# Patient Record
Sex: Male | Born: 1955 | Race: White | Hispanic: No
Health system: Midwestern US, Community
[De-identification: ages and names within clinical notes are randomized; demographics above are authoritative.]

## PROBLEM LIST (undated history)

## (undated) VITALS — BP 110/79 | HR 90 | Temp 97.80000°F | Resp 16 | Ht 68.74 in | Wt 191.1 lb

## (undated) DIAGNOSIS — J45909 Unspecified asthma, uncomplicated: Secondary | ICD-10-CM

## (undated) DIAGNOSIS — I2510 Atherosclerotic heart disease of native coronary artery without angina pectoris: Secondary | ICD-10-CM

## (undated) DIAGNOSIS — C801 Malignant (primary) neoplasm, unspecified: Secondary | ICD-10-CM

## (undated) DIAGNOSIS — I10 Essential (primary) hypertension: Secondary | ICD-10-CM

## (undated) DIAGNOSIS — M1990 Unspecified osteoarthritis, unspecified site: Secondary | ICD-10-CM

## (undated) DIAGNOSIS — C9000 Multiple myeloma not having achieved remission: Principal | ICD-10-CM

## (undated) DIAGNOSIS — J209 Acute bronchitis, unspecified: Secondary | ICD-10-CM

## (undated) DIAGNOSIS — R059 Cough, unspecified: Secondary | ICD-10-CM

## (undated) DIAGNOSIS — J449 Chronic obstructive pulmonary disease, unspecified: Principal | ICD-10-CM

## (undated) DIAGNOSIS — E859 Amyloidosis, unspecified: Secondary | ICD-10-CM

## (undated) DIAGNOSIS — J189 Pneumonia, unspecified organism: Secondary | ICD-10-CM

## (undated) DIAGNOSIS — C7951 Secondary malignant neoplasm of bone: Principal | ICD-10-CM

## (undated) DIAGNOSIS — M549 Dorsalgia, unspecified: Secondary | ICD-10-CM

## (undated) DIAGNOSIS — Z52011 Autologous donor, stem cells: Secondary | ICD-10-CM

## (undated) DIAGNOSIS — R0689 Other abnormalities of breathing: Secondary | ICD-10-CM

## (undated) DIAGNOSIS — R0600 Dyspnea, unspecified: Secondary | ICD-10-CM

## (undated) DIAGNOSIS — M25552 Pain in left hip: Secondary | ICD-10-CM

## (undated) DIAGNOSIS — R911 Solitary pulmonary nodule: Secondary | ICD-10-CM

---

## 2016-03-12 NOTE — Telephone Encounter (Addendum)
On 7.13.17 sent fax to Dr. Liliane Shi office for records.   Appt is this Monday, 7.17.17 w/Dr. Christene Lye.     **update**  Indexed records from Dr. Canary Brim and [facility].  Beta2 lab in [facility] is still in process.  Will check back on Monday Morning.     **update 7.17 @ 7:30am**    Indexed Beta2 lab.  Chart complete.

## 2016-03-12 NOTE — Telephone Encounter (Signed)
Np Dx Multiple Myeloma; Dr. Bernette Redbird [phone]; [employer] PPO; Verbal permission  for records, per Ainsley Spinner all records with PCP office & Dr. Remonia Richter location); appointment 03/16/16 9:15AM lab & a 9:30 AM Consult. [facility]h

## 2016-03-12 NOTE — Telephone Encounter (Signed)
New pt appt noted

## 2016-03-16 ENCOUNTER — Encounter: Admit: 2016-03-16 | Discharge: 2016-03-16 | Payer: BLUE CROSS/BLUE SHIELD | Primary: Hematology & Oncology

## 2016-03-16 ENCOUNTER — Ambulatory Visit
Admit: 2016-03-16 | Discharge: 2016-03-16 | Payer: BLUE CROSS/BLUE SHIELD | Attending: Hematology & Oncology | Primary: Hematology & Oncology

## 2016-03-16 ENCOUNTER — Encounter: Admit: 2016-03-16 | Primary: Hematology & Oncology

## 2016-03-16 ENCOUNTER — Inpatient Hospital Stay: Attending: Hematology & Oncology | Primary: Hematology & Oncology

## 2016-03-16 DIAGNOSIS — C9000 Multiple myeloma not having achieved remission: Secondary | ICD-10-CM

## 2016-03-16 LAB — CBC WITH AUTO DIFFERENTIAL
Granulocytes %: 62.4 % (ref 37.0–80.0)
Granulocytes: 4.6 10*3/uL (ref 2.0–7.8)
Hematocrit: 42.4 % — ABNORMAL LOW (ref 43.5–53.7)
Hemoglobin: 12.9 g/dL — ABNORMAL LOW (ref 14.1–18.1)
Lymphocyte %: 29.9 % (ref 10.0–50.0)
Lymphocytes: 2.2 10*3/uL (ref 0.6–4.1)
MCH: 28.7 pg (ref 27.0–31.2)
MCHC: 30.4 g/dL — ABNORMAL LOW (ref 31.8–35.4)
MCV: 94.4 fL (ref 80.0–97.0)
Mid Cells %: 7.7 % (ref 0.1–24.0)
Mid Cells: 0.6 10*3/uL (ref 0.0–1.8)
Platelets: 347 10*3/uL (ref 142–424)
RBC: 4.49 M/uL — ABNORMAL LOW (ref 4.69–6.13)
RDW: 13.9 % (ref 11.6–14.8)
WBC: 7.4 10*3/uL (ref 4.6–10.2)

## 2016-03-16 MED ORDER — LORAZEPAM 2 MG/ML IJ SOLN
2 MG/ML | Freq: Once | INTRAMUSCULAR | Status: AC
Start: 2016-03-16 — End: 2016-03-16
  Administered 2016-03-16: 15:00:00 1 mg via INTRAVENOUS

## 2016-03-16 MED ORDER — OXYCODONE-ACETAMINOPHEN 7.5-325 MG PO TABS
7.5-325 | ORAL_TABLET | ORAL | 0 refills | Status: DC | PRN
Start: 2016-03-16 — End: 2016-04-06

## 2016-03-16 MED ORDER — MORPHINE SULFATE ER 15 MG PO TBCR
15 MG | ORAL_TABLET | Freq: Two times a day (BID) | ORAL | 0 refills | Status: AC
Start: 2016-03-16 — End: 2016-04-15

## 2016-03-16 MED FILL — LORAZEPAM 2 MG/ML IJ SOLN: 2 MG/ML | INTRAMUSCULAR | Qty: 0.5

## 2016-03-16 NOTE — Progress Notes (Signed)
1123- Per dr Al Corpus pt needs 1 mg IV ativan before bone marrow. Pt states is very anxious. Iv placed in RAC, good blood return noted. Additional labs drawn. Ativan given through free flowing NS. Blood return satisfactory. IV left in place in case additional meds needed. Informed pt may cause drowsiness, verbalizes understanding   1211- IV removed without difficulty. Site unremarkable, dressing applied.     Patient Condition:  alert and oriented    Access Statements  An occlusive sterile dressing was applied after PIV established  IV fluid infuse well via pump    IV Push ended at 1123     Discharge [facility] discharge accompanied by Self and Spouse    Mode of transportation: Ambulatory    Clinic Discharge Note:  Treatment was admistered as prescribed without adverse event, with physician oversight.  Universal Precautions observed with treatment.  Patient instructed to call office with any questions or problems. Discharge Teaching instruction  Patient Physical Status adequate  Verify on flowchart all medications due today have been administered and documented.  Return appointment scheduled  Discharged from clinic

## 2016-03-16 NOTE — Patient Instructions (Signed)
Clinic Discharge Note     Clinic Discharge Note: Treatment was administered as prescribed without adverse event, with physician oversight.;Universal precautions observed with treatment;Patient instructed to call office with any questions or problems.  Discharge Teaching Instructions.;Verify on MAR all medications due today have been administered and documented on.;Return appointment scheduled;Discharged from clinic.    Patients Receiving  Treatment:  Report to your physician's office immediately or call  [phone]:              1.  Fever of 100.5 or greater.                2.  Vomiting without relief after using anti-nausea/vomiting medicine.                3.  Severe abdominal cramping/diarrhea, 3-5 watery bowel movements in a day.                4.  Pain at the IV site where you received your chemotherapy.                5.  Unusual or excessive bleeding from your mouth, nose, rectum, or vagina;  blood in your urine.                6.  A sudden onset of shortness of breath or chest pain.    Report to your physician's office within 24 hours:              1.  Pain not relieved by your usual pain medication.                2.  Signs of infection; redness, warmth, or swelling.                3.  Unusual bruising of any body area.                4.  Change in breathing patterns such as difficulty breathing or shortness of breath when you are resting.                5.  Change in urination; cloudiness, odor, frequency, or pain on urination.    Report to your physician's office within 48 hours:              1.  Skin changes: rash, hives, itching, redness and peeling of the skin.                2.  Numbness, tingling in the extremities or a persistent ringing in the ears.    Practice good personal hygiene.  If possible, avoid persons with colds or flu-like symptoms.  Drink plenty of fluids while awake and avoid exposure to the sun.  It is not unusual to experience other side effects, such as loss of appetite, altered  taste sensation or depression.

## 2016-03-16 NOTE — Progress Notes (Signed)
[facility] CONSULTATION     Date: 03/16/2016  Time: 8:05 PM    PCP: Donia Guiles, DO  Referring Provider: Mart Piggs, MD  Oncologist: Dr. Eulis Canner, M.D.; M.S.    Reason for Consult:   Lytic lesions in a pain    CHIEF COMPLAINT:     Back pain for 6 months.    HPI:     David Watkins is a 60 y.o. male here today for evaluation of newly discovered lytic lesions.   He presented with cough at the beginning of this year.  He also developed right lower rib pain.  He was later seen by his primary care physician.  Because of the persistent pain he was sent to Dr. Canary Brim.  He was given steroids.  His initial x-ray was reportedly negative.  However his cough did not go away and his pain got worse. CT of the chest at the [facility] on 03/10/2016 showed a 3 mm nodule in the periphery of the left lower lobe.  There is a 3.5 x 3 cm soft tissue mass with bony destruction involving posterior elements of the T6 vertebral body with extension into the thoracic canal with at least 50% narrowing of the diameter.  There are 2 lytic lesions within T11 vertebral body and the left pedicle with mild extent expansion in the less than 25% compromise of the thoracic canal.  There is a 2.3 x 2.3 destructive lesion in the inferolateral aspect of the scapula.  Right seventh and eighth ribs showed expansile lesions in the left 2, 3, 7 and 8 showed expansile lesions.  His pain is not relieved with Percocet.    HISTORY:     PAST MEDICAL HISTORY:  Past Medical History:   Diagnosis Date   ??? Multiple myeloma ([place])        PAST SURGICAL HISTORY:  Past Surgical History:   Procedure Laterality Date   ??? BONE MARROW BIOPSY         FAMILY HISTORY:  Family History   Problem Relation Age of Onset   ??? Heart Disease Mother    ??? Lung Cancer Father    ??? Heart Attack Brother        SOCIAL HISTORY:  History   Drug Use Not on file     History   Alcohol use Not on file     History   Sexual Activity   ??? Sexual activity: Not on file     History   Smoking Status   ??? Former  Smoker   ??? Quit date: 11/25/2012   Smokeless Tobacco   ??? Never Used       He works as a IT trainer.    ALLERGIES:     Allergies   Allergen Reactions   ??? Ceclor [Cefaclor] Hives       CURRENT MEDICATIONS:     Current Outpatient Prescriptions   Medication Sig Dispense Refill   ??? lisinopril (PRINIVIL;ZESTRIL) 10 MG tablet Take 10 mg by mouth daily     ??? atorvastatin (LIPITOR) 10 MG tablet Take 10 mg by mouth daily     ??? oxyCODONE-acetaminophen (PERCOCET) 7.5-325 MG per tablet Take 1 tablet by mouth every 4 hours as needed for Pain . 90 tablet 0   ??? morphine (MS CONTIN) 15 MG extended release tablet Take 1 tablet by mouth 2 times daily . 60 tablet 0     No current facility-administered medications for this visit.  PROBLEM LIST:     Patient Active Problem List   Diagnosis   ??? Multiple myeloma [facility])     Specialty Problems        Oncology Problems    Multiple myeloma ([facility])              REVIEW OF SYSTEMS:     CONSTITUTIONAL: Denies fever, sweats.  Some weight loss. Severe pain.      EYES: No visual changes or diplopia. No scleral icterus.  ENT: No Headaches, hearing loss or vertigo. No mouth sores or sore throat.  CARDIOVASCULAR: No chest pain, dyspnea on exertion, palpitations or loss of consciousness.   RESPIRATORY: No cough or wheezing, no sputum production. No hemoptysis.    GASTROINTESTINAL: No abdominal pain, appetite loss, blood in stools.  Mild constipation with use of Percocet.  GENITOURINARY: No dysuria, trouble voiding, or hematuria.  MUSCULOSKELETAL: no generalized weakness.  Mid back pain in the rib pain.  INTEGUMENTARY: No rash or pruritus.  NEUROLOGICAL: No headache, diplopia. No change in gait, balance, or coordination. No paresthesia.  ENDOCRINE: No temperature intolerance. No excessive thirst, fluid intake, or urination.   HEMATOLOGIC/LYMPHATIC: No abnormal bruising or ecchymosis, blood clots or swollen lymph nodes.  ALLERGIC/IMMUNOLOGIC: No nasal congestion or hives.     PHYSICAL EXAM:     BP  138/76   Pulse 82   Temp 97.8 ??F (36.6 ??C) (Oral)    Resp 16   Ht 5' 9" (1.753 m)   Wt 203 lb (92.1 kg)   BMI 29.98 kg/m2    WEIGHT:   Wt Readings from Last 3 Encounters:   03/16/16 203 lb (92.1 kg)        GENERAL APPEARANCE: Alert and Cooperative.  In pain.   HEAD: Normocephalic, without obvious abnormality, atraumatic  NECK: No palpable lymphadenopathy in supraclavicular, axillary or cervical chains  LUNGS: Clear to auscultation bilaterally, no audible rales, wheezes or crackles  HEART: Regular rate and rhythm, S1, S2 normal  ABDOMEN: Soft, non-tender; bowel sounds normal; no masses, no organomegaly  EXTREMITIES: without cyanosis, clubbing, edema or asymmetry   SKIN: No jaundice, purpura or petechiae  Tenderness on his mid back at T6 and the T11.  Also lower rib tenderness.  No focal neurologic deficit.    LABS:     CBC:  Lab Results   Component Value Date    WBC 7.4 03/16/2016    HGB 12.9 (L) 03/16/2016    HCT 42.4 (L) 03/16/2016    MCV 94.4 03/16/2016    PLT 347 03/16/2016    LABLYMP 2.2 03/16/2016    MID 0.6 03/16/2016    GRAN 4.6 03/16/2016    LYMPHOPCT 29.9 03/16/2016    MIDPERCENT 7.7 03/16/2016    GRANULOCYTES 62.4 03/16/2016    RBC 4.49 (L) 03/16/2016    MCH 28.7 03/16/2016    MCHC 30.4 (L) 03/16/2016    RDW 13.9 03/16/2016       Lab Results   Component Value Date    NA 133 03/16/2016    K 4.5 03/16/2016    CL 98 03/16/2016    CO2 29 03/16/2016    BUN 15 03/16/2016    CREATININE 1.2 03/16/2016    GLUCOSE 109 03/16/2016    CALCIUM 8.8 03/16/2016    PROT 7.7 03/16/2016    PROT 9.8 (H) 03/16/2016    LABALBU 3.6 03/16/2016    BILITOT 0.7 03/16/2016    ALKPHOS 82 03/16/2016    AST 42 (H) 03/16/2016  ALT 30 03/16/2016    LABGLOM >60.0 03/16/2016       No results found for: PTINR    TUMOR MARKERS:    Lab Results   Component Value Date    PSA 0.7 03/16/2016       IMAGING:     Xr Skeletal Survey Complete    Result Date: 03/16/2016  EXAMINATION: COMPLETE BONE SURVEY 03/16/2016 1:19 pm COMPARISON: None. HISTORY:  ORDERING PHYSICIAN PROVIDED HISTORY: Multiple myeloma, remission status unspecified ([place]) TECHNOLOGIST PROVIDED HISTORY: Technologist Provided Reason for Exam: Multiple myeloma, remission status unspecified ([place]) Acuity: Unknown Type of Encounter: Unknown FINDINGS: The following x-rays were obtained: AP, lateral skull, AP and lateral cervical spine, AP and lateral thoracic spine, frontal chest, AP right and left ribs, AP and lateral lumbar spine, AP pelvis, AP and lateral right and left femurs, AP and lateral right and left tibia fibula, AP right foot, AP left foot, AP right hand, AP left hand, AP right and left humerus, AP right and left forearm. Sinuses are clear.  Septum is midline.  No focal lucent or sclerotic skull lesions are identified.  Mild diffuse degenerative osteophyte formation is seen along the cervical, thoracic and lumbar spine.  Pedicles well visualized without focal lucent or sclerotic lesion identified.  Left 3rd rib fracture is demonstrated.  Left 8th rib demonstrates an expansile lucent lesion. Right 4th rib fracture is demonstrated.  There is a focal expansile lucent lesion involving the right scapular body.  Spondylolysis is noted at L5 with severe disc height loss at L5-S1.  Bones of the pelvis demonstrate no focal lucent or sclerotic lesions.  The femurs are intact.  Lower extremities are intact.  The feet demonstrate no lucent or sclerotic lesions.  The right proximal humerus demonstrates a subtle lucency which may represent a focal myeloma deposit.  Severe degenerative spurring is seen about the left elbow. The left humerus is intact.  Left and right forearms demonstrate no focal lucent or sclerotic lesions.  Left and right hands demonstrate no focal lucent or sclerotic lesions.  Clavicles demonstrate no focal lucent or sclerotic lesion.  Lucencies overlying the lumbar vertebral bodies are felt to be secondary to overlying bowel gas.     Expansile lucent lesions are identified involving  the left 3rd and 8th rib and the right scapula.  Left 3rd and right 4th rib fractures are seen. Subtle lucency in the right proximal humerus may represent a focal lesion as well.     CT of the chest at the [facility] on 03/10/2016 showed a 3 mm nodule in the periphery of the left lower lobe.  There is a 3.5 x 3 cm soft tissue mass with bony destruction involving posterior elements of the T6 vertebral body with extension into the thoracic canal with at least 50% narrowing of the diameter.  There are 2 lytic lesions within T11 vertebral body and the left pedicle with mild extent expansion in the less than 25% compromise of the thoracic canal.  There is a 2.3 x 2.3 destructive lesion in the inferolateral aspect of the scapula.  Right seventh and eighth ribs showed expansile lesions in the left 2, 3, 7 and 8 showed expansile lesions.      PATHOLOGY:     No results found for this or any previous visit.    STAGING:     No matching staging information was found for the patient.    ASSESSMENT & PLAN:     Lytic lesions involving T6, T11, ribs, and  the right scapula.  There is also subtle lucency in the right proximal humerus.    Intractable pain.    Normocytic and normochromic anemia.    Significantly elevated total protein level.    Labs including myeloma panel and PSA were obtained today.  His PSA turned out to be normal.    Explained to the patient and his wife David Watkins at great length about the findings.  The patient is very anxious.  We will add MS Contin.  He will continue Percocet as needed.  We discussed the bowel regimen.    Bone marrow aspiration and biopsy were performed at his right posterior iliac crest under local anesthesia and sterile condition.  Light sedation with Ativan was given for procedure.  The specimens were successfully obtained.  He tolerated procedure well without complications.    Because of the claustrophobia open MRI of his thoracic spine was scheduled for tomorrow.  Skeletal survey was done  today.  He will be scheduled to see radiation oncology.    We discussed the potential diagnosis and management.    I discussed with Dr. Dorris Carnes.    Thanks for this interesting referral.       Total length of this encounter = 80 minutes.    Eulis Canner, M.D.; M.S.  Medical Oncology/Hematology  Phone: [phone]  Fax: [phone]    [facility]  [address] [place] [address] [place] [address]    [place] [address]

## 2016-03-17 ENCOUNTER — Telehealth

## 2016-03-17 NOTE — Telephone Encounter (Signed)
David Watkins (Other) called to reschedule a MRI. Test is currently scheduled for today. Call was transferred to Test Scheduling. [facility] is at [facility] he couldn't do the mri still in parking lot calling to see if can reschedule or what options are.  Lost call.

## 2016-03-17 NOTE — Telephone Encounter (Signed)
tamika at [facility] called requesting records, needed today. Requesting clinicals to be faxed to [phone]. null, laura is receiving the records. tamika may be reached at [phone].

## 2016-03-17 NOTE — Telephone Encounter (Signed)
I spoke with David Watkins and explained that if he was unable to complete the MRI in an Open MRI that he may need to be sedated.  Message sent to the office to contact her ASAP

## 2016-03-17 NOTE — Telephone Encounter (Signed)
7/17 Consult faxed.

## 2016-03-17 NOTE — Telephone Encounter (Addendum)
Returned phone call to [employer].  [name] stated the patient was not able to do the MRI this morning due to fear of being claustrophobic.  [name] stated the only way the patient will be able to do this procedure is if he is sedated.    [facility].  Scheduled patient for IV sedation prior to MRI at [facility] tomorrow 7/19 at 10:30.  Faxed over last office note, MRI order and med list to [phone] as requested.    Returned phone call to [employer].  Instructed [name] of the plan of care.  Pt is to arrive 2 hours prior to his procedure.  He is to have nothing to eat or drink after midnight tonight.  [name] verbalized understanding.

## 2016-03-18 ENCOUNTER — Inpatient Hospital Stay: Admit: 2016-03-18 | Attending: Hematology & Oncology | Primary: Hematology & Oncology

## 2016-03-18 ENCOUNTER — Telehealth

## 2016-03-18 DIAGNOSIS — C9000 Multiple myeloma not having achieved remission: Secondary | ICD-10-CM

## 2016-03-18 MED ORDER — HYDROMORPHONE 0.5MG/0.5ML IJ SOLN
1 MG/ML | Status: DC | PRN
Start: 2016-03-18 — End: 2016-03-19

## 2016-03-18 MED ORDER — PROMETHAZINE HCL 25 MG/ML IJ SOLN
25 MG/ML | Freq: Once | INTRAMUSCULAR | Status: AC | PRN
Start: 2016-03-18 — End: 2016-03-18

## 2016-03-18 MED ORDER — LABETALOL HCL 5 MG/ML IV SOLN
5 MG/ML | INTRAVENOUS | Status: DC | PRN
Start: 2016-03-18 — End: 2016-03-19

## 2016-03-18 MED ORDER — DEXAMETHASONE 4 MG PO TABS
4 MG | ORAL_TABLET | Freq: Every day | ORAL | 0 refills | Status: DC
Start: 2016-03-18 — End: 2016-03-20

## 2016-03-18 MED ORDER — GADOVERSETAMIDE 330.9 MG/ML IV SOLN
330.9 MG/ML | Freq: Once | INTRAVENOUS | Status: AC | PRN
Start: 2016-03-18 — End: 2016-03-18
  Administered 2016-03-18: 16:00:00 18 mL via INTRAVENOUS

## 2016-03-18 MED ORDER — FENTANYL CITRATE (PF) 100 MCG/2ML IJ SOLN
100 MCG/2ML | INTRAMUSCULAR | Status: DC | PRN
Start: 2016-03-18 — End: 2016-03-19

## 2016-03-18 MED ORDER — FENTANYL CITRATE (PF) 100 MCG/2ML IJ SOLN
100 MCG/2ML | INTRAMUSCULAR | Status: DC | PRN
Start: 2016-03-18 — End: 2016-03-19
  Administered 2016-03-18: 17:00:00 50 ug via INTRAVENOUS

## 2016-03-18 NOTE — Progress Notes (Signed)
1.Verify patient.  2.Provide d/c instructions.  3.Understanding of d/c instructions.  4.Asess post-op  nausea , vomiting and pain.  5.Maintain core body temperature 96.8 or above.  6.Demonstrates post-op exercises.  7.Receive copy of d/c instructions.  8.Return to Pre anesthetic LOC/sensorimotor activity  9.IV d/c prior to d/c. Site unremarkable  10.Follow standard of care.

## 2016-03-18 NOTE — Progress Notes (Signed)
Medicated per CRNA for c/o rib and back pain

## 2016-03-18 NOTE — Progress Notes (Signed)
Up to chair and ambul. Tol po snack and drink well.

## 2016-03-18 NOTE — Progress Notes (Signed)
1.  Patient is identified using name and date of birth.  2.  The patient is free from signs and symptoms of injury.  3.  The patient receives appropriate medication(s), safely administered during the perioperative period.  4.  The patient had wound/tissuue perfusion consistent with or improved from baseline levels established preoperatively.  5.  The patient is at or returning to normothermia at the conclusion of the immediate postoperative period.  6.  The patient's fluid, electrolyte, and acid base balances are consistent with or improved from baseline levels established preoperatively.  7.  The patient's pulmonary function is consistent with or improved from baseline levels established preoperatively.  8.  The patient's cardiovascular status is consistent with or improved from baseline levels established preoperatively.  9.  The patient/caregiver participates in decisions affecting his or her perioperative care.  10.  The patient's care is consistent with the individualized perioperative plan of care.  11.  The patient's right to privacy is maintained.  12.  The patient is the recipient of competent and ethical care within legal standards of practice.  13.  The patient's value system, lifestyle, ethnicity, and culture are considered, respected, and incorporated in the perioperative plan of care.  14.  The patient demonstrates and/or reports adequate pain control throughout the perioperative period.  15.  The patient's neurological status is consistent with or improved from baseline levels established preoperatively.  16.  The patient/caregiver demonstrates knowledge of the expected responses to the operative or invasive procedure.  17.  Patient/caregiver has reduced anxiety.  Interventions - familiarize with environment and equipment.

## 2016-03-18 NOTE — Progress Notes (Signed)
Discharged to Phase 2

## 2016-03-18 NOTE — Telephone Encounter (Signed)
Called patient in regards to his MRI results.  Instructed the patient per Dr. Al Corpus that his results show multiple myeloma.  A new Rx for dexamethasone 44m x 4days will be called in to his pharmacy to help with the inflammation.  Scheduled patient for OCM this Friday [facility]20[facility]16 [facility] 8[facility]45[facility] LAmedeo KinsmanAPP.  Pt verbalized understanding.

## 2016-03-18 NOTE — Anesthesia Pre-Procedure Evaluation (Signed)
Department of Anesthesiology  Preprocedure Note       Name:  David Watkins   Age:  60 y.o.  DOB:  08/01/1956                                          MRN:  [id number]         Date:  03/18/2016        [facility] of Anesthesiology  Pre-Anesthesia Evaluation/Consultation       Name:  David Watkins                                         Age:  60 y.o.  MRN:  [id number]           Procedure (Scheduled):  MRI thoracic spine  Surgeon:  Dr. Wynetta Emery     Allergies   Allergen Reactions   ??? Ceclor [Cefaclor] Hives     Patient Active Problem List   Diagnosis   ??? Multiple myeloma (HCC)     Past Medical History:   Diagnosis Date   ??? Multiple myeloma ([place])      Past Surgical History:   Procedure Laterality Date   ??? BONE MARROW BIOPSY       Social History   Substance Use Topics   ??? Smoking status: Former Smoker     Quit date: 11/25/2012   ??? Smokeless tobacco: Never Used   ??? Alcohol use None     Medications  Current Outpatient Prescriptions on File Prior to Encounter   Medication Sig Dispense Refill   ??? lisinopril (PRINIVIL;ZESTRIL) 10 MG tablet Take 10 mg by mouth daily     ??? atorvastatin (LIPITOR) 10 MG tablet Take 10 mg by mouth daily     ??? oxyCODONE-acetaminophen (PERCOCET) 7.5-325 MG per tablet Take 1 tablet by mouth every 4 hours as needed for Pain . 90 tablet 0   ??? morphine (MS CONTIN) 15 MG extended release tablet Take 1 tablet by mouth 2 times daily . 60 tablet 0     No current facility-administered medications on file prior to encounter.      Current Outpatient Prescriptions   Medication Sig Dispense Refill   ??? aspirin 81 MG EC tablet Take 81 mg by mouth daily     ??? lisinopril (PRINIVIL;ZESTRIL) 10 MG tablet Take 10 mg by mouth daily     ??? atorvastatin (LIPITOR) 10 MG tablet Take 10 mg by mouth daily     ??? oxyCODONE-acetaminophen (PERCOCET) 7.5-325 MG per tablet Take 1 tablet by mouth every 4 hours as needed for Pain . 90 tablet 0   ??? morphine (MS CONTIN) 15 MG extended release tablet Take 1 tablet by mouth 2 times daily  . 60 tablet 0     No current facility-administered medications for this encounter.      Vital Signs (Current)   Vitals:    03/18/16 0939   BP: (!) 152/85   Pulse: 66   Resp: 18   Temp: 97.1 ??F (36.2 ??C)   SpO2: 98%     Vital Signs Statistics (for past 48 hrs)     BP  Min: 152/85   Min taken time: 03/18/16 0939  Max: 152/85   Max taken time: 03/18/16 0939  Temp  Avg: 97.1 ??F (  36.2 ??C)  Min: 97.1 ??F (36.2 ??C)   Min taken time: 03/18/16 0939  Max: 97.1 ??F (36.2 ??C)   Max taken time: 03/18/16 0939  Pulse  Avg: 66  Min: 56   Min taken time: 03/18/16 0939  Max: 83   Max taken time: 03/18/16 0939  Resp  Avg: 18  Min: 74   Min taken time: 03/18/16 0939  Max: 18   Max taken time: 03/18/16 0939  SpO2  Avg: 98 %  Min: 98 %   Min taken time: 03/18/16 0939  Max: 98 %   Max taken time: 03/18/16 0939    BP Readings from Last 3 Encounters:   03/18/16 (!) 152/85   03/16/16 138/76     BMI  Body mass index is 29.59 kg/(m^2).  Estimated body mass index is 29.59 kg/(m^2) as calculated from the following:    Height as of this encounter: 5' 9"  (1.753 m).    Weight as of this encounter: 200 lb 6.4 oz (90.9 kg).    CBC   Lab Results   Component Value Date    WBC 7.4 03/16/2016    RBC 4.49 03/16/2016    HGB 12.9 03/16/2016    HCT 42.4 03/16/2016    MCV 94.4 03/16/2016    RDW 13.9 03/16/2016    PLT 347 03/16/2016     CMP    Lab Results   Component Value Date    NA 133 03/16/2016    K 4.5 03/16/2016    CL 98 03/16/2016    CO2 29 03/16/2016    BUN 15 03/16/2016    CREATININE 1.2 03/16/2016    LABGLOM >60.0 03/16/2016    GLUCOSE 109 03/16/2016    PROT 7.7 03/16/2016    PROT 9.8 03/16/2016    CALCIUM 8.8 03/16/2016    BILITOT 0.7 03/16/2016    ALKPHOS 82 03/16/2016    AST 42 03/16/2016    ALT 30 03/16/2016     BMP    Lab Results   Component Value Date    NA 133 03/16/2016    K 4.5 03/16/2016    CL 98 03/16/2016    CO2 29 03/16/2016    BUN 15 03/16/2016    CREATININE 1.2 03/16/2016    CALCIUM 8.8 03/16/2016    LABGLOM >60.0 03/16/2016    GLUCOSE  109 03/16/2016     POCGlucose  Recent Labs      03/16/16   1106   GLUCOSE  109      Coags  No results found for: PROTIME, INR, APTT  HCG (If Applicable) No results found for: PREGTESTUR, PREGSERUM, HCG, HCGQUANT   ABGs No results found for: PHART, PO2ART, PCO2ART, HCO3ART, BEART, O2SATART   Type & Screen (If Applicable)  No results found for: LABABO, LABRH      NPO Status: Time of last liquid consumption: 0000                        Time of last solid consumption: 0000                        Date of last liquid consumption: 03/17/16                        Date of last solid food consumption: 03/17/16    BMI:   Wt Readings from Last 3 Encounters:   03/18/16 200 lb 6.4 oz (  90.9 kg)   03/16/16 203 lb (92.1 kg)     Body mass index is 29.59 kg/(m^2).    Anesthesia Evaluation  Patient summary reviewed no history of anesthetic complications:   Airway: Mallampati: II  TM distance: >3 FB   Neck ROM: full  Mouth opening: > = 3 FB Dental: normal exam         Pulmonary:negative ROS and normal exam           Cardiovascular:  Exercise tolerance: good (>4 METS),   (+) hypertension:, hyperlipidemia    (-) past MI, CABG/stent, dysrhythmias and  angina      Rhythm: regular  Rate: normal                 Neuro/Psych:   (+) psychiatric history (extremely anxious):   GI/Hepatic/Renal:        (-) GERD and liver disease     Endo/Other:        (-) hypothyroidism       Comments: Multiple myeloma Abdominal:                    Anesthesia Plan    ASA 3     MAC and TIVA     Anesthetic plan and risks discussed with patient.    Plan discussed with CRNA.      This pre-anesthesia assessment may be used as a history and physical.    DOS STAFF ADDENDUM:    Pt seen and examined, chart reviewed (including anesthesia, drug and allergy history).  No interval changes to history and physical examination.  Anesthetic plan, risks, benefits, alternatives, and personnel involved discussed with patient.  Patient verbalized an understanding and agrees to proceed.       Lynne Leader, MD  March 18, 2016  10:07 AM  \\

## 2016-03-18 NOTE — Progress Notes (Signed)
To PACU from MRI, tearful and restless

## 2016-03-18 NOTE — Progress Notes (Signed)
Fentanyl 42mcg ivp for back and rib pain

## 2016-03-19 ENCOUNTER — Encounter

## 2016-03-19 MED ORDER — LENALIDOMIDE 25 MG PO CAPS
25 MG | ORAL_CAPSULE | Freq: Every day | ORAL | 0 refills | Status: DC
Start: 2016-03-19 — End: 2016-03-19

## 2016-03-19 MED ORDER — LENALIDOMIDE 25 MG PO CAPS
25 | ORAL_CAPSULE | Freq: Every day | ORAL | 0 refills | Status: DC
Start: 2016-03-19 — End: 2016-03-20

## 2016-03-19 MED FILL — FRESENIUS PROPOVEN 200 MG/20ML IV EMUL: 200 MG/20ML | INTRAVENOUS | Qty: 80

## 2016-03-19 MED FILL — MIDAZOLAM HCL 5 MG/5ML IJ SOLN: 5 MG/ML | INTRAMUSCULAR | Qty: 5

## 2016-03-19 NOTE — Progress Notes (Signed)
RPh review per policy.  Bortezomib/Lenalidomide/Dexamethasone and Zometa plans appropriate for diagnosis.  Reviewed doses, meds, allergies and labs.

## 2016-03-19 NOTE — Addendum Note (Signed)
Addended by: Donia Ast on: 03/19/2016 04:58 PM     Modules accepted: Orders

## 2016-03-20 ENCOUNTER — Encounter

## 2016-03-20 ENCOUNTER — Ambulatory Visit
Admit: 2016-03-20 | Discharge: 2016-03-20 | Payer: BLUE CROSS/BLUE SHIELD | Attending: Nurse Practitioner | Primary: Hematology & Oncology

## 2016-03-20 ENCOUNTER — Ambulatory Visit
Admit: 2016-03-20 | Discharge: 2016-03-20 | Payer: BLUE CROSS/BLUE SHIELD | Attending: Radiation Oncology | Primary: Hematology & Oncology

## 2016-03-20 DIAGNOSIS — C9000 Multiple myeloma not having achieved remission: Secondary | ICD-10-CM

## 2016-03-20 LAB — FLOW CYTOMETRY

## 2016-03-20 LAB — PLASMA CELL PANEL

## 2016-03-20 LAB — FISH PANEL- MULTIPLE MYELOMA

## 2016-03-20 LAB — CHROMOSOME ANALYSIS BLOOD OR BONE MARROW

## 2016-03-20 LAB — PSA: PSA: 0.7 ng/mL (ref <=4.0)

## 2016-03-20 LAB — BONE MARROW

## 2016-03-20 LAB — SUPPLEMENTAL REPORT

## 2016-03-20 MED ORDER — ONDANSETRON HCL 8 MG PO TABS
8 MG | ORAL_TABLET | ORAL | 1 refills | Status: DC
Start: 2016-03-20 — End: 2016-09-16

## 2016-03-20 MED ORDER — LORAZEPAM 1 MG PO TABS
1 MG | ORAL_TABLET | Freq: Three times a day (TID) | ORAL | 0 refills | Status: DC | PRN
Start: 2016-03-20 — End: 2016-04-03

## 2016-03-20 MED ORDER — PROCHLORPERAZINE MALEATE 10 MG PO TABS
10 MG | ORAL_TABLET | Freq: Four times a day (QID) | ORAL | 3 refills | Status: DC | PRN
Start: 2016-03-20 — End: 2016-12-15

## 2016-03-20 MED ORDER — DEXAMETHASONE 4 MG PO TABS
4 | ORAL_TABLET | ORAL | 0 refills | Status: DC
Start: 2016-03-20 — End: 2016-08-26

## 2016-03-20 MED ORDER — LENALIDOMIDE 25 MG PO CAPS
25 | ORAL_CAPSULE | Freq: Every day | ORAL | 0 refills | Status: DC
Start: 2016-03-20 — End: 2016-04-08

## 2016-03-20 MED ORDER — OMEPRAZOLE 20 MG PO CPDR
20 MG | ORAL_CAPSULE | Freq: Every day | ORAL | 3 refills | Status: DC
Start: 2016-03-20 — End: 2016-03-20

## 2016-03-20 NOTE — Progress Notes (Signed)
[facility] ??? [facility] Appointment        Patient: David Watkins  Date: 03/20/2016    Primary Oncologist: Eulis Canner, MD    REASON FOR VISIT     David Watkins is a 60 y.o. male that presents today for a cancer care planning visit for his diagnosis of multiple myeloma. His physician, Eulis Canner, MD, has ordered Velcade, Revlimid, dexamethosone, Zometa.     EXAM / INTERVAL HISTORY     David Watkins has no new symptoms. He continues to have back pain, steroids helping some.  He continues on MS Contin and percocet.       BP 120/80   Pulse 72   Temp 97.4 ??F (36.3 ??C) (Oral)    Ht 5' 9"  (1.753 m)   Wt 206 lb (93.4 kg)   BMI 30.42 kg/m2    LABS     CBC:  Lab Results   Component Value Date    WBC 7.4 03/16/2016    HGB 12.9 03/16/2016    HCT 42.4 03/16/2016    MCV 94.4 03/16/2016    PLT 347 03/16/2016       BMP:   Lab Results   Component Value Date    NA 133 03/16/2016    K 4.5 03/16/2016    CL 98 03/16/2016    CO2 29 03/16/2016    BUN 15 03/16/2016    CREATININE 1.2 03/16/2016    GLUCOSE 109 03/16/2016    CALCIUM 8.8 03/16/2016       HEPATIC FUNCTION PANEL:   Lab Results   Component Value Date    ALKPHOS 82 03/16/2016    ALT 30 03/16/2016    AST 42 03/16/2016    PROT 7.7 03/16/2016    PROT 9.8 03/16/2016    BILITOT 0.7 03/16/2016    LABALBU 3.6 03/16/2016         TUMOR MARKERS:    Lab Results   Component Value Date    PSA 0.7 03/16/2016       DOCUMENTATION     The following areas were reviewed with the patient today:    Pain Management Care Plan: Yes  Medication Reconciliation: Yes  Distress Tool: Yes  Depression Screening: Yes  Advanced Care Planning: Yes  Discussed Local Resources: Yes  Treatment Summary Care Plan: Yes    PLAN     David Watkins will proceed with the treatment Velcade (SQ days 1,4,8,11, then off one week), Revlimid (daily for 2 weeks on, one week off), Decadron, and Zometa (monthly).    Compazine prescribed for nausea    Pt has appointment today for consult with radiation oncologist for radiation for pain control.        David Watkins should return to the office in one week for MD f/u and tx.    Today, time spent with pt discussing the intent and schedule of treatment. Pt able to speak with our Psychologist, forensic. The patient was given a copy of their treatment planning paperwork. Questions and concerns were addressed with the patient. Time spent with the pt face to face today was 40 mins, counseling and coordination of care dominated more than 50% of this APP/patient encounter.     Please refer to the Treatment Planning Summary for additional documentation.       Amedeo Kinsman, NP  Phone: [phone]

## 2016-03-20 NOTE — Progress Notes (Signed)
Patient simulated  today.  Consent signed and teaching packet provided for disease specific and common radiation therapy side effect management.  Radiation therapist provided treatment appointment schedule.  Pt verbalized understanding and is agreeable with plan.

## 2016-03-20 NOTE — Progress Notes (Signed)
RADIATION/ONCOLOGY FOLLOW-UP      Patient:  David Watkins  Date of Birth:  03/28/1956    03/20/2016    Primary Oncologist:  Arrie Senate, MD  PCP:  Donia Guiles, DO      CHIEF COMPLAINT:  Follow up discuss and simulation.     Chief Complaint   Patient presents with   ??? Follow-up   ??? Multiple Myeloma         PROBLEM LIST:       Multiple myeloma with several areas of cord compression and multiple painful bony metastasis.    INTERVAL HISTORY:       David Watkins is a 60 y.o. male who presents today from Dr. Maceo Pro (seen at [facility])  for simulation and urgent radiation for his multiple myeloma.  He has had pain in his ribs and back which has been escalating for months.  This finally led to a CAT scan done at [facility] which showed soft tissue metastasis and a right paravertebral location at T6.  Multiple other bony metastasis were noted on chest CT.  MRI scan was performed as well.I have reviewed the MRI report in the actual images.    The patient can ambulate.  He has been on 40 mg of Decadron without pain relief.  His biggest issue is pain in the back especially in the thoracic spine in the lumbar spine area as well as pain in the right scapula.  He denies paresthesias.  He denies loss of bowel or bladder control.  He can walk independently.    He has been on a combination of MS Contin and Percocet without relief.    He is at [place] today for simulation and urgent radiation.       Patient's medications, allergies, past medical, surgical, social and family histories were reviewed and updated as appropriate.           PHYSICAL EXAM:       There were no vitals taken for this visit.    General appearance: alert and cooperative. In pain. Hard to lie supine or sit.  Head: Normocephalic, without obvious abnormality, atraumatic  Neck: No palpable lymphadenopathy in supraclavicular or cervical chains  Lungs: Clear to auscultation bilaterally, no audible rales, wheezes or crackles  Heart: Regular rate and rhythm  Abdomen: Soft,  non-tender; bowel sounds normal; no masses,  no organomegaly  Extremities: without cyanosis, clubbing, edema or asymmetry  Skin: No jaundice, purpura or petechiae      LABS:     CBC:   Lab Results   Component Value Date    WBC 7.4 03/16/2016    HGB 12.9 03/16/2016    MCV 94.4 03/16/2016    PLT 347 03/16/2016       BMP:   Lab Results   Component Value Date    NA 133 03/16/2016    K 4.5 03/16/2016    CO2 29 03/16/2016    BUN 15 03/16/2016    CREATININE 1.2 03/16/2016       HEPATIC:  Lab Results   Component Value Date    AST 42 03/16/2016    ALT 30 03/16/2016    ALKPHOS 82 03/16/2016    PROT 7.7 03/16/2016    PROT 9.8 03/16/2016    BILITOT 0.7 03/16/2016       TUMOR MARKERS:   Lab Results   Component Value Date    PSA 0.7 03/16/2016       IMAGING:     FINDINGS:   BONES/ALIGNMENT:  There is normal alignment of the thoracic spine.   ??   There are enhancing, T2 hyperintense, expansile masses noted in the right T6   posterior elements which result in mass effect on the right side of the   thoracic spinal cord and results in at least moderate central spinal canal   narrowing. ??This results in right T5-T6, and T6-T7 foraminal narrowing.   ??   Additionally, there is an enhancing mass centered in the left T11 pedicle,   resulting in foraminal narrowing at T10-T11, and T11-T12 on the left.   ??   Additionally, there are enhancing lesions noted along the left T7 transverse   process and pedicle, and along the left T8-T9 inferior endplates. ??There is a   smaller enhancing lesion noted along the right T11 pedicle and superior   articular process. ??There is a mildly enhancing lesion noted along the   anterior aspect of the T11 vertebral body, compatible with metastatic disease   versus myeloma. ??There is also an enhancing lesion noted along the L1   superior endplate.   ??   There are mildly enhancing lesions noted in the T2 and T3 vertebral bodies.   ??   There is no pathologic fracture.   ??   SPINAL CORD: The thoracic spinal cord  demonstrates normal signal intensity.   There is no evidence of an expansile mass lesion.   ??   SOFT TISSUES: ??Gravity dependent atelectasis is noted in the lungs. ??No   paraspinal masses are identified.   ??   DEGENERATIVE CHANGES: There is mild multilevel degenerative disc disease   noted in the thoracic spine. ??There is central spinal canal narrowing at   T5-T6, and to a lesser extent at the level of the T11 vertebral body.   ??   ??   Impression   1. There are multifocal T2 hyperintense, enhancing lesions noted throughout   the thoracic spine and the L1 vertebral body which are concerning for   metastatic disease versus myeloma. ??The largest lesions are noted along the   right T6 posterior elements which result in moderate central spinal canal   narrowing, and as well as along the left T11 pedicle, resulting in mild   central spinal canal narrowing. ??These are consistent with the abnormalities   noted on the recent CT of the chest.   2. No pathologic fracture is identified.   ??   ??   Order History         LABS:     The patient's pertinent lab results were reviewed by me.    ASSESSMENT      Multiple myeloma with significant disease in the thoracic spine.  Spinal cord is intact but there is significant narrowing of the spinal canal.  The patient is in extreme pain but there are no other neurological signs or symptoms.    PLAN:       Today I met with the patient and his wife.  I will plan on treating the T5-6 and 7 area as well as a T11 through L3 area in addition to the right scapula which is very painful.  The goals of treatment are palliative.  3000 cGy in 10 fractions is the planned dose.  Dr. Maceo Pro had previously discussed side effects.    Pain medications were reviewed.  He is only on 4 mg of Decadron until tomorrow tomorrow and then I am going to start him at a dose of Decadron at 4 mg p.o.  twice daily.  He will also start an H2 blocker.  He is very anxious and we did add some Ativan to his routine as well as  Zofran to take 8 mg an hour before treatment.    He was simulated today.  We will also begin urgent radiation today and treat him tomorrow.      E Kieryn Burtis,MD    The visit lasted 60 minutes and at least 50% of the time spent with David Watkins was in counsel and education.     Arrie Senate, MD           SIM NOTE-    Patient taken to the CT simulator to be set up for palliative radiation to the spine specifically the T5 through T7 area inclusive as well as to the T11 through L3 area inclusive the right scapula will also be treated.  Because of the patient's severe pain this was a difficult simulation.  We could not put him on a weaning board.  We placed him supine.  Thin cuts through the above areas were set up and scanned.  Appropriate tumor volumes were contoured.  3D planning will be performed on each of these 3 sites.  MLC these will be used to block normal tissue.  The planned dose is 3000 cGy in 10 fractions urgent radiation will be initiated today    EL

## 2016-03-20 NOTE — Telephone Encounter (Signed)
ref by dr Al Corpus for mets to spine tl  New patient. no previous radiation. appointment scheduled by office. [employer].appointment 03/20/16@10 :15/10:30.

## 2016-03-20 NOTE — Progress Notes (Signed)
RADIATION ONCOLOGY CONSULTATION     Patient:  David Watkins  Date of Birth:  1956/04/18    03/20/2016    PROVIDERS:  PCP:  Donia Guiles, DO    REFERRING PROVIDER:  Eulis Canner, MD    REASON FOR CONSULT: multiple myeloma with spinal cord compression     CHIEF COMPLAINT:     Chief Complaint   Patient presents with   ??? Cancer       HISTORY OF PRESENT ILLNESS:     HPI:     David Watkins is a 60 y.o. male that presents today for His multiple myeloma.  Patient was having back pain since February 2017 a variety of regimens were tried and MRI revealed multiple lytic lesions consistent with myeloma myeloma was confirmed he is now seen for consideration of paly radiation therapy.     PAST MEDICAL HISTORY:     Past Medical History:   Diagnosis Date   ??? Hyperlipidemia    ??? Hypertension    ??? Multiple myeloma ([place])        PAST SURGICAL HISTORY:     Past Surgical History:   Procedure Laterality Date   ??? BONE MARROW BIOPSY         SOCIAL HISTORY:     Social History     Social History   ??? Marital status: Married     Spouse name: N/A   ??? Number of children: N/A   ??? Years of education: N/A     Occupational History   ??? Not on file.     Social History Main Topics   ??? Smoking status: Former Smoker     Quit date: 11/25/2012   ??? Smokeless tobacco: Never Used   ??? Alcohol use Not on file   ??? Drug use: Not on file   ??? Sexual activity: Not on file     Other Topics Concern   ??? Not on file     Social History Narrative       FAMILY HISTORY:     Family History   Problem Relation Age of Onset   ??? Heart Disease Mother    ??? Lung Cancer Father    ??? Heart Attack Brother        ALLERGIES:     Allergies as of 03/20/2016 - Review Complete 03/20/2016   Allergen Reaction Noted   ??? Ceclor [cefaclor] Hives 03/16/2016       MEDICATIONS:     Current Outpatient Prescriptions on File Prior to Visit   Medication Sig Dispense Refill   ??? prochlorperazine (COMPAZINE) 10 MG tablet Take 1 tablet by mouth every 6 hours as needed (nausea) 120 tablet 3   ??? lenalidomide  (REVLIMID) 25 MG chemo capsule Take 1 capsule by mouth daily Take 2 weeks on and one week off. Adult Male auth# [id number] 14 capsule 0   ??? aspirin 81 MG EC tablet Take 81 mg by mouth daily     ??? dexamethasone (DECADRON) 4 MG tablet Take 10 tablets by mouth daily for 4 days 40 tablet 0   ??? lisinopril (PRINIVIL;ZESTRIL) 10 MG tablet Take 10 mg by mouth daily     ??? atorvastatin (LIPITOR) 10 MG tablet Take 10 mg by mouth daily     ??? oxyCODONE-acetaminophen (PERCOCET) 7.5-325 MG per tablet Take 1 tablet by mouth every 4 hours as needed for Pain . 90 tablet 0   ??? morphine (MS CONTIN) 15 MG extended release tablet Take 1 tablet by  mouth 2 times daily . 60 tablet 0     No current facility-administered medications on file prior to visit.        REVIEW OF SYSTEMS:       Constitutional:  Generally feels well But lots of back pain   Appetite good.  Energy level stable.    HEENT:  No hoarseness or dysphagia.    Cardiovascular:  No chest pain or palpitations.    Respiratory:  No cough or SOB   GI: Straining for both the rectum and bladder    GU:  No urinary frequency, dysuria or urgency.    Musculoskeletal:   No specific bone pain     Neurological:  Back pain leg weakness decreased sensation in the lower extremity  Skin: No rashes or itching issues  Endocrine: Denies night sweats or thirst  Lymph: Denies lower extremity or upper extremity swelling     PHYSICAL EXAM:       Vitals:    03/20/16 1040   BP: 120/70   Pulse: 68   Temp: 97.5 ??F (36.4 ??C)       General appearance: alert and cooperative  Head: Normocephalic, without obvious abnormality, atraumatic  Neck: No palpable lymphadenopathy in supraclavicular or axillary chains  Lungs: Clear to auscultation bilaterally, no wheezes or rales  Heart: Regular rate and rhythm.  No murmurs noted.  Abdomen: Soft, non-tender; no masses,  no organomegaly  Extremities: without clubbing, edema or asymmetry  Skin: No jaundice or purpura Neurologic findings are consistent with a lesion at the T6  level there is a sensory level there is hyperreflexia anemia of the patella there is decreased strength in the lower extremities      IMAGING:     Mri Thoracic Spine W Wo Contrast  I have personally reviewed the MRI with the patient and his family I pointed out the lesions that are causing his symptoms    Result Date: 03/18/2016  EXAMINATION: MRI OF THE THORACIC SPINE WITHOUT AND WITH CONTRAST  03/18/2016 11:48 am TECHNIQUE: Multiplanar multisequence MRI of the thoracic spine was performed without and with the administration of intravenous contrast. COMPARISON: Chest CT 03/10/2016. HISTORY: ORDERING PHYSICIAN PROVIDED HISTORY: Multiple myeloma, remission status unspecified ([place]) TECHNOLOGIST PROVIDED HISTORY: Technologist Provided Reason for Exam: Multiple myeloma, remission status unspecified Acuity: Acute Type of Encounter: Subsequent/Follow-up skeletal survey Additional signs and symptoms: severe back pain Relevant Medical/Surgical History: Mr. Cappella is a 60 y.o. male here today for evaluation of newly discovered lytic lesions.   He presented with cough at the beginning of this year.  He also developed right lower rib pain.  He was later seen by his primary care physician.  Because of the persistent pain he was sent to Dr. Canary Brim.  He was given steroids.  His initial x-ray was reportedly negative.  However his cough did not go away and his pain got worse. CT of the chest at the [facility] on 03/10/2016 showed a 3 mm nodule in the periphery of the left lower lobe.  There is a 3.5 x 3 cm soft tissue mass with bony destruction involving posterior elements of the T6 vertebral body with extension into the thoracic canal with at least 50% narrowing of the diameter.  There are 2 lytic lesions within T11 vertebral body and the left pedicle with mild extent expansion in the less than 25% compromise of the thoracic canal.  There is a 2.3 x 2.3 destructive lesion in the inferolateral aspect of the scapula.  Right seventh  and  eighth ribs showed expansile lesions in the left 2, 3, 7 and 8 showed expansile lesions.  His pain is not relieved with Percocet. FINDINGS: BONES/ALIGNMENT: There is normal alignment of the thoracic spine. There are enhancing, T2 hyperintense, expansile masses noted in the right T6 posterior elements which result in mass effect on the right side of the thoracic spinal cord and results in at least moderate central spinal canal narrowing.  This results in right T5-T6, and T6-T7 foraminal narrowing. Additionally, there is an enhancing mass centered in the left T11 pedicle, resulting in foraminal narrowing at T10-T11, and T11-T12 on the left. Additionally, there are enhancing lesions noted along the left T7 transverse process and pedicle, and along the left T8-T9 inferior endplates.  There is a smaller enhancing lesion noted along the right T11 pedicle and superior articular process.  There is a mildly enhancing lesion noted along the anterior aspect of the T11 vertebral body, compatible with metastatic disease versus myeloma.  There is also an enhancing lesion noted along the L1 superior endplate. There are mildly enhancing lesions noted in the T2 and T3 vertebral bodies. There is no pathologic fracture. SPINAL CORD: The thoracic spinal cord demonstrates normal signal intensity. There is no evidence of an expansile mass lesion. SOFT TISSUES:  Gravity dependent atelectasis is noted in the lungs.  No paraspinal masses are identified. DEGENERATIVE CHANGES: There is mild multilevel degenerative disc disease noted in the thoracic spine.  There is central spinal canal narrowing at T5-T6, and to a lesser extent at the level of the T11 vertebral body.     1. There are multifocal T2 hyperintense, enhancing lesions noted throughout the thoracic spine and the L1 vertebral body which are concerning for metastatic disease versus myeloma.  The largest lesions are noted along the right T6 posterior elements which result in moderate  central spinal canal narrowing, and as well as along the left T11 pedicle, resulting in mild central spinal canal narrowing.  These are consistent with the abnormalities noted on the recent CT of the chest. 2. No pathologic fracture is identified.     Xr Skeletal Survey Complete    Result Date: 03/16/2016  EXAMINATION: COMPLETE BONE SURVEY 03/16/2016 1:19 pm COMPARISON: None. HISTORY: ORDERING PHYSICIAN PROVIDED HISTORY: Multiple myeloma, remission status unspecified ([place]) TECHNOLOGIST PROVIDED HISTORY: Technologist Provided Reason for Exam: Multiple myeloma, remission status unspecified ([place]) Acuity: Unknown Type of Encounter: Unknown FINDINGS: The following x-rays were obtained: AP, lateral skull, AP and lateral cervical spine, AP and lateral thoracic spine, frontal chest, AP right and left ribs, AP and lateral lumbar spine, AP pelvis, AP and lateral right and left femurs, AP and lateral right and left tibia fibula, AP right foot, AP left foot, AP right hand, AP left hand, AP right and left humerus, AP right and left forearm. Sinuses are clear.  Septum is midline.  No focal lucent or sclerotic skull lesions are identified.  Mild diffuse degenerative osteophyte formation is seen along the cervical, thoracic and lumbar spine.  Pedicles well visualized without focal lucent or sclerotic lesion identified.  Left 3rd rib fracture is demonstrated.  Left 8th rib demonstrates an expansile lucent lesion. Right 4th rib fracture is demonstrated.  There is a focal expansile lucent lesion involving the right scapular body.  Spondylolysis is noted at L5 with severe disc height loss at L5-S1.  Bones of the pelvis demonstrate no focal lucent or sclerotic lesions.  The femurs are intact.  Lower extremities are intact.  The feet  demonstrate no lucent or sclerotic lesions.  The right proximal humerus demonstrates a subtle lucency which may represent a focal myeloma deposit.  Severe degenerative spurring is seen about the left elbow.  The left humerus is intact.  Left and right forearms demonstrate no focal lucent or sclerotic lesions.  Left and right hands demonstrate no focal lucent or sclerotic lesions.  Clavicles demonstrate no focal lucent or sclerotic lesion.  Lucencies overlying the lumbar vertebral bodies are felt to be secondary to overlying bowel gas.     Expansile lucent lesions are identified involving the left 3rd and 8th rib and the right scapula.  Left 3rd and right 4th rib fractures are seen. Subtle lucency in the right proximal humerus may represent a focal lesion as well.       STAGING:       ASSESSMENT:     Patient suffers from multiple myeloma that is manifesting self the spinal cord compression with lesions in the T5-T6 area as well as the thoracic 11th vertebra compression is apparent on MRI compression is apparent on clinical findings patient will start emergent radiation therapy today he will be simulated at [facility] with emergent treatment being delivered today and over the weekend    PLAN:        The patient will be simulated  Today .  A dose of  2600  cGy  in intal 400   cGy fractions and then 300 cgy is planned.Possible and expected side effects of radiation were discussed today at length with the patient.  The logistics involved with daily radiation were also discussed.  The patient wishes to proceed with therapy.       Orders placed during this encounter:  Ct sim    Intent of treatment: Palliative      Harless Litten, MD

## 2016-03-20 NOTE — Telephone Encounter (Signed)
Noted  

## 2016-03-20 NOTE — Progress Notes (Signed)
Velcade, Revlimid and Dexamethasone information given and discussed with David Watkins. All questions answered and David Watkins verbalized understanding. Consent signed.

## 2016-03-23 ENCOUNTER — Ambulatory Visit
Admit: 2016-03-23 | Discharge: 2016-03-23 | Payer: BLUE CROSS/BLUE SHIELD | Attending: Hematology & Oncology | Primary: Hematology & Oncology

## 2016-03-23 ENCOUNTER — Encounter: Admit: 2016-03-23 | Discharge: 2016-03-23 | Payer: BLUE CROSS/BLUE SHIELD | Primary: Hematology & Oncology

## 2016-03-23 DIAGNOSIS — C9000 Multiple myeloma not having achieved remission: Secondary | ICD-10-CM

## 2016-03-23 LAB — COMPREHENSIVE METABOLIC PANEL
ALT: 30 U/L (ref 10–47)
AST: 42 U/L — ABNORMAL HIGH (ref 11–38)
Albumin: 3.6 G/DL (ref 3.3–5.5)
Alk Phosphatase: 82 U/L (ref 53–128)
BUN: 15 MG/DL (ref 7–22)
CO2: 29 mmol/L (ref 18–33)
Calcium: 8.8 MG/DL (ref 8.0–10.3)
Chloride: 98 mmol/L (ref 98–108)
Creatinine: 1.2 MG/DL (ref 0.6–1.2)
GFR Non-African American: >60 mL/min/{1.73_m2} (ref >60.0)
Glucose: 109 MG/DL (ref 73–118)
Potassium: 4.5 mmol/L (ref 3.6–5.1)
Sodium: 133 mmol/L (ref 128–145)
Total Bilirubin: 0.7 MG/DL (ref 0.2–1.6)
Total Protein: 7.7 G/DL (ref 6.4–8.1)
eGFR African American: >60 mL/min/{1.73_m2} (ref >60.0)

## 2016-03-23 LAB — ELECTROPHORESIS PROTEIN, SERUM
Abnormal Protein Band %: 26.6 %
Abnormal Protein Band: 2.6 gm/dL
Albumin %: 49.4 %
Albumin: 4.84 gm/dL (ref 4.00–5.30)
Alpha 1 %: 2.6 %
Alpha 1: 0.25 gm/dL (ref 0.10–0.30)
Alpha 2 %: 9 %
Alpha 2: 0.88 gm/dL (ref 0.50–1.00)
Beta Percent: 8.8 %
Beta: 0.86 gm/dL (ref 0.60–1.10)
Gamma Globulin %: 30.2 %
Gamma: 2.96 gm/dL — ABNORMAL HIGH (ref 0.60–1.60)
Total Protein: 9.8 gm/dL — ABNORMAL HIGH (ref 6.4–8.2)

## 2016-03-23 LAB — IGG, IGA, IGM
IgA: 112 mg/dL (ref 70–400)
IgG: 3000 mg/dL — ABNORMAL HIGH (ref 700–1600)
IgM: 57 mg/dL (ref 40–230)

## 2016-03-23 LAB — PROTEIN, TOTAL: Total Protein: 9.8 gm/dL — ABNORMAL HIGH (ref 6.4–8.2)

## 2016-03-23 LAB — KAPPA/LAMBDA QUANTITATIVE FREE LIGHT CHAINS, SERUM
Free Kappa/Lambda Ratio: 15.07 — ABNORMAL HIGH (ref 0.26–1.65)
Kappa Free Light Chains QNT: 220 mg/L — ABNORMAL HIGH (ref 3.30–19.40)
Lambda Free Light Chains QNT: 14.6 mg/L (ref 5.71–26.30)

## 2016-03-23 LAB — BETA 2 MICROGLOBULIN, SERUM: Beta-2 Microglobulin: 3.11 mg/L — ABNORMAL HIGH (ref 0.70–1.80)

## 2016-03-23 LAB — IMMUNOFIXATION SERUM PROFILE

## 2016-03-23 LAB — SEDIMENTATION RATE: Sed Rate: 41 mm/hr — ABNORMAL HIGH (ref 0–15)

## 2016-03-23 MED ORDER — SODIUM CHLORIDE 0.9 % IV SOLN
0.9 % | Freq: Once | INTRAVENOUS | Status: AC
Start: 2016-03-23 — End: 2016-03-23
  Administered 2016-03-23: 19:00:00 4 mg via INTRAVENOUS

## 2016-03-23 MED FILL — ZOMETA 4 MG/5ML IV CONC: 4 MG/5ML | INTRAVENOUS | Qty: 5

## 2016-03-23 NOTE — Progress Notes (Signed)
Patient was seen by MD today. MD aware of CBC . Okay to treat today.  No labs needed for today. Dr. Al Corpus wants to use creatinine from 7/17.  Patient Condition:  alert and oriented    Access Statements  An occlusive sterile dressing was applied after PIV established    IV Push ended at na  1510-Peripheral IV started. + blood return . Patient had no complaints. Zometa started. Patient resting comfortably, no complaints, will continue to monitor.    1530-Treatment complete. + blood return noted before access dc'd Patient had no complaints.  Patient will call office with any concerns.   Discharge Checklist  Clinic discharge accompanied by Self    Mode of transportation: Ambulatory and Ambulatory with human assistance    Clinic Discharge Note:  Treatment was admistered as prescribed without adverse event, with physician oversight.  Universal Precautions observed with treatment.  Patient instructed to call office with any questions or problems. Discharge Teaching instruction  Patient Physical Status adequate  Verify on flowchart all medications due today have been administered and documented.  Return appointment scheduled  Discharged from clinic

## 2016-03-23 NOTE — Patient Instructions (Signed)
Clinic Discharge Note     Clinic Discharge Note: Treatment was administered as prescribed without adverse event, with physician oversight.;Universal precautions observed with treatment;Patient instructed to call office with any questions or problems.  Discharge Teaching Instructions.;Verify on MAR all medications due today have been administered and documented on.;Return appointment scheduled;Discharged from clinic.    Patients Receiving  Treatment:  Report to your physician's office immediately or call  [phone]:              1.  Fever of 100.5 or greater.                2.  Vomiting without relief after using anti-nausea/vomiting medicine.                3.  Severe abdominal cramping/diarrhea, 3-5 watery bowel movements in a day.                4.  Pain at the IV site where you received your chemotherapy.                5.  Unusual or excessive bleeding from your mouth, nose, rectum, or vagina;  blood in your urine.                6.  A sudden onset of shortness of breath or chest pain.    Report to your physician's office within 24 hours:              1.  Pain not relieved by your usual pain medication.                2.  Signs of infection; redness, warmth, or swelling.                3.  Unusual bruising of any body area.                4.  Change in breathing patterns such as difficulty breathing or shortness of breath when you are resting.                5.  Change in urination; cloudiness, odor, frequency, or pain on urination.    Report to your physician's office within 48 hours:              1.  Skin changes: rash, hives, itching, redness and peeling of the skin.                2.  Numbness, tingling in the extremities or a persistent ringing in the ears.    Practice good personal hygiene.  If possible, avoid persons with colds or flu-like symptoms.  Drink plenty of fluids while awake and avoid exposure to the sun.  It is not unusual to experience other side effects, such as loss of appetite, altered  taste sensation or depression.

## 2016-03-23 NOTE — Progress Notes (Signed)
[facility] FOLLOW-UP:     Primary Oncologist: Dr. Eulis Watkins  Date: 03/23/2016    PCP: David Guiles, DO  Referring Provider: Mart Piggs, MD  Oncologist: Dr. Eulis Watkins, M.D.; M.S.    PROBLEM LIST:     Patient Active Problem List   Diagnosis   ??? Multiple myeloma [facility])       Specialty Problems        Oncology Problems    Multiple myeloma ([facility])            IgG kappa multiple myeloma with several lytic lesions.  Cord compression at T6 and the T11.  Severe back pain.    Current therapy: Radiotherapy in 10 fractions.  Status post dexamethasone 40 mg daily for 4 days. Will start Velcade, Revlimid, and the dexamethasone on August 4. Monthly Zometa.    INTERVAL HISTORY     [name] is a 59 y.o. male that is being seen in the office today for follow up.  His pain is less.  He received radiation on Friday, Saturday and today.    REVIEW OF SYSTEMS:     CONSTITUTIONAL: Denies fever, sweats, weight loss.  Still has significant pain.  EYES: No visual changes or diplopia. No scleral icterus.  ENT: No Headaches, hearing loss or vertigo. No mouth sores or sore throat.  CARDIOVASCULAR: No chest pain, dyspnea on exertion, palpitations or loss of consciousness.   RESPIRATORY: No cough or wheezing, no sputum production. No hemoptysis.    GASTROINTESTINAL: No abdominal pain, appetite loss, blood in stools. No change in bowel habits.  GENITOURINARY: No dysuria, trouble voiding, or hematuria.  MUSCULOSKELETAL: no generalized weakness. No joint complaints.  INTEGUMENTARY: No rash or pruritus.  NEUROLOGICAL: No headache, diplopia. No change in gait, balance, or coordination. No paresthesia.  ENDOCRINE: No temperature intolerance. No excessive thirst, fluid intake, or urination.   HEMATOLOGIC/LYMPHATIC: No abnormal bruising or ecchymosis, blood clots or swollen lymph nodes.  ALLERGIC/IMMUNOLOGIC: No nasal congestion or hives.     PHYSICAL EXAM:     BP 110/80   Pulse 74   Temp 97.4 ??F (36.3 ??C) (Oral)    Resp 13   Ht 5' 8.9" (1.75 m)    Wt 203 lb (92.1 kg)   BMI 30.07 kg/m2    WEIGHT:   Wt Readings from Last 3 Encounters:   03/23/16 203 lb (92.1 kg)   03/20/16 207 lb (93.9 kg)   03/20/16 206 lb (93.4 kg)     GENERAL APPEARANCE: alert and cooperative.  Not in acute distress.  HEAD: Normocephalic, without obvious abnormality, atraumatic  NECK: No palpable lymphadenopathy in supraclavicular, axillary or cervical chains  LUNGS: Clear to auscultation bilaterally, no audible rales, wheezes or crackles  HEART: Regular rate and rhythm, S1, S2 normal  ABDOMEN: Soft, non-tender; bowel sounds normal; no masses, no organomegaly  EXTREMITIES: without cyanosis, clubbing, edema or asymmetry.  Tenderness on his back.  SKIN: No jaundice, purpura or petechiae  No focal neurologic deficits.    LABS:     CBC:  Lab Results   Component Value Date    WBC 7.4 03/16/2016    HGB 12.9 (L) 03/16/2016    HCT 42.4 (L) 03/16/2016    MCV 94.4 03/16/2016    PLT 347 03/16/2016    LABLYMP 2.2 03/16/2016    MID 0.6 03/16/2016    GRAN 4.6 03/16/2016    LYMPHOPCT 29.9 03/16/2016    MIDPERCENT 7.7 03/16/2016    GRANULOCYTES 62.4 03/16/2016  RBC 4.49 (L) 03/16/2016    MCH 28.7 03/16/2016    MCHC 30.4 (L) 03/16/2016    RDW 13.9 03/16/2016       Lab Results   Component Value Date    NA 133 03/16/2016    K 4.5 03/16/2016    CL 98 03/16/2016    CO2 29 03/16/2016    BUN 15 03/16/2016    CREATININE 1.2 03/16/2016    GLUCOSE 109 03/16/2016    CALCIUM 8.8 03/16/2016    PROT 7.7 03/16/2016    PROT 9.8 (H) 03/16/2016    PROT 9.8 (H) 03/16/2016    LABALBU 3.6 03/16/2016    LABALBU 4.84 03/16/2016    BILITOT 0.7 03/16/2016    ALKPHOS 82 03/16/2016    AST 42 (H) 03/16/2016    ALT 30 03/16/2016    LABGLOM >60.0 03/16/2016       No results found for: PTINR    TUMOR MARKERS:    Lab Results   Component Value Date    PSA 0.7 03/16/2016       IMAGING:     Mri Thoracic Spine W Wo Contrast    Result Date: 03/18/2016  EXAMINATION: MRI OF THE THORACIC SPINE WITHOUT AND WITH CONTRAST  03/18/2016 11:48 am  TECHNIQUE: Multiplanar multisequence MRI of the thoracic spine was performed without and with the administration of intravenous contrast. COMPARISON: Chest CT 03/10/2016. HISTORY: ORDERING PHYSICIAN PROVIDED HISTORY: Multiple myeloma, remission status unspecified ([place]) TECHNOLOGIST PROVIDED HISTORY: Technologist Provided Reason for Exam: Multiple myeloma, remission status unspecified Acuity: Acute Type of Encounter: Subsequent/Follow-up skeletal survey Additional signs and symptoms: severe back pain Relevant Medical/Surgical History: [name] is a 60 y.o. male here today for evaluation of newly discovered lytic lesions.   He presented with cough at the beginning of this year.  He also developed right lower rib pain.  He was later seen by his primary care physician.  Because of the persistent pain he was sent to Dr. Canary Watkins.  He was given steroids.  His initial x-ray was reportedly negative.  However his cough did not go away and his pain got worse. CT of the chest at the [place] on 03/10/2016 showed a 3 mm nodule in the periphery of the left lower lobe.  There is a 3.5 x 3 cm soft tissue mass with bony destruction involving posterior elements of the T6 vertebral body with extension into the thoracic canal with at least 50% narrowing of the diameter.  There are 2 lytic lesions within T11 vertebral body and the left pedicle with mild extent expansion in the less than 25% compromise of the thoracic canal.  There is a 2.3 x 2.3 destructive lesion in the inferolateral aspect of the scapula.  Right seventh and eighth ribs showed expansile lesions in the left 2, 3, 7 and 8 showed expansile lesions.  His pain is not relieved with Percocet. FINDINGS: BONES/ALIGNMENT: There is normal alignment of the thoracic spine. There are enhancing, T2 hyperintense, expansile masses noted in the right T6 posterior elements which result in mass effect on the right side of the thoracic spinal cord and results in at least moderate central  spinal canal narrowing.  This results in right T5-T6, and T6-T7 foraminal narrowing. Additionally, there is an enhancing mass centered in the left T11 pedicle, resulting in foraminal narrowing at T10-T11, and T11-T12 on the left. Additionally, there are enhancing lesions noted along the left T7 transverse process and pedicle, and along the left T8-T9 inferior endplates.  There  is a smaller enhancing lesion noted along the right T11 pedicle and superior articular process.  There is a mildly enhancing lesion noted along the anterior aspect of the T11 vertebral body, compatible with metastatic disease versus myeloma.  There is also an enhancing lesion noted along the L1 superior endplate. There are mildly enhancing lesions noted in the T2 and T3 vertebral bodies. There is no pathologic fracture. SPINAL CORD: The thoracic spinal cord demonstrates normal signal intensity. There is no evidence of an expansile mass lesion. SOFT TISSUES:  Gravity dependent atelectasis is noted in the lungs.  No paraspinal masses are identified. DEGENERATIVE CHANGES: There is mild multilevel degenerative disc disease noted in the thoracic spine.  There is central spinal canal narrowing at T5-T6, and to a lesser extent at the level of the T11 vertebral body.     1. There are multifocal T2 hyperintense, enhancing lesions noted throughout the thoracic spine and the L1 vertebral body which are concerning for metastatic disease versus myeloma.  The largest lesions are noted along the right T6 posterior elements which result in moderate central spinal canal narrowing, and as well as along the left T11 pedicle, resulting in mild central spinal canal narrowing.  These are consistent with the abnormalities noted on the recent CT of the chest. 2. No pathologic fracture is identified.     Xr Skeletal Survey Complete    Result Date: 03/16/2016  EXAMINATION: COMPLETE BONE SURVEY 03/16/2016 1:19 pm COMPARISON: None. HISTORY: ORDERING PHYSICIAN PROVIDED  HISTORY: Multiple myeloma, remission status unspecified ([place]) TECHNOLOGIST PROVIDED HISTORY: Technologist Provided Reason for Exam: Multiple myeloma, remission status unspecified ([place]) Acuity: Unknown Type of Encounter: Unknown FINDINGS: The following x-rays were obtained: AP, lateral skull, AP and lateral cervical spine, AP and lateral thoracic spine, frontal chest, AP right and left ribs, AP and lateral lumbar spine, AP pelvis, AP and lateral right and left femurs, AP and lateral right and left tibia fibula, AP right foot, AP left foot, AP right hand, AP left hand, AP right and left humerus, AP right and left forearm. Sinuses are clear.  Septum is midline.  No focal lucent or sclerotic skull lesions are identified.  Mild diffuse degenerative osteophyte formation is seen along the cervical, thoracic and lumbar spine.  Pedicles well visualized without focal lucent or sclerotic lesion identified.  Left 3rd rib fracture is demonstrated.  Left 8th rib demonstrates an expansile lucent lesion. Right 4th rib fracture is demonstrated.  There is a focal expansile lucent lesion involving the right scapular body.  Spondylolysis is noted at L5 with severe disc height loss at L5-S1.  Bones of the pelvis demonstrate no focal lucent or sclerotic lesions.  The femurs are intact.  Lower extremities are intact.  The feet demonstrate no lucent or sclerotic lesions.  The right proximal humerus demonstrates a subtle lucency which may represent a focal myeloma deposit.  Severe degenerative spurring is seen about the left elbow. The left humerus is intact.  Left and right forearms demonstrate no focal lucent or sclerotic lesions.  Left and right hands demonstrate no focal lucent or sclerotic lesions.  Clavicles demonstrate no focal lucent or sclerotic lesion.  Lucencies overlying the lumbar vertebral bodies are felt to be secondary to overlying bowel gas.     Expansile lucent lesions are identified involving the left 3rd and 8th rib and  the right scapula.  Left 3rd and right 4th rib fractures are seen. Subtle lucency in the right proximal humerus may represent a focal lesion as  well.       STAGING:     No matching staging information was found for the patient.    ASSESSMENT AND PLAN:     IgG kappa multiple myeloma with several lytic lesions.  Cord compression at T6 and the T11.  Severe back pain.    He will continue radiation therapy.  First dose of Zometa will be given today.  He will continue dexamethasone 4 mg twice a day.  He will continue MS Contin and Percocet for his pain control.  He will return on August 4 to start induction therapy for his multiple myeloma.  I discussed with Dr. Jamas Lav and Dr. Maceo Pro.      David Watkins, M.D.; M.S.  Medical Oncology/Hematology  Phone: [phone]  Fax: [phone]    [facility]  [address] [place] [address] [place] [address]    [place]  [place] [address]

## 2016-03-24 ENCOUNTER — Ambulatory Visit: Attending: Radiation Oncology | Primary: Hematology & Oncology

## 2016-03-24 DIAGNOSIS — C9000 Multiple myeloma not having achieved remission: Secondary | ICD-10-CM

## 2016-03-24 NOTE — Telephone Encounter (Signed)
Chemo Follow Up - Call    Diagnosis: Multiple Myeloma   Date of first treatment: 03/23/16   Regimen: Velcade,Revlimid, Zometa    Problem/Assessment  Nausea: no  Vomiting:  no  Diarrhea: no  Constipation:no  Difficulty Sleeping: no  Oral Intake:    Performing Mouth Care: no, patient will start treatment on 8/4.   Not Performing Mouth Care per [facility]     Peripheral / Access Device Site: WNL  Other Problems:no    Plan/Implementation  Reinforced appropriate use/dose of anti nausea meds/prescribed meds    Evaluation/Follow-Up  Patient/Caregiver verbalizes understanding of instructions and will call and will call with further problems and questions  Self-Care Instructions Provided     Pt scheduled to start chemotherapy 8/4 after radiation.

## 2016-03-24 NOTE — Progress Notes (Signed)
Weekly Radiation Treatment Progress Note    Current/Total Doses:  1200/3000  Chemo: no  Current/Total Fractions:  4/10    Treatment Modality:  AP/PA    Site:  T5-T7 incl, T10-L3 incl, Rt scapula      Chief Complaint:  Pt seen and evaluated for symptom management. VSS.   Chief Complaint   Patient presents with   ??? Treatment   ??? Multiple Myeloma          Subjective:  Pain is improving and controlled. On Zometa. Taking MS Contin and about 3 Percosets per day. No esophagitis. No further nausea.On decadron- 4 mg po BID.      EXAM  Vitals:    03/24/16 1012   BP: 118/82   Pulse: 73   Resp: 16   Temp: 97.8 ??F (36.6 ??C)   SpO2: 96%     NAD, VSS  Tongue ML- no thrush  No skin erythema in tx field.  Can ambulate independently.      Assessment:    Tolerating well.      Plan:   Continue treatment as planned.  On Thursday, decrease decadron to 2 mg po bid.  Is aware esophagitis is likely. Talked about management strategies and that it usually improves 3 weeks post xrt.    Phillips Hay

## 2016-03-31 ENCOUNTER — Ambulatory Visit: Attending: Radiation Oncology | Primary: Hematology & Oncology

## 2016-03-31 DIAGNOSIS — C9000 Multiple myeloma not having achieved remission: Secondary | ICD-10-CM

## 2016-03-31 MED ORDER — ZOLPIDEM TARTRATE 5 MG PO TABS
5 MG | ORAL_TABLET | Freq: Every evening | ORAL | 0 refills | Status: AC | PRN
Start: 2016-03-31 — End: 2016-04-14

## 2016-03-31 NOTE — Progress Notes (Signed)
Weekly Radiation Treatment Progress Note    Current/Total Doses:  2700/3000  Chemo: yes  Current/Total Fractions:  9/10    Treatment Modality:  AP/PA    Site: T-5 / T7  T-10 / L-3  Right Scapula      Chief Complaint:    Chief Complaint   Patient presents with   ??? Treatment   ??? Multiple Myeloma          Subjective:  Patient still rating right scapular pain in a 7/10.  Taking MS Contin 50 mg twice a day and Percocet every 4 hours essentially around-the-clock.  Currently having bowel movements about every other day.      EXAM  Vitals:    03/31/16 1412   BP: 124/80   Pulse: 75   Temp: 98 ??F (36.7 ??C)   SpO2: 97%     NAD, VSS  Mild erythema on his back.  No desquamation.    Assessment:    Tolerating well.  Unfortunately only experiencing minimal pain relief in right scapular area.  Denies pain in other areas of metastasis.  Hopefully well have further pain relief in the next few weeks.      Plan:   Continue treatment as planned.  Return in 2 weeks for follow-up with Dr. Maceo Pro.  Patient to possibly discuss kyphoplasty at that time.  To start Velcade/Revlimid/dexamethasone on Friday.    E Nikoloz Huy,MD

## 2016-04-03 ENCOUNTER — Encounter: Admit: 2016-04-03 | Discharge: 2016-04-03 | Payer: BLUE CROSS/BLUE SHIELD | Primary: Hematology & Oncology

## 2016-04-03 ENCOUNTER — Ambulatory Visit
Admit: 2016-04-03 | Discharge: 2016-04-03 | Payer: BLUE CROSS/BLUE SHIELD | Attending: Adult Health | Primary: Hematology & Oncology

## 2016-04-03 DIAGNOSIS — C9000 Multiple myeloma not having achieved remission: Secondary | ICD-10-CM

## 2016-04-03 MED ORDER — SODIUM CHLORIDE 0.9 % IV SOLN
0.9 % | Freq: Once | INTRAVENOUS | Status: AC
Start: 2016-04-03 — End: 2016-04-03
  Administered 2016-04-03: 14:00:00 2.75 mg/m2 via SUBCUTANEOUS

## 2016-04-03 MED ORDER — TEMAZEPAM 15 MG PO CAPS
15 MG | ORAL_CAPSULE | Freq: Every evening | ORAL | 0 refills | Status: AC | PRN
Start: 2016-04-03 — End: 2016-05-03

## 2016-04-03 MED FILL — VELCADE 3.5 MG IJ SOLR: 3.5 MG | INTRAMUSCULAR | Qty: 2.75

## 2016-04-03 NOTE — Patient Instructions (Signed)
Clinic Discharge Note     Clinic Discharge Note: Treatment was administered as prescribed without adverse event, with physician oversight.;Universal precautions observed with treatment;Patient instructed to call office with any questions or problems.  Discharge Teaching Instructions.;Verify on MAR all medications due today have been administered and documented on.;Return appointment scheduled;Discharged from clinic.    Patients Receiving  Treatment:  Report to your physician's office immediately or call  [phone]:              1.  Fever of 100.5 or greater.                2.  Vomiting without relief after using anti-nausea/vomiting medicine.                3.  Severe abdominal cramping/diarrhea, 3-5 watery bowel movements in a day.                4.  Pain at the IV site where you received your chemotherapy.                5.  Unusual or excessive bleeding from your mouth, nose, rectum, or vagina;  blood in your urine.                6.  A sudden onset of shortness of breath or chest pain.    Report to your physician's office within 24 hours:              1.  Pain not relieved by your usual pain medication.                2.  Signs of infection; redness, warmth, or swelling.                3.  Unusual bruising of any body area.                4.  Change in breathing patterns such as difficulty breathing or shortness of breath when you are resting.                5.  Change in urination; cloudiness, odor, frequency, or pain on urination.    Report to your physician's office within 48 hours:              1.  Skin changes: rash, hives, itching, redness and peeling of the skin.                2.  Numbness, tingling in the extremities or a persistent ringing in the ears.    Practice good personal hygiene.  If possible, avoid persons with colds or flu-like symptoms.  Drink plenty of fluids while awake and avoid exposure to the sun.  It is not unusual to experience other side effects, such as loss of appetite, altered  taste sensation or depression.

## 2016-04-03 NOTE — Telephone Encounter (Signed)
Estill Bamberg with [facility] called regarding Questions about Medication; needing to verify if patient is to discontinue using Ambien prior to starting Temazepam. Estill Bamberg may be reached at [phone].

## 2016-04-03 NOTE — Progress Notes (Signed)
[facility] FOLLOW-UP:     Primary Oncologist: Dr. Eulis Canner  Date: 04/03/2016    PCP: Donia Guiles, DO  Referring Provider: Mart Piggs, MD  Oncologist: Dr. Eulis Canner, M.D.; M.S.    PROBLEM LIST:     Patient Active Problem List   Diagnosis   ??? Multiple myeloma [facility])       Specialty Problems        Oncology Problems    Multiple myeloma (HCC)            IgG kappa multiple myeloma with several lytic lesions.  Cord compression at T6 and the T11.  Severe back pain.  Received radiotherapy in 10 fractions.    Current therapy:   Status post dexamethasone 40 mg daily for 4 days. Started Velcade, Revlimid, and the dexamethasone on August 4. Monthly Zometa started 03/23/16.    INTERVAL HISTORY     [name] is a 60 y.o. male that is being seen in the office today to begin treatment for multiple myeloma. He will receive his fist dose of Velcade today. He states that Revlimid was filled and is ready at his pharmacy but he has not started taking it. He will start it today. He is very anxious about starting treatment. He states that he is not sleeping well due to pain and anxiety. He was prescribed ambien but it is not effective. He is willing to proceed with treatment.     REVIEW OF SYSTEMS:     CONSTITUTIONAL: Denies fever, sweats, weight loss.  Still has significant pain.  EYES: No visual changes or diplopia. No scleral icterus.  ENT: No Headaches, hearing loss or vertigo. No mouth sores or sore throat.  CARDIOVASCULAR: No chest pain, dyspnea on exertion, palpitations or loss of consciousness.   RESPIRATORY: No cough or wheezing, no sputum production. No hemoptysis.    GASTROINTESTINAL: No abdominal pain, appetite loss, blood in stools. No change in bowel habits.  GENITOURINARY: No dysuria, trouble voiding, or hematuria.  MUSCULOSKELETAL: no generalized weakness. No joint complaints.  INTEGUMENTARY: No rash or pruritus.  NEUROLOGICAL: No headache, diplopia. No change in gait, balance, or coordination. No  paresthesia.  ENDOCRINE: No temperature intolerance. No excessive thirst, fluid intake, or urination.   HEMATOLOGIC/LYMPHATIC: No abnormal bruising or ecchymosis, blood clots or swollen lymph nodes.  ALLERGIC/IMMUNOLOGIC: No nasal congestion or hives.     PHYSICAL EXAM:     BP 120/82   Pulse 72   Temp 98.1 ??F (36.7 ??C) (Oral)    Resp 14   Ht 5' 8.11" (1.73 m)   Wt 196 lb (88.9 kg)   BMI 29.71 kg/m2    WEIGHT:   Wt Readings from Last 3 Encounters:   04/03/16 196 lb (88.9 kg)   03/31/16 202 lb (91.6 kg)   03/24/16 202 lb (91.6 kg)     GENERAL APPEARANCE: alert and cooperative.  Not in acute distress.  HEAD: Normocephalic, without obvious abnormality, atraumatic  NECK: No palpable lymphadenopathy in supraclavicular, axillary or cervical chains  LUNGS: Clear to auscultation bilaterally, no audible rales, wheezes or crackles  HEART: Regular rate and rhythm, S1, S2 normal  ABDOMEN: Soft, non-tender; bowel sounds normal; no masses, no organomegaly  EXTREMITIES: without cyanosis, clubbing, edema or asymmetry.  Tenderness on his back.  SKIN: No jaundice, purpura or petechiae  No focal neurologic deficits.    LABS:     CBC:  Lab Results   Component Value Date    WBC 7.4 03/16/2016  HGB 12.9 (L) 03/16/2016    HCT 42.4 (L) 03/16/2016    MCV 94.4 03/16/2016    PLT 347 03/16/2016    LABLYMP 2.2 03/16/2016    MID 0.6 03/16/2016    GRAN 4.6 03/16/2016    LYMPHOPCT 29.9 03/16/2016    MIDPERCENT 7.7 03/16/2016    GRANULOCYTES 62.4 03/16/2016    RBC 4.49 (L) 03/16/2016    MCH 28.7 03/16/2016    MCHC 30.4 (L) 03/16/2016    RDW 13.9 03/16/2016       Lab Results   Component Value Date    NA 133 03/16/2016    K 4.5 03/16/2016    CL 98 03/16/2016    CO2 29 03/16/2016    BUN 15 03/16/2016    CREATININE 1.2 03/16/2016    GLUCOSE 109 03/16/2016    CALCIUM 8.8 03/16/2016    PROT 7.7 03/16/2016    PROT 9.8 (H) 03/16/2016    PROT 9.8 (H) 03/16/2016    LABALBU 3.6 03/16/2016    LABALBU 4.84 03/16/2016    BILITOT 0.7 03/16/2016    ALKPHOS 82  03/16/2016    AST 42 (H) 03/16/2016    ALT 30 03/16/2016    LABGLOM >60.0 03/16/2016       No results found for: PTINR    TUMOR MARKERS:    Lab Results   Component Value Date    PSA 0.7 03/16/2016       IMAGING:     Mri Thoracic Spine W Wo Contrast    Result Date: 03/18/2016  EXAMINATION: MRI OF THE THORACIC SPINE WITHOUT AND WITH CONTRAST  03/18/2016 11:48 am TECHNIQUE: Multiplanar multisequence MRI of the thoracic spine was performed without and with the administration of intravenous contrast. COMPARISON: Chest CT 03/10/2016. HISTORY: ORDERING PHYSICIAN PROVIDED HISTORY: Multiple myeloma, remission status unspecified ([place]) TECHNOLOGIST PROVIDED HISTORY: Technologist Provided Reason for Exam: Multiple myeloma, remission status unspecified Acuity: Acute Type of Encounter: Subsequent/Follow-up skeletal survey Additional signs and symptoms: severe back pain Relevant Medical/Surgical History: David Watkins is a 60 y.o. male here today for evaluation of newly discovered lytic lesions.   He presented with cough at the beginning of this year.  He also developed right lower rib pain.  He was later seen by his primary care physician.  Because of the persistent pain he was sent to Dr. Canary Brim.  He was given steroids.  His initial x-ray was reportedly negative.  However his cough did not go away and his pain got worse. CT of the chest at the [place] on 03/10/2016 showed a 3 mm nodule in the periphery of the left lower lobe.  There is a 3.5 x 3 cm soft tissue mass with bony destruction involving posterior elements of the T6 vertebral body with extension into the thoracic canal with at least 50% narrowing of the diameter.  There are 2 lytic lesions within T11 vertebral body and the left pedicle with mild extent expansion in the less than 25% compromise of the thoracic canal.  There is a 2.3 x 2.3 destructive lesion in the inferolateral aspect of the scapula.  Right seventh and eighth ribs showed expansile lesions in the left 2, 3, 7  and 8 showed expansile lesions.  His pain is not relieved with Percocet. FINDINGS: BONES/ALIGNMENT: There is normal alignment of the thoracic spine. There are enhancing, T2 hyperintense, expansile masses noted in the right T6 posterior elements which result in mass effect on the right side of the thoracic spinal cord and results in at  least moderate central spinal canal narrowing.  This results in right T5-T6, and T6-T7 foraminal narrowing. Additionally, there is an enhancing mass centered in the left T11 pedicle, resulting in foraminal narrowing at T10-T11, and T11-T12 on the left. Additionally, there are enhancing lesions noted along the left T7 transverse process and pedicle, and along the left T8-T9 inferior endplates.  There is a smaller enhancing lesion noted along the right T11 pedicle and superior articular process.  There is a mildly enhancing lesion noted along the anterior aspect of the T11 vertebral body, compatible with metastatic disease versus myeloma.  There is also an enhancing lesion noted along the L1 superior endplate. There are mildly enhancing lesions noted in the T2 and T3 vertebral bodies. There is no pathologic fracture. SPINAL CORD: The thoracic spinal cord demonstrates normal signal intensity. There is no evidence of an expansile mass lesion. SOFT TISSUES:  Gravity dependent atelectasis is noted in the lungs.  No paraspinal masses are identified. DEGENERATIVE CHANGES: There is mild multilevel degenerative disc disease noted in the thoracic spine.  There is central spinal canal narrowing at T5-T6, and to a lesser extent at the level of the T11 vertebral body.     1. There are multifocal T2 hyperintense, enhancing lesions noted throughout the thoracic spine and the L1 vertebral body which are concerning for metastatic disease versus myeloma.  The largest lesions are noted along the right T6 posterior elements which result in moderate central spinal canal narrowing, and as well as along the  left T11 pedicle, resulting in mild central spinal canal narrowing.  These are consistent with the abnormalities noted on the recent CT of the chest. 2. No pathologic fracture is identified.     Xr Skeletal Survey Complete    Result Date: 03/16/2016  EXAMINATION: COMPLETE BONE SURVEY 03/16/2016 1:19 pm COMPARISON: None. HISTORY: ORDERING PHYSICIAN PROVIDED HISTORY: Multiple myeloma, remission status unspecified ([place]) TECHNOLOGIST PROVIDED HISTORY: Technologist Provided Reason for Exam: Multiple myeloma, remission status unspecified ([place]) Acuity: Unknown Type of Encounter: Unknown FINDINGS: The following x-rays were obtained: AP, lateral skull, AP and lateral cervical spine, AP and lateral thoracic spine, frontal chest, AP right and left ribs, AP and lateral lumbar spine, AP pelvis, AP and lateral right and left femurs, AP and lateral right and left tibia fibula, AP right foot, AP left foot, AP right hand, AP left hand, AP right and left humerus, AP right and left forearm. Sinuses are clear.  Septum is midline.  No focal lucent or sclerotic skull lesions are identified.  Mild diffuse degenerative osteophyte formation is seen along the cervical, thoracic and lumbar spine.  Pedicles well visualized without focal lucent or sclerotic lesion identified.  Left 3rd rib fracture is demonstrated.  Left 8th rib demonstrates an expansile lucent lesion. Right 4th rib fracture is demonstrated.  There is a focal expansile lucent lesion involving the right scapular body.  Spondylolysis is noted at L5 with severe disc height loss at L5-S1.  Bones of the pelvis demonstrate no focal lucent or sclerotic lesions.  The femurs are intact.  Lower extremities are intact.  The feet demonstrate no lucent or sclerotic lesions.  The right proximal humerus demonstrates a subtle lucency which may represent a focal myeloma deposit.  Severe degenerative spurring is seen about the left elbow. The left humerus is intact.  Left and right forearms  demonstrate no focal lucent or sclerotic lesions.  Left and right hands demonstrate no focal lucent or sclerotic lesions.  Clavicles demonstrate no focal lucent  or sclerotic lesion.  Lucencies overlying the lumbar vertebral bodies are felt to be secondary to overlying bowel gas.     Expansile lucent lesions are identified involving the left 3rd and 8th rib and the right scapula.  Left 3rd and right 4th rib fractures are seen. Subtle lucency in the right proximal humerus may represent a focal lesion as well.       STAGING:     No matching staging information was found for the patient.    ASSESSMENT AND PLAN:     IgG kappa multiple myeloma with several lytic lesions.  Cord compression at T6 and the T11.  Severe back pain.    Temazepam prescribed for insomnia.     He has completed radiation.  First dose of Zometa 7/24.   He will continue MS Contin and Percocet for his pain control. Velcade given today. He will start revlimid today. He will continue dexamethasone 2 mg twice a day. He will return on Monday for follow up and second dose of Velcade.    He and his wife verbalized the understanding of the plan.     Janeece Fitting, NP

## 2016-04-03 NOTE — Telephone Encounter (Signed)
D/c Ambien per Janeece Fitting APP  Spoke with Estill Bamberg who verbalized understanding

## 2016-04-03 NOTE — Progress Notes (Signed)
0927-patient here today for velcade  0930-oral compazine taken at home per patient at 730am.   0957-velcade Injection given no complaints.     Discharge Checklist  Clinic discharge accompanied by Self    Mode of transportation: Ambulatory    Clinic Discharge Note:  Treatment was admistered as prescribed without adverse event, with physician oversight.  Universal Precautions observed with treatment.  Patient instructed to call office with any questions or problems. Discharge Teaching instruction  Patient Physical Status adequate

## 2016-04-06 ENCOUNTER — Ambulatory Visit
Admit: 2016-04-06 | Discharge: 2016-04-06 | Payer: BLUE CROSS/BLUE SHIELD | Attending: Hematology & Oncology | Primary: Hematology & Oncology

## 2016-04-06 ENCOUNTER — Encounter: Admit: 2016-04-06 | Discharge: 2016-04-06 | Payer: BLUE CROSS/BLUE SHIELD | Primary: Hematology & Oncology

## 2016-04-06 DIAGNOSIS — C9000 Multiple myeloma not having achieved remission: Secondary | ICD-10-CM

## 2016-04-06 LAB — CBC WITH AUTO DIFFERENTIAL
Granulocytes %: 68.4 % (ref 37.0–80.0)
Granulocytes: 3.8 10*3/uL (ref 2.0–7.8)
Hematocrit: 39.4 % — ABNORMAL LOW (ref 43.5–53.7)
Hemoglobin: 12.4 g/dL — ABNORMAL LOW (ref 14.1–18.1)
Lymphocyte %: 20.5 % (ref 10.0–50.0)
Lymphocytes: 1.1 10*3/uL (ref 0.6–4.1)
MCH: 29 pg (ref 27.0–31.2)
MCHC: 31.5 g/dL — ABNORMAL LOW (ref 31.8–35.4)
MCV: 92.2 fL (ref 80.0–97.0)
Mid Cells %: 11.1 % (ref 0.1–24.0)
Mid Cells: 0.6 10*3/uL (ref 0.0–1.8)
Platelets: 198 10*3/uL (ref 142–424)
RBC: 4.27 M/uL — ABNORMAL LOW (ref 4.69–6.13)
RDW: 14.5 % (ref 11.6–14.8)
WBC: 5.5 10*3/uL (ref 4.6–10.2)

## 2016-04-06 MED ORDER — PROCHLORPERAZINE MALEATE 10 MG PO TABS
10 MG | Freq: Once | ORAL | Status: DC
Start: 2016-04-06 — End: 2016-04-06

## 2016-04-06 MED ORDER — MORPHINE SULFATE ER 30 MG PO TBCR
30 MG | ORAL_TABLET | Freq: Two times a day (BID) | ORAL | 0 refills | Status: AC
Start: 2016-04-06 — End: 2016-05-06

## 2016-04-06 MED ORDER — OXYCODONE-ACETAMINOPHEN 7.5-325 MG PO TABS
7.5-325 | ORAL_TABLET | ORAL | 0 refills | Status: DC | PRN
Start: 2016-04-06 — End: 2016-09-16

## 2016-04-06 MED ORDER — ONDANSETRON HCL 8 MG PO TABS
8 MG | Freq: Once | ORAL | Status: AC
Start: 2016-04-06 — End: 2016-04-06
  Administered 2016-04-06: 14:00:00 8 mg via ORAL

## 2016-04-06 MED ORDER — BORTEZOMIB 3.5 MG IJ SOLR
3.5 MG | Freq: Once | INTRAMUSCULAR | Status: AC
Start: 2016-04-06 — End: 2016-04-06
  Administered 2016-04-06: 14:00:00 2.75 mg/m2 via SUBCUTANEOUS

## 2016-04-06 MED FILL — PROCHLORPERAZINE MALEATE 10 MG PO TABS: 10 MG | ORAL | Qty: 1

## 2016-04-06 MED FILL — VELCADE 3.5 MG IJ SOLR: 3.5 MG | INTRAMUSCULAR | Qty: 2.75

## 2016-04-06 MED FILL — ONDANSETRON HCL 8 MG PO TABS: 8 MG | ORAL | Qty: 1

## 2016-04-06 NOTE — Patient Instructions (Signed)
Clinic Discharge Note     Clinic Discharge Note: Treatment was administered as prescribed without adverse event, with physician oversight.;Universal precautions observed with treatment;Patient instructed to call office with any questions or problems.  Discharge Teaching Instructions.;Verify on MAR all medications due today have been administered and documented on.;Return appointment scheduled;Discharged from clinic.    Patients Receiving  Treatment:  Report to your physician's office immediately or call  [phone]:              1.  Fever of 100.5 or greater.                2.  Vomiting without relief after using anti-nausea/vomiting medicine.                3.  Severe abdominal cramping/diarrhea, 3-5 watery bowel movements in a day.                4.  Pain at the IV site where you received your chemotherapy.                5.  Unusual or excessive bleeding from your mouth, nose, rectum, or vagina;  blood in your urine.                6.  A sudden onset of shortness of breath or chest pain.    Report to your physician's office within 24 hours:              1.  Pain not relieved by your usual pain medication.                2.  Signs of infection; redness, warmth, or swelling.                3.  Unusual bruising of any body area.                4.  Change in breathing patterns such as difficulty breathing or shortness of breath when you are resting.                5.  Change in urination; cloudiness, odor, frequency, or pain on urination.    Report to your physician's office within 48 hours:              1.  Skin changes: rash, hives, itching, redness and peeling of the skin.                2.  Numbness, tingling in the extremities or a persistent ringing in the ears.    Practice good personal hygiene.  If possible, avoid persons with colds or flu-like symptoms.  Drink plenty of fluids while awake and avoid exposure to the sun.  It is not unusual to experience other side effects, such as loss of appetite, altered  taste sensation or depression.

## 2016-04-06 NOTE — Progress Notes (Signed)
Patient had taken Compazine 10mg  tablet at home between 4am to 5am, discussed with Dr Al Corpus, new order for Zofran 8mg  tablet pre-med, Velcade injection, tolerated well     Discharge Checklist  Clinic discharge accompanied by Spouse    Mode of transportation: Ambulatory    Clinic Discharge Note:  Treatment was admistered as prescribed without adverse event, with physician oversight.  Universal Precautions observed with treatment.  Documented problems discussed with MD.  Patient instructed to call office with any questions or problems. Discharge Teaching instruction  Patient Physical Status adequate  Verify on flowchart all medications due today have been administered and documented.  Return appointment scheduled  Discharged from clinic

## 2016-04-06 NOTE — Progress Notes (Signed)
[facility] FOLLOW-UP:     Primary Oncologist: Dr. Eulis Watkins  Date: 04/06/2016    PCP: Donia Guiles, DO  Referring Provider: Mart Piggs, MD  Oncologist: Dr. Eulis Watkins, M.D.; M.S.    PROBLEM LIST:     Patient Active Problem List   Diagnosis   ??? Multiple myeloma [facility])       Specialty Problems        Oncology Problems    Multiple myeloma (HCC)            IgG kappa multiple myeloma with several lytic lesions.  Cord compression at T6 and the T11.  Severe back pain.  Received radiotherapy in 10 fractions.    Current therapy:   VRD, C1D4; Status post dexamethasone 40 mg daily for 4 days. Started Velcade, Revlimid, and the dexamethasone on August 4. Monthly Zometa started 03/23/16.    David Watkins returns for follow-up and treatment.  He still has significant back pain especially he gets up.  He continues dexamethasone.    REVIEW OF SYSTEMS:     CONSTITUTIONAL: Denies fever, sweats, weight loss.  Still has significant pain.  EYES: No visual changes or diplopia. No scleral icterus.  ENT: No Headaches, hearing loss or vertigo. No mouth sores or sore throat.  CARDIOVASCULAR: No chest pain, dyspnea on exertion, palpitations or loss of consciousness.   RESPIRATORY: No cough or wheezing, no sputum production. No hemoptysis.    GASTROINTESTINAL: No abdominal pain, appetite loss, blood in stools. No change in bowel habits.  GENITOURINARY: No dysuria, trouble voiding, or hematuria.  MUSCULOSKELETAL: no generalized weakness. No joint complaints.  INTEGUMENTARY: No rash or pruritus.  NEUROLOGICAL: No headache, diplopia. No change in gait, balance, or coordination. No paresthesia.  ENDOCRINE: No temperature intolerance. No excessive thirst, fluid intake, or urination.   HEMATOLOGIC/LYMPHATIC: No abnormal bruising or ecchymosis, blood clots or swollen lymph nodes.  ALLERGIC/IMMUNOLOGIC: No nasal congestion or hives.     PHYSICAL EXAM:     BP 110/70   Pulse 68   Temp 97.8 ??F (36.6 ??C) (Oral)    Resp 14   Ht 5' 8"  (1.727  m)   Wt 198 lb 8 oz (90 kg)   BMI 30.18 kg/m2    WEIGHT:   Wt Readings from Last 3 Encounters:   04/06/16 198 lb 8 oz (90 kg)   04/03/16 196 lb (88.9 kg)   03/31/16 202 lb (91.6 kg)     GENERAL APPEARANCE: alert and cooperative.  Not in acute distress.  HEAD: Normocephalic, without obvious abnormality, atraumatic  NECK: No palpable lymphadenopathy in supraclavicular, axillary or cervical chains  LUNGS: Clear to auscultation bilaterally, no audible rales, wheezes or crackles  HEART: Regular rate and rhythm, S1, S2 normal  ABDOMEN: Soft, non-tender; bowel sounds normal; no masses, no organomegaly  EXTREMITIES: without cyanosis, clubbing, edema or asymmetry.  Tenderness on his back.  SKIN: No jaundice, purpura or petechiae  No focal neurologic deficits.    LABS:     CBC:  Lab Results   Component Value Date    WBC 7.4 03/16/2016    HGB 12.9 (L) 03/16/2016    HCT 42.4 (L) 03/16/2016    MCV 94.4 03/16/2016    PLT 347 03/16/2016    LABLYMP 2.2 03/16/2016    MID 0.6 03/16/2016    GRAN 4.6 03/16/2016    LYMPHOPCT 29.9 03/16/2016    MIDPERCENT 7.7 03/16/2016    GRANULOCYTES 62.4 03/16/2016  RBC 4.49 (L) 03/16/2016    MCH 28.7 03/16/2016    MCHC 30.4 (L) 03/16/2016    RDW 13.9 03/16/2016       Lab Results   Component Value Date    NA 133 03/16/2016    K 4.5 03/16/2016    CL 98 03/16/2016    CO2 29 03/16/2016    BUN 15 03/16/2016    CREATININE 1.2 03/16/2016    GLUCOSE 109 03/16/2016    CALCIUM 8.8 03/16/2016    PROT 7.7 03/16/2016    PROT 9.8 (H) 03/16/2016    PROT 9.8 (H) 03/16/2016    LABALBU 3.6 03/16/2016    LABALBU 4.84 03/16/2016    BILITOT 0.7 03/16/2016    ALKPHOS 82 03/16/2016    AST 42 (H) 03/16/2016    ALT 30 03/16/2016    LABGLOM >60.0 03/16/2016       No results found for: PTINR    TUMOR MARKERS:    Lab Results   Component Value Date    PSA 0.7 03/16/2016       IMAGING:     Mri Thoracic Spine W Wo Contrast    Result Date: 03/18/2016  EXAMINATION: MRI OF THE THORACIC SPINE WITHOUT AND WITH CONTRAST  03/18/2016  11:48 am TECHNIQUE: Multiplanar multisequence MRI of the thoracic spine was performed without and with the administration of intravenous contrast. COMPARISON: Chest CT 03/10/2016. HISTORY: ORDERING PHYSICIAN PROVIDED HISTORY: Multiple myeloma, remission status unspecified ([place]) TECHNOLOGIST PROVIDED HISTORY: Technologist Provided Reason for Exam: Multiple myeloma, remission status unspecified Acuity: Acute Type of Encounter: Subsequent/Follow-up skeletal survey Additional signs and symptoms: severe back pain Relevant Medical/Surgical History: David Watkins is a 60 y.o. male here today for evaluation of newly discovered lytic lesions.   He presented with cough at the beginning of this year.  He also developed right lower rib pain.  He was later seen by his primary care physician.  Because of the persistent pain he was sent to Dr. Canary Brim.  He was given steroids.  His initial x-ray was reportedly negative.  However his cough did not go away and his pain got worse. CT of the chest at the [place] on 03/10/2016 showed a 3 mm nodule in the periphery of the left lower lobe.  There is a 3.5 x 3 cm soft tissue mass with bony destruction involving posterior elements of the T6 vertebral body with extension into the thoracic canal with at least 50% narrowing of the diameter.  There are 2 lytic lesions within T11 vertebral body and the left pedicle with mild extent expansion in the less than 25% compromise of the thoracic canal.  There is a 2.3 x 2.3 destructive lesion in the inferolateral aspect of the scapula.  Right seventh and eighth ribs showed expansile lesions in the left 2, 3, 7 and 8 showed expansile lesions.  His pain is not relieved with Percocet. FINDINGS: BONES/ALIGNMENT: There is normal alignment of the thoracic spine. There are enhancing, T2 hyperintense, expansile masses noted in the right T6 posterior elements which result in mass effect on the right side of the thoracic spinal cord and results in at least moderate  central spinal canal narrowing.  This results in right T5-T6, and T6-T7 foraminal narrowing. Additionally, there is an enhancing mass centered in the left T11 pedicle, resulting in foraminal narrowing at T10-T11, and T11-T12 on the left. Additionally, there are enhancing lesions noted along the left T7 transverse process and pedicle, and along the left T8-T9 inferior endplates.  There  is a smaller enhancing lesion noted along the right T11 pedicle and superior articular process.  There is a mildly enhancing lesion noted along the anterior aspect of the T11 vertebral body, compatible with metastatic disease versus myeloma.  There is also an enhancing lesion noted along the L1 superior endplate. There are mildly enhancing lesions noted in the T2 and T3 vertebral bodies. There is no pathologic fracture. SPINAL CORD: The thoracic spinal cord demonstrates normal signal intensity. There is no evidence of an expansile mass lesion. SOFT TISSUES:  Gravity dependent atelectasis is noted in the lungs.  No paraspinal masses are identified. DEGENERATIVE CHANGES: There is mild multilevel degenerative disc disease noted in the thoracic spine.  There is central spinal canal narrowing at T5-T6, and to a lesser extent at the level of the T11 vertebral body.     1. There are multifocal T2 hyperintense, enhancing lesions noted throughout the thoracic spine and the L1 vertebral body which are concerning for metastatic disease versus myeloma.  The largest lesions are noted along the right T6 posterior elements which result in moderate central spinal canal narrowing, and as well as along the left T11 pedicle, resulting in mild central spinal canal narrowing.  These are consistent with the abnormalities noted on the recent CT of the chest. 2. No pathologic fracture is identified.     Xr Skeletal Survey Complete    Result Date: 03/16/2016  EXAMINATION: COMPLETE BONE SURVEY 03/16/2016 1:19 pm COMPARISON: None. HISTORY: ORDERING PHYSICIAN  PROVIDED HISTORY: Multiple myeloma, remission status unspecified ([place]) TECHNOLOGIST PROVIDED HISTORY: Technologist Provided Reason for Exam: Multiple myeloma, remission status unspecified ([place]) Acuity: Unknown Type of Encounter: Unknown FINDINGS: The following x-rays were obtained: AP, lateral skull, AP and lateral cervical spine, AP and lateral thoracic spine, frontal chest, AP right and left ribs, AP and lateral lumbar spine, AP pelvis, AP and lateral right and left femurs, AP and lateral right and left tibia fibula, AP right foot, AP left foot, AP right hand, AP left hand, AP right and left humerus, AP right and left forearm. Sinuses are clear.  Septum is midline.  No focal lucent or sclerotic skull lesions are identified.  Mild diffuse degenerative osteophyte formation is seen along the cervical, thoracic and lumbar spine.  Pedicles well visualized without focal lucent or sclerotic lesion identified.  Left 3rd rib fracture is demonstrated.  Left 8th rib demonstrates an expansile lucent lesion. Right 4th rib fracture is demonstrated.  There is a focal expansile lucent lesion involving the right scapular body.  Spondylolysis is noted at L5 with severe disc height loss at L5-S1.  Bones of the pelvis demonstrate no focal lucent or sclerotic lesions.  The femurs are intact.  Lower extremities are intact.  The feet demonstrate no lucent or sclerotic lesions.  The right proximal humerus demonstrates a subtle lucency which may represent a focal myeloma deposit.  Severe degenerative spurring is seen about the left elbow. The left humerus is intact.  Left and right forearms demonstrate no focal lucent or sclerotic lesions.  Left and right hands demonstrate no focal lucent or sclerotic lesions.  Clavicles demonstrate no focal lucent or sclerotic lesion.  Lucencies overlying the lumbar vertebral bodies are felt to be secondary to overlying bowel gas.     Expansile lucent lesions are identified involving the left 3rd and 8th  rib and the right scapula.  Left 3rd and right 4th rib fractures are seen. Subtle lucency in the right proximal humerus may represent a focal lesion as  well.       STAGING:     No matching staging information was found for the patient.    ASSESSMENT AND PLAN:     IgG kappa multiple myeloma with several lytic lesions.  Cord compression at T6 and the T11.  Severe back pain.    Temazepam prescribed for insomnia.     He still has significant back pain.  We will continue Percocet.  We will increase MS Contin from 15 mg to 30 mg twice a day.  We discussed about his bowel regimen.  Will continue the same treatment.  He has some nausea.  We will give Zofran.  He will return this Friday for third dose and he will be seen in a week.    He and his wife verbalized the understanding of the plan.     David Canner, MD

## 2016-04-09 ENCOUNTER — Encounter

## 2016-04-09 NOTE — Progress Notes (Signed)
Radiation Oncology Final Therapy Note    Patient:  David Watkins  Date of Birth:  [birth year]    04/09/2016    Pretreatment History:  The patient is a 60 y.o. year old male who has completed palliative radiation for spinal cord compression from his newly dx myeloma.    SiteStart TxLast TxEDFracDoseFxDoseTechnique  Rx:T5-T7 incl:03/20/2016:04/01/2016:12:10/1:3,000/3,000cG:300cG:AP/PA  Rx:T10-L3 incl:03/20/2016:04/01/2016:12:10/1:3,000/3,000cG:300cG:AP/PA  Rx:Rt Scapula:03/20/2016:04/01/2016:12:10/1:3,000/3,000cG:300cG:AP/PA    Date Started:  Urgent 03-20-16    Date Finished:  04-01-16    Site: T5-7 incl  T10-L3 incl  Right scapula    Dose per fraction: 300c Gy    Number of Fractions: 10    Total Dose:  3000c Gy    Technique: 3dcrt        Treatment Course: Tolerated xrt well. Areas we treated improved. Had persistent pain in sacral area    Plan for Follow-up: 3 weeks. Then med onc for chemo. Call with questions.      Arrie Senate, MD

## 2016-04-10 ENCOUNTER — Encounter: Admit: 2016-04-10 | Discharge: 2016-04-10 | Payer: BLUE CROSS/BLUE SHIELD | Primary: Hematology & Oncology

## 2016-04-10 DIAGNOSIS — C9000 Multiple myeloma not having achieved remission: Secondary | ICD-10-CM

## 2016-04-10 LAB — CBC WITH AUTO DIFFERENTIAL
Granulocytes %: 67.4 % (ref 37.0–80.0)
Granulocytes: 3.6 10*3/uL (ref 2.0–7.8)
Hematocrit: 40.7 % — ABNORMAL LOW (ref 43.5–53.7)
Hemoglobin: 13 g/dL — ABNORMAL LOW (ref 14.1–18.1)
Lymphocyte %: 24.7 % (ref 10.0–50.0)
Lymphocytes: 1.3 10*3/uL (ref 0.6–4.1)
MCH: 29.4 pg (ref 27.0–31.2)
MCHC: 31.9 g/dL (ref 31.8–35.4)
MCV: 92.1 fL (ref 80.0–97.0)
Mid Cells %: 7.9 % (ref 0.1–24.0)
Mid Cells: 0.4 10*3/uL (ref 0.0–1.8)
Platelets: 148 10*3/uL (ref 142–424)
RBC: 4.42 M/uL — ABNORMAL LOW (ref 4.69–6.13)
RDW: 15.4 % — ABNORMAL HIGH (ref 11.6–14.8)
WBC: 5.3 10*3/uL (ref 4.6–10.2)

## 2016-04-10 MED ORDER — ONDANSETRON HCL 8 MG PO TABS
8 MG | Freq: Once | ORAL | Status: AC
Start: 2016-04-10 — End: 2016-04-10
  Administered 2016-04-10: 13:00:00 8 mg via ORAL

## 2016-04-10 MED ORDER — BORTEZOMIB 3.5 MG IJ SOLR
3.5 MG | Freq: Once | INTRAMUSCULAR | Status: AC
Start: 2016-04-10 — End: 2016-04-10
  Administered 2016-04-10: 13:00:00 2.75 mg/m2 via SUBCUTANEOUS

## 2016-04-10 MED FILL — ONDANSETRON HCL 8 MG PO TABS: 8 MG | ORAL | Qty: 1

## 2016-04-10 MED FILL — VELCADE 3.5 MG IJ SOLR: 3.5 MG | INTRAMUSCULAR | Qty: 2.75

## 2016-04-10 NOTE — Patient Instructions (Signed)
Clinic Discharge Note     Clinic Discharge Note: Treatment was administered as prescribed without adverse event, with physician oversight.;Universal precautions observed with treatment;Patient instructed to call office with any questions or problems.  Discharge Teaching Instructions.;Verify on MAR all medications due today have been administered and documented on.;Return appointment scheduled;Discharged from clinic.    Patients Receiving  Treatment:  Report to your physician's office immediately or call  [phone]:              1.  Fever of 100.5 or greater.                2.  Vomiting without relief after using anti-nausea/vomiting medicine.                3.  Severe abdominal cramping/diarrhea, 3-5 watery bowel movements in a day.                4.  Pain at the IV site where you received your chemotherapy.                5.  Unusual or excessive bleeding from your mouth, nose, rectum, or vagina;  blood in your urine.                6.  A sudden onset of shortness of breath or chest pain.    Report to your physician's office within 24 hours:              1.  Pain not relieved by your usual pain medication.                2.  Signs of infection; redness, warmth, or swelling.                3.  Unusual bruising of any body area.                4.  Change in breathing patterns such as difficulty breathing or shortness of breath when you are resting.                5.  Change in urination; cloudiness, odor, frequency, or pain on urination.    Report to your physician's office within 48 hours:              1.  Skin changes: rash, hives, itching, redness and peeling of the skin.                2.  Numbness, tingling in the extremities or a persistent ringing in the ears.    Practice good personal hygiene.  If possible, avoid persons with colds or flu-like symptoms.  Drink plenty of fluids while awake and avoid exposure to the sun.  It is not unusual to experience other side effects, such as loss of appetite, altered  taste sensation or depression.

## 2016-04-10 NOTE — Progress Notes (Signed)
Routine Velcade injection     Discharge Checklist  Clinic discharge accompanied by Self    Mode of transportation: Ambulatory    Clinic Discharge Note:  Treatment was admistered as prescribed without adverse event, with physician oversight.  Universal Precautions observed with treatment.  Patient instructed to call office with any questions or problems. Discharge Teaching instruction  Patient Physical Status adequate  Verify on flowchart all medications due today have been administered and documented.  Return appointment scheduled  Discharged from clinic

## 2016-04-13 ENCOUNTER — Encounter: Admit: 2016-04-13 | Discharge: 2016-04-13 | Payer: BLUE CROSS/BLUE SHIELD | Primary: Hematology & Oncology

## 2016-04-13 ENCOUNTER — Ambulatory Visit
Admit: 2016-04-13 | Discharge: 2016-04-13 | Payer: BLUE CROSS/BLUE SHIELD | Attending: Radiation Oncology | Primary: Hematology & Oncology

## 2016-04-13 ENCOUNTER — Ambulatory Visit
Admit: 2016-04-13 | Discharge: 2016-04-13 | Payer: BLUE CROSS/BLUE SHIELD | Attending: Adult Health | Primary: Hematology & Oncology

## 2016-04-13 DIAGNOSIS — C9000 Multiple myeloma not having achieved remission: Secondary | ICD-10-CM

## 2016-04-13 LAB — CBC WITH AUTO DIFFERENTIAL
Granulocytes %: 53 % (ref 37.0–80.0)
Granulocytes: 3.3 10*3/uL (ref 2.0–7.8)
Hematocrit: 38.4 % — ABNORMAL LOW (ref 43.5–53.7)
Hemoglobin: 11.6 g/dL — ABNORMAL LOW (ref 14.1–18.1)
Lymphocyte %: 28.5 % (ref 10.0–50.0)
Lymphocytes: 1.8 10*3/uL (ref 0.6–4.1)
MCH: 28.3 pg (ref 27.0–31.2)
MCHC: 30.2 g/dL — ABNORMAL LOW (ref 31.8–35.4)
MCV: 93.6 fL (ref 80.0–97.0)
Mid Cells %: 18.5 % (ref 0.1–24.0)
Mid Cells: 1.2 10*3/uL (ref 0.0–1.8)
Platelets: 109 10*3/uL — ABNORMAL LOW (ref 142–424)
RBC: 4.1 M/uL — ABNORMAL LOW (ref 4.69–6.13)
RDW: 14.7 % (ref 11.6–14.8)
WBC: 6.3 10*3/uL (ref 4.6–10.2)

## 2016-04-13 MED ORDER — PROCHLORPERAZINE MALEATE 10 MG PO TABS
10 MG | Freq: Once | ORAL | Status: AC
Start: 2016-04-13 — End: 2016-04-13
  Administered 2016-04-13: 15:00:00 10 mg via ORAL

## 2016-04-13 MED ORDER — SODIUM CHLORIDE 0.9 % IV SOLN
0.9 % | Freq: Once | INTRAVENOUS | Status: AC
Start: 2016-04-13 — End: 2016-04-13
  Administered 2016-04-13: 15:00:00 2.75 mg/m2 via SUBCUTANEOUS

## 2016-04-13 MED ORDER — LENALIDOMIDE 25 MG PO CAPS
25 | ORAL_CAPSULE | ORAL | 0 refills | Status: DC
Start: 2016-04-13 — End: 2016-08-26

## 2016-04-13 MED ORDER — REVLIMID 25 MG PO CAPS
25 MG | ORAL_CAPSULE | ORAL | 0 refills | Status: DC
Start: 2016-04-13 — End: 2016-04-13

## 2016-04-13 MED FILL — VELCADE 3.5 MG IJ SOLR: 3.5 MG | INTRAMUSCULAR | Qty: 3.5

## 2016-04-13 MED FILL — PROCHLORPERAZINE MALEATE 10 MG PO TABS: 10 MG | ORAL | Qty: 1

## 2016-04-13 NOTE — Progress Notes (Signed)
RADIATION/ONCOLOGY FOLLOW-UP      Patient:  David Watkins  Date of Birth:  29-Nov-1955    04/13/2016    Primary Oncologist:  Harless Litten, MD    PCP:  Donia Guiles, DO    Chief Complaint:   Chief Complaint   Patient presents with   ??? Follow-up       PROBLEM LIST:       Patient Active Problem List   Diagnosis   ??? Multiple myeloma (HCC)     No matching staging information was found for the patient.    INTERVAL HISTORY:       David Watkins is a 60 y.o. male who presents today for a follow-up visit.  S/p palliative xrt to spine        REVIEW OF SYSTEMS:       CONSTITUTIONAL:  Generally feels well   Appetite good.  Energy level stable.    HEENT:  No sores in the mouth, sore throat, changes in voice quality.    CARDIOVASCULAR:  No chest pain or palpitations.    RESPIRATORY:  No cough, SOB or hemoptysis.  GI: No N/V/D/C.  No blood per rectum.    GU:  No urinary frequency, dysuria or urgency.    MUSCULOSKELETAL:   No specific bone or joint aches.  No edema.    NEUROLOGIC:  Denies HA, dizziness or focal weakness.  No sensory changes.  SKIN: No rashes or itching issues  ENDOCRINE: Denies night sweats or thirst  LYMPHATICS: Denies lower extremity or upper extremity swelling     PHYSICAL EXAM:       BP 120/78   Pulse 72   Temp 97.9 ??F (36.6 ??C) (Oral)    Ht 5' 8" (1.727 m)   Wt 200 lb (90.7 kg)   BMI 30.41 kg/m2  General appearance: alert and cooperative  Head: Normocephalic, without obvious abnormality, atraumatic  Neck: No palpable lymphadenopathy in supraclavicular or cervical chains  Lungs: Clear to auscultation bilaterally, no audible rales, wheezes or crackles  Heart: Regular rate and rhythm, S1, S2 normal  Abdomen: Soft, non-tender; bowel sounds normal; no masses,  no organomegaly  Extremities: without cyanosis, clubbing, edema or asymmetry  Skin: No jaundice, purpura or petechiae neuro nho long tract signs       LABS:         IMAGING:       }    ASSESSMENT      The patient is a  60 y.o. year old male  Neuro ok no  progression of symptoms , Now with consistent and persitent         PLAN:         Palliative  xrt to right rib          Harless Litten, MD

## 2016-04-13 NOTE — Progress Notes (Signed)
[facility] FOLLOW-UP:     Primary Oncologist: Dr. Eulis Watkins  Date: 04/13/2016    PCP: David Guiles, DO  Referring Provider: Mart Piggs, MD  Oncologist: Dr. Eulis Watkins, M.D.; M.S.    PROBLEM LIST:     Patient Active Problem List   Diagnosis   ??? Multiple myeloma [facility])       Specialty Problems        Oncology Problems    Multiple myeloma (HCC)            IgG kappa multiple myeloma with several lytic lesions.  Cord compression at T6 and the T11.  Severe back pain.  Received radiotherapy in 10 fractions.    Current therapy:   VRD, C1D; Status post dexamethasone 40 mg daily for 4 days. Started Velcade, Revlimid, and the dexamethasone on August 4. Monthly Zometa started 03/23/16.    David Watkins returns for follow-up and treatment. He is less anxious. He still has significant back pain especially he gets up. His pain is better controlled with 30 mg MS Contin BID. He continues dexamethasone. He is still not sleeping well. He feels that dex is causing his insomnia. He would like to work from home, but states that he is unable to because he is not sleeping well at night and has difficulty concentrating due to fatigue.     REVIEW OF SYSTEMS:     CONSTITUTIONAL: Denies fever, sweats, weight loss.  Still has significant pain. Fatigue. Insomnia.   EYES: No visual changes or diplopia. No scleral icterus.  ENT: No Headaches, hearing loss or vertigo. No mouth sores or sore throat.  CARDIOVASCULAR: No chest pain, dyspnea on exertion, palpitations or loss of consciousness.   RESPIRATORY: No cough or wheezing, no sputum production. No hemoptysis.    GASTROINTESTINAL: No abdominal pain, appetite loss, blood in stools. No change in bowel habits.  GENITOURINARY: No dysuria, trouble voiding, or hematuria.  MUSCULOSKELETAL: no generalized weakness. No joint complaints.  INTEGUMENTARY: No rash or pruritus.  NEUROLOGICAL: No headache, diplopia. No change in gait, balance, or coordination. No paresthesia.  ENDOCRINE: No  temperature intolerance. No excessive thirst, fluid intake, or urination.   HEMATOLOGIC/LYMPHATIC: No abnormal bruising or ecchymosis, blood clots or swollen lymph nodes.  ALLERGIC/IMMUNOLOGIC: No nasal congestion or hives.     PHYSICAL EXAM:     BP 130/70 (Site: Left Arm, Position: Sitting, Cuff Size: Medium Adult)   Pulse 72   Temp 98.4 ??F (36.9 ??C) (Oral)    Resp 16   Ht 5' 8"  (1.727 m)   Wt 199 lb 6.4 oz (90.4 kg)   BMI 30.32 kg/m2    WEIGHT:   Wt Readings from Last 3 Encounters:   04/13/16 199 lb 6.4 oz (90.4 kg)   04/13/16 200 lb (90.7 kg)   04/10/16 197 lb (89.4 kg)     GENERAL APPEARANCE: alert and cooperative.  Not in acute distress.  HEAD: Normocephalic, without obvious abnormality, atraumatic  NECK: No palpable lymphadenopathy in supraclavicular, axillary or cervical chains  LUNGS: Clear to auscultation bilaterally, no audible rales, wheezes or crackles  HEART: Regular rate and rhythm, S1, S2 normal  ABDOMEN: Soft, non-tender; bowel sounds normal; no masses, no organomegaly  EXTREMITIES: without cyanosis, clubbing, edema or asymmetry.  Tenderness on his back.  SKIN: No jaundice, purpura or petechiae  No focal neurologic deficits.    LABS:     CBC:  Lab Results   Component Value Date  WBC 6.3 04/13/2016    HGB 11.6 (L) 04/13/2016    HCT 38.4 (L) 04/13/2016    MCV 93.6 04/13/2016    PLT 109 (L) 04/13/2016    LABLYMP 1.8 04/13/2016    MID 1.2 04/13/2016    GRAN 3.3 04/13/2016    LYMPHOPCT 28.5 04/13/2016    MIDPERCENT 18.5 04/13/2016    GRANULOCYTES 53.0 04/13/2016    RBC 4.10 (L) 04/13/2016    MCH 28.3 04/13/2016    MCHC 30.2 (L) 04/13/2016    RDW 14.7 04/13/2016       Lab Results   Component Value Date    NA 133 03/16/2016    K 4.5 03/16/2016    CL 98 03/16/2016    CO2 29 03/16/2016    BUN 15 03/16/2016    CREATININE 1.2 03/16/2016    GLUCOSE 109 03/16/2016    CALCIUM 8.8 03/16/2016    PROT 7.7 03/16/2016    PROT 9.8 (H) 03/16/2016    PROT 9.8 (H) 03/16/2016    LABALBU 3.6 03/16/2016    LABALBU 4.84  03/16/2016    BILITOT 0.7 03/16/2016    ALKPHOS 82 03/16/2016    AST 42 (H) 03/16/2016    ALT 30 03/16/2016    LABGLOM >60.0 03/16/2016       No results found for: PTINR    TUMOR MARKERS:    Lab Results   Component Value Date    PSA 0.7 03/16/2016       IMAGING:     Mri Thoracic Spine W Wo Contrast    Result Date: 03/18/2016  EXAMINATION: MRI OF THE THORACIC SPINE WITHOUT AND WITH CONTRAST  03/18/2016 11:48 am TECHNIQUE: Multiplanar multisequence MRI of the thoracic spine was performed without and with the administration of intravenous contrast. COMPARISON: Chest CT 03/10/2016. HISTORY: ORDERING PHYSICIAN PROVIDED HISTORY: Multiple myeloma, remission status unspecified ([place]) TECHNOLOGIST PROVIDED HISTORY: Technologist Provided Reason for Exam: Multiple myeloma, remission status unspecified Acuity: Acute Type of Encounter: Subsequent/Follow-up skeletal survey Additional signs and symptoms: severe back pain Relevant Medical/Surgical History: David Watkins is a 60 y.o. male here today for evaluation of newly discovered lytic lesions.   He presented with cough at the beginning of this year.  He also developed right lower rib pain.  He was later seen by his primary care physician.  Because of the persistent pain he was sent to Dr. Canary Watkins.  He was given steroids.  His initial x-ray was reportedly negative.  However his cough did not go away and his pain got worse. CT of the chest at the [place] on 03/10/2016 showed a 3 mm nodule in the periphery of the left lower lobe.  There is a 3.5 x 3 cm soft tissue mass with bony destruction involving posterior elements of the T6 vertebral body with extension into the thoracic canal with at least 50% narrowing of the diameter.  There are 2 lytic lesions within T11 vertebral body and the left pedicle with mild extent expansion in the less than 25% compromise of the thoracic canal.  There is a 2.3 x 2.3 destructive lesion in the inferolateral aspect of the scapula.  Right seventh and eighth  ribs showed expansile lesions in the left 2, 3, 7 and 8 showed expansile lesions.  His pain is not relieved with Percocet. FINDINGS: BONES/ALIGNMENT: There is normal alignment of the thoracic spine. There are enhancing, T2 hyperintense, expansile masses noted in the right T6 posterior elements which result in mass effect on the right side of the  thoracic spinal cord and results in at least moderate central spinal canal narrowing.  This results in right T5-T6, and T6-T7 foraminal narrowing. Additionally, there is an enhancing mass centered in the left T11 pedicle, resulting in foraminal narrowing at T10-T11, and T11-T12 on the left. Additionally, there are enhancing lesions noted along the left T7 transverse process and pedicle, and along the left T8-T9 inferior endplates.  There is a smaller enhancing lesion noted along the right T11 pedicle and superior articular process.  There is a mildly enhancing lesion noted along the anterior aspect of the T11 vertebral body, compatible with metastatic disease versus myeloma.  There is also an enhancing lesion noted along the L1 superior endplate. There are mildly enhancing lesions noted in the T2 and T3 vertebral bodies. There is no pathologic fracture. SPINAL CORD: The thoracic spinal cord demonstrates normal signal intensity. There is no evidence of an expansile mass lesion. SOFT TISSUES:  Gravity dependent atelectasis is noted in the lungs.  No paraspinal masses are identified. DEGENERATIVE CHANGES: There is mild multilevel degenerative disc disease noted in the thoracic spine.  There is central spinal canal narrowing at T5-T6, and to a lesser extent at the level of the T11 vertebral body.     1. There are multifocal T2 hyperintense, enhancing lesions noted throughout the thoracic spine and the L1 vertebral body which are concerning for metastatic disease versus myeloma.  The largest lesions are noted along the right T6 posterior elements which result in moderate central  spinal canal narrowing, and as well as along the left T11 pedicle, resulting in mild central spinal canal narrowing.  These are consistent with the abnormalities noted on the recent CT of the chest. 2. No pathologic fracture is identified.     Xr Skeletal Survey Complete    Result Date: 03/16/2016  EXAMINATION: COMPLETE BONE SURVEY 03/16/2016 1:19 pm COMPARISON: None. HISTORY: ORDERING PHYSICIAN PROVIDED HISTORY: Multiple myeloma, remission status unspecified ([place]) TECHNOLOGIST PROVIDED HISTORY: Technologist Provided Reason for Exam: Multiple myeloma, remission status unspecified ([place]) Acuity: Unknown Type of Encounter: Unknown FINDINGS: The following x-rays were obtained: AP, lateral skull, AP and lateral cervical spine, AP and lateral thoracic spine, frontal chest, AP right and left ribs, AP and lateral lumbar spine, AP pelvis, AP and lateral right and left femurs, AP and lateral right and left tibia fibula, AP right foot, AP left foot, AP right hand, AP left hand, AP right and left humerus, AP right and left forearm. Sinuses are clear.  Septum is midline.  No focal lucent or sclerotic skull lesions are identified.  Mild diffuse degenerative osteophyte formation is seen along the cervical, thoracic and lumbar spine.  Pedicles well visualized without focal lucent or sclerotic lesion identified.  Left 3rd rib fracture is demonstrated.  Left 8th rib demonstrates an expansile lucent lesion. Right 4th rib fracture is demonstrated.  There is a focal expansile lucent lesion involving the right scapular body.  Spondylolysis is noted at L5 with severe disc height loss at L5-S1.  Bones of the pelvis demonstrate no focal lucent or sclerotic lesions.  The femurs are intact.  Lower extremities are intact.  The feet demonstrate no lucent or sclerotic lesions.  The right proximal humerus demonstrates a subtle lucency which may represent a focal myeloma deposit.  Severe degenerative spurring is seen about the left elbow. The left  humerus is intact.  Left and right forearms demonstrate no focal lucent or sclerotic lesions.  Left and right hands demonstrate no focal lucent or sclerotic  lesions.  Clavicles demonstrate no focal lucent or sclerotic lesion.  Lucencies overlying the lumbar vertebral bodies are felt to be secondary to overlying bowel gas.     Expansile lucent lesions are identified involving the left 3rd and 8th rib and the right scapula.  Left 3rd and right 4th rib fractures are seen. Subtle lucency in the right proximal humerus may represent a focal lesion as well.       STAGING:     No matching staging information was found for the patient.    ASSESSMENT AND PLAN:     IgG kappa multiple myeloma with several lytic lesions. Cord compression at T6 and the T11.    Severe back pain. Continue 30 mg MS contin BID with oxycontin for breakthrough.     Temazepam prescribed for insomnia. He is taking 30 mg nightly.    He will return on 8/25 for C2 of Velcade.     Janeece Fitting, NP

## 2016-04-13 NOTE — Patient Instructions (Signed)
Clinic Discharge Note     Clinic Discharge Note: Treatment was administered as prescribed without adverse event, with physician oversight.;Universal precautions observed with treatment;Patient instructed to call office with any questions or problems.  Discharge Teaching Instructions.;Verify on MAR all medications due today have been administered and documented on.;Return appointment scheduled;Discharged from clinic.    Patients Receiving  Treatment:  Report to your physician's office immediately or call  [phone]:              1.  Fever of 100.5 or greater.                2.  Vomiting without relief after using anti-nausea/vomiting medicine.                3.  Severe abdominal cramping/diarrhea, 3-5 watery bowel movements in a day.                4.  Pain at the IV site where you received your chemotherapy.                5.  Unusual or excessive bleeding from your mouth, nose, rectum, or vagina;  blood in your urine.                6.  A sudden onset of shortness of breath or chest pain.    Report to your physician's office within 24 hours:              1.  Pain not relieved by your usual pain medication.                2.  Signs of infection; redness, warmth, or swelling.                3.  Unusual bruising of any body area.                4.  Change in breathing patterns such as difficulty breathing or shortness of breath when you are resting.                5.  Change in urination; cloudiness, odor, frequency, or pain on urination.    Report to your physician's office within 48 hours:              1.  Skin changes: rash, hives, itching, redness and peeling of the skin.                2.  Numbness, tingling in the extremities or a persistent ringing in the ears.    Practice good personal hygiene.  If possible, avoid persons with colds or flu-like symptoms.  Drink plenty of fluids while awake and avoid exposure to the sun.  It is not unusual to experience other side effects, such as loss of appetite, altered  taste sensation or depression.

## 2016-04-13 NOTE — Telephone Encounter (Signed)
Refill for Revlimid due and sent to [facility].

## 2016-04-13 NOTE — Progress Notes (Signed)
Patient was seen by CNP today .  CNP aware of CBC.  Okay to treat.  Velcade Injection given.  Patient had no complaints.    Discharge Checklist  Clinic discharge accompanied by Family member    Mode of transportation: Ambulatory    Clinic Discharge Note:  Treatment was admistered as prescribed without adverse event, with physician oversight.  Universal Precautions observed with treatment.  Patient instructed to call office with any questions or problems. Discharge Teaching instruction  Patient Physical Status adequate  Verify on flowchart all medications due today have been administered and documented.  Return appointment scheduled  Discharged from clinic

## 2016-04-15 ENCOUNTER — Ambulatory Visit
Admit: 2016-04-15 | Discharge: 2016-04-15 | Payer: BLUE CROSS/BLUE SHIELD | Attending: Radiation Oncology | Primary: Hematology & Oncology

## 2016-04-15 DIAGNOSIS — C9000 Multiple myeloma not having achieved remission: Secondary | ICD-10-CM

## 2016-04-15 NOTE — Progress Notes (Signed)
Simulation Note    Roman Ruether  DOB: [birth year]    Date of service: 04/15/2016    Diagnosis:   Problem List Items Addressed This Visit     None          As requested, a Set up Simulation was performed in preparation for treatments for the patient???s cancer. Dionis Spar was placed in the treatment position utilizing the necessary immobilization devices to ensure a reproducible treatment position. All set-up parameters for the treatment delivery are outlined below. Reference marks were placed on the patient to assist with set-up and delineate the imaged area.     Site:  Right ribs    Patient Position: prone    Immobilization:block and band feet    Markers/Wires: wire scars/tumors    Bladder/Bowel: NA    Contrast: N/A    Imaging: Previous CT    Treatment planning: 3D      A CT simulation was obtained through the delineated treatment area to be utilized during the dosimetry process. CT images will be transferred to the treatment planning system for the purposes of placement of isocenter, determination of portal arrangements and design of necessary blocking. Side effects were reviewed and consent for treatment was obtained.    Dose constraint orders have been submitted to dosimetry.   A dose of 2000 cGy in 400  cGy fractions is planned to the ribs  pending the final dosimetry.    Radiation technician/therapist performing simulation is documented in [facility] films with radiology

## 2016-04-23 ENCOUNTER — Ambulatory Visit: Attending: Radiation Oncology | Primary: Hematology & Oncology

## 2016-04-23 DIAGNOSIS — C9000 Multiple myeloma not having achieved remission: Secondary | ICD-10-CM

## 2016-04-23 MED ORDER — ESZOPICLONE 2 MG PO TABS
2 MG | ORAL_TABLET | Freq: Every evening | ORAL | 3 refills | Status: DC
Start: 2016-04-23 — End: 2016-09-16

## 2016-04-23 NOTE — Progress Notes (Signed)
Weekly Radiation Treatment Progress Note    Current/Total Doses:  2000/2000  Chemo: yes  Current/Total Fractions:  5/5    Treatment Modality:  Electron    Site:  Right post ribs      Chief Complaint:  Pt seen and evaluated for symptom management. VSS. C/O rib and back pain.   Chief Complaint   Patient presents with   . Treatment   . Multiple Myeloma          Subjective:  Rib continues to hurt.  Feels dex is making it difficult for him to sleep. He would like to increase ambien to 10 mg qhs.      EXAM  Vitals:    04/23/16 0928   BP: 111/80   Pulse: 82   Resp: 16   Temp: 97.9 F (36.6 C)   SpO2: 100%     NAD, VSS      Assessment:    Tolerating well.      Plan:   Continue treatment as planned.  Will try increasing Ambien to 10 mg qhs. Also gave Rx for Lunesta if increasing the Lorrin Mais does not help.  Alain Marion, MD

## 2016-04-24 ENCOUNTER — Ambulatory Visit
Admit: 2016-04-24 | Discharge: 2016-04-24 | Payer: BLUE CROSS/BLUE SHIELD | Attending: Hematology & Oncology | Primary: Hematology & Oncology

## 2016-04-24 ENCOUNTER — Encounter: Admit: 2016-04-24 | Discharge: 2016-04-24 | Primary: Hematology & Oncology

## 2016-04-24 DIAGNOSIS — C9000 Multiple myeloma not having achieved remission: Secondary | ICD-10-CM

## 2016-04-24 LAB — COMPREHENSIVE METABOLIC PANEL
ALT: 158 U/L — ABNORMAL HIGH (ref 10–47)
AST: 79 U/L — ABNORMAL HIGH (ref 11–38)
Albumin: 3 G/DL — ABNORMAL LOW (ref 3.3–5.5)
Alk Phosphatase: 90 U/L (ref 53–128)
BUN: 23 MG/DL — ABNORMAL HIGH (ref 7–22)
CO2: 28 mmol/L (ref 18–33)
Calcium: 9.5 MG/DL (ref 8.0–10.3)
Chloride: 107 mmol/L (ref 98–108)
Creatinine: 0.9 MG/DL (ref 0.6–1.2)
GFR Non-African American: >60 mL/min/{1.73_m2} (ref >60.0)
Glucose: 110 MG/DL (ref 73–118)
Potassium: 4.7 mmol/L (ref 3.6–5.1)
Sodium: 138 mmol/L (ref 128–145)
Total Bilirubin: 0.9 MG/DL (ref 0.2–1.6)
Total Protein: 6.6 G/DL (ref 6.4–8.1)
eGFR African American: >60 mL/min/{1.73_m2} (ref >60.0)

## 2016-04-24 LAB — CBC WITH AUTO DIFFERENTIAL
Granulocytes %: 74.6 % (ref 37.0–80.0)
Granulocytes: 2.7 10*3/uL (ref 2.0–7.8)
Hematocrit: 31 % — ABNORMAL LOW (ref 43.5–53.7)
Hemoglobin: 9.1 g/dL — ABNORMAL LOW (ref 14.1–18.1)
Lymphocyte %: 18.7 % (ref 10.0–50.0)
Lymphocytes: 0.7 10*3/uL (ref 0.6–4.1)
MCH: 27.6 pg (ref 27.0–31.2)
MCHC: 29.4 g/dL — ABNORMAL LOW (ref 31.8–35.4)
MCV: 93.8 fL (ref 80.0–97.0)
Mid Cells %: 6.7 % (ref 0.1–24.0)
Mid Cells: 0.2 10*3/uL (ref 0.0–1.8)
Platelets: 230 10*3/uL (ref 142–424)
RBC: 3.3 M/uL — ABNORMAL LOW (ref 4.69–6.13)
RDW: 16.2 % — ABNORMAL HIGH (ref 11.6–14.8)
WBC: 3.6 10*3/uL — ABNORMAL LOW (ref 4.6–10.2)

## 2016-04-24 LAB — PROTEIN, TOTAL: Total Protein: 6.9 gm/dL (ref 6.4–8.2)

## 2016-04-24 MED ORDER — ONDANSETRON HCL 8 MG PO TABS
8 MG | ORAL_TABLET | Freq: Three times a day (TID) | ORAL | 3 refills | Status: DC | PRN
Start: 2016-04-24 — End: 2016-12-15

## 2016-04-24 MED ORDER — DEXAMETHASONE 2 MG PO TABS
2 MG | ORAL_TABLET | Freq: Every day | ORAL | 0 refills | Status: AC
Start: 2016-04-24 — End: 2016-05-24

## 2016-04-24 MED ORDER — DEXAMETHASONE 4 MG PO TABS
4 MG | Freq: Once | ORAL | Status: DC
Start: 2016-04-24 — End: 2016-04-24

## 2016-04-24 MED ORDER — DEXAMETHASONE 4 MG PO TABS
4 MG | Freq: Once | ORAL | Status: AC
Start: 2016-04-24 — End: 2016-04-24
  Administered 2016-04-24: 13:00:00 16 mg via ORAL

## 2016-04-24 MED ORDER — ONDANSETRON HCL 8 MG PO TABS
8 MG | Freq: Once | ORAL | Status: AC
Start: 2016-04-24 — End: 2016-04-24
  Administered 2016-04-24: 13:00:00 8 mg via ORAL

## 2016-04-24 MED ORDER — ZOLEDRONIC ACID 4 MG/5ML IV CONC (MIXTURES ONLY) (OHC)
4 MG/5ML | Freq: Once | INTRAVENOUS | Status: AC
Start: 2016-04-24 — End: 2016-04-24
  Administered 2016-04-24: 14:00:00 4 mg via INTRAVENOUS

## 2016-04-24 MED ORDER — BORTEZOMIB 3.5 MG IJ SOLR
3.5 MG | Freq: Once | INTRAMUSCULAR | Status: AC
Start: 2016-04-24 — End: 2016-04-24
  Administered 2016-04-24: 14:00:00 2.75 mg/m2 via SUBCUTANEOUS

## 2016-04-24 MED FILL — DEXAMETHASONE 4 MG PO TABS: 4 MG | ORAL | Qty: 5

## 2016-04-24 MED FILL — DEXAMETHASONE 4 MG PO TABS: 4 MG | ORAL | Qty: 4

## 2016-04-24 MED FILL — ZOLEDRONIC ACID 4 MG/5ML IV CONC: 4 MG/5ML | INTRAVENOUS | Qty: 5

## 2016-04-24 MED FILL — VELCADE 3.5 MG IJ SOLR: 3.5 MG | INTRAMUSCULAR | Qty: 3.5

## 2016-04-24 MED FILL — ONDANSETRON HCL 8 MG PO TABS: 8 MG | ORAL | Qty: 1

## 2016-04-24 NOTE — Patient Instructions (Signed)
Clinic Discharge Note     Clinic Discharge Note: Treatment was administered as prescribed without adverse event, with physician oversight.;Universal precautions observed with treatment;Patient instructed to call office with any questions or problems.  Discharge Teaching Instructions.;Verify on MAR all medications due today have been administered and documented on.;Return appointment scheduled;Discharged from clinic.    Patients Receiving  Treatment:  Report to your physician's office immediately or call  [phone]:              1.  Fever of 100.5 or greater.                2.  Vomiting without relief after using anti-nausea/vomiting medicine.                3.  Severe abdominal cramping/diarrhea, 3-5 watery bowel movements in a day.                4.  Pain at the IV site where you received your chemotherapy.                5.  Unusual or excessive bleeding from your mouth, nose, rectum, or vagina;  blood in your urine.                6.  A sudden onset of shortness of breath or chest pain.    Report to your physician's office within 24 hours:              1.  Pain not relieved by your usual pain medication.                2.  Signs of infection; redness, warmth, or swelling.                3.  Unusual bruising of any body area.                4.  Change in breathing patterns such as difficulty breathing or shortness of breath when you are resting.                5.  Change in urination; cloudiness, odor, frequency, or pain on urination.    Report to your physician's office within 48 hours:              1.  Skin changes: rash, hives, itching, redness and peeling of the skin.                2.  Numbness, tingling in the extremities or a persistent ringing in the ears.    Practice good personal hygiene.  If possible, avoid persons with colds or flu-like symptoms.  Drink plenty of fluids while awake and avoid exposure to the sun.  It is not unusual to experience other side effects, such as loss of appetite, altered  taste sensation or depression.

## 2016-04-24 NOTE — Progress Notes (Signed)
[facility] FOLLOW-UP:     Primary Oncologist: Dr. Eulis Canner  Date: 04/24/2016    PCP: Donia Guiles, DO  Referring Provider: Mart Piggs, MD  Oncologist: Dr. Eulis Canner, M.D.; M.S.    PROBLEM LIST:     Patient Active Problem List   Diagnosis   ??? Multiple myeloma [facility])       Specialty Problems        Oncology Problems    Multiple myeloma (HCC)            IgG kappa multiple myeloma with several lytic lesions.  Cord compression at T6 and the T11.  Severe back pain.  Received radiotherapy in 10 fractions.    Current therapy:   VRD, C1D; Status post dexamethasone 40 mg daily for 4 days. Started Velcade, Revlimid, and the dexamethasone on August 4. Monthly Zometa started 03/23/16.    Livingston returns for follow-up and treatment. He is less anxious. He currently has been on dexamethasone 4 mg qam x 10 days.  He just finished radiation to his right side of the ribs yesterday.  He complains insomnia.  He has tried different medications including Ambien and Restoril.    REVIEW OF SYSTEMS:     CONSTITUTIONAL: Denies fever, sweats, weight loss.  His pain is less. Fatigue. Insomnia.   EYES: No visual changes or diplopia. No scleral icterus.  ENT: No Headaches, hearing loss or vertigo. No mouth sores or sore throat.  CARDIOVASCULAR: No chest pain, dyspnea on exertion, palpitations or loss of consciousness.   RESPIRATORY: No cough or wheezing, no sputum production. No hemoptysis.    GASTROINTESTINAL: No abdominal pain, appetite loss, blood in stools. No change in bowel habits.  GENITOURINARY: No dysuria, trouble voiding, or hematuria.  MUSCULOSKELETAL: no generalized weakness. No joint complaints.  INTEGUMENTARY: No rash or pruritus.  NEUROLOGICAL: No headache, diplopia. No change in gait, balance, or coordination. No paresthesia.  ENDOCRINE: No temperature intolerance. No excessive thirst, fluid intake, or urination.   HEMATOLOGIC/LYMPHATIC: No abnormal bruising or ecchymosis, blood clots or swollen lymph  nodes.  ALLERGIC/IMMUNOLOGIC: No nasal congestion or hives.     PHYSICAL EXAM:     BP 120/60   Pulse 72   Temp 98.6 ??F (37 ??C) (Oral)    Resp 14   Ht 5' 8" (1.727 m)   Wt 201 lb 9.6 oz (91.4 kg)   BMI 30.65 kg/m2    WEIGHT:   Wt Readings from Last 3 Encounters:   04/24/16 201 lb 9.6 oz (91.4 kg)   04/23/16 200 lb (90.7 kg)   04/13/16 199 lb 6.4 oz (90.4 kg)     GENERAL APPEARANCE: alert and cooperative.  Not in acute distress.  HEAD: Normocephalic, without obvious abnormality, atraumatic  NECK: No palpable lymphadenopathy in supraclavicular, axillary or cervical chains  LUNGS: Clear to auscultation bilaterally, no audible rales, wheezes or crackles  HEART: Regular rate and rhythm, S1, S2 normal  ABDOMEN: Soft, non-tender; bowel sounds normal; no masses, no organomegaly  EXTREMITIES: without cyanosis, clubbing, edema or asymmetry.  Tenderness on his back.  SKIN: No jaundice, purpura or petechiae  No focal neurologic deficits.    LABS:     CBC:  Lab Results   Component Value Date    WBC 6.3 04/13/2016    HGB 11.6 (L) 04/13/2016    HCT 38.4 (L) 04/13/2016    MCV 93.6 04/13/2016    PLT 109 (L) 04/13/2016    LABLYMP 1.8  04/13/2016    MID 1.2 04/13/2016    GRAN 3.3 04/13/2016    LYMPHOPCT 28.5 04/13/2016    MIDPERCENT 18.5 04/13/2016    GRANULOCYTES 53.0 04/13/2016    RBC 4.10 (L) 04/13/2016    MCH 28.3 04/13/2016    MCHC 30.2 (L) 04/13/2016    RDW 14.7 04/13/2016       Lab Results   Component Value Date    NA 133 03/16/2016    K 4.5 03/16/2016    CL 98 03/16/2016    CO2 29 03/16/2016    BUN 15 03/16/2016    CREATININE 1.2 03/16/2016    GLUCOSE 109 03/16/2016    CALCIUM 8.8 03/16/2016    PROT 7.7 03/16/2016    PROT 9.8 (H) 03/16/2016    PROT 9.8 (H) 03/16/2016    LABALBU 3.6 03/16/2016    LABALBU 4.84 03/16/2016    BILITOT 0.7 03/16/2016    ALKPHOS 82 03/16/2016    AST 42 (H) 03/16/2016    ALT 30 03/16/2016    LABGLOM >60.0 03/16/2016       No results found for: PTINR    TUMOR MARKERS:    Lab Results   Component Value  Date    PSA 0.7 03/16/2016       IMAGING:     Mri Thoracic Spine W Wo Contrast    Result Date: 03/18/2016  EXAMINATION: MRI OF THE THORACIC SPINE WITHOUT AND WITH CONTRAST  03/18/2016 11:48 am TECHNIQUE: Multiplanar multisequence MRI of the thoracic spine was performed without and with the administration of intravenous contrast. COMPARISON: Chest CT 03/10/2016. HISTORY: ORDERING PHYSICIAN PROVIDED HISTORY: Multiple myeloma, remission status unspecified ([place]) TECHNOLOGIST PROVIDED HISTORY: Technologist Provided Reason for Exam: Multiple myeloma, remission status unspecified Acuity: Acute Type of Encounter: Subsequent/Follow-up skeletal survey Additional signs and symptoms: severe back pain Relevant Medical/Surgical History: Mr. Mceuen is a 60 y.o. male here today for evaluation of newly discovered lytic lesions.   He presented with cough at the beginning of this year.  He also developed right lower rib pain.  He was later seen by his primary care physician.  Because of the persistent pain he was sent to Dr. Canary Brim.  He was given steroids.  His initial x-ray was reportedly negative.  However his cough did not go away and his pain got worse. CT of the chest at the [facility] on 03/10/2016 showed a 3 mm nodule in the periphery of the left lower lobe.  There is a 3.5 x 3 cm soft tissue mass with bony destruction involving posterior elements of the T6 vertebral body with extension into the thoracic canal with at least 50% narrowing of the diameter.  There are 2 lytic lesions within T11 vertebral body and the left pedicle with mild extent expansion in the less than 25% compromise of the thoracic canal.  There is a 2.3 x 2.3 destructive lesion in the inferolateral aspect of the scapula.  Right seventh and eighth ribs showed expansile lesions in the left 2, 3, 7 and 8 showed expansile lesions.  His pain is not relieved with Percocet. FINDINGS: BONES/ALIGNMENT: There is normal alignment of the thoracic spine. There are enhancing,  T2 hyperintense, expansile masses noted in the right T6 posterior elements which result in mass effect on the right side of the thoracic spinal cord and results in at least moderate central spinal canal narrowing.  This results in right T5-T6, and T6-T7 foraminal narrowing. Additionally, there is an enhancing mass centered in the left T11 pedicle,  resulting in foraminal narrowing at T10-T11, and T11-T12 on the left. Additionally, there are enhancing lesions noted along the left T7 transverse process and pedicle, and along the left T8-T9 inferior endplates.  There is a smaller enhancing lesion noted along the right T11 pedicle and superior articular process.  There is a mildly enhancing lesion noted along the anterior aspect of the T11 vertebral body, compatible with metastatic disease versus myeloma.  There is also an enhancing lesion noted along the L1 superior endplate. There are mildly enhancing lesions noted in the T2 and T3 vertebral bodies. There is no pathologic fracture. SPINAL CORD: The thoracic spinal cord demonstrates normal signal intensity. There is no evidence of an expansile mass lesion. SOFT TISSUES:  Gravity dependent atelectasis is noted in the lungs.  No paraspinal masses are identified. DEGENERATIVE CHANGES: There is mild multilevel degenerative disc disease noted in the thoracic spine.  There is central spinal canal narrowing at T5-T6, and to a lesser extent at the level of the T11 vertebral body.     1. There are multifocal T2 hyperintense, enhancing lesions noted throughout the thoracic spine and the L1 vertebral body which are concerning for metastatic disease versus myeloma.  The largest lesions are noted along the right T6 posterior elements which result in moderate central spinal canal narrowing, and as well as along the left T11 pedicle, resulting in mild central spinal canal narrowing.  These are consistent with the abnormalities noted on the recent CT of the chest. 2. No pathologic  fracture is identified.     Xr Skeletal Survey Complete    Result Date: 03/16/2016  EXAMINATION: COMPLETE BONE SURVEY 03/16/2016 1:19 pm COMPARISON: None. HISTORY: ORDERING PHYSICIAN PROVIDED HISTORY: Multiple myeloma, remission status unspecified ([place]) TECHNOLOGIST PROVIDED HISTORY: Technologist Provided Reason for Exam: Multiple myeloma, remission status unspecified ([place]) Acuity: Unknown Type of Encounter: Unknown FINDINGS: The following x-rays were obtained: AP, lateral skull, AP and lateral cervical spine, AP and lateral thoracic spine, frontal chest, AP right and left ribs, AP and lateral lumbar spine, AP pelvis, AP and lateral right and left femurs, AP and lateral right and left tibia fibula, AP right foot, AP left foot, AP right hand, AP left hand, AP right and left humerus, AP right and left forearm. Sinuses are clear.  Septum is midline.  No focal lucent or sclerotic skull lesions are identified.  Mild diffuse degenerative osteophyte formation is seen along the cervical, thoracic and lumbar spine.  Pedicles well visualized without focal lucent or sclerotic lesion identified.  Left 3rd rib fracture is demonstrated.  Left 8th rib demonstrates an expansile lucent lesion. Right 4th rib fracture is demonstrated.  There is a focal expansile lucent lesion involving the right scapular body.  Spondylolysis is noted at L5 with severe disc height loss at L5-S1.  Bones of the pelvis demonstrate no focal lucent or sclerotic lesions.  The femurs are intact.  Lower extremities are intact.  The feet demonstrate no lucent or sclerotic lesions.  The right proximal humerus demonstrates a subtle lucency which may represent a focal myeloma deposit.  Severe degenerative spurring is seen about the left elbow. The left humerus is intact.  Left and right forearms demonstrate no focal lucent or sclerotic lesions.  Left and right hands demonstrate no focal lucent or sclerotic lesions.  Clavicles demonstrate no focal lucent or sclerotic  lesion.  Lucencies overlying the lumbar vertebral bodies are felt to be secondary to overlying bowel gas.     Expansile lucent lesions are identified  involving the left 3rd and 8th rib and the right scapula.  Left 3rd and right 4th rib fractures are seen. Subtle lucency in the right proximal humerus may represent a focal lesion as well.       STAGING:     No matching staging information was found for the patient.    ASSESSMENT AND PLAN:     IgG kappa multiple myeloma with several lytic lesions. Cord compression at T6 and the T11.    Severe back pain and rib pain.  Continue 30 mg MS contin BID with oxycontin for breakthrough.  He just had a radiation.  His pain is less.    Insomnia.  He will try Ambien again.    We will proceed with the second cycle of VRD.  He will get dexamethasone 20 mg a day of Velcade.  We will decrease his dexamethasone to 2 mg in the morning.  Eventually we will taper that off.  He is due for Zometa today also.  We will repeat his myeloma panel.  He will return next Monday for treatment and he will be seen in a week.    Eulis Canner, MD

## 2016-04-24 NOTE — Progress Notes (Signed)
0910 PT arrives to [facility] for Zometa infusion and Velcade injection today. PT is feeling well with no new concerns.   0915 PO medications given.  Pt states he cannot take the 20mg  of decadron because he took "4mg  at home". Pt states he is willing to take 16mg  of Decadron.   0920 PIV access obtained, blood return verified. Labs drawn and sent.  Patient Condition:  alert    Access Statements  IV fluid infuse well via pump    IV Push ended at n/a  0956 Velcade administered, left arm  1014 Zometa started, blood return verified    1036 Pt tolerated infusion well today. PT will return as scheduled.        Discharge Checklist  Clinic discharge accompanied by Self    Mode of transportation: Ambulatory    Clinic Discharge Note:  Treatment was admistered as prescribed without adverse event, with physician oversight.  Universal Precautions observed with treatment.  Patient instructed to call office with any questions or problems. Discharge Teaching instruction  Patient Physical Status adequate  Verify on flowchart all medications due today have been administered and documented.  Return appointment scheduled  Discharged from clinic

## 2016-04-27 ENCOUNTER — Encounter: Primary: Hematology & Oncology

## 2016-05-01 ENCOUNTER — Encounter: Attending: Adult Health | Primary: Hematology & Oncology

## 2016-05-01 ENCOUNTER — Encounter: Primary: Hematology & Oncology

## 2016-06-22 NOTE — Progress Notes (Signed)
Benjamin Stain Transplant consultation visit-Autologous   I met with Golda Acre  to discuss the preliminary information about autologous stem cell transplantation.  The plan will be Neupogen +/- Mozobil mobilization and followed by High dose chemotherapy and autologous stem cell transplant.  We reviewed  1.  The overview of services provided at [facility] and the [facility].  2. The fundamentals of stem cells, the types of stem cell transplants and their mechanism to treat disease.  3. The stem cell evaluation process, the mobilization process, the high dose chemotherapy / stem cell transplant and the recovery process.  I have reviewed and given them the transplant process outline and the autologous transplant patient???s booklet..  They have given me permission to contact their insurance company to verify insurance coverage for transplant at [facility].  I have answered their question and they will return on 06/29/16.   They have verbalized understanding and agree to the plan.  Barbie Banner RN BSN OCN BMTCN [facility]  Transplant coordinator

## 2016-06-29 NOTE — Progress Notes (Signed)
Benjamin Stain Cycle 5 Day 4 Velcade/Dex  Is accompanied by his significant other , [name].   He is here today for a BM BX, Myeloma serum and urine testing ,PET scan and Velcade treatment.  We reviewed the plan and was present for the biopsy.  He is scheduled to return on 07/06/16 to review results and determine the plan.  Barbie Banner RN BSN  OCN  [place]  Transplant Coordinator  [facility]

## 2016-07-06 NOTE — Progress Notes (Unsigned)
David Watkins is here today with his SO [name].  Dr. Lynnette Caffey reviewed the Myeloma restaging results and determined that he has achieved a partial remission.  Therefore he is ready to proceed with the autologous transplant evaluation and education.  I reviewed the general transplant plan  Outline.   I reviewed the pre transplant homework. He will complete this and return it next week.  I will contact him with the evaluation plan once it is established.   They asked multiple questions, they were answered and they verbalized understanding.   Marcy Panning RN BSN OCN BMTCN [facility]  Transplant coordinator   [facility]

## 2016-07-07 ENCOUNTER — Encounter

## 2016-07-13 ENCOUNTER — Inpatient Hospital Stay: Attending: Hematology & Oncology | Primary: Hematology & Oncology

## 2016-07-13 ENCOUNTER — Inpatient Hospital Stay: Admit: 2016-07-13 | Attending: Hematology & Oncology | Primary: Hematology & Oncology

## 2016-07-13 ENCOUNTER — Ambulatory Visit: Admit: 2016-07-13 | Primary: Hematology & Oncology

## 2016-07-13 ENCOUNTER — Encounter: Primary: Hematology & Oncology

## 2016-07-13 DIAGNOSIS — C9000 Multiple myeloma not having achieved remission: Secondary | ICD-10-CM

## 2016-07-13 LAB — COMPREHENSIVE METABOLIC PANEL
ALT: 30 U/L (ref 10–40)
AST: 22 U/L (ref 15–37)
Albumin/Globulin Ratio: 1.5 (ref 1.1–2.2)
Albumin: 3.9 g/dL (ref 3.4–5.0)
Alkaline Phosphatase: 57 U/L (ref 40–129)
Anion Gap: 10 (ref 3–16)
BUN: 16 mg/dL (ref 7–20)
CO2: 29 mmol/L (ref 21–32)
Calcium: 8.9 mg/dL (ref 8.3–10.6)
Chloride: 102 mmol/L (ref 99–110)
Creatinine: 0.9 mg/dL (ref 0.9–1.3)
GFR African American: >60 (ref >60)
GFR Non-African American: >60 (ref >60)
Globulin: 2.6 g/dL
Glucose: 99 mg/dL (ref 70–99)
Potassium: 4.2 mmol/L (ref 3.5–5.1)
Sodium: 141 mmol/L (ref 136–145)
Total Bilirubin: 0.3 mg/dL (ref 0.0–1.0)
Total Protein: 6.5 g/dL (ref 6.4–8.2)

## 2016-07-13 LAB — CBC WITH AUTO DIFFERENTIAL
Basophils %: 0.9 %
Basophils Absolute: 0.1 10*3/uL (ref 0.0–0.2)
Eosinophils %: 7.3 %
Eosinophils Absolute: 0.6 10*3/uL (ref 0.0–0.6)
Hematocrit: 34.6 % — ABNORMAL LOW (ref 40.5–52.5)
Hemoglobin: 11.5 g/dL — ABNORMAL LOW (ref 13.5–17.5)
Lymphocytes %: 5.6 %
Lymphocytes Absolute: 0.5 10*3/uL — ABNORMAL LOW (ref 1.0–5.1)
MCH: 31 pg (ref 26.0–34.0)
MCHC: 33.3 g/dL (ref 31.0–36.0)
MCV: 93 fL (ref 80.0–100.0)
MPV: 8 fL (ref 5.0–10.5)
Monocytes %: 10.3 %
Monocytes Absolute: 0.9 10*3/uL (ref 0.0–1.3)
Neutrophils %: 75.9 %
Neutrophils Absolute: 6.4 10*3/uL (ref 1.7–7.7)
Platelets: 264 10*3/uL (ref 135–450)
RBC: 3.72 M/uL — ABNORMAL LOW (ref 4.20–5.90)
RDW: 15.9 % — ABNORMAL HIGH (ref 12.4–15.4)
WBC: 8.4 10*3/uL (ref 4.0–11.0)

## 2016-07-13 LAB — TYPE AND SCREEN
ABO/Rh: O POS
Antibody Screen: NEGATIVE

## 2016-07-13 LAB — LACTATE DEHYDROGENASE: LD: 200 U/L — ABNORMAL HIGH (ref 100–190)

## 2016-07-13 LAB — BILIRUBIN TOTAL DIRECT & INDIRECT: Bilirubin, Direct: <0.2 mg/dL (ref 0.0–0.3)

## 2016-07-13 LAB — PROTIME-INR
INR: 0.98 (ref 0.85–1.15)
Protime: 11.1 s (ref 9.6–13.0)

## 2016-07-13 LAB — ECHOCARDIOGRAM COMPLETE 2D W DOPPLER W COLOR: Left Ventricular Ejection Fraction: 58

## 2016-07-13 LAB — URIC ACID: Uric Acid, Serum: 4.7 mg/dL (ref 3.5–7.2)

## 2016-07-13 LAB — PHOSPHORUS: Phosphorus: 3.7 mg/dL (ref 2.5–4.9)

## 2016-07-13 LAB — APTT: aPTT: 28.6 s (ref 24.1–34.9)

## 2016-07-13 MED ORDER — ALBUTEROL SULFATE (2.5 MG/3ML) 0.083% IN NEBU
Freq: Once | RESPIRATORY_TRACT | Status: AC
Start: 2016-07-13 — End: 2016-07-13
  Administered 2016-07-13: 21:00:00 2.5 mg via RESPIRATORY_TRACT

## 2016-07-13 NOTE — Progress Notes (Signed)
David Watkins  Patient is here today for Pre autologous transplant teaching.  He is  accompanied by a significant friend.  Using the [facility] Autologous transplant manual we reviewed and discussed   1.Central intravenous catheter placement and the required catheter care.  2. The stem cell  mobilization plan calendar in depth and noted the days that the patient would need caregiver assistance and transportation.  3.Explained Neupogen and Mozobil mobilization , the expected and possible side effects and care strategies.  4. Reviewed  High dose chemotherapy and the stem cell infusion plan and  the expected and possible side effects. Including Pancytopenia, Gastrointestinal symptoms and how these are best managed.  5. Reviewed post discharge process and the caregiver responsibilities .  6 pre transplant homework was reviewed and  I witnessed the signing of the documents.  They asked multiple questions, they were answered & they verbalized understanding.   He will have his pre transplant evaluation today  Barbie Banner RN BSN [place]  Transplant coordinator   [facility]

## 2016-07-13 NOTE — Progress Notes (Signed)
Pt here for BMT teaching with Barbie Banner RN, transplant coordinator

## 2016-07-13 NOTE — Procedures (Signed)
[facility]                 [address] [place] [address]                                PULMONARY FUNCTION    PATIENT NAME: David Watkins, David Watkins                      DOB:        1956-07-18  MED REC NO:   [id number]                          ROOM:  ACCOUNT NO:   [id number]                          ADMIT DATE: 07/13/2016  PROVIDER:     Sherril Cong, MD    DATE OF PROCEDURE:  07/13/2016    Spirometry data is acceptable and reproducible.  Diffusion capacity is  adjusted for hemoglobin of 11.5 by the Dinakara method.  Oxygen saturation  is 98%.  Of note, the patient was unable to complete the plethysmography  due to extreme claustrophobia.    SPIROMETRY:  FEV1 to FVC ratio is 44%.  FEV1 is 1.48, which is 43% of  predicted.  FVC is 3.34, which is 74% of predicted.  Post-bronchodilator  FEV1 is 1.79, which is 52% of predicted for 20% increase.    Total lung capacity and residual volume were not performed.    Diffusion capacity was 29.49, which is 90% of predicted.    IMPRESSION:  1.  Severe obstructive defect.  2.  There is a significant response to bronchodilators in small and large  airways.  3.  I am unable to assess for a restrictive defect.  4.  Normal diffusion capacity.        Sherril Cong, MD    D: 07/17/2016 9:34:15       T: 07/17/2016 12:44:23     [email or web address]  Job#: [id number]     Doc#: [id number]

## 2016-07-13 NOTE — Behavioral Health Treatment Team (Signed)
Pre-transplant Psychological Evaluation, Eissa Buchberger, 07/14/2016  Screening Measures: FACT-MM, TERS   Reason for Referral:  Mr. Rathbone was interviewed in preparation for his autologous stem cell transplant for multiple myeloma.  He was diagnosed in July 2017 after months of back and shoulder pain and found to have extensive lesions and fractures in his ribs and spine.  He received radiation to his spine and started on Revlimid, Velcade and dexamethasone.  He developed a rash on Revlimid in the 3rd cycle and switched to Pomalidomide but developed a rash with the last dose.  His most recent therapy was Bortezomib, which has been discontinued in preparation for transplant.  He continues to suffer bone pain and has difficulty walking and finding a comfortable position.  He relies on long-acting morphine and Percocet for pain.    Mental Status/Appearance/Coping Style: Mr. Dismore is a 60 year old Caucasian man of who walks with a shuffling gait and says he is in constant pain.  He expressed frustration with his condition and anger that he has to deal with his cancer at this point in his life.  He says he is ???miserable??? and that this has been the worst year of his life.  Mr. Mau describes himself as a ???worry wart??? and a type A personality.  He is frustrated over needing to ask his brothers for help with his care after transplant and was angry and distressed when he was told he would be in the hospital for closer to 2 weeks than 3.  He says that 3 weeks would be better and asked if he couldn???t just stay in the hospital for an extra week so his potential caregivers could get through Christmas.  Mr. Vanaman has few other sources of emotional support and care, since he has not been in [place] for long and is going through a divorce.  His brothers all live in other states and his mother is ???old and walks like a penguin.???  Mr. Temme is [place] and says he is a ???bad one,??? usually only attending church on holidays.  Despite not  attending church, Mr. Creson says he has found prayer to be comforting at this time in his life.    Social/Family History/Support System:  Mr. Zielke is the second of 5 boys born to his parents in [place], .  His father, who also worked for the railroad, was not much involved with the family after their parents divorced when all 25 boys were very young.  His mother waitressed to support the family and Mr. Hershberger didn???t see his father again until he was an adult.  His mother is now 21 and still lives in [place].  His brother [name] lives in [place], [place] lives in [place], [place] lives in [place], and [place] lives in [place].  Mr. Dung says he feels like he barely knows Richardson Landry because he is 30 years younger, so doesn???t feel he can ask him to assist as a caregiver.  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  He married the first time right after high school and has 2 children from that marriage: Ovid Curd is 41 and lives in [place], [place], and [place] lives in [place], [place].  He was divorced in 67 and married for 5 years to his second wife.  That marriage produced 2 children: Rodman Key is 51 and lives in [place], and [place] is 83 and lives in [place].  His current wife is Georgina Snell, and they have been married 20 years  with 2 children: Coulton is a Paramedic in [place] is a Equities trader.  The family moved to [place], [place] from [place] in 2014.  On February 02, 2016, Georgina Snell told him she was leaving him for another man and since then they have lived separately in the same house.  They are trying to keep things normal and civil for the kids, but Mr. Axel says life at home is anything but normal.  He says the kids are doing well despite the tension at home.  Both boys will be away for 2 weeks over Christmas with their mother.    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  His is the 4th generation of his family  to work for the railroad.  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  He has been working as much as he can since his diagnosis, although traveling as typically required for his job has been difficult.    Quality of Life Issues (Body Image, hair loss, sexual functioning):  Mr. Nixon says he has extreme fatigue and weakness and struggles with pain on a constant basis.  He endorsed insomnia, and is struggling with mood and worry.  He endorsed most of his complaints and concerns at the highest level of distress.    History/Current or Projected Mental Health Issues/substance use:  Mr. Torosyan says he is sad about getting divorced and angry at his wife for leaving him without help during this miserable time.  He finds himself turning to anger to deal with his distress.  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  He is generally physically so miserable from pain and weakness that he feels he has very poor quality of life.  He quit smoking 3 years ago and is a social drinker.    Assessment and Plan: Mr. Travelstead presents with moderate barriers to transplant.  He really has only his brothers to ask for assistance in caregiving post-transplant and essentially will have no support during transplant since his sons will be out of town and he has no family here.  He thinks he will be able to take care of himself and drive to the hospital, relying on his sons who are in school and busy with sports after January 1 when they return from vacation.  He was encouraged to ask his brothers for help and arrange for several weeks of support after transplant, the timing of which at this point may in fact get him discharged right about Christmas.  He will be receiving frequent visits from psychology with intervention as needed.     [place], Psy.D, ABPP

## 2016-07-14 LAB — EKG 12-LEAD
Atrial Rate: 73 {beats}/min
Diagnosis: NORMAL
P Axis: 47 degrees
P-R Interval: 140 ms
Q-T Interval: 378 ms
QRS Duration: 96 ms
QTc Calculation (Bazett): 416 ms
R Axis: 73 degrees
T Axis: 65 degrees
Ventricular Rate: 73 {beats}/min

## 2016-07-14 LAB — SICKLE CELL SCREEN: Sickle Cell Screen: NEGATIVE

## 2016-07-14 LAB — HEPATITIS A ANTIBODY, IGM: Hep A IgM: NONREACTIVE

## 2016-07-14 LAB — HEPATITIS B SURFACE ANTIBODY: Hep B S Ab: <3.5 m[IU]/mL

## 2016-07-15 NOTE — Telephone Encounter (Signed)
Lattie Haw w/ [facility] states patient's appointment scheduled for 09/03/2016 should be scheduled for 08/03/2016. Please advise.    Lattie Haw [phone]

## 2016-07-16 LAB — VARICELLA ZOSTER ANTIBODY, IGG: Varicella Zoster Ab IgG: 454 IV

## 2016-07-16 LAB — HSV NON-SPECIFIC ANTIBODY, IGG: Herpes Type I/II IgG Combined: 3.21 IV

## 2016-07-16 LAB — HSV NON-SPECIFIC ANTIBODY, IGM: Herpes Type 1/2 IgM Combined: 0.34 IV (ref <=0.89)

## 2016-07-16 LAB — VARICELLA ZOSTER ANTIBODY, IGM: Varicella Zoster Ab IgM: 0.07 {ISR} (ref <=0.90)

## 2016-07-16 LAB — CYTOMEGALOVIRUS ANTIBODY, IGM: CMV IgM: <8 AU/mL (ref <=29.9)

## 2016-07-16 LAB — HEPATITIS B E ANTIBODY: Hep B E Ab: NEGATIVE

## 2016-07-16 NOTE — Telephone Encounter (Signed)
Surgery order fixed and refaxed

## 2016-07-22 NOTE — Progress Notes (Signed)
I met with Golda Acre for his candidacy visit and exam.  We reviewed the transplant calendar.  His plan  is Neupogen +/- Mozobil mobilization , High dose Melphalan & Autologous transplant.   This is to be potentially followed by Maintenance therapy.  We discussed the Mobilization schedule, infusion schedule, side effects and care strategies to decreased risk.  We reviewed his medications and I updated the medication and allergy profile.  We discussed the need for the tunnel triple lumen Trifusion catheter and care procedures.  He verbalized understanding of the transplant experience and post transplant follow up care.   We have identified some issues that need to be resolved before proceeding ahead.   He is in need of committed caregivers, he has contacted his brothers who live out of town , they have agreed to help.  I will contact them to educate the on the process and the caregiver role.   He is in need of a pulmonary consultation/clearance, cardiac consultation/clearance and pain specialist consultation.   He has agreed to these appointments I will set these up as soon as possible.  I have answered his questions and he agrees to the plan.   He  will return once we have these issues resolved.  Barbie Banner RN  Transplant coordinator

## 2016-07-29 ENCOUNTER — Inpatient Hospital Stay: Attending: Hematology & Oncology | Primary: Hematology & Oncology

## 2016-07-29 NOTE — Progress Notes (Signed)
David Watkins family meeting- Conference call- Time 70 minutes.  Attendees - [name] and Heath Gold, [place] and Lafe Garin, Cindy Horowitz and [place].   Pre autologous transplant teaching and caregiver responsibilities.  Using the [facility] Autologous transplant manual we reviewed and discussed   1.Central intravenous catheter placement and the required catheter care.  2. The stem cell  mobilization plan calendar in depth and noted the days that the patient would need caregiver assistance and transportation.  3.Explained Neupogen and Mozobil mobilization , the expected and possible side effects and care strategies.  4. Reviewed  High dose chemotherapy and the stem cell infusion plan and  the expected and possible side effects. Including Pancytopenia, Gastrointestinal symptoms and how these are best managed.  5. Reviewed post discharge process and the caregiver responsibilities   They asked multiple questions,they were answered and understanding was verbalized.  The family has made a verbal commitment to fill the caregiver role. They have each taken specific days be here and care for him. I am confident he will has the caregivers that he needs to successfully complete the transplant process.  Barbie Banner RN BSN OCN [place]  Transplant coordinator   [facility]

## 2016-07-29 NOTE — Progress Notes (Signed)
David Watkins comes today by himself for pretransplant  preparation and teaching .  He saw the social worker to complete advanced directives.  Using the [facility] Autologous transplant manual we reviewed and discussed   1.Central intravenous catheter placement and the required catheter care.  2. The stem cell  mobilization plan calendar in depth and noted the days that the patient would need caregiver assistance and transportation.  3.Explained Neupogen and Mozobil mobilization , the expected and possible side effects and care strategies.  4. Reviewed  High dose chemotherapy,the stem cell infusion plan and  the expected and possible side effects.                    . Including Pancytopenia, Gastrointestinal symptoms and how these are best managed.  5. Reviewed post discharge process and the caregiver responsibilities.   The outstanding issue is caregivers. I will have a conference call with his out of town sibling today to explain the transplant process and the caregiver roles. My goal is to have them commit to taking care of their brother post transplant.  Additionally he is scheduled to see the cardiologist and pulmonologist for evaluation and clearance next week.  We are working on getting a consultation with a pain specialist.  He was seen by Dr. Hunt Oris. Consents were presented and I witnessed the signing of the mobilization consent.  Barbie Banner RN BSN OCN [place]  Transplant coordinator   [facility]

## 2016-07-29 NOTE — Progress Notes (Signed)
[facility] [address] [place], [place] [address]    Acknowledgment of Informed Consent for Surgical or Medical Procedure and Sedation  I agree to allow doctor(s) CORY BARRAT and his/her associates or assistants, including residents and/or other qualified medical practitioner to perform the following medical treatment or procedure and to administer or direct the administration of sedation as necessary:  Procedure(s): TRIFUSION CATHETER PLACEMENT  My doctor has explained the following regarding the proposed procedure:  ??? the explanation of the procedure  ??? the benefits of the procedure  ??? the potential problems that might occur during recuperation  ??? the risks and side effects of the procedure which could include but are not limited to severe blood loss, infection, stroke or death  ??? the benefits, risks and side effect of alternative procedures including the consequences of declining this procedure or any alternative procedures  ??? the likelihood of achieving satisfactory results.  I acknowledge no guarantee or assurance has been made to me regarding the results.    I understand that during the course of this treatment/procedure, unforeseen conditions can occur which require an additional or different procedure.  I agree to allow my physician or assistants to perform such extension of the original procedure as they may find necessary.    I understand that sedation will often result in temporary impairment of memory and fine motor skills and that sedation can occasionally progress to a state of deep sedation or general anesthesia.    I understand the risks of anesthesia for surgery include, but are not limited to, sore throat, hoarseness, injury to face, mouth, or teeth; nausea; headache; injury to blood vessels or nerves; death, brain damage, or paralysis.    I understand that if I have a Limitation of Treatment order in effect during my hospitalization, the order may or may not be in  effect during this procedure.     I give my doctor permission to give me blood or blood products.  I understand that there are risks with receiving blood such as hepatitis, AIDS, fever, or allergic reaction.  I acknowledge that the risks, benefits, and alternatives of this treatment have been explained to me and that no express or implied warranty has been given by the hospital, any blood bank, or any person or entity as to the blood or blood components transfused.    At the discretion of my doctor, I agree to allow observers, equipment/product representatives and allow photographing, and/or televising of the procedure, provided my name or identity is maintained confidentially.      I agree the hospital may dispose of or use for scientific or educational purposes any tissue, fluid, or body parts which may be removed.    ________________________________Date________Time______ am/pm  (Circle One)  Patient or Signature of Closest Relative or Legal Guardian    ________________________________Date________Time______am/pm      Page 1 of  1  Witness

## 2016-07-29 NOTE — Telephone Encounter (Signed)
Surgery scheduling notified.

## 2016-07-29 NOTE — Progress Notes (Signed)
Labs per [facility].

## 2016-07-29 NOTE — Telephone Encounter (Signed)
Lattie Haw with [facility] called to cancel line placement scheduled 08/03/16. The patient is not ready to do this. Lattie Haw may be reached @ [phone].

## 2016-08-03 ENCOUNTER — Inpatient Hospital Stay: Attending: Surgery | Primary: Hematology & Oncology

## 2016-08-03 ENCOUNTER — Encounter: Primary: Hematology & Oncology

## 2016-08-05 ENCOUNTER — Ambulatory Visit
Admit: 2016-08-05 | Discharge: 2016-08-05 | Payer: BLUE CROSS/BLUE SHIELD | Attending: Cardiovascular Disease | Primary: Hematology & Oncology

## 2016-08-05 DIAGNOSIS — Z01818 Encounter for other preprocedural examination: Secondary | ICD-10-CM

## 2016-08-05 NOTE — Progress Notes (Signed)
Subjective:      Patient ID: David Watkins is a 60 y.o. male.    HPI  Here for pre op evaluation.  Prior to BMT for MM.  No previous cardiac hx.  No exertional chest pain.  Some DOE but related to deconditioning.   No pnd   No orthopnea.  No palp/tachycardia. No syncope.     Past Medical History:   Diagnosis Date   ??? Hyperlipidemia    ??? Hypertension    ??? Multiple myeloma ([place])      Past Surgical History:   Procedure Laterality Date   ??? BONE MARROW BIOPSY       Social History     Social History   ??? Marital status: Married     Spouse name: N/A   ??? Number of children: N/A   ??? Years of education: N/A     Occupational History   ??? Not on file.     Social History Main Topics   ??? Smoking status: Former Smoker     Quit date: 11/25/2012   ??? Smokeless tobacco: Never Used   ??? Alcohol use Not on file   ??? Drug use: Unknown   ??? Sexual activity: Not on file     Other Topics Concern   ??? Not on file     Social History Narrative   ??? No narrative on file     FH reviewed.  Has 2 bros with MI.     Review of Systems   Constitutional: Negative for activity change and fatigue.   Respiratory: Negative for apnea, cough, choking, chest tightness and shortness of breath.    Cardiovascular: Negative for chest pain, palpitations and leg swelling.        No PND or orthopnea.  No tachycardia.   Gastrointestinal: Negative for abdominal distention.   Musculoskeletal: Positive for back pain. Negative for myalgias.        Rib pain   Neurological: Negative for dizziness, syncope and light-headedness.   Psychiatric/Behavioral: Negative for agitation, behavioral problems and confusion.   All other systems reviewed and are negative.      Objective:   Physical Exam   Constitutional: He is oriented to person, place, and time. He appears well-developed and well-nourished. No distress.   HENT:   Head: Normocephalic and atraumatic.   Eyes: Conjunctivae and EOM are normal. Right eye exhibits no discharge. Left eye exhibits no discharge.   Neck: Normal range of  motion. Neck supple. No JVD present.   Cardiovascular: Normal rate, regular rhythm and normal heart sounds.  Exam reveals no gallop.    No murmur heard.  Pulmonary/Chest: Effort normal and breath sounds normal. No respiratory distress. He has no wheezes. He has no rales.   Abdominal: Soft. Bowel sounds are normal. There is no tenderness.   Musculoskeletal: Normal range of motion. He exhibits no edema.   Neurological: He is alert and oriented to person, place, and time.   Skin: Skin is warm and dry.   Psychiatric: He has a normal mood and affect. His behavior is normal. Thought content normal.       Assessment:      1. Pre-op evaluation     2. Essential hypertension     3. Hyperlipidemia, unspecified hyperlipidemia type             Plan:       He is in need of BMT for MM.  He has no sx of angina.  No evidence of  CHF.  EKG 11/17 shows NSR, WNL.  Reviewed previous records and testing including echo 11/17. Normal LV function. Question of paradoxical septal motion but fxn intact.  Since no previous cardiac hx and asx he would be reasonable candidate for BMT.

## 2016-08-06 ENCOUNTER — Ambulatory Visit
Admit: 2016-08-06 | Discharge: 2016-08-06 | Payer: BLUE CROSS/BLUE SHIELD | Attending: Pulmonary Disease | Primary: Hematology & Oncology

## 2016-08-06 DIAGNOSIS — C9000 Multiple myeloma not having achieved remission: Secondary | ICD-10-CM

## 2016-08-06 MED ORDER — UMECLIDINIUM-VILANTEROL 62.5-25 MCG/INH IN AEPB
Freq: Every day | RESPIRATORY_TRACT | 3 refills | Status: DC
Start: 2016-08-06 — End: 2017-03-23

## 2016-08-06 MED ORDER — ALBUTEROL SULFATE HFA 108 (90 BASE) MCG/ACT IN AERS
108 (90 Base) MCG/ACT | Freq: Four times a day (QID) | RESPIRATORY_TRACT | 3 refills | Status: DC | PRN
Start: 2016-08-06 — End: 2017-03-04

## 2016-08-06 NOTE — Progress Notes (Signed)
[facility]    Outpatient Initial Note    Subjective:   Referring Physician: Essel    CHIEF COMPLAINT / HPI:     The patient is 60 y.o. male who presents today for a new patient visit for stem cell Transplant evaluation.  Patient has a long smoking history but quit in 2014.  He was diagnosed with multiple myeloma this past July after having a pathologic rib fracture playing golf in June.  He thus far has undergone several rounds of chemotherapy and radiation and is anticipating getting admitted for an autotransplant on December 26.  Patient is a bit deconditioned after all the chemotherapy and all the bone pain that he has.  He comes in today feeling sore everywhere after his Zometa injection.  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  He had pulmonary function testing prior to the transplant which came back as abnormal.  The testing was incomplete as he had a panic attack while in the body box.  Patient states that he does well going up stairs he just has to go slower that he used to.  He went up the stairs prior to coming to this office visit because he is afraid of getting elevators.  He previously was trapped in an elevator for 6 hours.       Past Medical History:    Past Medical History:   Diagnosis Date   ??? Hyperlipidemia    ??? Hypertension    ??? Multiple myeloma (HCC)        Social History:    History   Smoking Status   ??? Former Smoker   ??? Quit date: 11/25/2012   Smokeless Tobacco   ??? Never Used       Family History:  Family History   Problem Relation Age of Onset   ??? Heart Disease Mother    ??? Lung Cancer Father    ??? Heart Attack Brother      Current Medications:  Current Outpatient Prescriptions on File Prior to Visit   Medication Sig Dispense Refill   ??? ondansetron (ZOFRAN) 8 MG tablet Take 1 tablet by mouth every 8 hours as needed for Nausea or Vomiting 30 tablet  3   ??? zolpidem (AMBIEN) 5 MG tablet Take 5 mg by mouth nightly as needed for Sleep     ??? oxyCODONE-acetaminophen (PERCOCET) 7.5-325 MG per tablet Take 1 tablet by mouth every 4 hours as needed for Pain . 90 tablet 0   ??? prochlorperazine (COMPAZINE) 10 MG tablet Take 1 tablet by mouth every 6 hours as needed (nausea) 120 tablet 3   ??? polyethylene glycol (GLYCOLAX) packet Take 17 g by mouth daily as needed for Constipation     ??? omeprazole (PRILOSEC) 20 MG delayed release capsule Take 20 mg by mouth daily     ??? ondansetron (ZOFRAN) 8 MG tablet Take one tablet po daily one hour prior to radiation 30 tablet 1   ??? aspirin 81 MG EC tablet Take 81 mg by mouth daily     ??? lisinopril (PRINIVIL;ZESTRIL) 10 MG tablet Take 10 mg by mouth daily     ??? atorvastatin (LIPITOR) 10 MG tablet Take 10 mg by mouth daily     ??? eszopiclone (LUNESTA) 2 MG TABS Take 1 tablet by mouth nightly 30 tablet 3   ??? lenalidomide (REVLIMID) 25 MG chemo  capsule TAKE 1 CAPSULE BY MOUTH DAILY FOR 2 WEEKS ON THEN 1 WEEK OFF 14 capsule 0   ??? dexamethasone (DECADRON) 4 MG tablet Take one tablet po BID beginning 03-22-16 as directed. (Patient taking differently: 2 mg 2 times daily Take one tablet po BID beginning 03-22-16 as directed.) 40 tablet 0     No current facility-administered medications on file prior to visit.        Allergies:  Allergies   Allergen Reactions   ??? Pomalidomide Hives   ??? Ceclor [Cefaclor] Hives   ??? Lenalidomide Rash       REVIEW OF SYSTEMS:    CONSTITUTIONAL: Negative for fevers and chills  HEENT: Negative for oropharyngeal exudate, post nasal drip, sinus pain / pressure, nasal congestion, ear pain  RESPIRATORY:  See HPI  CARDIOVASCULAR: Negative for chest pain, palpitations, edema  GASTROINTESTINAL: Negative for nausea, vomiting, diarrhea, constipation and abdominal pain  HEMATOLOGICAL: Negative for adenopathy  SKIN: Negative for clubbing, cyanosis, skin lesions  EXTREMITIES: Negative for weakness, decreased ROM  NEUROLOGICAL: Negative  for unilateral weakness, speech or gait abnormalities.Does complain of episodes of numbness and pain in his right leg which last for a couple hours at a time.  PSYCH: Negative for anxiety, depression    Objective:   PHYSICAL EXAM:        VITALS:  BP 108/64 (Site: Left Arm, Position: Sitting, Cuff Size: Large Adult)    Pulse 77    Resp 16    Ht 5' 9"  (1.753 m)    Wt 202 lb 3.2 oz (91.7 kg)    SpO2 94%    BMI 29.86 kg/m??     CONSTITUTIONAL:  Awake, alert, cooperative, no apparent distress, and appears stated age  HEENT: No oropharyngeal exudate, PERRL, no cervical adenopathy, no tracheal deviation, thyroid size normal  LUNGS:  No increased work of breathing and clear to auscultation, no crackles or wheezing   CARDIOVASCULAR:  normal S1 and S2 and no JVD  ABDOMEN:  Normal bowel sounds, non-distended and non-tender to palpation  EXT: No edema, no calf tenderness. Pulses are present bilaterally.  NEUROLOGIC:  Mental Status Exam:  Level of Alertness:   awake  Orientation:   person, place, time.  SKIN:  normal skin color, texture, turgor, no redness, warmth, or swelling     DATA:      Radiology Review:  Pertinent images / reports were reviewed as a part of this visit. cxr reveals the following:  Cardiac size and mediastinal contour normal with midline trachea.   Lungs hyperinflated with diaphragmatic flattening consistent with   COPD. No consolidation or pleural effusion observed. Right rib   fractures are identified, chronic. There is also a small   extrapleural mass in profile with the left lower chest   peripherally, in conjunction with lucency of an adjacent rib.   Underlying fracture is not excluded.   ??   There is also mild deformity of the left anterior third rib as   well       Last PFTs:  07/2016  IMPRESSION:  1.  Severe obstructive defect.  2.  There is a significant response to bronchodilators in small and large  airways.  3.  I am unable to assess for a restrictive defect.  4.  Normal diffusion  capacity.    ECHO:  11/17  Summary  ??Left ventricle size is normal. Normal left ventricular wall thickness. Left  ??ventricular function is normal with ejection fraction estimated at 55-60%.  ??Abnormal (paradoxical)  septal motion is present.  ??Mitral annular calcification is present.  ??Individual aortic valve leaflets are not well seen. The valve is sclerotic  ??but opens adequately.  ??There is mild tricuspid regurgitation with RVSP estimated at 33 mmHg.      Assessment:   This is a 60 y.o. 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  -It's most likely that his dyspnea with exertion is from deconditioning and less so pulmonary disease however given the bronchodilator reversibility on his PFTs and the appearance of chronic bronchitis/COPD with his flow-volume loops I think he would be best served going into this transplant on a maintenance inhaler.  I chose to avoid inhaled steroids for the increased risk of pneumonia and instead went with a LABA/LAMA combo.  It appears anoro is covered by his insurance.  I also wrote for rescue inhaler.  From my standpoint there isn't modifiable risk going into a stem cell transplant.  Functionally he appears that he is doing reasonably well all things considered it is appropriate for an autotransplant.  I would like to reevaluate his dyspnea after he's completed the transplant process and has had some time to recondition himself.  If at that time he is not short of breath with exertion but I will consider stopping the anoro.  -   - Tobacco use:   The patient is not currently smoking.      More than half the time of this 45 minute visit was spent counseling the patient.    - RTC 5 months w/ MD. Call or RTC sooner if symptoms persist or worsen acutely.

## 2016-08-07 NOTE — Progress Notes (Signed)
[facility] [address] [place], [place] [address]    Acknowledgment of Informed Consent for Surgical or Medical Procedure and Sedation  I agree to allow doctor(s) CORY BARRAT and his/her associates or assistants, including residents and/or other qualified medical practitioner to perform the following medical treatment or procedure and to administer or direct the administration of sedation as necessary:  Procedure(s): TRIFUSION CATHETER PLACEMENT  My doctor has explained the following regarding the proposed procedure:  ??? the explanation of the procedure  ??? the benefits of the procedure  ??? the potential problems that might occur during recuperation  ??? the risks and side effects of the procedure which could include but are not limited to severe blood loss, infection, stroke or death  ??? the benefits, risks and side effect of alternative procedures including the consequences of declining this procedure or any alternative procedures  ??? the likelihood of achieving satisfactory results.  I acknowledge no guarantee or assurance has been made to me regarding the results.    I understand that during the course of this treatment/procedure, unforeseen conditions can occur which require an additional or different procedure.  I agree to allow my physician or assistants to perform such extension of the original procedure as they may find necessary.    I understand that sedation will often result in temporary impairment of memory and fine motor skills and that sedation can occasionally progress to a state of deep sedation or general anesthesia.    I understand the risks of anesthesia for surgery include, but are not limited to, sore throat, hoarseness, injury to face, mouth, or teeth; nausea; headache; injury to blood vessels or nerves; death, brain damage, or paralysis.    I understand that if I have a Limitation of Treatment order in effect during my hospitalization, the order may or may not be in  effect during this procedure.     I give my doctor permission to give me blood or blood products.  I understand that there are risks with receiving blood such as hepatitis, AIDS, fever, or allergic reaction.  I acknowledge that the risks, benefits, and alternatives of this treatment have been explained to me and that no express or implied warranty has been given by the hospital, any blood bank, or any person or entity as to the blood or blood components transfused.    At the discretion of my doctor, I agree to allow observers, equipment/product representatives and allow photographing, and/or televising of the procedure, provided my name or identity is maintained confidentially.      I agree the hospital may dispose of or use for scientific or educational purposes any tissue, fluid, or body parts which may be removed.    ________________________________Date________Time______ am/pm  (Circle One)  Patient or Signature of Closest Relative or Legal Guardian    ________________________________Date________Time______am/pm      Page 1 of  1  Witness

## 2016-08-07 NOTE — Progress Notes (Addendum)
PRE-OP INSTRUCTIONS FOR THE SURGICAL PATIENT YOU ARE UNABLE TO MAKE CONTACT FOR AN INTERVIEW:      1. Follow instructions for your ARRIVAL TIME as DIRECTED BY YOUR SURGEON.   2. Enter the MAIN entrance located on [address] and report to the desk.   3. Bring your insurance & prescription card and photo ID with you. You may also be asked to pay a co-pay, as you may want to bring a check or credit card with you.   4. Leave all other valuables at home.               5. Arrange for someone to drive you home and be with you for the first 24 hours after discharge.  6. You must contact your surgeon for ALL medication instructions, especially if taking blood thinners, aspirin, or diabetic medication.  7. A Pre-op History and Physical for surgery MUST be completed by your Physician or an Urgent Care within 30 days of your procedure date.  Please bring a copy with you on the day of your procedure and along with any other testing performed.  8. DO NOT EAT OR DRINK ANYTHING AFTER MIDNIGHT, including gum, candy, mints or ice chips   9. Dress in loose, comfortable clothing appropriate for redressing after your procedure. Do not wear jewelry (including body piercings), make-up, fingernail polish, lotion, powders or metal hairclips. Contacts will need to be removed prior to surgery.  10. If you use a CPAP, please bring it with you on the day of your procedure.  11. Do not shave or wax for 72 hours prior to procedure near your operative site  12. FOR WOMAN OF CHILDBEARING AGE ONLY- please bring a urine sample with you on day of surgery or make sure we can collect on arrival.    If you have further questions, you may contact us at [phone]  Have H&P, EKG and labs    Left instructions on patient's voicemail.    David Watkins.08/07/2016 .1:52 PM

## 2016-08-10 ENCOUNTER — Ambulatory Visit: Admit: 2016-08-10 | Primary: Hematology & Oncology

## 2016-08-10 ENCOUNTER — Encounter: Admit: 2016-08-10 | Primary: Hematology & Oncology

## 2016-08-10 ENCOUNTER — Inpatient Hospital Stay: Admit: 2016-08-07 | Attending: Hematology & Oncology | Primary: Hematology & Oncology

## 2016-08-10 ENCOUNTER — Encounter

## 2016-08-10 ENCOUNTER — Inpatient Hospital Stay: Attending: Surgery | Primary: Hematology & Oncology

## 2016-08-10 DIAGNOSIS — C9000 Multiple myeloma not having achieved remission: Secondary | ICD-10-CM

## 2016-08-10 MED ORDER — LACTATED RINGERS IV SOLN
INTRAVENOUS | Status: DC
Start: 2016-08-10 — End: 2016-08-12
  Administered 2016-08-10: 18:00:00 via INTRAVENOUS

## 2016-08-10 MED ORDER — FENTANYL CITRATE (PF) 100 MCG/2ML IJ SOLN
100 MCG/2ML | Freq: Once | INTRAMUSCULAR | Status: AC
Start: 2016-08-10 — End: 2016-08-10

## 2016-08-10 MED ORDER — FENTANYL CITRATE (PF) 100 MCG/2ML IJ SOLN
100 MCG/2ML | INTRAMUSCULAR | Status: AC
Start: 2016-08-10 — End: 2016-08-10
  Administered 2016-08-10: 19:00:00 100 via INTRAVENOUS

## 2016-08-10 MED ORDER — MEPERIDINE HCL 25 MG/ML IJ SOLN
25 | INTRAMUSCULAR | Status: DC | PRN
Start: 2016-08-10 — End: 2016-08-12

## 2016-08-10 MED ORDER — FENTANYL CITRATE (PF) 100 MCG/2ML IJ SOLN
100 MCG/2ML | INTRAMUSCULAR | Status: DC
Start: 2016-08-10 — End: 2016-08-10

## 2016-08-10 MED ORDER — HYDROMORPHONE HCL 1 MG/ML IJ SOLN
1 | INTRAMUSCULAR | Status: DC | PRN
Start: 2016-08-10 — End: 2016-08-12

## 2016-08-10 MED ORDER — LABETALOL HCL 5 MG/ML IV SOLN
5 | INTRAVENOUS | Status: DC | PRN
Start: 2016-08-10 — End: 2016-08-12

## 2016-08-10 MED ORDER — HYDROMORPHONE HCL 1 MG/ML IJ SOLN
1 MG/ML | INTRAMUSCULAR | Status: AC
Start: 2016-08-10 — End: 2016-08-11

## 2016-08-10 MED ORDER — HYDROMORPHONE HCL 1 MG/ML IJ SOLN
1 | INTRAMUSCULAR | Status: DC | PRN
Start: 2016-08-10 — End: 2016-08-12
  Administered 2016-08-10: 21:00:00 0.5 mg via INTRAVENOUS

## 2016-08-10 MED ORDER — FENTANYL CITRATE (PF) 100 MCG/2ML IJ SOLN
100 | INTRAMUSCULAR | Status: DC | PRN
Start: 2016-08-10 — End: 2016-08-12

## 2016-08-10 MED ORDER — ONDANSETRON HCL 4 MG/2ML IJ SOLN
4 | Freq: Once | INTRAMUSCULAR | Status: AC | PRN
Start: 2016-08-10 — End: 2016-08-10

## 2016-08-10 MED FILL — FENTANYL CITRATE (PF) 100 MCG/2ML IJ SOLN: 100 MCG/2ML | INTRAMUSCULAR | Qty: 2

## 2016-08-10 MED FILL — LACTATED RINGERS IV SOLN: INTRAVENOUS | Qty: 1000

## 2016-08-10 MED FILL — HYDROMORPHONE HCL 1 MG/ML IJ SOLN: 1 MG/ML | INTRAMUSCULAR | Qty: 1

## 2016-08-10 NOTE — Discharge Instructions (Signed)
[facility] AMBULATORY PROCEDURE DISCHARGE INSTRUCTIONS  You may be drowsy or lightheaded after receiving sedation or anesthesia. Do not operate any vehicles (automobiles, bicycles, motorcycles) or power tools or machinery for 24 hours.  Do not sign any legal documents or make any legal decisions for 24 hours. Do not drink alcohol for 24 hours or while taking narcotic pain medication.  A responsible person should be with you for the next 24 hours.    Please follow the instructions checked below:    DIET INSTRUCTIONS:  [x] Start with light diet and progress to your normal diet as you feel like eating. If you experience nausea or repeated episodes of vomiting which persist beyond 12-24 hours, notify your doctor.      ACTIVITY INSTRUCTIONS:  [x] Rest today. Increase activity as tolerated    [x] No heavy lifting or strenuous activity     [x] No driving for 24 hours or while taking narcotics       WOUND/DRESSING INSTRUCTIONS:       Always ensure you and your care giver clean hands before and after caring for the wound.  [] May shower      [] May bathe                                                        MEDICATION INSTRUCTIONS:  [] Prescription(s):     sent with you.  Use as directed.  When taking pain medications, you may experience the side effect of dizziness or drowsiness.  Do not drink alcohol or drive when taking these medications.  [] Prescription(s):          Called to Pharmacy Name and location:    [] You may take a non-prescription "headache remedy", preferably one that does not contain aspirin.    [x] Give the list of your medications to your primary care physician on your next visit. Keep your med list updated and carry it with in case of emergencies.    [] If you take any blood thinning medication, such as Coumadin or Plavix, check with your surgeon on when to re-start taking it.    [] Narcotic pain medications can cause the side effect of significant constipation.  You may want to add a stool softener to your  postoperative medication schedule or speak to your surgeon on how best to manage this side effect.    If you have a CPAP machine, it is very important that you use it daily during all periods of sleep and daytime rest during your recovery at home.  Surgery and Anesthesia place a significant amount of stress on your body.  Using your CPAP will help keep you safe and lessen the negative effects of that stress.    FOLLOW-UP CARE:  Call Dr. Trula Ore office for follow up questions/concerns.    Watch for the following significant complications or symptoms.  Call physician if they or any other problems occur.  If unable to reach your physician, please go to the emergency department.  - Fever over 101    - Redness, swelling, hardness or warmth at the operative site  - Foul smelling or cloudy drainage at the operative site  - Unrelieved nausea               - Unrelieved pain    - Blood soaked dressing. (Some oozing may be normal)  - Inability  to urinate      - Numb, pale, blue, cold or tingling extremity        Physician:      The above instructions were reviewed with patient/significant other.  Copy given to patient/significant other.  The following additional patient specific information was reviewed with the patient/significant other:  [] Procedure/physician specific instructions  [] Medication information sheet(s) including indication for medication and possible side effects  [] Dionne's egress test  [] Pain Ball management  [] FAQ Catheter associated blood stream infections  [x] FAQ Surgical Site Infections    [] Other-         Date/time 08/10/2016 4:21 PM         PACU:  [place]:  [phone]      (M-F 8am - 8pm)                    (M-F 6am - 6pm)        If you smoke STOP. We care about your health!    FAQs  (frequently asked questions)  About "Surgical Site Infections"    What is a Surgical Site Infection (SSI)?   A surgical site infection is an infection that occurs after surgery in the part of  the body where the surgery took place.  Most patients who have surgery do not develop an infection.  However, infections develop in about 1 to 3 out of every 100 patients who have surgery.   Some common symptoms of a surgical site infection are:   Marland Kitchen Redness and pain around the area where you had surgery  . Drainage of cloudy fluid from your surgical wound   . Fever    Can SSIs be treated?   Yes. Most surgical site infections can be treated with antibiotics.  The antibiotic given to you depends on the bacteria (germs) causing the infection.  Sometimes patients with SSIs also need another surgery to treat the infection.      What are some of the things that hospitals are doing to prevent SSIs?  To prevent SSIs, doctors, nurses and other healthcare providers:   . Clean their hands and arms up to their elbows with an antiseptic agent just before the surgery.   . Clean their hands with soap and water or an alcohol-based hand rub before and after caring for each patient.   . May remove some of your hair immediately before your surgery using electric clippers if the hair is in the same area where the procedure will occur.  They should not shave you with a razor.   . Wear special hair covers, masks, gowns, and gloves during surgery to keep the surgery area clean.  . Give you antibiotics before your surgery starts.  In most cases, you should get antibiotics within 60 minutes before the surgery starts and the antibiotics should be stopped within 24 hours after surgery.  . Clean the skin at the site of your surgery with a special soap that kills germs.     What can I do to help prevent SSIs?   Before you surgery:  . Tell your doctor about other medical problems you may have.  Health problems such as allergies, diabetes, and obesity could affect your surgery and your treatment.    . Quit smoking.  Patients who smoke get more infections.  Talk to your doctor about how you can quit before your surgery.  . Do not shave near where  you will have surgery.  Shaving with a razor can irritate your skin and make it easier to develop an infection.        At the time of your surgery:  . Speak up if someone tries to shave you with a razor before surgery.  Ask why you need to be shaved and talk with your surgeon if you have any concerns.   . Ask if you will get antibiotics before surgery.    After your surgery:  . Make sure that your healthcare providers clean their hands before examining you, either with soap and water or an alcohol-based hand rub.   . IF YOU DO NOT SEE YOUR PROVIDERS CLEAN THEIR HANDS, PLEASE ASK THEM TO DO SO.   . Family and friends who visit you should not touch the surgical wound or dressings.  . Family and friends should clean their hands with soap and water or an alcohol-based hand rub before and after visiting you.  If you do not see them clean their hands, ask them to clean their hands.     What do I need to do when I go home from the hospital?  . Before you go home, your doctor nurses should explain everything you need to know about taking care of your wound.  Make sure you understand how to care for your wound before you leave the hospital.    . Always clean your hands before and after caring for your wound.    . Before you go home, make sure you know who to contact if you have questions or problems after you get home.   . If you have any symptoms of an infection, such as redness and pain at the surgery site, drainage, or fever, call your doctor immediately.     If you have additional questions, please ask your doctor or nurse.     Thank you for allowing [place] to care for you.  We hope we have exceeded your expectations and provided a very good overall experience while taking care of you and your family.

## 2016-08-10 NOTE — Anesthesia Pre-Procedure Evaluation (Signed)
ANESTHESIA PRE-OP NOTE    NAME: David Watkins  DOB: July 18, 1956  AGE: 60 y.o.  MED. REC. #: [id number]    DOS: 12.11.17      PROCEDURE: Thoracic/Lumbar And Sacral Spine MRI W/W out Contrast     SURGEON: N/A    HPI: Pt. Is a 59 y.o. Male who c/o of new onset of Bilateral leg and hip pain. Pt. Has Multiple Myeloma and Sx. Are no longer relieved with conservative treatment. Pt. Is now here for further evaluation and treatment of new symptoms    ALLERGIES: Pomalidomide; Ceclor [cefaclor]; and Lenalidomide Height: 5' 9"  (175.3 cm) Weight: 202 lb (91.6 kg)  Vitals:    08/10/16 1251   BP: 125/77   Pulse: 80   Resp: 15   Temp: 97.3 ??F (36.3 ??C)   SpO2: 95%      MEDICATIONS:  Patient's Medications   New Prescriptions    No medications on file   Previous Medications    ALBUTEROL SULFATE HFA (PROAIR HFA) 108 (90 BASE) MCG/ACT INHALER    Inhale 2 puffs into the lungs every 6 hours as needed for Wheezing    ASPIRIN 81 MG EC TABLET    Take 81 mg by mouth daily    ATORVASTATIN (LIPITOR) 10 MG TABLET    Take 10 mg by mouth daily    DEXAMETHASONE (DECADRON) 4 MG TABLET    Take one tablet po BID beginning 03-22-16 as directed.    ESZOPICLONE (LUNESTA) 2 MG TABS    Take 1 tablet by mouth nightly    LENALIDOMIDE (REVLIMID) 25 MG CHEMO CAPSULE    TAKE 1 CAPSULE BY MOUTH DAILY FOR 2 WEEKS ON THEN 1 WEEK OFF    LISINOPRIL (PRINIVIL;ZESTRIL) 10 MG TABLET    Take 10 mg by mouth daily    OMEPRAZOLE (PRILOSEC) 20 MG DELAYED RELEASE CAPSULE    Take 20 mg by mouth daily    ONDANSETRON (ZOFRAN) 8 MG TABLET    Take one tablet po daily one hour prior to radiation    ONDANSETRON (ZOFRAN) 8 MG TABLET    Take 1 tablet by mouth every 8 hours as needed for Nausea or Vomiting    OXYCODONE-ACETAMINOPHEN (PERCOCET) 7.5-325 MG PER TABLET    Take 1 tablet by mouth every 4 hours as needed for Pain .    POLYETHYLENE GLYCOL (GLYCOLAX) PACKET    Take 17 g by mouth daily as needed for Constipation    PROCHLORPERAZINE (COMPAZINE) 10 MG TABLET    Take 1 tablet by  mouth every 6 hours as needed (nausea)    UMECLIDINIUM-VILANTEROL (ANORO ELLIPTA) 62.5-25 MCG/INH AEPB INHALER    Inhale 1 puff into the lungs daily    ZOLPIDEM (AMBIEN) 5 MG TABLET    Take 5 mg by mouth nightly as needed for Sleep   Modified Medications    No medications on file   Discontinued Medications    No medications on file      ASA STATUS: 3  NPO since: > 8 hrs   Patient identified/chart reviewed: yes  CV: + HTN + HLD no CAD   PULMONARY: no  Asthma + COPD   no OSA   ENDOCRINE: no DM     no Thyroid Disease   GI: no GERD   GU: no known renal disease   NEURO/PSYCH: no CVA   no Seizure Disorder   MUSCULOSKELETAL: +multiple myeloma   HEME/ONC: no DVT  no PE   no Anemia  OTHER:   EKG:   Echocardiogram:   COMMENTS:        PSH:  has a past surgical history that includes bone marrow biopsy.    Patient Active Problem List   Diagnosis   ??? Multiple myeloma ([place])   ??? COPD, severe ([place])     PMH:  Past Medical History:   Diagnosis Date   ??? Hyperlipidemia    ??? Hypertension    ??? Multiple myeloma (H[facility])       PERSONAL/FAMILY ANESTHESIA PROBLEMS: no     AIRWAY ASSESSMENT:  MALLAMPATI: I DENTITION: no chipped or loose teeth ROM: full     ANESTHETIC PLAN: GA with standard ASA monitors    If peri-operative block planned, describe:    CONSENT: Risks/benefits/options/questions discussed. Patient agrees:  yes

## 2016-08-10 NOTE — H&P (Signed)
Donald Siva    [id number]      Department of General Surgery    Surgical Services     Pre-operative History and Physical      INDICATION:   MULTIPLE MYELOMA //  C90.00//FAX/    PROCEDURE:  Thoracic/Lumbar And Sacral Spine MRI W/W out Contrast    CHIEF COMPLAINT:  Pt. Is a 60 y.o. Male who c/o of new onset of Bilateral leg and hip pain. Pt. Has Multiple Myeloma and Sx. Are no longer relieved with conservative treatment. Pt. Is now here for further evaluation and treatment of new symptoms.     Patient Active Problem List   Diagnosis   ??? Multiple myeloma ([place])   ??? COPD, severe ([place])       HISTORY: Please see below:    Weight loss:  No    Hx Steroid use: Yes    Past Medical History:        Diagnosis Date   ??? Hyperlipidemia    ??? Hypertension    ??? Multiple myeloma (HCC)      Past Surgical History:        Procedure Laterality Date   ??? BONE MARROW BIOPSY         Medications Prior to Admission:   Prior to Admission medications    Medication Sig Start Date End Date Taking? Authorizing Provider   umeclidinium-vilanterol (ANORO ELLIPTA) 62.5-25 MCG/INH AEPB inhaler Inhale 1 puff into the lungs daily 08/06/16   Neville Route, MD   albuterol sulfate HFA (PROAIR HFA) 108 (90 Base) MCG/ACT inhaler Inhale 2 puffs into the lungs every 6 hours as needed for Wheezing 08/06/16   Neville Route, MD   ondansetron [facility]) 8 MG tablet Take 1 tablet by mouth every 8 hours as needed for Nausea or Vomiting 04/24/16   Eulis Canner, MD   zolpidem (AMBIEN) 5 MG tablet Take 5 mg by mouth nightly as needed for Sleep    Historical Provider, MD   eszopiclone (LUNESTA) 2 MG TABS Take 1 tablet by mouth nightly 04/23/16   Alain Marion, MD   lenalidomide (REVLIMID) 25 MG chemo capsule TAKE 1 CAPSULE BY MOUTH DAILY FOR 2 WEEKS ON THEN 1 WEEK OFF 04/13/16   Eulis Canner, MD   oxyCODONE-acetaminophen (PERCOCET) 7.5-325 MG per tablet Take 1 tablet by mouth every 4 hours as needed for Pain . 04/06/16   Eulis Canner, MD   prochlorperazine (COMPAZINE) 10 MG tablet  Take 1 tablet by mouth every 6 hours as needed (nausea) 03/20/16   Amedeo Kinsman, NP   dexamethasone (DECADRON) 4 MG tablet Take one tablet po BID beginning 03-22-16 as directed.  Patient taking differently: 2 mg 2 times daily Take one tablet po BID beginning 03-22-16 as directed. 03/20/16   Arrie Senate, MD   polyethylene glycol [facility]) packet Take 17 g by mouth daily as needed for Constipation    Historical Provider, MD   omeprazole (PRILOSEC) 20 MG delayed release capsule Take 20 mg by mouth daily    Historical Provider, MD   ondansetron (ZOFRAN) 8 MG tablet Take one tablet po daily one hour prior to radiation 03/20/16   Arrie Senate, MD   aspirin 81 MG EC tablet Take 81 mg by mouth daily    Historical Provider, MD   lisinopril (PRINIVIL;ZESTRIL) 10 MG tablet Take 10 mg by mouth daily    Historical Provider, MD   atorvastatin (LIPITOR) 10 MG tablet Take 10 mg by mouth daily  Historical Provider, MD       Allergies:  Pomalidomide; Ceclor [cefaclor]; and Lenalidomide    History of allergic reaction to anesthesia:  No    Social History:   Social History     Social History   ??? Marital status: Married     Spouse name: N/A   ??? Number of children: N/A   ??? Years of education: N/A     Social History Main Topics   ??? Smoking status: Former Smoker     Quit date: 11/25/2012   ??? Smokeless tobacco: Never Used   ??? Alcohol use Not on file   ??? Drug use: Unknown   ??? Sexual activity: Not on file     Other Topics Concern   ??? Not on file     Social History Narrative   ??? No narrative on file         Family History:       Problem Relation Age of Onset   ??? Heart Disease Mother    ??? Lung Cancer Father    ??? Heart Attack Brother          REVIEW OF SYSTEMS:    CONSTITUTIONAL:no fevers, chills, sweats, fatigue and malaise  RESPIRATORY:  no respiratory symptoms  CARDIOVASCULAR:no chest pain, dyspnea, palpitations, orthopnea, PND, exertional chest pressure/discomfort  GASTROINTESTINAL:no  nausea, vomiting, change in bowel habits,  diarrhea, constipation, abdominal pain and pruritus  GENITOURINARY:  negative  INTEGUMENT/BREAST:  negative  MUSCULOSKELETAL: + myalgias, arthralgias, pain, decreased range of motion and bone pain- generalized  NEUROLOGICAL:  alert, oriented, normal speech, no focal findings or movement disorder noted    PHYSICAL EXAM:      BP 125/77    Pulse 80    Temp 97.3 ??F (36.3 ??C) (Oral)    Resp 15    Ht 5' 9"  (1.753 m)    Wt 202 lb (91.6 kg)    SpO2 95%    BMI 29.83 kg/m??  I      Eyes:  pupils equal, round and reactive to light and conjunctiva normal    Head/ENT:  Normocephalic, without obvious abnormality, atramatic, oral pharynx with moist mucus membranes, tonsils without erythema or exudates, gums normal and good dentition.    Neck:  Full ROM, supple, symmetrical, trachea midline, no adenopathy, thyroid symmetric, not enlarged and no tenderness, skin warm and dry.    Heart: regular rate and rhythm,no murmur     Lungs:  No increased work of breathing, good air exchange, clear to auscultation bilaterally, no crackles or wheezing    Abdomen:  Normal bowel sounds, soft, non-distended, non-tender, no masses palpated    Extremities:  No clubbing, cyanosis, or edema      ASSESSMENT AND PLAN:    1.  Patient is a 60 y.o. male with above specified procedure planned.         As of note, patient drank about 8 oz. H2O this AM and is requesting pain medication ASAP. Informed patient I would let his nurse know right away and find out if drinking the water would delay or cancel MRI. NO RN assigned so I Environmental health practitioner Vickii Chafe. She stated that she would notify anesthesia for both issues.    2.  Access to ancillary services are available per request of the provider.    Janey Genta   08/10/2016

## 2016-08-10 NOTE — Progress Notes (Signed)
PACU Transfer to SDS    Vitals:    08/10/16 1630   BP: 112/66   Pulse: 68   Resp: 11   Temp: 97.6 ??F (36.4 ??C)   SpO2: 97%         Intake/Output Summary (Last 24 hours) at 08/10/16 1649  Last data filed at 08/10/16 1558   Gross per 24 hour   Intake              900 ml   Output                0 ml   Net              900 ml     Pt back pain 8/10 preop.  Pt states pain tolerable level.  Pain assessment: 0-10  Pain Level: 6    Patient transferred to care of SDS RN.    08/10/2016 4:49 PM

## 2016-08-10 NOTE — Progress Notes (Signed)
[facility] PACU Education and Care Plan Goals  The following items will be achieved upon completion of the patient's transfer or discharge from the PACU:    Post Operative Pain Management                                                                               [x] Patient will verbalize understanding of pain scale and pain management.  [x] Patient achieves predetermined pain goal of 4  [x] Self reports a comfort level acceptable for discharge  [] Other     Fall Risk Potential  [x] Due to Perioperative medication administration  Additional Risk Identified:   [] Sensory deficit         [] Motor deficit         [] Balance problem         [] Home medication         [] Uses assistive device to ambulate    Goal(s) for fall prevention:  [x] Prevent fall or injury by calling for assistance with activity and use of siderails while hospitalized  [x] Prevent fall or injury by using assistance with activity after discharge.  [x] Patient / Significant other verbalize understanding in use of any ordered assistive devices    Mobility Safety/ ADL  [x] Reach a functional mobility goal within limitations of the procedure.    Infection Precautions                                                                                                            [x]Patient understands implementation of infection precautions (see [facility] Presurgical Instructions and SSI Prevention Handout)    Post operative Assessment and Care                                                             [x] Standards of care met as delineated by [facility]AN.                                                              Discharge Education and Goals  [x]Patient voices understanding of PACU discharge criteria  [x]Outpatient / significant other voices understanding of home care and follow up procedures (See [facility] Procedure Discharge Instructions)  [] Patient / significant other understanding of Special Needs:  [] Cooling device  []Wound Support  Device  []   Crutches   []Drain    [] Walker   []Other  [] Inpatient / significant other understands the plan for transfer to the inpatient unit

## 2016-08-10 NOTE — Anesthesia Post-Procedure Evaluation (Signed)
Anesthesia Post-op Note    Patient: David Watkins  MRN: [id number]  Birthdate: Jan 18, 1956  Date of evaluation: 08/10/2016  Time:  4:26 PM     Procedure(s) Performed:     Last Vitals: BP 122/76    Pulse 78    Temp 97.8 ??F (36.6 ??C) (Temporal)    Resp 12    Ht 5\' 9"  (1.753 m)    Wt 202 lb (91.6 kg)    SpO2 95%    BMI 29.83 kg/m??     Aldrete Phase I: Aldrete Score: 9    Aldrete Phase II:      Anesthesia Post Evaluation    Final anesthesia type: general  Patient location during evaluation: PACU  Patient participation: complete - patient participated  Level of consciousness: awake and alert  Pain score: 0  Airway patency: patent  Nausea & Vomiting: no nausea and no vomiting  Complications: no  Cardiovascular status: blood pressure returned to baseline  Respiratory status: acceptable  Hydration status: euvolemic        Lonni Fix, MD  4:26 PM

## 2016-08-10 NOTE — Progress Notes (Signed)
Ambulatory Surgery/Procedure Discharge Note    Vitals:    08/10/16 1646   BP: 124/70   Pulse: 69   Resp: 16   Temp: 97.6 ??F (36.4 ??C)   SpO2: 95%       In: 900 [I.V.:900]  Out: -     Pain assessment:  present - adequately treated  Pain Level: 5    Patient discharged to home/self care. Patient discharged via wheel chair by transporter to waiting family/S.O.       08/10/2016 5:36 PM

## 2016-08-10 NOTE — Progress Notes (Signed)
Pt to pacu bay #16, report from anesthesia.  Pt denies pain on pacu arrival.

## 2016-08-11 MED FILL — LIDOCAINE HCL (CARDIAC) 20 MG/ML IV SOLN: 20 MG/ML | INTRAVENOUS | Qty: 5

## 2016-08-11 MED FILL — PHENYLEPHRINE HCL 10 MG/ML IJ SOLN: 10 MG/ML | INTRAMUSCULAR | Qty: 1

## 2016-08-11 MED FILL — STERILE WATER FOR INJECTION IJ SOLN: INTRAMUSCULAR | Qty: 10

## 2016-08-11 MED FILL — ONDANSETRON HCL 4 MG/2ML IJ SOLN: 4 MG/2ML | INTRAMUSCULAR | Qty: 2

## 2016-08-11 MED FILL — METOCLOPRAMIDE HCL 5 MG/ML IJ SOLN: 5 MG/ML | INTRAMUSCULAR | Qty: 2

## 2016-08-11 MED FILL — PROPOFOL 200 MG/20ML IV EMUL: 200 MG/20ML | INTRAVENOUS | Qty: 40

## 2016-08-14 NOTE — Progress Notes (Signed)
PRE-OP INSTRUCTIONS FOR THE SURGICAL PATIENT YOU ARE UNABLE TO MAKE CONTACT FOR AN INTERVIEW:      1. Follow instructions for your ARRIVAL TIME as DIRECTED BY YOUR SURGEON.   2. Enter the MAIN entrance located on [address] and report to the desk.   3. Bring your insurance & prescription card and photo ID with you. You may also be asked to pay a co-pay, as you may want to bring a check or credit card with you.   4. Leave all other valuables at home.               5. Arrange for someone to drive you home and be with you for the first 24 hours after discharge.  6. You must contact your surgeon for ALL medication instructions, especially if taking blood thinners, aspirin, or diabetic medication.  7. A Pre-op History and Physical for surgery MUST be completed by your Physician or an Urgent Care within 30 days of your procedure date.  Please bring a copy with you on the day of your procedure and along with any other testing performed.  8. DO NOT EAT OR DRINK ANYTHING AFTER MIDNIGHT, including gum, candy, mints or ice chips   9. Dress in loose, comfortable clothing appropriate for redressing after your procedure. Do not wear jewelry (including body piercings), make-up, fingernail polish, lotion, powders or metal hairclips. Contacts will need to be removed prior to surgery.  10. If you use a CPAP, please bring it with you on the day of your procedure.  11. Do not shave or wax for 72 hours prior to procedure near your operative site  12. FOR WOMAN OF CHILDBEARING AGE ONLY- please bring a urine sample with you on day of surgery or make sure we can collect on arrival.    If you have further questions, you may contact us at [phone]    Left instructions on patient's voicemail.    Abbe Amsterdam.08/14/2016 .11:46 AM

## 2016-08-16 NOTE — Anesthesia Pre-Procedure Evaluation (Signed)
ANESTHESIA PRE-OP NOTE    NAME: David Watkins  DOB: 02-Dec-1955  AGE: 60 y.o.  MED. REC. #: [id number]    DOS: 12.11.17      PROCEDURE: TRIFUSION CATHETER PLACEMENT     SURGEON: Barrat    HPI: 60 yo M with h/o multiple myeloma    ALLERGIES: Pomalidomide; Ceclor [cefaclor]; and Lenalidomide      There were no vitals filed for this visit.   MEDICATIONS:  Patient's Medications   New Prescriptions    No medications on file   Previous Medications    ALBUTEROL SULFATE HFA (PROAIR HFA) 108 (90 BASE) MCG/ACT INHALER    Inhale 2 puffs into the lungs every 6 hours as needed for Wheezing    ASPIRIN 81 MG EC TABLET    Take 81 mg by mouth daily    ATORVASTATIN (LIPITOR) 10 MG TABLET    Take 10 mg by mouth daily    DEXAMETHASONE (DECADRON) 4 MG TABLET    Take one tablet po BID beginning 03-22-16 as directed.    ESZOPICLONE (LUNESTA) 2 MG TABS    Take 1 tablet by mouth nightly    LENALIDOMIDE (REVLIMID) 25 MG CHEMO CAPSULE    TAKE 1 CAPSULE BY MOUTH DAILY FOR 2 WEEKS ON THEN 1 WEEK OFF    LISINOPRIL (PRINIVIL;ZESTRIL) 10 MG TABLET    Take 10 mg by mouth daily    OMEPRAZOLE (PRILOSEC) 20 MG DELAYED RELEASE CAPSULE    Take 20 mg by mouth daily    ONDANSETRON (ZOFRAN) 8 MG TABLET    Take one tablet po daily one hour prior to radiation    ONDANSETRON (ZOFRAN) 8 MG TABLET    Take 1 tablet by mouth every 8 hours as needed for Nausea or Vomiting    OXYCODONE-ACETAMINOPHEN (PERCOCET) 7.5-325 MG PER TABLET    Take 1 tablet by mouth every 4 hours as needed for Pain .    POLYETHYLENE GLYCOL (GLYCOLAX) PACKET    Take 17 g by mouth daily as needed for Constipation    PROCHLORPERAZINE (COMPAZINE) 10 MG TABLET    Take 1 tablet by mouth every 6 hours as needed (nausea)    UMECLIDINIUM-VILANTEROL (ANORO ELLIPTA) 62.5-25 MCG/INH AEPB INHALER    Inhale 1 puff into the lungs daily    ZOLPIDEM (AMBIEN) 5 MG TABLET    Take 5 mg by mouth nightly as needed for Sleep   Modified Medications    No medications on file   Discontinued Medications    No  medications on file      ASA STATUS: 3  NPO since: > 8 hrs   Patient identified/chart reviewed: yes  CV: + HTN + HLD no CAD   PULMONARY: no  Asthma + COPD (well controlled) no OSA   ENDOCRINE: no DM  no Thyroid Disease   GI: + GERD   GU: no known renal disease   NEURO/PSYCH: no CVA   no Seizure Disorder   MUSCULOSKELETAL: +multiple myeloma   HEME/ONC: no DVT  no PE   no Anemia      OTHER:   EKG:   Echocardiogram:   COMMENTS:        PSH:  has a past surgical history that includes bone marrow biopsy.    Patient Active Problem List   Diagnosis   ??? Multiple myeloma ([place])   ??? COPD, severe ([place])     PMH:  Past Medical History:   Diagnosis Date   ??? Hyperlipidemia    ???  Hypertension    ??? Multiple myeloma (HCC)       PERSONAL/FAMILY ANESTHESIA PROBLEMS: no     AIRWAY ASSESSMENT:  MALLAMPATI: I DENTITION: no chipped or loose teeth ROM: full     ANESTHETIC PLAN: TIVA    If peri-operative block planned, describe:    CONSENT: Risks/benefits/options/questions discussed. Patient agrees:  yes

## 2016-08-17 ENCOUNTER — Encounter: Primary: Hematology & Oncology

## 2016-08-17 ENCOUNTER — Encounter

## 2016-08-17 ENCOUNTER — Ambulatory Visit: Admit: 2016-08-17 | Primary: Hematology & Oncology

## 2016-08-17 ENCOUNTER — Inpatient Hospital Stay
Admit: 2016-08-17 | Discharge: 2017-08-05 | Payer: BLUE CROSS/BLUE SHIELD | Attending: Surgery | Primary: Hematology & Oncology

## 2016-08-17 DIAGNOSIS — C9000 Multiple myeloma not having achieved remission: Secondary | ICD-10-CM

## 2016-08-17 MED ORDER — PROPOFOL 200 MG/20ML IV EMUL
200 | INTRAVENOUS | Status: AC
Start: 2016-08-17 — End: 2016-08-17

## 2016-08-17 MED ORDER — HYDROMORPHONE HCL 1 MG/ML IJ SOLN
1 | INTRAMUSCULAR | Status: DC | PRN
Start: 2016-08-17 — End: 2016-08-18

## 2016-08-17 MED ORDER — FENTANYL CITRATE (PF) 100 MCG/2ML IJ SOLN
100 | INTRAMUSCULAR | Status: DC | PRN
Start: 2016-08-17 — End: 2016-08-18

## 2016-08-17 MED ORDER — MIDAZOLAM HCL 2 MG/2ML IJ SOLN
2 MG/ML | Freq: Once | INTRAMUSCULAR | Status: DC
Start: 2016-08-17 — End: 2016-08-18

## 2016-08-17 MED ORDER — HYDRALAZINE HCL 20 MG/ML IJ SOLN
20 | INTRAMUSCULAR | Status: DC | PRN
Start: 2016-08-17 — End: 2016-08-18

## 2016-08-17 MED ORDER — ONDANSETRON HCL 4 MG/2ML IJ SOLN
4 | Freq: Once | INTRAMUSCULAR | Status: AC | PRN
Start: 2016-08-17 — End: 2016-08-17

## 2016-08-17 MED ORDER — HEPARIN SOD (PORK) LOCK FLUSH 100 UNIT/ML IV SOLN
100 | INTRAVENOUS | Status: AC
Start: 2016-08-17 — End: 2016-08-17

## 2016-08-17 MED ORDER — MIDAZOLAM HCL 2 MG/2ML IJ SOLN
2 | INTRAMUSCULAR | Status: AC | PRN
Start: 2016-08-17 — End: 2016-08-17

## 2016-08-17 MED ORDER — BUPIVACAINE-EPINEPHRINE (PF) 0.5% -1:200000 IJ SOLN
INTRAMUSCULAR | Status: AC
Start: 2016-08-17 — End: 2016-08-17

## 2016-08-17 MED ORDER — SUCCINYLCHOLINE CHLORIDE 20 MG/ML IJ SOLN
20 | INTRAMUSCULAR | Status: AC
Start: 2016-08-17 — End: 2016-08-17

## 2016-08-17 MED ORDER — LIDOCAINE HCL (CARDIAC) 20 MG/ML IV SOLN
20 | INTRAVENOUS | Status: AC
Start: 2016-08-17 — End: 2016-08-17

## 2016-08-17 MED ORDER — FENTANYL CITRATE (PF) 100 MCG/2ML IJ SOLN
100 MCG/2ML | INTRAMUSCULAR | Status: AC
Start: 2016-08-17 — End: 2016-08-17
  Administered 2016-08-17: 12:00:00 100

## 2016-08-17 MED ORDER — SODIUM CHLORIDE 0.9 % IJ SOLN
0.9 | INTRAMUSCULAR | Status: AC
Start: 2016-08-17 — End: 2016-08-17

## 2016-08-17 MED ORDER — FENTANYL CITRATE (PF) 100 MCG/2ML IJ SOLN
100 MCG/2ML | Freq: Once | INTRAMUSCULAR | Status: DC
Start: 2016-08-17 — End: 2016-08-18

## 2016-08-17 MED ORDER — FENTANYL CITRATE (PF) 100 MCG/2ML IJ SOLN
100 | INTRAMUSCULAR | Status: AC | PRN
Start: 2016-08-17 — End: 2016-08-17

## 2016-08-17 MED ORDER — LIDOCAINE HCL (PF) 1 % IJ SOLN
1 | INTRAMUSCULAR | Status: AC
Start: 2016-08-17 — End: 2016-08-17

## 2016-08-17 MED ORDER — LACTATED RINGERS IV SOLN
INTRAVENOUS | Status: DC
Start: 2016-08-17 — End: 2016-08-18

## 2016-08-17 MED ORDER — FENTANYL CITRATE (PF) 100 MCG/2ML IJ SOLN
100 | INTRAMUSCULAR | Status: DC | PRN
Start: 2016-08-17 — End: 2016-08-18
  Administered 2016-08-17 (×2): 50 ug via INTRAVENOUS

## 2016-08-17 MED ORDER — LABETALOL HCL 5 MG/ML IV SOLN
5 | INTRAVENOUS | Status: DC | PRN
Start: 2016-08-17 — End: 2016-08-18

## 2016-08-17 MED ORDER — PROMETHAZINE HCL 25 MG/ML IJ SOLN
25 | Freq: Once | INTRAMUSCULAR | Status: AC | PRN
Start: 2016-08-17 — End: 2016-08-17

## 2016-08-17 MED ORDER — GLYCOPYRROLATE 0.4 MG/2ML IJ SOLN
0.4 | INTRAMUSCULAR | Status: AC
Start: 2016-08-17 — End: 2016-08-17

## 2016-08-17 MED ORDER — KETAMINE HCL 50 MG/ML IJ SOLN
50 | INTRAMUSCULAR | Status: AC
Start: 2016-08-17 — End: 2016-08-17

## 2016-08-17 MED ADMIN — lactated ringers infusion: INTRAVENOUS | @ 12:00:00 | NDC 00338011704

## 2016-08-17 MED FILL — PROPOFOL 200 MG/20ML IV EMUL: 200 MG/20ML | INTRAVENOUS | Qty: 40

## 2016-08-17 MED FILL — FENTANYL CITRATE (PF) 100 MCG/2ML IJ SOLN: 100 MCG/2ML | INTRAMUSCULAR | Qty: 2

## 2016-08-17 MED FILL — MIDAZOLAM HCL 2 MG/2ML IJ SOLN: 2 MG/ML | INTRAMUSCULAR | Qty: 4

## 2016-08-17 MED FILL — HEPARIN LOCK FLUSH 100 UNIT/ML IV SOLN: 100 UNIT/ML | INTRAVENOUS | Qty: 30

## 2016-08-17 MED FILL — SENSORCAINE-MPF/EPINEPHRINE 0.5% -1:200000 IJ SOLN: INTRAMUSCULAR | Qty: 30

## 2016-08-17 MED FILL — LIDOCAINE HCL (PF) 1 % IJ SOLN: 1 % | INTRAMUSCULAR | Qty: 30

## 2016-08-17 MED FILL — HEPARIN LOCK FLUSH 100 UNIT/ML IV SOLN: 100 UNIT/ML | INTRAVENOUS | Qty: 5

## 2016-08-17 MED FILL — MIDAZOLAM HCL 2 MG/2ML IJ SOLN: 2 MG/ML | INTRAMUSCULAR | Qty: 2

## 2016-08-17 MED FILL — VANCOMYCIN HCL 1000 MG IV SOLR: 1000 MG | INTRAVENOUS | Qty: 1250

## 2016-08-17 MED FILL — SODIUM CHLORIDE 0.9 % IJ SOLN: 0.9 % | INTRAMUSCULAR | Qty: 10

## 2016-08-17 MED FILL — KETAMINE HCL 50 MG/ML IJ SOLN: 50 MG/ML | INTRAMUSCULAR | Qty: 10

## 2016-08-17 MED FILL — ANECTINE 20 MG/ML IJ SOLN: 20 MG/ML | INTRAMUSCULAR | Qty: 10

## 2016-08-17 MED FILL — LIDOCAINE HCL (CARDIAC) 20 MG/ML IV SOLN: 20 MG/ML | INTRAVENOUS | Qty: 5

## 2016-08-17 MED FILL — GLYCOPYRROLATE 0.4 MG/2ML IJ SOLN: 0.4 MG/2ML | INTRAMUSCULAR | Qty: 2

## 2016-08-17 NOTE — Progress Notes (Signed)
Admitted to [email or web address]. Report given by CRNA

## 2016-08-17 NOTE — Progress Notes (Signed)
Ambulatory Surgery/Procedure Discharge Note    Vitals:    08/17/16 1030   BP: 94/60   Pulse: 75   Resp: 14   Temp:    SpO2: 96%       In: 600 [I.V.:600]  Out: -     Pain assessment:  level of pain (1-10, 10 severe), 3  Pain Level: 4    Patient discharged to [facility] department for injection today. Patient discharged via wheel chair by transporter to waiting family/S.O.       08/17/2016 10:31 AM

## 2016-08-17 NOTE — Op Note (Addendum)
OPERATIVE NOTE     Patient name: David Watkins  DOB: [birth year]  MRN: [id number]  ??  Pre-operative Diagnosis: Multiple myeloma  ??  Post-operative Diagnosis: same  ??  Procedure: Left subclavian triple lumen Trifusion catheter (27 cm) insertion with fluoroscopic guidance  ??  Surgeon: Leavy Cella, MD    Anesthesia: MAC and Local  ??  Estimated Blood Loss: minimal  ??  Complications: None    Indication for procedure: Patient required Trifusion catheter placement as recommended by patient's oncologist.    Risks, benefits, and alternatives discussed with patient and any available family members in the preoperative area. Risks including but not limited to: bleeding, infection, hematoma, malposition, need for re-operation or removal, and pneumothorax. All questions answered and patient consented to proceed.  ??  Procedure Details:   The patient was brought to the operating room and placed in the supine position with arms padded and tucked at sides. A towel roll was placed between the scapulae. The patient's bilateral upper chest and neck was then prepped and draped in sterile fashion using chlorhexidine solution. A time-out was performed confirming the patient's identity and operative site. Antibiotics were confirmed to be infusing. SCDs were on and functioning.    Sedation was started by anesthesia. Patient was placed in Trendelenburg position. The left infraclavicular fossa was anesthetized with local anesthetic. The left subclavian vein was then entered on the 1st attempt with return of venous blood. Guidewire was then positioned in the superior vena cava. This was confirmed using fluoroscopy.     Using fluoroscopy, the catheter was measured and estimated to choose an appropriate exit site. Following this, an exit site was anesthetized and incised inferior to the access site. The Trifusion triple lumen catheter was then tunneled subcutaneously to the entrance site of the guidewire with the subcutaneous cuff positioned at  the skin exit site.  The dilator and sheath were placed over the guidewire in the superior vena cava under fluoroscopic guidance. The dilator and wire were subsequently removed. The catheter was then threaded through the sheath into the superior vena cava. This was confirmed using fluoroscopy.     The catheter was then aspirated and flushed through all 3 lumens. Good return of venous blood was noted, and flushed easily. Wounds were inspected. Good hemostasis was noted. Following this, the access site wound was then closed using 4-0 monocryl. The catheter was secured to the skin using 3-0 prolene sutures. Sterile dressing including BioPatch and tegaderm was applied. All counts were correct at the end of the procedure.     The patient tolerated the procedure well and was taken to the recovery room in stable condition. Portable chest radiograph ordered for PACU.

## 2016-08-17 NOTE — Anesthesia Post-Procedure Evaluation (Signed)
Anesthesia Post-op Note    Patient: David Watkins    Procedure(s) Performed: Left subclavian triple lumen Trifusion catheter (27 cm) insertion with fluoroscopic guidance    DOS: 12.18.17    Surgeon: Lizbeth Bark     Anesthesia type: TIVA    Post-op assessment:  Anesthetic Problems: no   Last Vitals:    Vitals:    08/17/16 1030   BP: 94/60   Pulse: 75   Resp: 14   Temp:    SpO2: 96%     Cardiovascular System Stable: yes  Respiratory Function: Airway Patent yes  ETT no  Ventilator no  Level of consciousness: awake, alert and oriented  Post-op pain: adequate analgesia  Hydration Adequate: yes  Nausea/Vomiting:no  Other Issues:

## 2016-08-17 NOTE — Discharge Instructions (Signed)
[place] may be drowsy or lightheaded after receiving sedation or anesthesia. Do not operate any vehicles (automobiles, bicycles, motorcycles) or power tools or machinery for 24 hours.  Do not sign any legal documents or make any legal decisions for 24 hours. Do not drink alcohol for 24 hours or while taking narcotic pain medication.  A responsible person should be with you for the next 24 hours.    Please follow the instructions checked below:    DIET INSTRUCTIONS:  [x] Start with light diet and progress to your normal diet as you feel like eating. If you experience nausea or repeated episodes of vomiting which persist beyond 12-24 hours, notify your doctor.  [] Other     ACTIVITY INSTRUCTIONS:  [x] Rest today. Increase activity as tolerated    [] Elevate operative limb   [] Sling to operative limb  [x] No heavy lifting or strenuous activity     [x] No driving for 24 hours   [] Use crutches  [] Use walker   [] Weight bearing: [] none /  [] partial / [] full as tolerated   [] Other     WOUND/DRESSING INSTRUCTIONS:       Always ensure you and your care giver clean hands before and after caring for the wound.  [x] May shower  tomorrow    [] May bathe                                                   [] Keep dressing dry            [x] Do not remove dressing   [] Remove dressing on     [] Leave steri strips in place    [x] Derma bond dressing-Do not apply liquid or ointment medications or any other product to your wound while the adhesive dressing is in place. These may loosen the film before your wound is healed. You may occasionally and briefly wet your wound in the shower.  After showering, gently blot your wound dry with a soft towel.  [] Drain care      [x] Ice to operative site for 15-30 minutes of each hour while awake for 24-36 hours  [] Use ice cooling system as instructed                  [] Post-op Shoe-wear when up and about                     [] Wear bra continuously for  24-36 hours  [] Other                                                                                                                                                   MEDICATION INSTRUCTIONS:  [] Prescription(s):  sent with you.  Use as directed.  When taking pain medications, you may experience the side effect of dizziness or drowsiness.  Do not drink alcohol or drive when taking these medications.  [] Prescription(s):          Called to Pharmacy Name and location:  HAS PERCOCET AT HOME  [] You may take a non-prescription "headache remedy", preferably one that does not contain aspirin.    [x] Give the list of your medications to your primary care physician on your next visit. Keep your med list updated and carry it with in case of emergencies.    [x] If you take any blood thinning medication, such as Coumadin or Plavix, check with your surgeon on when to re-start taking it.    [] Narcotic pain medications can cause the side effect of significant constipation.  You may want to add a stool softener to your postoperative medication schedule or speak to your surgeon on how best to manage this side effect.    If you have a CPAP machine, it is very important that you use it daily during all periods of sleep and daytime rest during your recovery at home.  Surgery and Anesthesia place a significant amount of stress on your body.  Using your CPAP will help keep you safe and lessen the negative effects of that stress.    FOLLOW-UP CARE:  Call the office  for follow-up appointment     Watch for the following significant complications or symptoms.  Call physician if they or any other problems occur.  If unable to reach your physician, please go to the emergency department.  - Fever over 101    - Redness, swelling, hardness or warmth at the operative site  - Foul smelling or cloudy drainage at the operative site  - Unrelieved nausea               - Unrelieved pain    - Blood soaked dressing. (Some oozing may be  normal)  - Inability to urinate      - Numb, pale, blue, cold or tingling extremity        Physician:      The above instructions were reviewed with patient/significant other.  Copy given to patient/significant other.  The following additional patient specific information was reviewed with the patient/significant other:  [x] Procedure/physician specific instructions  [x] Medication information sheet(s) including indication for medication and possible side effects  [] Dionne's egress test  [] Pain Ball management  [] FAQ Catheter associated blood stream infections  [x] FAQ Surgical Site Infections    [] Other-         Date/time 08/17/2016 9:04 AM         PACU:  [phone]      SAME DAY SERVICES:  [phone]      (M-F 8am - 8pm)                    (M-F 6am - 6pm)        If you smoke STOP. We care about your health!    FAQs  (frequently asked questions)  About "Surgical Site Infections"    What is a Surgical Site Infection (SSI)?   A surgical site infection is an infection that occurs after surgery in the part of the body where the surgery took place.  Most patients who have surgery do not develop an infection.  However, infections develop in about 1 to 3 out of every 100 patients who have surgery.   Some common symptoms of a  surgical site infection are:   Marland Kitchen Redness and pain around the area where you had surgery  . Drainage of cloudy fluid from your surgical wound   . Fever    Can SSIs be treated?   Yes. Most surgical site infections can be treated with antibiotics.  The antibiotic given to you depends on the bacteria (germs) causing the infection.  Sometimes patients with SSIs also need another surgery to treat the infection.      What are some of the things that hospitals are doing to prevent SSIs?  To prevent SSIs, doctors, nurses and other healthcare providers:   . Clean their hands and arms up to their elbows with an antiseptic agent just before the surgery.   . Clean their hands with soap and water or an  alcohol-based hand rub before and after caring for each patient.   . May remove some of your hair immediately before your surgery using electric clippers if the hair is in the same area where the procedure will occur.  They should not shave you with a razor.   . Wear special hair covers, masks, gowns, and gloves during surgery to keep the surgery area clean.  . Give you antibiotics before your surgery starts.  In most cases, you should get antibiotics within 60 minutes before the surgery starts and the antibiotics should be stopped within 24 hours after surgery.  . Clean the skin at the site of your surgery with a special soap that kills germs.     What can I do to help prevent SSIs?   Before you surgery:  . Tell your doctor about other medical problems you may have.  Health problems such as allergies, diabetes, and obesity could affect your surgery and your treatment.    . Quit smoking.  Patients who smoke get more infections.  Talk to your doctor about how you can quit before your surgery.  . Do not shave near where you will have surgery.  Shaving with a razor can irritate your skin and make it easier to develop an infection.        At the time of your surgery:  . Speak up if someone tries to shave you with a razor before surgery.  Ask why you need to be shaved and talk with your surgeon if you have any concerns.   . Ask if you will get antibiotics before surgery.    After your surgery:  . Make sure that your healthcare providers clean their hands before examining you, either with soap and water or an alcohol-based hand rub.   . IF YOU DO NOT SEE YOUR PROVIDERS CLEAN THEIR HANDS, PLEASE ASK THEM TO DO SO.   . Family and friends who visit you should not touch the surgical wound or dressings.  . Family and friends should clean their hands with soap and water or an alcohol-based hand rub before and after visiting you.  If you do not see them clean their hands, ask them to clean their hands.     What do I need to do  when I go home from the hospital?  . Before you go home, your doctor nurses should explain everything you need to know about taking care of your wound.  Make sure you understand how to care for your wound before you leave the hospital.    . Always clean your hands before and after caring for your wound.    . Before you go home, make sure  you know who to contact if you have questions or problems after you get home.   . If you have any symptoms of an infection, such as redness and pain at the surgery site, drainage, or fever, call your doctor immediately.     If you have additional questions, please ask your doctor or nurse.     Thank you for allowing [place] to care for you.  We hope we have exceeded your expectations and provided a very good overall experience while taking care of you and your family.

## 2016-08-17 NOTE — H&P (Signed)
David Watkins    [id number]    [facility] Same Day Surgery Update H & P  Department of General Surgery   Surgical Service   CNP Pre-operative History and Physical  Last H & P within the last 30 days.    DIAGNOSIS:   MULTIPLE MYELOMA C90.00    PROCEDURE:  Trifusion Catheter Placement      HISTORY OF PRESENT ILLNESS:  A 60 year old male patient here today for surgical intervention for placement of a trifusion catheter for treatment related to multiple myeloma.    Please see initial H & P     Past Medical History:        Diagnosis Date   ??? Hyperlipidemia    ??? Hypertension    ??? Multiple myeloma (HCC)      Past Surgical History:        Procedure Laterality Date   ??? BONE MARROW BIOPSY       Past Social History:  Social History     Social History   ??? Marital status: Married     Spouse name: N/A   ??? Number of children: N/A   ??? Years of education: N/A     Social History Main Topics   ??? Smoking status: Former Smoker     Quit date: 11/25/2012   ??? Smokeless tobacco: Never Used   ??? Alcohol use Not on file   ??? Drug use: Unknown   ??? Sexual activity: Not on file     Other Topics Concern   ??? Not on file     Social History Narrative   ??? No narrative on file         Medications Prior to Admission:      Prior to Admission medications    Medication Sig Start Date End Date Taking? Authorizing Provider   dexamethasone (DECADRON) 4 MG tablet Take one tablet po BID beginning 03-22-16 as directed.  Patient taking differently: 2 mg 2 times daily Take one tablet po BID beginning 03-22-16 as directed. 03/20/16  Yes Arrie Senate, MD   umeclidinium-vilanterol [facility] ELLIPTA) 62.5-25 MCG/INH AEPB inhaler Inhale 1 puff into the lungs daily 08/06/16   Neville Route, MD   albuterol sulfate HFA (PROAIR HFA) 108 (90 Base) MCG/ACT inhaler Inhale 2 puffs into the lungs every 6 hours as needed for Wheezing 08/06/16   Neville Route, MD   ondansetron [facility]) 8 MG tablet Take 1 tablet by mouth every 8 hours as needed for Nausea or Vomiting 04/24/16    Eulis Canner, MD   zolpidem (AMBIEN) 5 MG tablet Take 5 mg by mouth nightly as needed for Sleep    Historical Provider, MD   eszopiclone (LUNESTA) 2 MG TABS Take 1 tablet by mouth nightly 04/23/16   Alain Marion, MD   lenalidomide (REVLIMID) 25 MG chemo capsule TAKE 1 CAPSULE BY MOUTH DAILY FOR 2 WEEKS ON THEN 1 WEEK OFF 04/13/16   Eulis Canner, MD   oxyCODONE-acetaminophen (PERCOCET) 7.5-325 MG per tablet Take 1 tablet by mouth every 4 hours as needed for Pain . 04/06/16   Eulis Canner, MD   prochlorperazine (COMPAZINE) 10 MG tablet Take 1 tablet by mouth every 6 hours as needed (nausea) 03/20/16   Amedeo Kinsman, NP   polyethylene glycol [facility]) packet Take 17 g by mouth daily as needed for Constipation    Historical Provider, MD   omeprazole (PRILOSEC) 20 MG delayed release capsule Take 20 mg by mouth daily  Historical Provider, MD   ondansetron (ZOFRAN) 8 MG tablet Take one tablet po daily one hour prior to radiation 03/20/16   Arrie Senate, MD   aspirin 81 MG EC tablet Take 81 mg by mouth daily    Historical Provider, MD   lisinopril (PRINIVIL;ZESTRIL) 10 MG tablet Take 10 mg by mouth daily    Historical Provider, MD   atorvastatin (LIPITOR) 10 MG tablet Take 10 mg by mouth daily    Historical Provider, MD         Allergies:  Pomalidomide; Ceclor [cefaclor]; and Lenalidomide    PHYSICAL EXAM:      BP 117/80    Pulse 70    Temp 98.4 ??F (36.9 ??C) (Oral)    Resp 18    Ht 5' 9"  (1.753 m)    Wt 192 lb (87.1 kg)    SpO2 95%    BMI 28.35 kg/m??      Heart:  regular rate and rhythm, no murmur noted on exam    Lungs:  No increased work of breathing, good air exchange, clear to auscultation bilaterally, no crackles or wheezing    Abdomen:  soft, non-distended, non-tender, no rebound tenderness or guarding, normal active bowel sounds, no masses palpated and no hepatosplenomegaly    ASSESSMENT AND PLAN:    1.  Patient seen and focused exam done today- no new changes since last physical exam on 08/06/2016    2.  Access to  ancillary services are available per request of the provider.    Kirk Ruths, CNP     08/17/2016

## 2016-08-17 NOTE — Progress Notes (Signed)
Vancomycin 1250mg  IVPB sent to OR

## 2016-08-17 NOTE — Progress Notes (Signed)
Left chest port site unremarkable with tegaderm over port insertion site and glue over incision site. No drainage or bleeding or swelling at site. Alert and oriented but lethargic and complains of surgery site discomfort. Wheeled to [place] with wife present.

## 2016-08-19 ENCOUNTER — Inpatient Hospital Stay: Attending: Hematology & Oncology | Primary: Hematology & Oncology

## 2016-08-19 ENCOUNTER — Encounter: Primary: Hematology & Oncology

## 2016-08-19 ENCOUNTER — Inpatient Hospital Stay: Admit: 2016-08-19 | Attending: Hematology & Oncology | Primary: Hematology & Oncology

## 2016-08-19 DIAGNOSIS — C9000 Multiple myeloma not having achieved remission: Secondary | ICD-10-CM

## 2016-08-19 LAB — CBC WITH AUTO DIFFERENTIAL
Basophils %: 1 %
Basophils Absolute: 0.3 10*3/uL — ABNORMAL HIGH (ref 0.0–0.2)
Eosinophils %: 1 %
Eosinophils Absolute: 0.3 10*3/uL (ref 0.0–0.6)
Hematocrit: 34.2 % — ABNORMAL LOW (ref 40.5–52.5)
Hemoglobin: 11.1 g/dL — ABNORMAL LOW (ref 13.5–17.5)
Lymphocytes %: 3 %
Lymphocytes Absolute: 0.8 10*3/uL — ABNORMAL LOW (ref 1.0–5.1)
MCH: 29.2 pg (ref 26.0–34.0)
MCHC: 32.4 g/dL (ref 31.0–36.0)
MCV: 90.2 fL (ref 80.0–100.0)
MPV: 7 fL (ref 5.0–10.5)
Metamyelocytes Relative: 2 % — AB
Monocytes %: 8 %
Monocytes Absolute: 2.1 10*3/uL — ABNORMAL HIGH (ref 0.0–1.3)
Neutrophils %: 85 %
Neutrophils Absolute: 23.3 10*3/uL — ABNORMAL HIGH (ref 1.7–7.7)
Platelets: 216 10*3/uL (ref 135–450)
RBC Morphology: NORMAL
RBC: 3.79 M/uL — ABNORMAL LOW (ref 4.20–5.90)
RDW: 13.8 % (ref 12.4–15.4)
WBC: 26.8 10*3/uL — ABNORMAL HIGH (ref 4.0–11.0)

## 2016-08-19 LAB — COMPREHENSIVE METABOLIC PANEL
ALT: 20 U/L (ref 10–40)
AST: 25 U/L (ref 15–37)
Albumin/Globulin Ratio: 1.3 (ref 1.1–2.2)
Albumin: 3.8 g/dL (ref 3.4–5.0)
Alkaline Phosphatase: 171 U/L — ABNORMAL HIGH (ref 40–129)
Anion Gap: 11 (ref 3–16)
BUN: 4 mg/dL — ABNORMAL LOW (ref 7–20)
CO2: 26 mmol/L (ref 21–32)
Calcium: 8.9 mg/dL (ref 8.3–10.6)
Chloride: 101 mmol/L (ref 99–110)
Creatinine: 0.8 mg/dL — ABNORMAL LOW (ref 0.9–1.3)
GFR African American: >60 (ref >60)
GFR Non-African American: >60 (ref >60)
Globulin: 2.9 g/dL
Glucose: 102 mg/dL — ABNORMAL HIGH (ref 70–99)
Potassium: 3.9 mmol/L (ref 3.5–5.1)
Sodium: 138 mmol/L (ref 136–145)
Total Bilirubin: 0.3 mg/dL (ref 0.0–1.0)
Total Protein: 6.7 g/dL (ref 6.4–8.2)

## 2016-08-19 LAB — CBC
Hematocrit: 32.9 % — ABNORMAL LOW (ref 40.5–52.5)
Hemoglobin: 10.6 g/dL — ABNORMAL LOW (ref 13.5–17.5)
MCH: 28.8 pg (ref 26.0–34.0)
MCHC: 32.2 g/dL (ref 31.0–36.0)
MCV: 89.6 fL (ref 80.0–100.0)
MPV: 6.4 fL (ref 5.0–10.5)
Platelets: 160 10*3/uL (ref 135–450)
RBC: 3.67 M/uL — ABNORMAL LOW (ref 4.20–5.90)
RDW: 13.6 % (ref 12.4–15.4)
WBC: 29.5 10*3/uL — ABNORMAL HIGH (ref 4.0–11.0)

## 2016-08-19 LAB — MAGNESIUM: Magnesium: 1.7 mg/dL — ABNORMAL LOW (ref 1.80–2.40)

## 2016-08-19 MED ORDER — TBO-FILGRASTIM 480 MCG/0.8ML SC SOSY
480 MCG/0.8ML | Freq: Once | SUBCUTANEOUS | Status: AC
Start: 2016-08-19 — End: 2016-08-19
  Administered 2016-08-19: 12:00:00 960 ug via SUBCUTANEOUS

## 2016-08-19 MED ORDER — CALCIUM GLUCONATE 10 % IV SOLN
10 % | Freq: Once | INTRAVENOUS | Status: AC
Start: 2016-08-19 — End: 2016-08-19
  Administered 2016-08-19: 13:00:00 4 g via INTRAVENOUS

## 2016-08-19 MED FILL — GRANIX 480 MCG/0.8ML SC SOSY: 480 MCG/0.8ML | SUBCUTANEOUS | Qty: 1.6

## 2016-08-19 MED FILL — CALCIUM GLUCONATE 10 % IV SOLN: 10 % | INTRAVENOUS | Qty: 40

## 2016-08-19 NOTE — Progress Notes (Signed)
Pt arrived to [facility] today for scheduled stem cell pheresis procedure.  Apheresis, line care, education, & lab draws all completed by [facility] RN, Verlan Friends.  Review [facility] documentation for details.

## 2016-08-19 NOTE — Progress Notes (Signed)
Post-procedure CBC results were as follows: hemoglobin of 10.6 g/dL, and platelets of 160 k/uL.  No post-procedure blood products were required per parameters dictated.

## 2016-08-19 NOTE — Plan of Care (Signed)
Problem: Pain:  Goal: Pain level will decrease  Pain level will decrease   Outcome: Ongoing    Goal: Control of acute pain  Control of acute pain   Outcome: Ongoing    Goal: Control of chronic pain  Control of chronic pain   Outcome: Ongoing

## 2016-08-19 NOTE — Progress Notes (Signed)
Pt received 985mcg of Granix to the right arm. Pt tolerated well. No complaints.

## 2016-08-19 NOTE — Discharge Instructions (Signed)
Thank you for visiting the Outpatient Oncology and [place] at [facility] on August 19, 2016. Your nurse today was Renato Battles.      At the [facility] we are dedicated to making sure you have a positive experience. If there's any way we can be of further assistance or service to you, please don't hesitate to ask.    Please review attached discharge instructions prior to leaving the infusion area and let your nurse know if you have any questions.    If you have questions about these instructions after leaving the infusion area, please don't hesitate to call us with any questions or concerns.    Thank you,    Outpatient Oncology and [place] Staff    Treatment Suite:     [phone]  Manager: Erick Alley    [phone]  Clinical Coordinator: Deatra James  [phone]

## 2016-08-19 NOTE — Progress Notes (Incomplete)
Autologous stem cell collection    Dx:    Indication: anticipation of stem cell transplant     Day + of stem cell collection with  neupogen/mozobil    S: Pt feels well with the exception of mild weakness and fatigue. The remainder of her ros is neg as noted below.     ROS  ?? Constitutional: Denies fever, sweats, weight loss. .     ?? Eyes: No visual changes or diplopia. No scleral icterus.  ?? ENT: No Headaches, hearing loss or vertigo. No mouth sores or sore throat.  ?? Cardiovascular: No chest pain, dyspnea on exertion, palpitations or loss of consciousness.   ?? Respiratory: No cough or wheezing, no sputum production. No hemoptysis..    ?? Gastrointestinal: No abdominal pain, appetite loss, blood in stools. No change in bowel habits.  ?? Genitourinary: No dysuria, trouble voiding, or hematuria.  ?? Musculoskeletal:  Generalized weakness. No joint complaints.  ?? Integumentary: No rash or pruritis.  ?? Neurological: No headache, diplopia. No change in gait, balance, or coordination. No paresthesias.  ?? Endocrine: No temperature intolerance. No excessive thirst, fluid intake, or urination.   ?? Hematologic/Lymphatic: No abnormal bruising or ecchymoses, blood clots or swollen lymph nodes.  ?? Allergic/Immunologic: No nasal congestion or hives.       O : There were no vitals taken for this visit.    BP 119/80 Comment: per [facility]   Pulse 81 Comment: per [facility]   Temp 98.3 ??F (36.8 ??C) (Oral) Comment: per [facility] Comment (Src): per [facility]   Resp 22 Comment: per [facility].          HEENT-nl        Neck supple. No thyromegaly or adenopathy        Lymph - no cervical, axillary or supraclavicular adenopathy        Chest - clear to P&A        S1 S2 nl, no M, G, R.         Abd soft, non-tender, No hepatosplenomegaly. BS nl No mass        Musculoskeletal -clinically appropriate strength        Extr- No cyanosis or edema        Neuro- Alert and oriented. No focal findings. DTRs 2+ all groups        Skin- no rash    Lab Results    Component Value Date    WBC 29.5 (H) 08/19/2016    HGB 10.6 (L) 08/19/2016    HCT 32.9 (L) 08/19/2016    MCV 89.6 08/19/2016    PLT 160 08/19/2016     Lab Results   Component Value Date    NA 138 08/19/2016    K 3.9 08/19/2016    CL 101 08/19/2016    CO2 26 08/19/2016    BUN 4 08/19/2016    CREATININE 0.8 08/19/2016    GLUCOSE 102 08/19/2016    GLUCOSE 110 04/24/2016    CALCIUM 8.9 08/19/2016    PROT 6.7 08/19/2016    PROT 6.6 04/24/2016    LABALBU 3.8 08/19/2016    BILITOT 0.3 08/19/2016    ALKPHOS 171 08/19/2016    AST 25 08/19/2016    ALT 20 08/19/2016            Description of procedure:   Pt underwent a 20lt collection at a flow rate of 63mls/min. Pt tolerated the procedure well with no complications. He received Ca gluconate empirically. Access was via rt upper  chest wall neostar cath which is intact at the insertion site.     A/P    day  stem cell collection with neupogen/mozobil, goal is 5-10 mil cd 34+/kg. She will be notified of the cell number later this evening.     Toxicities: mild weakness and fatigue     [employer]

## 2016-08-20 ENCOUNTER — Encounter: Primary: Hematology & Oncology

## 2016-08-20 ENCOUNTER — Encounter: Admit: 2016-08-20 | Primary: Hematology & Oncology

## 2016-08-20 ENCOUNTER — Inpatient Hospital Stay: Admit: 2016-08-20 | Attending: Hematology & Oncology | Primary: Hematology & Oncology

## 2016-08-20 DIAGNOSIS — C9000 Multiple myeloma not having achieved remission: Secondary | ICD-10-CM

## 2016-08-20 LAB — CBC WITH AUTO DIFFERENTIAL
Basophils %: 0 %
Basophils Absolute: 0 10*3/uL (ref 0.0–0.2)
Eosinophils %: 1 %
Eosinophils Absolute: 0.3 10*3/uL (ref 0.0–0.6)
Hematocrit: 33.5 % — ABNORMAL LOW (ref 40.5–52.5)
Hemoglobin: 11.2 g/dL — ABNORMAL LOW (ref 13.5–17.5)
Lymphocytes %: 3 %
Lymphocytes Absolute: 1 10*3/uL (ref 1.0–5.1)
MCH: 29.5 pg (ref 26.0–34.0)
MCHC: 33.3 g/dL (ref 31.0–36.0)
MCV: 88.7 fL (ref 80.0–100.0)
MPV: 6.7 fL (ref 5.0–10.5)
Monocytes %: 7 %
Monocytes Absolute: 2.3 10*3/uL — ABNORMAL HIGH (ref 0.0–1.3)
Myelocyte Percent: 1 % — AB
Neutrophils %: 88 %
Neutrophils Absolute: 29.2 10*3/uL — ABNORMAL HIGH (ref 1.7–7.7)
Platelets: 144 10*3/uL (ref 135–450)
RBC Morphology: NORMAL
RBC: 3.78 M/uL — ABNORMAL LOW (ref 4.20–5.90)
RDW: 13.7 % (ref 12.4–15.4)
WBC: 32.8 10*3/uL — ABNORMAL HIGH (ref 4.0–11.0)

## 2016-08-20 LAB — CBC
Hematocrit: 31.9 % — ABNORMAL LOW (ref 40.5–52.5)
Hemoglobin: 10.5 g/dL — ABNORMAL LOW (ref 13.5–17.5)
MCH: 29.2 pg (ref 26.0–34.0)
MCHC: 32.8 g/dL (ref 31.0–36.0)
MCV: 89 fL (ref 80.0–100.0)
MPV: 6.2 fL (ref 5.0–10.5)
Platelets: 101 10*3/uL — ABNORMAL LOW (ref 135–450)
RBC: 3.58 M/uL — ABNORMAL LOW (ref 4.20–5.90)
RDW: 13.7 % (ref 12.4–15.4)
WBC: 35.1 10*3/uL — ABNORMAL HIGH (ref 4.0–11.0)

## 2016-08-20 MED ORDER — SODIUM CHLORIDE 0.9 % IV SOLN
0.9 % | Freq: Once | INTRAVENOUS | Status: AC
Start: 2016-08-20 — End: 2016-08-20
  Administered 2016-08-20: 13:00:00 4 g via INTRAVENOUS

## 2016-08-20 MED ORDER — TBO-FILGRASTIM 480 MCG/0.8ML SC SOSY
480 MCG/0.8ML | Freq: Once | SUBCUTANEOUS | Status: AC
Start: 2016-08-20 — End: 2016-08-20
  Administered 2016-08-20: 12:00:00 960 ug via SUBCUTANEOUS

## 2016-08-20 MED FILL — CALCIUM GLUCONATE 10 % IV SOLN: 10 % | INTRAVENOUS | Qty: 40

## 2016-08-20 MED FILL — GRANIX 480 MCG/0.8ML SC SOSY: 480 MCG/0.8ML | SUBCUTANEOUS | Qty: 1.6

## 2016-08-20 NOTE — Progress Notes (Signed)
Patient's post stem-cell apheresis CBC results revealed a hemoglobin of 10.5 g/dL, and platelets of 101 k/uL.  No further intervention is indicated per protocol parameters.  The patient is discharged ambulatory to home with daughter.

## 2016-08-20 NOTE — Progress Notes (Signed)
Pt arrived to [facility] today for scheduled stem cell phoresis procedure.  Today is Day 2.  His only complaints were of pain, rated a 5/10- which is not abnormal for him, and is being managed with an existing chronic pain regimen that the patient feels is sufficient.  He also stated experiencing some nausea this morning with was resolved with home-administration of compazine.  Apheresis, line care, education, & lab draws all completed by [facility] RN, Juliann Pulse.  Review [facility] documentation for details.

## 2016-08-20 NOTE — Progress Notes (Signed)
Pt in for Granix shots: 2 x in L arm.  Pt tolerated well.      Pt's armband would not scan at this time, but name and DOB verified according to policy.

## 2016-08-20 NOTE — Plan of Care (Signed)
Problem: Pain:  Goal: Pain level will decrease  Pain level will decrease   Outcome: Ongoing    Goal: Control of acute pain  Control of acute pain   Outcome: Ongoing    Goal: Control of chronic pain  Control of chronic pain   Outcome: Ongoing

## 2016-08-21 ENCOUNTER — Inpatient Hospital Stay: Admit: 2016-08-21 | Attending: Hematology & Oncology | Primary: Hematology & Oncology

## 2016-08-21 ENCOUNTER — Encounter: Primary: Hematology & Oncology

## 2016-08-21 ENCOUNTER — Inpatient Hospital Stay: Attending: Hematology & Oncology | Primary: Hematology & Oncology

## 2016-08-21 MED ORDER — HEPARIN SOD (PORK) LOCK FLUSH 100 UNIT/ML IV SOLN
100 UNIT/ML | INTRAVENOUS | Status: DC | PRN
Start: 2016-08-21 — End: 2016-08-23
  Administered 2016-08-21: 23:00:00 900 [IU]

## 2016-08-21 MED FILL — HEPARIN LOCK FLUSH 100 UNIT/ML IV SOLN: 100 UNIT/ML | INTRAVENOUS | Qty: 10

## 2016-08-21 NOTE — Plan of Care (Signed)
Problem: Infection - Central Venous Catheter-Associated Bloodstream Infection:  Goal: Will show no infection signs and symptoms  Will show no infection signs and symptoms  Outcome: Ongoing  Pt seen and assessed at [place] today for line care needs. CVC site remains free of signs/symptoms of infection. No drainage, edema, erythema, pain, or warmth noted at site. Line care performed per flowsheet.  CVC need continues d/t ongoing outpt therapy. Pt verbalizes understanding of discharge instructions.  Discharged ambulatory

## 2016-08-21 NOTE — Discharge Instructions (Signed)
Learning About Your Central Venous Catheter: Changing the Dressing  What is a central venous catheter?  A central venous catheter is a thin, flexible tube. It is also called a central line or C-line. Central lines are used when you need to receive medicine, fluids, nutrients, or blood products for several weeks or more. The fluids are put through the central line so that they move quickly into the bloodstream. The line can be used many times, so you are not stuck with a needle every time.  A central line is put through the skin into a vein, often in the neck or chest, and threaded through the vein until the tip of the catheter reaches a large vein near the heart. The point where the central line leaves the skin is called the exit site. Usually about 12 inches of the line stay outside of the body. Sometimes the line has two or three ends so that you can get more than one medicine at a time. These ends are called lumens. The end of each lumen is covered with a cap.  Sometimes the central line is completely under the skin. The central line may also be put in through the arm.  You will feel a little pain when the doctor numbs the area. You will not feel any pain when the central line is put in. You may be a little sore for a day or two. You can take over-the-counter pain medicine, such as Tylenol or Advil, for relief.  General guidelines   Try to keep the exit site dry. When you shower, cover the site with waterproof material, such as plastic wrap. Be sure you cover both the exit site and the central line cap(s).   Never touch the open end of the central line if the cap is off.   Never use scissors, knives, pins, or other sharp objects near the central line or other tubing.   If your central line has a clamp, keep it clamped when you are not using it.   Fasten or tape the central line to your body to prevent pulling or dangling.   Avoid clothing that rubs or pulls on your central line.   Avoid bending or  crimping your central line.   Always wash your hands before you touch your central line.   Check the central line every day for signs of infection. These include pain, tenderness, swelling, drainage, pus, redness, or warmth at or near the exit site.  How to change the dressing  If you just got your central line, do not let the exit site get wet for 72 hours. Avoid exercise until your doctor says it is okay.  If you have a gauze dressing, change it every 48 hours. If you have a clear plastic dressing, change it every 5 days. Also change your dressing if it is damp, bloody, loose, or dirty. Your doctor may also give you directions for when to change the dressing.  Be sure you have all your supplies ready. These include medical tape, a surgical mask, sterile gloves, your dressing, an applicator, and skin-protecting swabs. The names and brands of the items will vary. Your doctor or nurse may give you specific instructions for changing the dressing.  1. Wash your hands with soap and water for 15 seconds. Dry them with paper towels.  2. Put on the surgical mask.  3. Loosen and remove your old dressing. Peel the dressing toward the central line, not away from it. You may need   to use an adhesive remover if it does not come off easily.  4. Look at the area carefully for redness, swelling, drainage, tenderness, or warmth. If you notice any of these, call your doctor.  5. Wash your hands again, and put on the sterile gloves.  6. Clean the area with the applicator your doctor gave you or with alcohol and swabs. Clean in an up-down or side-to-side motion. When you have finished, let the area dry for about 30 seconds.  7. Swab the edges of the cleaned area with the skin protector.  8. Remove the backing from the dressing your doctor gave you, and place it over the site.  9. Tape the central line to your skin so it will not dangle or pull.  When should you call for help?  Call 911 anytime you think you may need emergency care.  For example, call if:   You passed out (lost consciousness).   You have severe trouble breathing.   You have sudden chest pain and shortness of breath, or you cough up blood.   You have a fast or uneven pulse.  Call your doctor now or seek immediate medical care if:   You have signs of infection, such as:   Increased pain, swelling, warmth, or redness.   Red streaks leading from the exit site.   Pus or blood draining from the exit site.   A fever.   You have a fever over 100F, or you have chills.   You have swelling in your face, chest, neck, or arm on the side where the central line is.   You have signs of a blood clot, such as bulging veins near the catheter.   Your central line is leaking.   You feel resistance when you inject medicine or fluids into your line.   Your central line is out of place. This may happen after severe coughing or vomiting, or if you pull on the central line.  Watch closely for changes in your health, and be sure to contact your doctor if:   You have any concerns about your line.  Follow-up care is a key part of your treatment and safety. Be sure to make and go to all appointments, and call your doctor if you are having problems. It's also a good idea to know your test results and keep a list of the medicines you take.  Where can you learn more?  Go to [email or web address] and sign in to your MyChart account. Enter Q250 in the Search Health Information box to learn more about "Learning About Your Central Venous Catheter: Changing the Dressing."     If you do not have an account, please click on the "Sign Up Now" link.  Current as of: November 18, 2015  Content Version: 11.4   2006-2017 [employer]. Care instructions adapted under license by [facility]. If you have questions about a medical condition or this instruction, always ask your healthcare professional. Healthwise, Incorporated disclaims any warranty or liability for your use of this  information.

## 2016-08-25 NOTE — Progress Notes (Signed)
David Watkins is here today with friend, [name] for his pre admit teaching and exam.  We reviewed the autologous transplant  plan.  The plan is  08/26/16 admission for High Dose Melphalan chemotherapy & Autologous stem cell  transplant.  We reviewed the  1. The transplant plan calendar  2. Inpatient experience,  and the discharge plan, including precautions and restrictions.  3 .I witness the signing of the transplant consent  4. He had catheter care today and an [facility] CBC.  5. We reviewed his medication list and added the new pulmonary inhalers.  6. CBC was completed,reviewed and is acceptable.  7. Product summary had been emailed to [employer].  8. Transplant Infusion information form has been sent out to the transplant TEAM.  9. Admission packet has been given to [facility].  Patient and caregiver asked multiple questions and explanations were given.   Patient and caregiver verbalized understanding.   They agree to the plan.   Barbie Banner RN BSN OCN [facility]MT[facility]  Transplant coordinator

## 2016-08-26 ENCOUNTER — Inpatient Hospital Stay
Admit: 2016-08-26 | Discharge: 2016-09-16 | Disposition: A | Discharge status: Home / Self Care | Payer: BLUE CROSS/BLUE SHIELD | Source: Other Acute Inpatient Hospital | Attending: Hematology & Oncology | Admitting: Hematology & Oncology | Primary: Hematology & Oncology

## 2016-08-26 ENCOUNTER — Inpatient Hospital Stay: Admit: 2016-08-26 | Primary: Hematology & Oncology

## 2016-08-26 DIAGNOSIS — C9001 Multiple myeloma in remission: Principal | ICD-10-CM

## 2016-08-26 LAB — CBC WITH AUTO DIFFERENTIAL
Basophils %: 0.4 %
Basophils Absolute: 0 10*3/uL (ref 0.0–0.2)
Eosinophils %: 3.5 %
Eosinophils Absolute: 0.2 10*3/uL (ref 0.0–0.6)
Hematocrit: 36.5 % — ABNORMAL LOW (ref 40.5–52.5)
Hemoglobin: 12.2 g/dL — ABNORMAL LOW (ref 13.5–17.5)
Lymphocytes %: 10 %
Lymphocytes Absolute: 0.6 10*3/uL — ABNORMAL LOW (ref 1.0–5.1)
MCH: 29.3 pg (ref 26.0–34.0)
MCHC: 33.4 g/dL (ref 31.0–36.0)
MCV: 87.6 fL (ref 80.0–100.0)
MPV: 7.6 fL (ref 5.0–10.5)
Monocytes %: 11.1 %
Monocytes Absolute: 0.6 10*3/uL (ref 0.0–1.3)
Neutrophils %: 75 %
Neutrophils Absolute: 4.2 10*3/uL (ref 1.7–7.7)
Platelets: 191 10*3/uL (ref 135–450)
RBC: 4.17 M/uL — ABNORMAL LOW (ref 4.20–5.90)
RDW: 13.5 % (ref 12.4–15.4)
WBC: 5.6 10*3/uL (ref 4.0–11.0)

## 2016-08-26 LAB — MICROSCOPIC URINALYSIS

## 2016-08-26 LAB — URINALYSIS
Bilirubin Urine: NEGATIVE
Blood, Urine: NEGATIVE
Glucose, Ur: NEGATIVE mg/dL
Ketones, Urine: NEGATIVE mg/dL
Leukocyte Esterase, Urine: NEGATIVE
Nitrite, Urine: NEGATIVE
Specific Gravity, UA: >=1.03 (ref 1.005–1.030)
Urobilinogen, Urine: 0.2 E.U./dL (ref <2.0)
pH, UA: 5.5 (ref 5.0–8.0)

## 2016-08-26 LAB — BASIC METABOLIC PANEL
Anion Gap: 12 (ref 3–16)
BUN: 13 mg/dL (ref 7–20)
CO2: 25 mmol/L (ref 21–32)
Calcium: 9.1 mg/dL (ref 8.3–10.6)
Chloride: 103 mmol/L (ref 99–110)
Creatinine: 0.8 mg/dL — ABNORMAL LOW (ref 0.9–1.3)
GFR African American: >60 (ref >60)
GFR Non-African American: >60 (ref >60)
Glucose: 107 mg/dL — ABNORMAL HIGH (ref 70–99)
Potassium: 4.4 mmol/L (ref 3.5–5.1)
Sodium: 140 mmol/L (ref 136–145)

## 2016-08-26 LAB — MAGNESIUM: Magnesium: 1.9 mg/dL (ref 1.80–2.40)

## 2016-08-26 LAB — PHOSPHORUS: Phosphorus: 3.5 mg/dL (ref 2.5–4.9)

## 2016-08-26 LAB — PROTIME-INR
INR: 1.07 (ref 0.85–1.15)
Protime: 12.1 s (ref 9.6–13.0)

## 2016-08-26 LAB — HEPATIC FUNCTION PANEL
ALT: 19 U/L (ref 10–40)
AST: 25 U/L (ref 15–37)
Albumin: 4 g/dL (ref 3.4–5.0)
Alkaline Phosphatase: 125 U/L (ref 40–129)
Bilirubin, Direct: <0.2 mg/dL (ref 0.0–0.3)
Total Bilirubin: 0.4 mg/dL (ref 0.0–1.0)
Total Protein: 6.7 g/dL (ref 6.4–8.2)

## 2016-08-26 LAB — LACTATE DEHYDROGENASE: LD: 224 U/L — ABNORMAL HIGH (ref 100–190)

## 2016-08-26 LAB — APTT: aPTT: 26.9 s (ref 24.1–34.9)

## 2016-08-26 LAB — URIC ACID: Uric Acid, Serum: 4.4 mg/dL (ref 3.5–7.2)

## 2016-08-26 MED ORDER — LORAZEPAM 2 MG/ML IJ SOLN
2 MG/ML | INTRAMUSCULAR | Status: DC | PRN
Start: 2016-08-26 — End: 2016-09-14
  Administered 2016-09-01: 17:00:00 0.5 mg via INTRAVENOUS

## 2016-08-26 MED ORDER — ALBUTEROL SULFATE HFA 108 (90 BASE) MCG/ACT IN AERS
10890 (90 Base) MCG/ACT | Freq: Four times a day (QID) | RESPIRATORY_TRACT | Status: DC | PRN
Start: 2016-08-26 — End: 2016-09-16

## 2016-08-26 MED ORDER — FUROSEMIDE 10 MG/ML IJ SOLN
10 MG/ML | Freq: Two times a day (BID) | INTRAMUSCULAR | Status: DC
Start: 2016-08-26 — End: 2016-09-03
  Administered 2016-08-27 – 2016-08-29 (×3): 40 mg via INTRAVENOUS

## 2016-08-26 MED ORDER — LORAZEPAM 0.5 MG PO TABS
0.5 MG | ORAL | Status: DC | PRN
Start: 2016-08-26 — End: 2016-09-16
  Administered 2016-09-01: 03:00:00 0.5 mg via ORAL

## 2016-08-26 MED ORDER — ALTEPLASE 2 MG IJ SOLR
2 MG | INTRAMUSCULAR | Status: DC | PRN
Start: 2016-08-26 — End: 2016-09-16

## 2016-08-26 MED ORDER — MELPHALAN 5 MG/ML UNDILUTED IVPB
Freq: Once | INTRAVENOUS | Status: AC
Start: 2016-08-26 — End: 2016-08-27
  Administered 2016-08-27: 15:00:00 200 mg via INTRAVENOUS

## 2016-08-26 MED ORDER — KCL IN DEXTROSE-NACL 10-5-0.45 MEQ/L-%-% IV SOLN
INTRAVENOUS | Status: DC
Start: 2016-08-26 — End: 2016-09-09
  Administered 2016-08-26 – 2016-09-09 (×37): via INTRAVENOUS

## 2016-08-26 MED ORDER — ONDANSETRON HCL 8 MG PO TABS
8 MG | Freq: Once | ORAL | Status: AC
Start: 2016-08-26 — End: 2016-08-27
  Administered 2016-08-27: 14:00:00 24 mg via ORAL

## 2016-08-26 MED ORDER — DEXAMETHASONE 4 MG PO TABS
4 MG | Freq: Once | ORAL | Status: AC
Start: 2016-08-26 — End: 2016-08-27
  Administered 2016-08-27: 14:00:00 12 mg via ORAL

## 2016-08-26 MED ORDER — ONDANSETRON HCL 8 MG PO TABS
8 MG | Freq: Once | ORAL | Status: AC
Start: 2016-08-26 — End: 2016-08-26
  Administered 2016-08-26: 21:00:00 24 mg via ORAL

## 2016-08-26 MED ORDER — SALINE MOUTHWASH
Status: DC | PRN
Start: 2016-08-26 — End: 2016-09-16
  Administered 2016-08-28 – 2016-09-16 (×22): 15 mL via ORAL

## 2016-08-26 MED ORDER — PROCHLORPERAZINE EDISYLATE 5 MG/ML IJ SOLN
5 MG/ML | INTRAMUSCULAR | Status: DC | PRN
Start: 2016-08-26 — End: 2016-09-14
  Administered 2016-08-31 – 2016-09-04 (×5): 10 mg via INTRAVENOUS

## 2016-08-26 MED ORDER — SODIUM CHLORIDE 0.9 % IV SOLN
0.9 % | INTRAVENOUS | Status: AC
Start: 2016-08-26 — End: 2016-08-26
  Administered 2016-08-26: 23:00:00

## 2016-08-26 MED ORDER — UMECLIDINIUM-VILANTEROL 62.5-25 MCG/INH IN AEPB
Freq: Every day | RESPIRATORY_TRACT | Status: DC
Start: 2016-08-26 — End: 2016-09-16
  Administered 2016-08-27 – 2016-09-15 (×15): 1 via RESPIRATORY_TRACT

## 2016-08-26 MED ORDER — SODIUM CHLORIDE 0.9 % IV SOLN
0.9 % | INTRAVENOUS | Status: DC | PRN
Start: 2016-08-26 — End: 2016-09-16

## 2016-08-26 MED ORDER — MELPHALAN 5 MG/ML UNDILUTED IVPB
Freq: Once | INTRAVENOUS | Status: AC
Start: 2016-08-26 — End: 2016-08-26
  Administered 2016-08-26: 22:00:00 200 mg via INTRAVENOUS

## 2016-08-26 MED ORDER — OXYCODONE HCL 15 MG PO TABS
15 MG | ORAL | Status: DC | PRN
Start: 2016-08-26 — End: 2016-08-30
  Administered 2016-08-26 – 2016-08-30 (×13): 7.5 mg via ORAL

## 2016-08-26 MED ORDER — POLYETHYLENE GLYCOL 3350 17 G PO PACK
17 g | Freq: Every day | ORAL | Status: DC | PRN
Start: 2016-08-26 — End: 2016-09-14

## 2016-08-26 MED ORDER — ENOXAPARIN SODIUM 40 MG/0.4ML SC SOLN
400.4 MG/0.4ML | Freq: Every day | SUBCUTANEOUS | Status: AC
Start: 2016-08-26 — End: 2016-09-03
  Administered 2016-08-27 – 2016-09-03 (×8): 40 mg via SUBCUTANEOUS

## 2016-08-26 MED ORDER — SODIUM CHLORIDE 0.9 % IV SOLN
0.9 % | Freq: Once | INTRAVENOUS | Status: AC
Start: 2016-08-26 — End: 2016-08-26
  Administered 2016-08-26: 21:00:00 150 mg via INTRAVENOUS

## 2016-08-26 MED ORDER — LISINOPRIL 10 MG PO TABS
10 MG | Freq: Every day | ORAL | Status: DC
Start: 2016-08-26 — End: 2016-09-08
  Administered 2016-08-27 – 2016-09-08 (×13): 10 mg via ORAL

## 2016-08-26 MED ORDER — MAGNESIUM SULFATE 4000 MG/100 ML IVPB PREMIX
4 GM/100ML | INTRAVENOUS | Status: DC | PRN
Start: 2016-08-26 — End: 2016-09-16
  Administered 2016-09-14: 13:00:00 4 g via INTRAVENOUS

## 2016-08-26 MED ORDER — POTASSIUM CHLORIDE 20 MEQ/50ML IV SOLN
20 MEQ/50ML | INTRAVENOUS | Status: DC | PRN
Start: 2016-08-26 — End: 2016-09-16
  Administered 2016-09-07 – 2016-09-14 (×12): 20 meq via INTRAVENOUS

## 2016-08-26 MED ORDER — NORMAL SALINE FLUSH 0.9 % IV SOLN
0.9 % | Freq: Two times a day (BID) | INTRAVENOUS | Status: DC
Start: 2016-08-26 — End: 2016-09-16
  Administered 2016-08-26 – 2016-09-16 (×38): 10 mL via INTRAVENOUS

## 2016-08-26 MED ORDER — MAGNESIUM HYDROXIDE 400 MG/5ML PO SUSP
400 MG/5ML | Freq: Every day | ORAL | Status: DC | PRN
Start: 2016-08-26 — End: 2016-09-16

## 2016-08-26 MED ORDER — LORAZEPAM 1 MG PO TABS
1 MG | Freq: Every evening | ORAL | Status: DC | PRN
Start: 2016-08-26 — End: 2016-09-16
  Administered 2016-08-27 – 2016-09-16 (×19): 1 mg via ORAL

## 2016-08-26 MED ORDER — VALACYCLOVIR HCL 500 MG PO TABS
500 MG | Freq: Two times a day (BID) | ORAL | Status: DC
Start: 2016-08-26 — End: 2016-09-16
  Administered 2016-08-27 – 2016-09-16 (×42): 500 mg via ORAL

## 2016-08-26 MED ORDER — PANTOPRAZOLE SODIUM 40 MG PO TBEC
40 | Freq: Every day | ORAL | Status: DC
Start: 2016-08-26 — End: 2016-09-16
  Administered 2016-08-27 – 2016-09-16 (×21): 40 mg via ORAL

## 2016-08-26 MED ORDER — FLUCONAZOLE 200 MG PO TABS
200 MG | Freq: Every day | ORAL | Status: DC
Start: 2016-08-26 — End: 2016-09-10
  Administered 2016-08-28 – 2016-09-10 (×14): 400 mg via ORAL

## 2016-08-26 MED ORDER — SALINE MOUTHWASH
Freq: Four times a day (QID) | Status: DC
Start: 2016-08-26 — End: 2016-09-16
  Administered 2016-08-27 – 2016-09-09 (×35): 15 mL via ORAL
  Administered 2016-09-09: 16:00:00 via ORAL
  Administered 2016-09-09 – 2016-09-16 (×19): 15 mL via ORAL

## 2016-08-26 MED ORDER — MORPHINE SULFATE ER 15 MG PO TBCR
15 MG | Freq: Two times a day (BID) | ORAL | Status: DC
Start: 2016-08-26 — End: 2016-08-31
  Administered 2016-08-27 – 2016-08-31 (×10): 15 mg via ORAL

## 2016-08-26 MED ORDER — DEXAMETHASONE 4 MG PO TABS
4 MG | Freq: Once | ORAL | Status: AC
Start: 2016-08-26 — End: 2016-08-26
  Administered 2016-08-26: 21:00:00 12 mg via ORAL

## 2016-08-26 MED ORDER — DEXTROSE-NACL 5-0.45 % IV SOLN
INTRAVENOUS | Status: AC
Start: 2016-08-26 — End: 2016-08-26
  Administered 2016-08-26: 17:00:00 1000 mL via INTRAVENOUS

## 2016-08-26 MED ORDER — CETIRIZINE HCL 10 MG PO TABS
10 MG | Freq: Every day | ORAL | Status: DC
Start: 2016-08-26 — End: 2016-09-16
  Administered 2016-08-26 – 2016-09-16 (×22): 10 mg via ORAL

## 2016-08-26 MED ORDER — PROCHLORPERAZINE MALEATE 10 MG PO TABS
10 MG | ORAL | Status: DC | PRN
Start: 2016-08-26 — End: 2016-09-16
  Administered 2016-08-27 – 2016-09-06 (×22): 10 mg via ORAL

## 2016-08-26 MED FILL — FOSAPREPITANT DIMEGLUMINE 150 MG IV SOLR: 150 MG | INTRAVENOUS | Qty: 150

## 2016-08-26 MED FILL — CETIRIZINE HCL 10 MG PO TABS: 10 MG | ORAL | Qty: 1

## 2016-08-26 MED FILL — KCL IN DEXTROSE-NACL 10-5-0.45 MEQ/L-%-% IV SOLN: INTRAVENOUS | Qty: 1000

## 2016-08-26 MED FILL — MELPHALAN HCL 50 MG IV SOLR: 50 MG | INTRAVENOUS | Qty: 40

## 2016-08-26 MED FILL — ONDANSETRON HCL 8 MG PO TABS: 8 MG | ORAL | Qty: 3

## 2016-08-26 MED FILL — PROVENTIL HFA 108 (90 BASE) MCG/ACT IN AERS: 108 (90 Base) MCG/ACT | RESPIRATORY_TRACT | Qty: 8

## 2016-08-26 MED FILL — DEXAMETHASONE 4 MG PO TABS: 4 MG | ORAL | Qty: 3

## 2016-08-26 MED FILL — OXYCODONE HCL 15 MG PO TABS: 15 MG | ORAL | Qty: 1

## 2016-08-26 MED FILL — DEXTROSE-NACL 5-0.45 % IV SOLN: INTRAVENOUS | Qty: 1000

## 2016-08-26 MED FILL — SODIUM CHLORIDE 0.9 % IV SOLN: 0.9 % | INTRAVENOUS | Qty: 1000

## 2016-08-26 MED FILL — SODIUM CHLORIDE 0.9 % IV SOLN: 0.9 % | INTRAVENOUS | Qty: 250

## 2016-08-26 NOTE — Oncology Nurse Navigation (Signed)
Patient received melphalan today per chemotherapy preparative regimen as ordered.  Prior to initiating melphalan infusion, patient was educated on the use of cryotherapy to prevent/decrease severe oral mucositis.  Patient verbalized understanding of procedure and performed cryotherapy as instructed for 15 minutes prior to infusion, throughout duration of infusion, and for 2 hours post infusion using ice chips and popsicles.  Pt tolerated well with no significant side effects.

## 2016-08-26 NOTE — Care Coordination-Inpatient (Addendum)
Pre-Transplant Social Work Evaluation        Name:  David Watkins             Assessment Date: 07/29/2016    SW interviewed patient in preparation for an autologous stem cell transplant.  He was diagnosed with Multiple Myeloma in July of this year.    Living Situation and Family Constellation:   Patient was born 03/27/1956 in [place], [place].  Mother is still alive and lives in [place], where one of his brothers also lives.  His father did not have much contact after his parents divorced.  The children were young at the time.  Patient is the 2nd of 5 boys.  They are scattered around, closest living in [place].  Patient has been married three times and has two children from each marriage.  His current wife, David Watkins served him divorce papers a week before he completed the assessment.  They have been married for 20 years.  They have been separated since this past summer, but have been living in the same home.  David Watkins will often leave over the weekends to be with her boyfriend in [place], according to patient.  He states this is a stressful situation, but they are making it work.  David Watkins is a Paramedic in [place] is a Equities trader.      There are 18 steps to the bedroom/bathroom in the home.    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  He has both short (80% of pay for 6 months) and long term (60% for 2 years) disability through his employer.  He and his family transferred to this area in 2014 for his job.  He is concerned if he goes to long term disability his family will need to downsize and cut back from how they have been living.  Patient does not have close ties to anyone in this area.     Lifestyle:    Patient states he is Catholic but mainly attends for holidays.  He is a social drinker and quit smoking about three years ago.  He enjoys playing golf.    Advanced Directives:   Patient has completed  both a Living Will and a Durable Power of Attorney.  SW has these documents.  He has named [name] as his surrogate.    Transplant Recovery Plan:   Patient has worked on this since meeting with Dr. Javier Docker.  He admits he did not realize the recovery process was so involved.  Patient states he does understand now.  His brothers David Watkins and David Watkins as well as their wives will come to his home after transplant, and his friend Water quality scientist.  They will be scattering their times so he does not have everyone there at once.  He stated transplant coordinator was doing a conference call with patient and family the day of the assessment.   Patient feels his family will cope as well as himself with wife being there during recovery.  Wife and two children will be gone during the holiday break to [place], where they would go yearly as a family at Christmas.       Impression:  Patient is understandably upset over his current situation.  Patient is trying to cope with not being as strong as he has been, ???walking funny??? and not being as independent.  On top of that he is having  to be in the same home as his wife who is divorcing him.  Patient needs support, the opportunity to talk about his situation, and as much choices in his treatment/routine on the unit as possible.  This can help patient feel more independent.     Plan of Care:  SW will assist with discharge and psychosocial needs.    Estimated discharge date: after transplant, without fever, when counts recover and patient is eating and drinking well  Disposition: home w/ family coming in to assist  Advanced directives on chart? Yes, 08/26/2016  Patient/family aware of discharge plans? Yes    Freddi Starr, MSW, [place]    SW welcomed patient and visitors to the unit this am.  He was getting settled so SW brought him his original advanced directives and copies.  A copy was scanned into EPIC and added to the paper-lite chart.  Patient did not have  any questions for this writer at this time.    Freddi Starr, MSW, [place]

## 2016-08-26 NOTE — Progress Notes (Signed)
RESPIRATORY THERAPY ASSESSMENT    Name:  Azure Record Number:  [id number]  Age: 60 y.o.   Gender: male  DOB: 1956-05-09  Today's Date:  08/26/2016  Room:  J3N-3512/3512-01    Assessment     Is the patient being admitted for a Respiratory related illness?  No   (If yes the patient will be seen every 4 hours for the first 24 hours and then reassessed)    Patient Admission Diagnosis      Allergies  Allergies   Allergen Reactions   ??? Pomalidomide Hives   ??? Ceclor [Cefaclor] Hives   ??? Lenalidomide Rash       Minimum Predicted Vital Capacity:     1088          Actual Vital Capacity:      2500          Pulmonary History:No history  Home Oxygen Therapy:  room air  Home Respiratory Therapy:Albuterol and Anoro Daily  Current Respiratory Therapy:  Albuterol MDI PRN, Anoro daily (patient supplied)  Treatment Type: IS       Respiratory Severity Index(RSI)   Patients with orders for inhalation medications, oxygen, or any therapeutic treatment modality will be placed on Respiratory Protocol.  They will be assessed with the first treatment and at least every 72 hours thereafter.  The following severity scale will be used to determine frequency of treatment intervention.    Smoking History: Pulmonary Disease or Smoking History, Greater than 15 pack year = 2    Social History  Social History   Substance Use Topics   ??? Smoking status: Former Smoker     Quit date: 11/25/2012   ??? Smokeless tobacco: Never Used   ??? Alcohol use Not on file       Recent Surgical History: None = 0  Past Surgical History  Past Surgical History:   Procedure Laterality Date   ??? BONE MARROW BIOPSY     ??? OTHER SURGICAL HISTORY Left 08/17/2016    trifusion cath placement       Level of Consciousness: Alert, Oriented, and Cooperative = 0    Level of Activity: Walking unassisted = 0    Respiratory Pattern: Regular Pattern; RR 8-20 = 0    Breath Sounds: Clear = 0    Sputum   ,  ,    Cough: Strong, spontaneous, non-productive = 0    Vital Signs   BP  114/65    Pulse 75    Temp 98.8 ??F (37.1 ??C) (Oral)    Resp 16    Ht 5' 8.74" (1.746 m)    Wt 201 lb 4.8 oz (91.3 kg)    SpO2 98%    BMI 29.95 kg/m??   SPO2 (COPD values may differ): Greater than or equal to 92% on room air = 0    Peak Flow (asthma only): not applicable = 0    RSI: 0-4 = See once and convert to home regimen or discontinue        Plan       Goals: medication delivery and volume expansion    Patient/caregiver was educated on the proper method of use for Respiratory Care Devices:  Yes      Level of patient/caregiver understanding able to:   [x]  Verbalize understanding   []  Demonstrate understanding       []  Teach back        []  Needs reinforcement       []   No available caregiver               []   Other:     Response to education:  Excellent     Is patient being placed on Home Treatment Regimen?  Yes     Does the patient have everything they need prior to discharge?  Yes     Comments: No distress    Plan of Care: Continue medications per home use.     Electronically signed by Chauncey Fischer, RCP on 08/26/2016 at 5:48 PM    Respiratory Protocol Guidelines     1. Assessment and treatment by Respiratory Therapy will be initiated for medication and therapeutic interventions upon initiation of aerosolized medication.  2. Physician will be contacted for respiratory rate (RR) greater than 35 breaths per minute. Therapy will be held for heart rate (HR) greater than 140 beats per minute, pending direction from physician.  3. Bronchodilators will be administered via Metered Dose Inhaler (MDI) with spacer when the following criteria are met:  a. Alert and cooperative     b. HR < 140 bpm  c. RR < 30 bpm                d. Can demonstrate a 2???3 second inspiratory hold  4. Bronchodilators will be administered via Hand Held Nebulizer [facility]) to patients when ANY of the following criteria are met  a. Incognizant or uncooperative          b. Patients treated with HHN at Home        c. Unable to demonstrate proper use of  MDI with spacer     d. RR > 30 bpm   5. Bronchodilators will be delivered via Metered Dose Inhaler (MDI), HHN, Aerogen to intubated patients on mechanical ventilation.  6. Inhalation medication orders will be delivered and/or substituted as outlined below.    Aerosolized Medications Ordering and Administration Guidelines:    1. All Medications will be ordered by a physician, and their frequency and/or modality will be adjusted as defined by the patients Respiratory Severity Index (RSI) score.  2. If the patient does not have documented COPD, consider discontinuing anticholinergics when RSI is less than 9.  3. If the bronchospasm worsens (increased RSI), then the bronchodilator frequency can be increased to a maximum of every 4 hours.  If greater than every 4 hours is required, the physician will be contacted.  4. If the bronchospasm improves, the frequency of the bronchodilator can be decreased, based on the patient's RSI, but not less than home treatment regimen frequency.  5. Bronchodilator(s) will be discontinued if patient has a RSI less than 9 and has received no scheduled or as needed treatment for 72  Hrs.    Patients Ordered on a Mucolytic Agent:    1. Must always be administered with a bronchodilator.    2. Discontinue if patient experiences worsened bronchospasm, or secretions have lessened to the point that the patient is able to clear them with a cough.    Anti-inflammatory and Combination Medications:    1. If the patient lacks prior history of lung disease, is not using inhaled anti-inflammatory medication at home, and lacks wheezing by examination or by history for at least 24 hours, contact physician for possible discontinuation.

## 2016-08-26 NOTE — Progress Notes (Signed)
Patient admitted to [facility] via direct admit from home for diagnosis of multiple myeloma.  Here to receive chemotherapy regimen melphalan.  Original chemotherapy orders reviewed and acknowledged. Appropriateness of chemotherapy treatment regimen melphalan for diagnosis of multiple myeloma was verified.  Patient educated on chemotherapy regimen.  Consent for chemotherapy obtained.      Estimated body surface area is 2.1 meters squared as calculated from the following:    Height as of this encounter: 5' 8.74" (1.746 m).    Weight as of this encounter: 201 lb 4.8 oz (91.3 kg). verified.  Appropriate dosing calculations of chemotherapy based on above height, weight, and BSA verified.      Patient and  oriented to patient room including call light and bed controls.  Admission assessment completed - see admission flowsheet documentation.  Patient is a medium fall risk.  Safety measures instituted per policy.      Patient oriented to unit policies and procedures including: pain management practices, unit safety precautions, family rapid response, q4h vital signs and assessments, daily 4am lab draws, weekly chest x-rays, weekly VRE rectal swabs for surveillance, daily chlorhexidine bathing, standing transfusion orders, and routine central line care.  Also discussed use of call light and how to get in touch with nursing staff.  Stressed the importance of calling out immediately for any changes in condition including but not limited to: pain, chills, fever, nausea, vomiting, diarrhea, chest pain, sob/doe, assistance with toileting, bleeding, or any other symptoms that are out of the ordinary for the patient.  Patient verbalizes understanding of all instructions and will call for assistance as needed.

## 2016-08-26 NOTE — Progress Notes (Signed)
Original chemotherapy orders reviewed and acknowledged. Appropriateness of chemotherapy treatment regimen Melphalan for diagnosis of MM was verified.  Patient educated on chemotherapy regimen.  Consent for chemotherapy obtained.      Estimated body surface area is 2.06 meters squared as calculated from the following:    Height as of 08/17/16: 5\' 9"  (1.753 m).    Weight as of 08/17/16: 192 lb (87.1 kg). verified.  Appropriate dosing calculations of chemotherapy based on above height, weight, and BSA verified.      Linda Grimmer Braun12/27/2017  12:48 PM

## 2016-08-26 NOTE — Progress Notes (Signed)
4 Eyes Admission Assessment     I agree as the admission nurse that 2 RN's have performed a thorough Head to Toe Skin Assessment on the patient. ALL assessment sites listed below have been assessed on admission.       Areas assessed by both nurses: Reymundo Poll  [x]    Head, Face, and Ears   [x]    Shoulders, Back, and Chest  [x]    Arms, Elbows, and Hands   [x]    Coccyx, Sacrum, and Ischum  [x]    Legs, Feet, and Heels        Does the Patient have Skin Breakdown?  No         Braden Prevention initiated:  Yes   Wound Care Orders initiated:  NA      WOC nurse consulted for Pressure Injury (Stage 3,4, Unstageable, DTI, NWPT, and Complex wounds):  NA      Nurse 1 eSignature: Electronically signed by Colan Neptune, RN on 08/26/16 at 6:11 PM      **SHARE this note so that the co-signing nurse is able to place an eSignature**    Nurse 2 eSignature: Electronically signed by Moshe Salisbury, RN on 08/26/16 at 8:10 PM

## 2016-08-26 NOTE — H&P (Addendum)
[facility] History and Physical        Attending Physician: David Salts, MD    Primary Care: David Guiles, DO       Referring MD: David Canner, MD  [address] [place] [address]    Name: David Watkins DOB:  May 03, 1956  MRN:  [id number]    Admission: 08/26/2016      Date: 08/26/2016    Reason for Admission: Melphalan preparative regimen followed by Autologous Stem Cell Transplant for IgG Kappa Multiple Myeloma    History of Present Illness:   David Watkins is a pleasant 60 yo gentleman with PMH HTN & HLD with oncologic history dating back to July 2017 when he was diagnosed with IgG Kappa Multiple Myeloma.  This diagnosis was preceded by approximately 5 months (starting in January 2017) of right sided chest pain believed to be r/t costochondritis secondary to coughing from chronic bronchitis. He eventually saw his PCP, however the pain did not improve.  He then developed pain to his right shoulder believed to be due to muscle sprain.  He was referred to orthopedist by his PCP and was prescribed steroids for management.  This initially improved his pain until July of 2017 when he developed sudden sharp right sided pain that woke him from his sleep and he presented to the ER.  He followed up with a different orthopedist the following day who sent him for CT Chest 03/10/16 at [facility] to further evaluate the ongoing, progressive pain. This revealed a 3 mm nodule in the periphery of the left lower lobe, a 3.5 x 3 cm soft tissue mass with bony destruction involving posterior elements of the T6 vertebral body with extension into the thoracic canal with at least 50% narrowing of the diameter, 2 lytic lesions within T11 vertebral body and the left pedicle with mild extent expansion in the less than 25% compromise of the thoracic canal, a 2.3 x 2.3 destructive lesion in the inferolateral aspect of the scapula, & right seventh and eighth ribs with expansile lesions in the left 2, 3, 7 and 8. With these  findings he was immediately referred to David Watkins for evaluation and management.  Total protein was elevated and he was also found to have a normocytic, normochromic anemia.  SPEP & SFLCs 03/16/16 with elevated kappa light chains & monoclonal IgG kappa on SIFE measuring 2.6gm/dL by SPEP.  IgG was elevated at 3000. He then underwent MRI 03/18/16 to further evaluate cord compression, which had to be done under general anesthesia d/t claustrophobia. This showed multiple lytic lesions and ongoing cord compression to T6 & T11 c/w MM diagnosis. Bone marrow biopsy & aspirate also obtained 03/19/16 with 25-30% plasma cells.     Decision was then made to start the pt on dexamethasone radiation tx 03/20/16.  He received Rad Tx to T5-7, T10-L3, Right Scapula - 3000 cGy with David Watkins & David Watkins 03/20/16-04/01/16.  He also started systemic RVD 04/02/16 with monthly zometa.  He unfortunately developed rash with revlimid and was switched to PVD.  He tolerated this for 3 cycles until he developed SOB and rash which was attributed to pomalyst.  The pomalyst was stopped 06/17/16 and he received 1 more cycle of therapy with Velcade & Dex.  He was then evaluated for transplant by David Watkins.  His myeloma was restaged - M-spike is now 0.4 & BM biopsy w/ 2 % plasma cells putting him in PR.  Pheresis catheter was placed  and stem cells collected.  He is now being admitted for high-dose melphalan chemotherapy followed by infusion of 2.36 x10^6 cd34cells/kg PBSCs in 27m on 08/28/16.      He is feeling well today.  Denies n/v/d/constipation.  Cont to have ongoing chronic pain - we will cont his home regimen while inpt.  Denies SOB/DOE.  Denies fever/cough/chills/sweats.  He continues to endorse anxiety re: his diagnosis and transplant process and has struggled with anxiety throughout the entirety of his treatment. We will have Dr. SJavier Dockerfollow him closely on admission and cont his PRN anti-anxiety and sleeping medicines.        Pre  Transplant Workup:      *HCT-CI adjusted to include anxiety in score - 5    Pre Transplant labs:        Past Surgical History:   Procedure Laterality Date   ??? BONE MARROW BIOPSY     ??? OTHER SURGICAL HISTORY Left 08/17/2016    trifusion cath placement       Past Medical History:   Diagnosis Date   ??? Hyperlipidemia    ??? Hypertension    ??? [place]        Prior to Admission medications    Medication Sig Start Date End Date Taking? Authorizing Provider   loratadine (CLARITIN) 10 MG capsule Take 10 mg by mouth daily   Yes Historical Provider, MD   LORazepam (ATIVAN) 1 MG tablet Take 1 mg by mouth nightly as needed for Anxiety .   Yes Historical Provider, MD   valACYclovir (VALTREX) 500 MG tablet Take 500 mg by mouth 2 times daily   Yes Historical Provider, MD   morphine (KADIAN) 30 MG extended release capsule Take 15 mg by mouth 2 times daily .   Yes Historical Provider, MD   umeclidinium-vilanterol ([facility]) 62.5-25 MCG/INH AEPB inhaler Inhale 1 puff into the lungs daily 08/06/16  Yes David Route MD   ondansetron ([facility] 8 MG tablet Take 1 tablet by mouth every 8 hours as needed for Nausea or Vomiting 04/24/16  Yes David Canner MD   oxyCODONE-acetaminophen (PERCOCET) 7.5-325 MG per tablet Take 1 tablet by mouth every 4 hours as needed for Pain . 04/06/16  Yes David Canner MD   prochlorperazine (COMPAZINE) 10 MG tablet Take 1 tablet by mouth every 6 hours as needed (nausea) 03/20/16  Yes David Kinsman NP   polyethylene glycol (GLYCOLAX) packet Take 17 g by mouth daily as needed for Constipation   Yes Historical Provider, MD   omeprazole (PRILOSEC) 20 MG delayed release capsule Take 20 mg by mouth daily   Yes Historical Provider, MD   ondansetron (ZOFRAN) 8 MG tablet Take one tablet po daily one hour prior to radiation 03/20/16  Yes David Senate MD   lisinopril (PRINIVIL;ZESTRIL) 10 MG tablet Take 10 mg by mouth daily   Yes Historical Provider, MD   atorvastatin (LIPITOR) 10 MG tablet Take 10 mg by  mouth daily   Yes Historical Provider, MD   albuterol sulfate HFA (PROAIR HFA) 108 (90 Base) MCG/ACT inhaler Inhale 2 puffs into the lungs every 6 hours as needed for Wheezing 08/06/16   David Route MD   zolpidem (AMBIEN) 5 MG tablet Take 5 mg by mouth nightly as needed for Sleep    Historical Provider, MD   eszopiclone (Johnnye Sima 2 MG TABS Take 1 tablet by mouth nightly 04/23/16   DAlain Marion MD   lenalidomide (REVLIMID) 25 MG chemo capsule  TAKE 1 CAPSULE BY MOUTH DAILY FOR 2 WEEKS ON THEN 1 WEEK OFF 04/13/16   David Canner, MD   dexamethasone (DECADRON) 4 MG tablet Take one tablet po BID beginning 03-22-16 as directed.  Patient taking differently: 2 mg 2 times daily Take one tablet po BID beginning 03-22-16 as directed. 03/20/16   Arrie Senate, MD   aspirin 81 MG EC tablet Take 81 mg by mouth daily    Historical Provider, MD       Allergies   Allergen Reactions   ??? Pomalidomide Hives   ??? Ceclor [Cefaclor] Hives   ??? Lenalidomide Rash       Family History   Problem Relation Age of Onset   ??? Heart Disease Mother    ??? Lung Cancer Father    ??? Heart Attack Brother         Social History     Social History   ??? Marital status: Married     Spouse name: N/A   ??? Number of children: N/A   ??? Years of education: N/A     Occupational History   ??? Not on file.     Social History Main Topics   ??? Smoking status: Former Smoker     Quit date: 11/25/2012   ??? Smokeless tobacco: Never Used   ??? Alcohol use Not on file   ??? Drug use: Unknown   ??? Sexual activity: Not on file     Other Topics Concern   ??? Not on file     Social History Narrative   ??? No narrative on file        ROS:  ?? Constitutional: Denies fever, sweats, weight loss.     ?? Eyes: No visual changes or diplopia. No scleral icterus.  ?? ENT: No Headaches, hearing loss or vertigo. No mouth sores or sore throat.  ?? Cardiovascular: No chest pain, dyspnea on exertion, palpitations or loss of consciousness.   ?? Respiratory: No cough or wheezing, no sputum production. No  hemoptysis.  ?? Gastrointestinal: No abdominal pain, appetite loss, blood in stools. No change in bowel habits.  ?? Genitourinary: No dysuria, trouble voiding, or hematuria.  ?? Musculoskeletal:  No generalized weakness. No joint complaints.  ?? Integumentary: No rash or pruritis.  ?? Neurological: No headache, diplopia. No change in gait, balance, or coordination. No paresthesias.  ?? Endocrine: No temperature intolerance. No excessive thirst, fluid intake, or urination.   ?? Hematologic/Lymphatic: No abnormal bruising or ecchymoses, blood clots or swollen lymph nodes.  ?? Allergic/Immunologic: No nasal congestion or hives.       Physical Exam:     Vital Signs:  BP 117/80    Pulse 80    Temp 98.5 ??F (36.9 ??C) (Oral)    Resp 16    SpO2 94%     Weight:    Wt Readings from Last 3 Encounters:   08/17/16 192 lb (87.1 kg)   08/10/16 202 lb (91.6 kg)   08/06/16 202 lb 3.2 oz (91.7 kg)       General: Awake, alert and oriented.  HEENT: normocephalic, alopecia, PERRL, no scleral erythema or icterus, Oral mucosa moist and intact, throat clear  NECK: supple without palpable adenopathy  BACK: Straight negative CVAT  SKIN: warm dry and intact without lesions rashes or masses  CHEST: CTA bilaterally without use of accessory muscles  CV: Normal S1 S2, RRR, no MRG  ABD: NT ND normoactive BS, no palpable masses or hepatosplenomegaly  EXTREMITIES: without  edema, denies calf tenderness  NEURO: CN II - XII grossly intact  CATHETER: LSC Trifusion ([place], 08/17/16) - CDI, no erythema    Laboratory Data:  CBC:   Recent Labs      08/26/16   1000   WBC  5.6   HGB  12.2*   HCT  36.5*   MCV  87.6   PLT  191     BMP/Mag:  Recent Labs      08/26/16   1000   NA  140   K  4.4   CL  103   CO2  25   PHOS  3.5   BUN  13   CREATININE  0.8*   MG  1.90     LIVP:   Recent Labs      08/26/16   1000   AST  25   ALT  19   BILIDIR  <0.2   BILITOT  0.4   ALKPHOS  125     Coags:   Recent Labs      08/26/16   1000   PROTIME  12.1   INR  1.07   APTT  26.9     Uric  Acid   Recent Labs      08/26/16   1000   LABURIC  4.4       PROBLEM LIST: ????????   ????  Multiple myeloma  Hyperlipidemia  HTN  Neoplasm related pain  ????  TREATMENT: ????????   ????  1. Rad Tx to T5-7, T10-L3, Right Scapula - 3000 cGy - David Watkins 03/20/16-04/01/16  2. RVD x1 03/20/16 - discontinued d/t rash   3. Velcade/Pomalyst/Dex x3 cycles 04/23/16-06/17/16 - discontinued d/t reaction to pomalyst  4. Velcade/Dex x 1 cycle 06/26/16 (last dose of dexamethasone 07/22/16  5. High-dose melphalan followed by administration of PBSCs 2.36 x10^6 cd34cells/kg on 08/28/16    ASSESSMENT AND PLAN: ????????   ????  1.  IgG kappa multiple myeloma: PR after 5 cycles chemotherapy - M-spike is 0.4 & BM biopsy 2 % plasma cells  - Admitted for melphalan 252m/m2 preparative regimen followed by administration of PBSCs 2.36 x10^6 cd34cells/kg on 08/28/16.    Dr. EHunt Orishas discussed the risks associated with transplant which include the risk of infection, bleeding and inability to obtain a long term remission. He understands these risks and is willing to proceed.  The consent is signed and on the chart.      Day - 2    2.  ID:  Afebrile, no evidence of infection.    - Begin Valtrex, cont acyclovir at discharge for VZV positive titer.    - Start Diflucan prophylaxis on 08/28/16   - Start Levaquin [place] 1[place]    3.  Heme:  Blood counts stable  - Transfuse for Hgb < 7 and Platelets < 10K  - No transfusion today    4.  Metabolic:  Electrolytes are WNL and renal fxn stable.    - Start IVF hydration: D51/2NS w/10KCl @ 1512mhr   - Replace potassium and magnesium per policy.    5. GI/Nutrition:  Appetite and oral intake is good.   - Start low microbial diet   - Follow closely with dietary  - GERD: Cont PPI  - Constipation: Miralax PRN    6. Pain: Chronic back & rib pain s/p radtx  - Continue 30 mg MS contin BID with Oxycodone 7.5 PRN for breakthrough  - Cont naprosyn q12h  - Follows with pain  specialist as outpt  - Recent PET/CT did not suggest any active  disease.  If required we will get a dedicated LS spine series.  He has claustrophobia and cannot tolerate an MRI.    7. 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 Chronic Bronchitis/COPD: As evidenced by pre-transplant PFTs - FEV1 is 43%, He did respond to bronchodilators and went up to 52%  - Cont albuterol PRN  - Cont anoro ellipta inhaler  - Cleared for transplant by Dr. Percell Miller    9. Cardiac: h/o HLD. 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 Cleared for BMT by Dr. Eliane Decree    10. Anxiety/Depression  - Ativan PRN  - Dr. Javier Watkins following    11. Insomnia:  - Ativan PRN at HS      - DVT Prophylaxis: Platelets >50,000 cells/dL, - daily lovenox prophylaxis ordered  Contraindications to pharmacologic prophylaxis: None  Contraindications to mechanical prophylaxis: None    - Disposition:  Will be discharged after transplant when blood counts have recovered and he is afebrile and eating and drinking well.     The patient was seen and examined by Dr. Youlanda Roys.  This admission history and physical has been discussed and agreed upon by Dr. Youlanda Roys.    Loma Newton, CNP

## 2016-08-26 NOTE — Care Coordination-Inpatient (Signed)
Type of Admission  Multiple Myeloma IgG  Melphalan Auto ( T:0: 08/28/16)  Day -2        Central venous catheter  Left SC Trifusion Catheter ( 08/17/16, Dr. Lizbeth Bark)        Plan  Proceed with Melphalan Auto SCT for treatment of Multiple Myeloma        Update            Education  08/26/16: Introduced myself in Clinician/Naviagtor role. Discussed use of cryotherapy with Melphalan, verbalized understanding.  Reviewed timeline of recovery, states that he has a calendar that his brothers & sisters are assisting post discharge, all if his siblings live out of town.  Confirmed local Ph[facility]        Discharge  When [place] recovers, afebrile & adequate nutrition        Pending

## 2016-08-26 NOTE — Other (Signed)
08/26/16 1131   Encounter Summary   Services provided to: Patient and family together   Referral/Consult From: Rounding   Continue Visiting (es 12/27)   Complexity of Encounter Moderate   Length of Encounter 15 minutes   Routine   Type Initial   Assessment Approachable   Intervention Active listening;Prayer;Contacted support as requested per patient/family request   Who? Fr. dale   Why? pt wants to give confession   At Request Of patient   Outcome Engaged in conversation;Expressed feelings/needs/concerns;Receptive   Patient is Catholic, but he has not practiced in a long time.  Someone gave him a Mudlogger with a saint who heals cancer and a rosary.  He was very touched by this, and he wants to reconnect with his faith.  He requested confession with Fr. Quita Skye.  I contacted Fr. Quita Skye on his behalf.  Will follow with goal of supporting patient as he returns to his faith.

## 2016-08-26 NOTE — Plan of Care (Signed)
Problem: Infection - Central Venous Catheter-Associated Bloodstream Infection:  Goal: Will show no infection signs and symptoms  Will show no infection signs and symptoms   Outcome: Met This Shift  CVC site remains free of signs/symptoms of infection. No drainage, edema, erythema, pain, or warmth noted at site. Dressing changes continue per protocol and on an as needed basis - see flowsheet.     Compliant with BCC Bath Protocol:  Performed CHG bath today per BCC protocol utilizing CHG solution in the shower.  CVC site cleansed with CHG wipe over dressing, skin surrounding dressing, and first 6" of IV tubing.  Pt tolerated well.  Continued to encourage daily CHG bathing per [facility] protocol.      Problem: PROTECTIVE PRECAUTIONS  Goal: Patient will remain free of nosocomial Infections  Outcome: Met This Shift  Pt remains in protective precautions for an ANC of 4.2. Pt educated on wearing mask when in hallways. Pt, staff, and visitors adhering to handwashing guidelines. Pt showers daily with chlorhexidine and linens changed daily per protocol. Pt verbalizes understanding of low microbial diet. Will continue to monitor.     Problem: Pain:  Goal: Pain level will decrease  Pain level will decrease   Outcome: Met This Shift  Pt. Given oxycodone this shift for multiple myeloma pain related pain in abdominal area. Oncoming RN aware and will reassess pain.    Problem: Falls - Risk of:  Goal: Will remain free from falls  Will remain free from falls   Outcome: Met This Shift  Orthostatic vital signs obtained at start of shift - see flowsheet for details.  Pt does not meet criteria for orthostasis.  Pt is a Med fall risk. See Leamon Arnt Fall Score and ABCDS Injury Risk assessments.     - Screening for Orthostasis AND not a High Falls Risk per MORSE/ABCDS: Pt bed is in low position, side rails up, call light and belongings are in reach.  Fall risk light is on outside pts room.  Pt encouraged to call for assistance as needed. Will continue  with hourly rounds for PO intake, pain needs, toileting and repositioning as needed.     Problem: Falls - Risk of  Goal: Absence of falls  Outcome: Met This Shift

## 2016-08-26 NOTE — Other (Signed)
08/26/16 1519   Encounter Summary   Services provided to: Patient   Volunteer Visit (fr dale 12/27)   Sacraments   Reconciliation (made confession)   This was a high complexity encounter, during which Fr. Quita Skye took confession

## 2016-08-27 LAB — BASIC METABOLIC PANEL
Anion Gap: 11 (ref 3–16)
BUN: 10 mg/dL (ref 7–20)
CO2: 24 mmol/L (ref 21–32)
Calcium: 8.6 mg/dL (ref 8.3–10.6)
Chloride: 103 mmol/L (ref 99–110)
Creatinine: 0.7 mg/dL — ABNORMAL LOW (ref 0.9–1.3)
GFR African American: >60 (ref >60)
GFR Non-African American: >60 (ref >60)
Glucose: 220 mg/dL — ABNORMAL HIGH (ref 70–99)
Potassium: 4.5 mmol/L (ref 3.5–5.1)
Sodium: 138 mmol/L (ref 136–145)

## 2016-08-27 LAB — CBC WITH AUTO DIFFERENTIAL
Basophils %: 0.1 %
Basophils Absolute: 0 10*3/uL (ref 0.0–0.2)
Eosinophils %: 0 %
Eosinophils Absolute: 0 10*3/uL (ref 0.0–0.6)
Hematocrit: 34.4 % — ABNORMAL LOW (ref 40.5–52.5)
Hemoglobin: 11.4 g/dL — ABNORMAL LOW (ref 13.5–17.5)
Lymphocytes %: 6.7 %
Lymphocytes Absolute: 0.3 10*3/uL — ABNORMAL LOW (ref 1.0–5.1)
MCH: 29.3 pg (ref 26.0–34.0)
MCHC: 33.2 g/dL (ref 31.0–36.0)
MCV: 88.3 fL (ref 80.0–100.0)
MPV: 7.2 fL (ref 5.0–10.5)
Monocytes %: 1.3 %
Monocytes Absolute: 0.1 10*3/uL (ref 0.0–1.3)
Neutrophils %: 91.9 %
Neutrophils Absolute: 4 10*3/uL (ref 1.7–7.7)
Platelets: 205 10*3/uL (ref 135–450)
RBC: 3.9 M/uL — ABNORMAL LOW (ref 4.20–5.90)
RDW: 13.4 % (ref 12.4–15.4)
WBC: 4.3 10*3/uL (ref 4.0–11.0)

## 2016-08-27 LAB — PROTIME-INR
INR: 1.08 (ref 0.85–1.15)
Protime: 12.2 s (ref 9.6–13.0)

## 2016-08-27 LAB — APTT: aPTT: 30.5 s (ref 24.1–34.9)

## 2016-08-27 LAB — MAGNESIUM: Magnesium: 1.7 mg/dL — ABNORMAL LOW (ref 1.80–2.40)

## 2016-08-27 LAB — URIC ACID: Uric Acid, Serum: 3.7 mg/dL (ref 3.5–7.2)

## 2016-08-27 MED ORDER — SERTRALINE HCL 50 MG PO TABS
50 MG | Freq: Every day | ORAL | Status: DC
Start: 2016-08-27 — End: 2016-08-29
  Administered 2016-08-27 – 2016-08-29 (×3): 50 mg via ORAL

## 2016-08-27 MED ORDER — SODIUM CHLORIDE 0.9 % IV SOLN
0.9 % | INTRAVENOUS | Status: AC
Start: 2016-08-27 — End: 2016-08-27

## 2016-08-27 MED FILL — MELPHALAN HCL 50 MG IV SOLR: 50 MG | INTRAVENOUS | Qty: 40

## 2016-08-27 MED FILL — SERTRALINE HCL 50 MG PO TABS: 50 MG | ORAL | Qty: 1

## 2016-08-27 MED FILL — ONDANSETRON HCL 8 MG PO TABS: 8 MG | ORAL | Qty: 3

## 2016-08-27 MED FILL — MORPHINE SULFATE ER 15 MG PO TBCR: 15 mg | ORAL | Qty: 1

## 2016-08-27 MED FILL — LISINOPRIL 10 MG PO TABS: 10 MG | ORAL | Qty: 1

## 2016-08-27 MED FILL — LOVENOX 40 MG/0.4ML SC SOLN: 40 MG/0.4ML | SUBCUTANEOUS | Qty: 0.4

## 2016-08-27 MED FILL — KCL IN DEXTROSE-NACL 10-5-0.45 MEQ/L-%-% IV SOLN: INTRAVENOUS | Qty: 1000

## 2016-08-27 MED FILL — LORAZEPAM 1 MG PO TABS: 1 MG | ORAL | Qty: 1

## 2016-08-27 MED FILL — OXYCODONE HCL 15 MG PO TABS: 15 MG | ORAL | Qty: 1

## 2016-08-27 MED FILL — CETIRIZINE HCL 10 MG PO TABS: 10 MG | ORAL | Qty: 1

## 2016-08-27 MED FILL — SODIUM CHLORIDE 0.9 % IV SOLN: 0.9 % | INTRAVENOUS | Qty: 500

## 2016-08-27 MED FILL — SODIUM CHLORIDE 0.9 % IV SOLN: 0.9 % | INTRAVENOUS | Qty: 1000

## 2016-08-27 MED FILL — VALACYCLOVIR HCL 500 MG PO TABS: 500 MG | ORAL | Qty: 1

## 2016-08-27 MED FILL — PANTOPRAZOLE SODIUM 40 MG PO TBEC: 40 MG | ORAL | Qty: 1

## 2016-08-27 MED FILL — PROCHLORPERAZINE MALEATE 10 MG PO TABS: 10 MG | ORAL | Qty: 1

## 2016-08-27 MED FILL — DEXAMETHASONE 4 MG PO TABS: 4 MG | ORAL | Qty: 3

## 2016-08-27 MED FILL — FUROSEMIDE 10 MG/ML IJ SOLN: 10 MG/ML | INTRAMUSCULAR | Qty: 4

## 2016-08-27 NOTE — Plan of Care (Signed)
Problem: Infection - Central Venous Catheter-Associated Bloodstream Infection:  Goal: Will show no infection signs and symptoms  Will show no infection signs and symptoms   Outcome: Ongoing  CVC site remains free of signs/symptoms of infection. No drainage, edema, erythema, pain, or warmth noted at site. Dressing changes continue per protocol and on an as needed basis - see flowsheet.         Problem: PROTECTIVE PRECAUTIONS  Goal: Patient will remain free of nosocomial Infections  Outcome: Ongoing  Pt remains in a private room. Pt is compliant with handwashing, low-microbial diet, and bathing per BCC protocol. Pt remains afebrile at this time with stable vital signs. Will continue to monitor    Problem: Pain:  Goal: Pain level will decrease  Pain level will decrease   Outcome: Ongoing  Pt complains of abdominal pain "5-7/10" throughout shift. PRN and scheduled pain medications administered per order- see eMAR. Pt tolerates well and does express relief of pain post administration. Pt resting in bed with RR >10. Will continue to monitor.    Problem: Falls - Risk of:  Goal: Will remain free from falls  Will remain free from falls   Outcome: Ongoing  Orthostatic vital signs obtained at start of shift - see flowsheet for details.  Pt does not meet criteria for orthostasis.  Pt is a Med fall risk. See Leamon Arnt Fall Score and ABCDS Injury Risk assessments.   - Screening for Orthostasis AND not a High Falls Risk per MORSE/ABCDS: Pt bed is in low position, side rails up, call light and belongings are in reach.  Fall risk light is on outside pts room.  Pt encouraged to call for assistance as needed. Will continue with hourly rounds for PO intake, pain needs, toileting and repositioning as needed.

## 2016-08-27 NOTE — Plan of Care (Signed)
Problem: Nausea/Vomiting:  Goal: Absence of nausea/vomiting  Absence of nausea/vomiting  Outcome: Ongoing  Pt c/o nausea, antiemetic administered as ordered, will monitor

## 2016-08-27 NOTE — Plan of Care (Signed)
Problem: Infection - Central Venous Catheter-Associated Bloodstream Infection:  Goal: Will show no infection signs and symptoms  Will show no infection signs and symptoms   Outcome: Ongoing  CVC site remains free of signs/symptoms of infection. No drainage, edema, erythema, pain, or warmth noted at site. Dressing changes continue per protocol and on an as needed basis - see flowsheet.         Problem: PROTECTIVE PRECAUTIONS  Goal: Patient will remain free of nosocomial Infections  Outcome: Ongoing  Pt showing no S/S of infection; Protective precautions followed this shift     Problem: Pain:  Goal: Pain level will decrease  Pain level will decrease   Outcome: Ongoing  Pt c/o pain in ribcage, sched pain med administered, upon reassessment pt stated pain 2 out of 10, pt satisfied at this time    Problem: Falls - Risk of:  Goal: Will remain free from falls  Will remain free from falls   Outcome: Ongoing    - Screening for Orthostasis AND not a High Falls Risk per MORSE/ABCDS: Pt bed is in low position, side rails up, call light and belongings are in reach.  Fall risk light is on outside pts room.  Pt encouraged to call for assistance as needed. Will continue with hourly rounds for PO intake, pain needs, toileting and repositioning as needed.

## 2016-08-27 NOTE — Consults (Signed)
Nutrition Assessment    Type and Reason for Visit: Initial, Consult    Malnutrition Assessment:  ?? Malnutrition Status: Insufficient data (pt unavailable, but malnutrition not suspected d/t wt stable)    Nutrition Diagnosis:   ?? Problem: No nutrition diagnosis at this time    Nutrition Assessment:  ?? Subjective Assessment: Pt admited for Auto transplant, Pt w/staff x2 and unable to visit. Pt appears wt steady and is eating 75-100% of meals so far. Will continue to monitor for pt availability for dietary interview and diet ed.     Nutrition Risk Level  ??? Risk Level: Low    Nutrition Intervention  Food and/or Delivery: Continue current diet, Start ONS  Nutrition Education/Counseling/Coordination of Care:  Continued Inpatient Monitoring, Education Needed (needs Low microbial diet ed prior to d/c)    Patient assessed for nutrition risk.  Deemed to be at low risk at this time.  Will continue to follow patient.      Electronically signed by Arville Care, RD, LD on 08/27/16 at 11:49 AM    Contact Number: [phone]

## 2016-08-27 NOTE — Oncology Nurse Navigation (Signed)
Patient received melphalan today per chemotherapy preparative regimen as ordered.  Prior to initiating melphalan infusion, patient was educated on the use of cryotherapy to prevent/decrease severe oral mucositis.  Patient verbalized understanding of procedure and performed cryotherapy as instructed for 15 minutes prior to infusion, throughout duration of infusion, and for 2 hours post infusion using ice chips and popsicles  Pt tolerated well with no significant side effects.

## 2016-08-27 NOTE — Oncology Nurse Navigation (Signed)
Administration: Chemotherapy drug Melphalan independently verified with Garlon Hatchet RN prior to administration.  Acknowledgement of informed consent for chemotherapy administration verified.  Original order, appropriateness of regimen, drug supplied, height, weight, BSA, dose calculations, expiration dates/times, drug appearance, and two patient identifiers were verified by both RNs.  Drug checked for vesicant/irritant status and for risk of hypersensitivity.  Most recent laboratory values and allergies, were reviewed.  Positive, brisk blood return via CVC was confirmed prior to administration. Chest x-ray for correct line placement reviewed. Brendolyn Patty and Garlon Hatchet RN verified correct rate of chemotherapy and maintenance IV fluids.  Patient was educated on chemotherapy regimen prior to administration including indication for treatment related to disease & side effects of chemotherapy drug.  Patient verbalizes understanding of all instructions.    Completion of Chemotherapy: Monitoring during infusion done per policy, see Flowsheets.  Blood return verified before, during, and after infusion per policy; no signs of extravasation.  Pt tolerated chemotherapy well and without incident.  Chemotherapy infusion end time on the [facility].  Will continue to monitor.

## 2016-08-27 NOTE — Behavioral Health Treatment Team (Signed)
Met with pt who has been very anxious since admission yesterday.  Pt denies anxiety or concerns when spoken of them directly but verbalizes his expectation that he always gets the worst outcomes, the worst luck, the "small print" side effects of everything.  His sons are out of town and he is lonely.  He is happy his illness and need for care has brought his brothers and aunt closer to him and they will be helping out once he is discharged.  Will follow closely.   Manley Fason, Psy.D., ABPP

## 2016-08-27 NOTE — Progress Notes (Signed)
[name]  Autologous Progress Note    08/27/2016    David Watkins    DOB:  [birth year]    MRN:  [id number]    Referring MD: Eulis Canner, MD  [address] [place] [address]    Subjective:  Tolerating chemo well.     ECOG PS:  (1) Restricted in physically strenuous activity, ambulatory and able to do work of light nature    Isolation:  None     Medications    Scheduled Meds:  ??? enoxaparin  40 mg Subcutaneous Daily   ??? [START ON 08/28/2016] fluconazole  400 mg Oral Daily   ??? Saline Mouthwash  15 mL Swish & Spit 4x Daily AC & HS   ??? sodium chloride flush  10 mL Intravenous 2 times per day   ??? valACYclovir  500 mg Oral BID   ??? furosemide  40 mg Intravenous Q12H   ??? ondansetron  24 mg Oral Once   ??? dexamethasone  12 mg Oral Once   ??? melphalan  200 mg Intravenous Once   ??? umeclidinium-vilanterol  1 puff Inhalation Daily   ??? cetirizine  10 mg Oral Daily   ??? lisinopril  10 mg Oral Daily   ??? morphine  15 mg Oral BID   ??? pantoprazole  40 mg Oral QAM AC     Continuous Infusions:  ??? sodium chloride     ??? dextrose 5% and 0.45% NaCl with KCl 10 mEq 150 mL/hr at 08/27/16 0339     PRN Meds:.sodium chloride, alteplase, magnesium hydroxide, magnesium sulfate, potassium chloride, Saline Mouthwash, prochlorperazine **OR** prochlorperazine, LORazepam **OR** LORazepam, albuterol sulfate HFA, polyethylene glycol, oxyCODONE, LORazepam    ROS:  ?? Constitutional: Denies fever, sweats, weight loss. Chronic right shoulder & rib pain  ?? Eyes: No visual changes or diplopia. No scleral icterus.  ?? ENT: No Headaches, hearing loss or vertigo. No mouth sores or sore throat.  ?? Cardiovascular: No chest pain, dyspnea on exertion, palpitations or loss of consciousness.   ?? Respiratory: No cough or wheezing, no sputum production. No hemoptysis.  ?? Gastrointestinal: No abdominal pain, appetite loss, blood in stools. No change in bowel habits.  ?? Genitourinary: No dysuria, trouble voiding, or hematuria.  ?? Musculoskeletal:  No  generalized weakness. No joint complaints.  ?? Integumentary: No rash or pruritis.  ?? Neurological: No headache, diplopia. No change in gait, balance, or coordination. No paresthesias.  ?? Endocrine: No temperature intolerance. No excessive thirst, fluid intake, or urination.   ?? Hematologic/Lymphatic: No abnormal bruising or ecchymoses, blood clots or swollen lymph nodes.  ?? Allergic/Immunologic: No nasal congestion or hives.     Physical Exam:     I&O:    Intake/Output Summary (Last 24 hours) at 08/27/16 0811  Last data filed at 08/27/16 5852   Gross per 24 hour   Intake             3150 ml   Output             1575 ml   Net             1575 ml       Vital Signs:  BP 137/83    Pulse 76    Temp 97.6 ??F (36.4 ??C) (Oral)    Resp 16    Ht 5' 8.74" (1.746 m)    Wt 201 lb 4.8 oz (91.3 kg)    SpO2 94%    BMI 29.95 kg/m??  Weight:    Wt Readings from Last 3 Encounters:   08/26/16 201 lb 4.8 oz (91.3 kg)   08/17/16 192 lb (87.1 kg)   08/10/16 202 lb (91.6 kg)       General: Awake, alert and oriented ??  HEENT: normocephalic, alopecia, PERRL, no scleral erythema or icterus, Oral mucosa moist and intact, throat clear.   NECK: supple without palpable adenopathy  BACK: Straight, negative CVAT  SKIN: warm dry and intact without lesions rashes or masses  CHEST: CTA bilaterally without use of accessory muscles  CV: Normal S1 S2, RRR, no MRG  ABD: NT ND normoactive BS, no palpable masses or hepatosplenomegaly  EXTREMITIES: without edema, denies calf tenderness  NEURO: CN II - XII grossly intact  CATHETER: LSC Trifusion (Barrat, 08/17/16) - CDI, no erythema    Data:   CBC: Recent Labs      08/26/16   1000  08/27/16   0340   WBC  5.6  4.3   HGB  12.2*  11.4*   HCT  36.5*  34.4*   MCV  87.6  88.3   PLT  191  205     BMP/Mag:  Recent Labs      08/26/16   1000  08/27/16   0340   NA  140  138   K  4.4  4.5   CL  103  103   CO2  25  24   PHOS  3.5   --    BUN  13  10   CREATININE  0.8*  0.7*   MG  1.90  1.70*     LIVP:   Recent Labs       08/26/16   1000   AST  25   ALT  19   BILIDIR  <0.2   BILITOT  0.4   ALKPHOS  125     Uric Acid:    Recent Labs      08/27/16   0340   LABURIC  3.7     Coags:   Recent Labs      08/26/16   1000  08/27/16   0340   PROTIME  12.1  12.2   INR  1.07  1.08   APTT  26.9  30.5     PROBLEM LIST: ????????   ????  Multiple myeloma  Hyperlipidemia  HTN  Neoplasm related pain  ????  TREATMENT: ????????   ????  1. Rad Tx to T5-7, T10-L3, Right Scapula - 3000 cGy - Dr. Jamas Lav 03/20/16-04/01/16  2. RVD x1 03/20/16 - discontinued d/t rash   3. Velcade/Pomalyst/Dex x3 cycles 04/23/16-06/17/16 - discontinued d/t reaction to pomalyst  4. Velcade/Dex x 1 cycle 06/26/16 (last dose of dexamethasone 07/22/16  5. High-dose melphalan followed by administration of PBSCs 2.36 x10^6 cd34cells/kg on 08/28/16  ??  ASSESSMENT AND PLAN: ????????   ????  1.  IgG kappa multiple myeloma: PR after 5 cycles chemotherapy - M-spike is 0.4 & BM biopsy 2 % plasma cells  - Admitted for melphalan 242m/m2 preparative regimen followed by administration of PBSCs 2.36 x10^6 cd34cells/kg on 08/28/16. He is NOT an early release candidate d/t his lack of social support and high anxiety    Day - 1  ??  2.  ID:  Afebrile, no evidence of infection.    - Cont ppx Valtrex, cont acyclovir at discharge for VZV positive titer.    - Start Diflucan prophylaxis on 08/28/16   - Start Levaquin when ACapitanejo< 1.5  ??  3.  Heme:  Mild anemia from previous chemotherapy & ongoing disease  - Transfuse for Hgb < 7 and Platelets < 10K  - No transfusion today  ??  4.  Metabolic:  Electrolytes are WNL and renal fxn stable.  Hyperglycemia from dex  - Start IVF hydration: D51/2NS w/10KCl @ 142m/hr   - Replace potassium and magnesium per policy  - Monitor BG on AM labs  ??  5. GI/Nutrition:  Appetite and oral intake is good.   - Cont low microbial diet   - Follow closely with dietary  - GERD: Cont PPI  - Constipation: Miralax PRN  ??  6. 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  If required we will get a dedicated LS spine series.  He has claustrophobia and cannot tolerate an MRI.  ??  7. 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  8. Chronic Bronchitis/COPD: As evidenced by pre-transplant PFTs - FEV1 is 43%, He did respond to bronchodilators and went up to 52%  - Cont albuterol PRN  - Cont anoro ellipta inhaler  - Cleared for transplant by Dr. MPercell Miller ??  9. Cardiac: h/o HLD. 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 - Cleared for BMT by Dr. WEliane Decree ??  10. Anxiety/Depression  - Ativan PRN  - Dr. SJavier Dockerconsulted  ??  11. Insomnia:  - Ativan PRN at HS  ??  ??  - DVT Prophylaxis: Platelets >50,000 cells/dL, - daily lovenox prophylaxis ordered  Contraindications to pharmacologic prophylaxis: None  Contraindications to mechanical prophylaxis: None  ??  - Disposition:  Will be discharged after transplant when blood counts have recovered and he is afebrile and eating and drinking well.   ??    BLoma Newton CNP  JHarlene Salts MD  [facility]  [phone]

## 2016-08-28 LAB — CBC WITH AUTO DIFFERENTIAL
Basophils %: 0.1 %
Basophils Absolute: 0 10*3/uL (ref 0.0–0.2)
Eosinophils %: 0 %
Eosinophils Absolute: 0 10*3/uL (ref 0.0–0.6)
Hematocrit: 31 % — ABNORMAL LOW (ref 40.5–52.5)
Hemoglobin: 10.5 g/dL — ABNORMAL LOW (ref 13.5–17.5)
Lymphocytes %: 2.1 %
Lymphocytes Absolute: 0.1 10*3/uL — ABNORMAL LOW (ref 1.0–5.1)
MCH: 29.5 pg (ref 26.0–34.0)
MCHC: 33.8 g/dL (ref 31.0–36.0)
MCV: 87.3 fL (ref 80.0–100.0)
MPV: 6.9 fL (ref 5.0–10.5)
Monocytes %: 6.6 %
Monocytes Absolute: 0.3 10*3/uL (ref 0.0–1.3)
Neutrophils %: 91.2 %
Neutrophils Absolute: 3.9 10*3/uL (ref 1.7–7.7)
Platelets: 225 10*3/uL (ref 135–450)
RBC: 3.55 M/uL — ABNORMAL LOW (ref 4.20–5.90)
RDW: 13.5 % (ref 12.4–15.4)
WBC: 4.2 10*3/uL (ref 4.0–11.0)

## 2016-08-28 LAB — BASIC METABOLIC PANEL
Anion Gap: 11 (ref 3–16)
BUN: 15 mg/dL (ref 7–20)
CO2: 24 mmol/L (ref 21–32)
Calcium: 8.2 mg/dL — ABNORMAL LOW (ref 8.3–10.6)
Chloride: 100 mmol/L (ref 99–110)
Creatinine: 0.8 mg/dL — ABNORMAL LOW (ref 0.9–1.3)
GFR African American: >60 (ref >60)
GFR Non-African American: >60 (ref >60)
Glucose: 171 mg/dL — ABNORMAL HIGH (ref 70–99)
Potassium: 4.5 mmol/L (ref 3.5–5.1)
Sodium: 135 mmol/L — ABNORMAL LOW (ref 136–145)

## 2016-08-28 LAB — HEPATIC FUNCTION PANEL
ALT: 34 U/L (ref 10–40)
AST: 33 U/L (ref 15–37)
Albumin: 3.7 g/dL (ref 3.4–5.0)
Alkaline Phosphatase: 94 U/L (ref 40–129)
Bilirubin, Direct: <0.2 mg/dL (ref 0.0–0.3)
Total Bilirubin: 0.3 mg/dL (ref 0.0–1.0)
Total Protein: 5.9 g/dL — ABNORMAL LOW (ref 6.4–8.2)

## 2016-08-28 LAB — LACTATE DEHYDROGENASE: LD: 156 U/L (ref 100–190)

## 2016-08-28 LAB — URIC ACID: Uric Acid, Serum: 5.1 mg/dL (ref 3.5–7.2)

## 2016-08-28 LAB — CULTURE, VRE: VRE Culture: NEGATIVE

## 2016-08-28 LAB — PHOSPHORUS: Phosphorus: 3.3 mg/dL (ref 2.5–4.9)

## 2016-08-28 MED ORDER — HYDROCORTISONE NA SUCCINATE PF 100 MG IJ SOLR
100 | Freq: Once | INTRAMUSCULAR | Status: AC
Start: 2016-08-28 — End: 2016-08-28
  Administered 2016-08-28: 16:00:00 100 mg via INTRAVENOUS

## 2016-08-28 MED ORDER — MORPHINE SULFATE 2 MG/ML IJ SOLN
2 | INTRAMUSCULAR | Status: DC | PRN
Start: 2016-08-28 — End: 2016-09-01

## 2016-08-28 MED ORDER — SODIUM CHLORIDE 0.9% IV SOLN 250 ML
Status: AC | PRN
Start: 2016-08-28 — End: 2016-08-29
  Administered 2016-08-28: 17:00:00 250 mL via INTRAVENOUS

## 2016-08-28 MED ORDER — LORAZEPAM 1 MG PO TABS
1 | Freq: Once | ORAL | Status: AC | PRN
Start: 2016-08-28 — End: 2016-08-28
  Administered 2016-08-28: 16:00:00 1 mg via ORAL

## 2016-08-28 MED ORDER — DIPHENHYDRAMINE HCL 25 MG PO TABS
25 | Freq: Once | ORAL | Status: AC
Start: 2016-08-28 — End: 2016-08-28
  Administered 2016-08-28: 16:00:00 25 mg via ORAL

## 2016-08-28 MED ORDER — EPINEPHRINE PF 1 MG/ML IJ SOLN
1 | Freq: Once | INTRAMUSCULAR | Status: AC | PRN
Start: 2016-08-28 — End: 2016-08-28

## 2016-08-28 MED ORDER — SODIUM CHLORIDE 0.9 % IV SOLN
0.9 | INTRAVENOUS | Status: DC | PRN
Start: 2016-08-28 — End: 2016-09-16

## 2016-08-28 MED ORDER — HYDROCORTISONE NA SUCCINATE PF 100 MG IJ SOLR
100 | Freq: Once | INTRAMUSCULAR | Status: AC | PRN
Start: 2016-08-28 — End: 2016-08-28

## 2016-08-28 MED ORDER — DIPHENHYDRAMINE HCL 50 MG/ML IJ SOLN
50 | INTRAMUSCULAR | Status: AC | PRN
Start: 2016-08-28 — End: 2016-08-29

## 2016-08-28 MED ORDER — SODIUM CHLORIDE 0.9 % IV SOLN
0.9 % | INTRAVENOUS | Status: AC
Start: 2016-08-28 — End: 2016-08-28
  Administered 2016-08-28: 17:00:00 250

## 2016-08-28 MED ORDER — TBO-FILGRASTIM 300 MCG/0.5ML SC SOSY
300 MCG/0.5ML | Freq: Every evening | SUBCUTANEOUS | Status: DC
Start: 2016-08-28 — End: 2016-09-12
  Administered 2016-09-02 – 2016-09-11 (×10): 300 ug via SUBCUTANEOUS

## 2016-08-28 MED ORDER — ACETAMINOPHEN 325 MG PO TABS
325 | Freq: Once | ORAL | Status: AC
Start: 2016-08-28 — End: 2016-08-28
  Administered 2016-08-28: 16:00:00 650 mg via ORAL

## 2016-08-28 MED FILL — PANTOPRAZOLE SODIUM 40 MG PO TBEC: 40 MG | ORAL | Qty: 1

## 2016-08-28 MED FILL — ACETAMINOPHEN 325 MG PO TABS: 325 MG | ORAL | Qty: 2

## 2016-08-28 MED FILL — VALACYCLOVIR HCL 500 MG PO TABS: 500 MG | ORAL | Qty: 1

## 2016-08-28 MED FILL — LORAZEPAM 1 MG PO TABS: 1 MG | ORAL | Qty: 1

## 2016-08-28 MED FILL — SODIUM CHLORIDE 0.9 % IR SOLN: 0.9 % | Qty: 15

## 2016-08-28 MED FILL — DIPHENHYDRAMINE HCL 50 MG/ML IJ SOLN: 50 MG/ML | INTRAMUSCULAR | Qty: 1

## 2016-08-28 MED FILL — EPINEPHRINE PF 1 MG/ML IJ SOLN: 1 MG/ML | INTRAMUSCULAR | Qty: 1

## 2016-08-28 MED FILL — SOLU-CORTEF 100 MG IJ SOLR: 100 MG | INTRAMUSCULAR | Qty: 4

## 2016-08-28 MED FILL — DIPHENHYDRAMINE HCL 25 MG PO TABS: 25 MG | ORAL | Qty: 1

## 2016-08-28 MED FILL — PROCHLORPERAZINE MALEATE 10 MG PO TABS: 10 MG | ORAL | Qty: 1

## 2016-08-28 MED FILL — KCL IN DEXTROSE-NACL 10-5-0.45 MEQ/L-%-% IV SOLN: INTRAVENOUS | Qty: 1000

## 2016-08-28 MED FILL — CETIRIZINE HCL 10 MG PO TABS: 10 MG | ORAL | Qty: 1

## 2016-08-28 MED FILL — OXYCODONE HCL 15 MG PO TABS: 15 MG | ORAL | Qty: 1

## 2016-08-28 MED FILL — MORPHINE SULFATE ER 15 MG PO TBCR: 15 mg | ORAL | Qty: 1

## 2016-08-28 MED FILL — FLUCONAZOLE 200 MG PO TABS: 200 MG | ORAL | Qty: 2

## 2016-08-28 MED FILL — LOVENOX 40 MG/0.4ML SC SOLN: 40 MG/0.4ML | SUBCUTANEOUS | Qty: 0.4

## 2016-08-28 MED FILL — SODIUM CHLORIDE 0.9 % IV SOLN: 0.9 % | INTRAVENOUS | Qty: 750

## 2016-08-28 MED FILL — FUROSEMIDE 10 MG/ML IJ SOLN: 10 MG/ML | INTRAMUSCULAR | Qty: 4

## 2016-08-28 MED FILL — LISINOPRIL 10 MG PO TABS: 10 MG | ORAL | Qty: 1

## 2016-08-28 MED FILL — SERTRALINE HCL 50 MG PO TABS: 50 MG | ORAL | Qty: 1

## 2016-08-28 NOTE — Other (Signed)
1st bag stem cells up

## 2016-08-28 NOTE — Plan of Care (Signed)
Problem: Infection - Central Venous Catheter-Associated Bloodstream Infection:  Goal: Will show no infection signs and symptoms  Will show no infection signs and symptoms   Outcome: Ongoing  CVC site remains free of signs/symptoms of infection. No drainage, edema, erythema, pain, or warmth noted at site. Dressing changes continue per protocol and on an as needed basis - see flowsheet.     Compliant with [facility] Bath Protocol:  Performed CHG bath today per BCC protocol utilizing CHG solution in the shower.  CVC site cleansed with CHG wipe over dressing, skin surrounding dressing, and first 6" of IV tubing.  Pt tolerated well.  Continued to encourage daily CHG bathing per [facility] protocol.        Problem: PROTECTIVE PRECAUTIONS  Goal: Patient will remain free of nosocomial Infections  Outcome: Ongoing  Protective precautions in place. Will reinforce precautions with patient    Problem: Pain:  Goal: Pain level will decrease  Pain level will decrease   Outcome: Ongoing  Complains of vague lower abd pain this am and requested pain med. Patient verbalized relief. Mild cramping toward end of transplant. Patient stated cramping was tolerable. No further complaints,    Problem: Falls - Risk of:  Goal: Will remain free from falls  Will remain free from falls   Outcome: Ongoing  Orthostatic vital signs obtained at start of shift - see flowsheet for details.  Pt does not meet criteria for orthostasis.  Pt is a Med fall risk. See Leamon Arnt Fall Score and ABCDS Injury Risk assessments.      - Screening for Orthostasis AND not a High Falls Risk per MORSE/ABCDS: Pt bed is in low position, side rails up, call light and belongings are in reach.  Fall risk light is on outside pts room.  Pt encouraged to call for assistance as needed. Will continue with hourly rounds for PO intake, pain needs, toileting and repositioning as needed.

## 2016-08-28 NOTE — Plan of Care (Signed)
Problem: Infection - Central Venous Catheter-Associated Bloodstream Infection:  Goal: Will show no infection signs and symptoms  Will show no infection signs and symptoms   Outcome: Ongoing  CVC site remains free of signs/symptoms of infection. No drainage, edema, erythema, pain, or warmth noted at site. Dressing changes continue per protocol and on an as needed basis - see flowsheet.   ??  ??  ??  Problem: PROTECTIVE PRECAUTIONS  Goal: Patient will remain free of nosocomial Infections  Outcome: Ongoing  Pt remains in a private room. Pt is compliant with handwashing, low-microbial diet, and bathing per BCC protocol. Pt remains afebrile at this time with stable vital signs. Will continue to monitor  ??  Problem: Pain:  Goal: Pain level will decrease  Pain level will decrease   Outcome: Ongoing  Pt complained of abdominal pain once this shift. PRN and scheduled pain medications administered per order- see eMAR. Pt tolerates well and does express relief of pain post administration. Pt resting in bed with RR >10. Will continue to monitor.  ??  Problem: Falls - Risk of:  Goal: Will remain free from falls  Will remain free from falls   Outcome: Ongoing  Orthostatic vital signs obtained at start of shift - see flowsheet for details.  Pt does not meet criteria for orthostasis.  Pt is a Med fall risk. See Leamon Arnt Fall Score and ABCDS Injury Risk assessments.   - Screening for Orthostasis AND not a High Falls Risk per MORSE/ABCDS: Pt bed is in low position, side rails up, call light and belongings are in reach.  Fall risk light is on outside pts room.  Pt encouraged to call for assistance as needed. Will continue with hourly rounds for PO intake, pain needs, toileting and repositioning as needed.   ??  ??

## 2016-08-28 NOTE — Other (Signed)
2nd bag up

## 2016-08-28 NOTE — Progress Notes (Signed)
[place]  Autologous Progress Note    08/28/2016    David Watkins    DOB:  1956-03-09    MRN:  [id number]    Referring MD: Eulis Canner, MD  [address] [place] [address]    Subjective:  Tolerated chemo well.     ECOG PS:  (1) Restricted in physically strenuous activity, ambulatory and able to do work of light nature    Isolation:  None     Medications    Scheduled Meds:  ??? hydrocortisone sodium succinate PF  100 mg Intravenous Once   ??? diphenhydrAMINE  25 mg Oral Once   ??? acetaminophen  650 mg Oral Once   ??? sertraline  50 mg Oral Daily   ??? enoxaparin  40 mg Subcutaneous Daily   ??? fluconazole  400 mg Oral Daily   ??? Saline Mouthwash  15 mL Swish & Spit 4x Daily AC & HS   ??? sodium chloride flush  10 mL Intravenous 2 times per day   ??? valACYclovir  500 mg Oral BID   ??? furosemide  40 mg Intravenous Q12H   ??? umeclidinium-vilanterol  1 puff Inhalation Daily   ??? cetirizine  10 mg Oral Daily   ??? lisinopril  10 mg Oral Daily   ??? morphine  15 mg Oral BID   ??? pantoprazole  40 mg Oral QAM AC     Continuous Infusions:  ??? sodium chloride     ??? sodium chloride     ??? dextrose 5% and 0.45% NaCl with KCl 10 mEq 150 mL/hr at 08/28/16 0650     PRN Meds:.diphenhydrAMINE, hydrocortisone sodium succinate PF, EPINEPHrine, morphine, sodium chloride, sodium chloride, LORazepam, sodium chloride, alteplase, magnesium hydroxide, magnesium sulfate, potassium chloride, Saline Mouthwash, prochlorperazine **OR** prochlorperazine, LORazepam **OR** LORazepam, albuterol sulfate HFA, polyethylene glycol, oxyCODONE, LORazepam    ROS:  ?? Constitutional: Denies fever, sweats, weight loss. Chronic right shoulder & rib pain  ?? Eyes: No visual changes or diplopia. No scleral icterus.  ?? ENT: No Headaches, hearing loss or vertigo. No mouth sores or sore throat.  ?? Cardiovascular: No chest pain, dyspnea on exertion, palpitations or loss of consciousness.   ?? Respiratory: No cough or wheezing, no sputum production. No  hemoptysis.  ?? Gastrointestinal: No abdominal pain, appetite loss, blood in stools. No change in bowel habits.  ?? Genitourinary: No dysuria, trouble voiding, or hematuria.  ?? Musculoskeletal:  No generalized weakness. No joint complaints.  ?? Integumentary: No rash or pruritis.  ?? Neurological: No headache, diplopia. No change in gait, balance, or coordination. No paresthesias.  ?? Endocrine: No temperature intolerance. No excessive thirst, fluid intake, or urination.   ?? Hematologic/Lymphatic: No abnormal bruising or ecchymoses, blood clots or swollen lymph nodes.  ?? Allergic/Immunologic: No nasal congestion or hives.     Physical Exam:     I&O:      Intake/Output Summary (Last 24 hours) at 08/28/16 0824  Last data filed at 08/28/16 0715   Gross per 24 hour   Intake             4682 ml   Output             3950 ml   Net              732 ml       Vital Signs:  BP 114/77    Pulse 82    Temp 98.3 ??F (36.8 ??C) (Oral)  Resp 16    Ht 5' 8.74" (1.746 m)    Wt 198 lb 6.4 oz (90 kg)    SpO2 95%    BMI 29.52 kg/m??     Weight:    Wt Readings from Last 3 Encounters:   08/27/16 198 lb 6.4 oz (90 kg)   08/17/16 192 lb (87.1 kg)   08/10/16 202 lb (91.6 kg)       General: Awake, alert and oriented ??  HEENT: normocephalic, alopecia, PERRL, no scleral erythema or icterus, Oral mucosa moist and intact, throat clear.   NECK: supple without palpable adenopathy  BACK: Straight, negative CVAT  SKIN: warm dry and intact without lesions rashes or masses  CHEST: CTA bilaterally without use of accessory muscles  CV: Normal S1 S2, RRR, no MRG  ABD: NT ND normoactive BS, no palpable masses or hepatosplenomegaly  EXTREMITIES: without edema, denies calf tenderness  NEURO: CN II - XII grossly intact  CATHETER: LSC Trifusion (Barrat, 08/17/16) - CDI, no erythema    Data:   CBC:   Recent Labs      08/26/16   1000  08/27/16   0340  08/28/16   0305   WBC  5.6  4.3  4.2   HGB  12.2*  11.4*  10.5*   HCT  36.5*  34.4*  31.0*   MCV  87.6  88.3  87.3    PLT  191  205  225     BMP/Mag:  Recent Labs      08/26/16   1000  08/27/16   0340  08/28/16   0305   NA  140  138  135*   K  4.4  4.5  4.5   CL  103  103  100   CO2  25  24  24    PHOS  3.5   --   3.3   BUN  13  10  15    CREATININE  0.8*  0.7*  0.8*   MG  1.90  1.70*   --      LIVP:   Recent Labs      08/26/16   1000  08/28/16   0305   AST  25  33   ALT  19  34   BILIDIR  <0.2  <0.2   BILITOT  0.4  0.3   ALKPHOS  125  94     Uric Acid:    Recent Labs      08/28/16   0305   LABURIC  5.1     Coags:   Recent Labs      08/26/16   1000  08/27/16   0340   PROTIME  12.1  12.2   INR  1.07  1.08   APTT  26.9  30.5     PROBLEM LIST: ????????   ????  Multiple myeloma  Hyperlipidemia  HTN  Neoplasm related pain  ????  TREATMENT: ????????   ????  1. Rad Tx to T5-7, T10-L3, Right Scapula - 3000 cGy - Dr. Jamas Lav 03/20/16-04/01/16  2. RVD x1 03/20/16 - discontinued d/t rash   3. Velcade/Pomalyst/Dex x3 cycles 04/23/16-06/17/16 - discontinued d/t reaction to pomalyst  4. Velcade/Dex x 1 cycle 06/26/16 (last dose of dexamethasone 07/22/16  5. High-dose melphalan followed by administration of PBSCs 2.36 x10^6 cd34cells/kg on 08/28/16  ??  ASSESSMENT AND PLAN: ????????   ????  1.  IgG kappa multiple myeloma: PR after 5 cycles chemotherapy - M-spike is 0.4 & BM  biopsy 2 % plasma cells  - Admitted for melphalan 267m/m2 preparative regimen followed by administration of PBSCs 2.36 x10^6 cd34cells/kg on 08/28/16. He is NOT an early release candidate d/t his lack of social support and high anxiety    Day 0  ??  2.  ID:  Afebrile, no evidence of infection.    - Cont ppx Valtrex, cont acyclovir at discharge for VZV positive titer.    - Start Diflucan prophylaxis on 08/28/16   - Start Levaquin when A[place]abeck< 1.5  ??  3.  Heme:  Mild anemia from previous chemotherapy & ongoing disease  - Transfuse for Hgb < 7 and Platelets < 10K  - No transfusion today  ??  4.  Metabolic:  Electrolytes are WNL and renal fxn stable.  Hyperglycemia from dex improving  - Start IVF hydration:  D51/2NS w/10KCl @ 1512mhr   - Replace potassium and magnesium per policy  - Monitor BG on AM labs  ??  5. GI/Nutrition:  Appetite and oral intake is good.   - Cont low microbial diet   - Follow closely with dietary  - GERD: Cont PPI  - Constipation: Miralax PRN  ??  6. Pain: Chronic back & rib pain s/p radtx  - Continue 30 mg MS contin BID with Oxycodone 7.5 PRN for breakthrough  - Follows with pain specialist as outpt  - Recent PET/CT did not suggest any active disease.  If required we will get a dedicated LS spine series.  He has claustrophobia and cannot tolerate an MRI.  ??  7. 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  8. Chronic Bronchitis/COPD: As evidenced by pre-transplant PFTs - FEV1 is 43%, He did respond to bronchodilators and went up to 52%  - Cont albuterol PRN  - Cont anoro ellipta inhaler  - Cleared for transplant by Dr. MuPercell Miller??  9. Cardiac: h/o HLD. 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 - Cleared for BMT by Dr. WhEliane Decree??  10. Anxiety/Depression  - Ativan PRN  - Dr. SoJavier Dockeronsulted  ??  11. Insomnia:  - Ativan PRN at HS  ??  ??  - DVT Prophylaxis: Platelets >50,000 cells/dL, - daily lovenox prophylaxis ordered  Contraindications to pharmacologic prophylaxis: None  Contraindications to mechanical prophylaxis: None  ??  - Disposition:  Will be discharged after transplant when blood counts have recovered and he is afebrile and eating and drinking well.   ??    JaHarlene SaltsMD  JaHarlene SaltsMD  [facility]  [phone]

## 2016-08-28 NOTE — Procedures (Signed)
Transplant (T0) Progress Note      David Watkins                           Blood Type: O pos                               Start time:1134 Completion time 1301    Transplant Type: Autologous    Product Type: PBSC (peripheral blood stem cell)    Product Unit Number: [id number]  [id number] B0  [id number]  [id number] B0    Cell Count: 2.4  x 10^6  Volume: 280 mL      Product Manipulation:      Volume Reduction  No  Plasma Depletion  No  RBC Depletion  No    Catheter lumen used for infusion: red             Positive Blood Return: Yes    Donor Name: David Watkins                                     Blood Type: O pos    Relationship: self                          Donor Sex: Male    Premeds Given: Tylenol, benadryl, solucortef    Adverse Reaction(s) and treatment: Baseline vital signs obtained prior to infusion and monitoring completed throughout per [facility] protocol - see flowsheets. All IVFs turned down to [facility] during stem cell infusion. Stem cell product(s) verified with 2nd RN Selena Lesser prior to infusion using 2 patient identifiers. Infused via gravity with rate controlled by Lenice Llamas, RN per [facility] protocols.    Emergency medications epinephrine, benadryl, & hydrocortisone available as needed. Emergency medical equipment available as needed including telemetry monitoring throughout infusion, supplemental O2, suction equipment, and crash cart. RN at bedside throughout infusion.   Lenice Llamas

## 2016-08-28 NOTE — Behavioral Health Treatment Team (Signed)
Psychology Progress Note    Patient: David Watkins  Pt had transplant today and continues to be in bed, saying he feels very tired.  He has not eaten at all today.  Encouraged eating, up in chair, showering daily, engaging in activity as much as possible.  Reminded pt that he is weakening himself and making himself more vulnerable to problems if he continues to wait for a good appetite to eat and for energy to move around.  Encouraged him to control what he can.  Will continue to follow after weekend.   Majid Mccravy, Psy.D., ABPP

## 2016-08-28 NOTE — Plan of Care (Signed)
Problem: Infection - Central Venous Catheter-Associated Bloodstream Infection:  Goal: Will show no infection signs and symptoms  Will show no infection signs and symptoms   Outcome: Ongoing  CVC site remains free of signs/symptoms of infection. No drainage, edema, erythema, pain, or warmth noted at site. Dressing changes continue per protocol and on an as needed basis - see flowsheet.     Compliant with BCC Bath Protocol:  Performed CHG bath today per BCC protocol utilizing CHG solution in the shower.  CVC site cleansed with CHG wipe over dressing, skin surrounding dressing, and first 6" of IV tubing.  Pt tolerated well.  Continued to encourage daily CHG bathing per [facility] protocol.        Problem: PROTECTIVE PRECAUTIONS  Goal: Patient will remain free of nosocomial Infections  Outcome: Ongoing  Pt remains in a private room. Pt is compliant with handwashing, low-microbial diet, and bathing per BCC protocol. Pt remains afebrile at this time with stable vital signs. Will continue to monitor.    Problem: Pain:  Goal: Pain level will decrease  Pain level will decrease   Outcome: Ongoing  Pt complains of chronic back/abdominal pain. PRN and scheduled pain medications administered per order-see eMAR. Pt tolerates well and expresses relief of pain post administration. Will continue to monitor.    Problem: Falls - Risk of:  Goal: Will remain free from falls  Will remain free from falls   Outcome: Ongoing  Orthostatic vital signs obtained at start of shift - see flowsheet for details.  Pt does not meet criteria for orthostasis.  Pt is a Med fall risk. See Leamon Arnt Fall Score and ABCDS Injury Risk assessments.   - Screening for Orthostasis AND not a High Falls Risk per MORSE/ABCDS: Pt bed is in low position, side rails up, call light and belongings are in reach.  Fall risk light is on outside pts room.  Pt encouraged to call for assistance as needed. Will continue with hourly rounds for PO intake, pain needs, toileting and  repositioning as needed.     Problem: Nausea/Vomiting:  Goal: Absence of nausea/vomiting  Absence of nausea/vomiting   Outcome: Ongoing  Pt complained of nausea once so far this shift. PRN compazine administered per order- see eMAR. Pt expressed relief of nausea post administration. Will continue to monitor.

## 2016-08-28 NOTE — Other (Signed)
4th bag up

## 2016-08-28 NOTE — Other (Signed)
3rd bag up

## 2016-08-29 LAB — CBC WITH AUTO DIFFERENTIAL
Basophils %: 0.2 %
Basophils Absolute: 0 10*3/uL (ref 0.0–0.2)
Eosinophils %: 0.1 %
Eosinophils Absolute: 0 10*3/uL (ref 0.0–0.6)
Hematocrit: 29.7 % — ABNORMAL LOW (ref 40.5–52.5)
Hemoglobin: 10 g/dL — ABNORMAL LOW (ref 13.5–17.5)
Lymphocytes %: 2 %
Lymphocytes Absolute: 0.1 10*3/uL — ABNORMAL LOW (ref 1.0–5.1)
MCH: 29.5 pg (ref 26.0–34.0)
MCHC: 33.8 g/dL (ref 31.0–36.0)
MCV: 87.1 fL (ref 80.0–100.0)
MPV: 6.7 fL (ref 5.0–10.5)
Monocytes %: 4.2 %
Monocytes Absolute: 0.1 10*3/uL (ref 0.0–1.3)
Neutrophils %: 93.5 %
Neutrophils Absolute: 3 10*3/uL (ref 1.7–7.7)
Platelets: 181 10*3/uL (ref 135–450)
RBC: 3.41 M/uL — ABNORMAL LOW (ref 4.20–5.90)
RDW: 13.9 % (ref 12.4–15.4)
WBC: 3.2 10*3/uL — ABNORMAL LOW (ref 4.0–11.0)

## 2016-08-29 LAB — BASIC METABOLIC PANEL
Anion Gap: 12 (ref 3–16)
BUN: 16 mg/dL (ref 7–20)
CO2: 25 mmol/L (ref 21–32)
Calcium: 7.6 mg/dL — ABNORMAL LOW (ref 8.3–10.6)
Chloride: 99 mmol/L (ref 99–110)
Creatinine: 0.7 mg/dL — ABNORMAL LOW (ref 0.9–1.3)
GFR African American: >60 (ref >60)
GFR Non-African American: >60 (ref >60)
Glucose: 117 mg/dL — ABNORMAL HIGH (ref 70–99)
Potassium: 3.8 mmol/L (ref 3.5–5.1)
Sodium: 136 mmol/L (ref 136–145)

## 2016-08-29 LAB — URIC ACID: Uric Acid, Serum: 4.7 mg/dL (ref 3.5–7.2)

## 2016-08-29 MED ORDER — SERTRALINE HCL 50 MG PO TABS
50 MG | Freq: Every day | ORAL | Status: DC
Start: 2016-08-29 — End: 2016-08-29

## 2016-08-29 MED ORDER — SERTRALINE HCL 50 MG PO TABS
50 MG | Freq: Every day | ORAL | Status: DC
Start: 2016-08-29 — End: 2016-09-16
  Administered 2016-08-31 – 2016-09-16 (×17): 50 mg via ORAL

## 2016-08-29 MED FILL — KCL IN DEXTROSE-NACL 10-5-0.45 MEQ/L-%-% IV SOLN: INTRAVENOUS | Qty: 1000

## 2016-08-29 MED FILL — VALACYCLOVIR HCL 500 MG PO TABS: 500 MG | ORAL | Qty: 1

## 2016-08-29 MED FILL — FUROSEMIDE 10 MG/ML IJ SOLN: 10 MG/ML | INTRAMUSCULAR | Qty: 4

## 2016-08-29 MED FILL — OXYCODONE HCL 15 MG PO TABS: 15 MG | ORAL | Qty: 1

## 2016-08-29 MED FILL — PANTOPRAZOLE SODIUM 40 MG PO TBEC: 40 MG | ORAL | Qty: 1

## 2016-08-29 MED FILL — PROCHLORPERAZINE MALEATE 10 MG PO TABS: 10 MG | ORAL | Qty: 1

## 2016-08-29 MED FILL — MORPHINE SULFATE ER 15 MG PO TBCR: 15 mg | ORAL | Qty: 1

## 2016-08-29 MED FILL — CETIRIZINE HCL 10 MG PO TABS: 10 MG | ORAL | Qty: 1

## 2016-08-29 MED FILL — FLUCONAZOLE 200 MG PO TABS: 200 MG | ORAL | Qty: 2

## 2016-08-29 MED FILL — LOVENOX 40 MG/0.4ML SC SOLN: 40 MG/0.4ML | SUBCUTANEOUS | Qty: 0.4

## 2016-08-29 MED FILL — LORAZEPAM 1 MG PO TABS: 1 MG | ORAL | Qty: 1

## 2016-08-29 MED FILL — SERTRALINE HCL 50 MG PO TABS: 50 MG | ORAL | Qty: 1

## 2016-08-29 MED FILL — LISINOPRIL 10 MG PO TABS: 10 MG | ORAL | Qty: 1

## 2016-08-29 NOTE — Progress Notes (Signed)
[place]  Autologous Progress Note    08/29/2016    STACIE KNUTZEN    DOB:  [birth year]    MRN:  [id number]    Referring MD: Eulis Canner, MD  [address] [place] [address]    Subjective:  Tolerated chemo well.     ECOG PS:  (1) Restricted in physically strenuous activity, ambulatory and able to do work of light nature    Isolation:  None     Medications    Scheduled Meds:  ??? [START ON 09/02/2016] Tbo-Filgrastim  300 mcg Subcutaneous QPM   ??? sertraline  50 mg Oral Daily   ??? enoxaparin  40 mg Subcutaneous Daily   ??? fluconazole  400 mg Oral Daily   ??? Saline Mouthwash  15 mL Swish & Spit 4x Daily AC & HS   ??? sodium chloride flush  10 mL Intravenous 2 times per day   ??? valACYclovir  500 mg Oral BID   ??? furosemide  40 mg Intravenous Q12H   ??? umeclidinium-vilanterol  1 puff Inhalation Daily   ??? cetirizine  10 mg Oral Daily   ??? lisinopril  10 mg Oral Daily   ??? morphine  15 mg Oral BID   ??? pantoprazole  40 mg Oral QAM AC     Continuous Infusions:  ??? sodium chloride     ??? sodium chloride     ??? dextrose 5% and 0.45% NaCl with KCl 10 mEq 100 mL/hr at 08/29/16 0732     PRN Meds:.morphine, sodium chloride, sodium chloride, sodium chloride, alteplase, magnesium hydroxide, magnesium sulfate, potassium chloride, Saline Mouthwash, prochlorperazine **OR** prochlorperazine, LORazepam **OR** LORazepam, albuterol sulfate HFA, polyethylene glycol, oxyCODONE, LORazepam    ROS:  ?? Constitutional: Denies fever, sweats, weight loss. Chronic right shoulder & rib pain  ?? Eyes: No visual changes or diplopia. No scleral icterus.  ?? ENT: No Headaches, hearing loss or vertigo. No mouth sores or sore throat.  ?? Cardiovascular: No chest pain, dyspnea on exertion, palpitations or loss of consciousness.   ?? Respiratory: No cough or wheezing, no sputum production. No hemoptysis.  ?? Gastrointestinal: No abdominal pain, appetite loss, blood in stools. No change in bowel habits.  ?? Genitourinary: No dysuria, trouble voiding, or  hematuria.  ?? Musculoskeletal:  No generalized weakness. No joint complaints.  ?? Integumentary: No rash or pruritis.  ?? Neurological: No headache, diplopia. No change in gait, balance, or coordination. No paresthesias.  ?? Endocrine: No temperature intolerance. No excessive thirst, fluid intake, or urination.   ?? Hematologic/Lymphatic: No abnormal bruising or ecchymoses, blood clots or swollen lymph nodes.  ?? Allergic/Immunologic: No nasal congestion or hives.     Physical Exam:     I&O:      Intake/Output Summary (Last 24 hours) at 08/29/16 1246  Last data filed at 08/29/16 1227   Gross per 24 hour   Intake             5398 ml   Output             3550 ml   Net             1848 ml       Vital Signs:  BP 102/65    Pulse 76    Temp 98.4 ??F (36.9 ??C) (Oral)    Resp 18    Ht 5' 8.74" (1.746 m)    Wt 201 lb 4.8 oz (91.3 kg)    SpO2 96%  BMI 29.95 kg/m??     Weight:    Wt Readings from Last 3 Encounters:   08/29/16 201 lb 4.8 oz (91.3 kg)   08/17/16 192 lb (87.1 kg)   08/10/16 202 lb (91.6 kg)       General: Awake, alert and oriented ??  HEENT: normocephalic, alopecia, PERRL, no scleral erythema or icterus, Oral mucosa moist and intact, throat clear.   NECK: supple without palpable adenopathy  BACK: Straight, negative CVAT  SKIN: warm dry and intact without lesions rashes or masses  CHEST: CTA bilaterally without use of accessory muscles  CV: Normal S1 S2, RRR, no MRG  ABD: NT ND normoactive BS, no palpable masses or hepatosplenomegaly  EXTREMITIES: without edema, denies calf tenderness  NEURO: CN II - XII grossly intact  CATHETER: LSC Trifusion ([place], 08/17/16) - CDI, no erythema    Data:   CBC:   Recent Labs      08/27/16   0340  08/28/16   0305  08/29/16   0400   WBC  4.3  4.2  3.2*   HGB  11.4*  10.5*  10.0*   HCT  34.4*  31.0*  29.7*   MCV  88.3  87.3  87.1   PLT  205  225  181     BMP/Mag:  Recent Labs      08/27/16   0340  08/28/16   0305  08/29/16   0400   NA  138  135*  136   K  4.5  4.5  3.8   CL  103  100  99    CO2  _0 PHOS   --   3.3   --    BUN  _1 CREATININE  0.7*  0.8*  0.7*   MG  1.70*   --    --      LIVP:   Recent Labs      08/28/16   0305   AST  33   ALT  34   BILIDIR  <0.2   BILITOT  0.3   ALKPHOS  94     Uric Acid:    Recent Labs      08/29/16   0400   LABURIC  4.7     Coags:   Recent Labs      08/27/16   0340   PROTIME  12.2   INR  1.08   APTT  30.5     PROBLEM LIST: ????????   ????  Multiple myeloma  Hyperlipidemia  HTN  Neoplasm related pain  ????  TREATMENT: ????????   ????  1. Rad Tx to T5-7, T10-L3, Right Scapula - 3000 cGy - Dr. Jamas Lav 03/20/16-04/01/16  2. RVD x1 03/20/16 - discontinued d/t rash   3. Velcade/Pomalyst/Dex x3 cycles 04/23/16-06/17/16 - discontinued d/t reaction to pomalyst  4. Velcade/Dex x 1 cycle 06/26/16 (last dose of dexamethasone 07/22/16  5. High-dose melphalan followed by administration of PBSCs 2.36 x10^6 cd34cells/kg on 08/28/16  ??  ASSESSMENT AND PLAN: ????????   ????  1.  IgG kappa multiple myeloma: PR after 5 cycles chemotherapy - M-spike is 0.4 & BM biopsy 2 % plasma cells  - Admitted for melphalan 225m/m2 preparative regimen followed by administration of PBSCs 2.36 x10^6 cd34cells/kg on 08/28/16. He is NOT an early release candidate d/t his lack of social support and high anxiety    Day +1  ??  2.  ID:  Afebrile, no evidence of infection.    - Cont ppx Valtrex, cont acyclovir at discharge for VZV positive titer.    - Start Diflucan prophylaxis on 08/28/16   - Start Levaquin when [place] < 1.5  ??  3.  Heme:  Mild anemia from previous chemotherapy & ongoing disease  - Transfuse for Hgb < 7 and Platelets < 10K  - No transfusion today  ??  4.  Metabolic:  Electrolytes are WNL and renal fxn stable.  Hyperglycemia from dex improving  - Start IVF hydration: D51/2NS w/10KCl @ 174m/hr   - Replace potassium and magnesium per policy  - Monitor BG on AM labs  ??  5. GI/Nutrition:  Appetite and oral intake is good.   - Cont low microbial diet   - Follow closely with dietary  - GERD: Cont PPI  -  Constipation: Miralax PRN  ??  6. Pain: Chronic back & rib pain s/p radtx  - Continue 30 mg MS contin BID with Oxycodone 7.5 PRN for breakthrough  - Follows with pain specialist as outpt  - Recent PET/CT did not suggest any active disease.  If required we will get a dedicated LS spine series.  He has claustrophobia and cannot tolerate an MRI.  ??  7. 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  8. Chronic Bronchitis/COPD: As evidenced by pre-transplant PFTs - FEV1 is 43%, He did respond to bronchodilators and went up to 52%  - Cont albuterol PRN  - Cont anoro ellipta inhaler  - Cleared for transplant by Dr. MPercell Miller ??  9. Cardiac: h/o HLD. 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 - Cleared for BMT by Dr. WEliane Decree ??  10. Anxiety/Depression  - Ativan PRN  - Dr. SJavier Dockerconsulted  - zoloft ordered, causing fatigue; will try at night  ??  11. Insomnia:  - Ativan PRN at HS  ??  ??      - DVT Prophylaxis: Platelets >50,000 cells/dL, - daily lovenox prophylaxis ordered  Contraindications to pharmacologic prophylaxis: None  Contraindications to mechanical prophylaxis: None  ??  - Disposition:  Will be discharged after transplant when blood counts have recovered and he is afebrile and eating and drinking well.   ??    Brandon Scarbrough A. FDrucilla Schmidt DO, MS

## 2016-08-29 NOTE — Plan of Care (Signed)
Problem: Infection - Central Venous Catheter-Associated Bloodstream Infection:  Goal: Will show no infection signs and symptoms  Will show no infection signs and symptoms   Outcome: Ongoing  Pt afebrile. Left Trifusion in place; site and dressing remain c/d/i. Lines flush well with good blood return; Tegaderm and Biopatch in place. Lines pinned per protocol. Will continue to monitor.  CVC site remains free of signs/symptoms of infection. No drainage, edema, erythema, pain, or warmth noted at site. Dressing changes continue per protocol and on an as needed basis - see flowsheet.     Compliant with BCC Bath Protocol:  Performed CHG bath today per BCC protocol utilizing CHG solution in the shower.  CVC site cleansed with CHG wipe over dressing, skin surrounding dressing, and first 6" of IV tubing.  Pt tolerated well.  Continued to encourage daily CHG bathing per [facility] protocol.    Problem: PROTECTIVE PRECAUTIONS  Goal: Patient will remain free of nosocomial Infections  Outcome: Ongoing  Pt remains in neutropenic precautions per floor policy. Pt, visitors, and staff noted to be following precautions appropriately. Handwashing in place; pt wearing mask in hallway per protocol. Pt in private room. Low microbial diet in place. Will continue to monitor.    Problem: Pain:  Goal: Pain level will decrease  Pain level will decrease   Outcome: Ongoing  Pt continues with chronic back pain. Scheduled MS Contin and PRN Oxycodone administered per eMar. Pt repositioning and walking hallways for relief as well. Will continue to monitor.    Problem: Falls - Risk of:  Goal: Will remain free from falls  Will remain free from falls   Outcome: Ongoing  Pt remains free of falls; up ad lib with steady gait. Patient's BP was orthostatic negative this shift. Bed in lowest position, wheels locked, side rails up 2/4. Possessions and call light within reach; pt uses call light appropriately. Will continue to monitor.    Stable/No Isolation  Precautions:  Pt with activity orders for up ad lib.  Encouraged pt to be up OOB as much as possible throughout the day and for all meals.  Encouraged frequent short naps as necessary to preserve energy but instructed that while awake, pt should be OOB.  Encouraged pt to ambulate in halls.  Pt seen up ad lib in halls several times this shift. Pt is visualized to be OOB 26-50% of the time this shift.  Will continue to encourage frequent activity.    Problem: Nausea/Vomiting:  Goal: Absence of nausea/vomiting  Absence of nausea/vomiting   Outcome: Ongoing  Pt continues with nausea this shift, no emesis noted. PRN Compazine administered per eMar. Will continue to monitor.    Problem: Nutrition Deficit:  Goal: Ability to achieve adequate nutritional intake will improve  Ability to achieve adequate nutritional intake will improve   Outcome: Ongoing  Pt with decreased appetite r/t nausea. Pt educated about nutrition options and Ensure ordered per pt's request. Will continue to monitor.    Problem: Bleeding:  Goal: Will show no signs and symptoms of excessive bleeding  Will show no signs and symptoms of excessive bleeding   Outcome: Ongoing  Patient's hemoglobin this AM:   Recent Labs      08/29/16   0400   HGB  10.0*     Patient's platelet count this AM:   Recent Labs      08/29/16   0400   PLT  181    Thrombocytopenia Precautions in place.  Patient showing no signs or symptoms  of active bleeding.  Transfusion not indicated at this time.  Patient verbalizes understanding of all instructions. Will continue to assess and implement POC. Call light within reach and hourly rounding in place.     Problem: Venous Thromboembolism:  Goal: Will show no signs or symptoms of venous thromboembolism  Will show no signs or symptoms of venous thromboembolism   Outcome: Ongoing  Adherent with DVT Prevention: Pt is at risk for DVT d/t decreased mobility and cancer treatment.  Pt educated on importance of activity.  Pt has orders for Subcut  prophylactic lovenox.  Pt verbalizes understanding of need for prophylaxis while inpatient.

## 2016-08-30 LAB — CBC WITH AUTO DIFFERENTIAL
Basophils %: 0.1 %
Basophils Absolute: 0 10*3/uL (ref 0.0–0.2)
Eosinophils %: 0.1 %
Eosinophils Absolute: 0 10*3/uL (ref 0.0–0.6)
Hematocrit: 32.4 % — ABNORMAL LOW (ref 40.5–52.5)
Hemoglobin: 10.8 g/dL — ABNORMAL LOW (ref 13.5–17.5)
Lymphocytes %: 1.8 %
Lymphocytes Absolute: 0 10*3/uL — ABNORMAL LOW (ref 1.0–5.1)
MCH: 29.2 pg (ref 26.0–34.0)
MCHC: 33.2 g/dL (ref 31.0–36.0)
MCV: 88 fL (ref 80.0–100.0)
MPV: 6.8 fL (ref 5.0–10.5)
Monocytes %: 2.3 %
Monocytes Absolute: 0.1 10*3/uL (ref 0.0–1.3)
Neutrophils %: 95.7 %
Neutrophils Absolute: 2.6 10*3/uL (ref 1.7–7.7)
Platelets: 204 10*3/uL (ref 135–450)
RBC: 3.68 M/uL — ABNORMAL LOW (ref 4.20–5.90)
RDW: 13.7 % (ref 12.4–15.4)
WBC: 2.7 10*3/uL — ABNORMAL LOW (ref 4.0–11.0)

## 2016-08-30 LAB — BASIC METABOLIC PANEL
Anion Gap: 11 (ref 3–16)
BUN: 15 mg/dL (ref 7–20)
CO2: 27 mmol/L (ref 21–32)
Calcium: 8 mg/dL — ABNORMAL LOW (ref 8.3–10.6)
Chloride: 99 mmol/L (ref 99–110)
Creatinine: 0.6 mg/dL — ABNORMAL LOW (ref 0.9–1.3)
GFR African American: >60 (ref >60)
GFR Non-African American: >60 (ref >60)
Glucose: 112 mg/dL — ABNORMAL HIGH (ref 70–99)
Potassium: 3.9 mmol/L (ref 3.5–5.1)
Sodium: 137 mmol/L (ref 136–145)

## 2016-08-30 LAB — URIC ACID: Uric Acid, Serum: 4.7 mg/dL (ref 3.5–7.2)

## 2016-08-30 MED ORDER — BACLOFEN 10 MG PO TABS
10 MG | Freq: Three times a day (TID) | ORAL | Status: DC | PRN
Start: 2016-08-30 — End: 2016-08-31
  Administered 2016-08-30 – 2016-08-31 (×3): 20 mg via ORAL

## 2016-08-30 MED ORDER — MORPHINE SULFATE 15 MG PO TABS
15 MG | ORAL | Status: DC | PRN
Start: 2016-08-30 — End: 2016-09-16
  Administered 2016-08-30 – 2016-09-16 (×43): 15 mg via ORAL

## 2016-08-30 MED FILL — PROCHLORPERAZINE MALEATE 10 MG PO TABS: 10 MG | ORAL | Qty: 1

## 2016-08-30 MED FILL — MORPHINE SULFATE 15 MG PO TABS: 15 mg | ORAL | Qty: 1

## 2016-08-30 MED FILL — VALACYCLOVIR HCL 500 MG PO TABS: 500 MG | ORAL | Qty: 1

## 2016-08-30 MED FILL — OXYCODONE HCL 15 MG PO TABS: 15 MG | ORAL | Qty: 1

## 2016-08-30 MED FILL — LORAZEPAM 0.5 MG PO TABS: 0.5 MG | ORAL | Qty: 1

## 2016-08-30 MED FILL — CETIRIZINE HCL 10 MG PO TABS: 10 MG | ORAL | Qty: 1

## 2016-08-30 MED FILL — FLUCONAZOLE 200 MG PO TABS: 200 MG | ORAL | Qty: 2

## 2016-08-30 MED FILL — LORAZEPAM 1 MG PO TABS: 1 MG | ORAL | Qty: 1

## 2016-08-30 MED FILL — MORPHINE SULFATE ER 15 MG PO TBCR: 15 mg | ORAL | Qty: 1

## 2016-08-30 MED FILL — LISINOPRIL 10 MG PO TABS: 10 MG | ORAL | Qty: 1

## 2016-08-30 MED FILL — KCL IN DEXTROSE-NACL 10-5-0.45 MEQ/L-%-% IV SOLN: INTRAVENOUS | Qty: 1000

## 2016-08-30 MED FILL — PANTOPRAZOLE SODIUM 40 MG PO TBEC: 40 MG | ORAL | Qty: 1

## 2016-08-30 MED FILL — BACLOFEN 10 MG PO TABS: 10 MG | ORAL | Qty: 2

## 2016-08-30 MED FILL — LOVENOX 40 MG/0.4ML SC SOLN: 40 MG/0.4ML | SUBCUTANEOUS | Qty: 0.4

## 2016-08-30 NOTE — Plan of Care (Signed)
Problem: Infection - Central Venous Catheter-Associated Bloodstream Infection:  Goal: Will show no infection signs and symptoms  Will show no infection signs and symptoms   Outcome: Met This Shift  Pt afebrile this shift.  CVC site and dressing are clean, dry, and intact with no redness or tenderness noted.  Lumens flush well.  Will continue to monitor.      Problem: PROTECTIVE PRECAUTIONS  Goal: Patient will remain free of nosocomial Infections  Outcome: Met This Shift  Pt in private room on low-microbial diet per unit protocol.  Pt, staff, and visitors demonstrate proper hand washing and use of PPE.   Will continue to monitor.      Problem: Pain:  Goal: Pain level will decrease  Pain level will decrease   Outcome: Ongoing  Pt with chronic back pain this shift.  Scheduled MS Contin and PRN Oxycodone provided per order.  Pt reports only mild relief from medication, and has spent much of night in chair to increase comfort.      Problem: Falls - Risk of:  Goal: Will remain free from falls  Will remain free from falls   Outcome: Met This Shift  VSS.  Bed in lowest position with wheels locked.  Pt is up ad lib with steady gait and no slip footwear.  Pt uses call light appropriately.  Will continue to monitor.      Problem: Nausea/Vomiting:  Goal: Absence of nausea/vomiting  Absence of nausea/vomiting   Outcome: Ongoing  Pt with ongoing nausea this shift.  Compazine provided, and pt is satisfied with results.  Will continue to monitor.      Problem: Nutrition Deficit:  Goal: Ability to achieve adequate nutritional intake will improve  Ability to achieve adequate nutritional intake will improve   Outcome: Ongoing  Pt reports decreased appetite and difficulty eating due to ongoing nausea.  Will continue to promote proper nutrition with small, frequent meals.      Problem: Bleeding:  Goal: Will show no signs and symptoms of excessive bleeding  Will show no signs and symptoms of excessive bleeding   Outcome: Met This  Shift  Patient's hemoglobin this AM:   Recent Labs      08/30/16   0404   HGB  10.8*     Patient's platelet count this AM:   Recent Labs      08/30/16   0404   PLT  204    Thrombocytopenia not present at this time.  Patient showing no signs or symptoms of active bleeding.  Transfusion not indicated at this time.  Patient verbalizes understanding of all instructions. Will continue to assess and implement POC. Call light within reach and hourly rounding in place.     Problem: Venous Thromboembolism:  Goal: Will show no signs or symptoms of venous thromboembolism  Will show no signs or symptoms of venous thromboembolism   Outcome: Met This Shift  Adherent with DVT Prevention: Pt is at risk for DVT d/t decreased mobility and cancer treatment.  Pt educated on importance of activity.  Pt has orders for Subcut prophylactic lovenox.  Pt verbalizes understanding of need for prophylaxis while inpatient.

## 2016-08-30 NOTE — Plan of Care (Signed)
Problem: Infection - Central Venous Catheter-Associated Bloodstream Infection:  Goal: Will show no infection signs and symptoms  Will show no infection signs and symptoms   Outcome: Ongoing  CVC site remains free of signs/symptoms of infection. No drainage, edema, erythema, pain, or warmth noted at site. Dressing changes continue per protocol and on an as needed basis - see flowsheet.     Compliant with [facility] Bath Protocol:  Performed CHG bath today per [facility] protocol utilizing CHG solution in the shower.  CVC site cleansed with CHG wipe over dressing, skin surrounding dressing, and first 6" of IV tubing.  Pt tolerated well.  Continued to encourage daily CHG bathing per [facility] protocol.      Problem: PROTECTIVE PRECAUTIONS  Goal: Patient will remain free of nosocomial Infections  Outcome: Ongoing  Pt remains in protective precautions.  Pt educated on wearing mask when in hallways. Pt, staff, and visitors adhering to handwashing guidelines. Pt educated to shower or bathe daily with chlorhexidine and linens changed daily per protocol. Pt verbalizes understanding of low microbial diet. Will continue to monitor.     Problem: Pain:  Goal: Pain level will decrease  Pain level will decrease   Outcome: Ongoing  Pt reports pain of >6 out of 10.  Pt states pain is located in low back.   Administered pain medication see MAR.  Physician changed pain medication and added a muscle relaxer.  Patient also using ice packs to help control pain.  Will contiue to monitor.   R: Pt verbalized understanding of education.     Problem: Falls - Risk of:  Goal: Will remain free from falls  Will remain free from falls   Outcome: Ongoing  Orthostatic vital signs obtained at start of shift - see flowsheet for details.  Pt does not meet criteria for orthostasis.  Pt is a Med fall risk. See Leamon Arnt Fall Score and ABCDS Injury Risk assessments.   - Screening for Orthostasis AND not a High Falls Risk per MORSE/ABCDS: Pt bed is in low position, side rails up,  call light and belongings are in reach.  Fall risk light is on outside pts room.  Pt encouraged to call for assistance as needed. Will continue with hourly rounds for PO intake, pain needs, toileting and repositioning as needed.     Problem: Nausea/Vomiting:  Goal: Absence of nausea/vomiting  Absence of nausea/vomiting   Outcome: Ongoing  Patient reported nausea one time this shift.   Medication provided.  Will continue to monitor.     Problem: Nutrition Deficit:  Goal: Ability to achieve adequate nutritional intake will improve  Ability to achieve adequate nutritional intake will improve   Outcome: Ongoing  Patient encouraged to eat.  Patient did order lunch and patient drank an ensure.  Will continue to monitor.     Problem: Bleeding:  Goal: Will show no signs and symptoms of excessive bleeding  Will show no signs and symptoms of excessive bleeding   Outcome: Ongoing  Patient's hemoglobin this AM:   Recent Labs      08/30/16   0404   HGB  10.8*     Patient's platelet count this AM:   Recent Labs      08/30/16   0404   PLT  204    Thrombocytopenia not present at this time.  Patient showing no signs or symptoms of active bleeding.  Transfusion not indicated at this time.  Patient verbalizes understanding of all instructions. Will continue to assess and implement POC.  Call light within reach and hourly rounding in place.     Problem: Venous Thromboembolism:  Goal: Will show no signs or symptoms of venous thromboembolism  Will show no signs or symptoms of venous thromboembolism   Outcome: Ongoing  Adherent with DVT Prevention: Pt is at risk for DVT d/t decreased mobility and cancer treatment.  Pt educated on importance of activity.  Pt has orders for Subcut prophylactic lovenox.  Pt verbalizes understanding of need for prophylaxis while inpatient.

## 2016-08-30 NOTE — Progress Notes (Signed)
[facility]  Autologous Progress Note    08/30/2016    David Watkins    DOB:  28-Sep-1955    MRN:  [id number]    Referring MD: Eulis Canner, [place] [address]    Subjective:  NBack pain worse today; zoloft at night also better     ECOG PS:  (1) Restricted in physically strenuous activity, ambulatory and able to do work of light nature    Isolation:  None     Medications    Scheduled Meds:  ??? sertraline  50 mg Oral Daily   ??? [START ON 09/02/2016] Tbo-Filgrastim  300 mcg Subcutaneous QPM   ??? enoxaparin  40 mg Subcutaneous Daily   ??? fluconazole  400 mg Oral Daily   ??? Saline Mouthwash  15 mL Swish & Spit 4x Daily AC & HS   ??? sodium chloride flush  10 mL Intravenous 2 times per day   ??? valACYclovir  500 mg Oral BID   ??? furosemide  40 mg Intravenous Q12H   ??? umeclidinium-vilanterol  1 puff Inhalation Daily   ??? cetirizine  10 mg Oral Daily   ??? lisinopril  10 mg Oral Daily   ??? morphine  15 mg Oral BID   ??? pantoprazole  40 mg Oral QAM AC     Continuous Infusions:  ??? sodium chloride     ??? sodium chloride     ??? dextrose 5% and 0.45% NaCl with KCl 10 mEq 100 mL/hr at 08/30/16 1152     PRN Meds:.morphine, baclofen, morphine, sodium chloride, sodium chloride, alteplase, magnesium hydroxide, magnesium sulfate, potassium chloride, Saline Mouthwash, prochlorperazine **OR** prochlorperazine, LORazepam **OR** LORazepam, albuterol sulfate HFA, polyethylene glycol, LORazepam    ROS:  ?? Constitutional: Denies fever, sweats, weight loss. Chronic right shoulder & rib pain  ?? Eyes: No visual changes or diplopia. No scleral icterus.  ?? ENT: No Headaches, hearing loss or vertigo. No mouth sores or sore throat.  ?? Cardiovascular: No chest pain, dyspnea on exertion, palpitations or loss of consciousness.   ?? Respiratory: No cough or wheezing, no sputum production. No hemoptysis.  ?? Gastrointestinal: No abdominal pain, appetite loss, blood in stools. No change in bowel habits.  ?? Genitourinary: No dysuria, trouble  voiding, or hematuria.  ?? Musculoskeletal:  No generalized weakness. No joint complaints.  ?? Integumentary: No rash or pruritis.  ?? Neurological: No headache, diplopia. No change in gait, balance, or coordination. No paresthesias.  ?? Endocrine: No temperature intolerance. No excessive thirst, fluid intake, or urination.   ?? Hematologic/Lymphatic: No abnormal bruising or ecchymoses, blood clots or swollen lymph nodes.  ?? Allergic/Immunologic: No nasal congestion or hives.     Physical Exam:     I&O:      Intake/Output Summary (Last 24 hours) at 08/30/16 1310  Last data filed at 08/30/16 0856   Gross per 24 hour   Intake             3436 ml   Output             2850 ml   Net              586 ml       Vital Signs:  BP 132/85    Pulse 86    Temp 97.8 ??F (36.6 ??C) (Oral)    Resp 16    Ht 5' 8.74" (1.746 m)    Wt 192 lb 4.8 oz (87.2 kg)  SpO2 98%    BMI 28.61 kg/m??     Weight:    Wt Readings from Last 3 Encounters:   08/30/16 192 lb 4.8 oz (87.2 kg)   08/17/16 192 lb (87.1 kg)   08/10/16 202 lb (91.6 kg)       General: Awake, alert and oriented ??  HEENT: normocephalic, alopecia, PERRL, no scleral erythema or icterus, Oral mucosa moist and intact, throat clear.   NECK: supple without palpable adenopathy  BACK: Straight, negative CVAT  SKIN: warm dry and intact without lesions rashes or masses  CHEST: CTA bilaterally without use of accessory muscles  CV: Normal S1 S2, RRR, no MRG  ABD: NT ND normoactive BS, no palpable masses or hepatosplenomegaly  EXTREMITIES: without edema, denies calf tenderness  NEURO: CN II - XII grossly intact  CATHETER: LSC Trifusion (Barrat, 08/17/16) - CDI, no erythema    Data:   CBC:   Recent Labs      08/28/16   0305  08/29/16   0400  08/30/16   0404   WBC  4.2  3.2*  2.7*   HGB  10.5*  10.0*  10.8*   HCT  31.0*  29.7*  32.4*   MCV  87.3  87.1  88.0   PLT  225  181  204     BMP/Mag:  Recent Labs      08/28/16   0305  08/29/16   0400  08/30/16   0404   NA  135*  136  137   K  4.5  3.8  3.9   CL   100  99  99   CO2  24  25  27    PHOS  3.3   --    --    BUN  15  16  15    CREATININE  0.8*  0.7*  0.6*     LIVP:   Recent Labs      08/28/16   0305   AST  33   ALT  34   BILIDIR  <0.2   BILITOT  0.3   ALKPHOS  94     Uric Acid:    Recent Labs      08/30/16   0404   LABURIC  4.7     Coags:   No results for input(s): PROTIME, INR, APTT in the last 72 hours.  PROBLEM LIST: ????????   ????  Multiple myeloma  Hyperlipidemia  HTN  Neoplasm related pain  ????  TREATMENT: ????????   ????  1. Rad Tx to T5-7, T10-L3, Right Scapula - 3000 cGy - Dr. Jamas Lav 03/20/16-04/01/16  2. RVD x1 03/20/16 - discontinued d/t rash   3. Velcade/Pomalyst/Dex x3 cycles 04/23/16-06/17/16 - discontinued d/t reaction to pomalyst  4. Velcade/Dex x 1 cycle 06/26/16 (last dose of dexamethasone 07/22/16  5. High-dose melphalan followed by administration of PBSCs 2.36 x10^6 cd34cells/kg on 08/28/16  ??  ASSESSMENT AND PLAN: ????????   ????  1.  IgG kappa multiple myeloma: PR after 5 cycles chemotherapy - M-spike is 0.4 & BM biopsy 2 % plasma cells  - Admitted for melphalan 256m/m2 preparative regimen followed by administration of PBSCs 2.36 x10^6 cd34cells/kg on 08/28/16. He is NOT an early release candidate d/t his lack of social support and high anxiety    Day +2  ??  2.  ID:  Afebrile, no evidence of infection.    - Cont ppx Valtrex, cont acyclovir at discharge for VZV positive titer.    -  Start Diflucan prophylaxis on 08/28/16   - Start Levaquin when [place] < 1.5  ??  3.  Heme:  Mild anemia from previous chemotherapy & ongoing disease  - Transfuse for Hgb < 7 and Platelets < 10K  - No transfusion today  ??  4.  Metabolic:  Electrolytes are WNL and renal fxn stable  - Start IVF hydration: D51/2NS w/10KCl @ 112m/hr   - Replace potassium and magnesium per policy  - Monitor BG on AM labs  ??  5. GI/Nutrition:  Appetite and oral intake is good.   - Cont low microbial diet   - Follow closely with dietary  - GERD: Cont PPI  - Constipation: Miralax PRN  ??  6. Pain: Chronic back & rib  pain s/p radtx; acute flare of back pain  - Continue 30 mg MS contin BID, change to morphine 15 mg and add baclofen   - Follows with pain specialist as outpt  - Recent PET/CT did not suggest any active disease.  If required we will get a dedicated LS spine series.  He has claustrophobia and cannot tolerate an MRI.  ??  7. 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  8. Chronic Bronchitis/COPD: As evidenced by pre-transplant PFTs - FEV1 is 43%, He did respond to bronchodilators and went up to 52%  - Cont albuterol PRN  - Cont anoro ellipta inhaler  - Cleared for transplant by Dr. MPercell Miller ??  9. Cardiac: h/o HLD. 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 - Cleared for BMT by Dr. WEliane Decree ??  10. Anxiety/Depression  - Ativan PRN  - Dr. SJavier Dockerconsulted  - zoloft ordered, causing fatigue; changed to at night  ??  11. Insomnia:  - Ativan PRN at HS  ??  ??      - DVT Prophylaxis: Platelets >50,000 cells/dL, - daily lovenox prophylaxis ordered  Contraindications to pharmacologic prophylaxis: None  Contraindications to mechanical prophylaxis: None  ??  - Disposition:  Will be discharged after transplant when blood counts have recovered and he is afebrile and eating and drinking well.   ??    Rocio Roam A. FDrucilla Schmidt DO, MS

## 2016-08-31 ENCOUNTER — Inpatient Hospital Stay: Admit: 2016-08-31 | Primary: Hematology & Oncology

## 2016-08-31 LAB — CBC WITH AUTO DIFFERENTIAL
Basophils %: 0.8 %
Basophils Absolute: 0 10*3/uL (ref 0.0–0.2)
Eosinophils %: 0.5 %
Eosinophils Absolute: 0 10*3/uL (ref 0.0–0.6)
Hematocrit: 31.2 % — ABNORMAL LOW (ref 40.5–52.5)
Hemoglobin: 10.5 g/dL — ABNORMAL LOW (ref 13.5–17.5)
Lymphocytes %: 2.3 %
Lymphocytes Absolute: 0 10*3/uL — ABNORMAL LOW (ref 1.0–5.1)
MCH: 29.5 pg (ref 26.0–34.0)
MCHC: 33.7 g/dL (ref 31.0–36.0)
MCV: 87.4 fL (ref 80.0–100.0)
MPV: 6.6 fL (ref 5.0–10.5)
Monocytes %: 1.8 %
Monocytes Absolute: 0 10*3/uL (ref 0.0–1.3)
Neutrophils %: 94.6 %
Neutrophils Absolute: 1.5 10*3/uL — ABNORMAL LOW (ref 1.7–7.7)
Platelets: 183 10*3/uL (ref 135–450)
RBC: 3.57 M/uL — ABNORMAL LOW (ref 4.20–5.90)
RDW: 13.6 % (ref 12.4–15.4)
WBC: 1.6 10*3/uL — ABNORMAL LOW (ref 4.0–11.0)

## 2016-08-31 LAB — URINALYSIS
Bilirubin Urine: NEGATIVE
Blood, Urine: NEGATIVE
Glucose, Ur: NEGATIVE mg/dL
Ketones, Urine: NEGATIVE mg/dL
Leukocyte Esterase, Urine: NEGATIVE
Nitrite, Urine: NEGATIVE
Protein, UA: NEGATIVE mg/dL
Specific Gravity, UA: 1.01 (ref 1.005–1.030)
Urobilinogen, Urine: 0.2 E.U./dL (ref <2.0)
pH, UA: 6 (ref 5.0–8.0)

## 2016-08-31 LAB — PROTIME-INR
INR: 1.03 (ref 0.85–1.15)
Protime: 11.6 s (ref 9.6–13.0)

## 2016-08-31 LAB — BASIC METABOLIC PANEL
Anion Gap: 9 (ref 3–16)
BUN: 11 mg/dL (ref 7–20)
CO2: 25 mmol/L (ref 21–32)
Calcium: 8.2 mg/dL — ABNORMAL LOW (ref 8.3–10.6)
Chloride: 98 mmol/L — ABNORMAL LOW (ref 99–110)
Creatinine: 0.6 mg/dL — ABNORMAL LOW (ref 0.9–1.3)
GFR African American: >60 (ref >60)
GFR Non-African American: >60 (ref >60)
Glucose: 120 mg/dL — ABNORMAL HIGH (ref 70–99)
Potassium: 4.1 mmol/L (ref 3.5–5.1)
Sodium: 132 mmol/L — ABNORMAL LOW (ref 136–145)

## 2016-08-31 LAB — HEPATIC FUNCTION PANEL
ALT: 33 U/L (ref 10–40)
AST: 24 U/L (ref 15–37)
Albumin: 3.4 g/dL (ref 3.4–5.0)
Alkaline Phosphatase: 80 U/L (ref 40–129)
Bilirubin, Direct: <0.2 mg/dL (ref 0.0–0.3)
Total Bilirubin: 0.4 mg/dL (ref 0.0–1.0)
Total Protein: 5.7 g/dL — ABNORMAL LOW (ref 6.4–8.2)

## 2016-08-31 LAB — URIC ACID: Uric Acid, Serum: 3.9 mg/dL (ref 3.5–7.2)

## 2016-08-31 LAB — MAGNESIUM: Magnesium: 2.1 mg/dL (ref 1.80–2.40)

## 2016-08-31 LAB — PHOSPHORUS: Phosphorus: 3.3 mg/dL (ref 2.5–4.9)

## 2016-08-31 LAB — APTT: aPTT: 28.3 s (ref 24.1–34.9)

## 2016-08-31 LAB — LACTATE DEHYDROGENASE: LD: 165 U/L (ref 100–190)

## 2016-08-31 MED ORDER — LEVOFLOXACIN 500 MG PO TABS
500 MG | Freq: Every day | ORAL | Status: DC
Start: 2016-08-31 — End: 2016-09-10
  Administered 2016-09-01 – 2016-09-10 (×11): 500 mg via ORAL

## 2016-08-31 MED ORDER — MORPHINE SULFATE ER 30 MG PO TBCR
30 MG | Freq: Two times a day (BID) | ORAL | Status: DC
Start: 2016-08-31 — End: 2016-09-14
  Administered 2016-09-01 – 2016-09-14 (×27): 30 mg via ORAL

## 2016-08-31 MED ORDER — BACLOFEN 10 MG PO TABS
10 MG | Freq: Three times a day (TID) | ORAL | Status: DC
Start: 2016-08-31 — End: 2016-09-15
  Administered 2016-08-31 – 2016-09-15 (×44): 20 mg via ORAL

## 2016-08-31 MED ORDER — IOPAMIDOL 76 % IV SOLN
76 % | Freq: Once | INTRAVENOUS | Status: AC | PRN
Start: 2016-08-31 — End: 2016-08-31
  Administered 2016-08-31: 18:00:00 80 mL via INTRAVENOUS

## 2016-08-31 MED FILL — PROCHLORPERAZINE MALEATE 10 MG PO TABS: 10 MG | ORAL | Qty: 1

## 2016-08-31 MED FILL — MORPHINE SULFATE ER 15 MG PO TBCR: 15 mg | ORAL | Qty: 1

## 2016-08-31 MED FILL — BACLOFEN 10 MG PO TABS: 10 MG | ORAL | Qty: 2

## 2016-08-31 MED FILL — PROCHLORPERAZINE EDISYLATE 5 MG/ML IJ SOLN: 5 MG/ML | INTRAMUSCULAR | Qty: 2

## 2016-08-31 MED FILL — KCL IN DEXTROSE-NACL 10-5-0.45 MEQ/L-%-% IV SOLN: INTRAVENOUS | Qty: 1000

## 2016-08-31 MED FILL — VALACYCLOVIR HCL 500 MG PO TABS: 500 MG | ORAL | Qty: 1

## 2016-08-31 MED FILL — MORPHINE SULFATE 15 MG PO TABS: 15 mg | ORAL | Qty: 1

## 2016-08-31 MED FILL — CETIRIZINE HCL 10 MG PO TABS: 10 MG | ORAL | Qty: 1

## 2016-08-31 MED FILL — LISINOPRIL 10 MG PO TABS: 10 MG | ORAL | Qty: 1

## 2016-08-31 MED FILL — PANTOPRAZOLE SODIUM 40 MG PO TBEC: 40 MG | ORAL | Qty: 1

## 2016-08-31 MED FILL — LOVENOX 40 MG/0.4ML SC SOLN: 40 MG/0.4ML | SUBCUTANEOUS | Qty: 0.4

## 2016-08-31 MED FILL — FLUCONAZOLE 200 MG PO TABS: 200 MG | ORAL | Qty: 2

## 2016-08-31 MED FILL — SERTRALINE HCL 50 MG PO TABS: 50 MG | ORAL | Qty: 1

## 2016-08-31 NOTE — Plan of Care (Signed)
Problem: Infection - Central Venous Catheter-Associated Bloodstream Infection:  Goal: Will show no infection signs and symptoms  Will show no infection signs and symptoms   Left triple lumen catheter site clean, dry, and intact. No redness or tenderness noted. Afebrile throughout shift.     Problem: PROTECTIVE PRECAUTIONS  Goal: Patient will remain free of nosocomial Infections  Outcome: Ongoing      Problem: Pain:  Goal: Pain level will decrease  Pain level will decrease   Patient c/o chronic back pain. Medicated with scheduled MS contin and Baclofen as ordered. Medicated with immediate release morphine as ordered per pt request with some relief.     Problem: Falls - Risk of:  Goal: Will remain free from falls  Will remain free from falls   Patient alert & oriented x 4. Up ad lib in room with steady gait. Up to chair throughout shift. Currently resting in bed with side rails x 2. Bed in lowest position. Belongings and call light in reach. Remains free from falls and injury throughout shift.     Problem: Nausea/Vomiting:  Goal: Absence of nausea/vomiting  Absence of nausea/vomiting   Outcome: Ongoing  Patient c/o nausea. Medicated with compazine as ordered. No further c/o nausea or vomiting.     Problem: Nutrition Deficit:  Goal: Ability to achieve adequate nutritional intake will improve  Ability to achieve adequate nutritional intake will improve   Patient decreased appetite. Encouraged small frequent meals. Tolerating fluids.     Problem: Bleeding:  Goal: Will show no signs and symptoms of excessive bleeding  Will show no signs and symptoms of excessive bleeding   Hemoglobin 10.5 and Platelets 183.     Problem: Venous Thromboembolism:  Goal: Will show no signs or symptoms of venous thromboembolism  Will show no signs or symptoms of venous thromboembolism   Adherent with DVT Prevention: Pt is at risk for DVT d/t decreased mobility and cancer treatment.  Pt educated on importance of activity.  Pt has orders for  Subcutaneous prophylactic lovenox.  Pt verbalizes understanding of need for prophylaxis while inpatient.

## 2016-08-31 NOTE — Progress Notes (Signed)
[facility]  Autologous Progress Note    08/31/2016    David Watkins    DOB:  Mar 31, 1956    MRN:  [id number]    Referring MD: Eulis Canner, [place] [address]    Subjective:  Back pain "bad" according to the patient, but resting comfortably in chair today - again, didn't sleep well     ECOG PS:  (1) Restricted in physically strenuous activity, ambulatory and able to do work of light nature    Isolation:  None     Medications    Scheduled Meds:  ??? sertraline  50 mg Oral Daily   ??? [START ON 09/02/2016] Tbo-Filgrastim  300 mcg Subcutaneous QPM   ??? enoxaparin  40 mg Subcutaneous Daily   ??? fluconazole  400 mg Oral Daily   ??? Saline Mouthwash  15 mL Swish & Spit 4x Daily AC & HS   ??? sodium chloride flush  10 mL Intravenous 2 times per day   ??? valACYclovir  500 mg Oral BID   ??? furosemide  40 mg Intravenous Q12H   ??? umeclidinium-vilanterol  1 puff Inhalation Daily   ??? cetirizine  10 mg Oral Daily   ??? lisinopril  10 mg Oral Daily   ??? morphine  15 mg Oral BID   ??? pantoprazole  40 mg Oral QAM AC     Continuous Infusions:  ??? sodium chloride     ??? sodium chloride     ??? dextrose 5% and 0.45% NaCl with KCl 10 mEq 100 mL/hr at 08/31/16 0731     PRN Meds:.morphine, baclofen, morphine, sodium chloride, sodium chloride, alteplase, magnesium hydroxide, magnesium sulfate, potassium chloride, Saline Mouthwash, prochlorperazine **OR** prochlorperazine, LORazepam **OR** LORazepam, albuterol sulfate HFA, polyethylene glycol, LORazepam    ROS:  ?? Constitutional: Denies fever, sweats, weight loss.  ?? Eyes: No visual changes or diplopia. No scleral icterus.  ?? ENT: No Headaches, hearing loss or vertigo. No mouth sores or sore throat.  ?? Cardiovascular: No chest pain, dyspnea on exertion, palpitations or loss of consciousness.   ?? Respiratory: No cough or wheezing, no sputum production. No hemoptysis.  ?? Gastrointestinal: No abdominal pain, appetite loss, blood in stools. No change in bowel habits.  ?? Genitourinary:  No dysuria, trouble voiding, or hematuria.  ?? Musculoskeletal:  No generalized weakness. No joint complaints.  ?? Integumentary: No rash or pruritis.  ?? Neurological: No headache, diplopia. No change in gait, balance, or coordination. No paresthesias.  ?? Endocrine: No temperature intolerance. No excessive thirst, fluid intake, or urination.   ?? Hematologic/Lymphatic: No abnormal bruising or ecchymoses, blood clots or swollen lymph nodes.  ?? Allergic/Immunologic: No nasal congestion or hives.     Physical Exam:     I&O:      Intake/Output Summary (Last 24 hours) at 08/31/16 1201  Last data filed at 08/31/16 0633   Gross per 24 hour   Intake             3349 ml   Output             1950 ml   Net             1399 ml       Vital Signs:  BP 119/84    Pulse 90    Temp 97.9 ??F (36.6 ??C) (Oral)    Resp 16    Ht 5' 8.74" (1.746 m)    Wt 191 lb 1.6 oz (  86.7 kg)    SpO2 98%    BMI 28.43 kg/m??     Weight:    Wt Readings from Last 3 Encounters:   08/31/16 191 lb 1.6 oz (86.7 kg)   08/17/16 192 lb (87.1 kg)   08/10/16 202 lb (91.6 kg)       General: Awake, alert and oriented ??  HEENT: normocephalic, alopecia, PERRL, no scleral erythema or icterus, Oral mucosa moist and intact, throat clear.   NECK: supple without palpable adenopathy  BACK: Straight, negative CVAT  SKIN: warm dry and intact without lesions rashes or masses  CHEST: CTA bilaterally without use of accessory muscles  CV: Normal S1 S2, RRR, no MRG  ABD: NT ND normoactive BS, no palpable masses or hepatosplenomegaly  EXTREMITIES: without edema, denies calf tenderness  NEURO: CN II - XII grossly intact  CATHETER: LSC Trifusion (Barrat, 08/17/16) - CDI, no erythema    Data:   CBC:   Recent Labs      08/29/16   0400  08/30/16   0404  08/31/16   0346   WBC  3.2*  2.7*  1.6*   HGB  10.0*  10.8*  10.5*   HCT  29.7*  32.4*  31.2*   MCV  87.1  88.0  87.4   PLT  181  204  183     BMP/Mag:  Recent Labs      08/29/16   0400  08/30/16   0404  08/31/16   0346   NA  136  137  132*   K   3.8  3.9  4.1   CL  99  99  98*   CO2  25  27  25    PHOS   --    --   3.3   BUN  16  15  11    CREATININE  0.7*  0.6*  0.6*   MG   --    --   2.10     LIVP:   Recent Labs      08/31/16   0346   AST  24   ALT  33   BILIDIR  <0.2   BILITOT  0.4   ALKPHOS  80     Uric Acid:    Recent Labs      08/31/16   0346   LABURIC  3.9     Coags:   Recent Labs      08/31/16   0340   PROTIME  11.6   INR  1.03   APTT  28.3     PROBLEM LIST: ????????   ????  Multiple myeloma  Hyperlipidemia  HTN  Neoplasm related pain  ????  TREATMENT: ????????   ????  1. Rad Tx to T5-7, T10-L3, Right Scapula - 3000 cGy - Dr. Jamas Lav 03/20/16-04/01/16  2. RVD x1 03/20/16 - discontinued d/t rash   3. Velcade/Pomalyst/Dex x3 cycles 04/23/16-06/17/16 - discontinued d/t reaction to pomalyst  4. Velcade/Dex x 1 cycle 06/26/16 (last dose of dexamethasone 07/22/16  5. High-dose melphalan followed by administration of PBSCs 2.36 x10^6 cd34cells/kg on 08/28/16  ??  ASSESSMENT AND PLAN: ????????   ????  1.  IgG kappa multiple myeloma: PR after 5 cycles chemotherapy - M-spike is 0.4 & BM biopsy 2 % plasma cells  - Admitted for melphalan 240m/m2 preparative regimen followed by administration of PBSCs 2.36 x10^6 cd34cells/kg on 08/28/16. He is NOT an early release candidate d/t his lack of social support and high anxiety  Day +3  ??  2.  ID:  Afebrile, no evidence of infection.    - acyclovir at discharge for VZV positive titer.    - Valtrex, Diflucan & Levaquin  ??  3.  Heme:  Mild anemia from previous chemotherapy & ongoing disease  - Transfuse for Hgb < 7 and Platelets < 10K  - No transfusion today  ??  4.  Metabolic:  Electrolytes are WNL and renal fxn stable  - IVF hydration: D51/2NS w/10KCl @ 172m/hr   - Replace potassium and magnesium per policy  - Monitor BG on AM labs  ??  5. GI/Nutrition:  Appetite and oral intake is good.   - Cont low microbial diet   - Follow closely with dietary  - GERD: Cont PPI  - Constipation: Miralax PRN  ??  6. Pain: Chronic back & rib pain s/p radtx;  acute flare of back pain  - MS contin, up to 30 mg BID, change to morphine 15 mg q 4 and added baclofen 20 tid  - Follows with pain specialist as outpt  - Get CT thoracic and lumbar spine  ??  7. 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  8. Chronic Bronchitis/COPD: As evidenced by pre-transplant PFTs - FEV1 is 43%, He did respond to bronchodilators and went up to 52%  - Cont albuterol PRN  - Cont anoro ellipta inhaler  - Cleared for transplant by Dr. MPercell Miller ??  9. Cardiac: h/o HLD. 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 - Cleared for BMT by Dr. WEliane Decree ??  10. Anxiety/Depression  - Ativan PRN  - Dr. SJavier Dockerconsulted  - zoloft ordered, causing fatigue; changed to at night - better  ??  11. Insomnia:  - Ativan PRN qHS - some of this due to pain also  ??  ??      - DVT Prophylaxis: Platelets >50,000 cells/dL, - daily lovenox prophylaxis ordered  Contraindications to pharmacologic prophylaxis: None  Contraindications to mechanical prophylaxis: None  ??  - Disposition:  Will be discharged after transplant when blood counts have recovered and he is afebrile and eating and drinking well.   ??    Tawnia Schirm A. FDrucilla Schmidt DO, MS

## 2016-08-31 NOTE — Plan of Care (Signed)
Problem: Infection - Central Venous Catheter-Associated Bloodstream Infection:  Goal: Will show no infection signs and symptoms  Will show no infection signs and symptoms   Outcome: Met This Shift  Pt afebrile this shift.  CVC site and dressing are clean, dry, and intact with no redness or tenderness noted.  Lumens flush well.  Will continue to monitor.      Problem: PROTECTIVE PRECAUTIONS  Goal: Patient will remain free of nosocomial Infections  Outcome: Met This Shift  Pt in private room on low-microbial diet per unit protocol.  Pt, staff, and visitors demonstrate proper hand washing and use of PPE.   Will continue to monitor.      Problem: Pain:  Goal: Pain level will decrease  Pain level will decrease   Outcome: Ongoing  Pt with chronic back pain this shift.  Scheduled MS Contin and PRN Morphine.  Pt reports only mild relief from medication, and has spent much of night in chair to increase comfort.      Problem: Falls - Risk of:  Goal: Will remain free from falls  Will remain free from falls   Outcome: Met This Shift  VSS.  Bed in lowest position with wheels locked.  Pt is up ad lib with steady gait and no slip footwear.  Pt uses call light appropriately.  Will continue to monitor.      Problem: Nausea/Vomiting:  Goal: Absence of nausea/vomiting  Absence of nausea/vomiting   Outcome: Ongoing  Pt with ongoing nausea this shift.  Compazine provided, and pt is satisfied with results.  Will continue to monitor.      Problem: Nutrition Deficit:  Goal: Ability to achieve adequate nutritional intake will improve  Ability to achieve adequate nutritional intake will improve   Outcome: Ongoing  Pt reports decreased appetite and difficulty eating due to ongoing nausea.  Will continue to promote proper nutrition with small, frequent meals.      Problem: Bleeding:  Goal: Will show no signs and symptoms of excessive bleeding  Will show no signs and symptoms of excessive bleeding   Outcome: Met This Shift  Patient's hemoglobin  this AM:   Recent Labs      08/31/16   0346   HGB  10.5*     Patient's platelet count this AM:   Recent Labs      08/31/16   0346   PLT  183    Thrombocytopenia not present at this time.  Patient showing no signs or symptoms of active bleeding.  Transfusion not indicated at this time.  Patient verbalizes understanding of all instructions. Will continue to assess and implement POC. Call light within reach and hourly rounding in place.     Problem: Venous Thromboembolism:  Goal: Will show no signs or symptoms of venous thromboembolism  Will show no signs or symptoms of venous thromboembolism   Outcome: Met This Shift  Adherent with DVT Prevention: Pt is at risk for DVT d/t decreased mobility and cancer treatment.  Pt educated on importance of activity.  Pt has orders for Subcut prophylactic lovenox.  Pt verbalizes understanding of need for prophylaxis while inpatient.

## 2016-09-01 LAB — BASIC METABOLIC PANEL
Anion Gap: 10 (ref 3–16)
BUN: 10 mg/dL (ref 7–20)
CO2: 25 mmol/L (ref 21–32)
Calcium: 8.2 mg/dL — ABNORMAL LOW (ref 8.3–10.6)
Chloride: 104 mmol/L (ref 99–110)
Creatinine: 0.6 mg/dL — ABNORMAL LOW (ref 0.9–1.3)
GFR African American: >60 (ref >60)
GFR Non-African American: >60 (ref >60)
Glucose: 133 mg/dL — ABNORMAL HIGH (ref 70–99)
Potassium: 4.4 mmol/L (ref 3.5–5.1)
Sodium: 139 mmol/L (ref 136–145)

## 2016-09-01 LAB — CBC WITH AUTO DIFFERENTIAL
Basophils %: 1.7 %
Basophils Absolute: 0 10*3/uL (ref 0.0–0.2)
Eosinophils %: 2.1 %
Eosinophils Absolute: 0 10*3/uL (ref 0.0–0.6)
Hematocrit: 31.3 % — ABNORMAL LOW (ref 40.5–52.5)
Hemoglobin: 10.8 g/dL — ABNORMAL LOW (ref 13.5–17.5)
Lymphocytes %: 4.4 %
Lymphocytes Absolute: 0 10*3/uL — ABNORMAL LOW (ref 1.0–5.1)
MCH: 29.8 pg (ref 26.0–34.0)
MCHC: 34.4 g/dL (ref 31.0–36.0)
MCV: 86.6 fL (ref 80.0–100.0)
MPV: 6.5 fL (ref 5.0–10.5)
Monocytes %: 3.6 %
Monocytes Absolute: 0 10*3/uL (ref 0.0–1.3)
Neutrophils %: 88.2 %
Neutrophils Absolute: 0.8 10*3/uL — CL (ref 1.7–7.7)
Platelets: 142 10*3/uL (ref 135–450)
RBC: 3.62 M/uL — ABNORMAL LOW (ref 4.20–5.90)
RDW: 13.5 % (ref 12.4–15.4)
WBC: 0.9 10*3/uL — ABNORMAL LOW (ref 4.0–11.0)

## 2016-09-01 LAB — URIC ACID: Uric Acid, Serum: 3.3 mg/dL — ABNORMAL LOW (ref 3.5–7.2)

## 2016-09-01 MED FILL — PROCHLORPERAZINE MALEATE 10 MG PO TABS: 10 MG | ORAL | Qty: 1

## 2016-09-01 MED FILL — VALACYCLOVIR HCL 500 MG PO TABS: 500 MG | ORAL | Qty: 1

## 2016-09-01 MED FILL — MORPHINE SULFATE 15 MG PO TABS: 15 mg | ORAL | Qty: 1

## 2016-09-01 MED FILL — PROCHLORPERAZINE EDISYLATE 5 MG/ML IJ SOLN: 5 MG/ML | INTRAMUSCULAR | Qty: 2

## 2016-09-01 MED FILL — ORAMORPH SR 30 MG PO TBCR: 30 mg | ORAL | Qty: 1

## 2016-09-01 MED FILL — LOVENOX 40 MG/0.4ML SC SOLN: 40 MG/0.4ML | SUBCUTANEOUS | Qty: 0.4

## 2016-09-01 MED FILL — PANTOPRAZOLE SODIUM 40 MG PO TBEC: 40 MG | ORAL | Qty: 1

## 2016-09-01 MED FILL — KCL IN DEXTROSE-NACL 10-5-0.45 MEQ/L-%-% IV SOLN: INTRAVENOUS | Qty: 1000

## 2016-09-01 MED FILL — SERTRALINE HCL 50 MG PO TABS: 50 MG | ORAL | Qty: 1

## 2016-09-01 MED FILL — LEVAQUIN 500 MG PO TABS: 500 MG | ORAL | Qty: 1

## 2016-09-01 MED FILL — LORAZEPAM 2 MG/ML IJ SOLN: 2 MG/ML | INTRAMUSCULAR | Qty: 1

## 2016-09-01 MED FILL — BACLOFEN 10 MG PO TABS: 10 MG | ORAL | Qty: 2

## 2016-09-01 MED FILL — LISINOPRIL 10 MG PO TABS: 10 MG | ORAL | Qty: 1

## 2016-09-01 MED FILL — CETIRIZINE HCL 10 MG PO TABS: 10 MG | ORAL | Qty: 1

## 2016-09-01 MED FILL — LORAZEPAM 0.5 MG PO TABS: 0.5 MG | ORAL | Qty: 1

## 2016-09-01 MED FILL — FLUCONAZOLE 200 MG PO TABS: 200 MG | ORAL | Qty: 2

## 2016-09-01 NOTE — Plan of Care (Signed)
Problem: Infection - Central Venous Catheter-Associated Bloodstream Infection:  Goal: Will show no infection signs and symptoms  Will show no infection signs and symptoms   Outcome: Met This Shift  CVC site remains free of signs/symptoms of infection. No drainage, edema, erythema, pain, or warmth noted at site. Dressing changes continue per protocol and on an as needed basis - see flowsheet.     Compliant with [facility] Bath Protocol:  Performed CHG bath today per [facility] protocol utilizing CHG solution in the shower.  CVC site cleansed with CHG wipe over dressing, skin surrounding dressing, and first 6" of IV tubing.  Pt tolerated well.  Continued to encourage daily CHG bathing per [facility] protocol.    Problem: PROTECTIVE PRECAUTIONS  Goal: Patient will remain free of nosocomial Infections  Outcome: Met This Shift  Pt remains in protective precautions.  Pt educated on wearing mask when in hallways. Pt, staff, and visitors adhering to handwashing guidelines. Pt educated to shower or bathe daily with chlorhexidine and linens changed daily per protocol. Pt verbalizes understanding of low microbial diet. Will continue to monitor.     Problem: Falls - Risk of:  Goal: Will remain free from falls  Will remain free from falls   Outcome: Met This Shift  Orthostatic vital signs obtained at start of shift - see flowsheet for details.  Pt does not meet criteria for orthostasis.  Pt is a Med fall risk. See Leamon Arnt Fall Score and ABCDS Injury Risk assessments.   - Screening for Orthostasis AND not a High Falls Risk per MORSE/ABCDS: Pt bed is in low position, side rails up, call light and belongings are in reach.  Fall risk light is on outside pts room.  Pt encouraged to call for assistance as needed. Will continue with hourly rounds for PO intake, pain needs, toileting and repositioning as needed.     Problem: Nausea/Vomiting:  Goal: Absence of nausea/vomiting  Absence of nausea/vomiting   Outcome: Ongoing  x2 episodes vomiting.  Antiemetics  per order and pt states relief, see MAR.    Problem: Bleeding:  Goal: Will show no signs and symptoms of excessive bleeding  Will show no signs and symptoms of excessive bleeding   Outcome: Met This Shift  Patient's hemoglobin this AM:   Recent Labs      09/01/16   0313   HGB  10.8*     Patient's platelet count this AM:   Recent Labs      09/01/16   0313   PLT  142    Thrombocytopenia Precautions in place.  Patient showing no signs or symptoms of active bleeding.  Transfusion not indicated at this time.  Patient verbalizes understanding of all instructions. Will continue to assess and implement POC. Call light within reach and hourly rounding in place.     Problem: Venous Thromboembolism:  Goal: Will show no signs or symptoms of venous thromboembolism  Will show no signs or symptoms of venous thromboembolism   Adherent with DVT Prevention: Pt is at risk for DVT d/t decreased mobility and cancer treatment.  Pt educated on importance of activity.  Pt has orders for Subcut prophylactic lovenox.  Pt verbalizes understanding of need for prophylaxis while inpatient.

## 2016-09-01 NOTE — Plan of Care (Signed)
Problem: Infection - Central Venous Catheter-Associated Bloodstream Infection:  Goal: Will show no infection signs and symptoms  Will show no infection signs and symptoms   Outcome: Met This Shift  Pt afebrile this shift.  CVC site and dressing are clean, dry, and intact with no redness or tenderness noted.  Lumens flush well.  Will continue to monitor.      Problem: PROTECTIVE PRECAUTIONS  Goal: Patient will remain free of nosocomial Infections  Outcome: Met This Shift  Pt in private room on low-microbial diet per unit protocol.  Pt, staff, and visitors demonstrate proper hand washing and use of PPE.   Will continue to monitor.      Problem: Pain:  Goal: Pain level will decrease  Pain level will decrease   Outcome: Ongoing  Pt with chronic back pain this shift.  Scheduled MS Contin and PRN Morphine.  Pt reports moderate relief from medication.  Will continue to monitor.       Problem: Falls - Risk of:  Goal: Will remain free from falls  Will remain free from falls   Outcome: Met This Shift  VSS.  Bed in lowest position with wheels locked.  Pt is up ad lib with steady gait and no slip footwear.  Pt uses call light appropriately.  Will continue to monitor.      Problem: Nausea/Vomiting:  Goal: Absence of nausea/vomiting  Absence of nausea/vomiting   Outcome: Ongoing  Pt with ongoing nausea this shift.  Compazine provided, and pt is satisfied with results.  Will continue to monitor.      Problem: Nutrition Deficit:  Goal: Ability to achieve adequate nutritional intake will improve  Ability to achieve adequate nutritional intake will improve   Outcome: Ongoing  Pt reports decreased appetite and difficulty eating due to ongoing nausea.  Will continue to promote proper nutrition with small, frequent meals.      Problem: Bleeding:  Goal: Will show no signs and symptoms of excessive bleeding  Will show no signs and symptoms of excessive bleeding   Outcome: Met This Shift  Patient's hemoglobin this AM:   Recent Labs       09/01/16   0313   HGB  10.8*     Patient's platelet count this AM:   Recent Labs      09/01/16   0313   PLT  142    Thrombocytopenia not present at this time.  Patient showing no signs or symptoms of active bleeding.  Transfusion not indicated at this time.  Patient verbalizes understanding of all instructions. Will continue to assess and implement POC. Call light within reach and hourly rounding in place.     Problem: Venous Thromboembolism:  Goal: Will show no signs or symptoms of venous thromboembolism  Will show no signs or symptoms of venous thromboembolism   Outcome: Met This Shift  Adherent with DVT Prevention: Pt is at risk for DVT d/t decreased mobility and cancer treatment.  Pt educated on importance of activity.  Pt has orders for Subcut prophylactic lovenox.  Pt verbalizes understanding of need for prophylaxis while inpatient.

## 2016-09-01 NOTE — Progress Notes (Signed)
David Watkins  Autologous Progress Note    09/01/2016    David Watkins    DOB:  05-18-1956    MRN:  [id number]    Referring MD: Eulis Canner, [place] [address]    Subjective:  Back pain better     ECOG PS:  (1) Restricted in physically strenuous activity, ambulatory and able to do work of light nature    Isolation:  None     Medications    Scheduled Meds:  ??? levofloxacin  500 mg Oral Daily   ??? morphine  30 mg Oral BID   ??? baclofen  20 mg Oral TID   ??? sertraline  50 mg Oral Daily   ??? [START ON 09/02/2016] Tbo-Filgrastim  300 mcg Subcutaneous QPM   ??? enoxaparin  40 mg Subcutaneous Daily   ??? fluconazole  400 mg Oral Daily   ??? Saline Mouthwash  15 mL Swish & Spit 4x Daily AC & HS   ??? sodium chloride flush  10 mL Intravenous 2 times per day   ??? valACYclovir  500 mg Oral BID   ??? furosemide  40 mg Intravenous Q12H   ??? umeclidinium-vilanterol  1 puff Inhalation Daily   ??? cetirizine  10 mg Oral Daily   ??? lisinopril  10 mg Oral Daily   ??? pantoprazole  40 mg Oral QAM AC     Continuous Infusions:  ??? sodium chloride     ??? sodium chloride     ??? dextrose 5% and 0.45% NaCl with KCl 10 mEq 100 mL/hr at 09/01/16 0321     PRN Meds:.morphine, sodium chloride, sodium chloride, alteplase, magnesium hydroxide, magnesium sulfate, potassium chloride, Saline Mouthwash, prochlorperazine **OR** prochlorperazine, LORazepam **OR** LORazepam, albuterol sulfate HFA, polyethylene glycol, LORazepam    ROS:  ?? Constitutional: Denies fever, sweats, weight loss.  ?? Eyes: No visual changes or diplopia. No scleral icterus.  ?? ENT: No Headaches, hearing loss or vertigo. No mouth sores or sore throat.  ?? Cardiovascular: No chest pain, dyspnea on exertion, palpitations or loss of consciousness.   ?? Respiratory: No cough or wheezing, no sputum production. No hemoptysis.  ?? Gastrointestinal: No abdominal pain, appetite loss, blood in stools. No change in bowel habits.  ?? Genitourinary: No dysuria, trouble voiding, or  hematuria.  ?? Musculoskeletal:  No generalized weakness. No joint complaints.  ?? Integumentary: No rash or pruritis.  ?? Neurological: No headache, diplopia. No change in gait, balance, or coordination. No paresthesias.  ?? Endocrine: No temperature intolerance. No excessive thirst, fluid intake, or urination.   ?? Hematologic/Lymphatic: No abnormal bruising or ecchymoses, blood clots or swollen lymph nodes.  ?? Allergic/Immunologic: No nasal congestion or hives.     Physical Exam:     I&O:      Intake/Output Summary (Last 24 hours) at 09/01/16 1131  Last data filed at 09/01/16 0645   Gross per 24 hour   Intake             2973 ml   Output             2000 ml   Net              973 ml       Vital Signs:  BP 117/67    Pulse 86    Temp 97.9 ??F (36.6 ??C) (Oral)    Resp 18    Ht 5' 8.74" (1.746 m)    Wt 187 lb (  84.8 kg)    SpO2 96%    BMI 27.82 kg/m??     Weight:    Wt Readings from Last 3 Encounters:   09/01/16 187 lb (84.8 kg)   08/17/16 192 lb (87.1 kg)   08/10/16 202 lb (91.6 kg)       General: Awake, alert and oriented ??  HEENT: normocephalic, alopecia, PERRL, no scleral erythema or icterus, Oral mucosa moist and intact, throat clear.   NECK: supple without palpable adenopathy  BACK: Straight, negative CVAT  SKIN: warm dry and intact without lesions rashes or masses  CHEST: CTA bilaterally without use of accessory muscles  CV: Normal S1 S2, RRR, no MRG  ABD: NT ND normoactive BS, no palpable masses or hepatosplenomegaly  EXTREMITIES: without edema, denies calf tenderness  NEURO: CN II - XII grossly intact  CATHETER: LSC Trifusion (Barrat, 08/17/16) - CDI, no erythema    Data:   CBC:   Recent Labs      08/30/16   0404  08/31/16   0346  09/01/16   0313   WBC  2.7*  1.6*  0.9*   HGB  10.8*  10.5*  10.8*   HCT  32.4*  31.2*  31.3*   MCV  88.0  87.4  86.6   PLT  204  183  142     BMP/Mag:  Recent Labs      08/30/16   0404  08/31/16   0346  09/01/16   0313   NA  137  132*  139   K  3.9  4.1  4.4   CL  99  98*  104   CO2  27   25  25    PHOS   --   3.3   --    BUN  15  11  10    CREATININE  0.6*  0.6*  0.6*   MG   --   2.10   --      LIVP:   Recent Labs      08/31/16   0346   AST  24   ALT  33   BILIDIR  <0.2   BILITOT  0.4   ALKPHOS  80     Uric Acid:    Recent Labs      09/01/16   0313   LABURIC  3.3*     Coags:   Recent Labs      08/31/16   0340   PROTIME  11.6   INR  1.03   APTT  28.3     PROBLEM LIST: ????????   ????  Multiple myeloma  Hyperlipidemia  HTN  Neoplasm related pain  ????  TREATMENT: ????????   ????  1. Rad Tx to T5-7, T10-L3, Right Scapula - 3000 cGy - Dr. Jamas Lav 03/20/16-04/01/16  2. RVD x1 03/20/16 - discontinued d/t rash   3. Velcade/Pomalyst/Dex x3 cycles 04/23/16-06/17/16 - discontinued d/t reaction to pomalyst  4. Velcade/Dex x 1 cycle 06/26/16 (last dose of dexamethasone 07/22/16  5. High-dose melphalan followed by administration of PBSCs 2.36 x10^6 cd34cells/kg on 08/28/16  ??  ASSESSMENT AND PLAN: ????????   ????  1.  IgG kappa multiple myeloma: PR after 5 cycles chemotherapy - M-spike is 0.4 & BM biopsy 2 % plasma cells  - Admitted for melphalan 281m/m2 preparative regimen followed by administration of PBSCs 2.36 x10^6 cd34cells/kg on 08/28/16. He is NOT an early release candidate d/t his lack of social support and high anxiety  Day +4  ??  2.  ID:  Afebrile, no evidence of infection.    - acyclovir at discharge for VZV positive titer.    - Valtrex, Diflucan & Levaquin  ??  3.  Heme:  Mild anemia from previous chemotherapy & ongoing disease  - Transfuse for Hgb < 7 and Platelets < 10K  - No transfusion today  ??  4.  Metabolic:  Electrolytes are WNL and renal fxn stable  - IVF hydration: D51/2NS w/10KCl @ 145m/hr   - Replace potassium and magnesium per policy  - Monitor BG on AM labs  ??  5. GI/Nutrition:  Appetite and oral intake is good.   - Cont low microbial diet   - Follow closely with dietary  - GERD: Cont PPI  - Constipation: Miralax PRN  ??  6. Pain: Chronic back & rib pain s/p radtx; acute flare of back pain  - MS contin, up to 30  mg BID, change to morphine 15 mg q 4 and added baclofen 20 tid  - Follows with pain specialist as outpt  - Get CT thoracic and lumbar spine  ??  7. 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  8. Chronic Bronchitis/COPD: As evidenced by pre-transplant PFTs - FEV1 is 43%, He did respond to bronchodilators and went up to 52%  - Cont albuterol PRN  - Cont anoro ellipta inhaler  - Cleared for transplant by Dr. MPercell Miller ??  9. Cardiac: h/o HLD. 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 - Cleared for BMT by Dr. WEliane Decree ??  10. Anxiety/Depression  - Ativan PRN  - Dr. SJavier Dockerconsulted  - zoloft ordered, causing fatigue; changed to at night - better  ??  11. Insomnia:  - Ativan PRN qHS - some of this due to pain also  ??  ??      - DVT Prophylaxis: Platelets >50,000 cells/dL, - daily lovenox prophylaxis ordered  Contraindications to pharmacologic prophylaxis: None  Contraindications to mechanical prophylaxis: None  ??  - Disposition:  Will be discharged after transplant when blood counts have recovered and he is afebrile and eating and drinking well.   ??    Iyanna Drummer A. FDrucilla Schmidt DO, MS

## 2016-09-02 LAB — CBC WITH AUTO DIFFERENTIAL
Hematocrit: 32.2 % — ABNORMAL LOW (ref 40.5–52.5)
Hemoglobin: 10.6 g/dL — ABNORMAL LOW (ref 13.5–17.5)
MCH: 28.7 pg (ref 26.0–34.0)
MCHC: 32.8 g/dL (ref 31.0–36.0)
MCV: 87.4 fL (ref 80.0–100.0)
MPV: 6.8 fL (ref 5.0–10.5)
Platelets: 106 10*3/uL — ABNORMAL LOW (ref 135–450)
RBC: 3.68 M/uL — ABNORMAL LOW (ref 4.20–5.90)
RDW: 13.4 % (ref 12.4–15.4)
WBC: 0.2 10*3/uL — CL (ref 4.0–11.0)

## 2016-09-02 LAB — HEPATIC FUNCTION PANEL
ALT: 22 U/L (ref 10–40)
AST: 18 U/L (ref 15–37)
Albumin: 3.6 g/dL (ref 3.4–5.0)
Alkaline Phosphatase: 77 U/L (ref 40–129)
Bilirubin, Direct: <0.2 mg/dL (ref 0.0–0.3)
Total Bilirubin: 0.3 mg/dL (ref 0.0–1.0)
Total Protein: 5.9 g/dL — ABNORMAL LOW (ref 6.4–8.2)

## 2016-09-02 LAB — BASIC METABOLIC PANEL
Anion Gap: 12 (ref 3–16)
BUN: 10 mg/dL (ref 7–20)
CO2: 24 mmol/L (ref 21–32)
Calcium: 8 mg/dL — ABNORMAL LOW (ref 8.3–10.6)
Chloride: 105 mmol/L (ref 99–110)
Creatinine: 0.6 mg/dL — ABNORMAL LOW (ref 0.9–1.3)
GFR African American: >60 (ref >60)
GFR Non-African American: >60 (ref >60)
Glucose: 118 mg/dL — ABNORMAL HIGH (ref 70–99)
Potassium: 4.1 mmol/L (ref 3.5–5.1)
Sodium: 141 mmol/L (ref 136–145)

## 2016-09-02 LAB — C DIFF TOXIN/ANTIGEN: C difficile Toxin, EIA: NEGATIVE

## 2016-09-02 LAB — URIC ACID: Uric Acid, Serum: 3.1 mg/dL — ABNORMAL LOW (ref 3.5–7.2)

## 2016-09-02 LAB — PHOSPHORUS: Phosphorus: 3.2 mg/dL (ref 2.5–4.9)

## 2016-09-02 LAB — LACTATE DEHYDROGENASE: LD: 144 U/L (ref 100–190)

## 2016-09-02 MED ORDER — LOPERAMIDE HCL 2 MG PO CAPS
2 MG | Freq: Once | ORAL | Status: AC
Start: 2016-09-02 — End: 2016-09-02
  Administered 2016-09-02: 23:00:00 4 mg via ORAL

## 2016-09-02 MED ORDER — LOPERAMIDE HCL 2 MG PO CAPS
2 MG | ORAL | Status: DC | PRN
Start: 2016-09-02 — End: 2016-09-08
  Administered 2016-09-04 – 2016-09-08 (×11): 2 mg via ORAL

## 2016-09-02 MED FILL — LORAZEPAM 1 MG PO TABS: 1 MG | ORAL | Qty: 1

## 2016-09-02 MED FILL — MORPHINE SULFATE 15 MG PO TABS: 15 mg | ORAL | Qty: 1

## 2016-09-02 MED FILL — LEVAQUIN 500 MG PO TABS: 500 MG | ORAL | Qty: 1

## 2016-09-02 MED FILL — SERTRALINE HCL 50 MG PO TABS: 50 MG | ORAL | Qty: 1

## 2016-09-02 MED FILL — KCL IN DEXTROSE-NACL 10-5-0.45 MEQ/L-%-% IV SOLN: INTRAVENOUS | Qty: 1000

## 2016-09-02 MED FILL — PANTOPRAZOLE SODIUM 40 MG PO TBEC: 40 MG | ORAL | Qty: 1

## 2016-09-02 MED FILL — LOVENOX 40 MG/0.4ML SC SOLN: 40 MG/0.4ML | SUBCUTANEOUS | Qty: 0.4

## 2016-09-02 MED FILL — GRANIX 300 MCG/0.5ML SC SOSY: 300 MCG/0.5ML | SUBCUTANEOUS | Qty: 0.5

## 2016-09-02 MED FILL — PROCHLORPERAZINE MALEATE 10 MG PO TABS: 10 MG | ORAL | Qty: 1

## 2016-09-02 MED FILL — FLUCONAZOLE 200 MG PO TABS: 200 MG | ORAL | Qty: 2

## 2016-09-02 MED FILL — VALACYCLOVIR HCL 500 MG PO TABS: 500 MG | ORAL | Qty: 1

## 2016-09-02 MED FILL — PROCHLORPERAZINE EDISYLATE 5 MG/ML IJ SOLN: 5 MG/ML | INTRAMUSCULAR | Qty: 2

## 2016-09-02 MED FILL — ORAMORPH SR 30 MG PO TBCR: 30 mg | ORAL | Qty: 1

## 2016-09-02 MED FILL — LOPERAMIDE HCL 2 MG PO CAPS: 2 MG | ORAL | Qty: 2

## 2016-09-02 MED FILL — BACLOFEN 10 MG PO TABS: 10 MG | ORAL | Qty: 2

## 2016-09-02 MED FILL — LISINOPRIL 10 MG PO TABS: 10 MG | ORAL | Qty: 1

## 2016-09-02 NOTE — Progress Notes (Signed)
[facility]  Autologous Progress Note    09/02/2016    David Watkins    DOB:  [birth year]    MRN:  [id number]    Referring MD: Eulis Canner, [place] [address]    Subjective:  Back pain improved, not eating or drinking very well     ECOG PS:  (1) Restricted in physically strenuous activity, ambulatory and able to do work of light nature    Isolation:  None     Medications    Scheduled Meds:  ??? levofloxacin  500 mg Oral Daily   ??? morphine  30 mg Oral BID   ??? baclofen  20 mg Oral TID   ??? sertraline  50 mg Oral Daily   ??? Tbo-Filgrastim  300 mcg Subcutaneous QPM   ??? enoxaparin  40 mg Subcutaneous Daily   ??? fluconazole  400 mg Oral Daily   ??? Saline Mouthwash  15 mL Swish & Spit 4x Daily AC & HS   ??? sodium chloride flush  10 mL Intravenous 2 times per day   ??? valACYclovir  500 mg Oral BID   ??? furosemide  40 mg Intravenous Q12H   ??? umeclidinium-vilanterol  1 puff Inhalation Daily   ??? cetirizine  10 mg Oral Daily   ??? lisinopril  10 mg Oral Daily   ??? pantoprazole  40 mg Oral QAM AC     Continuous Infusions:  ??? sodium chloride     ??? sodium chloride     ??? dextrose 5% and 0.45% NaCl with KCl 10 mEq 100 mL/hr at 09/01/16 2345     PRN Meds:.morphine, sodium chloride, sodium chloride, alteplase, magnesium hydroxide, magnesium sulfate, potassium chloride, Saline Mouthwash, prochlorperazine **OR** prochlorperazine, LORazepam **OR** LORazepam, albuterol sulfate HFA, polyethylene glycol, LORazepam    ROS:  ?? Constitutional: Denies fever, sweats, weight loss.  ?? Eyes: No visual changes or diplopia. No scleral icterus.  ?? ENT: No Headaches, hearing loss or vertigo. No mouth sores or sore throat.  ?? Cardiovascular: No chest pain, dyspnea on exertion, palpitations or loss of consciousness.   ?? Respiratory: No cough or wheezing, no sputum production. No hemoptysis.  ?? Gastrointestinal: No abdominal pain, appetite loss, blood in stools. No change in bowel habits.  ?? Genitourinary: No dysuria, trouble voiding, or  hematuria.  ?? Musculoskeletal:  No generalized weakness. No joint complaints.  ?? Integumentary: No rash or pruritis.  ?? Neurological: No headache, diplopia. No change in gait, balance, or coordination. No paresthesias.  ?? Endocrine: No temperature intolerance. No excessive thirst, fluid intake, or urination.   ?? Hematologic/Lymphatic: No abnormal bruising or ecchymoses, blood clots or swollen lymph nodes.  ?? Allergic/Immunologic: No nasal congestion or hives.     Physical Exam:     I&O:      Intake/Output Summary (Last 24 hours) at 09/02/16 0751  Last data filed at 09/02/16 3244   Gross per 24 hour   Intake             2779 ml   Output             2175 ml   Net              604 ml       Vital Signs:  BP 109/64    Pulse 85    Temp 98.5 ??F (36.9 ??C) (Oral)    Resp 18    Ht 5' 8.74" (1.746 m)  Wt 187 lb (84.8 kg)    SpO2 95%    BMI 27.82 kg/m??     Weight:    Wt Readings from Last 3 Encounters:   09/01/16 187 lb (84.8 kg)   08/17/16 192 lb (87.1 kg)   08/10/16 202 lb (91.6 kg)       General: Awake, alert and oriented ??  HEENT: normocephalic, alopecia, PERRL, no scleral erythema or icterus, Oral mucosa moist and intact, throat clear.   NECK: supple without palpable adenopathy  BACK: Straight, negative CVAT  SKIN: warm dry and intact without lesions rashes or masses  CHEST: CTA bilaterally without use of accessory muscles  CV: Normal S1 S2, RRR, no MRG  ABD: NT ND normoactive BS, no palpable masses or hepatosplenomegaly  EXTREMITIES: without edema, denies calf tenderness  NEURO: CN II - XII grossly intact  CATHETER: LSC Trifusion (Barrat, 08/17/16) - CDI, no erythema    Data:   CBC:   Recent Labs      08/31/16   0346  09/01/16   0313  09/02/16   0400   WBC  1.6*  0.9*  0.2*   HGB  10.5*  10.8*  10.6*   HCT  31.2*  31.3*  32.2*   MCV  87.4  86.6  87.4   PLT  183  142  106*     BMP/Mag:  Recent Labs      08/31/16   0346  09/01/16   0313  09/02/16   0400   NA  132*  139  141   K  4.1  4.4  4.1   CL  98*  104  105   CO2  _0 PHOS  3.3   --   3.2   BUN  _1 CREATININE  0.6*  0.6*  0.6*   MG  2.10   --    --      LIVP:   Recent Labs      08/31/16   0346  09/02/16   0400   AST  24  18   ALT  33  22   BILIDIR  <0.2  <0.2   BILITOT  0.4  0.3   ALKPHOS  80  77     Uric Acid:    Recent Labs      09/02/16   0400   LABURIC  3.1*     Coags:   Recent Labs      08/31/16   0340   PROTIME  11.6   INR  1.03   APTT  28.3     DIAGNOSTICS:   1. CT Thoracic & Lumbar Spine 08/31/16  Thoracic: Diffuse lytic skeletal metastatic disease compatible with  patient's clinical history of multiple myeloma, most pronounced at  the T6 and T10 vertebrae as detailed in body report. Potential  intraspinal involvement by metastatic disease at T6 and T10  levels, but no evidence for significant thecal sac compression on  noncontrast CT.  No fracture or thoracic spine infection.  Small right pleural effusion  Lumbar: Diffuse lytic skeletal metastatic disease compatible with  patient's clinical history of multiple myeloma. No fracture.  No fracture or lumbar spine infection  L5-S1 severe disc degeneration and grade I isthmic  spondylolisthesis contributing to severe bilateral L5 foraminal  stenosis.    PROBLEM LIST: ????????   ????  Multiple myeloma  Hyperlipidemia  HTN  Neoplasm related pain  ????  TREATMENT: ????????   ????  1. Rad Tx to T5-7, T10-L3, Right Scapula - 3000 cGy - Dr. Jamas Lav 03/20/16-04/01/16  2. RVD x1 03/20/16 - discontinued d/t rash   3. Velcade/Pomalyst/Dex x3 cycles 04/23/16-06/17/16 - discontinued d/t reaction to pomalyst  4. Velcade/Dex x 1 cycle 06/26/16 (last dose of dexamethasone 07/22/16  5. High-dose melphalan followed by administration of PBSCs 2.36 x10^6 cd34cells/kg on 08/28/16  ??  ASSESSMENT AND PLAN: ????????   ????  1.  IgG kappa multiple myeloma: PR after 5 cycles chemotherapy - M-spike is 0.4 & BM biopsy 2 % plasma cells  - Admitted for melphalan 235m/m2 preparative regimen followed by administration of PBSCs 2.36 x10^6 cd34cells/kg on 08/28/16.  He is NOT an early release candidate d/t his lack of social support and high anxiety    Day +5  ??  2.  ID:  Afebrile, no evidence of infection.    - acyclovir at discharge for VZV positive titer.    - Valtrex, Diflucan & Levaquin ppx  ??  3.  Heme:  Pancytopenia from chemotherapy  - Transfuse for Hgb < 7 and Platelets < 10K  - No transfusion today  - Start Granix 09/02/16  ??  4.  Metabolic:  HypoCa, other electrolytes are WNL and renal fxn stable  - IVF hydration: D51/2NS w/10KCl @ 1061mhr   - Replace potassium and magnesium per policy  - Monitor BG on AM labs  ??  5. GI/Nutrition:  Poor appetite & intake  - Cont low microbial diet   - Follow closely with dietary  - Encourage oral supplements  - GERD: Cont PPI  - Constipation: Miralax PRN    - majority of encounter today focused on eating and drinking better - examples provided  ??  6. Pain: Chronic back & rib pain s/p radtx; acute flare of back pain  - MS contin, up to 30 mg BID, change to morphine 15 mg q 4 and added baclofen 20 tid  - Follows with pain specialist as outpt  - CT 08/31/16: no new pathology  ??  7. 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  8. Chronic Bronchitis/COPD: As evidenced by pre-transplant PFTs - FEV1 is 43%, He did respond to bronchodilators and went up to 52%  - Cont albuterol PRN  - Cont anoro ellipta inhaler  - Cleared for transplant by Dr. MuPercell Miller??  9. Cardiac: h/o HLD. 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 - Cleared for BMT by Dr. WhEliane Decree- HTN: Lisinopril 1061maily  ??  10. Anxiety/Depression  - Ativan PRN  - Dr. SonJavier Dockernsulted  - zoloft ordered, causing fatigue; changed to at night - better  ??  11. Insomnia:  - Ativan PRN qHS - some of this due to pain also      - DVT Prophylaxis: Platelets >50,000 cells/dL, - daily lovenox prophylaxis  ordered  Contraindications to pharmacologic prophylaxis: None  Contraindications to mechanical prophylaxis: None  ??  - Disposition:  Will be discharged after transplant when blood counts have recovered and he is afebrile and eating and drinking well.   ??      BriMelvyn NethNP        Terina Mcelhinny A. FabDrucilla SchmidtO, MS

## 2016-09-02 NOTE — Plan of Care (Signed)
Problem: Infection - Central Venous Catheter-Associated Bloodstream Infection:  Goal: Will show no infection signs and symptoms  Will show no infection signs and symptoms   Outcome: Ongoing  CVC site remains free of signs/symptoms of infection. No drainage, edema, erythema, pain, or warmth noted at site. Dressing changes continue per protocol and on an as needed basis - see flowsheet.     Refusing BCC Bath Protocol:  Despite multiple attempts by this RN, pt refusing shower or bed bath with CHG today.  Discussed risks associated with not following BCC bath protocol including increased risk of CVC line infection & sepsis in an immunocompromised pt.  Will discuss continued refusal with treatment team if pt continues to refuse daily bath protocol for 2 or more days.  CVC site cleansed with CHG wipe over dressing, skin surrounding dressing, and first 6" of IV tubing.  Pt tolerated well.  Continued to encourage daily CHG bathing per [facility] protocol.  Patient states he will shower this evening.     Problem: PROTECTIVE PRECAUTIONS  Goal: Patient will remain free of nosocomial Infections  Outcome: Ongoing  Patient remains afebrile throughout shift. Patient continues neutropenic precautions.     Problem: Pain:  Goal: Control of acute pain  Control of acute pain   Outcome: Ongoing  Patient reports chronic back pain - rates it 7/10. Patient tolerating schedule PO morphine for pain. Patient requested IR Morphrine x1 during this RN's shift. Patient states his pain does not completely go away, but he is satisfied with pain medication regimen.     Problem: Falls - Risk of:  Goal: Will remain free from falls  Will remain free from falls   Outcome: Ongoing  Orthostatic vital signs obtained at start of shift - see flowsheet for details.  Pt does not meet criteria for orthostasis.  Pt is a Med fall risk. See Leamon Arnt Fall Score and ABCDS Injury Risk assessments.     - Screening for Orthostasis AND not a High Falls Risk per MORSE/ABCDS: Pt  bed is in low position, side rails up, call light and belongings are in reach.  Fall risk light is on outside pts room.  Pt encouraged to call for assistance as needed. Will continue with hourly rounds for PO intake, pain needs, toileting and repositioning as needed.     Problem: Nausea/Vomiting:  Goal: Absence of nausea/vomiting  Absence of nausea/vomiting   Outcome: Ongoing  Patient had one episode of watery emesis. Patient continues Q4 Compazine for nausea. RN administered anti-emetic 1 hour prior to giving morning medications. Patient tolerated PO medications without incident.     Problem: Nutrition Deficit:  Goal: Ability to achieve adequate nutritional intake will improve  Ability to achieve adequate nutritional intake will improve   Outcome: Ongoing  Patient reports extreme loss of appetite. Patient tolerated 1 ensure for breakfast. RN will continue to monitor patient for increased intake.     Problem: Bleeding:  Goal: Will show no signs and symptoms of excessive bleeding  Will show no signs and symptoms of excessive bleeding   Outcome: Ongoing  Patient's hemoglobin this AM:   Recent Labs      09/02/16   0400   HGB  10.6*     Patient's platelet count this AM:   Recent Labs      09/02/16   0400   PLT  106*    Thrombocytopenia not present at this time.  Patient showing no signs or symptoms of active bleeding.  Transfusion not indicated at this  time.  Patient verbalizes understanding of all instructions. Will continue to assess and implement POC. Call light within reach and hourly rounding in place.     Problem: Venous Thromboembolism:  Goal: Will show no signs or symptoms of venous thromboembolism  Will show no signs or symptoms of venous thromboembolism   Outcome: Ongoing  Adherent with DVT Prevention: Pt is at risk for DVT d/t decreased mobility and cancer treatment.  Pt educated on importance of activity.  Pt has orders for Subcut prophylactic lovenox.  Pt verbalizes understanding of need for prophylaxis  while inpatient.

## 2016-09-02 NOTE — Care Coordination-Inpatient (Signed)
Type of Admission  Multiple Myeloma IgG  Melphalan Auto ( T:0: 08/28/16)  Day +5        Central venous catheter  Left SC Trifusion Catheter ( 08/17/16, Dr. Lizbeth Bark)        Plan  Proceed with Melphalan Auto SCT for treatment of Multiple Myeloma        Update  09/03/15:  Will begin daily Granix today. Reports having no appetite but still attempting.          Education  08/26/16: Introduced myself in Clinician/Naviagtor role. Discussed use of cryotherapy with Melphalan, verbalized understanding.  Reviewed timeline of recovery, states that he has a calendar that his brothers & sisters are assisting post discharge, all if his siblings live out of town.  Confirmed local [facility]  09/02/16:  Discussed use of Granix  To assist in wbc recovery.  Reinforced timeline for recovery, days 11-14 & criteria for discharge, afebrile, ANC recovering & adequate nutrition.        Discharge  When [place] recovers, afebrile & adequate nutrition        Pending

## 2016-09-02 NOTE — Plan of Care (Signed)
Problem: Infection - Central Venous Catheter-Associated Bloodstream Infection:  Goal: Will show no infection signs and symptoms  Will show no infection signs and symptoms   Outcome: Met This Shift  CVC site remains free of signs/symptoms of infection. No drainage, edema, erythema, pain, or warmth noted at site. Dressing changes continue per protocol and on an as needed basis - see flowsheet.     Compliant with [facility]CC Bath Protocol:  Performed CHG bath today per BCC protocol utilizing CHG solution in the shower.  CVC site cleansed with CHG wipe over dressing, skin surrounding dressing, and first 6" of IV tubing.  Pt tolerated well.  Continued to encourage daily CHG bathing per [facility] protocol.      Problem: PROTECTIVE PRECAUTIONS  Goal: Patient will remain free of nosocomial Infections  Outcome: Met This Shift  Pt remains in neutropenic isolation. Pt and family aware of precautions. Pt following low microbial diet. Pt remaining afebrile, denies chills. Will continue to monitor.    Problem: Pain:  Goal: Pain level will decrease  Pain level will decrease   Outcome: Ongoing  Pt having ongoing back pain. PRN and scheduled morphine administered as directed.     Problem: Falls - Risk of:  Goal: Will remain free from falls  Will remain free from falls   Outcome: Met This Shift  Orthostatic vital signs obtained at start of shift - see flowsheet for details.  Pt does not meet criteria for orthostasis.  Pt is a Med fall risk. See Leamon Arnt Fall Score and ABCDS Injury Risk assessments.     - Screening for Orthostasis AND not a High Falls Risk per MORSE/ABCDS: Pt bed is in low position, side rails up, call light and belongings are in reach.  Fall risk light is on outside pts room.  Pt encouraged to call for assistance as needed. Will continue with hourly rounds for PO intake, pain needs, toileting and repositioning as needed.     Problem: Nausea/Vomiting:  Goal: Absence of nausea/vomiting  Absence of nausea/vomiting   Outcome: Met This  Shift  No complaints of nausea or vomiting.     Problem: Nutrition Deficit:  Goal: Ability to achieve adequate nutritional intake will improve  Ability to achieve adequate nutritional intake will improve   Outcome: Ongoing  Pt declines any supplements this shift.     Problem: Venous Thromboembolism:  Goal: Will show no signs or symptoms of venous thromboembolism  Will show no signs or symptoms of venous thromboembolism   Outcome: Met This Shift  Adherent with DVT Prevention: Pt is at risk for DVT d/t decreased mobility and cancer treatment.  Pt educated on importance of activity.  Pt has orders for Subcut prophylactic lovenox.  Pt verbalizes understanding of need for prophylaxis while inpatient.

## 2016-09-02 NOTE — Plan of Care (Signed)
Problem: Bleeding:  Goal: Will show no signs and symptoms of excessive bleeding  Will show no signs and symptoms of excessive bleeding   Outcome: Met This Shift  Patient's hemoglobin this AM:   Recent Labs      09/02/16   0400   HGB  10.6*     Patient's platelet count this AM:   Recent Labs      09/02/16   0400   PLT  106*    Thrombocytopenia Precautions in place.  Patient showing no signs or symptoms of active bleeding.  Transfusion not indicated at this time.  Patient verbalizes understanding of all instructions. Will continue to assess and implement POC. Call light within reach and hourly rounding in place.

## 2016-09-03 LAB — CBC WITH AUTO DIFFERENTIAL
Hematocrit: 30.6 % — ABNORMAL LOW (ref 40.5–52.5)
Hemoglobin: 10.2 g/dL — ABNORMAL LOW (ref 13.5–17.5)
MCH: 29.1 pg (ref 26.0–34.0)
MCHC: 33.3 g/dL (ref 31.0–36.0)
MCV: 87.3 fL (ref 80.0–100.0)
MPV: 6.7 fL (ref 5.0–10.5)
Platelets: 67 10*3/uL — ABNORMAL LOW (ref 135–450)
RBC: 3.5 M/uL — ABNORMAL LOW (ref 4.20–5.90)
RDW: 13.7 % (ref 12.4–15.4)
WBC: 0.1 10*3/uL — CL (ref 4.0–11.0)

## 2016-09-03 LAB — BASIC METABOLIC PANEL
Anion Gap: 11 (ref 3–16)
BUN: 8 mg/dL (ref 7–20)
CO2: 24 mmol/L (ref 21–32)
Calcium: 8.1 mg/dL — ABNORMAL LOW (ref 8.3–10.6)
Chloride: 104 mmol/L (ref 99–110)
Creatinine: 0.6 mg/dL — ABNORMAL LOW (ref 0.9–1.3)
GFR African American: >60 (ref >60)
GFR Non-African American: >60 (ref >60)
Glucose: 116 mg/dL — ABNORMAL HIGH (ref 70–99)
Potassium: 3.9 mmol/L (ref 3.5–5.1)
Sodium: 139 mmol/L (ref 136–145)

## 2016-09-03 LAB — URIC ACID: Uric Acid, Serum: 2.7 mg/dL — ABNORMAL LOW (ref 3.5–7.2)

## 2016-09-03 LAB — PROTIME-INR
INR: 1.12 (ref 0.85–1.15)
Protime: 12.6 s (ref 9.6–13.0)

## 2016-09-03 LAB — APTT: aPTT: 29.9 s (ref 24.1–34.9)

## 2016-09-03 LAB — MAGNESIUM: Magnesium: 2.1 mg/dL (ref 1.80–2.40)

## 2016-09-03 LAB — CULTURE, VRE: VRE Culture: NEGATIVE

## 2016-09-03 MED FILL — VALACYCLOVIR HCL 500 MG PO TABS: 500 MG | ORAL | Qty: 1

## 2016-09-03 MED FILL — LEVAQUIN 500 MG PO TABS: 500 MG | ORAL | Qty: 1

## 2016-09-03 MED FILL — BACLOFEN 10 MG PO TABS: 10 MG | ORAL | Qty: 2

## 2016-09-03 MED FILL — LORAZEPAM 0.5 MG PO TABS: 0.5 MG | ORAL | Qty: 1

## 2016-09-03 MED FILL — SERTRALINE HCL 50 MG PO TABS: 50 MG | ORAL | Qty: 1

## 2016-09-03 MED FILL — KCL IN DEXTROSE-NACL 10-5-0.45 MEQ/L-%-% IV SOLN: INTRAVENOUS | Qty: 1000

## 2016-09-03 MED FILL — LOVENOX 40 MG/0.4ML SC SOLN: 40 MG/0.4ML | SUBCUTANEOUS | Qty: 0.4

## 2016-09-03 MED FILL — PANTOPRAZOLE SODIUM 40 MG PO TBEC: 40 MG | ORAL | Qty: 1

## 2016-09-03 MED FILL — ORAMORPH SR 30 MG PO TBCR: 30 mg | ORAL | Qty: 1

## 2016-09-03 MED FILL — GRANIX 300 MCG/0.5ML SC SOSY: 300 MCG/0.5ML | SUBCUTANEOUS | Qty: 0.5

## 2016-09-03 MED FILL — PROCHLORPERAZINE EDISYLATE 5 MG/ML IJ SOLN: 5 MG/ML | INTRAMUSCULAR | Qty: 2

## 2016-09-03 MED FILL — LISINOPRIL 10 MG PO TABS: 10 MG | ORAL | Qty: 1

## 2016-09-03 MED FILL — FLUCONAZOLE 200 MG PO TABS: 200 MG | ORAL | Qty: 2

## 2016-09-03 MED FILL — CETIRIZINE HCL 10 MG PO TABS: 10 MG | ORAL | Qty: 1

## 2016-09-03 MED FILL — MORPHINE SULFATE 15 MG PO TABS: 15 mg | ORAL | Qty: 1

## 2016-09-03 NOTE — Plan of Care (Signed)
Problem: Nutrition  Goal: Optimal nutrition therapy  Outcome: Ongoing  Nutrition Problem: Inadequate oral intake  Intervention: Food and/or Nutrient Delivery: Continue current diet, Continue current ONS  Nutritional Goals: pt will tolerate and consume 50% or more of all meals and supplements offered

## 2016-09-03 NOTE — Progress Notes (Signed)
Occupational Therapy   Occupational Therapy Initial Assessment, Treatment, Discharge  Date: 09/03/2016   Patient Name: David Watkins  MRN: [id number]     DOB: 11/05/1955    Patient Diagnosis(es): There were no encounter diagnoses.     has a past medical history of Hyperlipidemia; Hypertension; and Multiple myeloma ([place]).   has a past surgical history that includes bone marrow biopsy and other surgical history (Left, 08/17/2016).    Treatment Diagnosis: Impaired ADLs/transfers and mobility      Restrictions  Position Activity Restriction  Other position/activity restrictions: If platelet count equal to or less than 10 but the patient has received a platelet transfusion, physical therapy or occupational therapy may proceed with treatment. Up as tolerated    Subjective   General  Chart Reviewed: Yes  Additional Pertinent Hx: 61 y.o. M admitted 12/27 in prep for autologous stem cell transplant on 12/29. Pt w/ dx of multiple myeloma of July this year. PMHx= hyperlipidemia, HTN, MM  Family / Caregiver Present: No  Diagnosis: Multiple myeloma  Subjective  Subjective: Pt sitting eob upon entry; agreeable to OT.   Pain Assessment  Patient Currently in Pain: Yes (R lower back pain, 7/10, RN aware)    Social/Functional History  Social/Functional History  Lives With:  (2 kids (high school age))  Type of Home: House  Home Layout: Bed/Bath upstairs, 1/2 bath on main level (18 steps up to bed/full bath, laundry on main level)  Home Access: Stairs to enter without rails (2-3 STE)  Bathroom Shower/Tub: Tourist information centre manager: Soil scientist: Higher education careers adviser:  (none)  ADL Assistance: Independent  Homemaking Assistance: Independent (housecleaning assist once every 2 weeks, does own laundry)  Ambulation Assistance: Independent  Transfer Assistance: Independent  Active Driver: Yes  Occupation:  (hasn't worked since December 1)  Additional Comments: Denies h/o falls     Objective  Treatment  included functional transfer training, ADLs, and patient education.   Vision: Within Functional Limits  Hearing: Within functional limits    Orientation  Overall Orientation Status: Within Functional Limits     Balance  Sitting Balance: Independent  Standing Balance: Independent  Standing Balance  Activity: functional mobility in room/restroom/hallway, ADLs in stance, transfers   Sit to stand: Independent  Stand to sit: Independent  Functional Mobility  Functional - Mobility Device: No device  Assist Level: Independent  Functional Mobility Comments: Pt reports he has been using IV pole for stability, did not use during session to simulate home environment  Toilet Transfers  Toilet - Technique: Ambulating  Equipment Used: Standard toilet  Toilet Transfer: Independent  ADL  Feeding: Independent  Grooming: Independent  LE Dressing: Independent (to don socks)  Toileting: Independent  Additional Comments: Pt has been getting up to use restroom independently per pt       Transfers  Sit to stand: Independent  Stand to sit: Independent     Cognition  Overall Cognitive Status: WFL     LUE AROM (degrees)  LUE AROM : WFL  RUE AROM (degrees)  RUE AROM : WFL  LUE Strength  Gross LUE Strength: WFL  RUE Strength  Gross RUE Strength: WFL     Assessment   Performance deficits / Impairments: Decreased functional mobility ;Decreased ADL status;Decreased endurance  Assessment: Pt's overall functional independence appears near baseline. Pt has been getting up independently within the room. Pt's main complaint is R sided back pain which limits ability to walk further distances. Encouraged pt  to complete various stretching exercises- pt was given handout, as well as continuing to walk halls as tolerated. No further OT needs identified. Will sign off. Please re-order if any additional needs arise prior to d/c.   Treatment Diagnosis: Impaired ADLs/transfers and mobility  Prognosis: Good  Decision Making: Low Complexity  Patient Education:  Role of OT, activity promotion; pt verb understanding  REQUIRES OT FOLLOW UP: No  Activity Tolerance  Activity Tolerance: Patient Tolerated treatment well  Safety Devices  Safety Devices in place: Yes  Type of devices: Left in chair;Nurse notified;Call light within reach. Pt was issued ice packs to help w/ pain in R sided back area   OT Equipment Recommendations  Equipment Needed: No      During  []  PT [x]  OT session, pt was identified as a:   []  High  [x]  Medium []  Low fall risk using the [facility] Fall Risk Assessment tool.    It is recommended:   [x]  Continue  []  Change Patient to:   []  High  [x]   Medium  []  Low  Fall risk.    Documentation was completed on the Fall Risk Navigator and this staff member confirmed that all appropriate fall risk protocols/practices are implemented.     Discharge Recommendations:    HILLERY BHALLA scored a 24/24 on the AM-PAC ADL Inpatient form. Current research shows that an AM-PAC score of 18 or greater is associated with a discharge to patient's home setting.   AM-PAC Score     AM-PAC Inpatient Daily Activity Raw Score: 24  AM-PAC Inpatient ADL T-Scale Score : 57.54  ADL Inpatient CMS 0-100% Score: 0  ADL Inpatient CMS G-Code Modifier : [place]    Goals  Patient Goals   Patient goals : To return home at d/c        Therapy Time   Individual Concurrent Group Co-treatment   Time In 0855         Time Out 0940         Minutes 45         Timed Code Treatment Minutes:   30    Total Treatment Minutes:  [address] [place]

## 2016-09-03 NOTE — Plan of Care (Signed)
Problem: Infection - Central Venous Catheter-Associated Bloodstream Infection:  Goal: Will show no infection signs and symptoms  Will show no infection signs and symptoms   Outcome: Met This Shift  CVC site remains free of signs/symptoms of infection. No drainage, edema, erythema, pain, or warmth noted at site. Dressing changes continue per protocol and on an as needed basis - see flowsheet.     Pt states he showers at night.    Problem: PROTECTIVE PRECAUTIONS  Goal: Patient will remain free of nosocomial Infections  Outcome: Met This Shift  Pt remains in protective precautions.  Pt educated on wearing mask when in hallways. Pt, staff, and visitors adhering to handwashing guidelines. Pt educated to shower or bathe daily with chlorhexidine and linens changed daily per protocol. Pt verbalizes understanding of low microbial diet. Will continue to monitor.     Problem: Pain:  Goal: Pain level will decrease  Pain level will decrease   Outcome: Met This Shift  Chronic back pain, scheduled MSCONTIN administered.    Problem: Falls - Risk of:  Goal: Will remain free from falls  Will remain free from falls   Outcome: Met This Shift  Orthostatic vital signs obtained at start of shift - see flowsheet for details.  Pt does not meet criteria for orthostasis.  Pt is a Med fall risk. See Lattie Corns Fall Score and ABCDS Injury Risk assessments.   - Screening for Orthostasis AND not a High Falls Risk per MORSE/ABCDS: Pt bed is in low position, side rails up, call light and belongings are in reach.  Fall risk light is on outside pts room.  Pt encouraged to call for assistance as needed. Will continue with hourly rounds for PO intake, pain needs, toileting and repositioning as needed.     Problem: Nausea/Vomiting:  Goal: Absence of nausea/vomiting  Absence of nausea/vomiting   Outcome: Ongoing  Continues with nausea and some dry heaving.  Antiemetics administered and pt is resting quietly.    Problem: Nutrition Deficit:  Goal: Ability to  achieve adequate nutritional intake will improve  Ability to achieve adequate nutritional intake will improve   Outcome: Ongoing  Pt refusing to eat at this time.    Problem: Bleeding:  Goal: Will show no signs and symptoms of excessive bleeding  Will show no signs and symptoms of excessive bleeding   Outcome: Met This Shift  Patient's hemoglobin this AM:   Recent Labs      09/03/16   0355   HGB  10.2*     Patient's platelet count this AM:   Recent Labs      09/03/16   0355   PLT  67*    Thrombocytopenia Precautions in place.  Patient showing no signs or symptoms of active bleeding.  Transfusion not indicated at this time.  Patient verbalizes understanding of all instructions. Will continue to assess and implement POC. Call light within reach and hourly rounding in place.     Problem: Venous Thromboembolism:  Goal: Will show no signs or symptoms of venous thromboembolism  Will show no signs or symptoms of venous thromboembolism   Outcome: Ongoing  Adherent with DVT Prevention: Pt is at risk for DVT d/t decreased mobility and cancer treatment.  Pt educated on importance of activity.  Pt has orders for Subcut prophylactic lovenox.  Pt verbalizes understanding of need for prophylaxis while inpatient.

## 2016-09-03 NOTE — Progress Notes (Addendum)
[facility]  Autologous Progress Note    09/03/2016    David Watkins    DOB:  07-10-1956    MRN:  [id number]    Referring MD: Eulis Canner, [place] [address]    Subjective:  Not eating much at all     ECOG PS:  (1) Restricted in physically strenuous activity, ambulatory and able to do work of light nature    Isolation:  None     Medications    Scheduled Meds:  ??? levofloxacin  500 mg Oral Daily   ??? morphine  30 mg Oral BID   ??? baclofen  20 mg Oral TID   ??? sertraline  50 mg Oral Daily   ??? Tbo-Filgrastim  300 mcg Subcutaneous QPM   ??? enoxaparin  40 mg Subcutaneous Daily   ??? fluconazole  400 mg Oral Daily   ??? Saline Mouthwash  15 mL Swish & Spit 4x Daily AC & HS   ??? sodium chloride flush  10 mL Intravenous 2 times per day   ??? valACYclovir  500 mg Oral BID   ??? furosemide  40 mg Intravenous Q12H   ??? umeclidinium-vilanterol  1 puff Inhalation Daily   ??? cetirizine  10 mg Oral Daily   ??? lisinopril  10 mg Oral Daily   ??? pantoprazole  40 mg Oral QAM AC     Continuous Infusions:  ??? sodium chloride     ??? sodium chloride     ??? dextrose 5% and 0.45% NaCl with KCl 10 mEq 100 mL/hr at 09/03/16 0528     PRN Meds:.[COMPLETED] loperamide **FOLLOWED BY** loperamide, morphine, sodium chloride, sodium chloride, alteplase, magnesium hydroxide, magnesium sulfate, potassium chloride, Saline Mouthwash, prochlorperazine **OR** prochlorperazine, LORazepam **OR** LORazepam, albuterol sulfate HFA, polyethylene glycol, LORazepam    ROS:  ?? Constitutional: Denies fever, sweats, weight loss.  ?? Eyes: No visual changes or diplopia. No scleral icterus.  ?? ENT: No Headaches, hearing loss or vertigo. No mouth sores or sore throat.  ?? Cardiovascular: No chest pain, dyspnea on exertion, palpitations or loss of consciousness.   ?? Respiratory: No cough or wheezing, no sputum production. No hemoptysis.  ?? Gastrointestinal: No abdominal pain, no blood in stools. +Diarrhea.  +Appetite loss  ?? Genitourinary: No dysuria, trouble  voiding, or hematuria.  ?? Musculoskeletal:  +generalized weakness & back pain. No joint complaints.  ?? Integumentary: No rash or pruritis.  ?? Neurological: No headache, diplopia. No change in gait, balance, or coordination. No paresthesias.  ?? Endocrine: No temperature intolerance. No excessive thirst, fluid intake, or urination.   ?? Hematologic/Lymphatic: No abnormal bruising or ecchymoses, blood clots or swollen lymph nodes.  ?? Allergic/Immunologic: No nasal congestion or hives.     Physical Exam:     I&O:      Intake/Output Summary (Last 24 hours) at 09/03/16 0702  Last data filed at 09/03/16 0541   Gross per 24 hour   Intake             2833 ml   Output             3375 ml   Net             -542 ml       Vital Signs:  BP 113/69    Pulse 87    Temp 98.2 ??F (36.8 ??C) (Oral)    Resp 16    Ht 5' 8.74" (1.746 m)  Wt 185 lb (83.9 kg)    SpO2 96%    BMI 27.53 kg/m??     Weight:    Wt Readings from Last 3 Encounters:   09/02/16 185 lb (83.9 kg)   08/17/16 192 lb (87.1 kg)   08/10/16 202 lb (91.6 kg)       General: Awake, alert and oriented ??  HEENT: normocephalic, alopecia, PERRL, no scleral erythema or icterus, Oral mucosa moist and intact, throat clear.   NECK: supple without palpable adenopathy  BACK: Straight, negative CVAT  SKIN: warm dry and intact without lesions rashes or masses  CHEST: CTA bilaterally without use of accessory muscles  CV: Normal S1 S2, RRR, no MRG  ABD: NT ND normoactive BS, no palpable masses or hepatosplenomegaly  EXTREMITIES: without edema, denies calf tenderness  NEURO: CN II - XII grossly intact  CATHETER: LSC Trifusion ([place], 08/17/16) - CDI, no erythema    Data:   CBC:   Recent Labs      09/01/16   0313  09/02/16   0400  09/03/16   0355   WBC  0.9*  0.2*  0.1*   HGB  10.8*  10.6*  10.2*   HCT  31.3*  32.2*  30.6*   MCV  86.6  87.4  87.3   PLT  142  106*  67*     BMP/Mag:  Recent Labs      09/01/16   0313  09/02/16   0400  09/03/16   0355   NA  139  141  139   K  4.4  4.1  3.9   CL   104  105  104   CO2  25  24  24    PHOS   --   3.2   --    BUN  10  10  8    CREATININE  0.6*  0.6*  0.6*   MG   --    --   2.10     LIVP:   Recent Labs      09/02/16   0400   AST  18   ALT  22   BILIDIR  <0.2   BILITOT  0.3   ALKPHOS  77     Uric Acid:    Recent Labs      09/03/16   0355   LABURIC  2.7*     Coags:   Recent Labs      09/03/16   0355   PROTIME  12.6   INR  1.12   APTT  29.9     DIAGNOSTICS:   1. CT Thoracic & Lumbar Spine 08/31/16  Thoracic: Diffuse lytic skeletal metastatic disease compatible with  patient's clinical history of multiple myeloma, most pronounced at  the T6 and T10 vertebrae as detailed in body report. Potential  intraspinal involvement by metastatic disease at T6 and T10  levels, but no evidence for significant thecal sac compression on  noncontrast CT.  No fracture or thoracic spine infection.  Small right pleural effusion  Lumbar: Diffuse lytic skeletal metastatic disease compatible with  patient's clinical history of multiple myeloma. No fracture.  No fracture or lumbar spine infection  L5-S1 severe disc degeneration and grade I isthmic  spondylolisthesis contributing to severe bilateral L5 foraminal  stenosis.    PROBLEM LIST: ????????   ????  Multiple myeloma  Hyperlipidemia  HTN  Neoplasm related pain    Post-Transplant Complications:  1. Anorexia  ????  TREATMENT: ????????   ????  1. Rad Tx to T5-7, T10-L3, Right Scapula - 3000 cGy - Dr. Jamas Lav 03/20/16-04/01/16  2. RVD x1 03/20/16 - discontinued d/t rash   3. Velcade/Pomalyst/Dex x3 cycles 04/23/16-06/17/16 - discontinued d/t reaction to pomalyst  4. Velcade/Dex x 1 cycle 06/26/16 (last dose of dexamethasone 07/22/16  5. High-dose melphalan followed by administration of PBSCs 2.36 x10^6 cd34cells/kg on 08/28/16  ??  ASSESSMENT AND PLAN: ????????   ????  1.  IgG kappa multiple myeloma: PR after 5 cycles chemotherapy - M-spike is 0.4 & BM biopsy 2 % plasma cells  - Admitted for melphalan 236m/m2 preparative regimen followed by administration of PBSCs 2.36  x10^6 cd34cells/kg on 08/28/16. He is NOT an early release candidate d/t his lack of social support and high anxiety    Day +6  ??  2.  ID:  Afebrile, no evidence of infection.    - Acyclovir at discharge for VZV positive titer.    - Valtrex, Diflucan & Levaquin ppx  ??  3.  Heme:  Pancytopenia from chemotherapy  - Transfuse for Hgb < 7 and Platelets < 10K  - No transfusion today  - Start Granix 09/02/16  ??  4.  Metabolic:  HypoCa, other electrolytes are WNL and renal fxn stable. Wt down significantly since admission  - Cont IVF hydration: D51/2NS w/10KCl @ 1066mhr   - Replace potassium and magnesium per orders  ??  5. GI/Nutrition:  Poor appetite & intake. Now with diarrhea  - Cont low microbial diet - this needs to improve; most of encounter today spent to review changes/choices for calories  - Follow closely with dietary - reconsult to help encourage PO intake  - Encourage oral supplements - multiple discussions with pt about attempting oral intake have been had  - GERD: Cont PPI  - Diarrhea: CDiff neg 09/02/16, Imodium PRN  ??  6. Pain: Chronic back & rib pain s/p radtx; acute flare of back pain  - MS contin, up to 30 mg BID, change to morphine 15 mg q 4 and added baclofen 20 tid  - Follows with pain specialist as outpt  - CT 08/31/16: no new pathology  ??  7. 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  8. Chronic Bronchitis/COPD: As evidenced by pre-transplant PFTs - FEV1 is 43%, He did respond to bronchodilators and went up to 52%  - Cont albuterol PRN  - Cont anoro ellipta inhaler  - Cleared for transplant by Dr. MuPercell Miller??  9. Cardiac: h/o HLD. 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 - Cleared for BMT by Dr. WhEliane Decree- HTN: Lisinopril 1025maily  ??  10. Anxiety/Depression  - Ativan PRN  - Dr. SonJavier Dockernsulted  - Started Zoloft qhs this  admit  ??  11. Insomnia:  - Ativan PRN qHS    - DVT Prophylaxis: Platelets >50,000 cells/dL, - daily lovenox prophylaxis ordered  Contraindications to pharmacologic prophylaxis: None  Contraindications to mechanical prophylaxis: None  ??  - Disposition:  Will be discharged after transplant when blood counts have recovered and he is afebrile and eating and drinking well.   ??    BriMelvyn NethNP      Mercedes Valeriano A. FabDrucilla SchmidtO, MS

## 2016-09-03 NOTE — Care Coordination-Inpatient (Signed)
SW stayed after rounds to talk with patient.  He was glad to work with therapy this morning.  He talked about not being able to eat.  SW reinforced what physician stated about smaller meals throughout the day.  Patient states he tries he is just having trouble with this.  Support given.    Freddi Starr, MSW, [place]

## 2016-09-03 NOTE — Plan of Care (Addendum)
Problem: Infection - Central Venous Catheter-Associated Bloodstream Infection:  Goal: Will show no infection signs and symptoms  Will show no infection signs and symptoms   Outcome: Ongoing  CVC site remains free of signs/symptoms of infection. No drainage, edema, erythema, pain, or warmth noted at site. Dressing changes continue per protocol and on an as needed basis - see flowsheet.     Compliant with [facility] Bath Protocol:  Performed CHG bath today per BCC protocol utilizing CHG solution in the shower.  CVC site cleansed with CHG wipe over dressing, skin surrounding dressing, and first 6" of IV tubing.  Pt tolerated well.  Continued to encourage daily CHG bathing per [facility] protocol.    Problem: PROTECTIVE PRECAUTIONS  Goal: Patient will remain free of nosocomial Infections  Outcome: Ongoing  Pt remains in protective precautions. No living plants or fresh flowers in his/her room. Patient educated on wearing mask when in hallways. Patient, staff, and visitors adhering to handwashing guidelines. Patient cleansed with chlorhexidine wipes and linens changed daily per protocol. Pt verbalizes understanding of low microbial diet. Patient remains free of nosocomial infections.      Problem: Pain:  Goal: Pain level will decrease  Pain level will decrease   Outcome: Ongoing  Pt continues to complain of chronic lower back pain. Pt has MS Contin and Baclofen scheduled and Morphine PO as needed for pain. Pt slept throughout the night with minimal complaints.    Problem: Falls - Risk of:  Goal: Will remain free from falls  Will remain free from falls   Outcome: Ongoing  Orthostatic vital signs obtained at start of shift - see flowsheet for details.  Pt does not meet criteria for orthostasis.  Pt is a Med fall risk. See Leamon Arnt Fall Score and ABCDS Injury Risk assessments.     - Screening for Orthostasis AND not a High Falls Risk per MORSE/ABCDS: Pt bed is in low position, side rails up, call light and belongings are in reach.  Fall  risk light is on outside pts room.  Pt encouraged to call for assistance as needed. Will continue with hourly rounds for PO intake, pain needs, toileting and repositioning as needed.     Problem: Nausea/Vomiting:  Goal: Absence of nausea/vomiting  Absence of nausea/vomiting   Outcome: Ongoing  Pt had one episode of nausea during the shift. Pt denied any medication and the nausea dissipated after a couple of minutes. Will continue to monitor.     Problem: Bleeding:  Goal: Will show no signs and symptoms of excessive bleeding  Will show no signs and symptoms of excessive bleeding   Outcome: Ongoing  Patient's hemoglobin this AM:   Recent Labs      09/03/16   0355   HGB  10.2*     Patient's platelet count this AM:   Recent Labs      09/03/16   0355   PLT  67*    Thrombocytopenia Precautions in place.  Patient showing no signs or symptoms of active bleeding.  Transfusion not indicated at this time.  Patient verbalizes understanding of all instructions. Will continue to assess and implement POC. Call light within reach and hourly rounding in place.     Problem: Venous Thromboembolism:  Goal: Will show no signs or symptoms of venous thromboembolism  Will show no signs or symptoms of venous thromboembolism   Outcome: Ongoing  Adherent with DVT Prevention: Pt is at risk for DVT d/t decreased mobility and cancer treatment.  Pt educated on  importance of activity.  Pt has orders for Subcut prophylactic lovenox.  Pt verbalizes understanding of need for prophylaxis while inpatient.

## 2016-09-03 NOTE — Consults (Signed)
Nutrition Assessment    Type and Reason for Visit: Consult, Reassess    Nutrition Recommendations:   ?? Continue Low Microbial diet, monitor and encourage intake   ?? Continue Ensure Enlive, please allow pt to have PRN, encourage intake   ?? Continue to encourage bland foods and offer foods to pt from nutrition room.     Malnutrition Assessment:  ?? Malnutrition Status: Mild Malnutrition  ?? Context: Chronic illness  ?? Findings of the 6 clinical characteristics of malnutrition (Minimum of 2 out of 6 clinical characteristics is required to make the diagnosis of moderate or severe Protein Calorie Malnutrition based on AND/ASPEN Guidelines):  1. Energy Intake-Less than or equal to 50%,  (4 1/2 days )    2. Weight Loss-5% loss or greater, in 1 week (likely fluid and nutritionally related - difficult to assess)  3. Fat Loss-No significant subcutaneous fat loss,    4. Muscle Loss-Mild muscle mass loss, Thigh (quadriceps)  5. Fluid Accumulation-No significant fluid accumulation,    6. Grip Strength-Not measured    Nutrition Diagnosis:   ?? Problem: Inadequate oral intake  ?? Etiology: related to Catabolic illness    ??? Signs and symptoms:  as evidenced by Intake 0-25%, Weight loss    Nutrition Assessment:  ?? Subjective Assessment: RD consult for poor intake/appetie and Day+6 post auto. Pt reports a poor appetite w/ nausea, vomiting, and diarrhea. Per I/o, he has only been drinking Ensure, Water, and Gatorade x3 days, and he has decreased intake x4.5 days. No PO intake x 3 days. He reports a UBW of 198#. CBW down to 185#. 6% wt loss x 1 week, likely with fluid losses as well as nutritional losses. Pt reports only consuming 1 ensure and 1 gatorade per day. Encouraged pt to try bland foods/ broths to increase caloric intake and try to drink additional Ensure or gatorade throughout the day. Also recommended trying to mix gatorade into water to increase tolerance and calories   ?? Nutrition-Focused Physical Findings: diarrhea; dry  mouth; n/v  ?? Wound Type: None  ?? Current Nutrition Therapies:  ?? Oral Diet Orders: Low Microbial   ?? Oral Diet intake: 0%  ?? Oral Nutrition Supplement (ONS) Orders: Standard High Calorie Oral Supplement  ?? ONS intake: 76-100% (x1/ day )  ?? Anthropometric Measures:  ?? Ht: 5' 8.74" (174.6 cm)   ?? Current Body Wt: 187 lb 9.8 oz (85.1 kg)  ?? Admission Body Wt: 201 lb (91.2 kg)  ?? Usual Body Wt: 198 lb (89.8 kg)  ?? % Weight Change: 6%,  x1 week - likely from fluid losses as well as nutritional losses   ?? Ideal Body Wt: 154 lb (69.9 kg),   ?? BMI Classification: BMI 25.0 - 29.9 Overweight  ?? Comparative Standards (Estimated Nutrition Needs):  ?? Estimated Daily Total Kcal: 2553-2979 kcal   ?? Estimated Daily Protein (g): 127-170 grams    Estimated Intake vs Estimated Needs: Intake Less Than Needs    Nutrition Risk Level: High    Nutrition Interventions:   Continue current diet, Continue current ONS  Continued Inpatient Monitoring, Coordination of Care    Nutrition Evaluation:   ?? Evaluation: Goals set   ?? Goals: pt will tolerate and consume 50% or more of all meals and supplements offered     ?? Monitoring: Meal Intake, Supplement Intake, Diet Tolerance, Weight, Nausea or Vomiting, Diarrhea    See Adult Nutrition Doc Flowsheet for more detail.     Electronically signed by Elige Ko,  RD, LD on 09/03/16 at 10:04 AM    RASNICK-WISEMAN, RD, LD  Pager:  [phone]  Office:  [phone]

## 2016-09-03 NOTE — Progress Notes (Signed)
Physical Therapy    Facility/Department: [facility]  Initial Assessment and Treatment    NAME: David Watkins  DOB: 06/24/1956  MRN: [id number]    Date of Service: 09/03/2016    Patient Diagnosis(es): There were no encounter diagnoses.     has a past medical history of Hyperlipidemia; Hypertension; and Multiple myeloma ([place]).   has a past surgical history that includes bone marrow biopsy and other surgical history (Left, 08/17/2016).    Restrictions  Position Activity Restriction  Other position/activity restrictions: If platelet count equal to or less than 10 but the patient has received a platelet transfusion, physical therapy or occupational therapy may proceed with treatment. Up as tolerated  Vision/Hearing  Vision: Within Functional Limits  Hearing: Within functional limits     Subjective  General  Chart Reviewed: Yes  Additional Pertinent Hx: 61 y.o. M admitted 12/27 in prep for autologous stem cell transplant on 12/29. Pt w/ dx of multiple myeloma of July this year.  PMH: hyperlipidemia, HTN, multiple myeloma  Referring Practitioner: Loma Newton, CNP  Referral Date : 09/02/16  Diagnosis: multiple myeloma in remission  Subjective  Subjective: Pt found sitting EOB.  Reports has been walking in hall daily however walking increases back pain.  Rode stationary bike x 10 minutes yesterday without increase in pain.   Pain Screening  Patient Currently in Pain: Yes (R lower back pain, 7/10, RN aware)  Vital Signs  Patient Currently in Pain: Yes (R lower back pain, 7/10, RN aware)       Orientation  Orientation  Overall Orientation Status: Within Normal Limits    Social/Functional History  Social/Functional History  Lives With:  (2 kids (high school age))  Type of Home: House  Home Layout: Bed/Bath upstairs, 1/2 bath on main level (18 steps up to bed/full bath, laundry on main level)  Home Access: Stairs to enter without rails (2-3 STE)  Bathroom Shower/Tub: Tourist information centre manager:  Soil scientist: Higher education careers adviser:  (none)  ADL Assistance: Independent  Homemaking Assistance: Independent (housecleaning assist once every 2 weeks, does own laundry)  Ambulation Assistance: Independent  Transfer Assistance: Independent  Active Driver: Yes  Occupation:  (hasn't worked since December 1)  Additional Comments: Denies h/o falls  Objective          AROM RLE (degrees)  RLE AROM: WFL  AROM LLE (degrees)  LLE AROM : WFL  Strength RLE  Strength RLE: WFL  Strength LLE  Strength LLE: WFL           Transfers  Sit to Stand: Independent  Stand to sit: Independent  Ambulation  Ambulation?: Yes  Ambulation 1  Device: No Device  Assistance: Supervision;Independent  Quality of Gait: guarded, trunk slightly flexed, steady without LOB  Distance: 300'  Comments: Pt reports increased back pain with walking        Exercises  Comments: Pt instructed in back exercises:  lower trunk rotation, single knee to chest stretches, transversus abdomins activation, glut sets.  Pt issued written instructions and verbalized understanding.    Treatment included:  transfers, gt, back exercises, pt education    Assessment   Body structures, Functions, Activity limitations: Decreased functional mobility   Assessment: Pt moving well however limited by chronic back pain.  Encouraged pt to be OOB and ambulating or riding exercise bike as tolerated.  Pt verbalized understanding.  Pt instructed on some simple exercises for low back pain.  Will  f/u 1-2 times to reassess back pain with HEP  Treatment Diagnosis: impaired functional mobility 2/2 back pain  Decision Making: Low Complexity  Patient Education: Educated pt on role of PT, importance of OOB/mobility, HEP for back.  Pt verbalized understanding  REQUIRES PT FOLLOW UP: Yes  PT Equipment Recommendations  Equipment Needed: No     Discharge Recommendations:    CRISTOBAL ADVANI scored a 24/24 on the AM-PAC short mobility form.  At this time, no further PT  recommended upon discharge due to independence with mobility.  Recommend patient returns to prior setting with prior services.  May benefit from OP PT eventually to address back pain         Plan   Plan  Times per week: follow up 1-2 more times for HEP instruction  Current Treatment Recommendations: Functional Mobility Training, Home Exercise Program, Patient/Caregiver Education & Training, Safety Education & Training  Safety Devices  Type of devices: Nurse notified, Left in chair, Call light within reach (no alarms in use)  During  [x]  PT []  OT session, pt was identified as a:   []  High  [x]  Medium []  Low fall risk using the [facility] Fall Risk Assessment tool.    It is recommended:   [x]  Continue  []  Change Patient to:   []  High  [x]   Medium  []  Low  Fall risk.    Documentation was completed on the Fall Risk Navigator and this staff member confirmed that all appropriate fall risk protocols/practices are implemented.       G-Code  PT G-Codes  Functional Limitation: Mobility: Walking and moving around  Mobility: Walking and Moving Around Current Status [phone]): 0 percent impaired, limited or restricted  Mobility: Walking and Moving Around Goal Status [phone]): 0 percent impaired, limited or restricted  OutComes Score                                           AM-PAC Score  AM-PAC Inpatient Mobility Raw Score : 24  AM-PAC Inpatient T-Scale Score : 61.14  Mobility Inpatient CMS 0-100% Score: 0  Mobility Inpatient CMS G-Code Modifier : CH          Goals  Short term goals  Time Frame for Short term goals: By discharge  Short term goal 1: Pt will be independent with back exercises        Therapy Time   Individual Concurrent Group Co-treatment   Time In 0855         Time Out 0940         Minutes 45               Timed Code Treatment Minutes:30       Total Treatment Minutes:  45  If pt d/c'd prior to next treatment, this note serves as a discharge note.    Idelle Leech, PT

## 2016-09-03 NOTE — Plan of Care (Signed)
Problem: Musculor/Skeletal Functional Status  Intervention: PT Evaluation/treatment  Pt will be independent with HEP

## 2016-09-04 LAB — CBC WITH AUTO DIFFERENTIAL
Hematocrit: 29.3 % — ABNORMAL LOW (ref 40.5–52.5)
Hemoglobin: 9.9 g/dL — ABNORMAL LOW (ref 13.5–17.5)
MCH: 29.2 pg (ref 26.0–34.0)
MCHC: 33.8 g/dL (ref 31.0–36.0)
MCV: 86.4 fL (ref 80.0–100.0)
MPV: 6.6 fL (ref 5.0–10.5)
Platelets: 37 10*3/uL — ABNORMAL LOW (ref 135–450)
RBC: 3.39 M/uL — ABNORMAL LOW (ref 4.20–5.90)
RDW: 13 % (ref 12.4–15.4)
WBC: 0.1 10*3/uL — CL (ref 4.0–11.0)

## 2016-09-04 LAB — BASIC METABOLIC PANEL
Anion Gap: 11 (ref 3–16)
BUN: 7 mg/dL (ref 7–20)
CO2: 24 mmol/L (ref 21–32)
Calcium: 8.2 mg/dL — ABNORMAL LOW (ref 8.3–10.6)
Chloride: 105 mmol/L (ref 99–110)
Creatinine: 0.6 mg/dL — ABNORMAL LOW (ref 0.9–1.3)
GFR African American: >60 (ref >60)
GFR Non-African American: >60 (ref >60)
Glucose: 115 mg/dL — ABNORMAL HIGH (ref 70–99)
Potassium: 3.7 mmol/L (ref 3.5–5.1)
Sodium: 140 mmol/L (ref 136–145)

## 2016-09-04 LAB — LACTATE DEHYDROGENASE: LD: 127 U/L (ref 100–190)

## 2016-09-04 LAB — HEPATIC FUNCTION PANEL
ALT: 18 U/L (ref 10–40)
AST: 15 U/L (ref 15–37)
Albumin: 3.3 g/dL — ABNORMAL LOW (ref 3.4–5.0)
Alkaline Phosphatase: 78 U/L (ref 40–129)
Bilirubin, Direct: <0.2 mg/dL (ref 0.0–0.3)
Total Bilirubin: 0.3 mg/dL (ref 0.0–1.0)
Total Protein: 5.4 g/dL — ABNORMAL LOW (ref 6.4–8.2)

## 2016-09-04 LAB — PHOSPHORUS: Phosphorus: 3.3 mg/dL (ref 2.5–4.9)

## 2016-09-04 LAB — URIC ACID: Uric Acid, Serum: 2.5 mg/dL — ABNORMAL LOW (ref 3.5–7.2)

## 2016-09-04 MED FILL — SERTRALINE HCL 50 MG PO TABS: 50 MG | ORAL | Qty: 1

## 2016-09-04 MED FILL — KCL IN DEXTROSE-NACL 10-5-0.45 MEQ/L-%-% IV SOLN: INTRAVENOUS | Qty: 1000

## 2016-09-04 MED FILL — LEVAQUIN 500 MG PO TABS: 500 MG | ORAL | Qty: 1

## 2016-09-04 MED FILL — ORAMORPH SR 30 MG PO TBCR: 30 mg | ORAL | Qty: 1

## 2016-09-04 MED FILL — PROCHLORPERAZINE MALEATE 10 MG PO TABS: 10 MG | ORAL | Qty: 1

## 2016-09-04 MED FILL — LORAZEPAM 1 MG PO TABS: 1 MG | ORAL | Qty: 1

## 2016-09-04 MED FILL — LISINOPRIL 10 MG PO TABS: 10 MG | ORAL | Qty: 1

## 2016-09-04 MED FILL — BACLOFEN 10 MG PO TABS: 10 MG | ORAL | Qty: 2

## 2016-09-04 MED FILL — FLUCONAZOLE 200 MG PO TABS: 200 MG | ORAL | Qty: 2

## 2016-09-04 MED FILL — LOPERAMIDE HCL 2 MG PO CAPS: 2 MG | ORAL | Qty: 1

## 2016-09-04 MED FILL — GRANIX 300 MCG/0.5ML SC SOSY: 300 MCG/0.5ML | SUBCUTANEOUS | Qty: 0.5

## 2016-09-04 MED FILL — MORPHINE SULFATE 15 MG PO TABS: 15 mg | ORAL | Qty: 1

## 2016-09-04 MED FILL — PROCHLORPERAZINE EDISYLATE 5 MG/ML IJ SOLN: 5 MG/ML | INTRAMUSCULAR | Qty: 2

## 2016-09-04 MED FILL — PANTOPRAZOLE SODIUM 40 MG PO TBEC: 40 MG | ORAL | Qty: 1

## 2016-09-04 MED FILL — VALACYCLOVIR HCL 500 MG PO TABS: 500 MG | ORAL | Qty: 1

## 2016-09-04 MED FILL — SODIUM CHLORIDE 0.9 % IV SOLN: 0.9 % | INTRAVENOUS | Qty: 1000

## 2016-09-04 MED FILL — CETIRIZINE HCL 10 MG PO TABS: 10 MG | ORAL | Qty: 1

## 2016-09-04 NOTE — Progress Notes (Addendum)
[facility]  Autologous Progress Note    09/04/2016    David Watkins    DOB:  09-07-1955    MRN:  [id number]    Referring MD: Eulis Canner, [place] [address]    Subjective:  Status quo - no fevers, medically stable; requiring support for activity and diet.     ECOG PS:  (1) Restricted in physically strenuous activity, ambulatory and able to do work of light nature    Isolation:  None     Medications    Scheduled Meds:  ??? levofloxacin  500 mg Oral Daily   ??? morphine  30 mg Oral BID   ??? baclofen  20 mg Oral TID   ??? sertraline  50 mg Oral Daily   ??? Tbo-Filgrastim  300 mcg Subcutaneous QPM   ??? fluconazole  400 mg Oral Daily   ??? Saline Mouthwash  15 mL Swish & Spit 4x Daily AC & HS   ??? sodium chloride flush  10 mL Intravenous 2 times per day   ??? valACYclovir  500 mg Oral BID   ??? umeclidinium-vilanterol  1 puff Inhalation Daily   ??? cetirizine  10 mg Oral Daily   ??? lisinopril  10 mg Oral Daily   ??? pantoprazole  40 mg Oral QAM AC     Continuous Infusions:  ??? sodium chloride     ??? sodium chloride     ??? dextrose 5% and 0.45% NaCl with KCl 10 mEq 100 mL/hr at 09/04/16 0123     PRN Meds:.[COMPLETED] loperamide **FOLLOWED BY** loperamide, morphine, sodium chloride, sodium chloride, alteplase, magnesium hydroxide, magnesium sulfate, potassium chloride, Saline Mouthwash, prochlorperazine **OR** prochlorperazine, LORazepam **OR** LORazepam, albuterol sulfate HFA, polyethylene glycol, LORazepam    ROS:  ?? Constitutional: Denies fever, sweats, weight loss.  ?? Eyes: No visual changes or diplopia. No scleral icterus.  ?? ENT: No Headaches, hearing loss or vertigo. No mouth sores or sore throat.  ?? Cardiovascular: No chest pain, dyspnea on exertion, palpitations or loss of consciousness.   ?? Respiratory: No cough or wheezing, no sputum production. No hemoptysis.  ?? Gastrointestinal: No abdominal pain, no blood in stools. +Diarrhea.  +Appetite loss  ?? Genitourinary: No dysuria, trouble voiding, or  hematuria.  ?? Musculoskeletal:  +generalized weakness & back pain. No joint complaints.  ?? Integumentary: No rash or pruritis.  ?? Neurological: No headache, diplopia. No change in gait, balance, or coordination. No paresthesias.  ?? Endocrine: No temperature intolerance. No excessive thirst, fluid intake, or urination.   ?? Hematologic/Lymphatic: No abnormal bruising or ecchymoses, blood clots or swollen lymph nodes.  ?? Allergic/Immunologic: No nasal congestion or hives.     Physical Exam:     I&O:      Intake/Output Summary (Last 24 hours) at 09/04/16 0717  Last data filed at 09/04/16 0345   Gross per 24 hour   Intake             1547 ml   Output             1450 ml   Net               97 ml       Vital Signs:  BP 131/89    Pulse 91    Temp 98.3 ??F (36.8 ??C) (Oral)    Resp 16    Ht 5' 8.74" (1.746 m)    Wt 187 lb 9.6 oz (85.1 kg)  SpO2 95%    BMI 27.91 kg/m??     Weight:    Wt Readings from Last 3 Encounters:   09/03/16 187 lb 9.6 oz (85.1 kg)   08/17/16 192 lb (87.1 kg)   08/10/16 202 lb (91.6 kg)       General: Awake, alert and oriented ??  HEENT: normocephalic, alopecia, PERRL, no scleral erythema or icterus, Oral mucosa moist and intact, throat clear.   NECK: supple without palpable adenopathy  BACK: Straight, negative CVAT  SKIN: warm dry and intact without lesions rashes or masses  CHEST: CTA bilaterally without use of accessory muscles  CV: Normal S1 S2, RRR, no MRG  ABD: NT ND normoactive BS, no palpable masses or hepatosplenomegaly  EXTREMITIES: without edema, denies calf tenderness  NEURO: CN II - XII grossly intact  CATHETER: LSC Trifusion ([place], 08/17/16) - CDI, no erythema    Data:   CBC:   Recent Labs      09/02/16   0400  09/03/16   0355  09/04/16   0345   WBC  0.2*  0.1*  0.1*   HGB  10.6*  10.2*  9.9*   HCT  32.2*  30.6*  29.3*   MCV  87.4  87.3  86.4   PLT  106*  67*  37*     BMP/Mag:  Recent Labs      09/02/16   0400  09/03/16   0355  09/04/16   0345   NA  141  139  140   K  4.1  3.9  3.7   CL   105  104  105   CO2  24  24  24    PHOS  3.2   --   3.3   BUN  10  8  7    CREATININE  0.6*  0.6*  0.6*   MG   --   2.10   --      LIVP:   Recent Labs      09/02/16   0400  09/04/16   0345   AST  18  15   ALT  22  18   BILIDIR  <0.2  <0.2   BILITOT  0.3  0.3   ALKPHOS  77  78     Uric Acid:    Recent Labs      09/04/16   0345   LABURIC  2.5*     Coags:   Recent Labs      09/03/16   0355   PROTIME  12.6   INR  1.12   APTT  29.9     DIAGNOSTICS:   1. CT Thoracic & Lumbar Spine 08/31/16  Thoracic: Diffuse lytic skeletal metastatic disease compatible with  patient's clinical history of multiple myeloma, most pronounced at  the T6 and T10 vertebrae as detailed in body report. Potential  intraspinal involvement by metastatic disease at T6 and T10  levels, but no evidence for significant thecal sac compression on  noncontrast CT.  No fracture or thoracic spine infection.  Small right pleural effusion  Lumbar: Diffuse lytic skeletal metastatic disease compatible with  patient's clinical history of multiple myeloma. No fracture.  No fracture or lumbar spine infection  L5-S1 severe disc degeneration and grade I isthmic  spondylolisthesis contributing to severe bilateral L5 foraminal  stenosis.    PROBLEM LIST: ????????   ????  Multiple myeloma  Hyperlipidemia  HTN  Neoplasm related pain    Post-Transplant Complications:  1. Anorexia  ????  TREATMENT: ????????   ????  1. Rad Tx to T5-7, T10-L3, Right Scapula - 3000 cGy - Dr. Jamas Lav 03/20/16-04/01/16  2. RVD x1 03/20/16 - discontinued d/t rash   3. Velcade/Pomalyst/Dex x3 cycles 04/23/16-06/17/16 - discontinued d/t reaction to pomalyst  4. Velcade/Dex x 1 cycle 06/26/16 (last dose of dexamethasone 07/22/16  5. High-dose melphalan followed by administration of PBSCs 2.36 x10^6 cd34cells/kg on 08/28/16  ??  ASSESSMENT AND PLAN: ????????   ????  1.  IgG kappa multiple myeloma: PR after 5 cycles chemotherapy - M-spike is 0.4 & BM biopsy 2 % plasma cells  - Admitted for melphalan 253m/m2 preparative regimen  followed by administration of PBSCs 2.36 x10^6 cd34cells/kg on 08/28/16. He is NOT an early release candidate d/t his lack of social support and high anxiety    Day +7: progressing well - needing more encouragement to eat and walk  ??  2.  ID:  Afebrile, no evidence of infection.    - Acyclovir at discharge for VZV positive titer.    - Valtrex, Diflucan & Levaquin ppx  ??  3.  Heme:  Pancytopenia from chemotherapy  - Transfuse for Hgb < 7 and Platelets < 10K  - No transfusion today  - Started Granix 09/02/16  ??  4.  Metabolic:  HypoCa, other electrolytes are WNL and renal fxn stable. Wt down significantly since admission  - Cont IVF hydration: D51/2NS w/10KCl @ 1084mhr   - Replace potassium and magnesium per orders  ??  5. GI/Nutrition:  Mild Malnutrition w/Poor appetite & intake. Now with diarrhea.  - Cont low microbial diet - he's working with dietary to try and improve but making very little progress  - Follow closely with dietary - reconsulted to help encourage PO intake  - Encourage oral supplements - multiple discussions with pt about attempting oral intake have been had  - GERD: Cont PPI  - Diarrhea: CDiff neg 09/02/16, Imodium PRN  ??  6. Pain: Chronic back & rib pain s/p radtx; acute flare of back pain  - MS contin, up to 30 mg BID; MSIR 15 mg q4h; baclofen 20 tid  - Follows with pain specialist as outpt  - CT 08/31/16: no new pathology  ??  7. 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  8. Chronic Bronchitis/COPD: As evidenced by pre-transplant PFTs - FEV1 is 43%, He did respond to bronchodilators and went up to 52%  - Cont albuterol PRN  - Cont anoro ellipta inhaler  - Cleared for transplant by Dr. MuPercell Miller??  9. Cardiac: h/o HLD. 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 - Cleared for BMT by Dr. WhEliane Decree- HTN: Lisinopril 104mdaily  ??  10. Anxiety/Depression  - Ativan PRN  - Dr. SonJavier Dockernsulted  - Started Zoloft qhs this admit  ??  11. Insomnia:  - Ativan PRN qHS    - DVT Prophylaxis: Platelets >50,000 cells/dL, - daily lovenox prophylaxis ordered  Contraindications to pharmacologic prophylaxis: None  Contraindications to mechanical prophylaxis: None  ??  - Disposition:  Will be discharged after transplant when blood counts have recovered and he is afebrile and eating and drinking well.   ??    BriMelvyn NethNP      Janeli Lewison A. FabDrucilla SchmidtO, MS

## 2016-09-04 NOTE — Plan of Care (Signed)
Problem: Infection - Central Venous Catheter-Associated Bloodstream Infection:  Goal: Will show no infection signs and symptoms  Will show no infection signs and symptoms   Outcome: Ongoing  CVC site remains free of signs/symptoms of infection. No drainage, edema, erythema, pain, or warmth noted at site. Dressing changes continue per protocol and on an as needed basis - see flowsheet.     Compliant with BCC Bath Protocol:  Performed CHG bath today per BCC protocol utilizing CHG solution in the shower.  CVC site cleansed with CHG wipe over dressing, skin surrounding dressing, and first 6" of IV tubing.  Pt tolerated well.  Continued to encourage daily CHG bathing per [facility] protocol.      Problem: PROTECTIVE PRECAUTIONS  Goal: Patient will remain free of nosocomial Infections  Outcome: Ongoing  Pt currently in a private, positive pressure room.  Educated pt on wearing a mask when neutropenic and/or leaving the floor.  No living plants or flowers allowed or visitors under the age of 25.  Also reinforced importance of hand hygiene.  Pt following a low microbial diet.      Problem: Pain:  Goal: Pain level will decrease  Pain level will decrease   Outcome: Ongoing  Pt complains of ongoing back pain.  Pain is currently controlled with current regimen of scheduled mscontin and baclofen.  Pt did not request the prn pain medication today.      Problem: Falls - Risk of:  Goal: Will remain free from falls  Will remain free from falls   Outcome: Ongoing  Orthostatic vital signs obtained at start of shift - see flowsheet for details.  Pt does not meet criteria for orthostasis.  Pt is a Med fall risk. See Lattie Corns Fall Score and ABCDS Injury Risk assessments.   Pt bed is in low position, side rails up, call light and belongings are in reach.  Fall risk light is on outside pts room.  Pt encouraged to call for assistance as needed. Will continue with hourly rounds for PO intake, pain needs, toileting and repositioning as needed.      Problem: Nausea/Vomiting:  Goal: Absence of nausea/vomiting  Absence of nausea/vomiting   Outcome: Ongoing  Pt continues to have some nausea and vomiting/dry heaves.  Administered prn compazine with some relief.      Problem: Nutrition Deficit:  Goal: Ability to achieve adequate nutritional intake will improve  Ability to achieve adequate nutritional intake will improve   Outcome: Ongoing  Pt ate a bowl of ckn noodle soup this evening with an ensure.  Encouraged pt to cont to try to increase PO intake.      Problem: Bleeding:  Goal: Will show no signs and symptoms of excessive bleeding  Will show no signs and symptoms of excessive bleeding   Outcome: Ongoing  Patient's hemoglobin this AM:   Recent Labs      09/04/16   0345   HGB  9.9*     Patient's platelet count this AM:   Recent Labs      09/04/16   0345   PLT  37*    Thrombocytopenia Precautions in place.  Patient showing no signs or symptoms of active bleeding.  Transfusion not indicated at this time.  Patient verbalizes understanding of all instructions. Will continue to assess and implement POC. Call light within reach and hourly rounding in place.

## 2016-09-04 NOTE — Plan of Care (Addendum)
Problem: Infection - Central Venous Catheter-Associated Bloodstream Infection:  Goal: Will show no infection signs and symptoms  Will show no infection signs and symptoms   Outcome: Ongoing  CVC site remains free of signs/symptoms of infection. No drainage, edema, erythema, pain, or warmth noted at site. Dressing changes continue per protocol and on an as needed basis - see flowsheet.     Refusing BCC Bath Protocol:  Despite multiple attempts by this RN, pt refusing shower or bed bath with CHG today.  Discussed risks associated with not following BCC bath protocol including increased risk of CVC line infection & sepsis in an immunocompromised pt.  Will discuss continued refusal with treatment team if pt continues to refuse daily bath protocol for 2 or more days.  CVC site cleansed with CHG wipe over dressing, skin surrounding dressing, and first 6" of IV tubing.  Pt tolerated well.  Continued to encourage daily CHG bathing per [facility] protocol.    Problem: PROTECTIVE PRECAUTIONS  Goal: Patient will remain free of nosocomial Infections  Outcome: Ongoing  Pt remains in protective precautions. No living plants or fresh flowers in his/her room. Patient educated on wearing mask when in hallways. Patient, staff, and visitors adhering to handwashing guidelines. Patient cleansed with chlorhexidine wipes and linens changed daily per protocol. Pt verbalizes understanding of low microbial diet. Patient remains free of nosocomial infections.      Problem: Pain:  Goal: Pain level will decrease  Pain level will decrease   Outcome: Ongoing  Pt continues to complain of lower back pain. Pt has MS Contin and Baclofen scheduled and Morphine as needed for pain. Pt slept throughout the night with minimal complaints.    Problem: Falls - Risk of:  Goal: Will remain free from falls  Will remain free from falls   Outcome: Ongoing  Orthostatic vital signs obtained at start of shift - see flowsheet for details.  Pt does not meet criteria for  orthostasis.  Pt is a Med fall risk. See Leamon Arnt Fall Score and ABCDS Injury Risk assessments.     - Screening for Orthostasis AND not a High Falls Risk per MORSE/ABCDS: Pt bed is in low position, side rails up, call light and belongings are in reach.  Fall risk light is on outside pts room.  Pt encouraged to call for assistance as needed. Will continue with hourly rounds for PO intake, pain needs, toileting and repositioning as needed.     Problem: Nausea/Vomiting:  Goal: Absence of nausea/vomiting  Absence of nausea/vomiting   Outcome: Ongoing  Pt had no complaints of n/v this shift. Will continue to monitor.    Problem: Bleeding:  Goal: Will show no signs and symptoms of excessive bleeding  Will show no signs and symptoms of excessive bleeding   Outcome: Ongoing  Patient's hemoglobin this AM:   Recent Labs      09/04/16   0345   HGB  9.9*     Patient's platelet count this AM:   Recent Labs      09/04/16   0345   PLT  37*    Thrombocytopenia Precautions in place.  Patient showing no signs or symptoms of active bleeding.  Transfusion not indicated at this time.  Patient verbalizes understanding of all instructions. Will continue to assess and implement POC. Call light within reach and hourly rounding in place.     Problem: Venous Thromboembolism:  Goal: Will show no signs or symptoms of venous thromboembolism  Will show no signs or symptoms  of venous thromboembolism   Outcome: Ongoing  Adherent with DVT Prevention: Pt is at risk for DVT d/t decreased mobility and cancer treatment.  Pt educated on importance of activity.  Pt has orders for Subcut prophylactic lovenox.  Pt verbalizes understanding of need for prophylaxis while inpatient.

## 2016-09-04 NOTE — Other (Signed)
09/04/16 1106   Encounter Summary   Services provided to: Patient   Referral/Consult From: Rounding   Continue Visiting (es 1/5)   Complexity of Encounter Low   Length of Encounter 15 minutes   Routine   Type Follow up   Assessment Approachable   Intervention Active listening   Outcome Engaged in conversation

## 2016-09-05 LAB — BASIC METABOLIC PANEL
Anion Gap: 9 (ref 3–16)
BUN: 6 mg/dL — ABNORMAL LOW (ref 7–20)
CO2: 24 mmol/L (ref 21–32)
Calcium: 8.1 mg/dL — ABNORMAL LOW (ref 8.3–10.6)
Chloride: 106 mmol/L (ref 99–110)
Creatinine: 0.7 mg/dL — ABNORMAL LOW (ref 0.9–1.3)
GFR African American: >60 (ref >60)
GFR Non-African American: >60 (ref >60)
Glucose: 120 mg/dL — ABNORMAL HIGH (ref 70–99)
Potassium: 3.7 mmol/L (ref 3.5–5.1)
Sodium: 139 mmol/L (ref 136–145)

## 2016-09-05 LAB — CBC WITH AUTO DIFFERENTIAL
Hematocrit: 28 % — ABNORMAL LOW (ref 40.5–52.5)
Hemoglobin: 9.7 g/dL — ABNORMAL LOW (ref 13.5–17.5)
MCH: 29.4 pg (ref 26.0–34.0)
MCHC: 34.5 g/dL (ref 31.0–36.0)
MCV: 85.3 fL (ref 80.0–100.0)
MPV: 6.9 fL (ref 5.0–10.5)
Platelets: 16 10*3/uL — CL (ref 135–450)
RBC: 3.29 M/uL — ABNORMAL LOW (ref 4.20–5.90)
RDW: 13.1 % (ref 12.4–15.4)
WBC: 0.1 10*3/uL — CL (ref 4.0–11.0)

## 2016-09-05 LAB — URIC ACID: Uric Acid, Serum: 2.4 mg/dL — ABNORMAL LOW (ref 3.5–7.2)

## 2016-09-05 MED ORDER — ONDANSETRON HCL 4 MG/2ML IJ SOLN
4 MG/2ML | Freq: Four times a day (QID) | INTRAMUSCULAR | Status: DC
Start: 2016-09-05 — End: 2016-09-10
  Administered 2016-09-05 – 2016-09-10 (×20): 4 mg via INTRAVENOUS

## 2016-09-05 MED FILL — BACLOFEN 10 MG PO TABS: 10 MG | ORAL | Qty: 2

## 2016-09-05 MED FILL — LORAZEPAM 0.5 MG PO TABS: 0.5 MG | ORAL | Qty: 1

## 2016-09-05 MED FILL — FLUCONAZOLE 200 MG PO TABS: 200 MG | ORAL | Qty: 2

## 2016-09-05 MED FILL — KCL IN DEXTROSE-NACL 10-5-0.45 MEQ/L-%-% IV SOLN: INTRAVENOUS | Qty: 1000

## 2016-09-05 MED FILL — ONDANSETRON HCL 4 MG/2ML IJ SOLN: 4 MG/2ML | INTRAMUSCULAR | Qty: 2

## 2016-09-05 MED FILL — LORAZEPAM 1 MG PO TABS: 1 MG | ORAL | Qty: 1

## 2016-09-05 MED FILL — LOPERAMIDE HCL 2 MG PO CAPS: 2 MG | ORAL | Qty: 1

## 2016-09-05 MED FILL — LEVAQUIN 500 MG PO TABS: 500 MG | ORAL | Qty: 1

## 2016-09-05 MED FILL — GRANIX 300 MCG/0.5ML SC SOSY: 300 MCG/0.5ML | SUBCUTANEOUS | Qty: 0.5

## 2016-09-05 MED FILL — LISINOPRIL 10 MG PO TABS: 10 MG | ORAL | Qty: 1

## 2016-09-05 MED FILL — VALACYCLOVIR HCL 500 MG PO TABS: 500 MG | ORAL | Qty: 1

## 2016-09-05 MED FILL — MORPHINE SULFATE 15 MG PO TABS: 15 mg | ORAL | Qty: 1

## 2016-09-05 MED FILL — CETIRIZINE HCL 10 MG PO TABS: 10 MG | ORAL | Qty: 1

## 2016-09-05 MED FILL — ORAMORPH SR 30 MG PO TBCR: 30 mg | ORAL | Qty: 1

## 2016-09-05 MED FILL — SERTRALINE HCL 50 MG PO TABS: 50 MG | ORAL | Qty: 1

## 2016-09-05 MED FILL — PROCHLORPERAZINE EDISYLATE 5 MG/ML IJ SOLN: 5 MG/ML | INTRAMUSCULAR | Qty: 2

## 2016-09-05 MED FILL — PROCHLORPERAZINE MALEATE 10 MG PO TABS: 10 MG | ORAL | Qty: 1

## 2016-09-05 MED FILL — PANTOPRAZOLE SODIUM 40 MG PO TBEC: 40 MG | ORAL | Qty: 1

## 2016-09-05 NOTE — Plan of Care (Signed)
Problem: Infection - Central Venous Catheter-Associated Bloodstream Infection:  Goal: Will show no infection signs and symptoms  Will show no infection signs and symptoms   Outcome: Ongoing  CVC site remains free of signs/symptoms of infection. No drainage, edema, erythema, pain, or warmth noted at site. Dressing changes continue per protocol and on an as needed basis - see flowsheet.       Problem: PROTECTIVE PRECAUTIONS  Goal: Patient will remain free of nosocomial Infections  Outcome: Ongoing  Pt with no s/s nosocomial infections noted. Protective precautions in place. Reinforced education regarding hygiene and precautions for visitors. Will continue to monitor      Problem: Pain:  Goal: Pain level will decrease  Pain level will decrease   Outcome: Ongoing  Pt with complaints of chronic back pain during shift. Medicated with PRN oxycodone and scheduled MS Contin, baclofen per MAR with fair response per pt.  Pt denies further needs at this time. Will continue to monitor and implement plan of care.     Problem: Falls - Risk of:  Goal: Will remain free from falls  Will remain free from falls   Outcome: Ongoing  Orthostatic vital signs obtained at start of shift - see flowsheet for details.  Pt does not meet criteria for orthostasis.  Pt is a Medium fall risk. See Leamon Arnt Fall Score and ABCDS Injury Risk assessments.   - Screening for Orthostasis AND not a High Falls Risk per MORSE/ABCDS: Pt bed is in low position, side rails up, call light and belongings are in reach.  Fall risk light is on outside pts room.  Pt encouraged to call for assistance as needed. Will continue with hourly rounds for PO intake, pain needs, toileting and repositioning as needed.           Problem: Nausea/Vomiting:  Goal: Absence of nausea/vomiting  Absence of nausea/vomiting   Outcome: Ongoing  Pt with no complaints of nausea throughout shift.  No episodes emesis noted. Pt tolerated PO medications without issues. Will continue to monitor.      Problem: Bleeding:  Goal: Will show no signs and symptoms of excessive bleeding  Will show no signs and symptoms of excessive bleeding   Outcome: Ongoing      Problem: Venous Thromboembolism:  Goal: Will show no signs or symptoms of venous thromboembolism  Will show no signs or symptoms of venous thromboembolism   Outcome: Ongoing

## 2016-09-05 NOTE — Progress Notes (Signed)
[place]  Autologous Progress Note    09/05/2016    David Watkins    DOB:  10/06/1955    MRN:  [id number]    Referring MD: Eulis Canner, [place] [address]    Subjective:  Status quo - no fevers, medically stable; requiring support for activity and diet.     ECOG PS:  (1) Restricted in physically strenuous activity, ambulatory and able to do work of light nature    Isolation:  None     Medications    Scheduled Meds:  ??? levofloxacin  500 mg Oral Daily   ??? morphine  30 mg Oral BID   ??? baclofen  20 mg Oral TID   ??? sertraline  50 mg Oral Daily   ??? Tbo-Filgrastim  300 mcg Subcutaneous QPM   ??? fluconazole  400 mg Oral Daily   ??? Saline Mouthwash  15 mL Swish & Spit 4x Daily AC & HS   ??? sodium chloride flush  10 mL Intravenous 2 times per day   ??? valACYclovir  500 mg Oral BID   ??? umeclidinium-vilanterol  1 puff Inhalation Daily   ??? cetirizine  10 mg Oral Daily   ??? lisinopril  10 mg Oral Daily   ??? pantoprazole  40 mg Oral QAM AC     Continuous Infusions:  ??? sodium chloride     ??? sodium chloride     ??? dextrose 5% and 0.45% NaCl with KCl 10 mEq 100 mL/hr at 09/05/16 0753     PRN Meds:.[COMPLETED] loperamide **FOLLOWED BY** loperamide, morphine, sodium chloride, sodium chloride, alteplase, magnesium hydroxide, magnesium sulfate, potassium chloride, Saline Mouthwash, prochlorperazine **OR** prochlorperazine, LORazepam **OR** LORazepam, albuterol sulfate HFA, polyethylene glycol, LORazepam    ROS:  ?? Constitutional: Denies fever, sweats, weight loss.  ?? Eyes: No visual changes or diplopia. No scleral icterus.  ?? ENT: No Headaches, hearing loss or vertigo. No mouth sores or sore throat.  ?? Cardiovascular: No chest pain, dyspnea on exertion, palpitations or loss of consciousness.   ?? Respiratory: No cough or wheezing, no sputum production. No hemoptysis.  ?? Gastrointestinal: No abdominal pain, no blood in stools. +Diarrhea.  +Appetite loss  ?? Genitourinary: No dysuria, trouble voiding, or  hematuria.  ?? Musculoskeletal:  +generalized weakness & back pain. No joint complaints.  ?? Integumentary: No rash or pruritis.  ?? Neurological: No headache, diplopia. No change in gait, balance, or coordination. No paresthesias.  ?? Endocrine: No temperature intolerance. No excessive thirst, fluid intake, or urination.   ?? Hematologic/Lymphatic: No abnormal bruising or ecchymoses, blood clots or swollen lymph nodes.  ?? Allergic/Immunologic: No nasal congestion or hives.     Physical Exam:     I&O:      Intake/Output Summary (Last 24 hours) at 09/05/16 1040  Last data filed at 09/05/16 0655   Gross per 24 hour   Intake          4444.33 ml   Output             1050 ml   Net          3394.33 ml       Vital Signs:  BP 128/80    Pulse 81    Temp 97.9 ??F (36.6 ??C) (Oral)    Resp 18    Ht 5' 8.74" (1.746 m)    Wt 186 lb 11.2 oz (84.7 kg)    SpO2 94%    BMI  27.78 kg/m??     Weight:    Wt Readings from Last 3 Encounters:   09/05/16 186 lb 11.2 oz (84.7 kg)   08/17/16 192 lb (87.1 kg)   08/10/16 202 lb (91.6 kg)       General: Awake, alert and oriented ??  HEENT: normocephalic, alopecia, PERRL, no scleral erythema or icterus, Oral mucosa moist and intact, throat clear.   NECK: supple without palpable adenopathy  BACK: Straight, negative CVAT  SKIN: warm dry and intact without lesions rashes or masses  CHEST: CTA bilaterally without use of accessory muscles  CV: Normal S1 S2, RRR, no MRG  ABD: NT ND normoactive BS, no palpable masses or hepatosplenomegaly  EXTREMITIES: without edema, denies calf tenderness  NEURO: CN II - XII grossly intact  CATHETER: LSC Trifusion ([place], 08/17/16) - CDI, no erythema    Data:   CBC:   Recent Labs      09/03/16   0355  09/04/16   0345  09/05/16   0315   WBC  0.1*  0.1*  0.1*   HGB  10.2*  9.9*  9.7*   HCT  30.6*  29.3*  28.0*   MCV  87.3  86.4  85.3   PLT  67*  37*  16*     BMP/Mag:  Recent Labs      09/03/16   0355  09/04/16   0345  09/05/16   0315   NA  139  140  139   K  3.9  3.7  3.7   CL   104  105  106   CO2  24  24  24    PHOS   --   3.3   --    BUN  8  7  6*   CREATININE  0.6*  0.6*  0.7*   MG  2.10   --    --      LIVP:   Recent Labs      09/04/16   0345   AST  15   ALT  18   BILIDIR  <0.2   BILITOT  0.3   ALKPHOS  78     Uric Acid:    Recent Labs      09/05/16   0315   LABURIC  2.4*     Coags:   Recent Labs      09/03/16   0355   PROTIME  12.6   INR  1.12   APTT  29.9     DIAGNOSTICS:   1. CT Thoracic & Lumbar Spine 08/31/16  Thoracic: Diffuse lytic skeletal metastatic disease compatible with  patient's clinical history of multiple myeloma, most pronounced at  the T6 and T10 vertebrae as detailed in body report. Potential  intraspinal involvement by metastatic disease at T6 and T10  levels, but no evidence for significant thecal sac compression on  noncontrast CT.  No fracture or thoracic spine infection.  Small right pleural effusion  Lumbar: Diffuse lytic skeletal metastatic disease compatible with  patient's clinical history of multiple myeloma. No fracture.  No fracture or lumbar spine infection  L5-S1 severe disc degeneration and grade I isthmic  spondylolisthesis contributing to severe bilateral L5 foraminal  stenosis.    PROBLEM LIST: ????????   ????  Multiple myeloma  Hyperlipidemia  HTN  Neoplasm related pain    Post-Transplant Complications:  1. Anorexia  ????  TREATMENT: ????????   ????  1. Rad Tx to T5-7, T10-L3, Right Scapula - 3000  cGy - Dr. Jamas Lav 03/20/16-04/01/16  2. RVD x1 03/20/16 - discontinued d/t rash   3. Velcade/Pomalyst/Dex x3 cycles 04/23/16-06/17/16 - discontinued d/t reaction to pomalyst  4. Velcade/Dex x 1 cycle 06/26/16 (last dose of dexamethasone 07/22/16  5. High-dose melphalan followed by administration of PBSCs 2.36 x10^6 cd34cells/kg on 08/28/16  ??  ASSESSMENT AND PLAN: ????????   ????  1.  IgG kappa multiple myeloma: PR after 5 cycles chemotherapy - M-spike is 0.4 & BM biopsy 2 % plasma cells  - Admitted for melphalan 252m/m2 preparative regimen followed by administration of PBSCs 2.36  x10^6 cd34cells/kg on 08/28/16. He is NOT an early release candidate d/t his lack of social support and high anxiety    Day + 8: progressing well - needing more encouragement to eat and walk  ??  2.  ID:  Afebrile, no evidence of infection.    - Acyclovir at discharge for VZV positive titer.    - Valtrex, Diflucan & Levaquin ppx  ??  3.  Heme:  Pancytopenia from chemotherapy  - Transfuse for Hgb < 7 and Platelets < 10K  - No transfusion today  - Started Granix 09/02/16  ??  4.  Metabolic:  HypoCa, other electrolytes are WNL and renal fxn stable. Wt down significantly since admission  - Cont IVF hydration: D51/2NS w/10KCl @ 1070mhr   - Replace potassium and magnesium per orders  ??  5. GI/Nutrition:  Mild Malnutrition w/Poor appetite & intake. Now with diarrhea.  - Cont low microbial diet - he's working with dietary to try and improve but making very little progress  - Follow closely with dietary - reconsulted to help encourage PO intake  - Encourage oral supplements - multiple discussions with pt about attempting oral intake have been had  - GERD: Cont PPI  - Diarrhea: CDiff neg 09/02/16, Imodium PRN  ??  6. Pain: Chronic back & rib pain s/p radtx; acute flare of back pain  - MS contin, up to 30 mg BID; MSIR 15 mg q4h; baclofen 20 tid  - Follows with pain specialist as outpt  - CT 08/31/16: no new pathology  ??  7. 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  8. Chronic Bronchitis/COPD: As evidenced by pre-transplant PFTs - FEV1 is 43%, He did respond to bronchodilators and went up to 52%  - Cont albuterol PRN  - Cont anoro ellipta inhaler  - Cleared for transplant by Dr. MuPercell Miller??  9. Cardiac: h/o HLD. 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 - Cleared for BMT by Dr. WhEliane Decree- HTN: Lisinopril 1065maily  ??  10. Anxiety/Depression  - Ativan  PRN  - Dr. SonJavier Dockernsulted  - Started Zoloft qhs this admit  ??  11. Insomnia:  - Ativan PRN qHS    - DVT Prophylaxis: Platelets >50,000 cells/dL, - daily lovenox prophylaxis ordered  Contraindications to pharmacologic prophylaxis: None  Contraindications to mechanical prophylaxis: None  ??  - Disposition:  Will be discharged after transplant when blood counts have recovered and he is afebrile and eating and drinking well.   ??  EdwJuliann MuleroDerrill KayD [place]

## 2016-09-05 NOTE — Plan of Care (Signed)
Problem: Infection - Central Venous Catheter-Associated Bloodstream Infection:  Goal: Will show no infection signs and symptoms  Will show no infection signs and symptoms   Outcome: Ongoing  CVC site remains free of signs/symptoms of infection. No drainage, edema, erythema, pain, or warmth noted at site. Dressing changes continue per protocol and on an as needed basis - see flowsheet.         Problem: PROTECTIVE PRECAUTIONS  Goal: Patient will remain free of nosocomial Infections  Outcome: Ongoing  Pt currently in a private, positive pressure room.  Educated pt on wearing a mask when neutropenic and/or leaving the floor.  No living plants or flowers allowed or visitors under the age of 47.  Also reinforced importance of hand hygiene.  Pt following a low microbial diet.      Problem: Pain:  Goal: Pain level will decrease  Pain level will decrease   Outcome: Ongoing  Pt reports ongoing lower back pain.  Pain was not relieved with scheduled mscontin and baclofen and pt needed to take his prn PO morphine once this afternoon.  Pt did report some relief following prn pain pill.      Problem: Falls - Risk of:  Goal: Will remain free from falls  Will remain free from falls   Outcome: Ongoing  Orthostatic vital signs obtained at start of shift - see flowsheet for details.  Pt does not meet criteria for orthostasis.  Pt is a Med fall risk. See Leamon Arnt Fall Score and ABCDS Injury Risk assessments.   - Screening for Orthostasis AND not a High Falls Risk per MORSE/ABCDS: Pt bed is in low position, side rails up, call light and belongings are in reach.  Fall risk light is on outside pts room.  Pt encouraged to call for assistance as needed. Will continue with hourly rounds for PO intake, pain needs, toileting and repositioning as needed.     Problem: Nausea/Vomiting:  Goal: Absence of nausea/vomiting  Absence of nausea/vomiting   Outcome: Ongoing  Pt with ongoing n/v.  Discussed with MD Derrill Kay with new orders for scheduled Zofran.   Pt reported some relief following Zofran administration.     Problem: Nutrition Deficit:  Goal: Ability to achieve adequate nutritional intake will improve  Ability to achieve adequate nutritional intake will improve   Outcome: Ongoing  Pt with decreased appetite and upset stomach.  Able to eat a bowl of ckn noodle soup and half an ensure thus far.     Problem: Bleeding:  Goal: Will show no signs and symptoms of excessive bleeding  Will show no signs and symptoms of excessive bleeding   Outcome: Ongoing  Patient's hemoglobin this AM:   Recent Labs      09/05/16   0315   HGB  9.7*     Patient's platelet count this AM:   Recent Labs      09/05/16   0315   PLT  16*    Thrombocytopenia Precautions in place.  Patient showing no signs or symptoms of active bleeding.  Transfusion not indicated at this time.  Patient verbalizes understanding of all instructions. Will continue to assess and implement POC. Call light within reach and hourly rounding in place.

## 2016-09-06 LAB — PREPARE PLATELETS: Dispense Status Blood Bank: TRANSFUSED

## 2016-09-06 LAB — BASIC METABOLIC PANEL
Anion Gap: 10 (ref 3–16)
BUN: 6 mg/dL — ABNORMAL LOW (ref 7–20)
CO2: 23 mmol/L (ref 21–32)
Calcium: 7.8 mg/dL — ABNORMAL LOW (ref 8.3–10.6)
Chloride: 104 mmol/L (ref 99–110)
Creatinine: 0.6 mg/dL — ABNORMAL LOW (ref 0.9–1.3)
GFR African American: >60 (ref >60)
GFR Non-African American: >60 (ref >60)
Glucose: 117 mg/dL — ABNORMAL HIGH (ref 70–99)
Potassium: 3.6 mmol/L (ref 3.5–5.1)
Sodium: 137 mmol/L (ref 136–145)

## 2016-09-06 LAB — CBC WITH AUTO DIFFERENTIAL
Hematocrit: 26.6 % — ABNORMAL LOW (ref 40.5–52.5)
Hemoglobin: 9 g/dL — ABNORMAL LOW (ref 13.5–17.5)
MCH: 28.9 pg (ref 26.0–34.0)
MCHC: 34 g/dL (ref 31.0–36.0)
MCV: 85 fL (ref 80.0–100.0)
MPV: 8.1 fL (ref 5.0–10.5)
Platelets: 5 10*3/uL — CL (ref 135–450)
RBC: 3.13 M/uL — ABNORMAL LOW (ref 4.20–5.90)
RDW: 12.8 % (ref 12.4–15.4)
WBC: 0.1 10*3/uL — CL (ref 4.0–11.0)

## 2016-09-06 LAB — URIC ACID: Uric Acid, Serum: 2.3 mg/dL — ABNORMAL LOW (ref 3.5–7.2)

## 2016-09-06 MED ORDER — SODIUM CHLORIDE 0.9 % IV BOLUS
0.9 % | Freq: Once | INTRAVENOUS | Status: AC
Start: 2016-09-06 — End: 2016-09-06
  Administered 2016-09-06: 11:00:00 250 mL via INTRAVENOUS

## 2016-09-06 MED FILL — FLUCONAZOLE 200 MG PO TABS: 200 MG | ORAL | Qty: 2

## 2016-09-06 MED FILL — BACLOFEN 10 MG PO TABS: 10 MG | ORAL | Qty: 2

## 2016-09-06 MED FILL — KCL IN DEXTROSE-NACL 10-5-0.45 MEQ/L-%-% IV SOLN: INTRAVENOUS | Qty: 1000

## 2016-09-06 MED FILL — ONDANSETRON HCL 4 MG/2ML IJ SOLN: 4 MG/2ML | INTRAMUSCULAR | Qty: 2

## 2016-09-06 MED FILL — VALACYCLOVIR HCL 500 MG PO TABS: 500 MG | ORAL | Qty: 1

## 2016-09-06 MED FILL — MORPHINE SULFATE 15 MG PO TABS: 15 mg | ORAL | Qty: 1

## 2016-09-06 MED FILL — SODIUM CHLORIDE 0.9 % IV SOLN: 0.9 % | INTRAVENOUS | Qty: 250

## 2016-09-06 MED FILL — LOPERAMIDE HCL 2 MG PO CAPS: 2 MG | ORAL | Qty: 1

## 2016-09-06 MED FILL — LORAZEPAM 1 MG PO TABS: 1 MG | ORAL | Qty: 1

## 2016-09-06 MED FILL — ORAMORPH SR 30 MG PO TBCR: 30 mg | ORAL | Qty: 1

## 2016-09-06 MED FILL — CETIRIZINE HCL 10 MG PO TABS: 10 MG | ORAL | Qty: 1

## 2016-09-06 MED FILL — LISINOPRIL 10 MG PO TABS: 10 MG | ORAL | Qty: 1

## 2016-09-06 MED FILL — GRANIX 300 MCG/0.5ML SC SOSY: 300 MCG/0.5ML | SUBCUTANEOUS | Qty: 0.5

## 2016-09-06 MED FILL — SERTRALINE HCL 50 MG PO TABS: 50 MG | ORAL | Qty: 1

## 2016-09-06 MED FILL — LEVAQUIN 500 MG PO TABS: 500 MG | ORAL | Qty: 1

## 2016-09-06 MED FILL — PANTOPRAZOLE SODIUM 40 MG PO TBEC: 40 MG | ORAL | Qty: 1

## 2016-09-06 MED FILL — PROCHLORPERAZINE MALEATE 10 MG PO TABS: 10 MG | ORAL | Qty: 1

## 2016-09-06 MED FILL — LORAZEPAM 0.5 MG PO TABS: 0.5 MG | ORAL | Qty: 1

## 2016-09-06 NOTE — Plan of Care (Signed)
Problem: Infection - Central Venous Catheter-Associated Bloodstream Infection:  Goal: Will show no infection signs and symptoms  Will show no infection signs and symptoms   Outcome: Met This Shift  CVC site remains free of signs/symptoms of infection. No drainage, edema, erythema, pain, or warmth noted at site. Dressing changes continue per protocol and on an as needed basis - see flowsheet.     Compliant with BCC Bath Protocol:  Performed CHG bath today per [facility] protocol utilizing shower solution.  CVC site cleansed with CHG wipe over dressing, skin surrounding dressing, and first 6" of IV tubing.  Pt tolerated well.  Continued to encourage daily CHG bathing per [facility] protocol.          Problem: PROTECTIVE PRECAUTIONS  Goal: Patient will remain free of nosocomial Infections  Outcome: Met This Shift  Pt remains afebrile this shift. VSS. Pt educated on infection prevention and continues to follow protective precautions appropriately. Will continue to monitor.     Problem: Pain:  Goal: Pain level will decrease  Pain level will decrease   Outcome: Ongoing  Continues to receive scheduled pain meds for chronic back pain.  Pain is unchanged.  Pt reports some abdominal discomfort which he attributes to "drive heaving yesterday".  No intervention required    Problem: Falls - Risk of:  Goal: Will remain free from falls  Will remain free from falls   Outcome: Met This Shift  Pt free from injury.  Ortho negative. VSS. Up ad lib. Steady gait.     Problem: Nausea/Vomiting:  Goal: Absence of nausea/vomiting  Absence of nausea/vomiting   Outcome: Ongoing  Zofran scheduled every 6 hours.  Compazine given x1 prior to eating.     Problem: Nutrition Deficit:  Goal: Ability to achieve adequate nutritional intake will improve  Ability to achieve adequate nutritional intake will improve   Outcome: Ongoing  Encouraging patient to eat small frequent meal. Suggestions of cold prep meals and easy to eat foods.  Will monitor    Problem: Venous  Thromboembolism:  Goal: Will show no signs or symptoms of venous thromboembolism  Will show no signs or symptoms of venous thromboembolism   Outcome: Ongoing  Platelet count 5.  Pt encouraged to increase activity and pump heels while in bed

## 2016-09-06 NOTE — Progress Notes (Signed)
[facility]  Autologous Progress Note    09/06/2016    David Watkins    DOB:  [birth year]    MRN:  [id number]    Referring MD: Eulis Canner, [place] [address]    Subjective:  Status quo - no fevers, medically stable; requiring support for activity and diet.     ECOG PS:  2    Isolation:  None     Medications    Scheduled Meds:  ??? sodium chloride  250 mL Intravenous Once   ??? ondansetron  4 mg Intravenous Q6H   ??? levofloxacin  500 mg Oral Daily   ??? morphine  30 mg Oral BID   ??? baclofen  20 mg Oral TID   ??? sertraline  50 mg Oral Daily   ??? Tbo-Filgrastim  300 mcg Subcutaneous QPM   ??? fluconazole  400 mg Oral Daily   ??? Saline Mouthwash  15 mL Swish & Spit 4x Daily AC & HS   ??? sodium chloride flush  10 mL Intravenous 2 times per day   ??? valACYclovir  500 mg Oral BID   ??? umeclidinium-vilanterol  1 puff Inhalation Daily   ??? cetirizine  10 mg Oral Daily   ??? lisinopril  10 mg Oral Daily   ??? pantoprazole  40 mg Oral QAM AC     Continuous Infusions:  ??? sodium chloride     ??? sodium chloride     ??? dextrose 5% and 0.45% NaCl with KCl 10 mEq 100 mL/hr at 09/06/16 0342     PRN Meds:.[COMPLETED] loperamide **FOLLOWED BY** loperamide, morphine, sodium chloride, sodium chloride, alteplase, magnesium hydroxide, magnesium sulfate, potassium chloride, Saline Mouthwash, prochlorperazine **OR** prochlorperazine, LORazepam **OR** LORazepam, albuterol sulfate HFA, polyethylene glycol, LORazepam    ROS:  ?? Constitutional: Denies fever, sweats, weight loss.  ?? Eyes: No visual changes or diplopia. No scleral icterus.  ?? ENT: No Headaches, hearing loss or vertigo. No mouth sores or sore throat.  ?? Cardiovascular: No chest pain, dyspnea on exertion, palpitations or loss of consciousness.   ?? Respiratory: No cough or wheezing, no sputum production. No hemoptysis.  ?? Gastrointestinal: No abdominal pain, no blood in stools. +Diarrhea.  +Appetite loss  ?? Genitourinary: No dysuria, trouble voiding, or  hematuria.  ?? Musculoskeletal:  +generalized weakness & back pain. No joint complaints.  ?? Integumentary: No rash or pruritis.  ?? Neurological: No headache, diplopia. No change in gait, balance, or coordination. No paresthesias.  ?? Endocrine: No temperature intolerance. No excessive thirst, fluid intake, or urination.   ?? Hematologic/Lymphatic: No abnormal bruising or ecchymoses, blood clots or swollen lymph nodes.  ?? Allergic/Immunologic: No nasal congestion or hives.     Physical Exam:     I&O:      Intake/Output Summary (Last 24 hours) at 09/06/16 0933  Last data filed at 09/06/16 0552   Gross per 24 hour   Intake             2263 ml   Output             1670 ml   Net              593 ml       Vital Signs:  BP 119/73    Pulse 83    Temp 98.3 ??F (36.8 ??C) (Oral)    Resp 16    Ht 5' 8.74" (1.746 m)    Wt 187 lb 9.6  oz (85.1 kg)    SpO2 95%    BMI 27.91 kg/m??     Weight:    Wt Readings from Last 3 Encounters:   09/06/16 187 lb 9.6 oz (85.1 kg)   08/17/16 192 lb (87.1 kg)   08/10/16 202 lb (91.6 kg)       General: Awake, alert and oriented ??  HEENT: normocephalic, alopecia, PERRL, no scleral erythema or icterus, Oral mucosa moist and intact, throat clear.   NECK: supple without palpable adenopathy  BACK: Straight, negative CVAT  SKIN: warm dry and intact without lesions rashes or masses  CHEST: CTA bilaterally without use of accessory muscles  CV: Normal S1 S2, RRR, no MRG  ABD: NT ND normoactive BS, no palpable masses or hepatosplenomegaly  EXTREMITIES: without edema, denies calf tenderness  NEURO: CN II - XII grossly intact  CATHETER: LSC Trifusion ([place], 08/17/16) - CDI, no erythema    Data:   CBC:   Recent Labs      09/04/16   0345  09/05/16   0315  09/06/16   0335   WBC  0.1*  0.1*  0.1*   HGB  9.9*  9.7*  9.0*   HCT  29.3*  28.0*  26.6*   MCV  86.4  85.3  85.0   PLT  37*  16*  5*     BMP/Mag:  Recent Labs      09/04/16   0345  09/05/16   0315  09/06/16   0335   NA  140  139  137   K  3.7  3.7  3.6   CL  105   106  104   CO2  24  24  23    PHOS  3.3   --    --    BUN  7  6*  6*   CREATININE  0.6*  0.7*  0.6*     LIVP:   Recent Labs      09/04/16   0345   AST  15   ALT  18   BILIDIR  <0.2   BILITOT  0.3   ALKPHOS  78     Uric Acid:    Recent Labs      09/06/16   0335   LABURIC  2.3*     Coags:   No results for input(s): PROTIME, INR, APTT in the last 72 hours.  DIAGNOSTICS:   1. CT Thoracic & Lumbar Spine 08/31/16  Thoracic: Diffuse lytic skeletal metastatic disease compatible with  patient's clinical history of multiple myeloma, most pronounced at  the T6 and T10 vertebrae as detailed in body report. Potential  intraspinal involvement by metastatic disease at T6 and T10  levels, but no evidence for significant thecal sac compression on  noncontrast CT.  No fracture or thoracic spine infection.  Small right pleural effusion  Lumbar: Diffuse lytic skeletal metastatic disease compatible with  patient's clinical history of multiple myeloma. No fracture.  No fracture or lumbar spine infection  L5-S1 severe disc degeneration and grade I isthmic  spondylolisthesis contributing to severe bilateral L5 foraminal  stenosis.    PROBLEM LIST: ????????   ????  Multiple myeloma  Hyperlipidemia  HTN  Neoplasm related pain    Post-Transplant Complications:  1. Anorexia  ????  TREATMENT: ????????   ????  1. Rad Tx to T5-7, T10-L3, Right Scapula - 3000 cGy - Dr. Jamas Lav 03/20/16-04/01/16  2. RVD x1 03/20/16 - discontinued d/t rash   3. Velcade/Pomalyst/Dex  x3 cycles 04/23/16-06/17/16 - discontinued d/t reaction to pomalyst  4. Velcade/Dex x 1 cycle 06/26/16 (last dose of dexamethasone 07/22/16  5. High-dose melphalan followed by administration of PBSCs 2.36 x10^6 cd34cells/kg on 08/28/16  ??  ASSESSMENT AND PLAN: ????????   ????  1.  IgG kappa multiple myeloma: PR after 5 cycles chemotherapy - M-spike is 0.4 & BM biopsy 2 % plasma cells  - Admitted for melphalan 216m/m2 preparative regimen followed by administration of PBSCs 2.36 x10^6 cd34cells/kg on 08/28/16. He is  NOT an early release candidate d/t his lack of social support and high anxiety    Day + 9: progressing well - given more encouragement to eat and walk  ??  2.  ID:  Afebrile, no evidence of infection.    - Acyclovir at discharge for VZV positive titer.    - Valtrex, Diflucan & Levaquin ppx  ??  3.  Heme:  Pancytopenia from chemotherapy  - Transfuse for Hgb < 7 and Platelets < 10K  - No transfusion today  - Started Granix 09/02/16  ??  4.  Metabolic:  HypoCa, other electrolytes are WNL and renal fxn stable. Wt down significantly since admission  - Cont IVF hydration: D51/2NS w/10KCl @ 108mhr   - Replace potassium and magnesium per orders  ??  5. GI/Nutrition:  Mild Malnutrition w/Poor appetite & intake. Now with diarrhea.  - Cont low microbial diet - he's working with dietary to try and improve but making very little progress  - Follow closely with dietary - reconsulted to help encourage PO intake  - Encourage oral supplements - multiple discussions with pt about attempting oral intake have been had  - GERD: Cont PPI  - Diarrhea: CDiff neg 09/02/16, Imodium PRN  ??  6. Pain: Chronic back & rib pain s/p radtx; acute flare of back pain  - MS contin, up to 30 mg BID; MSIR 15 mg q4h; baclofen 20 tid  - Follows with pain specialist as outpt  - CT 08/31/16: no new pathology  ??  7. 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  8. Chronic Bronchitis/COPD: As evidenced by pre-transplant PFTs - FEV1 is 43%, He did respond to bronchodilators and went up to 52%  - Cont albuterol PRN  - Cont anoro ellipta inhaler  - Cleared for transplant by Dr. MuPercell Miller??  9. Cardiac: h/o HLD. 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 - Cleared for BMT by Dr. WhEliane Decree- HTN: Lisinopril 1051maily  ??  10. Anxiety/Depression  - Ativan PRN  - Dr. SonJavier Dockernsulted  - Started  Zoloft qhs this admit  ??  11. Insomnia:  - Ativan PRN qHS    - DVT Prophylaxis: Platelets >50,000 cells/dL, - daily lovenox prophylaxis ordered  Contraindications to pharmacologic prophylaxis: None  Contraindications to mechanical prophylaxis: None  ??  - Disposition:  Will be discharged after transplant when blood counts have recovered and he is afebrile and eating and drinking well.   ??  EdwJuliann MuleroDerrill KayD HullHCNew Waterford

## 2016-09-06 NOTE — Plan of Care (Signed)
Problem: Infection - Central Venous Catheter-Associated Bloodstream Infection:  Goal: Will show no infection signs and symptoms  Will show no infection signs and symptoms   Outcome: Ongoing  CVC site remains free of signs/symptoms of infection. No drainage, edema, erythema, pain, or warmth noted at site. Dressing changes continue per protocol and on an as needed basis - see flowsheet.           Problem: PROTECTIVE PRECAUTIONS  Goal: Patient will remain free of nosocomial Infections  Outcome: Ongoing  Pt remains in a private room. Pt is compliant with handwashing, low-microbial diet, and bathing per BCC protocol. Pt remains afebrile at this time with stable vital signs. Will continue to monitor.    Problem: Pain:  Goal: Pain level will decrease  Pain level will decrease   Outcome: Ongoing  Pt complains of chronic lower back pain throughout shift. Scheduled and prn pain medications administered per order- see eMAR. Pt tolerates well and expresses relief of pain post administration. Will continue to monitor.    Problem: Falls - Risk of:  Goal: Will remain free from falls  Will remain free from falls   Outcome: Ongoing  Orthostatic vital signs obtained at start of shift - see flowsheet for details.  Pt does not meet criteria for orthostasis.  Pt is a Med fall risk. See Leamon Arnt Fall Score and ABCDS Injury Risk assessments.   - Screening for Orthostasis AND not a High Falls Risk per MORSE/ABCDS: Pt bed is in low position, side rails up, call light and belongings are in reach.  Fall risk light is on outside pts room.  Pt encouraged to call for assistance as needed. Will continue with hourly rounds for PO intake, pain needs, toileting and repositioning as needed.     Problem: Nausea/Vomiting:  Goal: Absence of nausea/vomiting  Absence of nausea/vomiting   Outcome: Ongoing  Pt complains of persistent nausea. No episodes of vomiting noted. Scheduled zofran administered per order. Will continue to monitor.    Problem: Nutrition  Deficit:  Goal: Ability to achieve adequate nutritional intake will improve  Ability to achieve adequate nutritional intake will improve   Outcome: Ongoing      Problem: Bleeding:  Goal: Will show no signs and symptoms of excessive bleeding  Will show no signs and symptoms of excessive bleeding   Outcome: Ongoing  Patient's hemoglobin this AM:   Recent Labs      09/05/16   0315   HGB  9.7*     Patient's platelet count this AM:   Recent Labs      09/05/16   0315   PLT  16*    Thrombocytopenia Precautions in place.  Patient showing no signs or symptoms of active bleeding.  Transfusion not indicated at this time.  Patient verbalizes understanding of all instructions. Will continue to assess and implement POC. Call light within reach and hourly rounding in place.     Problem: Venous Thromboembolism:  Goal: Will show no signs or symptoms of venous thromboembolism  Will show no signs or symptoms of venous thromboembolism   Outcome: Ongoing

## 2016-09-07 ENCOUNTER — Inpatient Hospital Stay: Admit: 2016-09-07 | Primary: Hematology & Oncology

## 2016-09-07 LAB — BASIC METABOLIC PANEL
Anion Gap: 10 (ref 3–16)
BUN: 5 mg/dL — ABNORMAL LOW (ref 7–20)
CO2: 24 mmol/L (ref 21–32)
Calcium: 7.8 mg/dL — ABNORMAL LOW (ref 8.3–10.6)
Chloride: 101 mmol/L (ref 99–110)
Creatinine: 0.6 mg/dL — ABNORMAL LOW (ref 0.9–1.3)
GFR African American: >60 (ref >60)
GFR Non-African American: >60 (ref >60)
Glucose: 116 mg/dL — ABNORMAL HIGH (ref 70–99)
Potassium: 3.2 mmol/L — ABNORMAL LOW (ref 3.5–5.1)
Sodium: 135 mmol/L — ABNORMAL LOW (ref 136–145)

## 2016-09-07 LAB — HEPATIC FUNCTION PANEL
ALT: 10 U/L (ref 10–40)
AST: 9 U/L — ABNORMAL LOW (ref 15–37)
Albumin: 3 g/dL — ABNORMAL LOW (ref 3.4–5.0)
Alkaline Phosphatase: 69 U/L (ref 40–129)
Bilirubin, Direct: <0.2 mg/dL (ref 0.0–0.3)
Total Bilirubin: 0.3 mg/dL (ref 0.0–1.0)
Total Protein: 5.3 g/dL — ABNORMAL LOW (ref 6.4–8.2)

## 2016-09-07 LAB — CBC WITH AUTO DIFFERENTIAL
Hematocrit: 26.3 % — ABNORMAL LOW (ref 40.5–52.5)
Hemoglobin: 8.8 g/dL — ABNORMAL LOW (ref 13.5–17.5)
MCH: 28.6 pg (ref 26.0–34.0)
MCHC: 33.6 g/dL (ref 31.0–36.0)
MCV: 85.2 fL (ref 80.0–100.0)
MPV: 7.5 fL (ref 5.0–10.5)
Platelets: 12 10*3/uL — CL (ref 135–450)
RBC: 3.08 M/uL — ABNORMAL LOW (ref 4.20–5.90)
RDW: 13 % (ref 12.4–15.4)
WBC: 0.2 10*3/uL — CL (ref 4.0–11.0)

## 2016-09-07 LAB — PHOSPHORUS: Phosphorus: 2.5 mg/dL (ref 2.5–4.9)

## 2016-09-07 LAB — URINALYSIS
Bilirubin Urine: NEGATIVE
Blood, Urine: NEGATIVE
Glucose, Ur: NEGATIVE mg/dL
Ketones, Urine: NEGATIVE mg/dL
Leukocyte Esterase, Urine: NEGATIVE
Nitrite, Urine: NEGATIVE
Protein, UA: 30 mg/dL — AB
Specific Gravity, UA: 1.02 (ref 1.005–1.030)
Urobilinogen, Urine: 0.2 E.U./dL (ref <2.0)
pH, UA: 5.5 (ref 5.0–8.0)

## 2016-09-07 LAB — MAGNESIUM: Magnesium: 1.8 mg/dL (ref 1.80–2.40)

## 2016-09-07 LAB — MICROSCOPIC URINALYSIS

## 2016-09-07 LAB — PROTIME-INR
INR: 1.22 — ABNORMAL HIGH (ref 0.85–1.15)
Protime: 13.8 s — ABNORMAL HIGH (ref 9.6–13.0)

## 2016-09-07 LAB — URIC ACID: Uric Acid, Serum: 2.2 mg/dL — ABNORMAL LOW (ref 3.5–7.2)

## 2016-09-07 LAB — APTT: aPTT: 29.2 s (ref 24.1–34.9)

## 2016-09-07 LAB — LACTATE DEHYDROGENASE: LD: 107 U/L (ref 100–190)

## 2016-09-07 MED FILL — PANTOPRAZOLE SODIUM 40 MG PO TBEC: 40 MG | ORAL | Qty: 1

## 2016-09-07 MED FILL — ONDANSETRON HCL 4 MG/2ML IJ SOLN: 4 MG/2ML | INTRAMUSCULAR | Qty: 2

## 2016-09-07 MED FILL — CETIRIZINE HCL 10 MG PO TABS: 10 MG | ORAL | Qty: 1

## 2016-09-07 MED FILL — LOPERAMIDE HCL 2 MG PO CAPS: 2 MG | ORAL | Qty: 1

## 2016-09-07 MED FILL — LORAZEPAM 1 MG PO TABS: 1 MG | ORAL | Qty: 1

## 2016-09-07 MED FILL — ORAMORPH SR 30 MG PO TBCR: 30 mg | ORAL | Qty: 1

## 2016-09-07 MED FILL — VALACYCLOVIR HCL 500 MG PO TABS: 500 MG | ORAL | Qty: 1

## 2016-09-07 MED FILL — KCL IN DEXTROSE-NACL 10-5-0.45 MEQ/L-%-% IV SOLN: INTRAVENOUS | Qty: 1000

## 2016-09-07 MED FILL — MORPHINE SULFATE 15 MG PO TABS: 15 mg | ORAL | Qty: 1

## 2016-09-07 MED FILL — LEVAQUIN 500 MG PO TABS: 500 MG | ORAL | Qty: 1

## 2016-09-07 MED FILL — BACLOFEN 10 MG PO TABS: 10 MG | ORAL | Qty: 2

## 2016-09-07 MED FILL — POTASSIUM CHLORIDE 20 MEQ/50ML IV SOLN: 20 MEQ/50ML | INTRAVENOUS | Qty: 200

## 2016-09-07 MED FILL — SERTRALINE HCL 50 MG PO TABS: 50 MG | ORAL | Qty: 1

## 2016-09-07 MED FILL — LISINOPRIL 10 MG PO TABS: 10 MG | ORAL | Qty: 1

## 2016-09-07 MED FILL — FLUCONAZOLE 200 MG PO TABS: 200 MG | ORAL | Qty: 2

## 2016-09-07 MED FILL — GRANIX 300 MCG/0.5ML SC SOSY: 300 MCG/0.5ML | SUBCUTANEOUS | Qty: 0.5

## 2016-09-07 NOTE — Progress Notes (Signed)
Nutrition Assessment    Type and Reason for Visit: Reassess    Nutrition Recommendations:   1. Diet: monitor and encourage oral intake 4-6 small frequent meals, lower in fat and fiber, avoid Raisin bran cereal and whole diary products, Yogurt ok. Recommend oral meds w/bites of crackers, sipping Gingerale as needed. Sit upright for all meals, and then rest upright for 30 minutes after meals.   2. ONS: continue to offer Ensure supplements as tolerated  3. Will continue to monitor po intake and need for additional diet ed    Malnutrition Assessment:  ?? Malnutrition Status: Mild Malnutrition  ?? Context: Chronic illness  ?? Findings of the 6 clinical characteristics of malnutrition (Minimum of 2 out of 6 clinical characteristics is required to make the diagnosis of moderate or severe Protein Calorie Malnutrition based on AND/ASPEN Guidelines):  1. Energy Intake-Less than or equal to 50%, greater than 7 days    2. Weight Loss-5% loss or greater, in 1 week (likely fluid and nutritionally related - difficult to assess)  3. Fat Loss-No significant subcutaneous fat loss,    4. Muscle Loss-Mild muscle mass loss, Thigh (quadriceps)  5. Fluid Accumulation-No significant fluid accumulation,    6. Grip Strength-Not measured    Nutrition Diagnosis:   ?? Problem: Inadequate oral intake  ?? Etiology: related to Catabolic illness    ??? Signs and symptoms:  as evidenced by Intake 0-25%, Weight loss    Nutrition Assessment:  ?? 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 Zofran providing some relief, Lomotil + Imodium. counts not yet recovered. d/w pt dietary mgmt of diarrhea, N/V. Pt had been choosing Raisin Bran cereal and taking meds w/o food. Reviewed foods pt may tolerate best, encouraged bites of crackers w/oral meds, eating in upright position then rest for 30 minuts after meal attempt. sipping gingerale throughout the day. Pt reported  would make effort to try strategies.   ?? Nutrition-Focused Physical Findings: N/D/Dry heaves, low appetite  ?? Wound Type: None  ?? Current Nutrition Therapies:  ?? Oral Diet Orders: Low Microbial   ?? Oral Diet intake: 1-25%, Select  ?? Oral Nutrition Supplement (ONS) Orders: Standard High Calorie Oral Supplement  ?? ONS intake: 76-100% (consuming on some days)  ?? Anthropometric Measures:  ?? Ht: 5' 8.74" (174.6 cm)   ?? Current Body Wt: 190 lb 14.7 oz (86.6 kg)  ?? Admission Body Wt: 201 lb (91.2 kg)  ?? Usual Body Wt: 198 lb (89.8 kg)  ?? % Weight Change: 5%,  12 days  ?? Ideal Body Wt: 154 lb (69.9 kg),  ?? BMI Classification: BMI 25.0 - 29.9 Overweight  ?? Comparative Standards (Estimated Nutrition Needs):  ?? Estimated Daily Total Kcal: 2553-2979 kcal   ?? Estimated Daily Protein (g): 127-170 grams    Estimated Intake vs Estimated Needs: Intake Less Than Needs    Nutrition Risk Level: High    Nutrition Interventions:   Continue current diet, Continue current ONS  Continued Inpatient Monitoring, Coordination of Care, Education Completed    Nutrition Evaluation:   ?? Evaluation: No progress toward goals   ?? Goals: pt will tolerate and consume 50% or more of all meals and supplements offered     ?? Monitoring: Meal Intake, Supplement Intake, Diet Tolerance, Weight, Nausea or Vomiting, Diarrhea    See Adult Nutrition Doc Flowsheet for more detail.     Electronically signed by Brynda Peon, RD, LD  on 09/07/16 at 1:07 PM    Contact Number: [phone]

## 2016-09-07 NOTE — Plan of Care (Signed)
Problem: Infection - Central Venous Catheter-Associated Bloodstream Infection:  Goal: Will show no infection signs and symptoms  Will show no infection signs and symptoms   Outcome: Ongoing  CVC site remains free of signs/symptoms of infection. No drainage, edema, erythema, pain, or warmth noted at site. Dressing changes continue per protocol and on an as needed basis - see flowsheet.     Compliant with BCC Bath Protocol:  Performed CHG bath today per BCC protocol utilizing CHG solution in the shower.  CVC site cleansed with CHG wipe over dressing, skin surrounding dressing, and first 6" of IV tubing.  Pt tolerated well.  Continued to encourage daily CHG bathing per [facility] protocol.      Problem: PROTECTIVE PRECAUTIONS  Goal: Patient will remain free of nosocomial Infections  Outcome: Ongoing  Pt remains in neutropenic precautions per floor policy. Pt, visitors, and staff noted to be following precautions appropriately. Handwashing in place; pt wearing mask in hallway per protocol. Pt in private room. Low microbial diet in place. Will continue to monitor.    Problem: Pain:  Goal: Pain level will decrease  Pain level will decrease   Outcome: Ongoing  Pt complains of chronic back pain.  Rates 5-6/10.  Medicated with prn oxycodone per pt request (see MAR).  Pt reports adequate relief following medication administration.  Will cont to monitor.     Problem: Falls - Risk of:  Goal: Will remain free from falls  Will remain free from falls   Outcome: Ongoing  Orthostatic vital signs obtained at start of shift - see flowsheet for details.  Pt meets criteria for orthostasis.  Pt is a Med fall risk. See Leamon Arnt Fall Score and ABCDS Injury Risk assessments.     - Screening for Orthostasis AND not a High Falls Risk per MORSE/ABCDS: Pt bed is in low position, side rails up, call light and belongings are in reach.  Fall risk light is on outside pts room.  Pt encouraged to call for assistance as needed. Will continue with hourly rounds  for PO intake, pain needs, toileting and repositioning as needed.     Problem: Nausea/Vomiting:  Goal: Absence of nausea/vomiting  Absence of nausea/vomiting   Outcome: Ongoing  Pt complaining of nausea this shift gave Zofran as ordered See MAR. Will continue to monitor,     Problem: Nutrition Deficit:  Goal: Ability to achieve adequate nutritional intake will improve  Ability to achieve adequate nutritional intake will improve   Outcome: Ongoing  Encourage pt to eat small frequent meals. Offering food pt likes. Will continue to monitor.     Problem: Bleeding:  Goal: Will show no signs and symptoms of excessive bleeding  Will show no signs and symptoms of excessive bleeding   Outcome: Ongoing  Patient's hemoglobin this AM:   Recent Labs      09/07/16   0350   HGB  8.8*     Patient's platelet count this AM:   Recent Labs      09/07/16   0350   PLT  12*    Thrombocytopenia Precautions in place.  Patient showing no signs or symptoms of active bleeding.  Transfusion not indicated at this time.  Patient verbalizes understanding of all instructions. Will continue to assess and implement POC. Call light within reach and hourly rounding in place.     Problem: Venous Thromboembolism:  Goal: Will show no signs or symptoms of venous thromboembolism  Will show no signs or symptoms of venous thromboembolism  Outcome: Ongoing     Refusing DVT Prevention: Pt is at risk for DVT d/t decreased mobility and cancer treatment.  Pt educated on importance of activity. Pt has orders for SCDs while in bed, however pt currently refusing treatment.  Reviewed risks of DVT & PE development while inpatient.   Provider aware of patient's refusal and re-education of importance of prophylaxis.  No new orders at this time.  Will continue to re-instruct patient and intervene as appropriate.

## 2016-09-07 NOTE — Progress Notes (Signed)
Physical Therapy  Facility/Department: [facility]  Daily Treatment Note  NAME: ROBIN PAFFORD  DOB: 01/05/1956  MRN: [id number]    Date of Service: 09/07/2016    Patient Diagnosis(es):   Patient Active Problem List    Diagnosis Date Noted   ??? Mild malnutrition ([place]) 09/03/2016   ??? Multiple myeloma in remission ([place]) 08/26/2016   ??? COPD, severe ([place]) 08/06/2016   ??? Multiple myeloma not having achieved remission ([place]) 03/16/2016       Past Medical History:   Diagnosis Date   ??? Hyperlipidemia    ??? Hypertension    ??? Multiple myeloma ([facility])      Past Surgical History:   Procedure Laterality Date   ??? BONE MARROW BIOPSY     ??? OTHER SURGICAL HISTORY Left 08/17/2016    trifusion cath placement       Restrictions  Position Activity Restriction  Other position/activity restrictions: If platelet count equal to or less than 10 but the patient has received a platelet transfusion, physical therapy or occupational therapy may proceed with treatment. Up as tolerated  Subjective   General  Chart Reviewed: Yes  Additional Pertinent Hx: 61 y.o. M admitted 12/27 in prep for autologous stem cell transplant on 12/29. Pt w/ dx of multiple myeloma of July this year.  PMH: hyperlipidemia, HTN, multiple myeloma  Family / Caregiver Present: Yes (visitor)  Subjective  Subjective: Pt found sitting in recliner.  Seen walking halls earlier in day.  Reports feeling steady on feet.    Pain Screening  Patient Currently in Pain: Yes (back pain, chronic, not rated, RN aware)  Vital Signs  Patient Currently in Pain: Yes (back pain, chronic, not rated, RN aware)       Orientation   WFL  Objective                  Exercises  Comments: Reviewed HEP.  Pt without any questions.  Reports none of the exercises aggravate his pain.  Gets some relief with knee to chest stretches.  Re-iterated importance of transversus activation and training endurance of these muscles.  Pt verbalized understanding.                        Assessment   Assessment: Pt  continues to be plagued by chronic back pain.  Independent with mobility and able to do exercises without increase in pain.  Pt reports no concerns about returning home at d/c.  Encouraged pt to continue to increase activity as tolerated.  No further skilled PT needed at this time.  Would likely benefit from OP PT for back eventually  Patient Education: Educated pt on role of PT, importance of OOB/mobility, HEP for back.  Pt verbalized understanding  REQUIRES PT FOLLOW UP: No  PT Equipment Recommendations  Equipment Needed: No       Discharge Recommendations:    JONANTHAN BOLENDER scored a 24/24 on the AM-PAC short mobility form. Current research shows that an AM-PAC score of 18 or greater is associated with a discharge to patient's home setting. Based on the patient???s AM-PAC score and their current functional mobility deficits, it is recommended that the patient have 2-3 sessions per week of Physical Therapy at d/c to increase the patient???s independence.          G-Code     OutComes Score  AM-PAC Score  AM-PAC Inpatient Mobility Raw Score : 24  AM-PAC Inpatient T-Scale Score : 61.14  Mobility Inpatient CMS 0-100% Score: 0  Mobility Inpatient CMS G-Code Modifier : CH          Goals  Short term goals  Time Frame for Short term goals: By discharge  Short term goal 1: Pt will be independent with back exercises.  MET 1/8    Plan    Plan  Times per week: D/C Acute PT  Current Treatment Recommendations: Functional Mobility Training, Home Exercise Program, Patient/Caregiver Education & Training, Safety Education & Training  Safety Devices  Type of devices: Nurse notified, Left in chair, Call light within reach (no alarms in use)   During  [x]  PT []  OT session, pt was identified as a:   []  High  [x]  Medium []  Low fall risk using the [facility] Fall Risk Assessment tool.    It is recommended:   [x]  Continue  []  Change Patient to:   []  High  [x]   Medium  []  Low  Fall  risk.    Documentation was completed on the Fall Risk Navigator and this staff member confirmed that all appropriate fall risk protocols/practices are implemented.       Therapy Time   Individual Concurrent Group Co-treatment   Time In 5329         Time Out 1525         Minutes 10              Timed Code Treatment Minutes:10       Total Treatment Minutes:  [place], PT

## 2016-09-07 NOTE — Plan of Care (Signed)
Problem: Nutrition  Goal: Optimal nutrition therapy  Outcome: Ongoing  Nutrition Problem: Inadequate oral intake  Intervention: Food and/or Nutrient Delivery: Continue current diet, Continue current ONS  Nutritional Goals: pt will tolerate and consume 50% or more of all meals and supplements offered

## 2016-09-07 NOTE — Plan of Care (Signed)
Problem: Infection - Central Venous Catheter-Associated Bloodstream Infection:  Goal: Will show no infection signs and symptoms  Will show no infection signs and symptoms   Outcome: Ongoing  CVC site remains free of signs/symptoms of infection. No drainage, edema, erythema, pain, or warmth noted at site. Dressing changes continue per protocol and on an as needed basis - see flowsheet.     Problem: PROTECTIVE PRECAUTIONS  Goal: Patient will remain free of nosocomial Infections  Outcome: Ongoing  Pt remains in a private room. Pt is compliant with handwashing, low-microbial diet, and bathing per BCC protocol. Pt remains afebrile at this time with stable vital signs. Will continue to monitor.    Problem: Pain:  Goal: Pain level will decrease  Pain level will decrease   Outcome: Ongoing  Pt complains of chronic lower back pain throughout shift. Scheduled and prn pain medications administered per order- see eMAR. Pt tolerates well and expresses relief of pain post administration. Will continue to monitor.    Problem: Falls - Risk of:  Goal: Will remain free from falls  Will remain free from falls   Outcome: Ongoing  Orthostatic vital signs obtained at start of shift - see flowsheet for details.  Pt does not meet criteria for orthostasis.  Pt is a Med fall risk. See Lattie Corns Fall Score and ABCDS Injury Risk assessments.   - Screening for Orthostasis AND not a High Falls Risk per MORSE/ABCDS: Pt bed is in low position, side rails up, call light and belongings are in reach.  Fall risk light is on outside pts room.  Pt encouraged to call for assistance as needed. Will continue with hourly rounds for PO intake, pain needs, toileting and repositioning as needed.     Problem: Nausea/Vomiting:  Goal: Absence of nausea/vomiting  Absence of nausea/vomiting   Outcome: Ongoing  Pt complains of persistent nausea. Scheduled zofran administered per order. Will continue to monitor.    Problem: Nutrition Deficit:  Goal: Ability to  achieve adequate nutritional intake will improve  Ability to achieve adequate nutritional intake will improve   Outcome: Ongoing    Problem: Venous Thromboembolism:  Goal: Will show no signs or symptoms of venous thromboembolism  Will show no signs or symptoms of venous thromboembolism   Outcome: Ongoing

## 2016-09-07 NOTE — Progress Notes (Signed)
[facility]  Aut[facility] Progress Note    09/07/2016    David Watkins    DOB:  11-20-1955    MRN:  [id number]    Referring MD: Eulis Canner, MD  [address] [place] [address]    Subjective:  Ate yogurt, walked in hallway yesterday; + diarrhea, 7 times yesterday, 2 doses of immodium     ECOG PS:  2    Isolation:  None     Medications    Scheduled Meds:  ??? ondansetron  4 mg Intravenous Q6H   ??? levofloxacin  500 mg Oral Daily   ??? morphine  30 mg Oral BID   ??? baclofen  20 mg Oral TID   ??? sertraline  50 mg Oral Daily   ??? Tbo-Filgrastim  300 mcg Subcutaneous QPM   ??? fluconazole  400 mg Oral Daily   ??? Saline Mouthwash  15 mL Swish & Spit 4x Daily AC & HS   ??? sodium chloride flush  10 mL Intravenous 2 times per day   ??? valACYclovir  500 mg Oral BID   ??? umeclidinium-vilanterol  1 puff Inhalation Daily   ??? cetirizine  10 mg Oral Daily   ??? lisinopril  10 mg Oral Daily   ??? pantoprazole  40 mg Oral QAM AC     Continuous Infusions:  ??? sodium chloride     ??? sodium chloride     ??? dextrose 5% and 0.45% NaCl with KCl 10 mEq 100 mL/hr at 09/07/16 0557     PRN Meds:.[COMPLETED] loperamide **FOLLOWED BY** loperamide, morphine, sodium chloride, sodium chloride, alteplase, magnesium hydroxide, magnesium sulfate, potassium chloride, Saline Mouthwash, prochlorperazine **OR** prochlorperazine, LORazepam **OR** LORazepam, albuterol sulfate HFA, polyethylene glycol, LORazepam    ROS:  ?? Constitutional: Denies fever, sweats, weight loss.  ?? Eyes: No visual changes or diplopia. No scleral icterus.  ?? ENT: No Headaches, hearing loss or vertigo. No mouth sores or sore throat.  ?? Cardiovascular: No chest pain, dyspnea on exertion, palpitations or loss of consciousness.   ?? Respiratory: No cough or wheezing, no sputum production. No hemoptysis.  ?? Gastrointestinal: No abdominal pain, no blood in stools. +Diarrhea.  +Appetite loss  ?? Genitourinary: No dysuria, trouble voiding, or hematuria.  ?? Musculoskeletal:  +generalized weakness &  back pain. No joint complaints.  ?? Integumentary: No rash or pruritis.  ?? Neurological: No headache, diplopia. No change in gait, balance, or coordination. No paresthesias.  ?? Endocrine: No temperature intolerance. No excessive thirst, fluid intake, or urination.   ?? Hematologic/Lymphatic: No abnormal bruising or ecchymoses, blood clots or swollen lymph nodes.  ?? Allergic/Immunologic: No nasal congestion or hives.     Physical Exam:     I&O:      Intake/Output Summary (Last 24 hours) at 09/07/16 0705  Last data filed at 09/07/16 0603   Gross per 24 hour   Intake             3027 ml   Output             1000 ml   Net             2027 ml       Vital Signs:  BP 111/68    Pulse 84    Temp 98.4 ??F (36.9 ??C) (Oral)    Resp 16    Ht 5' 8.74" (1.746 m)    Wt 187 lb 9.6 oz (85.1 kg)    SpO2 93%  BMI 27.91 kg/m??     Weight:    Wt Readings from Last 3 Encounters:   09/06/16 187 lb 9.6 oz (85.1 kg)   08/17/16 192 lb (87.1 kg)   08/10/16 202 lb (91.6 kg)       General: Awake, alert and oriented ??  HEENT: normocephalic, alopecia, PERRL, no scleral erythema or icterus, Oral mucosa moist and intact, throat clear.   NECK: supple without palpable adenopathy  BACK: Straight, negative CVAT  SKIN: warm dry and intact without lesions rashes or masses  CHEST: CTA bilaterally without use of accessory muscles  CV: Normal S1 S2, RRR, no MRG  ABD: NT ND normoactive BS, no palpable masses or hepatosplenomegaly  EXTREMITIES: without edema, denies calf tenderness  NEURO: CN II - XII grossly intact  CATHETER: LSC Trifusion (Barrat, 08/17/16) - CDI, no erythema    Data:   CBC:   Recent Labs      09/05/16   0315  09/06/16   0335  09/07/16   0350   WBC  0.1*  0.1*  0.2*   HGB  9.7*  9.0*  8.8*   HCT  28.0*  26.6*  26.3*   MCV  85.3  85.0  85.2   PLT  16*  5*  12*     BMP/Mag:  Recent Labs      09/05/16   0315  09/06/16   0335  09/07/16   0350   NA  139  137  135*   K  3.7  3.6  3.2*   CL  106  104  101   CO2  24  23  24    PHOS   --    --   2.5    BUN  6*  6*  5*   CREATININE  0.7*  0.6*  0.6*   MG   --    --   1.80     LIVP:   Recent Labs      09/07/16   0350   AST  9*   ALT  10   BILIDIR  <0.2   BILITOT  0.3   ALKPHOS  69     Uric Acid:    Recent Labs      09/07/16   0350   LABURIC  2.2*     Coags:   Recent Labs      09/07/16   0350   PROTIME  13.8*   INR  1.22*   APTT  29.2     DIAGNOSTICS:   1. CT Thoracic & Lumbar Spine 08/31/16  Thoracic: Diffuse lytic skeletal metastatic disease compatible with  patient's clinical history of multiple myeloma, most pronounced at  the T6 and T10 vertebrae as detailed in body report. Potential  intraspinal involvement by metastatic disease at T6 and T10  levels, but no evidence for significant thecal sac compression on  noncontrast CT.  No fracture or thoracic spine infection.  Small right pleural effusion  Lumbar: Diffuse lytic skeletal metastatic disease compatible with  patient's clinical history of multiple myeloma. No fracture.  No fracture or lumbar spine infection  L5-S1 severe disc degeneration and grade I isthmic  spondylolisthesis contributing to severe bilateral L5 foraminal  stenosis.    PROBLEM LIST: ????????   ????  Multiple myeloma  Hyperlipidemia  HTN  Neoplasm related pain    Post-Transplant Complications:  1. Anorexia  ????  TREATMENT: ????????   ????  1. Rad Tx to T5-7, T10-L3, Right Scapula -  3000 cGy - Dr. Jamas Lav 03/20/16-04/01/16  2. RVD x1 03/20/16 - discontinued d/t rash   3. Velcade/Pomalyst/Dex x3 cycles 04/23/16-06/17/16 - discontinued d/t reaction to pomalyst  4. Velcade/Dex x 1 cycle 06/26/16 (last dose of dexamethasone 07/22/16  5. High-dose melphalan followed by administration of PBSCs 2.36 x10^6 cd34cells/kg on 08/28/16  ??  ASSESSMENT AND PLAN: ????????   ????  1.  IgG kappa multiple myeloma: PR after 5 cycles chemotherapy - M-spike is 0.4 & BM biopsy 2 % plasma cells  - Admitted for melphalan 237m/m2 preparative regimen followed by administration of PBSCs 2.36 x10^6 cd34cells/kg on 08/28/16. He is NOT an early  release candidate d/t his lack of social support and high anxiety    Day + 10  ??  2.  ID:  Afebrile, no evidence of infection.    - Acyclovir at discharge for VZV positive titer.    - Cont Valtrex, Diflucan & Levaquin ppx  ??  3.  Heme:  Pancytopenia from chemotherapy  - Transfuse for Hgb < 7 and Platelets < 10K  - No transfusion today  - Started Granix 09/02/16  ??  4.  Metabolic:  Mild hypoNa, HypoK/HypoCa, other electrolytes are WNL and renal fxn stable. Wt down from admission but stable the past week. Decreased uop  - Cont IVF hydration: D51/2NS w/10KCl @ 1040mhr   - Replace potassium and magnesium per orders  ??  5. GI/Nutrition:  Mild Malnutrition w/Poor appetite & intake. Diarrhea improved  - Cont low microbial diet  - Follow closely with dietary - following closely  - Encourage oral supplements - multiple discussions with pt about attempting oral intake have been had daily  - GERD: Cont PPI  - Diarrhea: CDiff neg 09/02/16, Imodium PRN  ??  6. Pain: Chronic back & rib pain s/p radtx; acute flare of back pain  - MS contin, up to 30 mg BID; MSIR 15 mg q4h; baclofen 20 tid  - Follows with pain specialist as outpt  - CT 08/31/16: no new pathology  ??  7. 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  8. Chronic Bronchitis/COPD: As evidenced by pre-transplant PFTs - FEV1 is 43%, He did respond to bronchodilators and went up to 52%  - Cont albuterol PRN  - Cont anoro ellipta inhaler  - Cleared for transplant by Dr. MuPercell Miller??  9. Cardiac: h/o HLD. 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 - Cleared for BMT by Dr. WhEliane Decree- HTN: Lisinopril 1045maily  ??  10. Anxiety/Depression  - Ativan PRN  - Dr. SonJavier Dockernsulted  - Started Zoloft qhs this admit  ??  11. Insomnia:  - Ativan PRN qHS    12. Debilitation: d/t malnutrition & back pain  - PT/OT  following    - DVT Prophylaxis: Platelets >50,000 cells/dL, - daily lovenox prophylaxis ordered  Contraindications to pharmacologic prophylaxis: None  Contraindications to mechanical prophylaxis: None  ??  - Disposition:  Will be discharged after transplant when blood counts have recovered and he is afebrile and eating and drinking well.   ??  BriMelvyn NethNP      Lenay Lovejoy A. FabDrucilla SchmidtO, MS

## 2016-09-07 NOTE — Care Coordination-Inpatient (Addendum)
Type of Admission  Multiple Myeloma IgG  Melphalan Auto ( T:0: 08/28/16)  Day +10        Central venous catheter  Left SC Trifusion Catheter ( 08/17/16, Dr. Judithann Sauger)        Plan  Proceed with Melphalan Auto SCT for treatment of Multiple Myeloma        Update  09/03/15:  Will begin daily Granix today. Reports having no appetite but still attempting.  09/07/16:  No blood count recovery as yet.  Continues to have loose stools, Lomotil added in addition to Imodium.      Education  08/26/16: Introduced myself in Clinician/Naviagtor role. Discussed use of cryotherapy with Melphalan, verbalized understanding.  Reviewed timeline of recovery, states that he has a calendar that his brothers & sisters are assisting post discharge, all if his siblings live out of town.  Confirmed local [facility]  09/02/16:  Discussed use of Granix  To assist in wbc recovery.  Reinforced timeline for recovery, days 11-14 & criteria for discharge, afebrile, ANC recovering & adequate nutrition.  09/07/16:  Team discussed with patient that should be able to predict discharge date ny Wed, so that family can assist post discharge.  09/07/16:  Spoke with Sister in [place], Austine Werito regarding readiness for home.  Patient has multiple family friends that have scheduled time for Care giver coverage.  09/07/16:  Patient  A lot of encouragement to eat, acknowledges that he has not eaten since yesterday when he ate yogurt.  Stressed that he needs to consider eating like  Scheduled medicine & that adequate nutrition is essential for discharge.  Discharge  When ANC recovers, afebrile & adequate nutrition        Pending

## 2016-09-07 NOTE — Other (Signed)
09/07/16 1205   Encounter Summary   Services provided to: Patient   Referral/Consult From: Rounding   Continue Visiting (es 1/8)   Complexity of Encounter Moderate   Length of Encounter 15 minutes   Routine   Type Follow up   Assessment Approachable   Intervention Active listening;Prayer   Outcome Receptive;Engaged in conversation

## 2016-09-08 LAB — PREPARE PLATELETS: Dispense Status Blood Bank: TRANSFUSED

## 2016-09-08 LAB — CBC WITH AUTO DIFFERENTIAL
Hematocrit: 25.2 % — ABNORMAL LOW (ref 40.5–52.5)
Hemoglobin: 8.7 g/dL — ABNORMAL LOW (ref 13.5–17.5)
MCH: 29 pg (ref 26.0–34.0)
MCHC: 34.4 g/dL (ref 31.0–36.0)
MCV: 84.2 fL (ref 80.0–100.0)
MPV: 8.2 fL (ref 5.0–10.5)
Platelets: 8 10*3/uL — CL (ref 135–450)
RBC: 2.99 M/uL — ABNORMAL LOW (ref 4.20–5.90)
RDW: 13.3 % (ref 12.4–15.4)
WBC: 0.4 10*3/uL — CL (ref 4.0–11.0)

## 2016-09-08 LAB — BASIC METABOLIC PANEL
Anion Gap: 9 (ref 3–16)
BUN: 5 mg/dL — ABNORMAL LOW (ref 7–20)
CO2: 24 mmol/L (ref 21–32)
Calcium: 7.9 mg/dL — ABNORMAL LOW (ref 8.3–10.6)
Chloride: 102 mmol/L (ref 99–110)
Creatinine: 0.6 mg/dL — ABNORMAL LOW (ref 0.9–1.3)
GFR African American: >60 (ref >60)
GFR Non-African American: >60 (ref >60)
Glucose: 111 mg/dL — ABNORMAL HIGH (ref 70–99)
Potassium: 3.6 mmol/L (ref 3.5–5.1)
Sodium: 135 mmol/L — ABNORMAL LOW (ref 136–145)

## 2016-09-08 LAB — URIC ACID: Uric Acid, Serum: 2.4 mg/dL — ABNORMAL LOW (ref 3.5–7.2)

## 2016-09-08 MED ORDER — LOPERAMIDE HCL 2 MG PO CAPS
2 | ORAL | Status: DC
Start: 2016-09-08 — End: 2016-09-15
  Administered 2016-09-08 – 2016-09-15 (×28): 4 mg via ORAL

## 2016-09-08 MED ORDER — SODIUM CHLORIDE 0.9 % IV SOLN
0.9 % | INTRAVENOUS | Status: AC
Start: 2016-09-08 — End: 2016-09-08
  Administered 2016-09-08: 11:00:00 250

## 2016-09-08 MED ORDER — SODIUM CHLORIDE 0.9 % IV BOLUS
0.9 % | Freq: Once | INTRAVENOUS | Status: DC
Start: 2016-09-08 — End: 2016-09-09

## 2016-09-08 MED ORDER — DIPHENOXYLATE-ATROPINE 2.5-0.025 MG PO TABS
ORAL | Status: DC
Start: 2016-09-08 — End: 2016-09-14
  Administered 2016-09-08 – 2016-09-14 (×24): 1 via ORAL

## 2016-09-08 MED ORDER — SODIUM CHLORIDE 0.9 % IV BOLUS
0.9 % | Freq: Once | INTRAVENOUS | Status: AC
Start: 2016-09-08 — End: 2016-09-08
  Administered 2016-09-08: 17:00:00 1000 mL via INTRAVENOUS

## 2016-09-08 MED FILL — LOPERAMIDE HCL 2 MG PO CAPS: 2 MG | ORAL | Qty: 1

## 2016-09-08 MED FILL — MORPHINE SULFATE 15 MG PO TABS: 15 mg | ORAL | Qty: 1

## 2016-09-08 MED FILL — BACLOFEN 10 MG PO TABS: 10 MG | ORAL | Qty: 2

## 2016-09-08 MED FILL — SODIUM CHLORIDE 0.9 % IV SOLN: 0.9 % | INTRAVENOUS | Qty: 250

## 2016-09-08 MED FILL — KCL IN DEXTROSE-NACL 10-5-0.45 MEQ/L-%-% IV SOLN: INTRAVENOUS | Qty: 1000

## 2016-09-08 MED FILL — SODIUM CHLORIDE 0.9 % IV SOLN: 0.9 % | INTRAVENOUS | Qty: 1000

## 2016-09-08 MED FILL — CETIRIZINE HCL 10 MG PO TABS: 10 MG | ORAL | Qty: 1

## 2016-09-08 MED FILL — ONDANSETRON HCL 4 MG/2ML IJ SOLN: 4 MG/2ML | INTRAMUSCULAR | Qty: 2

## 2016-09-08 MED FILL — LOPERAMIDE HCL 2 MG PO CAPS: 2 MG | ORAL | Qty: 2

## 2016-09-08 MED FILL — ORAMORPH SR 30 MG PO TBCR: 30 mg | ORAL | Qty: 1

## 2016-09-08 MED FILL — VALACYCLOVIR HCL 500 MG PO TABS: 500 MG | ORAL | Qty: 1

## 2016-09-08 MED FILL — LISINOPRIL 10 MG PO TABS: 10 MG | ORAL | Qty: 1

## 2016-09-08 MED FILL — DIPHENOXYLATE-ATROPINE 2.5-0.025 MG PO TABS: ORAL | Qty: 1

## 2016-09-08 MED FILL — LORAZEPAM 1 MG PO TABS: 1 MG | ORAL | Qty: 1

## 2016-09-08 MED FILL — GRANIX 300 MCG/0.5ML SC SOSY: 300 MCG/0.5ML | SUBCUTANEOUS | Qty: 0.5

## 2016-09-08 MED FILL — PANTOPRAZOLE SODIUM 40 MG PO TBEC: 40 MG | ORAL | Qty: 1

## 2016-09-08 MED FILL — LEVAQUIN 500 MG PO TABS: 500 MG | ORAL | Qty: 1

## 2016-09-08 MED FILL — FLUCONAZOLE 200 MG PO TABS: 200 MG | ORAL | Qty: 2

## 2016-09-08 MED FILL — SERTRALINE HCL 50 MG PO TABS: 50 MG | ORAL | Qty: 1

## 2016-09-08 NOTE — Plan of Care (Addendum)
Problem: Infection - Central Venous Catheter-Associated Bloodstream Infection:  Goal: Will show no infection signs and symptoms  Will show no infection signs and symptoms   Outcome: Met This Shift  CVC site remains free of signs/symptoms of infection. No drainage, edema, erythema, pain, or warmth noted at site. Dressing changes continue per protocol and on an as needed basis - see flowsheet.         Problem: PROTECTIVE PRECAUTIONS  Goal: Patient will remain free of nosocomial Infections  Outcome: Met This Shift  Patient's VSS and afebrile; exhibiting no signs or symptoms of nosocomial infections. Will continue to assess for infections and implement POC. Standard precautions in place, hand hygiene performed upon entry and exit of room, call light within reach and hourly rounding in place.     Problem: Pain:  Goal: Pain level will decrease  Pain level will decrease   Outcome: Met This Shift  Pt had no c/o pain this shift.  Will continue to monitor    Problem: Falls - Risk of:  Goal: Will remain free from falls  Will remain free from falls   Outcome: Met This Shift  Patient is alert and oriented, remains free of falls, independent and presents with a steady gait. Appropriate footwear has been worn; line has been pulled back and pinned to clothing. Bed is locked, is at the lowest level and 2 out 4 side rails are in use. Room is free of clutter; patient's belongings and call light are within reach.  Patient has been encouraged to call for assistance and has been compliant. Current nurse will continue with hourly rounds to meet patient's needs.    Problem: Nausea/Vomiting:  Goal: Absence of nausea/vomiting  Absence of nausea/vomiting   Outcome: Met This Shift  Pt continues on scheduled zofran.  Pt had no c/o nausea this shift.      Problem: Bleeding:  Goal: Will show no signs and symptoms of excessive bleeding  Will show no signs and symptoms of excessive bleeding   Outcome: Met This Shift  Pt with no s/s of active  bleeding this shift.  Will monitor morning lab results for any blood product transfusions needed per order parameters.      Problem: Venous Thromboembolism:  Goal: Will show no signs or symptoms of venous thromboembolism  Will show no signs or symptoms of venous thromboembolism   Outcome: Met This Shift  Pt is at risk for DVT d/t decreased mobility and cancer treatment.  Pt educated on importance of activity. Pt has orders for SCDs while in bed, however pt currently refusing treatment.  Reviewed risks of DVT & PE development while inpatient.   Provider aware of patient's refusal and re-education of importance of prophylaxis.  No new orders at this time.  Will continue to re-instruct patient and intervene as appropriate.

## 2016-09-08 NOTE — Progress Notes (Signed)
[name]  Autologous Progress Note    09/08/2016    David Watkins    DOB:  12-Nov-1955    MRN:  [id number]    Referring MD: Eulis Canner, [place] [address]    Subjective:  + back pain, but didn't ask for breakthru meds last night; ate twice as much; + diarrhea increasing     ECOG PS:  2    Isolation:  None     Medications    Scheduled Meds:  ??? sodium chloride  250 mL Intravenous Once   ??? ondansetron  4 mg Intravenous Q6H   ??? levofloxacin  500 mg Oral Daily   ??? morphine  30 mg Oral BID   ??? baclofen  20 mg Oral TID   ??? sertraline  50 mg Oral Daily   ??? Tbo-Filgrastim  300 mcg Subcutaneous QPM   ??? fluconazole  400 mg Oral Daily   ??? Saline Mouthwash  15 mL Swish & Spit 4x Daily AC & HS   ??? sodium chloride flush  10 mL Intravenous 2 times per day   ??? valACYclovir  500 mg Oral BID   ??? umeclidinium-vilanterol  1 puff Inhalation Daily   ??? cetirizine  10 mg Oral Daily   ??? lisinopril  10 mg Oral Daily   ??? pantoprazole  40 mg Oral QAM AC     Continuous Infusions:  ??? sodium chloride     ??? sodium chloride     ??? dextrose 5% and 0.45% NaCl with KCl 10 mEq 100 mL/hr at 09/08/16 0405     PRN Meds:.[COMPLETED] loperamide **FOLLOWED BY** loperamide, morphine, sodium chloride, sodium chloride, alteplase, magnesium hydroxide, magnesium sulfate, potassium chloride, Saline Mouthwash, prochlorperazine **OR** prochlorperazine, LORazepam **OR** LORazepam, albuterol sulfate HFA, polyethylene glycol, LORazepam    ROS:  ?? Constitutional: Denies fever, sweats, weight loss.  ?? Eyes: No visual changes or diplopia. No scleral icterus.  ?? ENT: No Headaches, hearing loss or vertigo. No mouth sores or sore throat.  ?? Cardiovascular: No chest pain, dyspnea on exertion, palpitations or loss of consciousness.   ?? Respiratory: No cough or wheezing, no sputum production. No hemoptysis.  ?? Gastrointestinal: No abdominal pain, no blood in stools. +Diarrhea.  +Appetite loss  ?? Genitourinary: No dysuria, trouble voiding, or  hematuria.  ?? Musculoskeletal:  +generalized weakness & back pain. No joint complaints.  ?? Integumentary: No rash or pruritis.  ?? Neurological: No headache, diplopia. No change in gait, balance, or coordination. No paresthesias.  ?? Endocrine: No temperature intolerance. No excessive thirst, fluid intake, or urination.   ?? Hematologic/Lymphatic: No abnormal bruising or ecchymoses, blood clots or swollen lymph nodes.  ?? Allergic/Immunologic: No nasal congestion or hives.     Physical Exam:     I&O:      Intake/Output Summary (Last 24 hours) at 09/08/16 0804  Last data filed at 09/08/16 0609   Gross per 24 hour   Intake             3046 ml   Output              900 ml   Net             2146 ml       Vital Signs:  BP 102/64    Pulse 88    Temp 98.4 ??F (36.9 ??C) (Oral)    Resp 18    Ht 5' 8.74" (1.746 m)  Wt 190 lb 14.4 oz (86.6 kg)    SpO2 95%    BMI 28.40 kg/m??     Weight:    Wt Readings from Last 3 Encounters:   09/07/16 190 lb 14.4 oz (86.6 kg)   08/17/16 192 lb (87.1 kg)   08/10/16 202 lb (91.6 kg)       General: Awake, alert and oriented ??  HEENT: normocephalic, alopecia, PERRL, no scleral erythema or icterus, Oral mucosa moist and intact, throat clear.   NECK: supple without palpable adenopathy  BACK: Straight, negative CVAT  SKIN: warm dry and intact without lesions rashes or masses  CHEST: CTA bilaterally without use of accessory muscles  CV: Normal S1 S2, RRR, no MRG  ABD: NT ND normoactive BS, no palpable masses or hepatosplenomegaly  EXTREMITIES: without edema, denies calf tenderness  NEURO: CN II - XII grossly intact  CATHETER: LSC Trifusion (Barrat, 08/17/16) - CDI, no erythema    Data:   CBC:   Recent Labs      09/06/16   0335  09/07/16   0350  09/08/16   0400   WBC  0.1*  0.2*  0.4*   HGB  9.0*  8.8*  8.7*   HCT  26.6*  26.3*  25.2*   MCV  85.0  85.2  84.2   PLT  5*  12*  8*     BMP/Mag:  Recent Labs      09/06/16   0335  09/07/16   0350  09/08/16   0400   NA  137  135*  135*   K  3.6  3.2*  3.6   CL   104  101  102   CO2  23  24  24    PHOS   --   2.5   --    BUN  6*  5*  5*   CREATININE  0.6*  0.6*  0.6*   MG   --   1.80   --      LIVP:   Recent Labs      09/07/16   0350   AST  9*   ALT  10   BILIDIR  <0.2   BILITOT  0.3   ALKPHOS  69     Uric Acid:    Recent Labs      09/08/16   0400   LABURIC  2.4*     Coags:   Recent Labs      09/07/16   0350   PROTIME  13.8*   INR  1.22*   APTT  29.2     DIAGNOSTICS:   1. CT Thoracic & Lumbar Spine 08/31/16  Thoracic: Diffuse lytic skeletal metastatic disease compatible with  patient's clinical history of multiple myeloma, most pronounced at  the T6 and T10 vertebrae as detailed in body report. Potential  intraspinal involvement by metastatic disease at T6 and T10  levels, but no evidence for significant thecal sac compression on  noncontrast CT.  No fracture or thoracic spine infection.  Small right pleural effusion  Lumbar: Diffuse lytic skeletal metastatic disease compatible with  patient's clinical history of multiple myeloma. No fracture.  No fracture or lumbar spine infection  L5-S1 severe disc degeneration and grade I isthmic  spondylolisthesis contributing to severe bilateral L5 foraminal  stenosis.    PROBLEM LIST: ????????   ????  Multiple myeloma  Hyperlipidemia  HTN  Neoplasm related pain    Post-Transplant Complications:  1. Anorexia  ????  TREATMENT: ????????   ????  1. Rad Tx to T5-7, T10-L3, Right Scapula - 3000 cGy - Dr. Jamas Lav 03/20/16-04/01/16  2. RVD x1 03/20/16 - discontinued d/t rash   3. Velcade/Pomalyst/Dex x3 cycles 04/23/16-06/17/16 - discontinued d/t reaction to pomalyst  4. Velcade/Dex x 1 cycle 06/26/16 (last dose of dexamethasone 07/22/16  5. High-dose melphalan followed by administration of PBSCs 2.36 x10^6 cd34cells/kg on 08/28/16  ??  ASSESSMENT AND PLAN: ????????   ????  1.  IgG kappa multiple myeloma: PR after 5 cycles chemotherapy - M-spike is 0.4 & BM biopsy 2 % plasma cells  - Admitted for melphalan 235m/m2 preparative regimen followed by administration of PBSCs  2.36 x10^6 cd34cells/kg on 08/28/16. He is NOT an early release candidate d/t his lack of social support and high anxiety    Day + 11  ??  2.  ID:  Afebrile, no evidence of infection.    - Acyclovir at discharge for VZV positive titer.    - Cont Valtrex, Diflucan & Levaquin ppx  ??  3.  Heme:  Pancytopenia from chemotherapy  - Transfuse for Hgb < 7 and Platelets < 10K  - Plt transfusion today  - Started Granix 09/02/16  ??  4.  Metabolic:  Mild hypoNa, HypoK/HypoCa, other electrolytes are WNL and renal fxn stable. Wt down from admission but stable the past week. No uop documented yesterday  - Cont IVF hydration: D51/2NS w/10KCl @ 1086mhr ; 1L fluid bolus today  - Replace potassium and magnesium per orders  - Strict I/O   ??  5. GI/Nutrition:  Mild Malnutrition w/Poor appetite & intake. Diarrhea improved  - Cont low microbial diet  - Follow closely with dietary  - Encourage oral supplements - multiple discussions with pt about attempting oral intake have been had daily  - GERD: Cont PPI  - Diarrhea: CDiff neg 09/02/16.  >1L output last 24 hours - Will schedule imodium & lomotil 09/08/16  ??  6. Pain: Chronic back & rib pain s/p radtx; acute flare of back pain  - MS contin 30 mg BID; MSIR 15 mg q4h; baclofen 20 tid  - CT 08/31/16: no new pathology  ??  7. 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  8. Chronic Bronchitis/COPD: As evidenced by pre-transplant PFTs - FEV1 is 43%, He did respond to bronchodilators and went up to 52%  - Cont albuterol PRN  - Cont anoro ellipta inhaler  - Cleared for transplant by Dr. MuPercell Miller??  9. Cardiac: h/o HLD. 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 - Cleared for BMT by Dr. WhEliane Decree- HTN: Lisinopril 1053maily, hold today due to low BP's  ??  10. Anxiety/Depression  - Ativan PRN  - Dr. SonJavier Dockernsulted  - Started  Zoloft qhs this admit  ??  11. Insomnia:  - Ativan PRN qHS    12. Debilitation: d/t malnutrition & back pain  - PT/OT signed off    - DVT Prophylaxis: Platelets >50,000 cells/dL, - daily lovenox prophylaxis ordered  Contraindications to pharmacologic prophylaxis: None  Contraindications to mechanical prophylaxis: None  ??  - Disposition:  Will be discharged after transplant when blood counts have recovered and he is afebrile and eating and drinking well.   ??      BriMelvyn Neth  CNP        Peniel Biel A. Drucilla Schmidt, DO, MS

## 2016-09-08 NOTE — Plan of Care (Addendum)
Problem: Infection - Central Venous Catheter-Associated Bloodstream Infection:  Goal: Will show no infection signs and symptoms  Will show no infection signs and symptoms   Outcome: Ongoing  CVC site is without signs or symptoms of infection. Dressing changed. CVC site remains free of signs/symptoms of infection. No drainage, edema, erythema, pain, or warmth noted at site. Dressing changes continue per protocol and on an as needed basis - see flowsheet.     Compliant with BCC Bath Protocol:  Performed CHG bath today per BCC protocol utilizing CHG solution in the shower.    Pt tolerated well.  Continued to encourage daily CHG bathing per [facility] protocol.        Problem: PROTECTIVE PRECAUTIONS  Goal: Patient will remain free of nosocomial Infections  Outcome: Ongoing  Patient remains afebrile. Protective precautions in place. Patient verbalizes and demonstrates understanding of protective precautions.    Problem: Pain:  Goal: Pain level will decrease  Pain level will decrease   Outcome: Ongoing  Patient complains of lower back pain. Thermacare applied and pain meds given. Patient verbalizes tolerable relief of pain.    Problem: Falls - Risk of:  Goal: Will remain free from falls  Will remain free from falls   Outcome: Ongoing  Orthostatic vital signs obtained at start of shift - see flowsheet for details.  Pt does not meet criteria for orthostasis.  Pt is a Med fall risk. See Leamon Arnt Fall Score and ABCDS Injury Risk assessments.      - Screening for Orthostasis AND not a High Falls Risk per MORSE/ABCDS: Pt bed is in low position, side rails up, call light and belongings are in reach.  Fall risk light is on outside pts room.  Pt encouraged to call for assistance as needed. Will continue with hourly rounds for PO intake, pain needs, toileting and repositioning as needed.  Patient with steady gait. Patient walked hall this afternoon and tolerated well.      Problem: Nausea/Vomiting:  Goal: Absence of nausea/vomiting  Absence  of nausea/vomiting   Outcome: Ongoing  No complaints of nausea. Patient states not having appetite. Patient ate couple bites of rice with broth. Diarrhea has slowed some since yesterday. Stool is a yellow and watery. Imodium and lomotil given as ordered.    Problem: Bleeding:  Goal: Will show no signs and symptoms of excessive bleeding  Will show no signs and symptoms of excessive bleeding   Outcome: Ongoing  Patient's hemoglobin this AM:   Recent Labs      09/08/16   0400   HGB  8.7*     Patient's platelet count this AM:   Recent Labs      09/08/16   0400   PLT  8*    Thrombocytopenia Precautions in place.  Patient showing no signs or symptoms of active bleeding.  Patient received transfusions per orders prior to this shift.  Patient verbalizes understanding of all instructions. Will continue to assess and implement POC. Call light within reach and hourly rounding in place.

## 2016-09-08 NOTE — Other (Addendum)
Patient Acct Nbr:  [id number]  Primary AUTH/CERT:  [id number]  [place] Name:   [place]  Primary Insurance Plan Name:  [place]  Primary Insurance Group Number:    Primary Insurance Plan Type: B  Primary Insurance Policy Number:  [id number]    Secondary AUTH/CERT:    [place] Name:   Manufacturing systems engineer ACCESS/FEDERAL (R)  Secondary Insurance Plan Name:  ANTHEM Oren Binet ACCESS/FEDERAL(R)  Secondary Insurance Group Number:    Secondary Insurance Plan Type: B  Secondary Insurance Policy Number:  [id number]

## 2016-09-08 NOTE — Care Coordination-Inpatient (Signed)
SW faxed information to [employer] for patient's short term disability and also got an itemized bill from November 7 which he requested.    Freddi Starr, MSW, [place]

## 2016-09-08 NOTE — Care Coordination-Inpatient (Signed)
Type of Admission  Multiple Myeloma IgG  Melphalan Auto ( T:0: 08/28/16)  Day +11      Central venous catheter  Left SC Trifusion Catheter ( 08/17/16, Dr. Lizbeth Bark)        Plan  Proceed with Melphalan Auto SCT for treatment of Multiple Myeloma        Update  09/03/15:  Will begin daily Granix today. Reports having no appetite but still attempting.  09/07/16:  No blood count recovery as yet.  Continues to have loose stools, Lomotil added in addition to Imodium.  09/08/16:  Continue to communicate with brother, Harrie Jeans regarding impending discharge. Patient had >1 liter of stool in 24 hours, imodium & lomotil scheduled.      Education  08/26/16: Introduced myself in Clinician/Naviagtor role. Discussed use of cryotherapy with Melphalan, verbalized understanding.  Reviewed timeline of recovery, states that he has a calendar that his brothers & sisters are assisting post discharge, all if his siblings live out of town.  Confirmed local [place]  09/02/16:  Discussed use of Granix  To assist in wbc recovery.  Reinforced timeline for recovery, days 11-14 & criteria for discharge, afebrile, ANC recovering & adequate nutrition.  09/07/16:  Team discussed with patient that should be able to predict discharge date ny Wed, so that family can assist post discharge.  09/07/16:  Spoke with Sister in [place], Michaeljoseph Revolorio regarding readiness for home.  Patient has multiple family friends that have scheduled time for Care giver coverage.  09/07/16:  Patient  A lot of encouragement to eat, acknowledges that he has not eaten since yesterday when he ate yogurt.  Stressed that he needs to consider eating like  Scheduled medicine & that adequate nutrition is essential for discharge.  Discharge  When [place] recovers, afebrile & adequate nutrition        Pending

## 2016-09-09 LAB — BASIC METABOLIC PANEL
Anion Gap: 11 (ref 3–16)
BUN: 3 mg/dL — ABNORMAL LOW (ref 7–20)
CO2: 24 mmol/L (ref 21–32)
Calcium: 7.9 mg/dL — ABNORMAL LOW (ref 8.3–10.6)
Chloride: 102 mmol/L (ref 99–110)
Creatinine: 0.6 mg/dL — ABNORMAL LOW (ref 0.9–1.3)
GFR African American: >60 (ref >60)
GFR Non-African American: >60 (ref >60)
Glucose: 108 mg/dL — ABNORMAL HIGH (ref 70–99)
Potassium: 3.2 mmol/L — ABNORMAL LOW (ref 3.5–5.1)
Sodium: 137 mmol/L (ref 136–145)

## 2016-09-09 LAB — URIC ACID: Uric Acid, Serum: 2.3 mg/dL — ABNORMAL LOW (ref 3.5–7.2)

## 2016-09-09 LAB — CBC WITH AUTO DIFFERENTIAL
Basophils %: 0 %
Basophils Absolute: 0 10*3/uL (ref 0.0–0.2)
Eosinophils %: 0.2 %
Eosinophils Absolute: 0 10*3/uL (ref 0.0–0.6)
Hematocrit: 25.9 % — ABNORMAL LOW (ref 40.5–52.5)
Hemoglobin: 9 g/dL — ABNORMAL LOW (ref 13.5–17.5)
Lymphocytes %: 39.3 %
Lymphocytes Absolute: 0.4 10*3/uL — ABNORMAL LOW (ref 1.0–5.1)
MCH: 29.2 pg (ref 26.0–34.0)
MCHC: 34.7 g/dL (ref 31.0–36.0)
MCV: 84.1 fL (ref 80.0–100.0)
MPV: 8.7 fL (ref 5.0–10.5)
Monocytes %: 25.1 %
Monocytes Absolute: 0.2 10*3/uL (ref 0.0–1.3)
Neutrophils %: 35.4 %
Neutrophils Absolute: 0.3 10*3/uL — CL (ref 1.7–7.7)
Platelets: 27 10*3/uL — ABNORMAL LOW (ref 135–450)
RBC: 3.08 M/uL — ABNORMAL LOW (ref 4.20–5.90)
RDW: 12.7 % (ref 12.4–15.4)
WBC: 1 10*3/uL — ABNORMAL LOW (ref 4.0–11.0)

## 2016-09-09 LAB — HEPATIC FUNCTION PANEL
ALT: 8 U/L — ABNORMAL LOW (ref 10–40)
AST: 9 U/L — ABNORMAL LOW (ref 15–37)
Albumin: 3.4 g/dL (ref 3.4–5.0)
Alkaline Phosphatase: 68 U/L (ref 40–129)
Bilirubin, Direct: <0.2 mg/dL (ref 0.0–0.3)
Total Bilirubin: 0.4 mg/dL (ref 0.0–1.0)
Total Protein: 5.6 g/dL — ABNORMAL LOW (ref 6.4–8.2)

## 2016-09-09 LAB — PHOSPHORUS: Phosphorus: 2.3 mg/dL — ABNORMAL LOW (ref 2.5–4.9)

## 2016-09-09 LAB — LACTATE DEHYDROGENASE: LD: 114 U/L (ref 100–190)

## 2016-09-09 MED ORDER — KCL IN DEXTROSE-NACL 30-5-0.45 MEQ/L-%-% IV SOLN
INTRAVENOUS | Status: DC
Start: 2016-09-09 — End: 2016-09-13
  Administered 2016-09-09 – 2016-09-13 (×11): via INTRAVENOUS

## 2016-09-09 MED ORDER — SODIUM CHLORIDE 0.9 % IV BOLUS
0.9 % | Freq: Once | INTRAVENOUS | Status: AC
Start: 2016-09-09 — End: 2016-09-09
  Administered 2016-09-09: 16:00:00 1000 mL via INTRAVENOUS

## 2016-09-09 MED ORDER — POTASSIUM CHLORIDE CRYS ER 20 MEQ PO TBCR
20 MEQ | Freq: Every day | ORAL | Status: DC
Start: 2016-09-09 — End: 2016-09-11
  Administered 2016-09-10 – 2016-09-11 (×2): 40 meq via ORAL

## 2016-09-09 MED ORDER — ALBUTEROL SULFATE (2.5 MG/3ML) 0.083% IN NEBU
RESPIRATORY_TRACT | Status: DC
Start: 2016-09-09 — End: 2016-09-09

## 2016-09-09 MED ORDER — POTASSIUM CHLORIDE 2 MEQ/ML IV SOLN
2 MEQ/ML | INTRAVENOUS | Status: DC
Start: 2016-09-09 — End: 2016-09-09

## 2016-09-09 MED FILL — GRANIX 300 MCG/0.5ML SC SOSY: 300 MCG/0.5ML | SUBCUTANEOUS | Qty: 0.5

## 2016-09-09 MED FILL — DIPHENOXYLATE-ATROPINE 2.5-0.025 MG PO TABS: ORAL | Qty: 1

## 2016-09-09 MED FILL — BACLOFEN 10 MG PO TABS: 10 MG | ORAL | Qty: 2

## 2016-09-09 MED FILL — LOPERAMIDE HCL 2 MG PO CAPS: 2 MG | ORAL | Qty: 2

## 2016-09-09 MED FILL — FLUCONAZOLE 200 MG PO TABS: 200 MG | ORAL | Qty: 2

## 2016-09-09 MED FILL — CETIRIZINE HCL 10 MG PO TABS: 10 MG | ORAL | Qty: 1

## 2016-09-09 MED FILL — POTASSIUM CHLORIDE 20 MEQ/50ML IV SOLN: 20 MEQ/50ML | INTRAVENOUS | Qty: 200

## 2016-09-09 MED FILL — SERTRALINE HCL 50 MG PO TABS: 50 MG | ORAL | Qty: 1

## 2016-09-09 MED FILL — SODIUM CHLORIDE 0.9 % IV SOLN: 0.9 % | INTRAVENOUS | Qty: 250

## 2016-09-09 MED FILL — KCL IN DEXTROSE-NACL 30-5-0.45 MEQ/L-%-% IV SOLN: INTRAVENOUS | Qty: 1000

## 2016-09-09 MED FILL — VALACYCLOVIR HCL 500 MG PO TABS: 500 MG | ORAL | Qty: 1

## 2016-09-09 MED FILL — PANTOPRAZOLE SODIUM 40 MG PO TBEC: 40 MG | ORAL | Qty: 1

## 2016-09-09 MED FILL — LORAZEPAM 1 MG PO TABS: 1 MG | ORAL | Qty: 1

## 2016-09-09 MED FILL — ONDANSETRON HCL 4 MG/2ML IJ SOLN: 4 MG/2ML | INTRAMUSCULAR | Qty: 2

## 2016-09-09 MED FILL — SODIUM CHLORIDE 0.9 % IV SOLN: 0.9 % | INTRAVENOUS | Qty: 1000

## 2016-09-09 MED FILL — MORPHINE SULFATE 15 MG PO TABS: 15 mg | ORAL | Qty: 1

## 2016-09-09 MED FILL — ORAMORPH SR 30 MG PO TBCR: 30 mg | ORAL | Qty: 1

## 2016-09-09 MED FILL — KCL IN DEXTROSE-NACL 10-5-0.45 MEQ/L-%-% IV SOLN: INTRAVENOUS | Qty: 1000

## 2016-09-09 MED FILL — LEVAQUIN 500 MG PO TABS: 500 MG | ORAL | Qty: 1

## 2016-09-09 NOTE — Progress Notes (Signed)
[place]  Autologous Progress Note    09/09/2016    David Watkins    DOB:  09/11/1955    MRN:  [id number]    Referring MD: Eulis Canner, [place] [address]    Subjective:  Standing today, back pain at baseline; + diarrhea - fluid bolus yesterday     ECOG PS:  2    Isolation:  None     Medications    Scheduled Meds:  ??? sodium chloride  250 mL Intravenous Once   ??? loperamide  4 mg Oral 4 times per day   ??? diphenoxylate-atropine  1 tablet Oral 4 times per day   ??? ondansetron  4 mg Intravenous Q6H   ??? levofloxacin  500 mg Oral Daily   ??? morphine  30 mg Oral BID   ??? baclofen  20 mg Oral TID   ??? sertraline  50 mg Oral Daily   ??? Tbo-Filgrastim  300 mcg Subcutaneous QPM   ??? fluconazole  400 mg Oral Daily   ??? Saline Mouthwash  15 mL Swish & Spit 4x Daily AC & HS   ??? sodium chloride flush  10 mL Intravenous 2 times per day   ??? valACYclovir  500 mg Oral BID   ??? umeclidinium-vilanterol  1 puff Inhalation Daily   ??? cetirizine  10 mg Oral Daily   ??? pantoprazole  40 mg Oral QAM AC     Continuous Infusions:  ??? sodium chloride     ??? sodium chloride     ??? dextrose 5% and 0.45% NaCl with KCl 10 mEq 100 mL/hr at 09/08/16 2257     PRN Meds:.morphine, sodium chloride, sodium chloride, alteplase, magnesium hydroxide, magnesium sulfate, potassium chloride, Saline Mouthwash, prochlorperazine **OR** prochlorperazine, LORazepam **OR** LORazepam, albuterol sulfate HFA, polyethylene glycol, LORazepam    ROS:  ?? Constitutional: Denies fever, sweats, weight loss.  ?? Eyes: No visual changes or diplopia. No scleral icterus.  ?? ENT: No Headaches, hearing loss or vertigo. No mouth sores or sore throat.  ?? Cardiovascular: No chest pain, dyspnea on exertion, palpitations or loss of consciousness.   ?? Respiratory: No cough or wheezing, no sputum production. No hemoptysis.  ?? Gastrointestinal: No abdominal pain, no blood in stools. +Diarrhea.  +Appetite loss  ?? Genitourinary: No dysuria, trouble voiding, or  hematuria.  ?? Musculoskeletal:  +generalized weakness & back pain. No joint complaints.  ?? Integumentary: No rash or pruritis.  ?? Neurological: No headache, diplopia. No change in gait, balance, or coordination. No paresthesias.  ?? Endocrine: No temperature intolerance. No excessive thirst, fluid intake, or urination.   ?? Hematologic/Lymphatic: No abnormal bruising or ecchymoses, blood clots or swollen lymph nodes.  ?? Allergic/Immunologic: No nasal congestion or hives.       Physical Exam:     I&O:      Intake/Output Summary (Last 24 hours) at 09/09/16 0727  Last data filed at 09/09/16 0630   Gross per 24 hour   Intake             4007 ml   Output             2250 ml   Net             1757 ml       Vital Signs:  BP (!) 95/59    Pulse 88    Temp 98.7 ??F (37.1 ??C) (Oral)    Resp 20    Ht  5' 8.74" (1.746 m)    Wt 190 lb 14.4 oz (86.6 kg)    SpO2 94%    BMI 28.40 kg/m??     Weight:    Wt Readings from Last 3 Encounters:   09/07/16 190 lb 14.4 oz (86.6 kg)   08/17/16 192 lb (87.1 kg)   08/10/16 202 lb (91.6 kg)       General: Awake, alert and oriented ??  HEENT: normocephalic, alopecia, PERRL, no scleral erythema or icterus, Oral mucosa moist and intact, throat clear.   NECK: supple without palpable adenopathy  BACK: Straight, negative CVAT  SKIN: warm dry and intact without lesions rashes or masses  CHEST: CTA bilaterally without use of accessory muscles  CV: Normal S1 S2, RRR, no MRG  ABD: NT ND normoactive BS, no palpable masses or hepatosplenomegaly  EXTREMITIES: without edema, denies calf tenderness  NEURO: CN II - XII grossly intact  CATHETER: LSC Trifusion ([place], 08/17/16) - CDI, no erythema      Data:   CBC:   Recent Labs      09/07/16   0350  09/08/16   0400  09/09/16   0318   WBC  0.2*  0.4*  1.0*   HGB  8.8*  8.7*  9.0*   HCT  26.3*  25.2*  25.9*   MCV  85.2  84.2  84.1   PLT  12*  8*  27*     BMP/Mag:  Recent Labs      09/07/16   0350  09/08/16   0400  09/09/16   0318   NA  135*  135*  137   K  3.2*  3.6   3.2*   CL  101  102  102   CO2  24  24  24    PHOS  2.5   --   2.3*   BUN  5*  5*  3*   CREATININE  0.6*  0.6*  0.6*   MG  1.80   --    --      LIVP:   Recent Labs      09/07/16   0350  09/09/16   0318   AST  9*  9*   ALT  10  8*   BILIDIR  <0.2  <0.2   BILITOT  0.3  0.4   ALKPHOS  69  68     Uric Acid:    Recent Labs      09/09/16   0318   LABURIC  2.3*     Coags:   Recent Labs      09/07/16   0350   PROTIME  13.8*   INR  1.22*   APTT  29.2     DIAGNOSTICS:   1. CT Thoracic & Lumbar Spine 08/31/16  Thoracic: Diffuse lytic skeletal metastatic disease compatible with  patient's clinical history of multiple myeloma, most pronounced at  the T6 and T10 vertebrae as detailed in body report. Potential  intraspinal involvement by metastatic disease at T6 and T10  levels, but no evidence for significant thecal sac compression on  noncontrast CT.  No fracture or thoracic spine infection.  Small right pleural effusion  Lumbar: Diffuse lytic skeletal metastatic disease compatible with  patient's clinical history of multiple myeloma. No fracture.  No fracture or lumbar spine infection  L5-S1 severe disc degeneration and grade I isthmic  spondylolisthesis contributing to severe bilateral L5 foraminal  stenosis.    PROBLEM LIST: ????????   ????  Multiple myeloma  Hyperlipidemia  HTN  Neoplasm related pain    Post-Transplant Complications:  1. Anorexia  2. Grade 3 Diarrhea  ????  TREATMENT: ????????   ????  1. Rad Tx to T5-7, T10-L3, Right Scapula - 3000 cGy - Dr. Jamas Lav 03/20/16-04/01/16  2. RVD x1 03/20/16 - discontinued d/t rash   3. Velcade/Pomalyst/Dex x3 cycles 04/23/16-06/17/16 - discontinued d/t reaction to pomalyst  4. Velcade/Dex x 1 cycle 06/26/16 (last dose of dexamethasone 07/22/16  5. High-dose melphalan followed by administration of PBSCs 2.36 x10^6 cd34cells/kg on 08/28/16  ??  ASSESSMENT AND PLAN: ????????   ????  1.  IgG kappa multiple myeloma: PR after 5 cycles chemotherapy - M-spike is 0.4 & BM biopsy 2 % plasma cells  - Admitted for  melphalan 262m/m2 preparative regimen followed by administration of PBSCs 2.36 x10^6 cd34cells/kg on 08/28/16. He is NOT an early release candidate d/t his lack of social support and high anxiety    Day + 12  ??  2.  ID:  Afebrile, no evidence of infection.    - Acyclovir at discharge for VZV positive titer.    - Cont Valtrex, Diflucan & Levaquin ppx  ??  3.  Heme:  Pancytopenia from chemotherapy, ANC [place]ing  - Transfuse for Hgb < 7 and Platelets < 10K  - No transfusion today  - Started Granix 09/02/16  ??  4.  Metabolic: HypoK/HypoCa/HypoPhos, other electrolytes are WNL and renal fxn stable. Wt down from admission but stable the past week. Decreased uop (per RN report, pt continues to urinate in toilet)  - Increase KCl in IVF hydration: D51/2NS w/30KCl @ 1542mhr, likely hypokalemia from fluid losses r/t diarrhea  - Replace potassium and magnesium per orders  - Strict I/O   - Check Phos daily  - add oral potassium bid today  ??  5. GI/Nutrition:  Mild Malnutrition w/Poor appetite & intake, but attempting to increase. Ongoing diarrhea  - Cont low microbial diet  - Follow closely with dietary  - Encourage oral supplements - multiple discussions with pt about attempting oral intake have been had daily  - GERD: Cont PPI  - Diarrhea: CDiff neg 09/02/16.  Cont to have large volume diarrhea.  Cont scheduled imodium & lomotil alternating (started 09/08/16).  No abdominal cramps or blood in stool  ??  6. Pain: Chronic back & rib pain s/p radtx; acute flare of back pain  - MS contin 30 mg BID; MSIR 15 mg q4h; baclofen 20 tid  - CT 08/31/16: no new pathology  ??  7. 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  8. Chronic Bronchitis/COPD: As evidenced by pre-transplant PFTs - FEV1 is 43%, He did respond to bronchodilators and went up to 52%  - Cont albuterol PRN  - Cont anoro ellipta inhaler  - Cleared for transplant by Dr. MuPercell Miller??  9. Cardiac: h/o HLD. 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 - Cleared for BMT by Dr. WhEliane Decree- HTN: Lisinopril 1020maily, hold today due to low BP's  ??  10. Anxiety/Depression  - Ativan PRN  - Dr. SonJavier Dockernsulted  - Started Zoloft qhs this admit  ??  11. Insomnia:  - Ativan PRN qHS    12. Debilitation: d/t malnutrition & back pain  - PT/OT  signed off.  Pt doing well.      - DVT Prophylaxis: Platelets <50,000 cells/dL - prophylactic lovenox on hold and mechanical prophylaxis with bilateral SCDs while in bed in place.  Contraindications to pharmacologic prophylaxis: Thrombocytopenia  Contraindications to mechanical prophylaxis: None    - Disposition:  Will be discharged after transplant when blood counts have recovered and he is afebrile and eating and drinking well.   ??      Melvyn Neth, CNP        [place] Jessie A. Drucilla Schmidt, DO, MS

## 2016-09-09 NOTE — Plan of Care (Signed)
Problem: Infection - Central Venous Catheter-Associated Bloodstream Infection:  Goal: Will show no infection signs and symptoms  Will show no infection signs and symptoms   Outcome: Ongoing  CVC site remains free of signs/symptoms of infection. No drainage, edema, erythema, pain, or warmth noted at site. Dressing changes continue per protocol and on an as needed basis - see flowsheet.     Compliant with BCC Bath Protocol:  Performed CHG bath today per [facility]CC protocol utilizing CHG solution in the shower.  CVC site cleansed with CHG wipe over dressing, skin surrounding dressing, and first 6" of IV tubing.  Pt tolerated well.  Continued to encourage daily CHG bathing per [facility] protocol.    Problem: PROTECTIVE PRECAUTIONS  Goal: Patient will remain free of nosocomial Infections  Outcome: Ongoing  Pt remains free from nosocomial infections at this time.  Will continue to monitor.     Problem: Pain:  Goal: Control of chronic pain  Control of chronic pain   Outcome: Ongoing  Pt has complaints of chronic back pain throughout shift.  Pain medication offered, pt also taking scheduled pain medication.  Will continue to monitor.     Problem: Falls - Risk of:  Goal: Will remain free from falls  Will remain free from falls   Outcome: Ongoing  Pt remains free from falls throughout shift.  Pt is up ad lib with steady gait and calls out appropriately for assistance.  Will continue to monitor.     Problem: Nausea/Vomiting:  Goal: Absence of nausea/vomiting  Absence of nausea/vomiting   Outcome: Ongoing  Pt receiving scheduled antiemetics throughout shift.  Will continue to monitor.     Problem: Bleeding:  Goal: Will show no signs and symptoms of excessive bleeding  Will show no signs and symptoms of excessive bleeding   Outcome: Ongoing  Patient's hemoglobin this AM:   Recent Labs      09/09/16   0318   HGB  9.0*     Patient's platelet count this AM:   Recent Labs      09/09/16   0318   PLT  27*    Thrombocytopenia Precautions in place.   Patient showing no signs or symptoms of active bleeding.  Transfusion not indicated at this time.  Patient verbalizes understanding of all instructions. Will continue to assess and implement POC. Call light within reach and hourly rounding in place.     Problem: Venous Thromboembolism:  Goal: Will show no signs or symptoms of venous thromboembolism  Will show no signs or symptoms of venous thromboembolism   Outcome: Ongoing  Adherent with DVT Prevention: Pt is at risk for DVT d/t decreased mobility and cancer treatment.  Pt educated on importance of activity.  Pt has orders for SCDs while in bed.  Pt verbalizes understanding of need for prophylaxis while inpatient.

## 2016-09-09 NOTE — Plan of Care (Signed)
Problem: Nausea/Vomiting:  Goal: Absence of nausea/vomiting  Absence of nausea/vomiting   Outcome: Ongoing  Pt with ongoing nausea this shift.  Scheduled zofran provided per order, and pt reports satisfaction with relief.  Will continue to monitor.      Problem: Nutrition Deficit:  Goal: Ability to achieve adequate nutritional intake will improve  Ability to achieve adequate nutritional intake will improve   Outcome: Ongoing  Pt continues with decreased appetite this shift.  Will continue to encourage frequent, small meals.  Will continue to monitor.      Problem: Bleeding:  Goal: Will show no signs and symptoms of excessive bleeding  Will show no signs and symptoms of excessive bleeding   Outcome: Met This Shift  Patient's hemoglobin this AM:   Recent Labs      09/09/16   0318   HGB  9.0*     Patient's platelet count this AM:   Recent Labs      09/09/16   0318   PLT  27*    Thrombocytopenia Precautions in place.  Patient showing no signs or symptoms of active bleeding.  Transfusion not indicated at this time.  Patient verbalizes understanding of all instructions. Will continue to assess and implement POC. Call light within reach and hourly rounding in place.     Problem: Venous Thromboembolism:  Goal: Will show no signs or symptoms of venous thromboembolism  Will show no signs or symptoms of venous thromboembolism   Outcome: Met This Shift    Refusing DVT Prevention: Pt is at risk for DVT d/t decreased mobility and cancer treatment.  Pt educated on importance of activity. Pt has orders for SCDs while in bed, however pt currently refusing treatment.  Reviewed risks of DVT & PE development while inpatient.   Provider aware of patient's refusal and re-education of importance of prophylaxis.  No new orders at this time.  Will continue to re-instruct patient and intervene as appropriate.

## 2016-09-09 NOTE — Plan of Care (Signed)
Problem: Infection - Central Venous Catheter-Associated Bloodstream Infection:  Goal: Will show no infection signs and symptoms  Will show no infection signs and symptoms   Outcome: Met This Shift  Pt afebrile this shift.  CVC site and dressing are clean, dry, and intact with no redness or tenderness noted.  Lumens flush well.  Will continue to monitor.      Problem: PROTECTIVE PRECAUTIONS  Goal: Patient will remain free of nosocomial Infections  Pt in private room on low-microbial diet per unit protocol.  Pt, staff, and visitors demonstrate proper hand washing and use of PPE.  Will continue to monitor.      Problem: Pain:  Goal: Pain level will decrease  Pain level will decrease   Outcome: Met This Shift  Pt continues with chronic back pain: scheduled MS contin provided per order.  PRN morphine also provided, and pt reports satisfaction with relief.  Will continue to monitor.      Problem: Falls - Risk of:  Goal: Will remain free from falls  Will remain free from falls   Outcome: Met This Shift  VSS.  Bed in lowest position with wheels locked.  Pt up ad lib with steady gait and no slip footwear.  Pt uses call light appropriately.  Will continue to monitor.

## 2016-09-10 LAB — BASIC METABOLIC PANEL
Anion Gap: 11 (ref 3–16)
BUN: <2 mg/dL — ABNORMAL LOW (ref 7–20)
CO2: 23 mmol/L (ref 21–32)
Calcium: 7.6 mg/dL — ABNORMAL LOW (ref 8.3–10.6)
Chloride: 104 mmol/L (ref 99–110)
Creatinine: 0.6 mg/dL — ABNORMAL LOW (ref 0.9–1.3)
GFR African American: >60 (ref >60)
GFR Non-African American: >60 (ref >60)
Glucose: 115 mg/dL — ABNORMAL HIGH (ref 70–99)
Potassium: 3.5 mmol/L (ref 3.5–5.1)
Sodium: 138 mmol/L (ref 136–145)

## 2016-09-10 LAB — CBC WITH AUTO DIFFERENTIAL
Bands Relative: 2 % (ref 0–7)
Basophils %: 0 %
Basophils Absolute: 0 10*3/uL (ref 0.0–0.2)
Eosinophils %: 0 %
Eosinophils Absolute: 0 10*3/uL (ref 0.0–0.6)
Hematocrit: 23.9 % — ABNORMAL LOW (ref 40.5–52.5)
Hemoglobin: 8.2 g/dL — ABNORMAL LOW (ref 13.5–17.5)
Lymphocytes %: 24 %
Lymphocytes Absolute: 0.4 10*3/uL — ABNORMAL LOW (ref 1.0–5.1)
MCH: 29.2 pg (ref 26.0–34.0)
MCHC: 34.4 g/dL (ref 31.0–36.0)
MCV: 84.7 fL (ref 80.0–100.0)
MPV: 8.4 fL (ref 5.0–10.5)
Metamyelocytes Relative: 1 % — AB
Monocytes %: 20 %
Monocytes Absolute: 0.3 10*3/uL (ref 0.0–1.3)
Neutrophils %: 53 %
Neutrophils Absolute: 0.8 10*3/uL — CL (ref 1.7–7.7)
Platelets: 21 10*3/uL — ABNORMAL LOW (ref 135–450)
RBC: 2.82 M/uL — ABNORMAL LOW (ref 4.20–5.90)
RDW: 13.1 % (ref 12.4–15.4)
WBC: 1.5 10*3/uL — ABNORMAL LOW (ref 4.0–11.0)

## 2016-09-10 LAB — URIC ACID: Uric Acid, Serum: 2.4 mg/dL — ABNORMAL LOW (ref 3.5–7.2)

## 2016-09-10 LAB — PHOSPHORUS: Phosphorus: 1.5 mg/dL — ABNORMAL LOW (ref 2.5–4.9)

## 2016-09-10 LAB — MAGNESIUM: Magnesium: 1.5 mg/dL — ABNORMAL LOW (ref 1.80–2.40)

## 2016-09-10 LAB — CULTURE, VRE: VRE Culture: NEGATIVE

## 2016-09-10 LAB — APTT: aPTT: 28.5 s (ref 24.1–34.9)

## 2016-09-10 LAB — PROTIME-INR
INR: 1.28 — ABNORMAL HIGH (ref 0.85–1.15)
Protime: 14.5 s — ABNORMAL HIGH (ref 9.6–13.0)

## 2016-09-10 MED ORDER — ONDANSETRON HCL 8 MG PO TABS
8 MG | Freq: Three times a day (TID) | ORAL | Status: DC | PRN
Start: 2016-09-10 — End: 2016-09-16

## 2016-09-10 MED ORDER — SODIUM CHLORIDE 0.9 % IV BOLUS
0.9 % | Freq: Once | INTRAVENOUS | Status: AC
Start: 2016-09-10 — End: 2016-09-10
  Administered 2016-09-10: 16:00:00 1000 mL via INTRAVENOUS

## 2016-09-10 MED ORDER — POTASSIUM PHOSPHATE 3 MMOL/ML IV SOLN (MIXTURES ONLY)
155 mmol/5 mL | Freq: Once | INTRAVENOUS | Status: AC
Start: 2016-09-10 — End: 2016-09-10
  Administered 2016-09-10: 18:00:00 30 mmol via INTRAVENOUS

## 2016-09-10 MED FILL — SERTRALINE HCL 50 MG PO TABS: 50 MG | ORAL | Qty: 1

## 2016-09-10 MED FILL — KCL IN DEXTROSE-NACL 30-5-0.45 MEQ/L-%-% IV SOLN: INTRAVENOUS | Qty: 1000

## 2016-09-10 MED FILL — GRANIX 300 MCG/0.5ML SC SOSY: 300 MCG/0.5ML | SUBCUTANEOUS | Qty: 0.5

## 2016-09-10 MED FILL — PANTOPRAZOLE SODIUM 40 MG PO TBEC: 40 MG | ORAL | Qty: 1

## 2016-09-10 MED FILL — POTASSIUM CHLORIDE CRYS ER 20 MEQ PO TBCR: 20 MEQ | ORAL | Qty: 2

## 2016-09-10 MED FILL — DIPHENOXYLATE-ATROPINE 2.5-0.025 MG PO TABS: ORAL | Qty: 1

## 2016-09-10 MED FILL — ONDANSETRON HCL 4 MG/2ML IJ SOLN: 4 MG/2ML | INTRAMUSCULAR | Qty: 2

## 2016-09-10 MED FILL — BACLOFEN 10 MG PO TABS: 10 MG | ORAL | Qty: 2

## 2016-09-10 MED FILL — LOPERAMIDE HCL 2 MG PO CAPS: 2 MG | ORAL | Qty: 2

## 2016-09-10 MED FILL — LEVAQUIN 500 MG PO TABS: 500 MG | ORAL | Qty: 1

## 2016-09-10 MED FILL — POTASSIUM PHOSPHATES 150 MMOL/50 ML IV SOLN: 150 mmol/50 mL | INTRAVENOUS | Qty: 10

## 2016-09-10 MED FILL — MORPHINE SULFATE 15 MG PO TABS: 15 mg | ORAL | Qty: 1

## 2016-09-10 MED FILL — FLUCONAZOLE 200 MG PO TABS: 200 MG | ORAL | Qty: 2

## 2016-09-10 MED FILL — VALACYCLOVIR HCL 500 MG PO TABS: 500 MG | ORAL | Qty: 1

## 2016-09-10 MED FILL — ORAMORPH SR 30 MG PO TBCR: 30 mg | ORAL | Qty: 1

## 2016-09-10 MED FILL — SODIUM CHLORIDE 0.9 % IV SOLN: 0.9 % | INTRAVENOUS | Qty: 1000

## 2016-09-10 MED FILL — LORAZEPAM 1 MG PO TABS: 1 MG | ORAL | Qty: 1

## 2016-09-10 MED FILL — CETIRIZINE HCL 10 MG PO TABS: 10 MG | ORAL | Qty: 1

## 2016-09-10 NOTE — Plan of Care (Signed)
Problem: Infection - Central Venous Catheter-Associated Bloodstream Infection:  Goal: Will show no infection signs and symptoms  Will show no infection signs and symptoms   Outcome: Ongoing  Has a left chest trifusion, line care provided.  Dressing intact.  Flushes easily with brisk blood return.    Problem: PROTECTIVE PRECAUTIONS  Goal: Patient will remain free of nosocomial Infections  Outcome: Ongoing  WBC 1.5 with ANC 0.8, remains in protective precautions.  Afebrile VSS.  Continues to have loose watery stools.  Denies any abd pain or discomfort.  Has had 515mL plus 3 unsaved specimens.  Continues on scheduled imodium and lomitol    Problem: Pain:  Goal: Pain level will decrease  Pain level will decrease   Outcome: Ongoing  Has chronic back pain, on scheduled MSContin.  Complained of increasing back pain, rated 8 out of 10.  Unable to sleep.  Medicated with Morphine IR 15mg  PO with minimal relief.  Up sitting in chair, Heat applied to lower back.  Resting comfortably    Problem: Falls - Risk of:  Goal: Will remain free from falls  Will remain free from falls   Outcome: Ongoing  Orthostatic vital signs obtained at start of shift - see flowsheet for details.  Pt meets criteria for orthostasis.  Pt is a Med fall risk. See Leamon Arnt Fall Score and ABCDS Injury Risk assessments.   Pt bed is in low position, side rails up, call light and belongings are in reach.  Fall risk light is on outside pts room.  Pt encouraged to call for assistance as needed. Will continue with hourly rounds for PO intake, pain needs, toileting and repositioning as needed.        Problem: Nausea/Vomiting:  Goal: Absence of nausea/vomiting  Absence of nausea/vomiting   Outcome: Ongoing  Nausea improving, able to eat bites of chicken last night.  Planning on attempting breakfast.  Continues on scheduled Zofran    Problem: Nutrition Deficit:  Goal: Ability to achieve adequate nutritional intake will improve  Ability to achieve adequate nutritional  intake will improve   Outcome: Ongoing      Problem: Bleeding:  Goal: Will show no signs and symptoms of excessive bleeding  Will show no signs and symptoms of excessive bleeding   Outcome: Ongoing  Patient's hemoglobin this AM:   Recent Labs      09/10/16   0327   HGB  8.2*     Patient's platelet count this AM:   Recent Labs      09/10/16   0327   PLT  21*    Thrombocytopenia Precautions in place.  Patient showing no signs or symptoms of active bleeding.  Transfusion not indicated at this time.  Patient verbalizes understanding of all instructions. Will continue to assess and implement POC. Call light within reach and hourly rounding in place.      Problem: Venous Thromboembolism:  Goal: Will show no signs or symptoms of venous thromboembolism  Will show no signs or symptoms of venous thromboembolism   Outcome: Ongoing   Pt is at risk for DVT d/t decreased mobility and cancer treatment.  Pt educated on importance of activity. Pt has orders for SCDs while in bed, however pt currently refusing treatment.  Reviewed risks of DVT & PE development while inpatient.   Provider aware of patient's refusal and re-education of importance of prophylaxis.  No new orders at this time.  Will continue to re-instruct patient and intervene as appropriate.

## 2016-09-10 NOTE — Plan of Care (Signed)
Problem: Infection - Central Venous Catheter-Associated Bloodstream Infection:  Goal: Will show no infection signs and symptoms  Will show no infection signs and symptoms   Outcome: Ongoing  CVC site remains free of signs/symptoms of infection. No drainage, edema, erythema, pain, or warmth noted at site. Dressing changes continue per protocol and on an as needed basis - see flowsheet. Patient will shower this evening; verbalizes understanding of BCC bath protocol and line care with CHG wipes.  Pt tolerated well.  Continued to encourage daily CHG bathing per [facility] protocol.    .      Problem: PROTECTIVE PRECAUTIONS  Goal: Patient will remain free of nosocomial Infections  Outcome: Ongoing  Pt remains in protective precautions.  Pt educated on wearing mask when in hallways. Pt, staff, and visitors adhering to handwashing guidelines. Pt educated to shower or bathe daily with chlorhexidine and linens changed daily per protocol. Pt verbalizes understanding of low microbial diet. Will continue to monitor.     Problem: Pain:  Goal: Control of chronic pain  Control of chronic pain   Outcome: Ongoing  Patient c/o chronic lower back pain this shift. On scheduled MS contin and PRN oral morphine; see MAR. Patient satisfied with medication administration. Patient encouraged to call with pain needs. Heat pack also applied to low back this afternoon. Will continue to monitor and reassess pain level.     Problem: Falls - Risk of:  Goal: Will remain free from falls  Will remain free from falls   Outcome: Ongoing  Patient remains free from falls this shift. Chair in locked position. Patient is up ad lib. Personal non-skid foot wear when up walking. Fall precautions in place. Encouraged to call with needs, call light within reach. Will continue to monitor and reassess throughout shift.     Problem: Nausea/Vomiting:  Goal: Absence of nausea/vomiting  Absence of nausea/vomiting   Outcome: Ongoing  Patient denies N/V this shift. Encouraged  to call with nausea needs. Zofran changed to PRN per MD.     Problem: Nutrition Deficit:  Goal: Ability to achieve adequate nutritional intake will improve  Ability to achieve adequate nutritional intake will improve   Outcome: Ongoing  Patient with decreased appetite this shift. Ordered some toast for lunch. Encourage supplements and small, frequent meals. Will monitor.     Problem: Bleeding:  Goal: Will show no signs and symptoms of excessive bleeding  Will show no signs and symptoms of excessive bleeding   Outcome: Ongoing  Patient's hemoglobin this AM:   Recent Labs      09/10/16   0327   HGB  8.2*     Patient's platelet count this AM:   Recent Labs      09/10/16   0327   PLT  21*    Thrombocytopenia Precautions in place.  Patient showing no signs or symptoms of active bleeding.  Transfusion not indicated at this time.  Patient verbalizes understanding of all instructions. Will continue to assess and implement POC. Call light within reach and hourly rounding in place.     Problem: Venous Thromboembolism:  Goal: Will show no signs or symptoms of venous thromboembolism  Will show no signs or symptoms of venous thromboembolism   Outcome: Ongoing  Pt is at risk for DVT d/t decreased mobility and cancer treatment.  Pt educated on importance of activity.  Pt has orders for SCDs while in bed.  Patient up in chair this shift and ambulates in room frequently; no SCDs while in chair.  Pt verbalizes understanding of need for prophylaxis while inpatient.

## 2016-09-10 NOTE — Progress Notes (Signed)
BCC  Autologous Progress Note    09/10/2016    Sherrilee Gilles    DOB:  Oct 23, 1955    MRN:  [id number]    Referring MD: Leola Brazil, MD  [address]  [place], [place] [address]    Subjective:  Steadily improving - diarrhea continues; nausea resolved     ECOG PS:  2    Isolation:  None     Medications    Scheduled Meds:  . sodium chloride  1,000 mL Intravenous Once   . potassium phosphate IVPB  30 mmol Intravenous Once   . potassium chloride  40 mEq Oral Daily with breakfast   . loperamide  4 mg Oral 4 times per day   . diphenoxylate-atropine  1 tablet Oral 4 times per day   . morphine  30 mg Oral BID   . baclofen  20 mg Oral TID   . sertraline  50 mg Oral Daily   . Tbo-Filgrastim  300 mcg Subcutaneous QPM   . Saline Mouthwash  15 mL Swish & Spit 4x Daily AC & HS   . sodium chloride flush  10 mL Intravenous 2 times per day   . valACYclovir  500 mg Oral BID   . umeclidinium-vilanterol  1 puff Inhalation Daily   . cetirizine  10 mg Oral Daily   . pantoprazole  40 mg Oral QAM AC     Continuous Infusions:  . dextrose 5% and 0.45% NaCl with KCl 30 mEq 150 mL/hr at 09/10/16 0528   . sodium chloride     . sodium chloride       PRN Meds:.morphine, sodium chloride, sodium chloride, alteplase, magnesium hydroxide, magnesium sulfate, potassium chloride, Saline Mouthwash, prochlorperazine **OR** prochlorperazine, LORazepam **OR** LORazepam, albuterol sulfate HFA, polyethylene glycol, LORazepam    ROS:   Constitutional: Denies fever, sweats, weight loss.   Eyes: No visual changes or diplopia. No scleral icterus.   ENT: No Headaches, hearing loss or vertigo. No mouth sores or sore throat.   Cardiovascular: No chest pain, dyspnea on exertion, palpitations or loss of consciousness.    Respiratory: No cough or wheezing, no sputum production. No hemoptysis.   Gastrointestinal: No abdominal pain, no blood in stools. +Diarrhea.  +Appetite loss   Genitourinary: No dysuria, trouble voiding, or  hematuria.   Musculoskeletal:  +generalized weakness & back pain. No joint complaints.   Integumentary: No rash or pruritis.   Neurological: No headache, diplopia. No change in gait, balance, or coordination. No paresthesias.   Endocrine: No temperature intolerance. No excessive thirst, fluid intake, or urination.    Hematologic/Lymphatic: No abnormal bruising or ecchymoses, blood clots or swollen lymph nodes.   Allergic/Immunologic: No nasal congestion or hives.       Physical Exam:     I&O:      Intake/Output Summary (Last 24 hours) at 09/10/16 1044  Last data filed at 09/10/16 0853   Gross per 24 hour   Intake             4892 ml   Output             2375 ml   Net             2517 ml       Vital Signs:  BP 98/60   Pulse 83   Temp 98.5 F (36.9 C) (Oral)   Resp 16   Ht 5' 8.74" (1.746 m)   Wt 189 lb 1.6 oz (85.8 kg)  SpO2 95%   BMI 28.14 kg/m     Weight:    Wt Readings from Last 3 Encounters:   09/10/16 189 lb 1.6 oz (85.8 kg)   08/17/16 192 lb (87.1 kg)   08/10/16 202 lb (91.6 kg)       General: Awake, alert and oriented   HEENT: normocephalic, alopecia, PERRL, no scleral erythema or icterus, Oral mucosa moist and intact, throat clear.   NECK: supple without palpable adenopathy  BACK: Straight, negative CVAT  SKIN: warm dry and intact without lesions rashes or masses  CHEST: CTA bilaterally without use of accessory muscles  CV: Normal S1 S2, RRR, no MRG  ABD: NT ND normoactive BS, no palpable masses or hepatosplenomegaly  EXTREMITIES: without edema, denies calf tenderness  NEURO: CN II - XII grossly intact  CATHETER: LSC Trifusion ([place], 08/17/16) - CDI, no erythema      Data:   CBC:   Recent Labs      09/08/16   0400  09/09/16   0318  09/10/16   0327   WBC  0.4*  1.0*  1.5*   HGB  8.7*  9.0*  8.2*   HCT  25.2*  25.9*  23.9*   MCV  84.2  84.1  84.7   PLT  8*  27*  21*     BMP/Mag:  Recent Labs      09/08/16   0400  09/09/16   0318  09/10/16   0327   NA  135*  137  138   K  3.6  3.2*  3.5   CL   102  102  104   CO2  24  24  23    PHOS   --   2.3*  1.5*   BUN  5*  3*  <2*   CREATININE  0.6*  0.6*  0.6*   MG   --    --   1.50*     LIVP:   Recent Labs      09/09/16   0318   AST  9*   ALT  8*   BILIDIR  <0.2   BILITOT  0.4   ALKPHOS  68     Uric Acid:    Recent Labs      09/10/16   0327   LABURIC  2.4*     Coags:   Recent Labs      09/10/16   0327   PROTIME  14.5*   INR  1.28*   APTT  28.5     DIAGNOSTICS:   1. CT Thoracic & Lumbar Spine 08/31/16  Thoracic: Diffuse lytic skeletal metastatic disease compatible with  patient's clinical history of multiple myeloma, most pronounced at  the T6 and T10 vertebrae as detailed in body report. Potential  intraspinal involvement by metastatic disease at T6 and T10  levels, but no evidence for significant thecal sac compression on  noncontrast CT.  No fracture or thoracic spine infection.  Small right pleural effusion  Lumbar: Diffuse lytic skeletal metastatic disease compatible with  patient's clinical history of multiple myeloma. No fracture.  No fracture or lumbar spine infection  L5-S1 severe disc degeneration and grade I isthmic  spondylolisthesis contributing to severe bilateral L5 foraminal  stenosis.    PROBLEM LIST:      Multiple myeloma  Hyperlipidemia  HTN  Neoplasm related pain    Post-Transplant Complications:  1. Anorexia  2. Grade 3 Diarrhea    TREATMENT:  1. Rad Tx to T5-7, T10-L3, Right Scapula - 3000 cGy - Dr. Bonita Quin 03/20/16-04/01/16  2. RVD x1 03/20/16 - discontinued d/t rash   3. Velcade/Pomalyst/Dex x3 cycles 04/23/16-06/17/16 - discontinued d/t reaction to pomalyst  4. Velcade/Dex x 1 cycle 06/26/16 (last dose of dexamethasone 07/22/16  5. High-dose melphalan followed by administration of PBSCs 2.36 x10^6 cd34cells/kg on 08/28/16    ASSESSMENT AND PLAN:      1.  IgG kappa multiple myeloma: PR after 5 cycles chemotherapy - M-spike is 0.4 & BM biopsy 2 % plasma cells  - Admitted for melphalan 200mg /m2 preparative regimen followed by  administration of PBSCs 2.36 x10^6 cd34cells/kg on 08/28/16. He is NOT an early release candidate d/t his lack of social support and high anxiety    Day + 13    2.  ID:  Afebrile, no evidence of infection.    - Acyclovir at discharge for VZV positive titer.    - Valtrex, Diflucan & Levaquin ppx    3.  Heme:  Pancytopenia from chemotherapy, ANC Engrafting  - Transfuse for Hgb < 7 and Platelets < 10K  - No transfusion today  - Started Granix 09/02/16    4.  Metabolic: HypoK/HypoCa/HypoPhos, other electrolytes are WNL and renal fxn stable. Wt down from admission but stable the past week. Decreased uop (per RN report, pt continues to urinate in toilet)  - Increase KCl in IVF hydration: D51/2NS w/30KCl @ 155mL/hr, likely hypokalemia from fluid losses r/t diarrhea  - Replace potassium and magnesium per orders  - Strict I/O   - IV phos today; oral potassium    5. GI/Nutrition:  Mild Malnutrition w/Poor appetite & intake, but attempting to increase. Ongoing diarrhea  - Cont low microbial diet  - Follow closely with dietary  - Encourage oral supplements - multiple discussions with pt about attempting oral intake have been had daily  - GERD: Cont PPI  - Diarrhea: CDiff neg 09/02/16.  Immodium and Lomotil  - change anti-nausea meds to PRN    6. Pain: Chronic back & rib pain s/p radtx; acute flare of back pain  - MS contin 30 mg BID; MSIR 15 mg q4h; baclofen 20 tid  - CT 08/31/16: no new pathology    7. 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 Chronic Bronchitis/COPD: As evidenced by pre-transplant PFTs - FEV1 is 43%, He did respond to bronchodilators and went up to 52%  - Cont albuterol PRN  - Cont anoro ellipta inhaler  - Cleared for transplant by Dr. Eulah Pont    9. Cardiac: h/o HLD. 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 - Cleared  for BMT by Dr. Philomena Course  - HTN: Lisinopril 10mg  daily, hold today due to low BP's    10. Anxiety/Depression  - Ativan PRN  - Dr. Loura Pardon consulted  - Started Zoloft qhs this admit    11. Insomnia:  - Ativan PRN qHS    12. Debilitation: d/t malnutrition & back pain  - PT/OT signed off.  Pt doing well.      - DVT Prophylaxis: Platelets <50,000 cells/dL - prophylactic lovenox on hold and mechanical prophylaxis with bilateral SCDs while in bed in place.  Contraindications to pharmacologic prophylaxis: Thrombocytopenia  Contraindications to mechanical prophylaxis: None    - Disposition:  Will be discharged after transplant when blood counts have recovered and he is afebrile and eating and drinking well.         Crissie Figures, CNP        Bruk [place]olo A. Nanci Pina, DO, MS

## 2016-09-11 LAB — CBC WITH AUTO DIFFERENTIAL
Bands Relative: 20 % — ABNORMAL HIGH (ref 0–7)
Basophils %: 0 %
Basophils Absolute: 0 10*3/uL (ref 0.0–0.2)
Eosinophils %: 0 %
Eosinophils Absolute: 0 10*3/uL (ref 0.0–0.6)
Hematocrit: 25.2 % — ABNORMAL LOW (ref 40.5–52.5)
Hemoglobin: 8.5 g/dL — ABNORMAL LOW (ref 13.5–17.5)
Lymphocytes %: 33 %
Lymphocytes Absolute: 0.9 10*3/uL — ABNORMAL LOW (ref 1.0–5.1)
MCH: 28.7 pg (ref 26.0–34.0)
MCHC: 33.7 g/dL (ref 31.0–36.0)
MCV: 85.1 fL (ref 80.0–100.0)
MPV: 8.6 fL (ref 5.0–10.5)
Monocytes %: 0 %
Monocytes Absolute: 0 10*3/uL (ref 0.0–1.3)
Neutrophils %: 47 %
Neutrophils Absolute: 1.9 10*3/uL (ref 1.7–7.7)
Platelets: 27 10*3/uL — ABNORMAL LOW (ref 135–450)
RBC: 2.96 M/uL — ABNORMAL LOW (ref 4.20–5.90)
RDW: 13.5 % (ref 12.4–15.4)
WBC: 2.8 10*3/uL — ABNORMAL LOW (ref 4.0–11.0)

## 2016-09-11 LAB — BASIC METABOLIC PANEL
Anion Gap: 12 (ref 3–16)
BUN: <2 mg/dL — ABNORMAL LOW (ref 7–20)
CO2: 22 mmol/L (ref 21–32)
Calcium: 7.5 mg/dL — ABNORMAL LOW (ref 8.3–10.6)
Chloride: 102 mmol/L (ref 99–110)
Creatinine: 0.6 mg/dL — ABNORMAL LOW (ref 0.9–1.3)
GFR African American: >60 (ref >60)
GFR Non-African American: >60 (ref >60)
Glucose: 108 mg/dL — ABNORMAL HIGH (ref 70–99)
Potassium: 3.8 mmol/L (ref 3.5–5.1)
Sodium: 136 mmol/L (ref 136–145)

## 2016-09-11 LAB — HEPATIC FUNCTION PANEL
ALT: 9 U/L — ABNORMAL LOW (ref 10–40)
AST: 11 U/L — ABNORMAL LOW (ref 15–37)
Albumin: 2.9 g/dL — ABNORMAL LOW (ref 3.4–5.0)
Alkaline Phosphatase: 89 U/L (ref 40–129)
Bilirubin, Direct: <0.2 mg/dL (ref 0.0–0.3)
Total Bilirubin: 0.3 mg/dL (ref 0.0–1.0)
Total Protein: 5.3 g/dL — ABNORMAL LOW (ref 6.4–8.2)

## 2016-09-11 LAB — URIC ACID: Uric Acid, Serum: 2.5 mg/dL — ABNORMAL LOW (ref 3.5–7.2)

## 2016-09-11 LAB — PHOSPHORUS: Phosphorus: 2.2 mg/dL — ABNORMAL LOW (ref 2.5–4.9)

## 2016-09-11 LAB — LACTATE DEHYDROGENASE: LD: 144 U/L (ref 100–190)

## 2016-09-11 MED ORDER — POTASSIUM CHLORIDE CRYS ER 20 MEQ PO TBCR
20 MEQ | Freq: Two times a day (BID) | ORAL | Status: DC
Start: 2016-09-11 — End: 2016-09-14
  Administered 2016-09-11 – 2016-09-13 (×5): 20 meq via ORAL

## 2016-09-11 MED FILL — DIPHENOXYLATE-ATROPINE 2.5-0.025 MG PO TABS: ORAL | Qty: 1

## 2016-09-11 MED FILL — POTASSIUM CHLORIDE CRYS ER 20 MEQ PO TBCR: 20 MEQ | ORAL | Qty: 2

## 2016-09-11 MED FILL — BACLOFEN 10 MG PO TABS: 10 MG | ORAL | Qty: 2

## 2016-09-11 MED FILL — SERTRALINE HCL 50 MG PO TABS: 50 MG | ORAL | Qty: 1

## 2016-09-11 MED FILL — POTASSIUM CHLORIDE CRYS ER 20 MEQ PO TBCR: 20 MEQ | ORAL | Qty: 1

## 2016-09-11 MED FILL — CETIRIZINE HCL 10 MG PO TABS: 10 MG | ORAL | Qty: 1

## 2016-09-11 MED FILL — MORPHINE SULFATE 15 MG PO TABS: 15 mg | ORAL | Qty: 1

## 2016-09-11 MED FILL — VALACYCLOVIR HCL 500 MG PO TABS: 500 MG | ORAL | Qty: 1

## 2016-09-11 MED FILL — LOPERAMIDE HCL 2 MG PO CAPS: 2 MG | ORAL | Qty: 2

## 2016-09-11 MED FILL — GRANIX 300 MCG/0.5ML SC SOSY: 300 MCG/0.5ML | SUBCUTANEOUS | Qty: 0.5

## 2016-09-11 MED FILL — LORAZEPAM 1 MG PO TABS: 1 MG | ORAL | Qty: 1

## 2016-09-11 MED FILL — ORAMORPH SR 30 MG PO TBCR: 30 mg | ORAL | Qty: 1

## 2016-09-11 MED FILL — KCL IN DEXTROSE-NACL 30-5-0.45 MEQ/L-%-% IV SOLN: INTRAVENOUS | Qty: 1000

## 2016-09-11 MED FILL — PANTOPRAZOLE SODIUM 40 MG PO TBEC: 40 MG | ORAL | Qty: 1

## 2016-09-11 NOTE — Progress Notes (Signed)
Nutrition Assessment    Type and Reason for Visit: Reassess    Nutrition Recommendations:   1. Diet: monitor and encourage oral intake, 4-6 small frequent meals daily, caloric beverages throughout the day, foods w/meds, lower fat/fiber and avoid whole milk products except encourage yogurt daily  2. ONS: continue Ensure supplements between meals  3. Recommend consider placement of [place] and TF initiated until po intake can meet at least 50% of estimated needs. If TF to start please consult RD for recommendations. When started recommend Reglan w/TF to help improve tolerance and avoid risk of increased nausea.   4. Will continue to monitor and encourage po intake    Malnutrition Assessment:   Malnutrition Status: Meets the criteria for moderate malnutrition   Context: Acute illness or injury   Findings of the 6 clinical characteristics of malnutrition (Minimum of 2 out of 6 clinical characteristics is required to make the diagnosis of moderate or severe Protein Calorie Malnutrition based on AND/ASPEN Guidelines):  1. Energy Intake-Less than or equal to 50%, greater than 7 days    2. Weight Loss-5% loss or greater, in 1 month  3. Fat Loss-No significant subcutaneous fat loss,    4. Muscle Loss-Mild muscle mass loss, Thigh (quadriceps)  5. Fluid Accumulation-No significant fluid accumulation,    6. Grip Strength-Not measured    Nutrition Diagnosis:    Problem: Inadequate oral intake   Etiology: related to Catabolic illness    . Signs and symptoms:  as evidenced by Intake 0-25%, Weight loss    Nutrition Assessment:   Subjective Assessment: Pt continues to struggle w/po intake, able to take oral meds, consuming x1 Ensure daily tolerating Strawberry flavored. no edema or skin issues noted, current wt 190lb 1.6 oz, output last 24 hrs, +Imodium/Lomotil, stated nausea improved today but guarded intake d/t pt with concern now for worsening diarrhea or Nausea.    Nutrition-Focused Physical Findings:  output last 24 hrs, mild nausea, low appetite   Wound Type: None   Current Nutrition Therapies:   Oral Diet Orders: Low Microbial    Oral Diet intake: 1-25%, Select   Oral Nutrition Supplement (ONS) Orders: Standard High Calorie Oral Supplement   ONS intake: 76-100%   Anthropometric Measures:   Ht: 5' 8.74" (174.6 cm)    Current Body Wt: 190 lb 0.6 oz (86.2 kg)   Admission Body Wt: 201 lb (91.2 kg)   Usual Body Wt: 198 lb (89.8 kg)   % Weight Change: 5%,  16 days   Ideal Body Wt: 154 lb (69.9 kg),    BMI Classification: BMI 25.0 - 29.9 Overweight   Comparative Standards (Estimated Nutrition Needs):   Estimated Daily Total Kcal: 2553-2979 kcal    Estimated Daily Protein (g): 127-170 grams    Estimated Intake vs Estimated Needs: Intake Less Than Needs    Nutrition Risk Level: High    Nutrition Interventions:   Continue current diet, Continue current ONS  Continued Inpatient Monitoring, Coordination of Care, Education Completed    Nutrition Evaluation:    Evaluation: No progress toward goals    Goals: pt will tolerate and consume 50% or more of all meals and supplements offered      Monitoring: Meal Intake, Supplement Intake, Diet Tolerance, Weight, Nausea or Vomiting, Diarrhea    See Adult Nutrition Doc Flowsheet for more detail.     Electronically signed by Brynda Peon, RD, LD on 09/11/16 at 10:29 AM    Contact Number: [phone]

## 2016-09-11 NOTE — Progress Notes (Signed)
[facility]  Autologous Progress Note    09/11/2016    David Watkins    DOB:  09-17-1955    MRN:  [id number]    Referring MD: Eulis Canner, MD  [address] [place] [address]    Subjective:  In chair - still not eating very well; no new complaints, although stool output remains > 1L     ECOG PS:  2    Isolation:  None     Medications    Scheduled Meds:  ??? potassium chloride  40 mEq Oral Daily with breakfast   ??? loperamide  4 mg Oral 4 times per day   ??? diphenoxylate-atropine  1 tablet Oral 4 times per day   ??? morphine  30 mg Oral BID   ??? baclofen  20 mg Oral TID   ??? sertraline  50 mg Oral Daily   ??? Tbo-Filgrastim  300 mcg Subcutaneous QPM   ??? Saline Mouthwash  15 mL Swish & Spit 4x Daily AC & HS   ??? sodium chloride flush  10 mL Intravenous 2 times per day   ??? valACYclovir  500 mg Oral BID   ??? umeclidinium-vilanterol  1 puff Inhalation Daily   ??? cetirizine  10 mg Oral Daily   ??? pantoprazole  40 mg Oral QAM AC     Continuous Infusions:  ??? dextrose 5% and 0.45% NaCl with KCl 30 mEq 150 mL/hr at 09/10/16 2121   ??? sodium chloride     ??? sodium chloride       PRN Meds:.ondansetron, morphine, sodium chloride, sodium chloride, alteplase, magnesium hydroxide, magnesium sulfate, potassium chloride, Saline Mouthwash, prochlorperazine **OR** prochlorperazine, LORazepam **OR** LORazepam, albuterol sulfate HFA, polyethylene glycol, LORazepam    ROS:  ?? Constitutional: Denies fever, sweats, weight loss.  ?? Eyes: No visual changes or diplopia. No scleral icterus.  ?? ENT: No Headaches, hearing loss or vertigo. No mouth sores or sore throat.  ?? Cardiovascular: No chest pain, dyspnea on exertion, palpitations or loss of consciousness.   ?? Respiratory: No cough or wheezing, no sputum production. No hemoptysis.  ?? Gastrointestinal: No abdominal pain, no blood in stools. +Diarrhea.  +Appetite loss  ?? Genitourinary: No dysuria, trouble voiding, or hematuria.  ?? Musculoskeletal:  +generalized weakness & back pain. No joint  complaints.  ?? Integumentary: No rash or pruritis.  ?? Neurological: No headache, diplopia. No change in gait, balance, or coordination. No paresthesias.  ?? Endocrine: No temperature intolerance. No excessive thirst, fluid intake, or urination.   ?? Hematologic/Lymphatic: No abnormal bruising or ecchymoses, blood clots or swollen lymph nodes.  ?? Allergic/Immunologic: No nasal congestion or hives.       Physical Exam:     I&O:      Intake/Output Summary (Last 24 hours) at 09/11/16 0759  Last data filed at 09/11/16 0605   Gross per 24 hour   Intake             5464 ml   Output             1850 ml   Net             3614 ml       Vital Signs:  BP 106/64    Pulse 95    Temp 99.1 ??F (37.3 ??C) (Oral)    Resp 18    Ht 5' 8.74" (1.746 m)    Wt 189 lb 1.6 oz (85.8 kg)    SpO2 92%  BMI 28.14 kg/m??     Weight:    Wt Readings from Last 3 Encounters:   09/10/16 189 lb 1.6 oz (85.8 kg)   08/17/16 192 lb (87.1 kg)   08/10/16 202 lb (91.6 kg)       General: Awake, alert and oriented ??  HEENT: normocephalic, alopecia, PERRL, no scleral erythema or icterus, Oral mucosa moist and intact, throat clear.   NECK: supple without palpable adenopathy  BACK: Straight, negative CVAT  SKIN: warm dry and intact without lesions rashes or masses  CHEST: CTA bilaterally without use of accessory muscles  CV: Normal S1 S2, RRR, no MRG  ABD: NT ND normoactive BS, no palpable masses or hepatosplenomegaly  EXTREMITIES: without edema, denies calf tenderness  NEURO: CN II - XII grossly intact  CATHETER: LSC Trifusion ([place]at, 08/17/16) - CDI, no erythema      Data:   CBC:   Recent Labs      09/09/16   0318  09/10/16   0327  09/11/16   0335   WBC  1.0*  1.5*  2.8*   HGB  9.0*  8.2*  8.5*   HCT  25.9*  23.9*  25.2*   MCV  84.1  84.7  85.1   PLT  27*  21*  27*     BMP/Mag:  Recent Labs      09/09/16   0318  09/10/16   0327  09/11/16   0335   NA  137  138  136   K  3.2*  3.5  3.8   CL  102  104  102   CO2  _0 PHOS  2.3*  1.5*  2.2*   BUN  3*  <2*   <2*   CREATININE  0.6*  0.6*  0.6*   MG   --   1.50*   --      LIVP:   Recent Labs      09/09/16   0318  09/11/16   0335   AST  9*  11*   ALT  8*  9*   BILIDIR  <0.2  <0.2   BILITOT  0.4  0.3   ALKPHOS  68  89     Uric Acid:    Recent Labs      09/11/16   0335   LABURIC  2.5*     Coags:   Recent Labs      09/10/16   0327   PROTIME  14.5*   INR  1.28*   APTT  28.5     DIAGNOSTICS:   1. CT Thoracic & Lumbar Spine 08/31/16  Thoracic: Diffuse lytic skeletal metastatic disease compatible with  patient's clinical history of multiple myeloma, most pronounced at  the T6 and T10 vertebrae as detailed in body report. Potential  intraspinal involvement by metastatic disease at T6 and T10  levels, but no evidence for significant thecal sac compression on  noncontrast CT.  No fracture or thoracic spine infection.  Small right pleural effusion  Lumbar: Diffuse lytic skeletal metastatic disease compatible with  patient's clinical history of multiple myeloma. No fracture.  No fracture or lumbar spine infection  L5-S1 severe disc degeneration and grade I isthmic  spondylolisthesis contributing to severe bilateral L5 foraminal  stenosis.    PROBLEM LIST: ????????   ????  Multiple myeloma  Hyperlipidemia  HTN  Neoplasm related pain    Post-Transplant Complications:  1. Anorexia  2. Grade 3 Diarrhea  ????  TREATMENT: ????????   ????  1. Rad Tx to T5-7, T10-L3, Right Scapula - 3000 cGy - Dr. Jamas Lav 03/20/16-04/01/16  2. RVD x1 03/20/16 - discontinued d/t rash   3. Velcade/Pomalyst/Dex x3 cycles 04/23/16-06/17/16 - discontinued d/t reaction to pomalyst  4. Velcade/Dex x 1 cycle 06/26/16 (last dose of dexamethasone 07/22/16  5. High-dose melphalan followed by administration of PBSCs 2.36 x10^6 cd34cells/kg on 08/28/16  ??  ASSESSMENT AND PLAN: ????????   ????  1.  IgG kappa multiple myeloma: PR after 5 cycles chemotherapy - M-spike is 0.4 & BM biopsy 2 % plasma cells  - Admitted for melphalan 29m/m2 preparative regimen followed by administration of PBSCs 2.36  x10^6 cd34cells/kg on 08/28/16. He is NOT an early release candidate d/t his lack of social support and high anxiety    Day + 14  ??  2.  ID:  Afebrile, no evidence of infection.    - Cont Valtrex ppx, Acyclovir at discharge for VZV positive titer.    - Diflucan & Levaquin ppx stopped 09/10/16  ??  3.  Heme:  Pancytopenia from chemotherapy, ANC & plts engrafting  - Transfuse for Hgb < 7 and Platelets < 10K  - No transfusion today  - Granix 09/02/16, last dose today (09/11/16)  ??  4.  Metabolic: HypoCa/HypoPhos, other electrolytes are WNL and renal fxn stable.   - Cont IVF hydration: D51/2NS w/30KCl @ 1023mhr, likely hypokalemia from fluid losses r/t diarrhea  - Replace potassium and magnesium per orders  - Strict I/O   - Oral Klor-Con 2071mbid   ??  5. GI/Nutrition:  Mild Malnutrition w/Poor appetite & intake, but attempting to increase. Ongoing diarrhea  - Cont low microbial diet  - Oral intake behind during stay, evident by BUN < 2; needs strong encouragement to eat  - Encourage oral supplements - multiple discussions with pt about attempting oral intake have been had daily  - GERD: Cont PPI  - Diarrhea: CDiff neg 09/02/16.  Immodium and Lomotil scheduled  ??  6. Pain: Chronic back & rib pain s/p radtx; acute flare of back pain  - MS contin 30 mg BID; MSIR 15 mg q4h; baclofen 20 tid  - CT 08/31/16: no new pathology  ??  7. 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  8. Chronic Bronchitis/COPD: As evidenced by pre-transplant PFTs - FEV1 is 43%, He did respond to bronchodilators and went up to 52%  - Cont albuterol PRN  - Cont anoro ellipta inhaler  - Cleared for transplant by Dr. MurPercell Miller?  9. Cardiac: h/o HLD. 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 - Cleared for BMT by Dr. WhaEliane Decree HTN: Lisinopril 51m85mily, hold today due to low  BP's  ??  10. Anxiety/Depression  - Ativan PRN  - Dr. SontJavier Dockersulted  - Started Zoloft qhs this admit  ??  11. Insomnia:  - Ativan PRN qHS    12. Debilitation: d/t malnutrition & back pain  - PT/OT signed off.  Pt doing well.      - DVT Prophylaxis: Platelets <50,000 cells/dL - prophylactic lovenox on hold and mechanical prophylaxis with bilateral SCDs while in bed in place.  Contraindications to pharmacologic prophylaxis: Thrombocytopenia  Contraindications to mechanical prophylaxis: None    - Disposition:  No  transportation today, so set up d/c for tomorrow; hospital stay may need extended if stool volume increases; he is set up for fluids on Sunday and fluids/MD visit on Monday  ??    Melvyn Neth, CNP      David Watkins A. Drucilla Schmidt, DO, MS

## 2016-09-11 NOTE — Plan of Care (Signed)
Problem: Infection - Central Venous Catheter-Associated Bloodstream Infection:  Goal: Will show no infection signs and symptoms  Will show no infection signs and symptoms   Outcome: Ongoing  CVC site remains free of signs/symptoms of infection. No drainage, edema, erythema, pain, or warmth noted at site. Dressing changes continue per protocol and on an as needed basis - see flowsheet. Performed CHG bath today per [facility] protocol utilizing CHG solution in the shower.  CVC site cleansed with CHG wipe over dressing, skin surrounding dressing, and first 6" of IV tubing.  Pt tolerated well.  Continued to encourage daily CHG bathing per [facility] protocol.        Problem: PROTECTIVE PRECAUTIONS  Goal: Patient will remain free of nosocomial Infections  Outcome: Ongoing  Pt remains in protective precautions.  Pt educated on wearing mask when in hallways. Pt, staff, and visitors adhering to handwashing guidelines. Pt educated to shower or bathe daily with chlorhexidine and linens changed daily per protocol. Pt verbalizes understanding of low microbial diet. Will continue to monitor.     Problem: Pain:  Goal: Control of chronic pain  Control of chronic pain   Outcome: Ongoing  Patient c/o 6/10 chronic back pain this shift that radiates to R leg. Given scheduled MS contin and heat applied for breakthrough pain; patient declines PRN pain medication so far this shift. Encouraged to call with pain needs. Will monitor.     Problem: Falls - Risk of:  Goal: Will remain free from falls  Will remain free from falls   Outcome: Ongoing  Patient remains free from falls this shift. Patient is a medium fall risk. Up ad lib with steady gait, orthostatic readings negative. Patient wears personal non-skid foot wear. Bed in low, locked position. Chair in locked position. Encouraged to call with needs, call light within reach. Will continue to monitor and reassess throughout shift.     Problem: Nausea/Vomiting:  Goal: Absence of nausea/vomiting  Absence  of nausea/vomiting   Outcome: Ongoing  Patient denies nausea this shift. Encouraged to call with any changes. Will monitor.     Problem: Nutrition Deficit:  Goal: Ability to achieve adequate nutritional intake will improve  Ability to achieve adequate nutritional intake will improve   Outcome: Ongoing  Patient continues to have poor appetite this shift. Has had 50% of ensure today. Mashed potatoes with gravy and second ensure en route. Will continue to encourage small, frequent meals and oral supplements.     Problem: Bleeding:  Goal: Will show no signs and symptoms of excessive bleeding  Will show no signs and symptoms of excessive bleeding   Outcome: Ongoing  Patient's hemoglobin this AM:   Recent Labs      09/11/16   0335   HGB  8.5*     Patient's platelet count this AM:   Recent Labs      09/11/16   0335   PLT  27*    Thrombocytopenia Precautions in place.  Patient showing no signs or symptoms of active bleeding.  Transfusion not indicated at this time.  Patient verbalizes understanding of all instructions. Will continue to assess and implement POC. Call light within reach and hourly rounding in place.     Problem: Venous Thromboembolism:  Goal: Will show no signs or symptoms of venous thromboembolism  Will show no signs or symptoms of venous thromboembolism   Outcome: Ongoing  Pt is at risk for DVT d/t decreased mobility and cancer treatment.  Pt educated on importance of activity.  Pt has orders for SCDs while in bed.  Pt verbalizes understanding of need for prophylaxis while inpatient. Patient up in chair and ambulating to restroom frequently. No SCDs while up in chair.     Problem: Activity:  Goal: Ability to tolerate increased activity will improve  Ability to tolerate increased activity will improve  Outcome: Ongoing  Pt with activity orders for up ad lib.  Encouraged pt to be up OOB as much as possible throughout the day and for all meals.  Encouraged frequent short naps as necessary to preserve energy  but instructed that while awake, pt should be OOB. Encouraged pt to ambulate in halls. Patient ambulates in room to restroom frequently; has not walked halls. Patient encouraged to do at least one lap this shift. Pt is visualized to be OOB 51-75% of the time this shift.  Will continue to encourage frequent activity.

## 2016-09-11 NOTE — Plan of Care (Signed)
Problem: Diarrhea:  Goal: Bowel elimination is within specified parameters  Bowel elimination is within specified parameters  Outcome: Not Met This Shift  Patient continues to have multiple liquid stools. On scheduled anti-diarrheals; see MAR.

## 2016-09-11 NOTE — Care Coordination-Inpatient (Signed)
Type of Admission  Multiple Myeloma IgG  Melphalan Auto ( T:0: 08/28/16)  Day +14  1/  1/    Central venous catheter  Left SC Tri fusion Catheter ( 08/17/16, Dr. Hal Neer rat)        Plan  Proceed with Melphalan Auto SCT for treatment of Multiple Myeloma        Update  09/03/15:  Will begin daily Granix today. Reports having no appetite but still attempting.  09/07/16:  No blood count recovery as yet.  Continues to have loose stools, Lomotil added in addition to Imodium.  09/08/16:  Continue to communicate with brother, Harrie Jeans regarding impending discharge. Patient had >1 liter of stool in 24 hours, imodium & lomotil scheduled.  09/11/16:  WBC continues to recover. Continues to have loose stool, states he does not feel he is ready for discharge.    Education  08/26/16: Introduced myself in Clinician/Navigator role. Discussed use of cryotherapy with Melphalan, verbalized understanding.  Reviewed timeline of recovery, states that he has a calendar that his brothers & sisters are assisting post discharge, all if his siblings live out of town.  Confirmed local Pharmacy  09/02/16:  Discussed use of Granix  To assist in wbc recovery.  Reinforced timeline for recovery, days 11-14 & criteria for discharge, afebrile, ANC recovering & adequate nutrition.  09/07/16:  Team discussed with patient that should be able to predict discharge date ny Wed, so that family can assist post discharge.  09/07/16:  Spoke with Sister in [place], Farhad Burleson regarding readiness for home.  Patient has multiple family friends that have scheduled time for Care giver coverage.  09/07/16:  Patient  A lot of encouragement to eat, acknowledges that he has not eaten since yesterday when he ate yogurt.  Stressed that he needs to consider eating like  Scheduled medicine & that adequate nutrition is essential for discharge.  09/11/16:  Discussed  Impending discharge with patient & [name].  Brother & sister in law coming into to town from [place] today.  Re-assurred patient that  he will have close follow up in the the [facility] office.  Reviewed need to maintain food journal for food & hydration.  Discussed re-vaccination in 6 months.  Reviewed low microbial diet with patient & [name].  Given [facility] card & reviewed use.  Discharge  When [place] recovers, afebrile & adequate nutrition        Pending  See AVS for Follow up appointmnent----> Sunday, 1/14 @ 10:45 &  For RN Labs & IV Fluid  1/15 Monday,follow up with MD @ 09:45

## 2016-09-11 NOTE — Plan of Care (Addendum)
Problem: Infection - Central Venous Catheter-Associated Bloodstream Infection:  Goal: Will show no infection signs and symptoms  Will show no infection signs and symptoms   Outcome: Ongoing  CVC site remains free of signs/symptoms of infection. No drainage, edema, erythema, pain, or warmth noted at site. Dressing changes continue per protocol and on an as needed basis - see flowsheet.     Refusing BCC Bath Protocol:  Despite multiple attempts by this RN, pt refusing shower or bed bath with CHG today.  Discussed risks associated with not following BCC bath protocol including increased risk of CVC line infection & sepsis in an immunocompromised pt.  Will discuss continued refusal with treatment team if pt continues to refuse daily bath protocol for 2 or more days.  CVC site cleansed with CHG wipe over dressing, skin surrounding dressing, and first 6" of IV tubing.  Pt tolerated well.  Continued to encourage daily CHG bathing per [facility] protocol.    Problem: PROTECTIVE PRECAUTIONS  Goal: Patient will remain free of nosocomial Infections  Outcome: Ongoing  Pt remains in protective precautions. No living plants or fresh flowers in his/her room. Patient educated on wearing mask when in hallways. Patient, staff, and visitors adhering to handwashing guidelines. Patient cleansed with chlorhexidine wipes and linens changed daily per protocol. Pt verbalizes understanding of low microbial diet. Patient remains free of nosocomial infections.      Problem: Pain:  Goal: Pain level will decrease  Pain level will decrease   Outcome: Ongoing  Pt continues to complain of chronic back pain and has MS Contin and Baclofen scheduled. Morphine PO is also ordered for breakthrough pain. PT slept throughout the night with minimal complaints.    Problem: Falls - Risk of:  Goal: Will remain free from falls  Will remain free from falls   Outcome: Ongoing  Orthostatic vital signs obtained at start of shift - see flowsheet for details.  Pt does not  meet criteria for orthostasis.  Pt is a Med fall risk. See Lattie Corns Fall Score and ABCDS Injury Risk assessments.     - Screening for Orthostasis AND not a High Falls Risk per MORSE/ABCDS: Pt bed is in low position, side rails up, call light and belongings are in reach.  Fall risk light is on outside pts room.  Pt encouraged to call for assistance as needed. Will continue with hourly rounds for PO intake, pain needs, toileting and repositioning as needed.     Problem: Nausea/Vomiting:  Goal: Absence of nausea/vomiting  Absence of nausea/vomiting   Outcome: Ongoing  No complaints of n/v this shift.    Problem: Nutrition Deficit:  Goal: Ability to achieve adequate nutritional intake will improve  Ability to achieve adequate nutritional intake will improve   Outcome: Ongoing  Pt continues to have a decreased appetite and is encourage to eat small frequent meals. Pt educated on the importance of nutrition and being able to go home.    Problem: Bleeding:  Goal: Will show no signs and symptoms of excessive bleeding  Will show no signs and symptoms of excessive bleeding   Outcome: Ongoing  Patient's hemoglobin this AM:   Recent Labs      09/11/16   0335   HGB  8.5*     Patient's platelet count this AM:   Recent Labs      09/11/16   0335   PLT  27*    Thrombocytopenia Precautions in place.  Patient showing no signs or symptoms of active bleeding.  Transfusion not indicated at this time.  Patient verbalizes understanding of all instructions. Will continue to assess and implement POC. Call light within reach and hourly rounding in place.     Problem: Venous Thromboembolism:  Goal: Will show no signs or symptoms of venous thromboembolism  Will show no signs or symptoms of venous thromboembolism   Outcome: Ongoing  Adherent with DVT Prevention: Pt is at risk for DVT d/t decreased mobility and cancer treatment.  Pt educated on importance of activity. Pt verbalizes understanding of need for prophylaxis while inpatient.

## 2016-09-11 NOTE — Plan of Care (Signed)
Problem: Nutrition  Goal: Optimal nutrition therapy  Outcome: Ongoing  Nutrition Problem: Inadequate oral intake  Intervention: Food and/or Nutrient Delivery: Continue current diet, Continue current ONS  Nutritional Goals: pt will tolerate and consume 50% or more of all meals and supplements offered

## 2016-09-12 ENCOUNTER — Inpatient Hospital Stay: Admit: 2016-09-12 | Primary: Hematology & Oncology

## 2016-09-12 LAB — CBC WITH AUTO DIFFERENTIAL
Bands Relative: 4 % (ref 0–7)
Basophils %: 0 %
Basophils Absolute: 0 10*3/uL (ref 0.0–0.2)
Eosinophils %: 0 %
Eosinophils Absolute: 0 10*3/uL (ref 0.0–0.6)
Hematocrit: 23.8 % — ABNORMAL LOW (ref 40.5–52.5)
Hemoglobin: 8.3 g/dL — ABNORMAL LOW (ref 13.5–17.5)
Lymphocytes %: 12 %
Lymphocytes Absolute: 0.5 10*3/uL — ABNORMAL LOW (ref 1.0–5.1)
MCH: 29 pg (ref 26.0–34.0)
MCHC: 34.7 g/dL (ref 31.0–36.0)
MCV: 83.6 fL (ref 80.0–100.0)
MPV: 9.3 fL (ref 5.0–10.5)
Metamyelocytes Relative: 1 % — AB
Monocytes %: 28 %
Monocytes Absolute: 1.1 10*3/uL (ref 0.0–1.3)
Myelocyte Percent: 2 % — AB
Neutrophils %: 53 %
Neutrophils Absolute: 2.3 10*3/uL (ref 1.7–7.7)
PLATELET SLIDE REVIEW: DECREASED
Platelets: 37 10*3/uL — ABNORMAL LOW (ref 135–450)
RBC: 2.85 M/uL — ABNORMAL LOW (ref 4.20–5.90)
RDW: 13.6 % (ref 12.4–15.4)
WBC: 3.9 10*3/uL — ABNORMAL LOW (ref 4.0–11.0)

## 2016-09-12 LAB — PHOSPHORUS: Phosphorus: 1.9 mg/dL — ABNORMAL LOW (ref 2.5–4.9)

## 2016-09-12 LAB — BASIC METABOLIC PANEL
Anion Gap: 12 (ref 3–16)
BUN: 3 mg/dL — ABNORMAL LOW (ref 7–20)
CO2: 23 mmol/L (ref 21–32)
Calcium: 7.3 mg/dL — ABNORMAL LOW (ref 8.3–10.6)
Chloride: 101 mmol/L (ref 99–110)
Creatinine: 0.6 mg/dL — ABNORMAL LOW (ref 0.9–1.3)
GFR African American: >60 (ref >60)
GFR Non-African American: >60 (ref >60)
Glucose: 93 mg/dL (ref 70–99)
Potassium: 3.7 mmol/L (ref 3.5–5.1)
Sodium: 136 mmol/L (ref 136–145)

## 2016-09-12 LAB — LACTIC ACID: Lactic Acid: 1.5 mmol/L (ref 0.4–2.0)

## 2016-09-12 LAB — C DIFF TOXIN/ANTIGEN: C difficile Toxin, EIA: NEGATIVE

## 2016-09-12 LAB — URIC ACID: Uric Acid, Serum: 2.5 mg/dL — ABNORMAL LOW (ref 3.5–7.2)

## 2016-09-12 MED ORDER — STERILE WATER FOR INJECTION (MIXTURES ONLY)
1 | Freq: Three times a day (TID) | INTRAVENOUS | Status: AC
Start: 2016-09-12 — End: 2016-09-15
  Administered 2016-09-12 – 2016-09-15 (×9): 1 g via INTRAVENOUS

## 2016-09-12 MED ORDER — ACETAMINOPHEN 325 MG PO TABS
325 | ORAL | Status: DC | PRN
Start: 2016-09-12 — End: 2016-09-16
  Administered 2016-09-12: 22:00:00 650 mg via ORAL

## 2016-09-12 MED FILL — MEROPENEM 1 G IV SOLR: 1 g | INTRAVENOUS | Qty: 1

## 2016-09-12 MED FILL — SERTRALINE HCL 50 MG PO TABS: 50 MG | ORAL | Qty: 1

## 2016-09-12 MED FILL — DIPHENOXYLATE-ATROPINE 2.5-0.025 MG PO TABS: ORAL | Qty: 1

## 2016-09-12 MED FILL — PANTOPRAZOLE SODIUM 40 MG PO TBEC: 40 MG | ORAL | Qty: 1

## 2016-09-12 MED FILL — BACLOFEN 10 MG PO TABS: 10 MG | ORAL | Qty: 2

## 2016-09-12 MED FILL — KCL IN DEXTROSE-NACL 30-5-0.45 MEQ/L-%-% IV SOLN: INTRAVENOUS | Qty: 1000

## 2016-09-12 MED FILL — LOPERAMIDE HCL 2 MG PO CAPS: 2 MG | ORAL | Qty: 2

## 2016-09-12 MED FILL — MORPHINE SULFATE 15 MG PO TABS: 15 mg | ORAL | Qty: 1

## 2016-09-12 MED FILL — VALACYCLOVIR HCL 500 MG PO TABS: 500 MG | ORAL | Qty: 1

## 2016-09-12 MED FILL — CETIRIZINE HCL 10 MG PO TABS: 10 MG | ORAL | Qty: 1

## 2016-09-12 MED FILL — ACETAMINOPHEN 325 MG PO TABS: 325 MG | ORAL | Qty: 2

## 2016-09-12 MED FILL — ORAMORPH SR 30 MG PO TBCR: 30 mg | ORAL | Qty: 1

## 2016-09-12 MED FILL — POTASSIUM CHLORIDE CRYS ER 20 MEQ PO TBCR: 20 MEQ | ORAL | Qty: 1

## 2016-09-12 MED FILL — LORAZEPAM 1 MG PO TABS: 1 MG | ORAL | Qty: 1

## 2016-09-12 MED FILL — SODIUM CHLORIDE 0.9 % IR SOLN: 0.9 % | Qty: 15

## 2016-09-12 NOTE — Progress Notes (Signed)
David Watkins  Autologous Progress Note    09/12/2016    David Watkins    DOB:  07/24/1956    MRN:  [id number]    Referring MD: Eulis Canner, MD  [address] [place] [address]    Subjective: David Watkins is weak.  Continues to poor appetite and profuse diarrhea.  His pain is reasonably well controlled.  ECOG PS:  2    Isolation:  None     Medications    Scheduled Meds:  ??? potassium chloride  20 mEq Oral BID WC   ??? loperamide  4 mg Oral 4 times per day   ??? diphenoxylate-atropine  1 tablet Oral 4 times per day   ??? morphine  30 mg Oral BID   ??? baclofen  20 mg Oral TID   ??? sertraline  50 mg Oral Daily   ??? Saline Mouthwash  15 mL Swish & Spit 4x Daily AC & HS   ??? sodium chloride flush  10 mL Intravenous 2 times per day   ??? valACYclovir  500 mg Oral BID   ??? umeclidinium-vilanterol  1 puff Inhalation Daily   ??? cetirizine  10 mg Oral Daily   ??? pantoprazole  40 mg Oral QAM AC     Continuous Infusions:  ??? dextrose 5% and 0.45% NaCl with KCl 30 mEq 100 mL/hr at 09/11/16 2349   ??? sodium chloride     ??? sodium chloride       PRN Meds:.ondansetron, morphine, sodium chloride, sodium chloride, alteplase, magnesium hydroxide, magnesium sulfate, potassium chloride, Saline Mouthwash, prochlorperazine **OR** prochlorperazine, LORazepam **OR** LORazepam, albuterol sulfate HFA, polyethylene glycol, LORazepam    ROS:  ?? Constitutional: Denies fever, sweats, weight loss.  ?? Eyes: No visual changes or diplopia. No scleral icterus.  ?? ENT: No Headaches, hearing loss or vertigo. No mouth sores or sore throat.  ?? Cardiovascular: No chest pain, dyspnea on exertion, palpitations or loss of consciousness.   ?? Respiratory: No cough or wheezing, no sputum production. No hemoptysis.  ?? Gastrointestinal: No abdominal pain, no blood in stools. +Diarrhea.  +Appetite loss  ?? Genitourinary: No dysuria, trouble voiding, or hematuria.  ?? Musculoskeletal:  +generalized weakness & back pain. No joint complaints.  ?? Integumentary: No rash or  pruritis.  ?? Neurological: No headache, diplopia. No change in gait, balance, or coordination. No paresthesias.  ?? Endocrine: No temperature intolerance. No excessive thirst, fluid intake, or urination.   ?? Hematologic/Lymphatic: No abnormal bruising or ecchymoses, blood clots or swollen lymph nodes.  ?? Allergic/Immunologic: No nasal congestion or hives.       Physical Exam:     I&O:      Intake/Output Summary (Last 24 hours) at 09/12/16 0846  Last data filed at 09/12/16 0834   Gross per 24 hour   Intake             3133 ml   Output             1175 ml   Net             1958 ml       Vital Signs:  BP (!) 112/58    Pulse 102    Temp 100.3 ??F (37.9 ??C) (Oral)    Resp 18    Ht 5' 8.74" (1.746 m)    Wt 190 lb 1.6 oz (86.2 kg)    SpO2 94%    BMI 28.29 kg/m??     Weight:  Wt Readings from Last 3 Encounters:   09/11/16 190 lb 1.6 oz (86.2 kg)   08/17/16 192 lb (87.1 kg)   08/10/16 202 lb (91.6 kg)       General: Awake, alert and oriented ??  HEENT: normocephalic, alopecia, PERRL, no scleral erythema or icterus, Oral mucosa moist and intact, throat clear.   NECK: supple without palpable adenopathy  BACK: Straight, negative CVAT  SKIN: warm dry and intact without lesions rashes or masses  CHEST: CTA bilaterally without use of accessory muscles  CV: Normal S1 S2, RRR, no MRG  ABD: NT ND normoactive BS, no palpable masses or hepatosplenomegaly  EXTREMITIES: without edema, denies calf tenderness  NEURO: CN II - XII grossly intact  CATHETER: LSC Trifusion (Barrat, 08/17/16) - CDI, no erythema      Data:   CBC:   Recent Labs      09/10/16   0327  09/11/16   0335  09/12/16   0330   WBC  1.5*  2.8*  3.9*   HGB  8.2*  8.5*  8.3*   HCT  23.9*  25.2*  23.8*   MCV  84.7  85.1  83.6   PLT  21*  27*  37*     BMP/Mag:  Recent Labs      09/10/16   0327  09/11/16   0335  09/12/16   0330   NA  138  136  136   K  3.5  3.8  3.7   CL  104  102  101   CO2  23  22  23    PHOS  1.5*  2.2*  1.9*   BUN  <2*  <2*  3*   CREATININE  0.6*  0.6*  0.6*    MG  1.50*   --    --      LIVP:   Recent Labs      09/11/16   0335   AST  11*   ALT  9*   BILIDIR  <0.2   BILITOT  0.3   ALKPHOS  89     Uric Acid:    Recent Labs      09/12/16   0330   LABURIC  2.5*     Coags:   Recent Labs      09/10/16   0327   PROTIME  14.5*   INR  1.28*   APTT  28.5     DIAGNOSTICS:   1. CT Thoracic & Lumbar Spine 08/31/16  Thoracic: Diffuse lytic skeletal metastatic disease compatible with  patient's clinical history of multiple myeloma, most pronounced at  the T6 and T10 vertebrae as detailed in body report. Potential  intraspinal involvement by metastatic disease at T6 and T10  levels, but no evidence for significant thecal sac compression on  noncontrast CT.  No fracture or thoracic spine infection.  Small right pleural effusion  Lumbar: Diffuse lytic skeletal metastatic disease compatible with  patient's clinical history of multiple myeloma. No fracture.  No fracture or lumbar spine infection  L5-S1 severe disc degeneration and grade I isthmic  spondylolisthesis contributing to severe bilateral L5 foraminal  stenosis.    PROBLEM LIST: ????????   ????  Multiple myeloma  Hyperlipidemia  HTN  Neoplasm related pain    Post-Transplant Complications:  1. Anorexia  2. Grade 3 Diarrhea  ????  TREATMENT: ????????   ????  1. Rad Tx to T5-7, T10-L3, Right Scapula - 3000 cGy - Dr. Jamas Lav 03/20/16-04/01/16  2.  RVD x1 03/20/16 - discontinued d/t rash   3. Velcade/Pomalyst/Dex x3 cycles 04/23/16-06/17/16 - discontinued d/t reaction to pomalyst  4. Velcade/Dex x 1 cycle 06/26/16 (last dose of dexamethasone 07/22/16  5. High-dose melphalan followed by administration of PBSCs 2.36 x10^6 cd34cells/kg on 08/28/16  ??  ASSESSMENT AND PLAN: ????????   ????  1.  IgG kappa multiple myeloma: PR after 5 cycles chemotherapy - M-spike is 0.4 & BM biopsy 2 % plasma cells  - Admitted for melphalan 238m/m2 preparative regimen followed by administration of PBSCs 2.36 x10^6 cd34cells/kg on 08/28/16. He is NOT an early release candidate d/t his  lack of social support and high anxiety    Day + 15  ??  2.  ID:  His T-max 100.3.  The only localizing source of infection would be his diarrhea.  I will repeat C. Difficile today.   - Cont Valtrex ppx, Acyclovir at discharge for VZV positive titer.    - Diflucan & Levaquin ppx stopped 09/10/16  ??  3.  Heme:  Pancytopenia from chemotherapy, ANC & plts engrafting  - Transfuse for Hgb < 7 and Platelets < 10K  - No transfusion today  - Granix 09/02/16, last dose today (09/11/16)  ??  4.  Metabolic: HypoCa/HypoPhos, other electrolytes are WNL and renal fxn stable.   - Cont IVF hydration: D51/2NS w/30KCl @ 1060mhr, likely hypokalemia from fluid losses r/t diarrhea  - Replace potassium and magnesium per orders  - Strict I/O   - Oral Klor-Con 2028mbid   ??  5. GI/Nutrition:  Mild Malnutrition w/Poor appetite & intake, but attempting to increase. Ongoing diarrhea  - Cont low microbial diet  - Oral intake behind during stay, evident by BUN < 2; needs strong encouragement to eat  - Encourage oral supplements - multiple discussions with pt about attempting oral intake have been had daily  - GERD: Cont PPI  - Diarrhea: CDiff neg 09/02/16.  Immodium and Lomotil scheduled  ??  6. Pain: Chronic back & rib pain s/p radtx; acute flare of back pain  - MS contin 30 mg BID; MSIR 15 mg q4h; baclofen 20 tid  - CT 08/31/16: no new pathology  ??  7. 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  8. Chronic Bronchitis/COPD: As evidenced by pre-transplant PFTs - FEV1 is 43%, He did respond to bronchodilators and went up to 52%  - Cont albuterol PRN  - Cont anoro ellipta inhaler  - Cleared for transplant by Dr. MurPercell Miller?  9. Cardiac: h/o HLD. 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 - Cleared for BMT by Dr. WhaEliane Decree HTN: Lisinopril 50m76mily, hold today due to low  BP's  ??  10. Anxiety/Depression  - Ativan PRN  - Dr. SontJavier Dockersulted  - Started Zoloft qhs this admit  ??  11. Insomnia:  - Ativan PRN qHS    12. Debilitation: d/t malnutrition & back pain  - PT/OT signed off.  Pt doing well.      - DVT Prophylaxis: Platelets <50,000 cells/dL - prophylactic lovenox on hold and mechanical prophylaxis with bilateral SCDs while in bed in place.  Contraindications to pharmacologic prophylaxis: Thrombocytopenia  Contraindications to mechanical prophylaxis: None    - Disposition:  No transportation today, so set up d/c for tomorrow;  hospital stay may need extended if stool volume increases; he is set up for fluids on Sunday and fluids/MD visit on Monday  ??    Harlene Salts, MD  [facility]  [phone]

## 2016-09-12 NOTE — Plan of Care (Signed)
Problem: Infection - Central Venous Catheter-Associated Bloodstream Infection:  Goal: Will show no infection signs and symptoms  Will show no infection signs and symptoms   Outcome: Ongoing  CVC site without signs or symptoms of infection. Patient febrile this afternoon. Neutropenic protocol initiated. Explained process to patient. Patient has no complaints other than back pain. Patient has two water stools this shift. Stool sent for Cdiff which was negative.    Problem: PROTECTIVE PRECAUTIONS  Goal: Patient will remain free of nosocomial Infections  Outcome: Ongoing  Protective precautions in place. Patient verbalizes and demonstrates understanding of protective precautions.    Problem: Pain:  Goal: Pain level will decrease  Pain level will decrease   Outcome: Ongoing  Complains of lower back pain. This is not new. Morphine given. Patient verbalizes tolerable relief with pain med.    Problem: Falls - Risk of:  Goal: Will remain free from falls  Will remain free from falls   Orthostatic vital signs obtained at start of shift - see flowsheet for details.  Pt does not meet criteria for orthostasis.  Pt is a Med fall risk. See Lattie Corns Fall Score and ABCDS Injury Risk assessments.     - Screening for Orthostasis AND not a High Falls Risk per MORSE/ABCDS: Pt bed is in low position, side rails up, call light and belongings are in reach.  Fall risk light is on outside pts room.  Pt encouraged to call for assistance as needed. Will continue with hourly rounds for PO intake, pain needs, toileting and repositioning as needed.     Patient walked hall twice this shift.    Problem: Nausea/Vomiting:  Goal: Absence of nausea/vomiting  Absence of nausea/vomiting   Outcome: Ongoing  No complaints of nausea. Patient does have decreased appetite. Patient drinking ensures and had 1/2 serving of mashed potatoes    Problem: Bleeding:  Goal: Will show no signs and symptoms of excessive bleeding  Will show no signs and symptoms of excessive  bleeding   Outcome: Ongoing  Patient's hemoglobin this AM:   Recent Labs      09/12/16   0330   HGB  8.3*     Patient's platelet count this AM:   Recent Labs      09/12/16   0330   PLT  37*    Thrombocytopenia Precautions in place.  Patient showing no signs or symptoms of active bleeding.  Transfusion not indicated at this time.  Patient verbalizes understanding of all instructions. Will continue to assess and implement POC. Call light within reach and hourly rounding in place.

## 2016-09-12 NOTE — Plan of Care (Signed)
D: Patient states he used commode 4 x over night, nothing to measure as patient took hat out of toilet and voided/stooled directly into commode.    A: RN educated patient on importance of intake and output measurement.  Use urinal and hat, report what he is drinking and eating.  Important for consideration to go home.  Patient verbalized understanding, although he stated, "I was a bad boy last night."    R: Monitor patient and continue to educate compliance.

## 2016-09-12 NOTE — Oncology Nurse Navigation (Signed)
Neutropenic Pathway  If patient oral temp > or = to 38.0 or if axillary temp > or = to 37.4 initiate protocol per orders.  Draw 2 sets of blood cultures from different sites. If patient remains febrile, redraw PAN culture every 68-72 hours.             Temp 100.6    Site(s) / Line Type Time Cultures obtained   Date 09/12/2016  1610    red   blue  1645

## 2016-09-13 LAB — MICROSCOPIC URINALYSIS

## 2016-09-13 LAB — URINALYSIS
Bilirubin Urine: NEGATIVE
Blood, Urine: NEGATIVE
Glucose, Ur: NEGATIVE mg/dL
Leukocyte Esterase, Urine: NEGATIVE
Nitrite, Urine: NEGATIVE
Specific Gravity, UA: 1.025 (ref 1.005–1.030)
Urobilinogen, Urine: 0.2 E.U./dL (ref <2.0)
pH, UA: 5.5 (ref 5.0–8.0)

## 2016-09-13 LAB — CBC WITH AUTO DIFFERENTIAL
Bands Relative: 5 % (ref 0–7)
Basophils %: 0 %
Basophils Absolute: 0 10*3/uL (ref 0.0–0.2)
Eosinophils %: 0 %
Eosinophils Absolute: 0 10*3/uL (ref 0.0–0.6)
Hematocrit: 23.6 % — ABNORMAL LOW (ref 40.5–52.5)
Hemoglobin: 8.2 g/dL — ABNORMAL LOW (ref 13.5–17.5)
Lymphocytes %: 6 %
Lymphocytes Absolute: 0.2 10*3/uL — ABNORMAL LOW (ref 1.0–5.1)
MCH: 29.3 pg (ref 26.0–34.0)
MCHC: 34.8 g/dL (ref 31.0–36.0)
MCV: 84.1 fL (ref 80.0–100.0)
MPV: 8.7 fL (ref 5.0–10.5)
Metamyelocytes Relative: 3 % — AB
Monocytes %: 34 %
Monocytes Absolute: 1.3 10*3/uL (ref 0.0–1.3)
Myelocyte Percent: 1 % — AB
Neutrophils %: 51 %
Neutrophils Absolute: 2.2 10*3/uL (ref 1.7–7.7)
PLATELET SLIDE REVIEW: DECREASED
Platelets: 46 10*3/uL — ABNORMAL LOW (ref 135–450)
RBC: 2.8 M/uL — ABNORMAL LOW (ref 4.20–5.90)
RDW: 13.5 % (ref 12.4–15.4)
WBC: 3.7 10*3/uL — ABNORMAL LOW (ref 4.0–11.0)

## 2016-09-13 LAB — BASIC METABOLIC PANEL
Anion Gap: 11 (ref 3–16)
BUN: 5 mg/dL — ABNORMAL LOW (ref 7–20)
CO2: 24 mmol/L (ref 21–32)
Calcium: 7.6 mg/dL — ABNORMAL LOW (ref 8.3–10.6)
Chloride: 94 mmol/L — ABNORMAL LOW (ref 99–110)
Creatinine: 0.6 mg/dL — ABNORMAL LOW (ref 0.9–1.3)
GFR African American: >60 (ref >60)
GFR Non-African American: >60 (ref >60)
Glucose: 89 mg/dL (ref 70–99)
Potassium: 3.5 mmol/L (ref 3.5–5.1)
Sodium: 129 mmol/L — ABNORMAL LOW (ref 136–145)

## 2016-09-13 LAB — URIC ACID: Uric Acid, Serum: 2.9 mg/dL — ABNORMAL LOW (ref 3.5–7.2)

## 2016-09-13 LAB — PHOSPHORUS: Phosphorus: 1.5 mg/dL — ABNORMAL LOW (ref 2.5–4.9)

## 2016-09-13 LAB — FECAL LEUKOCYTES: White Blood Cells (WBC), Stool: POSITIVE — AB

## 2016-09-13 MED ORDER — POTASSIUM CHLORIDE 2 MEQ/ML IV SOLN
2 MEQ/ML | INTRAVENOUS | Status: DC
Start: 2016-09-13 — End: 2016-09-13

## 2016-09-13 MED ORDER — DEXTROSE-NACL 5-0.9 % IV SOLN
INTRAVENOUS | Status: DC
Start: 2016-09-13 — End: 2016-09-14
  Administered 2016-09-13 – 2016-09-14 (×2): via INTRAVENOUS

## 2016-09-13 MED FILL — LOPERAMIDE HCL 2 MG PO CAPS: 2 MG | ORAL | Qty: 2

## 2016-09-13 MED FILL — POTASSIUM CHLORIDE CRYS ER 20 MEQ PO TBCR: 20 MEQ | ORAL | Qty: 1

## 2016-09-13 MED FILL — PANTOPRAZOLE SODIUM 40 MG PO TBEC: 40 MG | ORAL | Qty: 1

## 2016-09-13 MED FILL — ORAMORPH SR 30 MG PO TBCR: 30 mg | ORAL | Qty: 1

## 2016-09-13 MED FILL — BACLOFEN 10 MG PO TABS: 10 MG | ORAL | Qty: 2

## 2016-09-13 MED FILL — DIPHENOXYLATE-ATROPINE 2.5-0.025 MG PO TABS: ORAL | Qty: 1

## 2016-09-13 MED FILL — VALACYCLOVIR HCL 500 MG PO TABS: 500 MG | ORAL | Qty: 1

## 2016-09-13 MED FILL — MEROPENEM 1 G IV SOLR: 1 g | INTRAVENOUS | Qty: 1

## 2016-09-13 MED FILL — SERTRALINE HCL 50 MG PO TABS: 50 MG | ORAL | Qty: 1

## 2016-09-13 MED FILL — DEXTROSE-NACL 5-0.9 % IV SOLN: INTRAVENOUS | Qty: 1000

## 2016-09-13 MED FILL — LORAZEPAM 1 MG PO TABS: 1 MG | ORAL | Qty: 1

## 2016-09-13 MED FILL — MORPHINE SULFATE 15 MG PO TABS: 15 mg | ORAL | Qty: 1

## 2016-09-13 MED FILL — KCL IN DEXTROSE-NACL 30-5-0.45 MEQ/L-%-% IV SOLN: INTRAVENOUS | Qty: 1000

## 2016-09-13 MED FILL — CETIRIZINE HCL 10 MG PO TABS: 10 MG | ORAL | Qty: 1

## 2016-09-13 NOTE — Plan of Care (Signed)
Problem: Infection - Central Venous Catheter-Associated Bloodstream Infection:  Goal: Will show no infection signs and symptoms  Will show no infection signs and symptoms   Outcome: Ongoing  CVC site remains free of signs/symptoms of infection. No drainage, edema, erythema, pain, or warmth noted at site. Dressing changes continue per protocol and on an as needed basis - see flowsheet.       Refusing BCC Bath Protocol:  Despite multiple attempts by this RN, pt refusing shower or bed bath with CHG today.  Discussed risks associated with not following BCC bath protocol including increased risk of CVC line infection & sepsis in an immunocompromised pt.  Will discuss continued refusal with treatment team if pt continues to refuse daily bath protocol for 2 or more days.  CVC site cleansed with CHG wipe over dressing, skin surrounding dressing, and first 6" of IV tubing.  Pt tolerated well.  Continued to encourage daily CHG bathing per [facility] protocol.    Problem: PROTECTIVE PRECAUTIONS  Goal: Patient will remain free of nosocomial Infections  Outcome: Ongoing  Pt remains in neutropenic precautions per floor policy. Pt, visitors, and staff noted to be following precautions appropriately. Handwashing in place; pt wearing mask in hallway per protocol. Pt in private room. Low microbial diet in place. Will continue to monitor.    Problem: Pain:  Goal: Pain level will decrease  Pain level will decrease   Outcome: Ongoing  Pt complains of chronic back pain.  Rates 6-8/10.  Medicated with prn morphine per pt request (see MAR).  Pt reports adequate relief following medication administration.  Will cont to monitor.     Problem: Falls - Risk of:  Goal: Will remain free from falls  Will remain free from falls   Outcome: Ongoing  Orthostatic vital signs obtained at start of shift - see flowsheet for details.  Pt meets criteria for orthostasis.  Pt is a Med fall risk. See Lattie Corns Fall Score and ABCDS Injury Risk assessments.     - Screening  for Orthostasis AND not a High Falls Risk per MORSE/ABCDS: Pt bed is in low position, side rails up, call light and belongings are in reach.  Fall risk light is on outside pts room.  Pt encouraged to call for assistance as needed. Will continue with hourly rounds for PO intake, pain needs, toileting and repositioning as needed.     Problem: Nausea/Vomiting:  Goal: Absence of nausea/vomiting  Absence of nausea/vomiting   Outcome: Ongoing  No complaints at this time. Will continue to monitor.     Problem: Nutrition Deficit:  Goal: Ability to achieve adequate nutritional intake will improve  Ability to achieve adequate nutritional intake will improve   Outcome: Ongoing  Encouraging pt to eat small frequent. Meals. This RN is offering food patient likes. Will continue to monitor,     Problem: Bleeding:  Goal: Will show no signs and symptoms of excessive bleeding  Will show no signs and symptoms of excessive bleeding   Outcome: Ongoing  Patient's hemoglobin this AM:   Recent Labs      09/13/16   0443   HGB  8.2*     Patient's platelet count this AM:   Recent Labs      09/13/16   0443   PLT  46*    Thrombocytopenia not present at this time.  Patient showing no signs or symptoms of active bleeding.  Transfusion not indicated at this time.  Patient verbalizes understanding of all instructions. Will continue to  assess and implement POC. Call light within reach and hourly rounding in place.     Problem: Venous Thromboembolism:  Goal: Will show no signs or symptoms of venous thromboembolism  Will show no signs or symptoms of venous thromboembolism   Outcome: Ongoing    Refusing DVT Prevention: Pt is at risk for DVT d/t decreased mobility and cancer treatment.  Pt educated on importance of activity. Pt has orders for SCDs while in bed, however pt currently refusing treatment.  Reviewed risks of DVT & PE development while inpatient.   Provider aware of patient's refusal and re-education of importance of prophylaxis.  No new  orders at this time.  Will continue to re-instruct patient and intervene as appropriate.      Problem: Diarrhea:  Goal: Bowel elimination is within specified parameters  Bowel elimination is within specified parameters   Outcome: Ongoing  Pt with multiple loose stool this shift, gave scheduled imodium See MAR. Will continue to monitor.

## 2016-09-13 NOTE — Plan of Care (Addendum)
Problem: Infection - Central Venous Catheter-Associated Bloodstream Infection:  Goal: Will show no infection signs and symptoms  Will show no infection signs and symptoms   Outcome: Ongoing  D: Pt is afebrile has no signs or symptoms of infection.  A:  Educated pt on infection risk R: Pt verbalized understanding and will continue to monitor.    Problem: PROTECTIVE PRECAUTIONS  Goal: Patient will remain free of nosocomial Infections  Outcome: Ongoing  Pt educated on wearing mask when in hallways. Pt, staff, and visitors adhering to handwashing guidelines. Pt showers daily with chlorhexidine and linens changed daily per protocol. Pt verbalizes understanding of low microbial diet. Will continue to monitor.     Problem: Pain:  Goal: Pain level will decrease  Pain level will decrease   Outcome: Ongoing  Pt complaining of pain rating it 8/10.  Pt given scheduled pain medication and was asleep on reassessment.    Problem: Nausea/Vomiting:  Goal: Absence of nausea/vomiting  Absence of nausea/vomiting   Outcome: Ongoing  Pt denies any nausea.    Problem: Nutrition Deficit:  Goal: Ability to achieve adequate nutritional intake will improve  Ability to achieve adequate nutritional intake will improve   Outcome: Ongoing  Pt stated that he had not been able to eat much throughout day.  Reinforced to patient that small bland meals would be a good option for him.  He stated that he would try that tomorrow.    Problem: Bleeding:  Goal: Will show no signs and symptoms of excessive bleeding  Will show no signs and symptoms of excessive bleeding   Outcome: Ongoing  Patient's hemoglobin this AM:   Recent Labs      09/13/16   0443   HGB  8.2*     Patient's platelet count this AM:   Recent Labs      09/13/16   0443   PLT  46*    Thrombocytopenia Precautions in place.  Patient showing no signs or symptoms of active bleeding.  Transfusion not indicated at this time.  Patient verbalizes understanding of all instructions. Will continue to  assess and implement POC. Call light within reach and hourly rounding in place.       Problem: Venous Thromboembolism:  Goal: Will show no signs or symptoms of venous thromboembolism  Will show no signs or symptoms of venous thromboembolism   Outcome: Ongoing  Refusing DVT Prevention: Pt is at risk for DVT d/t decreased mobility and cancer treatment.  Pt educated on importance of activity. Pt has orders for SCDs while in bed, however pt currently refusing treatment.  Reviewed risks of DVT & PE development while inpatient.    Pt requesting to have them off dur to bathroom urgency.  Will continue to re-instruct patient and intervene as appropriate.    Problem: Diarrhea:  Goal: Bowel elimination is within specified parameters  Bowel elimination is within specified parameters   Outcome: Ongoing  Pt still having loose stools this shift.  Please see flow sheets for output.

## 2016-09-13 NOTE — Plan of Care (Signed)
Problem: Nutrition Deficit:  Goal: Ability to achieve adequate nutritional intake will improve  Ability to achieve adequate nutritional intake will improve   Outcome: Ongoing  Patient eating with encouragement. Patient ate scoop of mashed potatoes, 7 bites of chicken, fruit cup and a bowl of soup for dinner    Problem: Activity:  Goal: Ability to tolerate increased activity will improve  Ability to tolerate increased activity will improve   Outcome: Ongoing  Patient walked hall twice and did 4 laps. Patient tolerated fairly well    Problem: Diarrhea:  Goal: Bowel elimination is within specified parameters  Bowel elimination is within specified parameters   Outcome: Ongoing  Continues with watery stools. Stools now green in color. Imodium and lomotil given as ordered. Stool specimens sent to lab as per order

## 2016-09-13 NOTE — Progress Notes (Signed)
[facility]  Autologous Progress Note    09/13/2016    David Watkins    DOB:  [birth year]    MRN:  [id number]    Referring MD: Eulis Canner, [place] [address]    Subjective: Still 4 watery stools per day.  Is not associated with any significant abdominal pain.  He starting solids yesterday with no consequence in the stools.  No other symptoms to suggest a source of fever besides his abdomen.  ECOG PS:  2    Isolation:  None     Medications    Scheduled Meds:  ??? meropenem  1 g Intravenous Q8H   ??? potassium chloride  20 mEq Oral BID WC   ??? loperamide  4 mg Oral 4 times per day   ??? diphenoxylate-atropine  1 tablet Oral 4 times per day   ??? morphine  30 mg Oral BID   ??? baclofen  20 mg Oral TID   ??? sertraline  50 mg Oral Daily   ??? Saline Mouthwash  15 mL Swish & Spit 4x Daily AC & HS   ??? sodium chloride flush  10 mL Intravenous 2 times per day   ??? valACYclovir  500 mg Oral BID   ??? umeclidinium-vilanterol  1 puff Inhalation Daily   ??? cetirizine  10 mg Oral Daily   ??? pantoprazole  40 mg Oral QAM AC     Continuous Infusions:  ??? IV infusion builder     ??? sodium chloride     ??? sodium chloride       PRN Meds:.acetaminophen, ondansetron, morphine, sodium chloride, sodium chloride, alteplase, magnesium hydroxide, magnesium sulfate, potassium chloride, Saline Mouthwash, prochlorperazine **OR** prochlorperazine, LORazepam **OR** LORazepam, albuterol sulfate HFA, polyethylene glycol, LORazepam    ROS:  ?? Constitutional: Denies fever, sweats, weight loss.  ?? Eyes: No visual changes or diplopia. No scleral icterus.  ?? ENT: No Headaches, hearing loss or vertigo. No mouth sores or sore throat.  ?? Cardiovascular: No chest pain, dyspnea on exertion, palpitations or loss of consciousness.   ?? Respiratory: No cough or wheezing, no sputum production. No hemoptysis.  ?? Gastrointestinal: No abdominal pain, no blood in stools. +Diarrhea.  +Appetite loss  ?? Genitourinary: No dysuria, trouble voiding, or  hematuria.  ?? Musculoskeletal:  +generalized weakness & back pain. No joint complaints.  ?? Integumentary: No rash or pruritis.  ?? Neurological: No headache, diplopia. No change in gait, balance, or coordination. No paresthesias.  ?? Endocrine: No temperature intolerance. No excessive thirst, fluid intake, or urination.   ?? Hematologic/Lymphatic: No abnormal bruising or ecchymoses, blood clots or swollen lymph nodes.  ?? Allergic/Immunologic: No nasal congestion or hives.       Physical Exam:     I&O:      Intake/Output Summary (Last 24 hours) at 09/13/16 0810  Last data filed at 09/13/16 0624   Gross per 24 hour   Intake             3158 ml   Output             2425 ml   Net              733 ml       Vital Signs:  BP 105/60    Pulse 108    Temp 100.1 ??F (37.8 ??C) (Oral)    Resp 16    Ht 5' 8.74" (1.746 m)    Wt 193 lb  9.6 oz (87.8 kg)    SpO2 92%    BMI 28.81 kg/m??     Weight:    Wt Readings from Last 3 Encounters:   09/13/16 193 lb 9.6 oz (87.8 kg)   08/17/16 192 lb (87.1 kg)   08/10/16 202 lb (91.6 kg)       General: Awake, alert and oriented ??  HEENT: normocephalic, alopecia, PERRL, no scleral erythema or icterus, Oral mucosa moist and intact, throat clear.   NECK: supple without palpable adenopathy  BACK: Straight, negative CVAT  SKIN: warm dry and intact without lesions rashes or masses  CHEST: CTA bilaterally without use of accessory muscles  CV: Normal S1 S2, RRR, no MRG  ABD: Active bowel sounds, distended, nontender.  EXTREMITIES: without edema, denies calf tenderness  NEURO: CN II - XII grossly intact  CATHETER: LSC Trifusion (Barrat, 08/17/16) - CDI, no erythema      Data:   CBC:   Recent Labs      09/11/16   0335  09/12/16   0330  09/13/16   0443   WBC  2.8*  3.9*  3.7*   HGB  8.5*  8.3*  8.2*   HCT  25.2*  23.8*  23.6*   MCV  85.1  83.6  84.1   PLT  27*  37*  46*     BMP/Mag:  Recent Labs      09/11/16   0335  09/12/16   0330  09/13/16   0443   NA  136  136  129*   K  3.8  3.7  3.5   CL  102  101  94*    CO2  '22  23  24   '$ PHOS  2.2*  1.9*  1.5*   BUN  <2*  3*  5*   CREATININE  0.6*  0.6*  0.6*     LIVP:   Recent Labs      09/11/16   0335   AST  11*   ALT  9*   BILIDIR  <0.2   BILITOT  0.3   ALKPHOS  89     Uric Acid:    Recent Labs      09/13/16   0443   LABURIC  2.9*     Coags:   No results for input(s): PROTIME, INR, APTT in the last 72 hours.  DIAGNOSTICS:   1. CT Thoracic & Lumbar Spine 08/31/16  Thoracic: Diffuse lytic skeletal metastatic disease compatible with  patient's clinical history of multiple myeloma, most pronounced at  the T6 and T10 vertebrae as detailed in body report. Potential  intraspinal involvement by metastatic disease at T6 and T10  levels, but no evidence for significant thecal sac compression on  noncontrast CT.  No fracture or thoracic spine infection.  Small right pleural effusion  Lumbar: Diffuse lytic skeletal metastatic disease compatible with  patient's clinical history of multiple myeloma. No fracture.  No fracture or lumbar spine infection  L5-S1 severe disc degeneration and grade I isthmic  spondylolisthesis contributing to severe bilateral L5 foraminal  stenosis.    PROBLEM LIST: ????????   ????  Multiple myeloma  Hyperlipidemia  HTN  Neoplasm related pain    Post-Transplant Complications:  1. Anorexia  2. Grade 3 Diarrhea  ????  TREATMENT: ????????   ????  1. Rad Tx to T5-7, T10-L3, Right Scapula - 3000 cGy - Dr. Jamas Lav 03/20/16-04/01/16  2. RVD x1 03/20/16 - discontinued d/t rash   3. Velcade/Pomalyst/Dex  x3 cycles 04/23/16-06/17/16 - discontinued d/t reaction to pomalyst  4. Velcade/Dex x 1 cycle 06/26/16 (last dose of dexamethasone 07/22/16  5. High-dose melphalan followed by administration of PBSCs 2.36 x10^6 cd34cells/kg on 08/28/16  ??  ASSESSMENT AND PLAN: ????????   ????  1.  IgG kappa multiple myeloma: PR after 5 cycles chemotherapy - M-spike is 0.4 & BM biopsy 2 % plasma cells  - Admitted for melphalan $RemoveBefor'200mg'POKNmkbLjaMR$ /m2 preparative regimen followed by administration of PBSCs 2.36 x10^6 cd34cells/kg on  08/28/16. He is NOT an early release candidate d/t his lack of social support and high anxiety    Day + 16  ??  2.  ID:  His T-max 100.3.  The only localizing source of infection would be his diarrhea.  I will repeat C. Difficile today.   - Cont Valtrex ppx, Acyclovir at discharge for VZV positive titer.    - Diflucan & Levaquin ppx stopped 09/10/16  ??  3.  Heme:  Pancytopenia from chemotherapy, ANC & plts engrafting  - Transfuse for Hgb < 7 and Platelets < 10K  - No transfusion today  - Granix 09/02/16, last dose today (09/11/16)  ??  4.  Metabolic: HypoCa/HypoPhos,    IVF's D5NS with 22 mmoles K2PO4 at 75.   ??  5. GI/Nutrition:  Mild Malnutrition w/Poor appetite & intake, but attempting to increase. Ongoing diarrhea  - Cont low microbial diet  - Oral intake behind during stay, evident by BUN < 2; needs strong encouragement to eat  - Encourage oral supplements - multiple discussions with pt about attempting oral intake have been had daily  - GERD: Cont PPI  - Diarrhea: CDiff neg 09/02/16 and 09/12/16.  Immodium and Lomotil scheduled  There is no stool studies sent September 13, 2016.  ??  6. Pain: Chronic back & rib pain s/p radtx; acute flare of back pain  - MS contin 30 mg BID; MSIR 15 mg q4h; baclofen 20 tid  - CT 08/31/16: no new pathology  ??  7. 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  8. Chronic Bronchitis/COPD: As evidenced by pre-transplant PFTs - FEV1 is 43%, He did respond to bronchodilators and went up to 52%  - Cont albuterol PRN  - Cont anoro ellipta inhaler  - Cleared for transplant by Dr. Percell Miller  ??  9. Cardiac: h/o HLD. 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 - Cleared for BMT by Dr. Eliane Decree  - HTN: Lisinopril $RemoveBeforeD'10mg'[id number]$  daily, hold today due to low BP's  ??  10. Anxiety/Depression  - Ativan PRN  - Dr. Javier Docker consulted  - Started  Zoloft qhs this admit  ??  11. Insomnia:  - Ativan PRN qHS    12. Debilitation: d/t malnutrition & back pain  - PT/OT signed off.  Pt doing well.      - DVT Prophylaxis: Platelets <50,000 cells/dL - prophylactic lovenox on hold and mechanical prophylaxis with bilateral SCDs while in bed in place.  Contraindications to pharmacologic prophylaxis: Thrombocytopenia  Contraindications to mechanical prophylaxis: None    - Disposition:  No transportation today, so set up d/c for tomorrow; hospital stay may need extended if stool volume increases; he is set up for fluids on Sunday and fluids/MD visit on Monday  ??    Harlene Salts, MD  [facility]  [phone]

## 2016-09-14 ENCOUNTER — Inpatient Hospital Stay: Admit: 2016-09-14 | Primary: Hematology & Oncology

## 2016-09-14 LAB — URINALYSIS
Bilirubin Urine: NEGATIVE
Glucose, Ur: NEGATIVE mg/dL
Leukocyte Esterase, Urine: NEGATIVE
Nitrite, Urine: NEGATIVE
Protein, UA: 30 mg/dL — AB
Specific Gravity, UA: >=1.03 (ref 1.005–1.030)
Urobilinogen, Urine: 0.2 E.U./dL (ref <2.0)
pH, UA: 6 (ref 5.0–8.0)

## 2016-09-14 LAB — HEPATIC FUNCTION PANEL
ALT: 28 U/L (ref 10–40)
AST: 33 U/L (ref 15–37)
Albumin: 2.7 g/dL — ABNORMAL LOW (ref 3.4–5.0)
Alkaline Phosphatase: 101 U/L (ref 40–129)
Bilirubin, Direct: <0.2 mg/dL (ref 0.0–0.3)
Total Bilirubin: 0.3 mg/dL (ref 0.0–1.0)
Total Protein: 5 g/dL — ABNORMAL LOW (ref 6.4–8.2)

## 2016-09-14 LAB — CULTURE, URINE: Urine Culture, Routine: NO GROWTH

## 2016-09-14 LAB — CBC WITH AUTO DIFFERENTIAL
Bands Relative: 7 % (ref 0–7)
Basophils %: 0 %
Basophils Absolute: 0 10*3/uL (ref 0.0–0.2)
Eosinophils %: 0 %
Eosinophils Absolute: 0 10*3/uL (ref 0.0–0.6)
Hematocrit: 22.5 % — ABNORMAL LOW (ref 40.5–52.5)
Hemoglobin: 7.8 g/dL — ABNORMAL LOW (ref 13.5–17.5)
Lymphocytes %: 10 %
Lymphocytes Absolute: 0.4 10*3/uL — ABNORMAL LOW (ref 1.0–5.1)
MCH: 28.8 pg (ref 26.0–34.0)
MCHC: 34.6 g/dL (ref 31.0–36.0)
MCV: 83.4 fL (ref 80.0–100.0)
MPV: 8.9 fL (ref 5.0–10.5)
Metamyelocytes Relative: 1 % — AB
Monocytes %: 27 %
Monocytes Absolute: 1.1 10*3/uL (ref 0.0–1.3)
Myelocyte Percent: 1 % — AB
Neutrophils %: 54 %
Neutrophils Absolute: 2.5 10*3/uL (ref 1.7–7.7)
Platelets: 49 10*3/uL — ABNORMAL LOW (ref 135–450)
RBC: 2.7 M/uL — ABNORMAL LOW (ref 4.20–5.90)
RDW: 13.6 % (ref 12.4–15.4)
WBC: 4 10*3/uL (ref 4.0–11.0)

## 2016-09-14 LAB — URIC ACID: Uric Acid, Serum: 2.5 mg/dL — ABNORMAL LOW (ref 3.5–7.2)

## 2016-09-14 LAB — BASIC METABOLIC PANEL
Anion Gap: 11 (ref 3–16)
BUN: 5 mg/dL — ABNORMAL LOW (ref 7–20)
CO2: 24 mmol/L (ref 21–32)
Calcium: 7.2 mg/dL — ABNORMAL LOW (ref 8.3–10.6)
Chloride: 95 mmol/L — ABNORMAL LOW (ref 99–110)
Creatinine: 0.6 mg/dL — ABNORMAL LOW (ref 0.9–1.3)
GFR African American: >60 (ref >60)
GFR Non-African American: >60 (ref >60)
Glucose: 86 mg/dL (ref 70–99)
Potassium: 3.4 mmol/L — ABNORMAL LOW (ref 3.5–5.1)
Sodium: 130 mmol/L — ABNORMAL LOW (ref 136–145)

## 2016-09-14 LAB — PROTIME-INR
INR: 1.36 — ABNORMAL HIGH (ref 0.85–1.15)
Protime: 15.4 s — ABNORMAL HIGH (ref 9.6–13.0)

## 2016-09-14 LAB — MICROSCOPIC URINALYSIS

## 2016-09-14 LAB — PHOSPHORUS: Phosphorus: 2.8 mg/dL (ref 2.5–4.9)

## 2016-09-14 LAB — CRYPTOSPORIDIUM ANTIGEN, STOOL: Cryptosporidium Ag: NEGATIVE

## 2016-09-14 LAB — MAGNESIUM: Magnesium: 1.4 mg/dL — ABNORMAL LOW (ref 1.80–2.40)

## 2016-09-14 LAB — GIARDIA ANTIGEN: Giardia Ag, Stl: NEGATIVE

## 2016-09-14 LAB — APTT: aPTT: 35.4 s — ABNORMAL HIGH (ref 24.1–34.9)

## 2016-09-14 LAB — CULTURE, THROAT: Throat Culture: NORMAL

## 2016-09-14 LAB — LACTATE DEHYDROGENASE: LD: 174 U/L (ref 100–190)

## 2016-09-14 MED ORDER — POTASSIUM PHOSPHATE 3 MMOL/ML IV SOLN (MIXTURES ONLY)
15 mmol/5 mL | INTRAVENOUS | Status: DC
Start: 2016-09-14 — End: 2016-09-15
  Administered 2016-09-14 – 2016-09-15 (×2): via INTRAVENOUS

## 2016-09-14 MED ORDER — SODIUM CHLORIDE 0.9 % IV SOLN
0.9 % | INTRAVENOUS | Status: AC
Start: 2016-09-14 — End: 2016-09-14
  Administered 2016-09-14: 13:00:00 250 via INTRAVENOUS

## 2016-09-14 MED ORDER — MORPHINE SULFATE ER 15 MG PO TBCR
15 MG | Freq: Two times a day (BID) | ORAL | Status: DC
Start: 2016-09-14 — End: 2016-09-16
  Administered 2016-09-14 – 2016-09-16 (×5): 15 mg via ORAL

## 2016-09-14 MED ORDER — POTASSIUM CHLORIDE CRYS ER 20 MEQ PO TBCR
20 MEQ | Freq: Two times a day (BID) | ORAL | Status: DC
Start: 2016-09-14 — End: 2016-09-15
  Administered 2016-09-14 (×2): 40 meq via ORAL

## 2016-09-14 MED FILL — POTASSIUM CHLORIDE CRYS ER 20 MEQ PO TBCR: 20 MEQ | ORAL | Qty: 2

## 2016-09-14 MED FILL — SODIUM CHLORIDE 0.9 % IV SOLN: 0.9 % | INTRAVENOUS | Qty: 250

## 2016-09-14 MED FILL — BACLOFEN 10 MG PO TABS: 10 MG | ORAL | Qty: 2

## 2016-09-14 MED FILL — PANTOPRAZOLE SODIUM 40 MG PO TBEC: 40 MG | ORAL | Qty: 1

## 2016-09-14 MED FILL — MEROPENEM 1 G IV SOLR: 1 g | INTRAVENOUS | Qty: 1

## 2016-09-14 MED FILL — ORAMORPH SR 30 MG PO TBCR: 30 mg | ORAL | Qty: 1

## 2016-09-14 MED FILL — LORAZEPAM 1 MG PO TABS: 1 MG | ORAL | Qty: 1

## 2016-09-14 MED FILL — SERTRALINE HCL 50 MG PO TABS: 50 MG | ORAL | Qty: 1

## 2016-09-14 MED FILL — CETIRIZINE HCL 10 MG PO TABS: 10 MG | ORAL | Qty: 1

## 2016-09-14 MED FILL — LOPERAMIDE HCL 2 MG PO CAPS: 2 MG | ORAL | Qty: 2

## 2016-09-14 MED FILL — DIPHENOXYLATE-ATROPINE 2.5-0.025 MG PO TABS: ORAL | Qty: 1

## 2016-09-14 MED FILL — MAGNESIUM SULFATE 4 GM/100ML IV SOLN: 4 GM/100ML | INTRAVENOUS | Qty: 100

## 2016-09-14 MED FILL — DEXTROSE-NACL 5-0.9 % IV SOLN: INTRAVENOUS | Qty: 1000

## 2016-09-14 MED FILL — MORPHINE SULFATE 15 MG PO TABS: 15 mg | ORAL | Qty: 1

## 2016-09-14 MED FILL — POTASSIUM CHLORIDE 20 MEQ/50ML IV SOLN: 20 MEQ/50ML | INTRAVENOUS | Qty: 200

## 2016-09-14 MED FILL — MORPHINE SULFATE ER 15 MG PO TBCR: 15 mg | ORAL | Qty: 1

## 2016-09-14 MED FILL — VALACYCLOVIR HCL 500 MG PO TABS: 500 MG | ORAL | Qty: 1

## 2016-09-14 NOTE — Plan of Care (Signed)
Patient noncompliant with using urinal and taking hat out of commode.  Unmeasurable trips to the toilet occurred.  Continue to educate patient on I/O's, patient verbalizes that he understands, but continues to disregard the education.

## 2016-09-14 NOTE — Plan of Care (Signed)
Problem: Infection - Central Venous Catheter-Associated Bloodstream Infection:  Goal: Will show no infection signs and symptoms  Will show no infection signs and symptoms   Outcome: Ongoing  CVC site remains free of signs/symptoms of infection. No drainage, edema, erythema, pain, or warmth noted at site. Dressing changes continue per protocol and on an as needed basis - see flowsheet.         Problem: Pain:  Goal: Pain level will decrease  Pain level will decrease   Outcome: Ongoing  Pt complains of ongoing back pain.  Pt received scheduled mscontin and baclofen.  Pt drowsy from medications.  Pt states he thinks it may be more r/t the baclofen.  Mscontin dose was decreased today per MD.  No prn medications needed.  Will monitor.     Problem: Falls - Risk of:  Goal: Will remain free from falls  Will remain free from falls   Outcome: Ongoing  Orthostatic vital signs obtained at start of shift - see flowsheet for details.  Pt does not meet criteria for orthostasis.  Pt is a Med fall risk. See Lattie Corns Fall Score and ABCDS Injury Risk assessments.   - Screening for Orthostasis AND not a High Falls Risk per MORSE/ABCDS: Pt bed is in low position, side rails up, call light and belongings are in reach.  Fall risk light is on outside pts room.  Pt encouraged to call for assistance as needed. Will continue with hourly rounds for PO intake, pain needs, toileting and repositioning as needed.     Problem: Nausea/Vomiting:  Goal: Absence of nausea/vomiting  Absence of nausea/vomiting   Outcome: Ongoing  No c/o nausea.     Problem: Nutrition Deficit:  Goal: Ability to achieve adequate nutritional intake will improve  Ability to achieve adequate nutritional intake will improve   Outcome: Ongoing  Pt attempting more PO intake.     Problem: Bleeding:  Goal: Will show no signs and symptoms of excessive bleeding  Will show no signs and symptoms of excessive bleeding   Outcome: Ongoing  Patient's hemoglobin this AM:   Recent Labs       09/14/16   0330   HGB  7.8*     Patient's platelet count this AM:   Recent Labs      09/14/16   0330   PLT  49*    Thrombocytopenia Precautions in place.  Patient showing no signs or symptoms of active bleeding.  Transfusion not indicated at this time.  Patient verbalizes understanding of all instructions. Will continue to assess and implement POC. Call light within reach and hourly rounding in place.     Problem: Activity:  Goal: Ability to tolerate increased activity will improve  Ability to tolerate increased activity will improve   Outcome: Ongoing  Pt walked 6 laps in the hallway throughout day.      Problem: Diarrhea:  Goal: Bowel elimination is within specified parameters  Bowel elimination is within specified parameters   Outcome: Ongoing  Pt cont with diarrhea.  Four loose stools today.  Imodium administered as scheduled.

## 2016-09-14 NOTE — Progress Notes (Signed)
[facility]  Autologous Progress Note    09/14/2016    David Watkins    DOB:  [birth year]    MRN:  [id number]    Referring MD: Eulis Canner, [place] [address]    Subjective: Still with abdominal cramping and diarrhea.    ECOG PS:  2    Isolation:  None     Medications    Scheduled Meds:  ??? meropenem  1 g Intravenous Q8H   ??? potassium chloride  20 mEq Oral BID WC   ??? loperamide  4 mg Oral 4 times per day   ??? diphenoxylate-atropine  1 tablet Oral 4 times per day   ??? morphine  30 mg Oral BID   ??? baclofen  20 mg Oral TID   ??? sertraline  50 mg Oral Daily   ??? Saline Mouthwash  15 mL Swish & Spit 4x Daily AC & HS   ??? sodium chloride flush  10 mL Intravenous 2 times per day   ??? valACYclovir  500 mg Oral BID   ??? umeclidinium-vilanterol  1 puff Inhalation Daily   ??? cetirizine  10 mg Oral Daily   ??? pantoprazole  40 mg Oral QAM AC     Continuous Infusions:  ??? IV infusion builder 75 mL/hr at 09/13/16 2333   ??? sodium chloride     ??? sodium chloride       PRN Meds:.acetaminophen, ondansetron, morphine, sodium chloride, sodium chloride, alteplase, magnesium hydroxide, magnesium sulfate, potassium chloride, Saline Mouthwash, prochlorperazine **OR** prochlorperazine, LORazepam **OR** LORazepam, albuterol sulfate HFA, polyethylene glycol, LORazepam    ROS:  ?? Constitutional: Denies fever, sweats, weight loss.  ?? Eyes: No visual changes or diplopia. No scleral icterus.  ?? ENT: No Headaches, hearing loss or vertigo. No mouth sores or sore throat.  ?? Cardiovascular: No chest pain, dyspnea on exertion, palpitations or loss of consciousness.   ?? Respiratory: No cough or wheezing, no sputum production. No hemoptysis.  ?? Gastrointestinal: No abdominal pain, no blood in stools. +Diarrhea.  +Appetite loss  ?? Genitourinary: No dysuria, trouble voiding, or hematuria.  ?? Musculoskeletal:  +generalized weakness & back pain. No joint complaints.  ?? Integumentary: No rash or pruritis.  ?? Neurological: No headache,  diplopia. No change in gait, balance, or coordination. No paresthesias.  ?? Endocrine: No temperature intolerance. No excessive thirst, fluid intake, or urination.   ?? Hematologic/Lymphatic: No abnormal bruising or ecchymoses, blood clots or swollen lymph nodes.  ?? Allergic/Immunologic: No nasal congestion or hives.       Physical Exam:     I&O:      Intake/Output Summary (Last 24 hours) at 09/14/16 0744  Last data filed at 09/14/16 6237   Gross per 24 hour   Intake             2660 ml   Output             1025 ml   Net             1635 ml       Vital Signs:  BP (!) 90/47    Pulse 100    Temp 98.2 ??F (36.8 ??C) (Oral)    Resp 18    Ht 5' 8.74" (1.746 m)    Wt 193 lb 9.6 oz (87.8 kg)    SpO2 92%    BMI 28.81 kg/m??     Weight:    Wt Readings from Last 3  Encounters:   09/13/16 193 lb 9.6 oz (87.8 kg)   08/17/16 192 lb (87.1 kg)   08/10/16 202 lb (91.6 kg)       General: Awake, alert and oriented ??  HEENT: normocephalic, alopecia, PERRL, no scleral erythema or icterus, Oral mucosa moist and intact, throat clear.   NECK: supple without palpable adenopathy  BACK: Straight, negative CVAT  SKIN: warm dry and intact without lesions rashes or masses  CHEST: CTA bilaterally without use of accessory muscles  CV: Normal S1 S2, RRR, no MRG  ABD: Active bowel sounds, distended, nontender.  EXTREMITIES: without edema, denies calf tenderness  NEURO: CN II - XII grossly intact  CATHETER: LSC Trifusion (Barrat, 08/17/16) - CDI, no erythema      Data:   CBC:   Recent Labs      09/12/16   0330  09/13/16   0443  09/14/16   0330   WBC  3.9*  3.7*  4.0   HGB  8.3*  8.2*  7.8*   HCT  23.8*  23.6*  22.5*   MCV  83.6  84.1  83.4   PLT  37*  46*  49*     BMP/Mag:  Recent Labs      09/12/16   0330  09/13/16   0443  09/14/16   0330  09/14/16   0425   NA  136  129*  130*   --    K  3.7  3.5  3.4*   --    CL  101  94*  95*   --    CO2  _0 --    PHOS  1.9*  1.5*  2.8   --    BUN  3*  5*  5*   --    CREATININE  0.6*  0.6*  0.6*   --    MG    --    --    --   1.40*     LIVP:   Recent Labs      09/14/16   0330   AST  33   ALT  28   BILIDIR  <0.2   BILITOT  0.3   ALKPHOS  101     Uric Acid:    Recent Labs      09/14/16   0330   LABURIC  2.5*     Coags:   Recent Labs      09/14/16   0330   PROTIME  15.4*   INR  1.36*   APTT  35.4*     DIAGNOSTICS:   1. CT Thoracic & Lumbar Spine 08/31/16  Thoracic: Diffuse lytic skeletal metastatic disease compatible with  patient's clinical history of multiple myeloma, most pronounced at  the T6 and T10 vertebrae as detailed in body report. Potential  intraspinal involvement by metastatic disease at T6 and T10  levels, but no evidence for significant thecal sac compression on  noncontrast CT.  No fracture or thoracic spine infection.  Small right pleural effusion  Lumbar: Diffuse lytic skeletal metastatic disease compatible with  patient's clinical history of multiple myeloma. No fracture.  No fracture or lumbar spine infection  L5-S1 severe disc degeneration and grade I isthmic  spondylolisthesis contributing to severe bilateral L5 foraminal  stenosis.    PROBLEM LIST: ????????   ????  Multiple myeloma  Hyperlipidemia  HTN  Neoplasm related pain    Post-Transplant Complications:  1. Anorexia  2. Grade  3 Diarrhea  ????  TREATMENT: ????????   ????  1. Rad Tx to T5-7, T10-L3, Right Scapula - 3000 cGy - Dr. Jamas Lav 03/20/16-04/01/16  2. RVD x1 03/20/16 - discontinued d/t rash   3. Velcade/Pomalyst/Dex x3 cycles 04/23/16-06/17/16 - discontinued d/t reaction to pomalyst  4. Velcade/Dex x 1 cycle 06/26/16 (last dose of dexamethasone 07/22/16  5. High-dose melphalan followed by administration of PBSCs 2.36 x10^6 cd34cells/kg on 08/28/16  ??  ASSESSMENT AND PLAN: ????????   ????  1.  IgG kappa multiple myeloma: PR after 5 cycles chemotherapy - M-spike is 0.4 & BM biopsy 2 % plasma cells  - Admitted for melphalan '200mg'$ /m2 preparative regimen followed by administration of PBSCs 2.36 x10^6 cd34cells/kg on 08/28/16. He is NOT an early release candidate d/t his  lack of social support and high anxiety    Day + 17  ??  2.  ID:  His T-max 100.1.  Possible GI source w/ongoing diarrhea  - Cdiff 09/12/16 neg   - Giardia Ag, Rotavirus Ag, Cryptosporidium Ag pending 09/13/16, Fecal leukocytes Positive  - Cont Valtrex ppx, Acyclovir at discharge for VZV positive titer.    - Diflucan & Levaquin ppx stopped 09/10/16  - Merrem Day +3 (started 09/12/16)  ??  3.  Heme:  Anemia & thrombocytopenia from previous chemotherapy  - Transfuse for Hgb < 7 and Platelets < 10K  - No transfusion today  - Granix 09/02/16, last dose (09/11/16)  ??  4.  Metabolic: HypoCa/HypoPhos, other electrolytes stable.  Renal fxn stable  - IVF's D5NS with 22 mmoles KPO4 at 75.   - Klor-Con 41mq BID  ??  5. GI/Nutrition:  Mild Malnutrition w/Poor appetite & intake, but attempting to increase. Ongoing diarrhea  - Cont low microbial diet  - Oral intake behind during stay, evident by BUN < 2; needs strong encouragement to eat  - Encourage oral supplements - multiple discussions with pt about attempting oral intake have been had daily  - GERD: Cont PPI  - Diarrhea, volume improving: CDiff neg 09/02/16 and 09/12/16.  Imodium scheduled.  Lomotil discontinued  - Giardia Ag, Rotavirus Ag, Cryptosporidium Ag pending 09/13/16, Fecal leukocytes Positive    ??  6. Pain: Chronic back & rib pain s/p radtx; acute flare of back pain  - MS contin 30 mg BID; MSIR 15 mg q4h; baclofen 20 tid.  Lower the MS contin to 15 mg BID 1/15 for over sedation and abdominal distention.  - CT 08/31/16: no new pathology  ??  7. 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  8. Chronic Bronchitis/COPD: As evidenced by pre-transplant PFTs - FEV1 is 43%, He did respond to bronchodilators and went up to 52%  - Cont albuterol PRN  - Cont anoro ellipta inhaler  - Cleared for transplant by Dr. MPercell Miller ??  9. Cardiac: h/o HLD. Septal wall defect on echocardiogram   - Lipitor on hold for now; restart after transplant process  - Echo 07/13/16  w/abnormal (paradoxical) septal motion is present with preserved LVEF 55-60%. - Cleared for BMT by Dr. WEliane Decree - HTN: Off his home Lisinopril '10mg'$  daily, hold due to low BP's  ??  10. Anxiety/Depression  - Ativan PRN  - Dr. SJavier Dockerconsulted  - Started Zoloft qhs this admit  ??  11. Insomnia:  - Ativan PRN qHS    12. Debilitation: d/t malnutrition & back pain  - PT/OT signed off.  Pt doing  well.      - DVT Prophylaxis: Platelets <50,000 cells/dL - prophylactic lovenox on hold and mechanical prophylaxis with bilateral SCDs while in bed in place.  Contraindications to pharmacologic prophylaxis: Thrombocytopenia  Contraindications to mechanical prophylaxis: None    - Disposition: Possibly Wednesday if gut better and afebrile.  ??  Melvyn Neth, CNP    Harlene Salts, MD  [facility]  [phone]

## 2016-09-14 NOTE — Care Coordination-Inpatient (Signed)
Type of Admission  Multiple Myeloma IgG  Melphalan Auto ( T:0: 08/28/16)  Day +17        Central venous catheter  Left SC Tri fusion Catheter ( 08/17/16, Dr. Eston Esters rat)        Plan  Proceed with Melphalan Auto SCT for treatment of Multiple Myeloma        Update  09/03/15:  Will begin daily Granix today. Reports having no appetite but still attempting.  09/07/16:  No blood count recovery as yet.  Continues to have loose stools, Lomotil added in addition to Imodium.  09/08/16:  Continue to communicate with brother, Virl Diamond regarding impending discharge. Patient had >1 liter of stool in 24 hours, imodium & lomotil scheduled.  09/11/16:  WBC continues to recover. Continues to have loose stool, states he does not feel he is ready for discharge.   09/14/16:  Febrile over the weekend, discharge held.  Diarrheal stools continue, but volume has decreased. Stool + for fecal leucocytes, giardia, rotavirus & cryptosporidium pending.    Education  08/26/16: Introduced myself in Clinician/Navigator role. Discussed use of cryotherapy with Melphalan, verbalized understanding.  Reviewed timeline of recovery, states that he has a calendar that his brothers & sisters are assisting post discharge, all if his siblings live out of town.  Confirmed local Pharmacy  09/02/16:  Discussed use of Granix  To assist in wbc recovery.  Reinforced timeline for recovery, days 11-14 & criteria for discharge, afebrile, ANC recovering & adequate nutrition.  09/07/16:  Team discussed with patient that should be able to predict discharge date ny Wed, so that family can assist post discharge.  09/07/16:  Spoke with Sister in [place], Dugan Wachtler regarding readiness for home.  Patient has multiple family friends that have scheduled time for Care giver coverage.  09/07/16:  Patient  A lot of encouragement to eat, acknowledges that he has not eaten since yesterday when he ate yogurt.  Stressed that he needs to consider eating like  Scheduled medicine & that adequate nutrition is  essential for discharge.  09/11/16:  Discussed  Impending discharge with patient & [name].  Brother & sister in law coming into to town from [place] today.  Re-assurred patient that he will have close follow up in the the [facility] office.  Reviewed need to maintain food journal for food & hydration.  Discussed re-vaccination in 6 months.  Reviewed low microbial diet with patient & [name].  Given [facility] card & reviewed use.  Discharge  When ANC recovers, afebrile & adequate nutrition  09/14/16:  Plan to discharge by Wed, 1/17 if continues to improve        Pending

## 2016-09-14 NOTE — Plan of Care (Signed)
Problem: Infection - Central Venous Catheter-Associated Bloodstream Infection:  Goal: Will show no infection signs and symptoms  Will show no infection signs and symptoms   Low grade Tm 99.0 F.  Facial flushing, redness.  Patient appears lethargic, but responds appropriately, ambulates with slow steady gate, using IV pole for stability.  Encourage cough and deep breath - walking halls.    Pt stated he bathed during day shift.  RN changed claves and tubing per protocol.      Problem: Pain:  Goal: Pain level will decrease  Pain level will decrease   Continues to complain of lower back pain.  Scheduled MS Contin / Baclofen administered per orders.  PRN Morphine PO given per patient request.  Rating pain 7-8/10.  Reassessments, patient sleeping.    Patient slept through this RN changing caps and tubing.      Problem: Nausea/Vomiting:  Goal: Absence of nausea/vomiting  Absence of nausea/vomiting   No N/V this shift.    Problem: Nutrition Deficit:  Goal: Ability to achieve adequate nutritional intake will improve  Ability to achieve adequate nutritional intake will improve   Decreased appetite - need to encourage oral intake.    Problem: Bleeding:  Goal: Will show no signs and symptoms of excessive bleeding  Will show no signs and symptoms of excessive bleeding   Patient's hemoglobin this AM:   Recent Labs      09/14/16   0330   HGB  7.8*     Patient's platelet count this AM:   Recent Labs      09/14/16   0330   PLT  49*    Thrombocytopenia not present at this time.  Patient showing no signs or symptoms of active bleeding.  Transfusion not indicated at this time.  Patient verbalizes understanding of all instructions. Will continue to assess and implement POC. Call light within reach and hourly rounding in place.     Problem: Venous Thromboembolism:  Goal: Will show no signs or symptoms of venous thromboembolism  Will show no signs or symptoms of venous thromboembolism   Pt wearing DVT boots while in bed.  ENcourage  ambulation.    Problem: Diarrhea:  Goal: Bowel elimination is within specified parameters  Bowel elimination is within specified parameters   Pt continues with diarrhea, loose green, mucous appearance.       Continues taking Lomotil and Imodium, alternating q3 hours.   BS Hyperactive.

## 2016-09-15 LAB — CBC WITH AUTO DIFFERENTIAL
Bands Relative: 5 % (ref 0–7)
Basophils %: 1 %
Basophils Absolute: 0 10*3/uL (ref 0.0–0.2)
Eosinophils %: 0 %
Eosinophils Absolute: 0 10*3/uL (ref 0.0–0.6)
Hematocrit: 23.3 % — ABNORMAL LOW (ref 40.5–52.5)
Hemoglobin: 7.9 g/dL — ABNORMAL LOW (ref 13.5–17.5)
Lymphocytes %: 10 %
Lymphocytes Absolute: 0.4 10*3/uL — ABNORMAL LOW (ref 1.0–5.1)
MCH: 28.5 pg (ref 26.0–34.0)
MCHC: 33.8 g/dL (ref 31.0–36.0)
MCV: 84.4 fL (ref 80.0–100.0)
MPV: 8.7 fL (ref 5.0–10.5)
Metamyelocytes Relative: 2 % — AB
Monocytes %: 36 %
Monocytes Absolute: 1.4 10*3/uL — ABNORMAL HIGH (ref 0.0–1.3)
Neutrophils %: 46 %
Neutrophils Absolute: 2.1 10*3/uL (ref 1.7–7.7)
PLATELET SLIDE REVIEW: DECREASED
Platelets: 57 10*3/uL — ABNORMAL LOW (ref 135–450)
RBC: 2.76 M/uL — ABNORMAL LOW (ref 4.20–5.90)
RDW: 13.6 % (ref 12.4–15.4)
WBC: 3.9 10*3/uL — ABNORMAL LOW (ref 4.0–11.0)

## 2016-09-15 LAB — URIC ACID: Uric Acid, Serum: 2 mg/dL — ABNORMAL LOW (ref 3.5–7.2)

## 2016-09-15 LAB — BASIC METABOLIC PANEL
Anion Gap: 11 (ref 3–16)
BUN: 5 mg/dL — ABNORMAL LOW (ref 7–20)
CO2: 23 mmol/L (ref 21–32)
Calcium: 7.2 mg/dL — ABNORMAL LOW (ref 8.3–10.6)
Chloride: 103 mmol/L (ref 99–110)
Creatinine: <0.5 mg/dL — ABNORMAL LOW (ref 0.9–1.3)
GFR African American: >60 (ref >60)
GFR Non-African American: >60 (ref >60)
Glucose: 105 mg/dL — ABNORMAL HIGH (ref 70–99)
Potassium: 4.8 mmol/L (ref 3.5–5.1)
Sodium: 137 mmol/L (ref 136–145)

## 2016-09-15 LAB — PHOSPHORUS: Phosphorus: 2.5 mg/dL (ref 2.5–4.9)

## 2016-09-15 MED ORDER — POTASSIUM CHLORIDE CRYS ER 20 MEQ PO TBCR
20 MEQ | Freq: Two times a day (BID) | ORAL | Status: DC
Start: 2016-09-15 — End: 2016-09-16
  Administered 2016-09-15 – 2016-09-16 (×3): 20 meq via ORAL

## 2016-09-15 MED ORDER — BACLOFEN 10 MG PO TABS
10 MG | Freq: Every evening | ORAL | Status: DC
Start: 2016-09-15 — End: 2016-09-16
  Administered 2016-09-16: 02:00:00 20 mg via ORAL

## 2016-09-15 MED ORDER — LOPERAMIDE HCL 2 MG PO CAPS
2 MG | Freq: Four times a day (QID) | ORAL | Status: DC | PRN
Start: 2016-09-15 — End: 2016-09-16

## 2016-09-15 MED ORDER — SODIUM CHLORIDE 0.9 % IV SOLN
0.9 % | INTRAVENOUS | Status: AC
Start: 2016-09-15 — End: 2016-09-15
  Administered 2016-09-15: 14:00:00 via INTRAVENOUS

## 2016-09-15 MED FILL — VALACYCLOVIR HCL 500 MG PO TABS: 500 MG | ORAL | Qty: 1

## 2016-09-15 MED FILL — BACLOFEN 10 MG PO TABS: 10 MG | ORAL | Qty: 2

## 2016-09-15 MED FILL — MORPHINE SULFATE ER 15 MG PO TBCR: 15 mg | ORAL | Qty: 1

## 2016-09-15 MED FILL — SERTRALINE HCL 50 MG PO TABS: 50 MG | ORAL | Qty: 1

## 2016-09-15 MED FILL — MEROPENEM 1 G IV SOLR: 1 g | INTRAVENOUS | Qty: 1

## 2016-09-15 MED FILL — MORPHINE SULFATE 15 MG PO TABS: 15 mg | ORAL | Qty: 1

## 2016-09-15 MED FILL — CETIRIZINE HCL 10 MG PO TABS: 10 MG | ORAL | Qty: 1

## 2016-09-15 MED FILL — PANTOPRAZOLE SODIUM 40 MG PO TBEC: 40 MG | ORAL | Qty: 1

## 2016-09-15 MED FILL — LOPERAMIDE HCL 2 MG PO CAPS: 2 MG | ORAL | Qty: 2

## 2016-09-15 MED FILL — DEXTROSE-NACL 5-0.9 % IV SOLN: INTRAVENOUS | Qty: 1000

## 2016-09-15 MED FILL — LORAZEPAM 1 MG PO TABS: 1 MG | ORAL | Qty: 1

## 2016-09-15 MED FILL — STERILE WATER FOR INJECTION IJ SOLN: INTRAMUSCULAR | Qty: 20

## 2016-09-15 MED FILL — POTASSIUM CHLORIDE CRYS ER 20 MEQ PO TBCR: 20 MEQ | ORAL | Qty: 1

## 2016-09-15 MED FILL — SODIUM CHLORIDE 0.9 % IV SOLN: 0.9 % | INTRAVENOUS | Qty: 1000

## 2016-09-15 NOTE — Plan of Care (Addendum)
Problem: Infection - Central Venous Catheter-Associated Bloodstream Infection:  Goal: Will show no infection signs and symptoms  Will show no infection signs and symptoms   Outcome: Ongoing  CVC site remains free of signs/symptoms of infection. No drainage, edema, erythema, pain, or warmth noted at site. Dressing changes continue per protocol and on an as needed basis - see flowsheet.         Problem: PROTECTIVE PRECAUTIONS  Goal: Patient will remain free of nosocomial Infections  Outcome: Ongoing  Pt currently in a private, positive pressure room.  Educated pt on wearing a mask when neutropenic and/or leaving the floor.  No living plants or flowers allowed or visitors under the age of 35.  Also reinforced importance of hand hygiene.  Pt following a low microbial diet.      Problem: Pain:  Goal: Pain level will decrease  Pain level will decrease   Outcome: Ongoing  Pt c/o ongoing back pain.  No worse today.  On sched mscontin.  Baclofen causing drowsiness, NP adjusted order to only receive at night.  Pt tolerating well thus far in shift.      Problem: Falls - Risk of:  Goal: Will remain free from falls  Will remain free from falls   Outcome: Ongoing  Orthostatic vital signs obtained at start of shift - see flowsheet for details.  Pt does not meet criteria for orthostasis.  Pt is a Med fall risk. See Lattie Corns Fall Score and ABCDS Injury Risk assessments.     - Screening for Orthostasis AND not a High Falls Risk per MORSE/ABCDS: Pt bed is in low position, side rails up, call light and belongings are in reach.  Fall risk light is on outside pts room.  Pt encouraged to call for assistance as needed. Will continue with hourly rounds for PO intake, pain needs, toileting and repositioning as needed.     Problem: Nausea/Vomiting:  Goal: Absence of nausea/vomiting  Absence of nausea/vomiting   Outcome: Ongoing  No c/o nausea.     Problem: Nutrition Deficit:  Goal: Ability to achieve adequate nutritional intake will  improve  Ability to achieve adequate nutritional intake will improve   Outcome: Ongoing  Pt making good attempts to increase PO intake.      Problem: Bleeding:  Goal: Will show no signs and symptoms of excessive bleeding  Will show no signs and symptoms of excessive bleeding   Outcome: Ongoing  Patient's hemoglobin this AM:   Recent Labs      09/15/16   0400   HGB  7.9*     Patient's platelet count this AM:   Recent Labs      09/15/16   0400   PLT  57*    Thrombocytopenia Precautions in place.  Patient showing no signs or symptoms of active bleeding.  Transfusion not indicated at this time.  Patient verbalizes understanding of all instructions. Will continue to assess and implement POC. Call light within reach and hourly rounding in place.     Problem: Activity:  Goal: Ability to tolerate increased activity will improve  Ability to tolerate increased activity will improve   Outcome: Ongoing  Pt walking laps in the hallway throughout the day and sitting up in the chair.      Problem: Diarrhea:  Goal: Bowel elimination is within specified parameters  Bowel elimination is within specified parameters   Outcome: Ongoing  Pt states that he has had 5 loose stools this shift, but reports that they seem to  be "firming up".  Denied need for prn Imodium.

## 2016-09-15 NOTE — Progress Notes (Signed)
[facility]  Autologous Progress Note    09/15/2016    RONELL DUFFUS    DOB:  [birth year]    MRN:  [id number]    Referring MD: Eulis Canner[place] [address]    Subjective: Somnolent today - eating better    ECOG PS:  2    Isolation:  None     Medications    Scheduled Meds:  ??? baclofen  20 mg Oral Nightly   ??? potassium chloride  40 mEq Oral BID WC   ??? morphine  15 mg Oral BID   ??? meropenem  1 g Intravenous Q8H   ??? loperamide  4 mg Oral 4 times per day   ??? sertraline  50 mg Oral Daily   ??? Saline Mouthwash  15 mL Swish & Spit 4x Daily AC & HS   ??? sodium chloride flush  10 mL Intravenous 2 times per day   ??? valACYclovir  500 mg Oral BID   ??? umeclidinium-vilanterol  1 puff Inhalation Daily   ??? cetirizine  10 mg Oral Daily   ??? pantoprazole  40 mg Oral QAM AC     Continuous Infusions:  ??? IV infusion builder 75 mL/hr at 09/15/16 0019   ??? sodium chloride     ??? sodium chloride 250 mL (09/14/16 0805)     PRN Meds:.acetaminophen, ondansetron, morphine, sodium chloride, sodium chloride, alteplase, magnesium hydroxide, magnesium sulfate, potassium chloride, Saline Mouthwash, prochlorperazine **OR** [DISCONTINUED] prochlorperazine, LORazepam **OR** [DISCONTINUED] LORazepam, albuterol sulfate HFA, LORazepam    ROS:  ?? Constitutional: Denies fever, sweats, weight loss.  ?? Eyes: No visual changes or diplopia. No scleral icterus.  ?? ENT: No Headaches, hearing loss or vertigo. No mouth sores or sore throat.  ?? Cardiovascular: No chest pain, dyspnea on exertion, palpitations or loss of consciousness.   ?? Respiratory: No cough or wheezing, no sputum production. No hemoptysis.  ?? Gastrointestinal: No abdominal pain, no blood in stools. +Diarrhea.  +Appetite loss  ?? Genitourinary: No dysuria, trouble voiding, or hematuria.  ?? Musculoskeletal:  +generalized weakness & back pain. No joint complaints.  ?? Integumentary: No rash or pruritis.  ?? Neurological: No headache, diplopia. No change in gait, balance, or  coordination. No paresthesias.  ?? Endocrine: No temperature intolerance. No excessive thirst, fluid intake, or urination.   ?? Hematologic/Lymphatic: No abnormal bruising or ecchymoses, blood clots or swollen lymph nodes.  ?? Allergic/Immunologic: No nasal congestion or hives.       Physical Exam:     I&O:      Intake/Output Summary (Last 24 hours) at 09/15/16 0744  Last data filed at 09/15/16 0644   Gross per 24 hour   Intake             3306 ml   Output             1454 ml   Net             1852 ml       Vital Signs:  BP (!) 94/54    Pulse 73    Temp 98.2 ??F (36.8 ??C) (Oral)    Resp 18    Ht 5' 8.74" (1.746 m)    Wt 196 lb 9.6 oz (89.2 kg)    SpO2 93%    BMI 29.25 kg/m??     Weight:    Wt Readings from Last 3 Encounters:   09/14/16 196 lb 9.6 oz (89.2 kg)  08/17/16 192 lb (87.1 kg)   08/10/16 202 lb (91.6 kg)       General: Awake, alert and oriented ??  HEENT: normocephalic, alopecia, PERRL, no scleral erythema or icterus, Oral mucosa moist and intact, throat clear.   NECK: supple without palpable adenopathy  BACK: Straight, negative CVAT  SKIN: warm dry and intact without lesions rashes or masses  CHEST: CTA bilaterally without use of accessory muscles  CV: Normal S1 S2, RRR, no MRG  ABD: Active bowel sounds, distended, nontender.  EXTREMITIES: without edema, denies calf tenderness  NEURO: CN II - XII grossly intact  CATHETER: LSC Trifusion ([place]at, 08/17/16) - CDI, no erythema      Data:   CBC:   Recent Labs      09/13/16   0443  09/14/16   0330  09/15/16   0400   WBC  3.7*  4.0  3.9*   HGB  8.2*  7.8*  7.9*   HCT  23.6*  22.5*  23.3*   MCV  84.1  83.4  84.4   PLT  46*  49*  57*     BMP/Mag:  Recent Labs      09/13/16   0443  09/14/16   0330  09/14/16   0425  09/15/16   0400   NA  129*  130*   --   137   K  3.5  3.4*   --   4.8   CL  94*  95*   --   103   CO2  24  24   --   23   PHOS  1.5*  2.8   --   2.5   BUN  5*  5*   --   5*   CREATININE  0.6*  0.6*   --   <0.5*   MG   --    --   1.40*   --      LIVP:   Recent  Labs      09/14/16   0330   AST  33   ALT  28   BILIDIR  <0.2   BILITOT  0.3   ALKPHOS  101     Uric Acid:    Recent Labs      09/15/16   0400   LABURIC  2.0*     Coags:   Recent Labs      09/14/16   0330   PROTIME  15.4*   INR  1.36*   APTT  35.4*     DIAGNOSTICS:   1. CT Thoracic & Lumbar Spine 08/31/16  Thoracic: Diffuse lytic skeletal metastatic disease compatible with  patient's clinical history of multiple myeloma, most pronounced at  the T6 and T10 vertebrae as detailed in body report. Potential  intraspinal involvement by metastatic disease at T6 and T10  levels, but no evidence for significant thecal sac compression on  noncontrast CT.  No fracture or thoracic spine infection.  Small right pleural effusion  Lumbar: Diffuse lytic skeletal metastatic disease compatible with  patient's clinical history of multiple myeloma. No fracture.  No fracture or lumbar spine infection  L5-S1 severe disc degeneration and grade I isthmic  spondylolisthesis contributing to severe bilateral L5 foraminal  stenosis.    PROBLEM LIST: ????????   ????  Multiple myeloma  Hyperlipidemia  HTN  Neoplasm related pain    Post-Transplant Complications:  1. Anorexia  2. Grade 3 Diarrhea  ????  TREATMENT: ????????   ????  1.  Rad Tx to T5-7, T10-L3, Right Scapula - 3000 cGy - Dr. Jamas Lav 03/20/16-04/01/16  2. RVD x1 03/20/16 - discontinued d/t rash   3. Velcade/Pomalyst/Dex x3 cycles 04/23/16-06/17/16 - discontinued d/t reaction to pomalyst  4. Velcade/Dex x 1 cycle 06/26/16 (last dose of dexamethasone 07/22/16  5. High-dose melphalan followed by administration of PBSCs 2.36 x10^6 cd34cells/kg on 08/28/16  ??  ASSESSMENT AND PLAN: ????????   ????  1.  IgG kappa multiple myeloma: PR after 5 cycles chemotherapy - M-spike is 0.4 & BM biopsy 2 % plasma cells  - S/p melphalan 265m/m2 preparative regimen followed by administration of PBSCs 2.36 x10^6 cd34cells/kg on 08/28/16    Day + 18  ??  2.  ID:  Afebrile.  Possible GI source w/ongoing diarrhea  - Cont Valtrex ppx,  Acyclovir at discharge for VZV positive titer.     - Cdiff 09/12/16 neg   - Giardia Ag neg, Cryptosporidium Ag neg, Rotavirus Ag pending 09/13/16, Fecal leukocytes Positive  - Blood Cxs 09/12/16 NGTD  - Merrem Day +4 (started 09/12/16) - stop today (09/15/16)  ??  3.  Heme:  Anemia & thrombocytopenia from previous chemotherapy  - Transfuse for Hgb < 7 and Platelets < 10K  - No transfusion today  - S/p Granix 09/02/16-09/11/16  ??  4.  Metabolic: HypoCa other electrolytes stable.  Renal fxn stable. Wt increasing  - IVF's NS @ 796mhr, complete current liter then stop  - Klor-Con 2070mBID  ??  5. GI/Nutrition:  Mild Malnutrition appetite & intake improving. Ongoing diarrhea, improving  - Cont low microbial diet  - Oral intake behind during stay, evident by BUN < 2; needs strong encouragement to eat  - Encourage oral supplements - multiple discussions with pt about attempting oral intake have been had daily  - GERD: Cont PPI  - Diarrhea, volume improving: CDiff neg 09/02/16 and 09/12/16.  Imodium scheduled.  Lomotil discontinued  - Stool studies pending (see above)    ??  6. Pain: Chronic back & rib pain s/p radtx; acute flare of back pain  - Decrease MS contin to 1m34mD; MSIR 15 mg q4h; baclofen nightly as he seems over-sedated  - CT 08/31/16: no new pathology  ??  7. 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  8. Chronic Bronchitis/COPD: As evidenced by pre-transplant PFTs - FEV1 is 43%, He did respond to bronchodilators and went up to 52%  - Cont albuterol PRN  - Cont anoro ellipta inhaler  - Cleared for transplant by Dr. MurpPercell Miller  9. Cardiac: h/o HLD. 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 - Cleared for BMT by Dr. WhanEliane DecreeHTN: Off his home Lisinopril 10mg33mly, hold due to low BP's  ??  10. Anxiety/Depression  - Ativan PRN  - Dr. SontaJavier Dockersulted  - Zoloft 50mg 61m ??  11. Insomnia:  - Ativan PRN qHS    12. Debilitation: d/t malnutrition & back pain  - PT/OT signed off.  Pt doing well.      - DVT Prophylaxis: Platelets <50,000 cells/dL - prophylactic lovenox on hold and mechanical prophylaxis with bilateral SCDs while in bed in place.  Contraindications to pharmacologic prophylaxis: Thrombocytopenia  Contraindications to mechanical prophylaxis: None    - Disposition: Wednesday if gut better  and afebrile.  ??    Melvyn Neth, CNP      Ariba Lehnen A. Drucilla Schmidt, DO, MS

## 2016-09-15 NOTE — Plan of Care (Signed)
Problem: Nutrition  Goal: Optimal nutrition therapy  Outcome: Ongoing  Nutrition Problem: Inadequate oral intake  Intervention: Food and/or Nutrient Delivery: Continue current diet, Modify current ONS  Nutritional Goals: pt will tolerate and consume 50% or more of all meals and supplements offered

## 2016-09-15 NOTE — Care Coordination-Inpatient (Signed)
SW has talked with patient today and yesterday as he has been up and walking more since his family came into town.  Brother stated they have been encouraging him to do more so he can be discharged.  Patient has misplaced paperwork that SW took care of last week so Nurse Clinician has been working on getting that together for patient again.  Hopefully patient will be able to discharge home tomorrow.    Freddi Starr, MSW, [place]

## 2016-09-15 NOTE — Progress Notes (Signed)
Nutrition Assessment    Type and Reason for Visit: Reassess    Nutrition Recommendations:   1. Diet: monitor and encourage oral intake small frequent meals throughout the day, antiemetics prior to meals as needed, meds w/bites of food vs empty stomach  2. ONS: changing to Clear Ensure for trial  3. Will continue to monitor po intake, POC and discharge needs for ed    Malnutrition Assessment:   Malnutrition Status: Meets the criteria for moderate malnutrition   Context: Acute illness or injury   Findings of the 6 clinical characteristics of malnutrition (Minimum of 2 out of 6 clinical characteristics is required to make the diagnosis of moderate or severe Protein Calorie Malnutrition based on AND/ASPEN Guidelines):  1. Energy Intake-Less than or equal to 50%, greater than 7 days    2. Weight Loss-5% loss or greater, in 1 month  3. Fat Loss-No significant subcutaneous fat loss,    4. Muscle Loss-Mild muscle mass loss, Thigh (quadriceps)  5. Fluid Accumulation-No significant fluid accumulation,    6. Grip Strength-Not measured    Nutrition Diagnosis:    Problem: Inadequate oral intake   Etiology: related to Catabolic illness    . Signs and symptoms:  as evidenced by Intake 25-50%    Nutrition Assessment:   Subjective Assessment: Pt slowly improving, eating a little better last 2 days, tolerating complex foods and now IV fluids d/c in preperation for discharge in am. Last BM last night, low appetite, but tolerating po, dislikes supplements, has x10 Ensure Enlive in room. Family asking about diet for discharge, nutrition and fluid goals. Reviewed diet ed, calorie/protien/fluid minimum goals.    Nutrition-Focused Physical Findings: BM last night, low appetite, nausea controlled w/meds   Wound Type: None   Current Nutrition Therapies:   Oral Diet Orders: Low Microbial    Oral Diet intake: 26-50%, Select   Oral Nutrition Supplement (ONS) Orders: Standard High Calorie Oral Supplement   ONS intake:  0%   Anthropometric Measures:   Ht: 5' 8.74" (174.6 cm)    Current Body Wt: 203 lb 4.2 oz (92.2 kg)   Admission Body Wt: 201 lb (91.2 kg)   Usual Body Wt: 198 lb (89.8 kg)   % Weight Change: +1% ,  since admit   Ideal Body Wt: 154 lb (69.9 kg),    BMI Classification: BMI 30.0 - 34.9 Obese Class I   Comparative Standards (Estimated Nutrition Needs):   Estimated Daily Total Kcal: 2553-2979 kcal    Estimated Daily Protein (g): 127-170 grams    Estimated Intake vs Estimated Needs: Intake Improving    Nutrition Risk Level: High    Nutrition Interventions:   Continue current diet, Modify current ONS  Continued Inpatient Monitoring, Education Completed, Coordination of Care    Nutrition Evaluation:    Evaluation: Progressing toward goals    Goals: pt will tolerate and consume 50% or more of all meals and supplements offered      Monitoring: Meal Intake, Supplement Intake, Diet Tolerance, Pertinent Labs    See Adult Nutrition Doc Flowsheet for more detail.     Electronically signed by Brynda Peon, RD, LD on 09/15/16 at 2:00 PM    Contact Number: [phone]

## 2016-09-15 NOTE — Other (Signed)
09/15/16 1559   Encounter Summary   Services provided to: Patient and family together   Referral/Consult From: Rounding   Continue Visiting (es 1/16)   Complexity of Encounter Moderate   Length of Encounter 15 minutes   Spiritual/Religious   Type Spiritual support   Assessment Approachable;Hopeful   Intervention Active listening;Prayer   Outcome Receptive;Engaged in conversation

## 2016-09-15 NOTE — Plan of Care (Signed)
Problem: Infection - Central Venous Catheter-Associated Bloodstream Infection:  Goal: Will show no infection signs and symptoms  Will show no infection signs and symptoms   Outcome: Ongoing  CVC site remains free of signs/symptoms of infection. No drainage, edema, erythema, pain, or warmth noted at site. Dressing changes continue per protocol and on an as needed basis - see flowsheet.       Problem: PROTECTIVE PRECAUTIONS  Goal: Patient will remain free of nosocomial Infections  Outcome: Ongoing  Pt with no s/s nosocomial infections noted. Protective precautions in place. Reinforced education regarding hygiene and precautions for visitors. Will continue to monitor      Problem: Pain:  Goal: Pain level will decrease  Pain level will decrease   Outcome: Ongoing  Pt with complaints of back pain during shift. Medicated with PRN MSIR and scheduled MS Contin per [facility] with good response per pt.  Pt denies further needs at this time. Will continue to monitor and implement plan of care.     Problem: Falls - Risk of:  Goal: Will remain free from falls  Will remain free from falls   Outcome: Ongoing  Orthostatic vital signs obtained at start of shift - see flowsheet for details.  Pt does not meet criteria for orthostasis.  Pt is a Medium fall risk. See Lattie Corns Fall Score and ABCDS Injury Risk assessments.   - Screening for Orthostasis AND not a High Falls Risk per MORSE/ABCDS: Pt bed is in low position, side rails up, call light and belongings are in reach.  Fall risk light is on outside pts room.  Pt encouraged to call for assistance as needed. Will continue with hourly rounds for PO intake, pain needs, toileting and repositioning as needed.           Problem: Nausea/Vomiting:  Goal: Absence of nausea/vomiting  Absence of nausea/vomiting   Outcome: Ongoing  Pt with no complaints of nausea throughout shift.  No episodes emesis noted.  Pt tolerated PO medications without issues. Will continue to monitor.     Problem:  Bleeding:  Goal: Will show no signs and symptoms of excessive bleeding  Will show no signs and symptoms of excessive bleeding   Outcome: Ongoing  Patient's hemoglobin this AM:   Recent Labs      09/15/16   0400   HGB  7.9*     Patient's platelet count this AM:   Recent Labs      09/15/16   0400   PLT  57*    Thrombocytopenia Precautions in place.  Patient showing no signs or symptoms of active bleeding.  Transfusion not indicated at this time.  Patient verbalizes understanding of all instructions. Will continue to assess and implement POC. Call light within reach and hourly rounding in place.     Problem: Venous Thromboembolism:  Goal: Will show no signs or symptoms of venous thromboembolism  Will show no signs or symptoms of venous thromboembolism   Outcome: Ongoing  Refusing DVT Prevention: Pt is at risk for DVT d/t decreased mobility and cancer treatment.  Pt educated on importance of activity. Pt has orders for SCDs while in bed, however pt currently refusing treatment.  Reviewed risks of DVT & PE development while inpatient.   Provider aware of patient's refusal and re-education of importance of prophylaxis.  No new orders at this time.  Will continue to re-instruct patient and intervene as appropriate.    Problem: Activity:  Goal: Ability to tolerate increased activity will improve  Ability  to tolerate increased activity will improve   Outcome: Ongoing  Pt walked 6 laps in hallways yesterday.     Problem: Diarrhea:  Goal: Bowel elimination is within specified parameters  Bowel elimination is within specified parameters   Outcome: Ongoing  Pt reports 1 loose BM overnight. See I/O flowsheet. Will continue to monitor.

## 2016-09-16 LAB — CBC WITH AUTO DIFFERENTIAL
Bands Relative: 3 % (ref 0–7)
Basophils %: 0 %
Basophils Absolute: 0 10*3/uL (ref 0.0–0.2)
Eosinophils %: 0 %
Eosinophils Absolute: 0 10*3/uL (ref 0.0–0.6)
Hematocrit: 23.5 % — ABNORMAL LOW (ref 40.5–52.5)
Hemoglobin: 8.2 g/dL — ABNORMAL LOW (ref 13.5–17.5)
Lymphocytes %: 13 %
Lymphocytes Absolute: 0.5 10*3/uL — ABNORMAL LOW (ref 1.0–5.1)
MCH: 28.9 pg (ref 26.0–34.0)
MCHC: 34.8 g/dL (ref 31.0–36.0)
MCV: 83 fL (ref 80.0–100.0)
MPV: 8.5 fL (ref 5.0–10.5)
Metamyelocytes Relative: 1 % — AB
Monocytes %: 28 %
Monocytes Absolute: 1.2 10*3/uL (ref 0.0–1.3)
Neutrophils %: 55 %
Neutrophils Absolute: 2.5 10*3/uL (ref 1.7–7.7)
Platelets: 68 10*3/uL — ABNORMAL LOW (ref 135–450)
RBC: 2.83 M/uL — ABNORMAL LOW (ref 4.20–5.90)
RDW: 13.7 % (ref 12.4–15.4)
WBC: 4.2 10*3/uL (ref 4.0–11.0)

## 2016-09-16 LAB — HEPATIC FUNCTION PANEL
ALT: 26 U/L (ref 10–40)
AST: 28 U/L (ref 15–37)
Albumin: 2.7 g/dL — ABNORMAL LOW (ref 3.4–5.0)
Alkaline Phosphatase: 130 U/L — ABNORMAL HIGH (ref 40–129)
Bilirubin, Direct: <0.2 mg/dL (ref 0.0–0.3)
Total Bilirubin: 0.3 mg/dL (ref 0.0–1.0)
Total Protein: 5.5 g/dL — ABNORMAL LOW (ref 6.4–8.2)

## 2016-09-16 LAB — BASIC METABOLIC PANEL
Anion Gap: 11 (ref 3–16)
BUN: 5 mg/dL — ABNORMAL LOW (ref 7–20)
CO2: 23 mmol/L (ref 21–32)
Calcium: 7.7 mg/dL — ABNORMAL LOW (ref 8.3–10.6)
Chloride: 100 mmol/L (ref 99–110)
Creatinine: <0.5 mg/dL — ABNORMAL LOW (ref 0.9–1.3)
GFR African American: >60 (ref >60)
GFR Non-African American: >60 (ref >60)
Glucose: 89 mg/dL (ref 70–99)
Potassium: 5.1 mmol/L (ref 3.5–5.1)
Sodium: 134 mmol/L — ABNORMAL LOW (ref 136–145)

## 2016-09-16 LAB — ROTAVIRUS ANTIGEN, STOOL: Rotavirus: NEGATIVE

## 2016-09-16 LAB — URIC ACID: Uric Acid, Serum: 2 mg/dL — ABNORMAL LOW (ref 3.5–7.2)

## 2016-09-16 LAB — LACTATE DEHYDROGENASE: LD: 203 U/L — ABNORMAL HIGH (ref 100–190)

## 2016-09-16 LAB — PHOSPHORUS: Phosphorus: 2.3 mg/dL — ABNORMAL LOW (ref 2.5–4.9)

## 2016-09-16 MED ORDER — SERTRALINE HCL 50 MG PO TABS
50 MG | ORAL_TABLET | Freq: Every day | ORAL | 3 refills | Status: DC
Start: 2016-09-16 — End: 2017-03-04

## 2016-09-16 MED ORDER — HEPARIN SOD (PORK) LOCK FLUSH 100 UNIT/ML IV SOLN
100 UNIT/ML | INTRAVENOUS | Status: DC | PRN
Start: 2016-09-16 — End: 2016-09-16
  Administered 2016-09-16: 17:00:00 1500 [IU]

## 2016-09-16 MED ORDER — LOPERAMIDE HCL 2 MG PO CAPS
2 MG | ORAL_CAPSULE | Freq: Four times a day (QID) | ORAL | 1 refills | Status: AC | PRN
Start: 2016-09-16 — End: 2016-09-26

## 2016-09-16 MED ORDER — MORPHINE SULFATE ER 15 MG PO TBCR
15 MG | ORAL_TABLET | Freq: Two times a day (BID) | ORAL | 0 refills | Status: AC
Start: 2016-09-16 — End: 2016-10-16

## 2016-09-16 MED ORDER — MORPHINE SULFATE 15 MG PO TABS
15 MG | ORAL_TABLET | ORAL | 0 refills | Status: AC | PRN
Start: 2016-09-16 — End: 2016-09-23

## 2016-09-16 MED FILL — HEPARIN LOCK FLUSH 100 UNIT/ML IV SOLN: 100 UNIT/ML | INTRAVENOUS | Qty: 15

## 2016-09-16 MED FILL — PANTOPRAZOLE SODIUM 40 MG PO TBEC: 40 MG | ORAL | Qty: 1

## 2016-09-16 MED FILL — MORPHINE SULFATE ER 15 MG PO TBCR: 15 MG | ORAL | Qty: 1

## 2016-09-16 MED FILL — MORPHINE SULFATE 15 MG PO TABS: 15 MG | ORAL | Qty: 1

## 2016-09-16 MED FILL — SERTRALINE HCL 50 MG PO TABS: 50 MG | ORAL | Qty: 1

## 2016-09-16 MED FILL — BACLOFEN 10 MG PO TABS: 10 MG | ORAL | Qty: 2

## 2016-09-16 MED FILL — VALACYCLOVIR HCL 500 MG PO TABS: 500 MG | ORAL | Qty: 1

## 2016-09-16 MED FILL — LORAZEPAM 1 MG PO TABS: 1 MG | ORAL | Qty: 1

## 2016-09-16 MED FILL — CETIRIZINE HCL 10 MG PO TABS: 10 MG | ORAL | Qty: 1

## 2016-09-16 MED FILL — LORAZEPAM 0.5 MG PO TABS: 0.5 MG | ORAL | Qty: 1

## 2016-09-16 MED FILL — MORPHINE SULFATE ER 15 MG PO TBCR: 15 mg | ORAL | Qty: 1

## 2016-09-16 MED FILL — POTASSIUM CHLORIDE CRYS ER 20 MEQ PO TBCR: 20 MEQ | ORAL | Qty: 1

## 2016-09-16 NOTE — Discharge Summary (Signed)
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   Eyes: No visual changes or diplopia. No scleral icterus.   ENT: No Headaches, hearing loss or vertigo. No mouth sores or sore throat.   Cardiovascular: No chest pain, dyspnea on exertion, palpitations or loss of consciousness.    Respiratory: No cough or wheezing, no sputum production. No hemoptysis.   Gastrointestinal: No abdominal pain, no blood in stools. +Diarrhea.  +Appetite loss   Genitourinary: No dysuria, trouble voiding, or hematuria.   Musculoskeletal:  +generalized weakness & back pain. No joint complaints.   Integumentary: No rash or pruritis.   Neurological: No headache, diplopia. No change in gait, balance, or coordination. No paresthesias.   Endocrine: No temperature intolerance. No excessive thirst, fluid intake, or urination.    Hematologic/Lymphatic: No abnormal bruising or ecchymoses, blood clots or swollen lymph nodes.   Allergic/Immunologic: No nasal congestion or hives.         Physical Exam:     I&O:      Intake/Output Summary (Last 24 hours) at 09/16/16 0935  Last data filed at 09/15/16 2114   Gross per 24 hour   Intake             2006 ml   Output                5 ml   Net             2001 ml       Vital Signs:  BP 110/79   Pulse 90   Temp 97.8 F (36.6 C) (Oral)   Resp 16   Ht 5' 8.74" (1.746 m)   Wt 191 lb 1.6 oz (86.7 kg)   SpO2 95%   BMI 28.43 kg/m      Weight:    Wt Readings from Last 3 Encounters:   09/16/16 191 lb 1.6 oz (86.7 kg)   08/17/16 192 lb (87.1 kg)   08/10/16 202 lb (91.6 kg)     General: Awake, alert and oriented   HEENT: normocephalic, alopecia, PERRL, no scleral erythema or icterus, Oral mucosa moist and intact, throat clear.   NECK: supple without palpable adenopathy  BACK: Straight, negative CVAT  SKIN: warm dry and intact without lesions rashes or masses  CHEST: CTA bilaterally without use of accessory muscles  CV: Normal S1 S2, RRR, no MRG  ABD: Active bowel sounds, distended, nontender.  EXTREMITIES: without edema, denies calf tenderness  NEURO: CN II - XII grossly intact  CATHETER: LSC Trifusion (Barrat, 08/17/16) - CDI, no erythema      Data:   CBC:   Recent Labs      09/14/16   0330  09/15/16   0400  09/16/16   0343   WBC  4.0  3.9*  4.2   HGB  7.8*  7.9*  8.2*   HCT  22.5*  23.3*  23.5*   MCV  83.4  84.4  83.0   PLT  49*  57*  68*     BMP/Mag:  Recent Labs      09/14/16   0330  09/14/16   0425  09/15/16   0400  09/16/16   0343   NA  130*   --   137  134*   K  3.4*   --   4.8  5.1   CL  95*   --   103  100   CO2  24   --   23  23   PHOS  2.8   --   2.5  2.3*   BUN  5*   --   5*  5*   CREATININE  0.6*   --   <0.5*  <0.5*   MG   --   1.40*   --    --      LIVP:   Recent Labs      09/14/16   0330  09/16/16   0343   AST  33  28   ALT  28  26   BILIDIR  <0.2  <0.2   BILITOT  0.3  0.3   ALKPHOS  101  130*     Uric Acid:    Recent Labs      09/16/16   0343   LABURIC  2.0*     Coags:   Recent Labs      09/14/16   0330   PROTIME  15.4*   INR  1.36*   APTT  35.4*     DIAGNOSTICS:   1. CT Thoracic & Lumbar Spine 08/31/16  Thoracic: Diffuse lytic skeletal metastatic disease compatible with  patient's clinical history of multiple myeloma, most pronounced at  the T6 and T10 vertebrae as detailed in body report. Potential  intraspinal involvement by metastatic disease at T6 and T10  levels, but no evidence for significant thecal sac compression  on  noncontrast CT.  No fracture or thoracic spine infection.  Small right pleural effusion  Lumbar: Diffuse lytic skeletal metastatic disease compatible with  patient's clinical history of multiple myeloma. No fracture.  No fracture or lumbar spine infection  L5-S1 severe disc degeneration and grade I isthmic  spondylolisthesis contributing to severe bilateral L5 foraminal  stenosis.    PROBLEM LIST:      Multiple myeloma  Hyperlipidemia  HTN  Neoplasm related pain    Post-Transplant Complications:  1. Anorexia  2. Grade 3 Diarrhea    TREATMENT:      1. Rad Tx to T5-7, T10-L3, Right Scapula - 3000 cGy - Dr. Bonita Quin 03/20/16-04/01/16  2. RVD x1 03/20/16 - discontinued d/t rash   3. Velcade/Pomalyst/Dex x3 cycles 04/23/16-06/17/16 - discontinued d/t reaction to pomalyst  4. Velcade/Dex x 1 cycle 06/26/16 (last dose of dexamethasone 07/22/16  5. High-dose melphalan followed by administration of PBSCs 2.36 x10^6 cd34cells/kg on 08/28/16    ASSESSMENT AND PLAN:      1.  IgG kappa multiple myeloma: PR after 5 cycles chemotherapy - M-spike is 0.4 & BM biopsy 2 % plasma cells  - S/p melphalan 200mg /m2 preparative regimen followed by administration of PBSCs 2.36 x10^6 cd34cells/kg on 08/28/16    Day + 21    2.  ID:  Afebrile.  Possible GI source w/ongoing diarrhea  - Cont Valtrex  ppx, Acyclovir at discharge for VZV positive titer.     - Cdiff 09/12/16 neg   - Giardia Ag neg, Cryptosporidium Ag neg  - Blood Cxs 09/12/16 NGTD  - Merrem Day +4 (started 09/12/16) - stopped (09/15/16)    3.  Heme:  Anemia & thrombocytopenia from previous chemotherapy  - Transfuse for Hgb < 7 and Platelets < 10K  - No transfusion today  - S/p Granix 09/02/16-09/11/16    4.  Metabolic: HypoCa other electrolytes stable.  Renal fxn stable. Wt increasing  - IVF's NS @ 49mL/hr generally thru hospital stay  - Klor-Con BID    5. GI/Nutrition:  Mild Malnutrition appetite & intake improving. Ongoing diarrhea, improving  - Cont low  microbial diet  - Oral intake behind during stay, evident by BUN < 2; needs strong encouragement to eat  - Encourage oral supplements - multiple discussions with pt about attempting oral intake have been had daily  - GERD: Cont PPI  - Diarrhea, volume improving: CDiff neg 09/02/16 and 09/12/16.  Imodium scheduled.  Lomotil discontinued  - Stool studies pending (see above)    6. Pain: Chronic back & rib pain s/p radtx; acute flare of back pain  - Decrease MS contin to 15mg  BID; MSIR 15 mg q4h; baclofen - stopped upon discharge  - CT 08/31/16: no new pathology    7. 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 Chronic Bronchitis/COPD: As evidenced by pre-transplant PFTs - FEV1 is 43%, He did respond to bronchodilators and went up to 52%  - Cont albuterol PRN  - Cont anoro ellipta inhaler  - Cleared for transplant by Dr. Eulah Pont    9. Cardiac: h/o HLD. 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 - Cleared for BMT by Dr. Philomena Course  - HTN: Off his home Lisinopril 10mg  daily, hold due to low BP's    10. Anxiety/Depression  - Ativan PRN  - Dr. Loura Pardon consulted  - Zoloft 50mg  qhs    11. Insomnia:  - Ativan PRN qHS    12. Debilitation: d/t malnutrition & back pain  - PT/OT signed off.  Pt doing well.      Disposition:  Home today in stable condition; followup @ [facility] on 09/17/16 for CMP, CBC and possible fluids       Alexiel, Siddall   Home Medication Instructions YNW:[id number]    Printed on:09/16/16 7846   Medication Information                      albuterol sulfate HFA (PROAIR HFA) 108 (90 Base) MCG/ACT inhaler  Inhale 2 puffs into the lungs every 6 hours as needed for Wheezing             atorvastatin (LIPITOR) 10 MG tablet  Take 10 mg by mouth daily             loperamide (IMODIUM) 2 MG capsule  Take 1 capsule by mouth 4 times daily as needed  for Diarrhea             loratadine (CLARITIN) 10 MG capsule  Take 10 mg by mouth daily             LORazepam (ATIVAN) 1 MG tablet  Take 1 mg by mouth nightly as  needed for Anxiety .             morphine (MS CONTIN) 15 MG extended release tablet  Take 1 tablet by mouth 2 times daily for 30 days.             morphine (MSIR) 15 MG tablet  Take 1 tablet by mouth every 4 hours as needed for Pain for up to 7 days. Earliest Fill Date: 09/16/16             omeprazole (PRILOSEC) 20 MG delayed release capsule  Take 20 mg by mouth daily             ondansetron (ZOFRAN) 8 MG tablet  Take 1 tablet by mouth every 8 hours as needed for Nausea or Vomiting             prochlorperazine (COMPAZINE) 10 MG tablet  Take 1 tablet by mouth every 6 hours as needed (nausea)             sertraline (ZOLOFT) 50 MG tablet  Take 1 tablet by mouth daily             umeclidinium-vilanterol (ANORO ELLIPTA) 62.5-25 MCG/INH AEPB inhaler  Inhale 1 puff into the lungs daily             valACYclovir (VALTREX) 500 MG tablet  Take 500 mg by mouth 2 times daily                    [place] Paiz A. Nanci Pina, DO, MS

## 2016-09-16 NOTE — Plan of Care (Signed)
Problem: Infection - Central Venous Catheter-Associated Bloodstream Infection:  Goal: Will show no infection signs and symptoms  Will show no infection signs and symptoms   Outcome: Ongoing  CVC site remains free of signs/symptoms of infection. No drainage, edema, erythema, pain, or warmth noted at site. Dressing changes continue per protocol and on an as needed basis - see flowsheet.     Compliant with [facility] Bath Protocol:  Performed CHG bath today per [facility] protocol utilizing CHG solution in the shower.  CVC site cleansed with CHG wipe over dressing, skin surrounding dressing, and first 6" of IV tubing.  Pt tolerated well.  Continued to encourage daily CHG bathing per [facility] protocol.        Problem: PROTECTIVE PRECAUTIONS  Goal: Patient will remain free of nosocomial Infections  Outcome: Ongoing  RN and staff were compliant throughout the entire shift.    Problem: Nausea/Vomiting:  Goal: Absence of nausea/vomiting  Absence of nausea/vomiting   Outcome: Ongoing  Patient complained on nausea and with one episode of emesis.  Patient denied any medication intervention at this time.  RN instructed the patient to notify the RN if the symptoms continue.  Will continue to monitor.    Problem: Nutrition Deficit:  Goal: Ability to achieve adequate nutritional intake will improve  Ability to achieve adequate nutritional intake will improve   Outcome: Ongoing      Problem: Venous Thromboembolism:  Goal: Will show no signs or symptoms of venous thromboembolism  Will show no signs or symptoms of venous thromboembolism   Outcome: Ongoing  Refusing DVT Prevention: Pt is at risk for DVT d/t decreased mobility and cancer treatment.  Pt educated on importance of activity. Pt has orders for SCDs while in bed, however pt currently refusing treatment.  Reviewed risks of DVT & PE development while inpatient.   Provider aware of patient's refusal and re-education of importance of prophylaxis.  No new orders at this time.  Will continue to  re-instruct patient and intervene as appropriate.    Problem: Activity:  Goal: Ability to tolerate increased activity will improve  Ability to tolerate increased activity will improve   Outcome: Ongoing  Stable/No Isolation Precautions:  Pt with activity orders for up ad lib.  Encouraged pt to be up OOB as much as possible throughout the day and for all meals.  Encouraged frequent short naps as necessary to preserve energy but instructed that while awake, pt should be OOB.    Encouraged pt to ambulate in halls.  Pt was not seen up ad lib in halls  this shift. Pt is visualized to be OOB 1-25% of the time this shift.  Will continue to encourage frequent activity.

## 2016-09-16 NOTE — Plan of Care (Signed)
Problem: Bleeding:  Goal: Will show no signs and symptoms of excessive bleeding  Will show no signs and symptoms of excessive bleeding   Outcome: Ongoing  Patient's hemoglobin this AM:   Recent Labs      09/16/16   0343   HGB  8.2*     Patient's platelet count this AM:   Recent Labs      09/16/16   0343   PLT  68*    Thrombocytopenia Precautions in place.  Patient showing no signs or symptoms of active bleeding.  Transfusion not indicated at this time.  Patient verbalizes understanding of all instructions. Will continue to assess and implement POC. Call light within reach and hourly rounding in place.     Problem: Diarrhea:  Goal: Bowel elimination is within specified parameters  Bowel elimination is within specified parameters   Outcome: Ongoing  No episodes of diarrhea during the shift.  Will continue to monitor.

## 2016-09-16 NOTE — Discharge Instructions (Addendum)
[facility] Discharge Instructions    Call for Questions/Concerns:  [phone] [facility] office  The phone number listed above is available 24 hrs/7 days per week  [facility] is open M-F 8am-4:30pm; Sat-Sun/Holidays 8am-1pm    Symptoms to Report Immediately:     Fever of 100.5 or greater   Vomiting without relief after use of anti-nausea medication   Severe abdominal cramping   Diarrhea: More than 3 loose, watery bowel movements in a 24 hour period   Unusual or excessive bleeding from your mouth, nose, rectum, bladder    Sudden onset of shortness of breath or chest pain   Signs/symptoms of infection: redness, warmth, swelling-particularly to central line site    Report to Physician's office within 24 hours:     Pain not relieved by pain medication   Change in urination-odor, cloudiness, frequency, or pain with urination   Flu-like symptoms   Skin changes-rash, hives, redness or peeling of skin    Additional Instructions:     Avoid people with colds, flu-like symptoms, or any sign of infection   Drink plenty of fluids-attempt to consume 2-3 liters (60-100 ounces) of fluids/24 hour period   Continue low microbial diet until instructed by physician to resume normal diet   Bring all of your medications with you to your doctor's appointments   Bring your current medicine list to each hospital and office visit  Follow up @ [facility] [address] on Thursday, 09/17/16 @ 08:15       You are being discharged with IV access due to need for ongoing therapy.  Below is pertinent information regarding your IV that your next provider may need to know:  Type: Left SC Tri fusion  Surgeon:  Barrat  Plan:continue through recovery  Next dressing change due on: 09/22/16  Cap changes due on: 09/22/16    08/26/2016 12:28 PM  Izora Gala A Levorn Oleski            My Discharge Checklist    Here at the [facility], we want to make sure you have the help you will need once you leave the hospital.  We are  going to go over your discharge instructions with you. We give these to you in writing so you will have a reference if you have questions about symptoms or problems to look for after you leave the hospital.     We know you want to feel better and get home soon. Please answer these questions so we can be sure you have what you need, your questions are answered, and you feel prepared for discharge.    Yes No Do you understand your diagnosis?  Yes No Do you know when and who you need to follow up with?  Yes No Do you feel ready to go home & take care of your daily needs?  Yes No Do you have the help you need at home?  Yes No Do you understand what medications your are taking?  Yes No Do you understand what your medications are for?  Yes No Do you understand what medication side effects to watch for?  Yes No Do you know what symptoms or health problems that require an immediate call to your physician?  Yes No Do you feel ready for discharge?  Yes No Are there any questions that you have re: how to care for yourself at home?  Yes No Do you know about MyChart?    If you have any questions  after you get home, feel free to call the unit and ask to speak with your nurse.  In about 7-10 days you will receive a survey. We value your opinion and hope that you have received care that will enable you to choose the best scores when completing the survey.    It was our pleasure to take care of you,  [facility]           [phone]                                                     Outpatient Infusion [phone]    [facility] Physician Office [place] or Procedural Scheduling [phone] ([phone])

## 2016-09-16 NOTE — Other (Signed)
09/16/16 1002   Encounter Summary   Services provided to: Patient   Referral/Consult From: Rounding   Continue Visiting (es 1/17)   Complexity of Encounter Low   Length of Encounter 15 minutes   Routine   Type Follow up   Assessment Approachable   Intervention Active listening   Outcome Engaged in conversation

## 2016-09-16 NOTE — Progress Notes (Signed)
Reviewed discharge instructions with patient and  caregivers per RN Clinician, N Murrin.  Reviewed discharge medications including dosing, schedule, indication, and adverse reactions.  Reviewed which medications were already taken today and next dosage due for each medication.  Reviewed signs and symptoms that prompt a call to the physician and appropriate phone numbers. Purple ER card given to the patient with explanations of its use.  Reviewed follow up appointments that have been made in [facility] and Outpatient Oncology.  Low microbial diet, activity restrictions, and increased risk of infection were reviewed.     Patient is being discharged with IV access d/t need for ongoing therapy:      Type:  Trifusion                        Plan:continue   Next dressing change due on: 09/22/16   Cap changes due on: 09/20/16  CVC care and maintenance was reviewed with patient and caregiver.  Pt verbalizes understanding of line care and maintenance.      Patient verbalized understanding of all instructions and questions were answered to his. satisfaction.  Signed discharge instructions were given to the patient and a copy placed in the paper-lite chart.  Patient discharged to home per self with caregiver.      Baltazar Apo

## 2016-09-16 NOTE — Care Coordination-Inpatient (Signed)
Type of Admission  Multiple Myeloma IgG  Melphalan Auto ( T:0: 08/28/16)  Day +19      Central venous catheter  Left SC Tri fusion Catheter ( 08/17/16, Dr. Eston Esters rat)        Plan  Proceed with Melphalan Auto SCT for treatment of Multiple Myeloma        Update  09/03/15:  Will begin daily Granix today. Reports having no appetite but still attempting.  09/07/16:  No blood count recovery as yet.  Continues to have loose stools, Lomotil added in addition to Imodium.  09/08/16:  Continue to communicate with brother, Virl Diamond regarding impending discharge. Patient had >1 liter of stool in 24 hours, imodium & lomotil scheduled.  09/11/16:  WBC continues to recover. Continues to have loose stool, states he does not feel he is ready for discharge.   09/14/16:  Febrile over the weekend, discharge held.  Diarrheal stools continue, but volume has decreased. Stool + for fecal leucocytes, giardia, rotavirus & cryptosporidium pending.  09/16/16:  Not requiring G-CSF today, ANC is 2.5. Loose stool output is now minimal.  Needs encouragement to eat, I have given patient a Food Journal for home to helpo quantify for patient & family.    Education  08/26/16: Introduced myself in Clinician/Navigator role. Discussed use of cryotherapy with Melphalan, verbalized understanding.  Reviewed timeline of recovery, states that he has a calendar that his brothers & sisters are assisting post discharge, all if his siblings live out of town.  Confirmed local Pharmacy  09/02/16:  Discussed use of Granix  To assist in wbc recovery.  Reinforced timeline for recovery, days 11-14 & criteria for discharge, afebrile, ANC recovering & adequate nutrition.  09/07/16:  Team discussed with patient that should be able to predict discharge date ny Wed, so that family can assist post discharge.  09/07/16:  Spoke with Sister in [place], Demaris Laverty regarding readiness for home.  Patient has multiple family friends that have scheduled time for Care giver coverage.  09/07/16:  Patient  A  lot of encouragement to eat, acknowledges that he has not eaten since yesterday when he ate yogurt.  Stressed that he needs to consider eating like  Scheduled medicine & that adequate nutrition is essential for discharge.  09/11/16:  Discussed  Impending discharge with patient & [name].  Brother & sister in law coming into to town from [place] today.  Re-assurred patient that he will have close follow up in the the [facility] office.  Reviewed need to maintain food journal for food & hydration.  Discussed re-vaccination in 6 months.  Reviewed low microbial diet with patient & [name].  Given Purple ER card & reviewed use.  09/16/16:Reviewed discharge instructions with patient and brother, Virl Diamond & Sister in Social worker.  Reviewed discharge medications including dosing, schedule, indication, and adverse reactions.  Reviewed which medications were already taken today and next dosage due for each medication.  Aware of prescriptions ready for pick up @ local Pharmacy.    Reviewed signs and symptoms that prompt a call to the physician and appropriate phone numbers. Purple ER card given to the patient with explanations of its use.  Reviewed follow up appointments that have been made in [facility] and Outpatient Oncology.  Low microbial diet, activity restrictions, and increased risk of infection were reviewed. Given Food Journal for patient & family to complete to assist in quantifying intake.  Patient is being discharged with IV access d/t need for ongoing therapy:      Type:  Left SC Tri fusion                    Date of placement:  08/17/16  Surgeon:  Dr. Judithann Sauger  Plan:continue through recovery  Next dressing change due on: 09/22/16  Cap changes due on: 09/22/16  CVC care and maintenance was reviewed with patient and family members.  Pt verbalizes understanding of line care and maintenance.      Patient verbalized understanding of all instructions and questions were answered to his. satisfaction.  Signed discharge instructions were given to the  patient and a copy placed in the paper-lite chart.  Patient discharged to home per self with family members.      Harriett Sine A [facility] Stivers    Discharge  When [facility] recovers, afebrile & adequate nutrition  09/14/16:  Plan to discharge by Wed, 1/17 if continues to improve        Pending

## 2016-09-16 NOTE — Plan of Care (Signed)
Problem: Infection - Central Venous Catheter-Associated Bloodstream Infection:  Goal: Will show no infection signs and symptoms  Will show no infection signs and symptoms   Outcome: Completed Date Met: 09/16/16  No s/s of infection noted. Left Trifusion remains in place, site and dressing c/d/i.     Problem: PROTECTIVE PRECAUTIONS  Goal: Patient will remain free of nosocomial Infections  Outcome: Completed Date Met: 09/16/16      Problem: Pain:  Goal: Pain level will decrease  Pain level will decrease   Outcome: Completed Date Met: 09/16/16  Pt continues with chronic back pain. Scheduled MS contin administered per eMar.  Goal: Control of acute pain  Control of acute pain   Outcome: Completed Date Met: 09/16/16    Goal: Control of chronic pain  Control of chronic pain   Outcome: Completed Date Met: 09/16/16      Problem: Falls - Risk of:  Goal: Will remain free from falls  Will remain free from falls   Outcome: Completed Date Met: 09/16/16  Pt free of falls this shift; up ad lib with steady gait.  Goal: Absence of physical injury  Absence of physical injury   Outcome: Completed Date Met: 09/16/16      Problem: Nausea/Vomiting:  Goal: Absence of nausea/vomiting  Absence of nausea/vomiting   Outcome: Completed Date Met: 09/16/16  No c/o nausea or emesis.    Problem: Nutrition Deficit:  Goal: Ability to achieve adequate nutritional intake will improve  Ability to achieve adequate nutritional intake will improve   Outcome: Completed Date Met: 09/16/16  Pt continues with poor PO intake; caregivers aware to push food and drinks while at home.    Problem: Bleeding:  Goal: Will show no signs and symptoms of excessive bleeding  Will show no signs and symptoms of excessive bleeding   Outcome: Completed Date Met: 09/16/16  No s/s of bleeding noted.    Problem: Venous Thromboembolism:  Goal: Will show no signs or symptoms of venous thromboembolism  Will show no signs or symptoms of venous thromboembolism   Outcome: Completed  Date Met: 09/16/16  No s/s of DVT noted.    Problem: Nutrition  Goal: Optimal nutrition therapy  Outcome: Completed Date Met: 09/16/16      Problem: Activity:  Goal: Ability to tolerate increased activity will improve  Ability to tolerate increased activity will improve   Outcome: Completed Date Met: 09/16/16      Problem: Diarrhea:  Goal: Bowel elimination is within specified parameters  Bowel elimination is within specified parameters   Outcome: Completed Date Met: 09/16/16  Pt continues with diarrhea; C Diff negative 1/13. Pt refusing PRN Imodium.  Goal: Passage of soft, formed stool  Passage of soft, formed stool   Outcome: Completed Date Met: 09/16/16    Goal: Establishment of normal bowel function will improve to within specified parameters  Establishment of normal bowel function will improve to within specified parameters   Outcome: Completed Date Met: 09/16/16

## 2016-09-17 LAB — CULTURE BLOOD #1: Blood Culture, Routine: NO GROWTH

## 2016-09-17 LAB — CULTURE, VRE: VRE Culture: NEGATIVE

## 2016-09-17 LAB — CULTURE, HSV

## 2016-09-17 LAB — CULTURE, BLOOD 2: Culture, Blood 2: NO GROWTH

## 2016-09-18 LAB — EKG 12-LEAD
Atrial Rate: 89 {beats}/min
Diagnosis: NORMAL
P Axis: 85 degrees
P-R Interval: 146 ms
Q-T Interval: 366 ms
QRS Duration: 96 ms
QTc Calculation (Bazett): 445 ms
R Axis: 76 degrees
T Axis: 77 degrees
Ventricular Rate: 89 {beats}/min

## 2016-10-02 ENCOUNTER — Encounter: Primary: Hematology & Oncology

## 2016-10-12 LAB — CULTURE, FUNGUS, BLOOD
Culture, Fungus Blood: NO GROWTH
Culture, Fungus Blood: NO GROWTH

## 2016-10-12 LAB — CULTURE, FUNGUS
Fungus (Mycology) Culture: NO GROWTH
Fungus Stain: NONE SEEN

## 2016-10-16 NOTE — Progress Notes (Signed)
10/16/16 [facility] - Comorbidity Index Addendum  Dr. Hunt Oris re reviewed the comorbidity index and added 1 point for anxiety.   This makes the total comorbidity index score a 5.  Barbie Banner RN BSN  OCN  [place]  Transplant Coordinator  [facility]

## 2016-10-20 NOTE — Telephone Encounter (Signed)
I would like to get the patient in tomorrow 10/21/2016@ 1pm. If he calls back please book that apt.

## 2016-10-20 NOTE — Telephone Encounter (Signed)
Patient scheduled for appointment 2/21 @1 :00

## 2016-10-20 NOTE — Telephone Encounter (Signed)
Left message for return call.

## 2016-10-20 NOTE — Telephone Encounter (Signed)
Returning Meagan's call to schedule appointment to remove line in chest.     Patient requests return call.    [place]  [phone] (home)

## 2016-10-21 ENCOUNTER — Ambulatory Visit
Admit: 2016-10-21 | Discharge: 2016-10-21 | Payer: BLUE CROSS/BLUE SHIELD | Attending: Surgery | Primary: Hematology & Oncology

## 2016-10-21 DIAGNOSIS — C9001 Multiple myeloma in remission: Secondary | ICD-10-CM

## 2016-10-21 NOTE — Progress Notes (Signed)
TUNNELED CATHETER REMOVAL - PROCEDURE NOTE:    Pre/Post Operative diagnosis: Presence of tunneled CVC requiring removal  Surgeon: Leavy Cella, MD  Procedure: Removal of tunneled CVC    Anesthesia: Local  EBL: 5 cc  Complications: None    Indications:  Patient here for left sided tunneled central venous catheter removal, as recommended by patient's oncologist. Most recent oncology note and CBC reviewed. Previous operative note reviewed as necessary. Informed consent obtained. Risks of procedure explained to patient, including but not limited to: bleeding, infection, non wound healing, poor cosmetic result, and need for subsequent procedures.    Procedure Details:  Patient slight head up position in office procedure room. Left neck and upper chest prepped and draped in sterile fashion using chlorhexidine prep solution, including the catheter as it exits the skin. Previously placed sutures were cut. 6 cc of 1% lidocaine used for local anesthetic around the exit site. Curved hemostat used to slowly and methodically dissect the cuff free from subcutaneous tissues. Once cuff was confirmed mobile and free, the catheter was removed during full inhalation. Direct pressure was used to obtain hemostasis. Sterile guaze and Tegaderm used for dressing. All counts correct at end of procedure.    Wound care and discharge instructions were explained to patient, as below. Patient discharged home.    DISCHARGE INSTRUCTIONS:    You may shower in 12 hours. Remove the dressing before showering. Let water run over your incision site. There is no need to scrub it. After showering, pat dry and cover with a small dry guaze and tegaderm dressing (or tape). After about 1 week, a Band-Aid can be used. Avoid tub baths and swimming until the wound has completely healed.    Healing time may vary, but typically takes 2-4 weeks. You may have a small amount of bleeding or drainage for a few days, but this will go away as the wound heals from the  inside out.    Call the office [phone]) right away or come to the emergency department if you experience fever, chills, swelling, decreased appetite, worsening pain, redness, foul drainage, or uncontrolled bleeding from your wound.    Please call the office at [phone] if you have ANY questions or concerns about your treatment.     We encourage your feedback and comments about your experience with [place] and strive to bring you amazing patient care. If you had a negative experience in any way, please inform us so can improve our processes.

## 2016-10-21 NOTE — Patient Instructions (Signed)
DISCHARGE INSTRUCTIONS:    You may shower in 12 hours. Remove the dressing before showering. Let water run over your incision site. There is no need to scrub it. After showering, pat dry and cover with a small dry guaze and tegaderm dressing (or tape). After about 1 week, a Band-Aid can be used. Avoid tub baths and swimming until the wound has completely healed.    Healing time may vary, but typically takes 2-4 weeks. You may have a small amount of bleeding or drainage for a few days, but this will go away as the wound heals from the inside out.    Call the office ([phone]) right away or come to the emergency department if you experience fever, chills, swelling, decreased appetite, worsening pain, redness, foul drainage, or uncontrolled bleeding from your wound.    Please call the office at [phone] if you have ANY questions or concerns about your treatment.     We encourage your feedback and comments about your experience with us and strive to bring you amazing patient care. If you had a negative experience in any way, please inform us so can improve our processes.

## 2016-10-23 ENCOUNTER — Inpatient Hospital Stay: Attending: Family | Primary: Hematology & Oncology

## 2016-10-23 ENCOUNTER — Encounter

## 2016-10-23 ENCOUNTER — Encounter: Admit: 2016-10-23 | Primary: Hematology & Oncology

## 2016-10-23 DIAGNOSIS — C9000 Multiple myeloma not having achieved remission: Secondary | ICD-10-CM

## 2016-11-03 ENCOUNTER — Inpatient Hospital Stay: Admit: 2016-11-03 | Attending: Hematology | Primary: Hematology & Oncology

## 2016-11-03 ENCOUNTER — Encounter

## 2016-11-03 DIAGNOSIS — Z52011 Autologous donor, stem cells: Secondary | ICD-10-CM

## 2016-11-13 ENCOUNTER — Encounter

## 2016-11-13 ENCOUNTER — Inpatient Hospital Stay: Attending: Physician Assistant | Primary: Hematology & Oncology

## 2016-11-13 ENCOUNTER — Encounter: Admit: 2016-11-13 | Primary: Hematology & Oncology

## 2016-11-13 DIAGNOSIS — J189 Pneumonia, unspecified organism: Secondary | ICD-10-CM

## 2016-11-16 NOTE — Progress Notes (Signed)
PRE-OP INSTRUCTIONS FOR THE SURGICAL PATIENT YOU ARE UNABLE TO MAKE CONTACT FOR AN INTERVIEW:  CALLED (H) X2 LMOR     1. Follow instructions for your ARRIVAL TIME as DIRECTED BY YOUR SURGEON.   2. Enter the MAIN entrance located on [address] and report to the desk.   3. Bring your insurance & prescription card and photo ID with you. You may also be asked to pay a co-pay, as you may want to bring a check or credit card with you.   4. Leave all other valuables at home.               5. Arrange for someone to drive you home and be with you for the first 24 hours after discharge.  6. You must contact your surgeon for ALL medication instructions, especially if taking blood thinners, aspirin, or diabetic medication.  7. A Pre-op History and Physical for surgery MUST be completed by your Physician or an Urgent Care within 30 days of your procedure date.  Please bring a copy with you on the day of your procedure and along with any other testing performed.  8. DO NOT EAT OR DRINK ANYTHING AFTER MIDNIGHT, including gum, candy, mints or ice chips   9. Dress in loose, comfortable clothing appropriate for redressing after your procedure. Do not wear jewelry (including body piercings), make-up, fingernail polish, lotion, powders or metal hairclips. Contacts will need to be removed prior to surgery.  10. If you use a CPAP, please bring it with you on the day of your procedure.  11. Do not shave or wax for 72 hours prior to procedure near your operative site  12. FOR WOMAN OF CHILDBEARING AGE ONLY- please bring a urine sample with you on day of surgery or make sure we can collect on arrival.    If you have further questions, you may contact us at [phone]    Left instructions on patient's voicemail.    Alyanna Stoermer.11/16/2016 .10:46 AM      H&P DOS- MRI

## 2016-11-17 ENCOUNTER — Inpatient Hospital Stay: Admit: 2016-11-16 | Attending: Hematology & Oncology | Primary: Hematology & Oncology

## 2016-11-17 DIAGNOSIS — C9000 Multiple myeloma not having achieved remission: Secondary | ICD-10-CM

## 2016-12-11 NOTE — Progress Notes (Signed)
[facility] PRE-SURGICAL TESTING INSTRUCTIONS                          Date of Procedure 12/15/16 Time of Procedure 1:00 p.m.    PRIOR TO PROCEDURE DATE:  1. Please follow any guidelines/instructions prior to your procedure as advised by your surgeon.    2. Arrange for someone to drive you home and be with you for the first 24 hours after discharge for your safety after your procedure for which you received sedation. Ensure it is someone we can share information with regarding your discharge.     3. You must contact your surgeon for instructions IF:  . You are taking any blood thinners, aspirin, anti-inflammatory or vitamin E.  . There is a change in your physical condition such as a cold, fever, rash, cuts, sores or any other infection, especially near your surgical site.    4. Do not drink alcohol the day before or day of your procedure.    5. A Pre-op History and Physical for surgery MUST be completed by your Physician or Urgent Care within 30 days of your procedure date.  Please bring a copy with you on the day of your procedure and along with any other testing performed.     THE DAY OF YOUR PROCEDURE:  1. Follow instructions for ARRIVAL TIME as DIRECTED BY YOUR SURGEON. If your surgeon does not give you a specific arrival time, please arrive at "11:00 a.m."    2. Enter the MAIN entrance from [address] and follow the signs to the free [employer] or Valet parking (offered free of charge 6am-5pm).      3. Enter the Main Entrance of the hospital (do not enter from the lower level of the parking garage). Upon entrance, check in with the receptionist at the main desk on your left.  If no one is available at the desk, proceed into the Surgery Center Waiting Room and go through the door directly into the Surgery Center. There is a TEFL teacher ACROSS from Room 5 (marked with a sign hanging from the ceiling). The phone number for the surgery center is [phone].    4. Please call [phone] option  #2 option #2 if you have not been preregistered yet.  On the day of your procedure bring your insurance card and photo ID. You will be registered at your bedside once brought back to your room.     5. DO NOT EAT OR DRINK ANYTHING AFTER MIDNIGHT.     6. MEDICATIONS   . Take the following medications with a SMALL sip of water: Usual a.m. meds.  . Use your usual dose of inhalers the morning of surgery. BRING your rescue inhaler with you to hospital.   . Anesthesia does NOT want you to take insulin the morning of surgery. They will control your blood sugar while you are at the hospital. Please contact your ordering physician for instructions regarding your insulin the night before your procedure. If you have an insulin pump, please keep it set on basal rate.     7. Do not swallow water when brushing teeth. No gum, candy, mints or ice chips. Refrain from smoking or at least decrease the amount.    8. Dress in loose, comfortable clothing appropriate for redressing after your procedure. Do not wear jewelry (including body piercings), make-up (especially NO eye make-up), fingernail polish (NO toenail polish if foot/leg surgery), lotion, powders or metal  hairclips.    9. Dentures, glasses, or contacts will need to be removed before your procedure. Bring cases for your glasses, contacts, dentures, or hearing aids to protect them while you are in surgery.      10. If you use a CPAP, please bring it with you on the day of your procedure.    11. We recommend that valuable personal  belongings, such as credit cards, cash, cell phones, e-tablets or jewelry, be left at home during your stay. The hospital will not be responsible for valuables that are not secured in the hospital safe. However, if your insurance requires a co-pay, you may want to bring a method of payment, i.e. Check or credit card, if you wish to pay your co-pay the day of surgery.      12. If you are to stay overnight, you may bring a bag with personal items.  Please have any large items you may need brought in by your family after your arrival to your hospital room.    13. If you have a Living Will or Durable Power of Attorney, please bring a copy on the day of your procedure.     14. With your permission, one family member may accompany you while you are being prepared for surgery. Once you are ready, additional family members may join you.    HOW WE KEEP YOU SAFE and WORK TO PREVENT SURGICAL SITE INFECTIONS:  1. Health care workers should always check your ID bracelet to verify your name and birth date. You will be asked many times to state your name, date of birth, and allergies.    2. Health care workers should always clean their hands with soap or alcohol gel before providing care to you. It is okay to ask anyone if they cleaned their hands before they touch you.    3. You will be actively involved in verifying the type of procedure you are having and ensuring the correct surgical site. This will be confirmed multiple times prior to your procedure. Do NOT mark your surgery site UNLESS instructed to by your surgeon.     4. Do not shave or wax for 72 hours prior to procedure near your operative site. Shaving with a razor can irritate your skin and make it easier to develop an infection. On the day of your procedure, any hair that needs to be removed near the surgical site will be 'clipped' by a healthcare worker using a special clippers designed to avoid skin irritation.    5. When you are in the operating room, your surgical site will be cleansed with a special soap, and in most cases, you will be given an antibiotic before the surgery begins.      AFTER YOUR PROCEDURE:  1. For comfort and safety, arrange to have someone at home with you for the first 24 hours after discharge.    2. You and your family will be given written instructions about your diet, activity, dressing care, medications, and return visits.     3. Always clean your hands before and after caring for  your wound. Do not let your family touch your surgery site without cleaning their hands.     4. Mild nausea, headache, muscle aches, sore throat, or fatigue may occur after anesthesia. Should any of these symptoms become severe, or should you notice any signs of infection, you should call your surgeon.    5. Narcotic pain medications can cause significant constipation.  You may want  to add a stool softener to your postoperative medication schedule or speak to your surgeon on how best to manage this SIDE EFFECT.    SPECIAL INSTRUCTIONS     Thank you for allowing [place] to care for you.  We strive to exceed your expectations in the delivery of care and service provided to you and your family.     If you need to contact us for any reason, please call us at [phone]    Instructions reviewed with patient during preadmission testing phone interview.  Jannifer Hick.12/11/2016 .3:36 PM      ADDITIONAL EDUCATIONAL INFORMATION REVIEWED PER PHONE WITH YOU AND/OR YOUR FAMILY:  No Bring a urine sample on day of surgery  No Pain Goal-Taking Control of Your Pain  No FAQs about Surgical Site Infections  No Hibiclens Bathing Instructions   No Antibacterial Soap  No Sage Wipes Bathing Instructions (Obtained from: [email or web address] )  No Biomedical engineer

## 2016-12-11 NOTE — Progress Notes (Signed)
The following educational items and goals will be achieved upon completion of the patient's Pre-admission testing experience:             Identify the learner who is being assessed for education:  patient                    Ability to Learn:  Exhibits ability to grasp concepts and respond to questions: High  Ready to Learn: Yes  calm   Preferred Method of Learning:  written  Barriers to Learning: Verbalizes interest  Special Considerations due to cultural, religious, spiritual beliefs:  No  Language:  English  Language Interpreter:  No    [facility] Perioperative Care Plan Goals  [x]  Appropriate evaluation / integration of data as delineated by ASPAN Standards of Perianesthesia Nursing Practice    Pain scale and pain management   [x] Patient verbalizes understanding of pain scale and pain management  [x] Pre-operative determination of patient's anticipated Post-Operative pain goal:   less than 4 of 10 on 10 point scale post op goal  []  Other     Medication(s) - Compliance with preop medication instructions  [x]  Patient verbalizes understanding of preop medications (see [facility] Presurgical Instructions)    Instructions, Pre op                                                                                            [x]  Patient verbalizes understanding of presurgical instructions as reviewed with phone interview nurse or in-person nurse review    Fall Risk Potential, Preoperatively                                                                                   [] No preoperative risk identified  [] Preop risk identified:                    [] Sensory deficit        [] Motor deficit        [] Balance problem        [] Home medication        [] Uses assistive device                    [] History of a Fall within the last 30 days    Goal(s) for fall prevention:  [] Prevent fall or injury by requesting assistance with activities of daily living  [] Patient / Significant other verbalizes understanding the need to call  for assistance prior to getting out of bed during hospitalization      Infection Precautions                                                                                            [  x] Patient understands implementation of Surgical Site Infection precautions (see [facility] Presurgical Instructions)     Patient Safety  [x]  Patient identification verified  [x]  Site verified    Instructions - Discharge Planning for Outpatients  [x]  Patient / significant other voices understanding of home care and follow up procedures  [x]  Encourage patient / significant other to review discharge instructions the day after procedure due to sedation on day of surgery    Anticipated Special Needs upon discharge:        []  Cooling device        []  Crutches       []  Walker        []  Wound Support device        []  Drain        []  Other       Instructions - Discharge Planning for Admitted patients  []  Patient / significant other understands plan for admission after surgery  [x]  Patient / significant other understands plan for anticipated discharge dispostion        12/11/2016 3:38 PM Jannifer Hick

## 2016-12-15 ENCOUNTER — Encounter

## 2016-12-15 ENCOUNTER — Encounter: Admit: 2016-12-15 | Primary: Hematology & Oncology

## 2016-12-15 ENCOUNTER — Ambulatory Visit: Admit: 2016-12-15 | Primary: Hematology & Oncology

## 2016-12-15 ENCOUNTER — Inpatient Hospital Stay: Admit: 2016-12-11 | Attending: Hematology & Oncology | Primary: Hematology & Oncology

## 2016-12-15 DIAGNOSIS — C9000 Multiple myeloma not having achieved remission: Secondary | ICD-10-CM

## 2016-12-15 MED ORDER — ONDANSETRON HCL 4 MG/2ML IJ SOLN
4 | Freq: Once | INTRAMUSCULAR | Status: AC | PRN
Start: 2016-12-15 — End: 2016-12-15

## 2016-12-15 MED ORDER — MIDAZOLAM HCL 2 MG/2ML IJ SOLN
2 | INTRAMUSCULAR | Status: AC
Start: 2016-12-15 — End: 2016-12-15

## 2016-12-15 MED ORDER — OXYCODONE-ACETAMINOPHEN 5-325 MG PO TABS
5-325 | Freq: Once | ORAL | Status: DC
Start: 2016-12-15 — End: 2016-12-16

## 2016-12-15 MED ORDER — LACTATED RINGERS IV SOLN
INTRAVENOUS | Status: DC
Start: 2016-12-15 — End: 2016-12-16
  Administered 2016-12-15: 15:00:00 via INTRAVENOUS

## 2016-12-15 MED ORDER — HYDRALAZINE HCL 20 MG/ML IJ SOLN
20 | INTRAMUSCULAR | Status: DC | PRN
Start: 2016-12-15 — End: 2016-12-16

## 2016-12-15 MED ORDER — MEPERIDINE HCL 25 MG/ML IJ SOLN
25 | INTRAMUSCULAR | Status: DC | PRN
Start: 2016-12-15 — End: 2016-12-16

## 2016-12-15 MED ORDER — LABETALOL HCL 5 MG/ML IV SOLN
5 | INTRAVENOUS | Status: DC | PRN
Start: 2016-12-15 — End: 2016-12-16

## 2016-12-15 MED ORDER — FENTANYL CITRATE (PF) 100 MCG/2ML IJ SOLN
100 MCG/2ML | INTRAMUSCULAR | Status: AC
Start: 2016-12-15 — End: 2016-12-15

## 2016-12-15 MED ORDER — HYDROMORPHONE HCL 2 MG/ML IJ SOLN
2 | INTRAMUSCULAR | Status: DC | PRN
Start: 2016-12-15 — End: 2016-12-16

## 2016-12-15 MED ORDER — PROMETHAZINE HCL 25 MG/ML IJ SOLN
25 | Freq: Once | INTRAMUSCULAR | Status: AC | PRN
Start: 2016-12-15 — End: 2016-12-15

## 2016-12-15 MED ORDER — DIPHENHYDRAMINE HCL 50 MG/ML IJ SOLN
50 | Freq: Once | INTRAMUSCULAR | Status: AC | PRN
Start: 2016-12-15 — End: 2016-12-15

## 2016-12-15 MED ORDER — FENTANYL CITRATE (PF) 100 MCG/2ML IJ SOLN
100 | INTRAMUSCULAR | Status: AC
Start: 2016-12-15 — End: 2016-12-15

## 2016-12-15 MED ORDER — GADOTERIDOL 279.3 MG/ML IV SOLN
279.3 MG/ML | Freq: Once | INTRAVENOUS | Status: AC | PRN
Start: 2016-12-15 — End: 2016-12-15
  Administered 2016-12-15: 19:00:00 20 mL via INTRAVENOUS

## 2016-12-15 MED ORDER — FENTANYL CITRATE (PF) 100 MCG/2ML IJ SOLN
100 MCG/2ML | Freq: Once | INTRAMUSCULAR | Status: AC
Start: 2016-12-15 — End: 2016-12-15
  Administered 2016-12-15: 16:00:00 100 ug via INTRAVENOUS

## 2016-12-15 MED ORDER — FENTANYL CITRATE (PF) 100 MCG/2ML IJ SOLN
100 | INTRAMUSCULAR | Status: DC | PRN
Start: 2016-12-15 — End: 2016-12-16

## 2016-12-15 MED FILL — PROHANCE 279.3 MG/ML IV SOLN: 279.3 MG/ML | INTRAVENOUS | Qty: 20

## 2016-12-15 MED FILL — LACTATED RINGERS IV SOLN: INTRAVENOUS | Qty: 1000

## 2016-12-15 MED FILL — FENTANYL CITRATE (PF) 100 MCG/2ML IJ SOLN: 100 MCG/2ML | INTRAMUSCULAR | Qty: 2

## 2016-12-15 MED FILL — MIDAZOLAM HCL 2 MG/2ML IJ SOLN: 2 MG/ML | INTRAMUSCULAR | Qty: 2

## 2016-12-15 NOTE — Discharge Instructions (Signed)
[place] may be drowsy or lightheaded after receiving sedation or anesthesia. Do not operate any vehicles (automobiles, bicycles, motorcycles) or power tools or machinery for 24 hours.  Do not sign any legal documents or make any legal decisions for 24 hours. Do not drink alcohol for 24 hours or while taking narcotic pain medication.  A responsible person should be with you for the next 24 hours.    Please follow the instructions checked below:      DIET INSTRUCTIONS:  [x] Start with light diet and progress to your normal diet as you feel like eating. If you experience nausea or repeated episodes of vomiting which persist beyond 12-24 hours, notify your doctor.             MEDICATION INSTRUCTIONS:  [] Prescription(S) x     sent with you.  Use as directed.  When taking pain medications, you may experience the side effect of dizziness or drowsiness.  Do not drink alcohol or drive when taking these medications.  [] Prescription(S) x          Called to Pharmacy Name and location:    [x] Give the list of your medications to your primary care physician on your next visit. Keep your med list updated and carry it with in case of emergencies.    []  Narcotic pain medications can cause the side effect of significant constipation.  You may want to add a stool softener to your postoperative medication schedule or speak to your surgeon on how best to manage this side effect.    If you have a CPAP machine, it is very important that you use it daily during all periods of sleep and daytime rest during your recovery at home.  Surgery and Anesthesia place a significant amount of stress on your body.  Using your CPAP will help keep you safe and lessen the negative effects of that stress.  DO NOT DRIVE FOR 24 HOURS AFTER RECEIVING ANESTHESIA      FOLLOW-UP RECOVERY CARE:  [x] Call FOLLOW UP WITH dR. eSSEL AS SCHEDULED 12/16/2016    Watch for these possible complications or symptoms.   Call physician if they or any other problems occur:  - Signs of INFECTION   > Fever over 101     > Redness, swelling, hardness or warmth at the operative site   >Foul smelling or cloudy drainage at the operative site   - Unrelieved PAIN  - Unrelieved NAUSEA  -       Physician:  dR. eSSEL    The above instructions were reviewed with patient/significant other.  The following additional patient specific information was reviewed with the patient/significant other:  [] Procedure/physician specific instructions  [] Medication information sheet(S)  [] Dionne'S egress test  [] Pain Ball management  [] FAQ Catheter associated blood stream infections  [] FAQ Surgical Site Infections  [] Other-    I have read and understand the instructions given to me: ____________________________________________   (Patient/S.O. Signature)            Date/time 12/15/2016 3:43 PM         PACU:  [phone]   M-F 700 AM - 7 PM      SAME DAY SERVICES:  [phone] M-F 7AM-6PM        If you smoke STOP. We care about your health!

## 2016-12-15 NOTE — Anesthesia Pre-Procedure Evaluation (Addendum)
ANESTHESIA PRE-OPERATIVE EVALUATION    Patient Name: David Watkins Date of Birth: 12-28-1955   Sex: Male Age: 61 yrs     Medical Record Number: [id number] Acct Number: [id number]     Date of Procedure: 12/15/2016 Time of Procedure: 1300     Preoperative Diagnosis: Multiple myeloma not having achieved remission ([place]) [C90.00]   Procedure: MRI thoracic spine   Surgeon: Harlene Salts, MD    VITAL SIGNS   Vitals:    12/15/16 1206   Pulse: 73   Resp: 20   SpO2: 93%      Height Height: 5' 9"  (175.3 cm)   Weight Weight: 179 lb (81.2 kg)   BMI Body mass index is 26.43 kg/m??.          Allergies Allergies   Allergen Reactions   ??? Pomalidomide Hives   ??? Ceclor [Cefaclor] Hives   ??? Lenalidomide Rash      NPO Status >8 hours   IV Access     Isolation No active isolations     MEDICAL HISTORY   Past Medical History       Diagnosis Date   ??? Bone pain    ??? Hyperlipidemia    ??? Low back pain    ??? Multiple myeloma ([place])    ??? Neuropathy (HCC)     chemo induced, feet      Exercise Tolerance   Patient is able to walk up a flight of stairs or 1-2 blocks without chest pain or shortness of breath? Yes   Anesthetic History   Personal History of Problems: No Family History of Problems: No   Surgical History    has a past surgical history that includes bone marrow biopsy; other surgical history (Left, 08/17/2016); and bone marrow transplant.   Social History   Social History     Occupational History   ??? Not on file.     Social History Main Topics   ??? Smoking status: Former Smoker     Quit date: 11/25/2012   ??? Smokeless tobacco: Never Used   ??? Alcohol use No   ??? Drug use: No   ??? Sexual activity: Not on file        The medical history was obtained from the patient & the medical records. The nursing notes, primary physician's notes, and old charts (if applicable) were reviewed.    LABS   Basic Metabolic Profile Lab Results   Component Value Date    NA 134 09/16/2016    K 5.1 09/16/2016    CL 100 09/16/2016    CO2 23 09/16/2016    BUN 5 09/16/2016     CREATININE <0.5 09/16/2016    GLUCOSE 89 09/16/2016    GLUCOSE 110 04/24/2016    PHOS 2.3 09/16/2016    MG 1.40 09/14/2016      Complete Blood Count Lab Results   Component Value Date    WBC 4.2 09/16/2016    HGB 8.2 (L) 09/16/2016    HGB 7.9 (L) 09/15/2016    HCT 23.5 (L) 09/16/2016    HCT 23.3 (L) 09/15/2016    PLT 68 (L) 09/16/2016      Coagulation Studies Lab Results   Component Value Date    INR 1.36 (H) 09/14/2016    INR 1.28 (H) 09/10/2016      Blood Type O POS   STUDIES   EKG    Echo      MEDICATIONS   Home Medications   Patient's Medications  New Prescriptions    No medications on file   Previous Medications    ALBUTEROL SULFATE HFA (PROAIR HFA) 108 (90 BASE) MCG/ACT INHALER    Inhale 2 puffs into the lungs every 6 hours as needed for Wheezing    ATORVASTATIN (LIPITOR) 10 MG TABLET    Take 10 mg by mouth daily    GABAPENTIN (NEURONTIN) 300 MG CAPSULE    Take 300 mg by mouth 3 times daily..    MORPHINE (KADIAN) 10 MG EXTENDED RELEASE CAPSULE    Take 10 mg by mouth 2 times daily..    MORPHINE (MSIR) 15 MG TABLET    Take 15 mg by mouth daily..    SERTRALINE (ZOLOFT) 50 MG TABLET    Take 1 tablet by mouth daily    UMECLIDINIUM-VILANTEROL (ANORO ELLIPTA) 62.5-25 MCG/INH AEPB INHALER    Inhale 1 puff into the lungs daily    VALACYCLOVIR (VALTREX) 500 MG TABLET    Take 500 mg by mouth 2 times daily   Modified Medications    No medications on file   Discontinued Medications    LORATADINE (CLARITIN) 10 MG CAPSULE    Take 10 mg by mouth daily as needed     LORAZEPAM (ATIVAN) 1 MG TABLET    Take 1 mg by mouth nightly as needed for Anxiety .    OMEPRAZOLE (PRILOSEC) 20 MG DELAYED RELEASE CAPSULE    Take 20 mg by mouth daily    ONDANSETRON (ZOFRAN) 8 MG TABLET    Take 1 tablet by mouth every 8 hours as needed for Nausea or Vomiting    PROCHLORPERAZINE (COMPAZINE) 10 MG TABLET    Take 1 tablet by mouth every 6 hours as needed (nausea)      Medications taken on the morning of surgery: anoro-ellipta     PHYSICAL  EXAMINATION     Airway Physical    Mallampati: 2 HEENT: normal   Dentition: cap Neuro: appears well, normal, alert & oriented x 3   Mouth Opening: adequate CVS: regular rate & rhythm. No murmurs, clicks, or gallops. No. JVD.   Prominent Incisors: no Respiratory:  CTAB, no wheezes, rales or rhonchi   Thyromental Distance: >3 cm Abdomen: soft, nontender, non-distended. No guarding or rebound. Normal bowel sounds.   Head & Neck Range of Motion: full Extremities: no peripheral edema     No changes in H & P.   ANESTHETIC PLAN     ASA Status: 2 Anesthesia Type: general LMA   Difficult Airway: No PONV History: No   Full Stomach: No Nerve Block: No   Code Status: Prior Advanced Directives: Not Received       Notes:        The anesthetic plan was discussed with the patient and/or patient representative.  All questions were answered.  Risks and possible complications were explained.       Complications include, but are not limited to, sore throat, dental injury or damage, trauma to soft tissue,  eye injury, corneal abrasion, adverse reaction to blood products and/or medications, bleeding, nerve  injury, cardiac or respiratory arrest, and death.  The patient and patient???s family understands these complications and are agreeable to the anesthetic plan. Nursing staff present in room during this   discussion and consent.     Informed consent obtained verbally from the patient.    Electronically signed by Suzi Roots on 12/15/2016

## 2016-12-15 NOTE — Progress Notes (Signed)
Pt arrived from MRI, sleepy but awakens easily, report received from CRNA

## 2016-12-15 NOTE — Progress Notes (Signed)
Ambulatory Surgery/Procedure Discharge Note    Vitals:    12/15/16 1600   BP: 122/78   Pulse: 75   Resp: 16   Temp: 97.6 ??F (36.4 ??C)   SpO2: 99%       In: 1150 [I.V.:1150]  Out: -     Pain assessment:  None, no pain from procedure, no nausea  Pain Level: 0    Patient discharged to home/self care. Patient discharged via wheel chair by transporter to waiting family/S.O.       12/15/2016 4:21 PM

## 2016-12-15 NOTE — Progress Notes (Signed)
Dr Bradd Canary aware he needs an update on H&P. Made aware of last visit to Dr Lynnette Caffey. She said she would be in to see him.

## 2016-12-15 NOTE — Progress Notes (Signed)
Dr Charlesetta Ivory in to update his H&P. She said he can have his MRI  with sedation by anesthesia.

## 2016-12-15 NOTE — Progress Notes (Signed)
PACU Transfer to SDS    Vitals:    12/15/16 1550   BP: 110/66   Pulse: 79   Resp: 10   Temp: 98.4 ??F (36.9 ??C)   SpO2: 98%         Intake/Output Summary (Last 24 hours) at 12/15/16 1602  Last data filed at 12/15/16 1550   Gross per 24 hour   Intake             1150 ml   Output                0 ml   Net             1150 ml       Pain assessment:    Pain Level: 0    Patient transferred to care of SDS RN.    12/15/2016 4:02 PM

## 2016-12-16 MED FILL — LIDOCAINE HCL (CARDIAC) 20 MG/ML IV SOLN: 20 MG/ML | INTRAVENOUS | Qty: 5

## 2016-12-16 MED FILL — GLYCOPYRROLATE 0.4 MG/2ML IJ SOLN: 0.4 MG/2ML | INTRAMUSCULAR | Qty: 2

## 2016-12-16 MED FILL — LACTATED RINGERS IV SOLN: INTRAVENOUS | Qty: 1000

## 2016-12-16 MED FILL — PROPOFOL 200 MG/20ML IV EMUL: 200 MG/20ML | INTRAVENOUS | Qty: 20

## 2017-01-24 ENCOUNTER — Inpatient Hospital Stay
Admit: 2017-01-24 | Discharge: 2017-01-25 | Disposition: A | Discharge status: Home / Self Care | Attending: Emergency Medicine

## 2017-01-24 DIAGNOSIS — R197 Diarrhea, unspecified: Secondary | ICD-10-CM

## 2017-01-24 LAB — POCT VENOUS
CO2: 25 mmol/L (ref 21–32)
Calcium, Ionized: 1.22 mmol/L (ref 1.12–1.32)
GFR African American: >60
GFR Non-African American: >60 (ref >60)
POC Anion Gap: 12 (ref 10–20)
POC BUN: 9 mg/dL (ref 7–18)
POC Chloride: 104 mmol/L (ref 99–110)
POC Creatinine: 1 mg/dL (ref 0.9–1.3)
POC Glucose: 92 mg/dl (ref 70–99)
POC Potassium: 4.2 mmol/L (ref 3.5–5.1)
POC Sodium: 141 mmol/L (ref 136–145)

## 2017-01-24 LAB — CBC WITH AUTO DIFFERENTIAL
Basophils %: 0.6 %
Basophils Absolute: 0 10*3/uL (ref 0.0–0.2)
Eosinophils %: 3.9 %
Eosinophils Absolute: 0.3 10*3/uL (ref 0.0–0.6)
Hematocrit: 39 % — ABNORMAL LOW (ref 40.5–52.5)
Hemoglobin: 12.8 g/dL — ABNORMAL LOW (ref 13.5–17.5)
Lymphocytes %: 17.2 %
Lymphocytes Absolute: 1.3 10*3/uL (ref 1.0–5.1)
MCH: 28.8 pg (ref 26.0–34.0)
MCHC: 32.7 g/dL (ref 31.0–36.0)
MCV: 88 fL (ref 80.0–100.0)
MPV: 6.6 fL (ref 5.0–10.5)
Monocytes %: 20.6 %
Monocytes Absolute: 1.5 10*3/uL — ABNORMAL HIGH (ref 0.0–1.3)
Neutrophils %: 57.7 %
Neutrophils Absolute: 4.2 10*3/uL (ref 1.7–7.7)
Platelets: 209 10*3/uL (ref 135–450)
RBC: 4.43 M/uL (ref 4.20–5.90)
RDW: 15 % (ref 12.4–15.4)
WBC: 7.3 10*3/uL (ref 4.0–11.0)

## 2017-01-24 MED ORDER — SODIUM CHLORIDE 0.9 % IV BOLUS
0.9 % | Freq: Once | INTRAVENOUS | Status: AC
Start: 2017-01-24 — End: 2017-01-24
  Administered 2017-01-24: 23:00:00 1000 mL via INTRAVENOUS

## 2017-01-24 MED FILL — SODIUM CHLORIDE 0.9 % IV SOLN: 0.9 % | INTRAVENOUS | Qty: 1000

## 2017-01-24 NOTE — Discharge Instructions (Signed)
Continue drinking plenty of fluids.  Continue all home medications, you may continue loperamide.  Dr. Hunt Oris will be in the office tomorrow starting at 9 AM if you feel like you need fluids again.  Otherwise follow-up as scheduled.  Return here for any emergent concerns.

## 2017-01-24 NOTE — ED Provider Notes (Signed)
ED Attending Attestation Note     Date of evaluation: 01/24/2017    This patient was seen by the advance practice provider.  I have seen and examined the patient, agree with the workup, evaluation, management and diagnosis. The care plan has been discussed.  My assessment reveals A well-appearing Caucasian male with a history of multiple myeloma is in a new chemotherapy regimen his had 3 days of nonbloody nonpainful diarrhea.  He currently drinks anything up to 60 times a day he's been having liquid diarrhea and is worried that is dehydrated.  He feels weak and tired but denies any fevers or cough chest pain or difficulty breathing.  On my examination is non-peritoneal nontender abdomen     Gardiner Barefoot, MD  01/24/17 [phone]

## 2017-01-24 NOTE — ED Provider Notes (Signed)
[facility] [place] ENCOUNTER          PHYSICIAN ASSISTANT NOTE       Date of evaluation: 01/24/2017    Chief Complaint     Diarrhea      History of Present Illness     David Watkins is a 61 y.o. male who presents For diarrhea.  Patient started oral chemotherapy for multiple myeloma 2 weeks ago.  Patient reports for the past 3 days he has had diarrhea, up to 6-8 times per day.  No blood in the stool.  No abdominal pain.  No nausea or vomiting or fevers.  Patient is tolerating p.o. intake.  Patient is concerned that he is becoming dehydrated and just wanted to be checked. Patient has been attempting to stay hydrated with water and gatorade.    Review of Systems     Review of Systems   Constitutional: Negative.  Negative for fever.   Respiratory: Negative.  Negative for shortness of breath.    Cardiovascular: Negative.  Negative for chest pain.   Gastrointestinal: Positive for diarrhea. Negative for abdominal pain, blood in stool, nausea and vomiting.   Genitourinary: Negative.  Negative for dysuria.   Musculoskeletal: Negative.    Skin: Negative.  Negative for rash.   Neurological: Negative.    All other systems reviewed and are negative.      Past Medical, Surgical, Family, and Social History     He has a past medical history of Bone pain; Hyperlipidemia; Low back pain; Multiple myeloma ([place]); and Neuropathy ([place]).  He has a past surgical history that includes bone marrow biopsy; other surgical history (Left, 08/17/2016); and bone marrow transplant.  His family history includes Heart Attack in his brother; Heart Disease in his mother; Lung Cancer in his father.  He reports that he quit smoking about 4 years ago. He has never used smokeless tobacco. He reports that he does not drink alcohol or use drugs.    Medications     Previous Medications    ALBUTEROL SULFATE HFA (PROAIR HFA) 108 (90 BASE) MCG/ACT INHALER    Inhale 2 puffs into the lungs every 6 hours as needed for Wheezing     ATORVASTATIN (LIPITOR) 10 MG TABLET    Take 10 mg by mouth daily    GABAPENTIN (NEURONTIN) 300 MG CAPSULE    Take 300 mg by mouth 3 times daily..    MORPHINE (KADIAN) 10 MG EXTENDED RELEASE CAPSULE    Take 10 mg by mouth 2 times daily..    MORPHINE (MSIR) 15 MG TABLET    Take 15 mg by mouth daily..    SERTRALINE (ZOLOFT) 50 MG TABLET    Take 1 tablet by mouth daily    UMECLIDINIUM-VILANTEROL (ANORO ELLIPTA) 62.5-25 MCG/INH AEPB INHALER    Inhale 1 puff into the lungs daily    VALACYCLOVIR (VALTREX) 500 MG TABLET    Take 500 mg by mouth 2 times daily       Allergies     He is allergic to pomalidomide; ceclor [cefaclor]; and lenalidomide.    Physical Exam     INITIAL VITALS: BP: (!) 143/78, Temp: 98 F (36.7 C), Pulse: 78, Resp: 18, SpO2: 98 %   Physical Exam   Constitutional: He is oriented to person, place, and time. He appears well-developed and well-nourished. No distress.   HENT:   Head: Normocephalic and atraumatic.   Mucous membranes somewhat dry   Neck: Normal range of motion. Neck supple.  Cardiovascular: Normal rate, regular rhythm, normal heart sounds and intact distal pulses.    Pulmonary/Chest: Effort normal and breath sounds normal.   Abdominal: Soft. Bowel sounds are normal. He exhibits no distension. There is no tenderness.   Musculoskeletal: Normal range of motion.   Neurological: He is alert and oriented to person, place, and time. No cranial nerve deficit. He exhibits normal muscle tone. Coordination normal.   Skin: Skin is warm and dry. He is not diaphoretic.   Psychiatric: He has a normal mood and affect. His behavior is normal. Judgment and thought content normal.   Nursing note and vitals reviewed.      Diagnostic Results       RADIOLOGY:  No orders to display       LABS:   Results for orders placed or performed during the hospital encounter of 01/24/17   CBC auto differential   Result Value Ref Range    WBC 7.3 4.0 - 11.0 K/uL    RBC 4.43 4.20 - 5.90 M/uL    Hemoglobin 12.8 (L) 13.5 - 17.5  g/dL    Hematocrit 39.0 (L) 40.5 - 52.5 %    MCV 88.0 80.0 - 100.0 fL    MCH 28.8 26.0 - 34.0 pg    MCHC 32.7 31.0 - 36.0 g/dL    RDW 15.0 12.4 - 15.4 %    Platelets 209 135 - 450 K/uL    MPV 6.6 5.0 - 10.5 fL    Neutrophils % 57.7 %    Lymphocytes % 17.2 %    Monocytes % 20.6 %    Eosinophils % 3.9 %    Basophils % 0.6 %    Neutrophils # 4.2 1.7 - 7.7 K/uL    Lymphocytes # 1.3 1.0 - 5.1 K/uL    Monocytes # 1.5 (H) 0.0 - 1.3 K/uL    Eosinophils # 0.3 0.0 - 0.6 K/uL    Basophils # 0.0 0.0 - 0.2 K/uL   POCT Venous   Result Value Ref Range    POC Sodium 141 136 - 145 mmol/L    POC Potassium 4.2 3.5 - 5.1 mmol/L    POC Chloride 104 99 - 110 mmol/L    CO2 25 21 - 32 mmol/L    POC Anion Gap 12 10 - 20    POC Glucose 92 70 - 99 mg/dl    POC BUN 9 7 - 18 mg/dL    POC Creatinine 1.0 0.9 - 1.3 mg/dL    GFR Non-African American >60 >60    GFR African American >60     Calcium, Ion 1.22 1.12 - 1.32 mmol/L    Sample Type VEN     Performed on SEE BELOW        ED BEDSIDE ULTRASOUND:      RECENT VITALS:  BP: (!) 143/78, Temp: 98 F (36.7 C), Pulse: 78, Resp: 18, SpO2: 98 %     Procedures         ED Course     Nursing Notes, Past Medical Hx, Past Surgical Hx, Social Hx, Allergies, and Family Hx were reviewed.    The patient was given the following medications:  Orders Placed This Encounter   Medications   . 0.9 % sodium chloride bolus       CONSULTS:  [place] / ASSESSMENT / PLAN     David Watkins is a 61 y.o. male with diarrhea.  Patient is well-appearing  and in no acute distress.  Vitals normal.  Abdomen benign. Patient given a liter normal saline.  Patient feels improved.  Patient is tolerating p.o. intake.  Blood counts and electrolytes and renal function reassuring.  I spoke with Dr. Hunt Oris who agrees with outpatient management, patient can continue loperamide as directed and can follow-up tomorrow in the office at 9 AM if he feels like further fluids are needed.  Patient comfortable  with this plan.  Return precautions discussed.    This patient was also evaluated by the attending physician. All care plans were discussed and agreed upon.    Clinical Impression     1. Diarrhea, unspecified type        Disposition     PATIENT REFERRED TO:  Harlene Salts, MD  [place] [address]  [phone]    Schedule an appointment as soon as possible for a visit       [facility] ED  [address]   [place] [address]  [phone]    As needed, If symptoms worsen      DISCHARGE MEDICATIONS:  New Prescriptions    No medications on file       DISPOSITION Decision To Discharge 01/24/2017 08:06:56 PM       Jerolyn Center, PA-C  01/24/17 2023

## 2017-02-25 ENCOUNTER — Encounter: Attending: Pulmonary Disease | Primary: Hematology & Oncology

## 2017-03-04 ENCOUNTER — Inpatient Hospital Stay
Admit: 2017-03-04 | Discharge: 2017-03-11 | Payer: BLUE CROSS/BLUE SHIELD | Attending: Internal Medicine | Primary: Hematology & Oncology

## 2017-03-04 DIAGNOSIS — H9390 Unspecified disorder of ear, unspecified ear: Secondary | ICD-10-CM

## 2017-03-04 NOTE — Plan of Care (Signed)
Problem: Wound:  Goal: Will show signs of wound healing; wound closure and no evidence of infection  Will show signs of wound healing; wound closure and no evidence of infection   Outcome: Completed Date Met: 03/04/17  Discharge instructions given.  Patient verbalized understanding.  Return to [facility] as needed  Referral to ENT- Dr Memory Dance    []  antibiotics    []  X-ray     []  Culture   []  Debridement      []  HBO Evaluation    []  LABS   []  Vascular Studies [] 

## 2017-03-04 NOTE — Progress Notes (Signed)
[place] [place]  Progress Note and Procedure Note      David Watkins  AGE: 61 y.o.   GENDER: male  DOB: 09-26-1955  TODAY'S DATE:  03/04/2017    Subjective:     Chief Complaint   Patient presents with   ??? Wound Check     left ear         HISTORY of PRESENT ILLNESS HPI     David Watkins is a 61 y.o. male who presents today for wound evaluation.   History of Wound: Noted "spot in left ear" approximately 3 months ago appear without obvious inciting trigger or preceding event. He felt it was similar "to a pimple " but would not come to a head or express/drain. More recently, he is concerned it could be cancerous given his own medical history. He has been under the care of [facility] for known myeloma s/p bone marrow transplant. He is a nonsmoker and compliant with his medications.    Wound Pain:  none  Severity:  0/ 10   Wound Type:    Modifying Factors:    Associated Signs/Symptoms: minimal drainage        PAST MEDICAL HISTORY        Diagnosis Date   ??? Bone pain    ??? Hyperlipidemia    ??? Low back pain    ??? Multiple myeloma ([place])    ??? Neuropathy ([place])     chemo induced, feet       PAST SURGICAL HISTORY    Past Surgical History:   Procedure Laterality Date   ??? BONE MARROW BIOPSY     ??? BONE MARROW TRANSPLANT     ??? OTHER SURGICAL HISTORY Left 08/17/2016    trifusion cath placement       FAMILY HISTORY    Family History   Problem Relation Age of Onset   ??? Heart Disease Mother    ??? Lung Cancer Father    ??? Heart Attack Brother        SOCIAL HISTORY    Social History   Substance Use Topics   ??? Smoking status: Former Smoker     Quit date: 11/25/2012   ??? Smokeless tobacco: Never Used   ??? Alcohol use No       ALLERGIES    Allergies   Allergen Reactions   ??? Pomalidomide Hives   ??? Ceclor [Cefaclor] Hives   ??? Lenalidomide Rash       MEDICATIONS    Current Outpatient Prescriptions on File Prior to Encounter   Medication Sig Dispense Refill   ??? gabapentin (NEURONTIN) 300 MG capsule Take 300 mg by mouth 3 times daily.Marland Kitchen     ???  valACYclovir (VALTREX) 500 MG tablet Take 500 mg by mouth 2 times daily     ??? umeclidinium-vilanterol (ANORO ELLIPTA) 62.5-25 MCG/INH AEPB inhaler Inhale 1 puff into the lungs daily 1 each 3     No current facility-administered medications on file prior to encounter.        REVIEW OF SYSTEMS    Pertinent per HPI      Objective:      BP 125/80    Pulse 66    Temp 97.5 ??F (36.4 ??C) (Oral)    Resp 18    Ht 5' 9"  (1.753 m)    Wt 190 lb (86.2 kg)    BMI 28.06 kg/m??     PHYSICAL EXAM    General Appearance: alert and oriented to  person, place and time, well-developed and well-nourished, in no acute distress  Skin: warm and dry  Head: normocephalic and atraumatic  Eyes: extraocular eye movements intact and conjunctivae normal  Extremities: no cyanosis and no clubbing  Musculoskeletal: normal range of motion, no joint swelling, deformity or tenderness      Assessment:     Patient Active Problem List   Diagnosis   ??? Multiple myeloma not having achieved remission ([place])   ??? COPD, severe ([place])   ??? Multiple myeloma in remission ([place])   ??? Moderate malnutrition ([facility])                 Plan:     The nature of the patient's condition was explained in depth. The patient was informed that their compliance to the treatment plan is paramount to successful healing and prevention of further ulceration and/or infection     Discharge Treatment      Written Patient Discharge Instructions Given  Active Orders   There are no active orders.     Wound: Left ear     With each dressing change, rinse wounds with 0.9% Saline. (May use wound wash or soft contact solution. Both can be purchased at a local drug store). If unable to obtain saline, may use a gentle soap and water.    Dressing care: Betadine daily. You will need to see an Ear[facility] [place] physician, for likely biopsy.       [facility]  [address] [place] [address]   Phone: [phone]   Fax: [phone]    Thank you for allowing me to  participate in the care of your patient. Please do not hesitate to call.     Steffanie Rainwater, D.O., [facility]  Interventional Cardiology     o: [phone]  [address] [place] [address]

## 2017-03-04 NOTE — Discharge Instructions (Addendum)
Congratulations! You have completed your treatment program. We have outlined a list of reminders to assist you in maintaining your continues health.    1. Return to your primary care physician for all your health issues.   2. Resume your ordinary activities as prescribed.  3. Take your medications as prescribed by your doctor.  4. Check your skin daily for cracks, bruises, sores, or dryness.  5. Clean and dry your skin thoroughly.  6. Avoid alcohol. Quit smoking if applicable.  7. Maintain a nutritious diet; take a multivitamin daily.  8. Avoid pressure on the wound site. Keep your legs elevated above the level of your heart whenever possible.  9. Continue to use wraps/stockings/compresion if prescribed/  10. Replace compression hose/stockings every 6 months to insure proper fit.  11. Wear well fitting shoes.    Call the [facility] if your wound reopens or you develop new wounds or if you have any other questions about follow up care [phone]     Wound: Left ear     With each dressing change, rinse wounds with 0.9% Saline. (May use wound wash or soft contact solution. Both can be purchased at a local drug store). If unable to obtain saline, may use a gentle soap and water.    Dressing care: Betadine daily. You will need to see an Ear[facility] N[facility] Throat Dr.       Doris Cheadle  [facility]  [address] [place] [address]   Phone: [phone]   Fax: [phone]

## 2017-03-08 ENCOUNTER — Encounter

## 2017-03-08 ENCOUNTER — Inpatient Hospital Stay: Admit: 2017-03-08 | Attending: Hematology & Oncology | Primary: Hematology & Oncology

## 2017-03-08 DIAGNOSIS — R911 Solitary pulmonary nodule: Secondary | ICD-10-CM

## 2017-03-08 MED ORDER — IOPAMIDOL 76 % IV SOLN
76 % | Freq: Once | INTRAVENOUS | Status: AC | PRN
Start: 2017-03-08 — End: 2017-03-08
  Administered 2017-03-08: 16:00:00 75 mL via INTRAVENOUS

## 2017-03-15 ENCOUNTER — Ambulatory Visit
Admit: 2017-03-15 | Discharge: 2017-03-15 | Payer: BLUE CROSS/BLUE SHIELD | Attending: Plastic Surgery within the Head & Neck | Primary: Hematology & Oncology

## 2017-03-15 DIAGNOSIS — L989 Disorder of the skin and subcutaneous tissue, unspecified: Secondary | ICD-10-CM

## 2017-03-15 NOTE — Progress Notes (Signed)
Subjective:      Patient ID: David Watkins is a 61 y.o. male.  Patient has a history of multiple myeloma for which she has undergone extensive chemotherapy including bone marrow transplant.  He is still on therapy.  In April, a sore was noted in the left concha of his external ear.  This was initially treated with antibiotic and other ointments with no particular improvement.  He went to the wound clinic last week and they found no evidence of it being an actual wound but didn't feel it needed to be evaluated with a biopsy.  He has noted some oozing in the area but no true bleeding.  It is quite painful.  He's had no history of trauma.  He does not smoke.  There has been no fever.  HPI    Review of Systems   Constitutional: Positive for fatigue. Negative for fever.   HENT: Negative.    Eyes: Negative.    Respiratory: Negative.    Cardiovascular: Negative.    Endocrine: Negative.    Skin:        Skin lesion right ear   Allergic/Immunologic: Negative.    Neurological: Negative.    Hematological: Negative.    Psychiatric/Behavioral: Negative.        Objective:   Physical Exam   Constitutional: He is oriented to person, place, and time. He appears well-developed and well-nourished.   HENT:   Head: Normocephalic and atraumatic.   Right Ear: External ear normal.   Nose: Nose normal.   Mouth/Throat: Oropharynx is clear and moist. No oropharyngeal exudate.   Indirect laryngoscopy is unremarkable.  Examination of the left ear reveals a normal tympanic membrane.  However, on the concha inferiorly there is a 1 cm lesion which has appearance of an ulceration.  It could be a squamous cell or basal cell carcinoma but it is quite tender.   Eyes: Conjunctivae are normal. Pupils are equal, round, and reactive to light.   Neck: Normal range of motion. Neck supple. No tracheal deviation present. No thyromegaly present.   Cardiovascular: Normal rate and regular rhythm.    Pulmonary/Chest: Effort normal.   Lymphadenopathy:     He has no  cervical adenopathy.   Neurological: He is alert and oriented to person, place, and time.   Skin: Skin is warm and dry.   Psychiatric: He has a normal mood and affect. His behavior is normal. Judgment and thought content normal.   Patient is somewhat anxious       Assessment:      Lesion concha left external ear with it possibly being a malignancy.      Plan:      I explained the situation to the patient in that a simple biopsy would still leave him with a painful lesion.  Therefore, best treatment would be the excision with a full-thickness skin graft and frozen section analysis.  He would like to have this done under a general anesthetic due to his anxiety which is understandable.

## 2017-03-23 NOTE — Patient Instructions (Signed)
Name_______________________________________Printed:____________________  Date and time of surgery___7/30/18  0700_____________________Arrival Time:___0530  [facility]_____________   1. Do not eat or drink anything after 12 midnight (or____hours) prior to surgery. This includes no water, chewing gum or mints.Endoscopy patients follow your doctors bowel prep instructions,which may include taking part of prep after midnight.   2. Take the following pills with a small sip of water on the morning of surgery_____________________________________________________   3. Aspirin, Ibuprofen, Advil, Naproxen, Vitamin E and other Anti-inflammatory products should be stopped for 5 days before surgery or as directed by your physician.   4. Check with your Doctor regarding stopping Plavix, Coumadin,Eliquis, Lovenox,Effient,Pradaxa,Xarelto, Fragmin or other blood thinners and follow their instructions.   5. Do not smoke, and do not drink any alcoholic beverages 24 hours prior to surgery.  This includes NA Beer.Refrain from the usage of any recreational drugs.   6. You may brush your teeth and gargle the morning of surgery.  DO NOT SWALLOW WATER   7. You MUST make arrangements for a responsible adult to stay on site while you are here and take you home after your surgery. You will not be allowed to leave alone or drive yourself home.  It is strongly suggested someone stay with you the first 24 hrs. Your surgery will be cancelled if you do not have a ride home.   8. A parent/legal guardian must accompany a child scheduled for surgery and plan to stay at the hospital until the child is discharged.  Please do not bring other children with you.   9. Please wear simple, loose fitting clothing to the hospital.  Do not bring valuables (money, credit cards, checkbooks, etc.) Do not wear any makeup (including no eye makeup) or nail polish on your fingers or toes.             10. DO NOT wear any jewelry or piercings on day of surgery.  All body  piercing jewelry must be removed.             11. If you have ___dentures, they will be removed before going to the OR; we will provide you a container.  If you wear ___contact lenses or ___glasses, they will be removed; please bring a case for them.             12. Please see your family doctor/pediatrician for a history & physical and/or concerning medications.  Bring any test results/reports from your physician's office.   PCP__________________Phone___________H&P Appt. Date________             13 If you  have a Living Will and Durable Power of Attorney for Healthcare, please bring in a copy.             68. Notify your Surgeon if you develop any illness between now and surgery  time, cough, cold, fever, sore throat, nausea, vomiting, etc.  Please notify your surgeon if you experience dizziness, shortness of breath or blurred vision between now & the time of your surgery             15. DO NOT shave your operative site 96 hours prior to surgery. For face & neck surgery, men may use an electric razor 48 hours prior to surgery.             16. Shower the night before surgery with _X__Antibacterial soap ___Hibiclens             17. To provide excellent care visitors will be limited to  one in the room at any given time.             18.  Please bring picture ID and insurance card.             19.  Visit our web site for additional information:  [email or web address]              20.During flu season no children under the age of 30 are permitted in the hospital for the safety of all patients.                              21. If you take a long acting insulin in the evening only  take half of your usual  dose the night  before your procedure              22. If you use a c-pap please bring DOS if staying overnight,             23.For your convenience [facility] has a pharmacy on site to fill your prescriptions.             24. If you use oxygen and have a portable tank please bring it  with you the DOS             25.  Bring a complete list of all your medications with name and dose include any supplements.             26. Other__________________________________________   *Please call pre admission testing if you any further questions   [facility]         [place]   [place]    [place] [place]  [phone]   [place]       All above information reviewed with patient in person or by phone.Patient verbalizes understanding.All questions and concerns addressed.                                                                                                 Patient/Rep___PATIENT_________________                                                                                                                                    PRE OP INSTRUCTIONS

## 2017-03-23 NOTE — Telephone Encounter (Signed)
The patient called stating that he  takes 325 mg  aspirin as part of cancer maintenance because chemo thickens blood. He says the  nurse called him saying no aspirin for 5 days prior to procedure next Monday for an  ear lesion biopsy excision  03/29/17. He is unable to stop the aspirin please advise.      [phone].pt. contact

## 2017-03-23 NOTE — Telephone Encounter (Signed)
Really is not a problem as far as I'm concerned.  He should go ahead and continue the aspirin

## 2017-03-23 NOTE — Telephone Encounter (Signed)
Please advise

## 2017-03-23 NOTE — Telephone Encounter (Signed)
LM advising pt of message below.

## 2017-03-29 ENCOUNTER — Inpatient Hospital Stay
Admit: 2017-03-29 | Discharge: 2017-04-02 | Payer: BLUE CROSS/BLUE SHIELD | Attending: Plastic Surgery within the Head & Neck | Primary: Hematology & Oncology

## 2017-03-29 DIAGNOSIS — L989 Disorder of the skin and subcutaneous tissue, unspecified: Secondary | ICD-10-CM

## 2017-03-29 MED ORDER — ACETAMINOPHEN 325 MG PO TABS
325 MG | ORAL | Status: DC | PRN
Start: 2017-03-29 — End: 2017-03-30

## 2017-03-29 MED ORDER — MINERAL OIL LIGHT OIL
Status: AC
Start: 2017-03-29 — End: 2017-03-29

## 2017-03-29 MED ORDER — ACETAMINOPHEN-CODEINE 300-30 MG PO TABS
300-30 MG | ORAL_TABLET | ORAL | 0 refills | Status: AC | PRN
Start: 2017-03-29 — End: 2017-04-05

## 2017-03-29 MED ORDER — OXYCODONE-ACETAMINOPHEN 5-325 MG PO TABS
5-325 MG | ORAL | Status: AC | PRN
Start: 2017-03-29 — End: 2017-03-29

## 2017-03-29 MED ORDER — MEPERIDINE HCL 25 MG/ML IJ SOLN
25 MG/ML | INTRAMUSCULAR | Status: DC | PRN
Start: 2017-03-29 — End: 2017-03-30

## 2017-03-29 MED ORDER — ONDANSETRON HCL 4 MG/2ML IJ SOLN
4 MG/2ML | Freq: Four times a day (QID) | INTRAMUSCULAR | Status: DC | PRN
Start: 2017-03-29 — End: 2017-03-30

## 2017-03-29 MED ORDER — METHYLPREDNISOLONE 4 MG PO TBPK
4 MG | PACK | ORAL | 0 refills | Status: AC
Start: 2017-03-29 — End: 2017-04-04

## 2017-03-29 MED ORDER — LIDOCAINE-EPINEPHRINE 1 %-1:100000 IJ SOLN
1 | INTRAMUSCULAR | Status: AC
Start: 2017-03-29 — End: 2017-03-29

## 2017-03-29 MED ORDER — FENTANYL CITRATE (PF) 100 MCG/2ML IJ SOLN
100 MCG/2ML | INTRAMUSCULAR | Status: DC | PRN
Start: 2017-03-29 — End: 2017-03-30

## 2017-03-29 MED ORDER — DEXAMETHASONE SODIUM PHOSPHATE 4 MG/ML IJ SOLN
4 MG/ML | Freq: Once | INTRAMUSCULAR | Status: AC
Start: 2017-03-29 — End: 2017-03-29
  Administered 2017-03-29: 12:00:00 2 mg via INTRAVENOUS

## 2017-03-29 MED ORDER — ONDANSETRON HCL 4 MG/2ML IJ SOLN
4 MG/2ML | Freq: Once | INTRAMUSCULAR | Status: AC | PRN
Start: 2017-03-29 — End: 2017-03-29

## 2017-03-29 MED ORDER — NORMAL SALINE FLUSH 0.9 % IV SOLN
0.9 % | INTRAVENOUS | Status: DC | PRN
Start: 2017-03-29 — End: 2017-03-30

## 2017-03-29 MED ORDER — DIPHENHYDRAMINE HCL 50 MG/ML IJ SOLN
50 MG/ML | Freq: Once | INTRAMUSCULAR | Status: AC | PRN
Start: 2017-03-29 — End: 2017-03-29

## 2017-03-29 MED ORDER — NORMAL SALINE FLUSH 0.9 % IV SOLN
0.9 % | Freq: Two times a day (BID) | INTRAVENOUS | Status: DC
Start: 2017-03-29 — End: 2017-03-29

## 2017-03-29 MED ORDER — BACITRACIN-POLYMYXIN B 500-10000 UNIT/GM EX OINT
500-10000 | CUTANEOUS | Status: AC
Start: 2017-03-29 — End: 2017-03-29

## 2017-03-29 MED ORDER — LABETALOL HCL 5 MG/ML IV SOLN
5 MG/ML | INTRAVENOUS | Status: DC | PRN
Start: 2017-03-29 — End: 2017-03-30

## 2017-03-29 MED ORDER — DEXAMETHASONE 4 MG/ML IJ SOLN (MIXTURES ONLY)
4 MG/ML | Freq: Once | INTRAMUSCULAR | Status: DC
Start: 2017-03-29 — End: 2017-03-29

## 2017-03-29 MED ORDER — AZITHROMYCIN 250 MG PO TABS
250 MG | PACK | ORAL | 0 refills | Status: AC
Start: 2017-03-29 — End: 2017-04-02

## 2017-03-29 MED ORDER — LACTATED RINGERS IV SOLN
INTRAVENOUS | Status: DC
Start: 2017-03-29 — End: 2017-03-30
  Administered 2017-03-29: 11:00:00 via INTRAVENOUS

## 2017-03-29 MED ORDER — DEXAMETHASONE SODIUM PHOSPHATE 4 MG/ML IJ SOLN
4 MG/ML | INTRAMUSCULAR | Status: AC
Start: 2017-03-29 — End: 2017-03-29

## 2017-03-29 MED FILL — LIDOCAINE-EPINEPHRINE 1 %-1:100000 IJ SOLN: 1 %-:00000 | INTRAMUSCULAR | Qty: 20

## 2017-03-29 MED FILL — SODIUM CHLORIDE FLUSH 0.9 % IV SOLN: 0.9 % | INTRAVENOUS | Qty: 10

## 2017-03-29 MED FILL — MURI-LUBE OIL: Qty: 10

## 2017-03-29 MED FILL — DEXAMETHASONE SODIUM PHOSPHATE 4 MG/ML IJ SOLN: 4 MG/ML | INTRAMUSCULAR | Qty: 1

## 2017-03-29 MED FILL — POLYSPORIN 500-10000 UNIT/GM EX OINT: 500-10000 UNIT/GM | CUTANEOUS | Qty: 14.2

## 2017-03-29 NOTE — Op Note (Signed)
[place] [address]                                 OPERATIVE REPORT    PATIENT NAME: David Watkins, David Watkins                      DOB:        09/11/1955  MED REC NO:   [id number]                          ROOM:  ACCOUNT NO:   [id number]                          ADMIT DATE: 03/29/2017  PROVIDER:     Jeri Modena    DATE OF PROCEDURE:  03/29/2017    ADDENDUM    PREOPERATIVE DIAGNOSIS:  Lesion, left external ear.    POSTOPERATIVE DIAGNOSIS:  Lesion, left external ear.    ADDENDUM:  The size of the initial lesion was 1.5 cm in diameter with  margins of approximately 0.5 cm, so the total was approximately 2 cm in  diameter including the removal of postauricular skin for closure.        Aavya Shafer    D: 03/29/2017 10:55:31       T: 03/29/2017 11:09:44     [id number]  Job#: [id number]     Doc#: [id number]    CC:

## 2017-03-29 NOTE — Progress Notes (Addendum)
Patient is alert and oriented. vss on room air, denies pain. Significant other at bedside. Moved to phase two care.

## 2017-03-29 NOTE — Progress Notes (Signed)
Teaching / education initiated regarding perioperative experience, expectations, and pain management during stay. Patient verbalized understanding.

## 2017-03-29 NOTE — Discharge Instructions (Signed)
Follow up with Dr. Theora Master tomorrow in his office for wound recheck.  Do not get dressing wet  Follow Dr. Donney Dice instructions.  Take medications as directed    Dr. Donney Dice office number is  [phone].    GENERAL SURGERY DISCHARGE INSTRUCTIONS     Follow your surgeons instructions.   Follow up with your surgeon as directed.   Observe the operative area for signs of excessive bleeding.If needed apply pressure,elevate if able and contact your surgeon.   Observe the operative site for any signs of infection- such as increased pain,redness,fever greater than 101 degrees,swelling, foul odor or drainage.Contact your surgeon if any of these symptoms are present.   Keep operative site clean and dry.   Do not remove dressing unless instructed to by surgeon.   Apply ice as directed.   If unable to urinate once you are at home,  notify your surgeon or go to the Emergency Room.   Avoid pulling,pushing or tugging to suture line.   If you become short of breath call your doctor or go to the ER.   Take medications as directed.   Pain medication should be taken with food.   Do not drive or operate machinery while taking narcotics.   For any problems or question call your surgeon.    ANESTHESIA DISCHARGE INSTRUCTIONS     Wear your seatbelt home.   You are under the influence of drugs-do not drink alcohol,drive,operate machinery,or make any important decisions or sign any legal documentsfor 24 hours   A responsible adult needs to be with you for 24 hours.   You may experience lightheadedness,dizziness,or sleepiness following surgery.   Rest at home today- increase activity as tolerated.   Progress slowly to a regular diet unless your physician has instructed you otherwise.Drink plenty of water.   If nausea becomes a problem call your physician.   Coughing,sore throat,and muscle aches are other side effects of anesthesia,and should improve with time.   Do not drive,operate machinery while taking  narcotics.

## 2017-03-29 NOTE — Brief Op Note (Signed)
Brief Postoperative Note    David Watkins  Date of Birth:  1955-11-04  [id number]    Pre-operative Diagnosis: lesion left external ear    Post-operative Diagnosis: Same    Procedure: wide excision lesion    Anesthesia: gen    Surgeons/Assistants: Jesyka Slaght    Estimated Blood Loss: 5 ml.    Complications: none    Specimens: lesion    Findings: as directed    Electronically signed by Jeri Modena, MD on 03/29/2017 at 8:06 AM

## 2017-03-29 NOTE — Progress Notes (Signed)
Pt arrived to PACU from OR in stable condition and is drowsy, oral airway in place.   Pt moves all extremities to command.  Respirations are even and unlabored on 6L O2 per NC.  Skin warm, dry, and with normal color.  Abd is soft.  Pain is unknown at this time.  Left ear surgical site(s) intact with xeroform  Will continue to monitor for safety and comfort.    S/P: EXCISE LESION LEFT EXTERNAL EAR WITH FROZEN SECTION, FULL  THICKNESS SKIN GRAFT ?? , with Dr. Theora Master at [facility].

## 2017-03-29 NOTE — Progress Notes (Signed)
Patient drinking fluids, spoke with Dr. Theora Master before d/c  Discharge instructions reviewed with patient/responsible adult. All home medications have been reviewed, questions answered and patient verbalized understanding.  Discharge instructions signed and copies given. Patient discharged home via wheelchair with belongings.     Sent home with 3 filled prescriptions

## 2017-03-29 NOTE — Progress Notes (Signed)
Oral airway, patient wakes to voice, vss, will continue to monitor.

## 2017-03-29 NOTE — Progress Notes (Signed)
CLINICAL PHARMACY NOTE: MEDS TO [place] [facility] Patient?: No  Total # of Prescriptions Filled: 1   The following medications were delivered to the patient:  ?? Acetaminophen/codeine 300-30  Total # of Interventions Completed: 0  Time Spent (min): 15    Additional Documentation:  Returned steroid script to patient upon delivery of pain med.  Delivered medication to patient, friend signed.

## 2017-03-29 NOTE — Anesthesia Pre-Procedure Evaluation (Addendum)
Department of Anesthesiology  Preprocedure Note       Name:  David Watkins   Age:  61 y.o.  DOB:  September 12, 1955                                          MRN:  [id number]         Date:  03/29/2017      Surgeon:    Procedure:    Medications prior to admission:   Prior to Admission medications    Medication Sig Start Date End Date Taking? Authorizing Provider   atorvastatin (LIPITOR) 10 MG tablet Take 10 mg by mouth daily    Historical Provider, MD   lenalidomide (REVLIMID) 10 MG chemo capsule Take 10 mg by mouth daily    Historical Provider, MD   aspirin 325 MG tablet Take 325 mg by mouth daily    Historical Provider, MD   gabapentin (NEURONTIN) 300 MG capsule Take 300 mg by mouth 4 times daily. Marland Kitchen    Historical Provider, MD   valACYclovir (VALTREX) 500 MG tablet Take 500 mg by mouth 2 times daily    Historical Provider, MD       Current medications:    Current Outpatient Prescriptions   Medication Sig Dispense Refill   ??? atorvastatin (LIPITOR) 10 MG tablet Take 10 mg by mouth daily     ??? lenalidomide (REVLIMID) 10 MG chemo capsule Take 10 mg by mouth daily     ??? aspirin 325 MG tablet Take 325 mg by mouth daily     ??? gabapentin (NEURONTIN) 300 MG capsule Take 300 mg by mouth 4 times daily. Marland Kitchen     ??? valACYclovir (VALTREX) 500 MG tablet Take 500 mg by mouth 2 times daily       No current facility-administered medications for this encounter.        Allergies:    Allergies   Allergen Reactions   ??? Lenalidomide Rash   ??? Pomalidomide Hives   ??? Ceclor [Cefaclor] Hives       Problem List:    Patient Active Problem List   Diagnosis Code   ??? Multiple myeloma not having achieved remission ([facility]) C90.00   ??? 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 Multiple myeloma in remission ([place]) C90.01   ??? Moderate malnutrition ([facility]) E44.0   ??? Open wound of left ear S01.302A   ??? Ear lesion H61.899   ??? Skin lesion L98.9       Past Medical History:        Diagnosis Date   ??? Bone pain    ??? Hyperlipidemia    ??? Low back pain    ??? Multiple myeloma ([place])    ??? 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 BONE MARROW BIOPSY     ??? BONE MARROW TRANSPLANT     ??? OTHER SURGICAL HISTORY Left 08/17/2016    trifusion cath placement       Social History:    Social History   Substance Use Topics   ??? Smoking status: Former Smoker     Quit date: 11/25/2012   ??? Smokeless tobacco: Never Used   ??? Alcohol use No  Counseling given: Not Answered      Vital Signs (Current): There were no vitals filed for this visit.                                           BP Readings from Last 3 Encounters:   03/15/17 120/80   03/04/17 125/80   01/24/17 (!) 143/78       NPO Status:                                                                                 BMI:   Wt Readings from Last 3 Encounters:   03/23/17 190 lb (86.2 kg)   03/04/17 190 lb (86.2 kg)   01/24/17 175 lb (79.4 kg)     There is no height or weight on file to calculate BMI.    Anesthesia Evaluation  Patient summary reviewed and Nursing notes reviewed  Airway: Mallampati: III  TM distance: >3 FB   Neck ROM: limited  Mouth opening: > = 3 FB Dental: normal exam         Pulmonary:   (+) COPD: mild and moderate,                             Cardiovascular:  Exercise tolerance: good (>4 METS),                     Neuro/Psych:               GI/Hepatic/Renal:             Endo/Other:                      ROS comment: Multiple myeloma Abdominal:           Vascular:                                      Anesthesia Plan      general     ASA 3     (Took advaire today  Has a chronic cough since chemiotherapy)  Induction: intravenous.    MIPS: Postoperative opioids intended and Prophylactic antiemetics administered.  Anesthetic plan and risks discussed with patient.      Plan discussed with CRNA.                JEAN-PAUL Brexley Cutshaw, MD   03/29/2017

## 2017-03-29 NOTE — Anesthesia Post-Procedure Evaluation (Signed)
Anesthesia Post-op Note    Patient: David Watkins  MRN: [id number]  Birthdate: [birth year]  Date of evaluation: 03/29/2017  Time:  2:33 PM     Procedure(s) Performed:     Last Vitals: BP 111/70   Pulse 76   Temp 98.4 F (36.9 C) (Temporal)   Resp 16   Ht _0  (1.753 m)   Wt 189 lb 1.6 oz (85.8 kg)   SpO2 94%   BMI 27.93 kg/m     Aldrete Phase I: Aldrete Score: 10    Aldrete Phase II: Aldrete Score: 10    Anesthesia Post Evaluation    Final anesthesia type: MAC and TIVA  Patient location during evaluation: PACU  Level of consciousness: awake and alert  Complications: no  Respiratory status: acceptable  Hydration status: stable        JEAN-PAUL Meagon Duskin, MD  2:33 PM

## 2017-03-29 NOTE — Op Note (Signed)
[place] [address]                                 OPERATIVE REPORT    PATIENT NAME: David Watkins, David Watkins                      DOB:        1955/11/03  MED REC NO:   [id number]                          ROOM:  ACCOUNT NO:   [id number]                          ADMIT DATE: 03/29/2017  PROVIDER:     Jeri Modena    DATE OF PROCEDURE:  03/29/2017    PREOPERATIVE DIAGNOSIS:  Lesion, left external ear.    POSTOPERATIVE DIAGNOSIS:  Lesion, left external ear.    OPERATION PERFORMED:  Wide excision of lesion, left ear near concha  inferiorly with full thickness skin graft.    SURGEON:  Jeri Modena, MD.    ANESTHESIA:  General.    OPERATIVE PROCEDURE:  The adequately premedicated patient was brought to  the operating room and placed in a supine position.  Anesthesia was induced  to satisfactory level with LMA in place.  The patient has a lesion that is  somewhat painful, located inferiorly in the conchal area extending to the  lower margin of the cartilage laterally.  The area was injected with 1%  Xylocaine with epinephrine 1:100,000 with 4 mL utilized.  The area was then  prepped and draped in usual fashion.  Planned excision corresponding to a  wide enough margin around the lesion was outlined with methylene blue.   Sharp dissection was then utilized.  The lesion was excised, including its  deep margin, where it extended down to the cartilage itself with a small  ulceration located in the midportion.  Specimen was sent to pathology for  evaluation.  Any bleeding encountered was treated with electrocoagulation.   The measurements were made and a corresponding area was outlined with  methylene blue posterior to the ear for full thickness skin graft.  The  graft was then excised and defatted.  It was then placed in the depths of  the area after frozen section returned as uncertain as to the margins, but  it seemed that the margins were  clear.  The specimen was then sutured in  place with 5-0 nylon and they were tied overlying a cotton impregnated with  mineral oil and covered with Xeroform.  Having tied these down, the area  behind the ear was then closed by elevating flaps anterior and posteriorly  with interrupted 5-0 nylon.  Steri-Strips were applied.  The patient  tolerated the procedure well and was sent to the recovery room in good  condition.        [name]    D: 03/29/2017 8:20:23       T: 03/29/2017 8:53:44     [id number]  Job#: [id number]     Doc#: [id number]    CC:

## 2017-03-30 MED FILL — LIDOCAINE HCL (CARDIAC) 20 MG/ML IV SOLN: 20 MG/ML | INTRAVENOUS | Qty: 5

## 2017-03-30 MED FILL — ONDANSETRON HCL 4 MG/2ML IJ SOLN: 4 MG/2ML | INTRAMUSCULAR | Qty: 2

## 2017-03-30 MED FILL — DEXAMETHASONE SODIUM PHOSPHATE 20 MG/5ML IJ SOLN: 20 MG/5ML | INTRAMUSCULAR | Qty: 5

## 2017-03-30 MED FILL — FENTANYL CITRATE (PF) 250 MCG/5ML IJ SOLN: 250 MCG/5ML | INTRAMUSCULAR | Qty: 2

## 2017-03-30 MED FILL — DIPRIVAN 200 MG/20ML IV EMUL: 200 MG/20ML | INTRAVENOUS | Qty: 10

## 2017-04-05 ENCOUNTER — Ambulatory Visit
Admit: 2017-04-05 | Discharge: 2017-04-05 | Payer: BLUE CROSS/BLUE SHIELD | Attending: Plastic Surgery within the Head & Neck | Primary: Hematology & Oncology

## 2017-04-05 DIAGNOSIS — C44229 Squamous cell carcinoma of skin of left ear and external auricular canal: Secondary | ICD-10-CM

## 2017-04-05 NOTE — Progress Notes (Signed)
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  Margins were suspect.  Therefore, we will try as soon as things have healed to use Efudex.  Sutures removed from the donor site as well as from the bolus applied.  The graft looks quite good and is healing well.  He is instructed to clean with peroxide and apply Polysporin ointment twice daily.  I will see him again in 1 week.

## 2017-04-12 NOTE — Progress Notes (Signed)
Patient seems to be doing well.  Margins of the graft site seemed to be quite good except somewhat more the most medial one and this is cleaned with peroxide.  He will continue peroxide cleaning twice daily followed by Polysporin ointment and I will see him in 2 weeks.  At that time, it is quite likely that we will start Efudex treatment.

## 2017-04-23 ENCOUNTER — Inpatient Hospital Stay: Admit: 2017-04-23 | Payer: BLUE CROSS/BLUE SHIELD | Primary: Hematology & Oncology

## 2017-04-23 ENCOUNTER — Inpatient Hospital Stay
Admit: 2017-04-23 | Discharge: 2017-05-01 | Disposition: A | Discharge status: Home / Self Care | Payer: BLUE CROSS/BLUE SHIELD | Source: Ambulatory Visit | Attending: Hematology & Oncology | Admitting: Hematology & Oncology

## 2017-04-23 DIAGNOSIS — C9002 Multiple myeloma in relapse: Principal | ICD-10-CM

## 2017-04-23 LAB — URINALYSIS
Bilirubin Urine: NEGATIVE
Glucose, Ur: NEGATIVE mg/dL
Ketones, Urine: NEGATIVE mg/dL
Leukocyte Esterase, Urine: NEGATIVE
Nitrite, Urine: NEGATIVE
Protein, UA: 100 mg/dL — AB
Specific Gravity, UA: >=1.03 (ref 1.005–1.030)
Urobilinogen, Urine: 0.2 E.U./dL (ref <2.0)
pH, UA: 6 (ref 5.0–8.0)

## 2017-04-23 LAB — BASIC METABOLIC PANEL
Anion Gap: 12 (ref 3–16)
BUN: 21 mg/dL — ABNORMAL HIGH (ref 7–20)
CO2: 20 mmol/L — ABNORMAL LOW (ref 21–32)
Calcium: 9.2 mg/dL (ref 8.3–10.6)
Chloride: 99 mmol/L (ref 99–110)
Creatinine: 1.4 mg/dL — ABNORMAL HIGH (ref 0.9–1.3)
GFR African American: >60 (ref >60)
GFR Non-African American: 52 — AB (ref >60)
Glucose: 85 mg/dL (ref 70–99)
Potassium: 4.1 mmol/L (ref 3.5–5.1)
Sodium: 131 mmol/L — ABNORMAL LOW (ref 136–145)

## 2017-04-23 LAB — LIPASE: Lipase: 16 U/L (ref 13.0–60.0)

## 2017-04-23 LAB — MICROSCOPIC URINALYSIS

## 2017-04-23 LAB — PREPARE PLATELETS: Dispense Status Blood Bank: TRANSFUSED

## 2017-04-23 LAB — HEPATIC FUNCTION PANEL
ALT: 59 U/L — ABNORMAL HIGH (ref 10–40)
AST: 88 U/L — ABNORMAL HIGH (ref 15–37)
Albumin: 2.7 g/dL — ABNORMAL LOW (ref 3.4–5.0)
Alkaline Phosphatase: 187 U/L — ABNORMAL HIGH (ref 40–129)
Bilirubin, Direct: <0.2 mg/dL (ref 0.0–0.3)
Total Bilirubin: 1 mg/dL (ref 0.0–1.0)
Total Protein: 14.4 g/dL — ABNORMAL HIGH (ref 6.4–8.2)

## 2017-04-23 LAB — LACTATE DEHYDROGENASE: LD: 844 U/L — ABNORMAL HIGH (ref 100–190)

## 2017-04-23 LAB — MAGNESIUM: Magnesium: 2 mg/dL (ref 1.80–2.40)

## 2017-04-23 LAB — URIC ACID: Uric Acid, Serum: 5.9 mg/dL (ref 3.5–7.2)

## 2017-04-23 LAB — PHOSPHORUS: Phosphorus: 5 mg/dL — ABNORMAL HIGH (ref 2.5–4.9)

## 2017-04-23 LAB — AMYLASE: Amylase: 29 U/L (ref 25–115)

## 2017-04-23 LAB — LACTIC ACID: Lactic Acid: 1.3 mmol/L (ref 0.4–2.0)

## 2017-04-23 MED ORDER — MAGNESIUM HYDROXIDE 400 MG/5ML PO SUSP
400 MG/5ML | Freq: Every day | ORAL | Status: DC | PRN
Start: 2017-04-23 — End: 2017-05-01

## 2017-04-23 MED ORDER — GABAPENTIN 300 MG PO CAPS
300 MG | Freq: Three times a day (TID) | ORAL | Status: DC
Start: 2017-04-23 — End: 2017-05-01
  Administered 2017-04-23 – 2017-05-01 (×24): 300 mg via ORAL

## 2017-04-23 MED ORDER — OXYCODONE HCL 5 MG PO TABS
5 MG | ORAL | Status: DC | PRN
Start: 2017-04-23 — End: 2017-05-01
  Administered 2017-04-24 – 2017-04-30 (×9): 10 mg via ORAL

## 2017-04-23 MED ORDER — GUAIFENESIN-CODEINE 100-10 MG/5ML PO SOLN
100-105 MG/5ML | ORAL | Status: DC | PRN
Start: 2017-04-23 — End: 2017-04-26
  Administered 2017-04-24 – 2017-04-26 (×9): 5 mL via ORAL

## 2017-04-23 MED ORDER — LORAZEPAM 0.5 MG PO TABS
0.5 MG | ORAL | Status: DC | PRN
Start: 2017-04-23 — End: 2017-05-01

## 2017-04-23 MED ORDER — FENTANYL CITRATE (PF) 100 MCG/2ML IJ SOLN
100 MCG/2ML | Freq: Once | INTRAMUSCULAR | Status: DC
Start: 2017-04-23 — End: 2017-04-23

## 2017-04-23 MED ORDER — NALOXONE HCL 0.4 MG/ML IJ SOLN
0.4 MG/ML | INTRAMUSCULAR | Status: DC | PRN
Start: 2017-04-23 — End: 2017-04-27

## 2017-04-23 MED ORDER — FENTANYL CITRATE (PF) 100 MCG/2ML IJ SOLN
100 MCG/2ML | Freq: Once | INTRAMUSCULAR | Status: AC
Start: 2017-04-23 — End: 2017-04-24
  Administered 2017-04-24: 12:00:00 50 ug via INTRAVENOUS

## 2017-04-23 MED ORDER — MORPHINE SULFATE (PF) 4 MG/ML IV SOLN
4 MG/ML | INTRAVENOUS | Status: DC | PRN
Start: 2017-04-23 — End: 2017-04-27

## 2017-04-23 MED ORDER — ZALEPLON 10 MG PO CAPS
10 MG | Freq: Every evening | ORAL | Status: DC
Start: 2017-04-23 — End: 2017-04-23

## 2017-04-23 MED ORDER — MIDAZOLAM HCL 2 MG/2ML IJ SOLN
2 MG/ML | Freq: Once | INTRAMUSCULAR | Status: DC
Start: 2017-04-23 — End: 2017-04-23

## 2017-04-23 MED ORDER — MOMETASONE FURO-FORMOTEROL FUM 100-5 MCG/ACT IN AERO
100-5 MCG/ACT | Freq: Two times a day (BID) | RESPIRATORY_TRACT | Status: DC
Start: 2017-04-23 — End: 2017-04-23

## 2017-04-23 MED ORDER — SALINE MOUTHWASH
Status: DC | PRN
Start: 2017-04-23 — End: 2017-05-01

## 2017-04-23 MED ORDER — AZITHROMYCIN 250 MG PO TABS
250 MG | Freq: Once | ORAL | Status: AC
Start: 2017-04-23 — End: 2017-04-23
  Administered 2017-04-23: 23:00:00 500 mg via ORAL

## 2017-04-23 MED ORDER — POTASSIUM CHLORIDE CRYS ER 20 MEQ PO TBCR
20 MEQ | ORAL | Status: DC | PRN
Start: 2017-04-23 — End: 2017-04-28

## 2017-04-23 MED ORDER — AZITHROMYCIN 250 MG PO TABS
250 MG | Freq: Every day | ORAL | Status: DC
Start: 2017-04-23 — End: 2017-04-26
  Administered 2017-04-24 – 2017-04-25 (×2): 250 mg via ORAL

## 2017-04-23 MED ORDER — LORAZEPAM 2 MG/ML IJ SOLN
2 MG/ML | INTRAMUSCULAR | Status: DC | PRN
Start: 2017-04-23 — End: 2017-05-01

## 2017-04-23 MED ORDER — POTASSIUM CHLORIDE 20 MEQ PO PACK
20 MEQ | ORAL | Status: DC | PRN
Start: 2017-04-23 — End: 2017-04-28

## 2017-04-23 MED ORDER — OXYCODONE HCL 5 MG PO TABS
5 MG | ORAL | Status: DC | PRN
Start: 2017-04-23 — End: 2017-05-01
  Administered 2017-04-26 – 2017-05-01 (×9): 5 mg via ORAL

## 2017-04-23 MED ORDER — SENNOSIDES 8.6 MG PO TABS
8.6 MG | Freq: Every evening | ORAL | Status: DC | PRN
Start: 2017-04-23 — End: 2017-04-27

## 2017-04-23 MED ORDER — POLYETHYLENE GLYCOL 3350 17 G PO PACK
17 g | Freq: Every day | ORAL | Status: DC | PRN
Start: 2017-04-23 — End: 2017-05-01
  Administered 2017-04-29: 15:00:00 17 g via ORAL

## 2017-04-23 MED ORDER — POTASSIUM CHLORIDE 20 MEQ/50ML IV SOLN
20 MEQ/50ML | INTRAVENOUS | Status: DC | PRN
Start: 2017-04-23 — End: 2017-04-23

## 2017-04-23 MED ORDER — SALINE MOUTHWASH
Freq: Four times a day (QID) | Status: DC
Start: 2017-04-23 — End: 2017-05-01
  Administered 2017-04-24 – 2017-05-01 (×23): 15 mL via ORAL

## 2017-04-23 MED ORDER — NORMAL SALINE FLUSH 0.9 % IV SOLN
0.9 % | Freq: Two times a day (BID) | INTRAVENOUS | Status: DC
Start: 2017-04-23 — End: 2017-04-26
  Administered 2017-04-24 – 2017-04-26 (×5): 10 mL via INTRAVENOUS

## 2017-04-23 MED ORDER — SODIUM CHLORIDE 0.9 % IV SOLN (MINI-BAG)
0.9 % | Freq: Three times a day (TID) | INTRAVENOUS | Status: DC
Start: 2017-04-23 — End: 2017-04-26
  Administered 2017-04-23 – 2017-04-26 (×8): 1 g via INTRAVENOUS

## 2017-04-23 MED ORDER — MAGNESIUM SULFATE IN D5W 1-5 GM/100ML-% IV SOLN
1-5 GM/100ML-% | INTRAVENOUS | Status: DC | PRN
Start: 2017-04-23 — End: 2017-05-01

## 2017-04-23 MED ORDER — VALACYCLOVIR HCL 500 MG PO TABS
500 MG | Freq: Two times a day (BID) | ORAL | Status: DC
Start: 2017-04-23 — End: 2017-05-01
  Administered 2017-04-24 – 2017-05-01 (×16): 500 mg via ORAL

## 2017-04-23 MED ORDER — MIDAZOLAM HCL 2 MG/2ML IJ SOLN
2 MG/ML | Freq: Once | INTRAMUSCULAR | Status: AC
Start: 2017-04-23 — End: 2017-04-24
  Administered 2017-04-24: 12:00:00 4 mg via INTRAVENOUS

## 2017-04-23 MED ORDER — MORPHINE SULFATE 2 MG/ML IJ SOLN
2 MG/ML | INTRAMUSCULAR | Status: DC | PRN
Start: 2017-04-23 — End: 2017-04-27
  Administered 2017-04-23: 20:00:00 2 mg via INTRAVENOUS

## 2017-04-23 MED ORDER — MAGNESIUM SULFATE 4000 MG/100 ML IVPB PREMIX
4 GM/100ML | INTRAVENOUS | Status: DC | PRN
Start: 2017-04-23 — End: 2017-04-23

## 2017-04-23 MED ORDER — SODIUM CHLORIDE 0.9 % IV SOLN
0.9 % | INTRAVENOUS | Status: DC
Start: 2017-04-23 — End: 2017-05-01
  Administered 2017-04-23 – 2017-04-30 (×14): via INTRAVENOUS

## 2017-04-23 MED ORDER — SODIUM CHLORIDE 0.9 % IV BOLUS
0.9 % | Freq: Once | INTRAVENOUS | Status: AC
Start: 2017-04-23 — End: 2017-04-24
  Administered 2017-04-23: 23:00:00 250 mL via INTRAVENOUS

## 2017-04-23 MED ORDER — ACETAMINOPHEN 325 MG PO TABS
325 MG | ORAL | Status: DC | PRN
Start: 2017-04-23 — End: 2017-05-01
  Administered 2017-04-24: 03:00:00 650 mg via ORAL

## 2017-04-23 MED ORDER — ALTEPLASE 2 MG IJ SOLR
2 MG | INTRAMUSCULAR | Status: DC | PRN
Start: 2017-04-23 — End: 2017-05-01

## 2017-04-23 MED ORDER — POTASSIUM CHLORIDE 10 MEQ/100ML IV SOLN
10 MEQ/0ML | INTRAVENOUS | Status: DC | PRN
Start: 2017-04-23 — End: 2017-04-28

## 2017-04-23 MED FILL — SODIUM CHLORIDE 0.9 % IV SOLN: 0.9 % | INTRAVENOUS | Qty: 250

## 2017-04-23 MED FILL — MEROPENEM 1 G IV SOLR: 1 g | INTRAVENOUS | Qty: 1

## 2017-04-23 MED FILL — SODIUM CHLORIDE 0.9 % IV SOLN: 0.9 % | INTRAVENOUS | Qty: 1000

## 2017-04-23 MED FILL — MORPHINE SULFATE 2 MG/ML IJ SOLN: 2 mg/mL | INTRAMUSCULAR | Qty: 1

## 2017-04-23 MED FILL — AZITHROMYCIN 250 MG PO TABS: 250 MG | ORAL | Qty: 2

## 2017-04-23 MED FILL — GABAPENTIN 300 MG PO CAPS: 300 MG | ORAL | Qty: 1

## 2017-04-23 MED FILL — MOMETASONE FURO-FORMOTEROL FUM 100-5 MCG/ACT IN AERO: 100-5 MCG/ACT | RESPIRATORY_TRACT | Qty: 13

## 2017-04-23 NOTE — Care Coordination-Inpatient (Signed)
Type of Admission  Relapse Multiple Myeloma  History of [employer] Transplant 08/28/16        Central venous catheter          Plan          Update  04/23/17:  Admitted from [facility] office with possible relapse of multiple myeloma.          Education  04/23/17:  Greeting patient on arrival to unit, states that  [facility] Navigator assisted him in letting family know of his admission to office.        Discharge  To be determined by treatment plan        Pending

## 2017-04-23 NOTE — Plan of Care (Signed)
Problem: Bleeding:  Goal: Will show no signs and symptoms of excessive bleeding  Will show no signs and symptoms of excessive bleeding  Outcome: Met This Shift  Patient's hemoglobin this AM: 7.1 and Patient's platelet count this AM:5. Thrombocytopenia Precautions in place.  Patient showing no signs or symptoms of active bleeding.  Patient received transfusions per orders prior to this shift.  Patient verbalizes understanding of all instructions. Will continue to assess and implement POC. Call light within reach and hourly rounding in place.     Problem: Falls - Risk of:  Goal: Will remain free from falls  Will remain free from falls  Outcome: Met This Shift  Patient remained free of falls during shift. 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  Pt uses call light appropriately, ambulates with a standby assist. Will continue to encourage ambulation and implement POC. Call light within reach and hourly rounding in place.

## 2017-04-23 NOTE — H&P (Signed)
[facility] History and Physical       Attending Physician: David Salts, MD    Primary Care: David Canner, MD       Referring MD: David Salts, MD  [address] [place] [address]    Name: David Watkins DOB:  June 04, 1956  MRN:  [id number]    Admission: 04/23/17     Date: 04/23/2017    Reason for Admission: Cough, SOB, Evaluation of Acute Anemia & Thrombocytopenia    History of Present Illness:   David Watkins is a 61 yo gentleman with PMH HTN & HLD with oncologic history dating back to July 2017 when he was diagnosed with IgG Kappa Multiple Myeloma. Decision was made to start the pt on dexamethasone + radiation tx 03/20/16.  He received Rad Tx to T5-7, T10-L3, Right Scapula - 3000 cGy with Dr. Maceo Pro & Dr. Jamas Lav 03/20/16-04/01/16.  He also started systemic RVD 04/02/16 with monthly zometa.  He unfortunately developed rash with revlimid and was switched to PVD.  He tolerated this for 3 cycles until he developed SOB and rash which was attributed to pomalyst.  The pomalyst was stopped 06/17/16 and he received 1 more cycle of therapy with Velcade & Dex.  He was then admitted for high-dose melphalan chemotherapy followed by infusion of 2.36 x10^6 cd34cells/kg PBSCs on 08/28/16. He experienced expected cytopenias, fatigue, nausea, & diarrhea but otherwise recovered well from transplant.     Since transplant he has been followed expectantly and has been on maintenance revlimid 79m daily following desensitization ro revlimid in 12/2016. He has been feeling well up until about 3 weeks ago when he began to develop dry cough. This progressed to persistent coughing "24/7" per the pt. He has also become progressively weak & fatigued per pt. Can't walk more than 20 yards without becoming winded. He has no appetite and has eaten very little in the last week. He denies fevers, sweats, or chills. Has not had a BM in approx 3 days.    He will be admitted for w/u including evaluation of cough as well as  evaluation of thrombocytopenia. We will perform chest CT and start empiric IV abx after cultures are obtained. Will perform BM Bx/Asp tomorrow morning under sedation (per pt request) and will check serum & urine MM panels today. Plasmablasts present on peripheral blood smear with obvious concern for relapse/evolution of disease to plasma cell leukemia.      Past Surgical History:   Procedure Laterality Date   . BONE MARROW BIOPSY     . BONE MARROW TRANSPLANT     . OTHER SURGICAL HISTORY Left 08/17/2016    trifusion cath placement   . PRE-MALIGNANT / BENIGN SKIN LESION EXCISION Left 03/29/2017    EXCISE LESION LEFT EXTERNAL EAR WITH FROZEN SECTION, FULL THICKNESS SKIN GRAFT       Past Medical History:   Diagnosis Date   . Bone pain    . Hyperlipidemia    . Low back pain    . Multiple myeloma ([facility]    . Neuropathy     chemo induced, feet       Prior to Admission medications    Medication Sig Start Date End Date Taking? Authorizing Provider   NONFORMULARY     Historical Provider, MD   Fluticasone-Salmeterol (ADVAIR DISKUS IN) Inhale into the lungs    Historical Provider, MD   atorvastatin (LIPITOR) 10 MG tablet Take 10 mg by mouth daily  Historical Provider, MD   lenalidomide (REVLIMID) 10 MG chemo capsule Take 10 mg by mouth daily    Historical Provider, MD   aspirin 325 MG tablet Take 325 mg by mouth daily    Historical Provider, MD   gabapentin (NEURONTIN) 300 MG capsule Take 300 mg by mouth 4 times daily. Marland Kitchen    Historical Provider, MD   valACYclovir (VALTREX) 500 MG tablet Take 500 mg by mouth 2 times daily    Historical Provider, MD       Allergies   Allergen Reactions   . Lenalidomide Rash   . Pomalidomide Hives   . Ceclor [Cefaclor] Hives       Family History   Problem Relation Age of Onset   . Heart Disease Mother    . Lung Cancer Father    . Heart Attack Brother         Social History     Social History   . Marital status: Married     Spouse name: N/A   . Number of children: N/A   . Years of education: N/A      Occupational History   . Not on file.     Social History Main Topics   . Smoking status: Former Smoker     Quit date: 11/25/2012   . Smokeless tobacco: Never Used   . Alcohol use No   . Drug use: No   . Sexual activity: Not on file     Other Topics Concern   . Not on file     Social History Narrative   . No narrative on file        ROS   Constitutional: Denies fever, sweats. + wt loss. Overwhelming fatigue & weakness   Eyes: No visual changes or diplopia. No scleral icterus.   ENT: No Headaches, hearing loss or vertigo. No mouth sores or sore throat.   Cardiovascular: No chest pain, +dyspnea on exertion, no palpitations or loss of consciousness.    Respiratory: +dry cough, no wheezing, no sputum production. No hemoptysis.     Gastrointestinal: +RUQ pain, + appetite loss. No BM x3 days   Genitourinary: No dysuria, trouble voiding, or hematuria.   Musculoskeletal:  +generalized weakness. No joint complaints.   Integumentary: No rash or pruritis.   Neurological: No headache, diplopia. No change in gait, balance, or coordination. No paresthesias.   Endocrine: No temperature intolerance. No excessive thirst, fluid intake, or urination.    Hematologic/Lymphatic: +petechial rash to BLE's & trunk, no evidence of blood clots or swollen lymph nodes.   Allergic/Immunologic: No nasal congestion or hives.     Physical Exam:     Vital Signs:  There were no vitals taken for this visit.    Weight:    Wt Readings from Last 3 Encounters:   04/05/17 180 lb (81.6 kg)   03/29/17 189 lb 1.6 oz (85.8 kg)   03/23/17 190 lb (86.2 kg)         General: Awake, alert and oriented, Anxious  HEENT: normocephalic, PERRL, no scleral erythema or icterus, Oral mucosa with petechiae to soft palate, blood blister to right buccal mucosa  NECK: supple without palpable adenopathy  SKIN: warm dry and intact without lesions rashes or masses. +Scattered petechiae to trunk & BLE's  CHEST: Diminished LS's bilaterally, dry nonproductive  cough  CV: Normal S1 S2, RRR, no MRG  ABD: RUQ tenderness to palpation, ND normoactive BS, no palpable masses or hepatosplenomegaly  EXTREMITIES: without edema, denies calf  tenderness  NEURO: CN II - XII grossly intact  CATHETER: PIV    Laboratory Data:   Pending    PROBLEM LIST:      IgG Kappa Multiple myeloma  Peripheral neuropathy  Depression  Erectile dysfunction  Hyperlipidemia  Hypertension  Insomnia  Low back pain    TREATMENT:      1. Rad Tx to T5-7, T10-L3, Right Scapula - 3000 cGy - Dr. Jamas Lav 03/20/16-04/01/16  2. RVD x1 03/20/16 - discontinued d/t rash   3. Velcade/Pomalyst/Dex x3 cycles 04/23/16-06/17/16 - discontinued d/t reaction to pomalyst  4. Velcade/Dex x 1 cycle 06/26/16 (last dose of dexamethasone 07/22/16  5. High-dose melphalan followed by administration of PBSCs 2.36 x10^6 cd34cells/kg on 08/28/16  6. Maintenance Revlimid 37m daily (12/2016-04/23/17)    ASSESSMENT AND PLAN:     1.  IgG kappa multiple myeloma[facility] progressive anorexia, nonproductive cough, 17 pound wt loss, progressive weakness, DOE, & severe thrombocytopenia all concerning for disease relapse.  - h/o lytic lesions as well as cord compression at T6 and T11  - Initially PR after 5 cycles chemotherapy - M-spike is 0.4 & BM biopsy 2 % plasma cells prior to auto txp. Restaging post-transplant showed 1% plasma cells in the bone marrow, PET/CT 12/07/16 showed no hypermetabolic areas.  - Most recent MM studies 02/19/17: SPE w/monoclonal IgG kappa of 0.7 gm/dL, IgG 1910, SFLCs Kappa 65.8, Lambda 35.1, K/L 1.87.    - Hold maintenance revlimid & ASA at this time  - Serum & Urine MM panel 04/23/17 pending  - Plasmablasts on peripheral smear 04/23/17  - BM Bx/Asp scheduled for 04/24/17 at 0800 w/conscious sedation (Dr. EHunt Oristo perform at bedside)    2.  ID: Afebrile, but presenting with severe cough & SOB   - Blood Cx 04/23/17 pending. Procalcitonin, Resp cx (viral & bacterial), fungitel,  Galactomannan, CMV & EBV PCR, Strep Pneumo UrAg all pending on admission.  - Lactic Acid pending  - Start Merrem D+1 (04/23/17)  - Azithromycin D+1 (04/23/17)  - Check Chest CT for path vs ID etiology 04/23/17, pending    3.  Heme:  Anemia & thrombocytopenia, unknown etiology. Concern for disease relapse  - Transfuse for Hgb < 7 and Platelets < 10K  - Plt transfusion today    4.  Metabolic: Labs pending at time of H&P. Will check CMP. With dehydration POA, concern for ID issues, & possible relapse, suspect he will have AKI.   - Start IVF hydration: NS @ 1030mhr     5. Pulmonary: Chronic Bronchitis/COPD. H/o pulmonary nodule. Now presenting with cough & SOB  - Pulm Nodule: Monitor. Stable on Repeat CT chest 03/08/2017  - Cont albuterol PRN  - Cont anoro ellipta inhaler  - Repeat Chest CT 04/23/17, pending  - Codeine/Guiafensisin cough syrup q4h PRN    6. GI/Nutrition:  Appetite and oral intake is poor d/t critical illness. No BM x3 days  - Start low microbial diet   - Constipation: Miralax & Senna PRN    7. Acute Pain: Now w/acute pain to RUQ below ribs as well as to left chest rib pain  - Off all narcotics prior to admission  - Oxycodone PO & Morphine IVP PRN   - Previously followed with pain specialist as outpt  - Check amylase/lipase 04/23/17  - Check RUQ U/S with tenderness & pain to RUQ 04/23/17    8. Cardiac: h/o HLD. Septal wall defect on echocardiogram   - Lipitor on  hold for now  - Echo 07/13/16 w/abnormal (paradoxical) septal motion is present with preserved LVEF 55-60%.  - Telemetry on admission    9. Anxiety/Depression: Ongoing  - Ativan PRN    10. Insomnia: ongoing, has failed multiple sleep aids  - Cont sonata PRN HS    11. Peripheral Neuropathy: Ongoing  - Cont Gabapentin 300 mg TID     12. IV Access: PIV for now  - Depending work up and severity of illness, may need central access      - DVT Prophylaxis: Platelets <50,000 cells/dL - prophylactic lovenox on hold and mechanical prophylaxis with  bilateral SCDs while in bed in place.  Contraindications to pharmacologic prophylaxis: Thrombocytopenia  Contraindications to mechanical prophylaxis: None      - Disposition: Uncertain at this time. Pending pulmonary & heme-onc disease w/u.    The patient was seen and examined by Dr. Hunt Oris. This admission history and physical has been discussed and agreed upon by Dr. Hunt Oris.      Loma Newton, APRN - CNP    David Salts, MD  [facility]  [phone]

## 2017-04-23 NOTE — Progress Notes (Signed)
Patient admitted to [facility] via wheelchair from [facility] for diagnosis of relapse MM. Patient oriented to patient room including call light and bed controls.  Admission assessment completed - see admission flowsheet documentation.  Patient is a high fall risk.  Safety measures instituted per policy.    Psychosocial Distress screening completed, with a score of 4 or greater in Emotional category. The patient has declined a consult regarding their cause of distress.  Referral has not been placed to  Social, spiritual services.  A request for psychological consult has not been placed to the attending physician.     Patient oriented to unit policies and procedures including: pain management practices, unit safety precautions, family rapid response, q4h vital signs and assessments, daily 4am lab draws, weekly chest x-rays, weekly VRE rectal swabs for surveillance, daily chlorhexidine bathing, standing transfusion orders, and routine central line care.  Also discussed use of call light and how to get in touch with nursing staff.  Stressed the importance of calling out immediately for any changes in condition including but not limited to: pain, chills, fever, nausea, vomiting, diarrhea, chest pain, sob/doe, assistance with toileting, bleeding, or any other symptoms that are out of the ordinary for the patient.  Patient verbalizes understanding of all instructions and will call for assistance as needed.

## 2017-04-23 NOTE — Progress Notes (Signed)
RESPIRATORY THERAPY ASSESSMENT    Name:  [place] Record Number:  [id number]  Age: 61 y.o.   Gender: male  DOB: 1956/01/13  Today's Date:  04/23/2017  Room:  3527/3527-01    Assessment     Is the patient being admitted for a COPD or Asthma exacerbation?  No   (If yes the patient will be seen every 4 hours for the first 24 hours and then reassessed)    Patient Admission Diagnosis  Multiple myeloma       Allergies  Allergies   Allergen Reactions   ??? Lenalidomide Rash   ??? Pomalidomide Hives   ??? Ceclor [Cefaclor] Hives       Minimum Predicted Vital Capacity:     7893           Actual Vital Capacity:      1000          Pulmonary History:COPD  Home Oxygen Therapy:  room air  Home Respiratory Therapy:Mometasone/Formoterol   Current Respiratory Therapy:  Dulera   Treatment Type: IS       Respiratory Severity Index(RSI)   Patients with orders for inhalation medications, oxygen, or any therapeutic treatment modality will be placed on Respiratory Protocol.  They will be assessed with the first treatment and at least every 72 hours thereafter.  The following severity scale will be used to determine frequency of treatment intervention.    Smoking History: Pulmonary Disease or Smoking History, Greater than 15 pack year = 2    Social History  Social History   Substance Use Topics   ??? Smoking status: Former Smoker     Quit date: 11/25/2012   ??? Smokeless tobacco: Never Used   ??? Alcohol use No       Recent Surgical History: None = 0  Past Surgical History  Past Surgical History:   Procedure Laterality Date   ??? BONE MARROW BIOPSY     ??? BONE MARROW TRANSPLANT     ??? OTHER SURGICAL HISTORY Left 08/17/2016    trifusion cath placement   ??? PRE-MALIGNANT / BENIGN SKIN LESION EXCISION Left 03/29/2017    EXCISE LESION LEFT EXTERNAL EAR WITH FROZEN SECTION, FULL THICKNESS SKIN GRAFT       Level of Consciousness: Alert, Oriented, and Cooperative = 0    Level of Activity: Walking unassisted = 0    Respiratory Pattern: Regular Pattern;  RR 8-20 = 0    Breath Sounds: Diminished unilaterally = 1    Sputum   ,  ,    Cough: Strong, spontaneous, non-productive = 0    Vital Signs   BP 128/77    Pulse 87    Temp 98.1 ??F (36.7 ??C) (Oral)    Resp 18    SpO2 95%   SPO2 (COPD values may differ): Greater than or equal to 92% on room air = 0    Peak Flow (asthma only): not applicable = 0    RSI: 0-4 = See once and convert to home regimen or discontinue        Plan       Goals: medication delivery, mobilize retained secretions, volume expansion and improve oxygenation    Patient/caregiver was educated on the proper method of use for Respiratory Care Devices:  Yes      Level of patient/caregiver understanding able to:   _0  Verbalize understanding   _1  Demonstrate understanding       _2  Teach back        _3   Needs reinforcement       _0   No available caregiver               _1   Other:     Response to education:  Good     Is patient being placed on Home Treatment Regimen?  Yes     Does the patient have everything they need prior to discharge?  NA     Comments: reviewed chart and assessed patient     Plan of Care: Waiting for pharmacy to change medications    Electronically signed by Rosebud Poles, RCP on 04/23/2017 at 4:40 PM    Respiratory Protocol Guidelines     1. Assessment and treatment by Respiratory Therapy will be initiated for medication and therapeutic interventions upon initiation of aerosolized medication.  2. Physician will be contacted for respiratory rate (RR) greater than 35 breaths per minute. Therapy will be held for heart rate (HR) greater than 140 beats per minute, pending direction from physician.  3. Bronchodilators will be administered via Metered Dose Inhaler (MDI) with spacer when the following criteria are met:  a. Alert and cooperative     b. HR < 140 bpm  c. RR < 30 bpm                d. Can demonstrate a 2-3 second inspiratory hold  4. Bronchodilators will be administered via Hand Held Nebulizer [facility]) to patients when ANY of the  following criteria are met  a. Incognizant or uncooperative          b. Patients treated with HHN at Home        c. Unable to demonstrate proper use of MDI with spacer     d. RR > 30 bpm   5. Bronchodilators will be delivered via Metered Dose Inhaler (MDI), HHN, Aerogen to intubated patients on mechanical ventilation.  6. Inhalation medication orders will be delivered and/or substituted as outlined below.    Aerosolized Medications Ordering and Administration Guidelines:    1. All Medications will be ordered by a physician, and their frequency and/or modality will be adjusted as defined by the patients Respiratory Severity Index (RSI) score.  2. If the patient does not have documented COPD, consider discontinuing anticholinergics when RSI is less than 9.  3. If the bronchospasm worsens (increased RSI), then the bronchodilator frequency can be increased to a maximum of every 4 hours.  If greater than every 4 hours is required, the physician will be contacted.  4. If the bronchospasm improves, the frequency of the bronchodilator can be decreased, based on the patient's RSI, but not less than home treatment regimen frequency.  5. Bronchodilator(s) will be discontinued if patient has a RSI less than 9 and has received no scheduled or as needed treatment for 72  Hrs.    Patients Ordered on a Mucolytic Agent:    1. Must always be administered with a bronchodilator.    2. Discontinue if patient experiences worsened bronchospasm, or secretions have lessened to the point that the patient is able to clear them with a cough.    Anti-inflammatory and Combination Medications:    1. If the patient lacks prior history of lung disease, is not using inhaled anti-inflammatory medication at home, and lacks wheezing by examination or by history for at least 24 hours, contact physician for possible discontinuation.

## 2017-04-23 NOTE — Progress Notes (Signed)
4 Eyes Admission Assessment     I agree as the admission nurse that 2 RN's have performed a thorough Head to Toe Skin Assessment on the patient. ALL assessment sites listed below have been assessed on admission.       Areas assessed by both nurses:   [x]    Head, Face, and Ears   [x]    Shoulders, Back, and Chest  [x]    Arms, Elbows, and Hands   [x]    Coccyx, Sacrum, and Ischum  [x]    Legs, Feet, and Heels        Does the Patient have Skin Breakdown?  No         Braden Prevention initiated:  No   Wound Care Orders initiated:  No      WOC nurse consulted for Pressure Injury (Stage 3,4, Unstageable, DTI, NWPT, and Complex wounds):  No      Nurse 1 eSignature: Electronically signed by Myrtice Lauth, RN on 04/23/17 at 7:12 PM    **SHARE this note so that the co-signing nurse is able to place an eSignature**    Nurse 2 eSignature: Electronically signed by Durenda Hurt, RN on 04/23/17 at 7:31 PM

## 2017-04-24 LAB — CBC WITH AUTO DIFFERENTIAL
Atypical Lymphocytes Relative: 13 % — ABNORMAL HIGH (ref 0–6)
Bands Relative: 4 % (ref 0–7)
Basophils %: 0 %
Basophils Absolute: 0 10*3/uL (ref 0.0–0.2)
Blasts Relative: 13 % — AB
Eosinophils %: 3 %
Eosinophils Absolute: 0.1 10*3/uL (ref 0.0–0.6)
Hematocrit: 17.7 % — CL (ref 40.5–52.5)
Hemoglobin: 6 g/dL — CL (ref 13.5–17.5)
Lymphocytes %: 36 %
Lymphocytes Absolute: 1.2 10*3/uL (ref 1.0–5.1)
MCH: 29.5 pg (ref 26.0–34.0)
MCHC: 34 g/dL (ref 31.0–36.0)
MCV: 86.7 fL (ref 80.0–100.0)
MPV: 6.9 fL (ref 5.0–10.5)
Metamyelocytes Relative: 2 % — AB
Monocytes %: 4 %
Monocytes Absolute: 0.1 10*3/uL (ref 0.0–1.3)
Myelocyte Percent: 3 % — AB
Neutrophils %: 22 %
Neutrophils Absolute: 0.7 10*3/uL — CL (ref 1.7–7.7)
Platelets: 33 10*3/uL — ABNORMAL LOW (ref 135–450)
RBC: 2.04 M/uL — ABNORMAL LOW (ref 4.20–5.90)
RDW: 17 % — ABNORMAL HIGH (ref 12.4–15.4)
WBC: 2.4 10*3/uL — ABNORMAL LOW (ref 4.0–11.0)
nRBC: 4 /100 WBC — AB

## 2017-04-24 LAB — PHOSPHORUS: Phosphorus: 5.8 mg/dL — ABNORMAL HIGH (ref 2.5–4.9)

## 2017-04-24 LAB — HEPATIC FUNCTION PANEL
ALT: 44 U/L — ABNORMAL HIGH (ref 10–40)
AST: 68 U/L — ABNORMAL HIGH (ref 15–37)
Albumin: 2.6 g/dL — ABNORMAL LOW (ref 3.4–5.0)
Alkaline Phosphatase: 167 U/L — ABNORMAL HIGH (ref 40–129)
Bilirubin, Direct: 0.3 mg/dL (ref 0.0–0.3)
Bilirubin, Indirect: 0.8 mg/dL (ref 0.0–1.0)
Total Bilirubin: 1.1 mg/dL — ABNORMAL HIGH (ref 0.0–1.0)
Total Protein: 11.5 g/dL — ABNORMAL HIGH (ref 6.4–8.2)

## 2017-04-24 LAB — BASIC METABOLIC PANEL
Anion Gap: 10 (ref 3–16)
BUN: 20 mg/dL (ref 7–20)
CO2: 21 mmol/L (ref 21–32)
Calcium: 8.7 mg/dL (ref 8.3–10.6)
Chloride: 101 mmol/L (ref 99–110)
Creatinine: 1.5 mg/dL — ABNORMAL HIGH (ref 0.9–1.3)
GFR African American: 58 — AB (ref >60)
GFR Non-African American: 48 — AB (ref >60)
Glucose: 94 mg/dL (ref 70–99)
Potassium: 4.1 mmol/L (ref 3.5–5.1)
Sodium: 132 mmol/L — ABNORMAL LOW (ref 136–145)

## 2017-04-24 LAB — TYPE AND SCREEN
ABO/Rh: O POS
Antibody Screen: NEGATIVE

## 2017-04-24 LAB — LACTATE DEHYDROGENASE: LD: 711 U/L — ABNORMAL HIGH (ref 100–190)

## 2017-04-24 LAB — PROCALCITONIN: Procalcitonin: 1.53 ng/mL — ABNORMAL HIGH (ref 0.00–0.15)

## 2017-04-24 LAB — URIC ACID: Uric Acid, Serum: 5.7 mg/dL (ref 3.5–7.2)

## 2017-04-24 LAB — SPECIMEN REJECTION

## 2017-04-24 LAB — STREP PNEUMONIAE ANTIGEN: STREP PNEUMONIAE ANTIGEN, URINE: NEGATIVE

## 2017-04-24 LAB — APTT: aPTT: 32.7 s (ref 26.0–36.0)

## 2017-04-24 MED ORDER — BUDESONIDE 0.25 MG/2ML IN SUSP
0.252 MG/2ML | Freq: Two times a day (BID) | RESPIRATORY_TRACT | Status: DC
Start: 2017-04-24 — End: 2017-05-01
  Administered 2017-04-24 – 2017-05-01 (×15): 250 mg via RESPIRATORY_TRACT

## 2017-04-24 MED ORDER — DEXAMETHASONE 4 MG PO TABS
4 MG | Freq: Every day | ORAL | Status: AC
Start: 2017-04-24 — End: 2017-04-29
  Administered 2017-04-24 – 2017-04-29 (×6): 40 mg via ORAL

## 2017-04-24 MED ORDER — SODIUM CHLORIDE 0.9 % IV BOLUS
0.9 % | Freq: Once | INTRAVENOUS | Status: AC
Start: 2017-04-24 — End: 2017-04-24
  Administered 2017-04-24: 15:00:00 250 mL via INTRAVENOUS

## 2017-04-24 MED ORDER — PANTOPRAZOLE SODIUM 40 MG PO TBEC
40 MG | Freq: Every day | ORAL | Status: DC
Start: 2017-04-24 — End: 2017-05-01
  Administered 2017-04-24 – 2017-05-01 (×8): 40 mg via ORAL

## 2017-04-24 MED ORDER — ALBUTEROL SULFATE (2.5 MG/3ML) 0.083% IN NEBU
Freq: Four times a day (QID) | RESPIRATORY_TRACT | Status: DC
Start: 2017-04-24 — End: 2017-05-01
  Administered 2017-04-24 – 2017-05-01 (×28): 2.5 mg via RESPIRATORY_TRACT

## 2017-04-24 MED ORDER — ZOLPIDEM TARTRATE 5 MG PO TABS
5 MG | Freq: Every evening | ORAL | Status: DC
Start: 2017-04-24 — End: 2017-05-01
  Administered 2017-04-24 – 2017-05-01 (×8): 5 mg via ORAL

## 2017-04-24 MED FILL — ALBUTEROL SULFATE (2.5 MG/3ML) 0.083% IN NEBU: RESPIRATORY_TRACT | Qty: 3

## 2017-04-24 MED FILL — GABAPENTIN 300 MG PO CAPS: 300 MG | ORAL | Qty: 1

## 2017-04-24 MED FILL — OXYCODONE HCL 5 MG PO TABS: 5 MG | ORAL | Qty: 2

## 2017-04-24 MED FILL — BUDESONIDE 0.25 MG/2ML IN SUSP: 0.25 MG/2ML | RESPIRATORY_TRACT | Qty: 2

## 2017-04-24 MED FILL — GUAIFENESIN-CODEINE 100-10 MG/5ML PO SOLN: 100-10 MG/5ML | ORAL | Qty: 10

## 2017-04-24 MED FILL — SODIUM CHLORIDE 0.9 % IV SOLN: 0.9 % | INTRAVENOUS | Qty: 1000

## 2017-04-24 MED FILL — SODIUM CHLORIDE 0.9 % IV SOLN: 0.9 % | INTRAVENOUS | Qty: 250

## 2017-04-24 MED FILL — FENTANYL CITRATE (PF) 100 MCG/2ML IJ SOLN: 100 MCG/2ML | INTRAMUSCULAR | Qty: 2

## 2017-04-24 MED FILL — MEROPENEM 1 G IV SOLR: 1 g | INTRAVENOUS | Qty: 1

## 2017-04-24 MED FILL — ZOLPIDEM TARTRATE 5 MG PO TABS: 5 MG | ORAL | Qty: 1

## 2017-04-24 MED FILL — MIDAZOLAM HCL 2 MG/2ML IJ SOLN: 2 MG/ML | INTRAMUSCULAR | Qty: 6

## 2017-04-24 MED FILL — PANTOPRAZOLE SODIUM 40 MG PO TBEC: 40 MG | ORAL | Qty: 1

## 2017-04-24 MED FILL — DEXAMETHASONE 4 MG PO TABS: 4 MG | ORAL | Qty: 10

## 2017-04-24 MED FILL — ACETAMINOPHEN 325 MG PO TABS: 325 MG | ORAL | Qty: 2

## 2017-04-24 MED FILL — VALACYCLOVIR HCL 500 MG PO TABS: 500 MG | ORAL | Qty: 1

## 2017-04-24 MED FILL — AZITHROMYCIN 250 MG PO TABS: 250 MG | ORAL | Qty: 1

## 2017-04-24 NOTE — Procedures (Signed)
Bone Marrow Core & Aspirate with Moderate Sedation    Diagnosis: Pancytopenia  Indication: Same  Planned Sedation:  Fentanyl: 50 mcg  Versed: 4 mg  Ativan: 0  Pre-Procedure ASA Score: Class II - mild to moderate systemic disturbance, well-controlled  Airway Assessment Score: I (soft palate, uvula, fauces, tonsillar pillars visible)    Consent:   Consent was obtained from the patient by:   1. Explaining the risks associated with biopsy [bleeding and bruising] and moderate sedation [respiratory depression and sedation], as well as the benefits [an understanding of the current diagnosis to make treatment decisions], and alternatives to biopsy with sedation.  2. The patient voiced an understanding of the risks/benefits and the answers to their questions, then proceeded to sign and date the consent form.     Assessment:  Patient was assessed by myself prior to initiation of sedation and procedure and is deemed medically fit to undergo the procedure.  Recent H&P, current medications, and allergies are on the chart and reviewed.  Time out performed.    Procedure:  Patient placed in the left lateral decubitus position.  The area was prepped with chloraprep. Sterile drapes were applied. A puncture was made with the provided scalpel, then using a l needle a bone marrow aspirate was taken from the right posterior iliac crest.  Then using an 8 G 6" needle a core biopsy was obtained. A sterile bandage was applied.  The patient had no significant bleeding.  The sample was sent for histology, flow cytometry and cytogenetics.     Patient tolerated well, no complications. Patient may be discharged from outpt oncology following procedure once Aldrete Score is at least 8 or meets pre-procedure Aldrete Score.  Nursing to notify physician if pt doesn't meet criteria for discharge.    Nikki Dom Felita Bump  [facility]  [phone]

## 2017-04-24 NOTE — Progress Notes (Signed)
Patient underwent bone biopsy this am on right illiac crest.  Bandaged with no bleeding or drainage noted. Patient under conscious sedation during procedure.  Tolerated well.  Groggy throughout the morning. Bed alarm in use. Call light within reach. Vital signs stable.  Appetite increased in afternoon.  Patient ate at least 75% of meal trays. Will continue to monitor.

## 2017-04-24 NOTE — Plan of Care (Signed)
Problem: Bleeding:  Goal: Will show no signs and symptoms of excessive bleeding  Will show no signs and symptoms of excessive bleeding   Outcome: Ongoing      Problem: Falls - Risk of:  Goal: Will remain free from falls  Will remain free from falls    Outcome: Ongoing  Orthostatic vital signs obtained at start of shift - see flowsheet for details.  Pt meets criteria for orthostasis.  Pt is a High fall risk. See Leamon Arnt Fall Score and ABCDS Injury Risk assessments.   + Screening for Orthostasis and/or + High Fall Risk per MORSE/ABCDS: Explained fall risk precautions to pt and family and rationale behind their use to keep the patient safe. Pt bed is in low position, side rails up, call light and belongings are in reach. Fall wristband applied and present on pts wrist.  Bed alarm on.  Pt encouraged to call for assistance. Will continue with hourly rounds for PO intake, pain needs, toileting and repositioning as needed.       Problem: Pain:  Goal: Pain level will decrease  Pain level will decrease   Outcome: Ongoing

## 2017-04-24 NOTE — Progress Notes (Signed)
Nutrition Assessment    Type and Reason for Visit: Initial, Positive Nutrition Screen    Nutrition Recommendations:    Monitor ability to advance diet after procedure    Obtain an actual wt     Malnutrition Assessment:   Malnutrition Status: At risk for malnutrition   Context: Acute illness or injury   Findings of the 6 clinical characteristics of malnutrition (Minimum of 2 out of 6 clinical characteristics is required to make the diagnosis of moderate or severe Protein Calorie Malnutrition based on AND/ASPEN Guidelines):  1. Energy Intake-Less than or equal to 50%, greater than 7 days    2. Weight Loss- , unable to assess (wt loss noted from 7/30 to 8/6; no CBW assess wt status)  3. Fat Loss-Unable to assess,    4. Muscle Loss-Unable to assess,    5. Fluid Accumulation-No significant fluid accumulation,    6. Grip Strength-Not measured    Nutrition Diagnosis:    Problem: Inadequate oral intake   Etiology: related to  (acute illness)    . Signs and symptoms:  as evidenced by Diet history of poor intake    Nutrition Assessment:   Subjective Assessment: Positive nutrition screen for wt loss & poor PO intake. PMHx HTN, HLD, MM admitted w/ relapse MM. Pt to have BM bx this am. NPO for procedure. Noted pt w/ no appetite reports x1 week. No BM x3 days, noted PRN bowel regimen ordered. Per previous admissions pt dislikes ONS. No CBW on file. UBW ~200# (2017) noted wt of 189# (03/29/2017) to 180# (8/6) which shows 4.7% loss from 1 week. Will request CBW to assess wt status.      Nutrition-Focused Physical Findings: no BM x3 days +PRN bowel regimen ordered; Petechiae   Wound Type: None   Current Nutrition Therapies:   Oral Diet Orders: NPO    Oral Diet intake: NPO   Oral Nutrition Supplement (ONS) Orders: None   ONS intake: NPO   Anthropometric Measures:   Ht: 5\' 9"  (175.3 cm)    Current Body Wt:  (no wt on file this admission)   % Weight Change: 4.7% loss,  1 week from 189 lb 7/30 to 180 lb 8/6. No CBW  at this time   BMI Classification:  (UTA no CBW)   Comparative Standards (Estimated Nutrition Needs): UTA w/out CBW   Estimated Daily Total Kcal:     Estimated Daily Protein (g):      Estimated Intake vs Estimated Needs: Intake Less Than Needs    Nutrition Risk Level: High    Nutrition Interventions:   Start oral diet (after procedure)  Continued Inpatient Monitoring    Nutrition Evaluation:    Evaluation: Goals set    Goals: pt will tolerate diet and consume >75% of meals offered     Monitoring: Diet Progression, Meal Intake, Constipation, Weight    See Adult Nutrition Doc Flowsheet for more detail.     Electronically signed by Everitt Amber, RD, LD on 04/24/17 at 11:11 AM    Contact Number:   Pager: [phone]  Office: [phone]

## 2017-04-24 NOTE — Progress Notes (Signed)
[facility]  Autologous Progress Note    04/24/2017    David Watkins    DOB:  1956-01-23    MRN:  [id number]    Referring MD: Harlene [place], [place] [address]      Subjective:  Slept well last night.  Still very weak.       ECOG PS:  (2) Ambulatory and capable of self care, unable to carry out work activity, up and about > 50% or waking hours    Isolation:  None     Medications    Scheduled Meds:  . sodium chloride  250 mL Intravenous Once   . Saline Mouthwash  15 mL Swish & Spit 4x Daily AC & HS   . sodium chloride flush  10 mL Intravenous 2 times per day   . azithromycin  250 mg Oral Daily   . meropenem  1 g Intravenous Q8H   . fentanNYL  100 mcg Intravenous Once   . midazolam  5 mg Intravenous Once   . gabapentin  300 mg Oral TID   . valACYclovir  500 mg Oral BID   . zolpidem  5 mg Oral Nightly   . budesonide  0.25 mg Nebulization BID   . albuterol  2.5 mg Nebulization 4x daily     Continuous Infusions:  . sodium chloride 100 mL/hr at 04/24/17 0421     PRN Meds:.alteplase, magnesium hydroxide, Saline Mouthwash, naloxone, acetaminophen, magnesium sulfate, potassium chloride **OR** potassium chloride **OR** potassium chloride, oxyCODONE **OR** oxyCODONE, morphine **OR** morphine, LORazepam **OR** LORazepam, guaiFENesin-codeine, senna, polyethylene glycol    ROS:   Constitutional: Denies fever, sweats, weight loss.    Eyes: No visual changes or diplopia. No scleral icterus.   ENT: No Headaches, hearing loss or vertigo. No mouth sores or sore throat.   Cardiovascular: No chest pain, dyspnea on exertion, palpitations or loss of consciousness.    Respiratory: No cough or wheezing, no sputum production. No hemoptysis.   Gastrointestinal: No abdominal pain, appetite loss, blood in stools. No change in bowel habits.   Genitourinary: No dysuria, trouble voiding, or hematuria.   Musculoskeletal:  No generalized weakness. No joint complaints.   Integumentary: No rash or  pruritis.   Neurological: No headache, diplopia. No change in gait, balance, or coordination. No paresthesias.   Endocrine: No temperature intolerance. No excessive thirst, fluid intake, or urination.    Hematologic/Lymphatic: No abnormal bruising or ecchymoses, blood clots or swollen lymph nodes.   Allergic/Immunologic: No nasal congestion or hives.     Physical Exam:     I&O:    Intake/Output Summary (Last 24 hours) at 04/24/17 0748  Last data filed at 04/24/17 0606   Gross per 24 hour   Intake             1790 ml   Output              450 ml   Net             1340 ml       Vital Signs:  BP 113/72   Pulse 88   Temp 97.9 F (36.6 C) (Oral)   Resp 16   Ht 5' 9"  (1.753 m)   SpO2 95%   BMI 26.58 kg/m     Weight:    Wt Readings from Last 3 Encounters:   04/05/17 180 lb (81.6 kg)   03/29/17 189 lb 1.6 oz (85.8 kg)  03/23/17 190 lb (86.2 kg)       General: Awake, alert and oriented   HEENT: normocephalic, alopecia, PERRL, no scleral erythema or icterus, Oral mucosa moist and intact, throat clear.   NECK: supple without palpable adenopathy  BACK: Straight, negative CVAT  SKIN: warm dry and intact without lesions rashes or masses  CHEST: CTA bilaterally without use of accessory muscles.  Very tender right anterior costal margin  CV: Normal S1 S2, RRR, no MRG  ABD: NT ND normoactive BS, no palpable masses or hepatosplenomegaly  EXTREMITIES: without edema, denies calf tenderness  NEURO: CN II - XII grossly intact  CATHETER: PIV    Data:   CBC: Recent Labs      04/24/17   0418   WBC  2.4*   HGB  6.0*   HCT  17.7*   MCV  86.7   PLT  33*     BMP/Mag:  Recent Labs      04/23/17   1614  04/24/17   0418   NA  131*  132*   K  4.1  4.1   CL  99  101   CO2  20*  21   PHOS  5.0*  5.8*   BUN  21*  20   CREATININE  1.4*  1.5*   MG  2.00   --      LIVP:   Recent Labs      04/23/17   1610  04/23/17   1614  04/24/17   0418   AST   --   88*  68*   ALT   --   59*  44*   LIPASE  16.0   --    --    BILIDIR   --   <0.2  0.3    BILITOT   --   1.0  1.1*   ALKPHOS   --   187*  167*     Uric Acid:    Recent Labs      04/24/17   0418   LABURIC  5.7     Coags:   Recent Labs      04/24/17   0418   APTT  32.7       Pending    PROBLEM LIST:      IgG Kappa Multiple myeloma  Peripheral neuropathy  Depression  Erectile dysfunction  Hyperlipidemia  Hypertension  Insomnia  Low back pain    TREATMENT:      1. Rad Tx to T5-7, T10-L3, Right Scapula - 3000 cGy - Dr. Jamas Lav 03/20/16-04/01/16  2. RVD x1 03/20/16 - discontinued d/t rash   3. Velcade/Pomalyst/Dex x3 cycles 04/23/16-06/17/16 - discontinued d/t reaction to pomalyst  4. Velcade/Dex x 1 cycle 06/26/16 (last dose of dexamethasone 07/22/16  5. High-dose melphalan followed by administration of PBSCs 2.36 x10^6 cd34cells/kg on 08/28/16  6. Maintenance Revlimid 64m daily (12/2016-04/23/17)    ASSESSMENT AND PLAN:     1. IgG kappa [facility] myeloma: [facility] progressive anorexia, nonproductive cough, 17 pound wt loss, progressive weakness, DOE, & severe thrombocytopenia all concerning for disease relapse.  - h/o lytic lesions as well as cord compression at T6 and T11  - Initially PR after 5 cycles chemotherapy - M-spike is 0.4 &BM biopsy 2 % plasma cells prior to auto txp. Restaging post-transplant showed 1% plasma cells in the bone marrow, PET/CT 12/07/16 showed no hypermetabolic areas.  - Most recent MM studies 02/19/17: SPE w/monoclonal IgG kappa of  0.7 gm/dL, IgG 1910, SFLCs Kappa 65.8, Lambda 35.1, K/L 1.87.    - Hold maintenance revlimid & ASA at this time  - Serum & Urine MM panel 04/23/17 pending  - Plasmablasts on peripheral smear 04/23/17  - BM Bx/Asp 04/24/17     2. ID: Afebrile, but presenting with severe cough & SOB  - Blood Cx 04/23/17 pending. Procalcitonin, Resp cx (viral & bacterial), fungitel, Galactomannan, CMV & EBV PCR, Strep Pneumo UrAg all pending on admission.  - Lactic Acid pending  - Start Merrem D+3 (04/23/17)  - Azithromycin D+3  (04/23/17)      3. Heme: Anemia & thrombocytopenia, unknown etiology. Concern for disease relapse  - Transfuse for Hgb < 7 and Platelets < 10K  - Plt transfusion today    4. Metabolic: Labs pending at time of H&P. Will check CMP. With dehydration POA, concern for ID issues, & possible relapse, suspect he will have AKI.   - Start IVF hydration: NS @ 151m/hr    5. Pulmonary: Chronic Bronchitis/COPD. H/o pulmonary nodule.   - Pulm Nodule: Monitor. Stable on Repeat CT chest 03/08/2017  - Cont albuterol PRN  - Cont anoro ellipta inhaler  - Repeat Chest CT 04/23/17, pending  - Codeine/Guiafensisin cough syrup q4h PRN    1. Redemonstration of multiple lytic lesions throughout the skeleton compatible with patient's history of multiple myeloma.   2. 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 Stable scattered submillimeter pulmonary parenchymal nodules.   4. Marked centrilobular emphysematous changes with upper lobe predominance, stable.   5. Progression of splenomegaly         6. GI/Nutrition: Appetite and oral intake is poor d/t critical illness. No BM x3 days  - Start low microbial diet   - Constipation: Miralax & Senna PRN    7. Acute Pain: Now w/acute pain to RUQ below ribs as well as to left chest rib pain  - Off all narcotics prior to admission  - Oxycodone PO & Morphine IVP PRN   - Previously followed with pain specialist as outpt  - Check amylase/lipase 04/23/17  - Check RUQ U/S with tenderness & pain to RUQ 04/23/17    8. Cardiac: h/o HLD. Septal wall defect on echocardiogram   - Lipitor on hold for now  - Echo 07/13/16 w/abnormal (paradoxical) septal motion is present with preserved LVEF 55-60%.  - Telemetry on admission    9. Anxiety/Depression: Ongoing  - Ativan PRN    10. Insomnia: ongoing, has failed multiple sleep aids  - Cont sonata/Ambien PRN HS    11. Peripheral Neuropathy: Ongoing  - Cont  Gabapentin 300 mg TID     12. IV Access: PIV for now  - Depending work up and severity of illness, may need central access      - DVT Prophylaxis: Platelets <50,000 cells/dL - prophylactic lovenox on hold and mechanical prophylaxis with bilateral SCDs while in bed in place.  Contraindications to pharmacologic prophylaxis: Thrombocytopenia  Contraindications to mechanical prophylaxis: None      - Disposition: Uncertain at this time. Pending pulmonary & heme-onc disease w/u.      JHarlene Salts MD

## 2017-04-24 NOTE — Plan of Care (Signed)
Problem: Nutrition  Goal: Optimal nutrition therapy  Outcome: Ongoing  Nutrition Problem: Inadequate oral intake  Intervention: Food and/or Nutrient Delivery: Start oral diet (after procedure)  Nutritional Goals: pt will tolerate diet and consume >75% of meals offered

## 2017-04-24 NOTE — Progress Notes (Signed)
ETCO2  Monitoring done for conscious sedation.  Patient is on 2 liters/min via nasal cannula for procedure.    Baseline information:  HR: 95 BP: 121/72  RR: 20 LOC: alert  ETCO2: 25 SpO2: 96%    5 minutes after sedation administered:  HR: 87 BP: 129/73  RR: 16 LOC: lethargic  ETCO2: 18 SpO2: 96%    Post-Procedure:  HR: 84 BP: 101/63  RR: 16 LOC: alert  ETCO2: 28 SpO2: 98%  well    Patient/caregiver was educated on the proper method of use:  Yes      Level of patient/caregiver understanding able to:   [x]  Verbalize understanding   []  Demonstrate understanding       []  Teach back        []  Needs reinforcement       []   No available caregiver               []   Other:     Response to education:  Excellent

## 2017-04-24 NOTE — Plan of Care (Signed)
Problem: Bleeding:  Goal: Will show no signs and symptoms of excessive bleeding  Will show no signs and symptoms of excessive bleeding   Outcome: Ongoing  Patient's hemoglobin this AM:   Recent Labs      04/24/17   0418   HGB  6.0*     Patient's platelet count this AM:   Recent Labs      04/24/17   0418   PLT  33*    Thrombocytopenia Precautions in place.  Patient showing no signs or symptoms of active bleeding.  Patient transfused blood products per orders - see flowsheet.  Patient verbalizes understanding of all instructions. Will continue to assess and implement POC. Call light within reach and hourly rounding in place.     Problem: Falls - Risk of:  Goal: Will remain free from falls  Will remain free from falls    Outcome: Ongoing  No falls reported on shift. Patient on bed alarm. Compliant and able to make needs known. Will continue to monitor.     Problem: Pain:  Goal: Pain level will decrease  Pain level will decrease   Outcome: Ongoing  On conscious sedation for bone marrow biopsy this am. Patient sleeping throughout morning. Patient complained of no pain throughout shift after. Will continue to monitor.       Problem: Nutrition  Goal: Optimal nutrition therapy  Outcome: Ongoing  Patient ate around 75% or more of meals. Patient able to make needs known. Will continue to monitor.

## 2017-04-25 LAB — CBC WITH AUTO DIFFERENTIAL
Atypical Lymphocytes Relative: 4 % (ref 0–6)
Bands Relative: 6 % (ref 0–7)
Basophils %: 0 %
Basophils Absolute: 0 10*3/uL (ref 0.0–0.2)
Blasts Relative: 2 % — AB
Eosinophils %: 0 %
Eosinophils Absolute: 0 10*3/uL (ref 0.0–0.6)
Hematocrit: 19.4 % — CL (ref 40.5–52.5)
Hemoglobin: 6.6 g/dL — CL (ref 13.5–17.5)
Lymphocytes %: 50 %
Lymphocytes Absolute: 1.2 10*3/uL (ref 1.0–5.1)
MCH: 29.7 pg (ref 26.0–34.0)
MCHC: 34.3 g/dL (ref 31.0–36.0)
MCV: 86.8 fL (ref 80.0–100.0)
MPV: 7.4 fL (ref 5.0–10.5)
Monocytes %: 2 %
Monocytes Absolute: 0 10*3/uL (ref 0.0–1.3)
Neutrophils %: 36 %
Neutrophils Absolute: 0.9 10*3/uL — CL (ref 1.7–7.7)
Platelets: 38 10*3/uL — ABNORMAL LOW (ref 135–450)
RBC: 2.23 M/uL — ABNORMAL LOW (ref 4.20–5.90)
RDW: 16.2 % — ABNORMAL HIGH (ref 12.4–15.4)
WBC: 2.2 10*3/uL — ABNORMAL LOW (ref 4.0–11.0)

## 2017-04-25 LAB — HEPATIC FUNCTION PANEL
ALT: 40 U/L (ref 10–40)
AST: 55 U/L — ABNORMAL HIGH (ref 15–37)
Albumin: 2.4 g/dL — ABNORMAL LOW (ref 3.4–5.0)
Alkaline Phosphatase: 168 U/L — ABNORMAL HIGH (ref 40–129)
Bilirubin, Direct: <0.2 mg/dL (ref 0.0–0.3)
Total Bilirubin: 0.7 mg/dL (ref 0.0–1.0)
Total Protein: 11.7 g/dL — ABNORMAL HIGH (ref 6.4–8.2)

## 2017-04-25 LAB — BASIC METABOLIC PANEL
Anion Gap: 8 (ref 3–16)
BUN: 18 mg/dL (ref 7–20)
CO2: 20 mmol/L — ABNORMAL LOW (ref 21–32)
Calcium: 8.4 mg/dL (ref 8.3–10.6)
Chloride: 103 mmol/L (ref 99–110)
Creatinine: 1.4 mg/dL — ABNORMAL HIGH (ref 0.9–1.3)
GFR African American: >60 (ref >60)
GFR Non-African American: 52 — AB (ref >60)
Glucose: 200 mg/dL — ABNORMAL HIGH (ref 70–99)
Potassium: 4.6 mmol/L (ref 3.5–5.1)
Sodium: 131 mmol/L — ABNORMAL LOW (ref 136–145)

## 2017-04-25 LAB — PREPARE RBC (CROSSMATCH)
Dispense Status Blood Bank: TRANSFUSED
Dispense Status Blood Bank: TRANSFUSED

## 2017-04-25 LAB — IGG: IgG: 6379 mg/dL — ABNORMAL HIGH (ref 700.0–1600.0)

## 2017-04-25 LAB — CULTURE, URINE: Urine Culture, Routine: NO GROWTH

## 2017-04-25 LAB — PROCALCITONIN: Procalcitonin: 0.91 ng/mL — ABNORMAL HIGH (ref 0.00–0.15)

## 2017-04-25 LAB — LACTATE DEHYDROGENASE: LD: 586 U/L — ABNORMAL HIGH (ref 100–190)

## 2017-04-25 LAB — CULTURE, THROAT: Throat Culture: NORMAL

## 2017-04-25 LAB — IGM: IgM: 29 mg/dL — ABNORMAL LOW (ref 40.0–230.0)

## 2017-04-25 LAB — IGA: IgA: 106 mg/dL (ref 70.0–400.0)

## 2017-04-25 LAB — URIC ACID: Uric Acid, Serum: 5.1 mg/dL (ref 3.5–7.2)

## 2017-04-25 LAB — PHOSPHORUS: Phosphorus: 3.9 mg/dL (ref 2.5–4.9)

## 2017-04-25 MED ORDER — SODIUM CHLORIDE 0.9 % IV BOLUS
0.9 % | Freq: Once | INTRAVENOUS | Status: AC
Start: 2017-04-25 — End: 2017-04-25
  Administered 2017-04-25: 10:00:00 250 mL via INTRAVENOUS

## 2017-04-25 MED ORDER — SENNOSIDES 8.6 MG PO TABS
8.6 MG | Freq: Once | ORAL | Status: AC
Start: 2017-04-25 — End: 2017-04-25
  Administered 2017-04-25: 17:00:00 17.2 via ORAL

## 2017-04-25 MED FILL — MEROPENEM 1 G IV SOLR: 1 g | INTRAVENOUS | Qty: 1

## 2017-04-25 MED FILL — OXYCODONE HCL 5 MG PO TABS: 5 MG | ORAL | Qty: 2

## 2017-04-25 MED FILL — GUAIFENESIN-CODEINE 100-10 MG/5ML PO SOLN: 100-10 MG/5ML | ORAL | Qty: 10

## 2017-04-25 MED FILL — SODIUM CHLORIDE 0.9 % IV SOLN: 0.9 % | INTRAVENOUS | Qty: 250

## 2017-04-25 MED FILL — ALBUTEROL SULFATE (2.5 MG/3ML) 0.083% IN NEBU: RESPIRATORY_TRACT | Qty: 3

## 2017-04-25 MED FILL — ZOLPIDEM TARTRATE 5 MG PO TABS: 5 MG | ORAL | Qty: 1

## 2017-04-25 MED FILL — DEXAMETHASONE 4 MG PO TABS: 4 MG | ORAL | Qty: 10

## 2017-04-25 MED FILL — AZITHROMYCIN 250 MG PO TABS: 250 MG | ORAL | Qty: 1

## 2017-04-25 MED FILL — PANTOPRAZOLE SODIUM 40 MG PO TBEC: 40 MG | ORAL | Qty: 1

## 2017-04-25 MED FILL — GABAPENTIN 300 MG PO CAPS: 300 MG | ORAL | Qty: 1

## 2017-04-25 MED FILL — VALACYCLOVIR HCL 500 MG PO TABS: 500 MG | ORAL | Qty: 1

## 2017-04-25 MED FILL — BUDESONIDE 0.25 MG/2ML IN SUSP: 0.25 MG/2ML | RESPIRATORY_TRACT | Qty: 2

## 2017-04-25 MED FILL — SODIUM CHLORIDE 0.9 % IV SOLN: 0.9 % | INTRAVENOUS | Qty: 1000

## 2017-04-25 MED FILL — SENNA 8.6 MG PO TABS: 8.6 MG | ORAL | Qty: 2

## 2017-04-25 NOTE — Progress Notes (Signed)
Patient continues on bed alarm. Unsteady gait when starting to get up then becomes more steady once on his feet. Patient insists on speaking with doctor to see about getting off bed alarm.  Encouraged resident to get up slow with assistance to avoid dizziness and lightheadedness. Patient showered with assistance. NSR on tele continues. Complains of pain to ribcage. PRN Oxycodone given.  Patient noted being more groggy but complained of no pain. Patient complained of cough, PRN Robitussin given. No further complaints. Will continue to monitor.

## 2017-04-25 NOTE — Plan of Care (Signed)
Problem: Bleeding:  Goal: Will show no signs and symptoms of excessive bleeding  Will show no signs and symptoms of excessive bleeding   Outcome: Ongoing  ??  Patient's hemoglobin this AM:   Recent Labs      04/25/17   0410   HGB  6.6*     Patient's platelet count this AM:   Recent Labs      04/25/17   0410   PLT  38*    Thrombocytopenia Precautions in place.  Patient showing no signs or symptoms of active bleeding.  Patient transfused blood products per orders - see flowsheet.  Patient verbalizes understanding of all instructions. Will continue to assess and implement POC. Call light within reach and hourly rounding in place.   ??  Problem: Falls - Risk of:  Goal: Will remain free from falls  Will remain free from falls    Outcome: Ongoing  Orthostatic vital signs obtained at start of shift - see flowsheet for details.  Pt meets criteria for orthostasis.  Pt is a High fall risk. See Leamon Arnt Fall Score and ABCDS Injury Risk assessments.   + Screening for Orthostasis and/or + High Fall Risk per MORSE/ABCDS: Explained fall risk precautions to pt and family and rationale behind their use to keep the patient safe. Pt bed is in low position, side rails up, call light and belongings are in reach. Fall wristband applied and present on pts wrist.  Bed alarm on.  Pt encouraged to call for assistance. Will continue with hourly rounds for PO intake, pain needs, toileting and repositioning as needed.   ??  ??  Problem: Pain:  Goal: Pain level will decrease  Pain level will decrease   Outcome: Ongoing

## 2017-04-25 NOTE — Plan of Care (Signed)
Problem: Bleeding:  Goal: Will show no signs and symptoms of excessive bleeding  Will show no signs and symptoms of excessive bleeding   Outcome: Ongoing   Thrombocytopenia Precautions in place.  Patient showing no signs or symptoms of active bleeding.  Patient received transfusions per orders prior to this shift.  Patient verbalizes understanding of all instructions. Will continue to assess and implement POC. Call light within reach and hourly rounding in place.     Problem: Falls - Risk of:  Goal: Will remain free from falls  Will remain free from falls    Outcome: Ongoing  No falls reported this shift. Patient continues on bed alarm. Will continue to monitor.     Problem: Pain:  Goal: Pain level will decrease  Pain level will decrease   Outcome: Ongoing  Patient complained of pain to mid ribcage.  Prn Oxycodone given.  No further complaints. Will continue to monitor.      Problem: Nutrition  Goal: Optimal nutrition therapy  Outcome: Ongoing  Patient ate 50% or more meals. Will continue to monitor.

## 2017-04-26 ENCOUNTER — Inpatient Hospital Stay: Admit: 2017-04-26 | Payer: BLUE CROSS/BLUE SHIELD | Primary: Hematology & Oncology

## 2017-04-26 ENCOUNTER — Encounter: Attending: Plastic Surgery within the Head & Neck | Primary: Hematology & Oncology

## 2017-04-26 LAB — URINALYSIS
Bilirubin Urine: NEGATIVE
Glucose, Ur: NEGATIVE mg/dL
Ketones, Urine: NEGATIVE mg/dL
Leukocyte Esterase, Urine: NEGATIVE
Nitrite, Urine: NEGATIVE
Protein, UA: 100 mg/dL — AB
Specific Gravity, UA: >=1.03 (ref 1.005–1.030)
Urobilinogen, Urine: 0.2 E.U./dL (ref <2.0)
pH, UA: 6 (ref 5.0–8.0)

## 2017-04-26 LAB — CBC WITH AUTO DIFFERENTIAL
Bands Relative: 3 % (ref 0–7)
Basophils %: 2 %
Basophils Absolute: 0 10*3/uL (ref 0.0–0.2)
Eosinophils %: 2 %
Eosinophils Absolute: 0 10*3/uL (ref 0.0–0.6)
Hematocrit: 21.4 % — ABNORMAL LOW (ref 40.5–52.5)
Hemoglobin: 7.1 g/dL — ABNORMAL LOW (ref 13.5–17.5)
Lymphocytes %: 54 %
Lymphocytes Absolute: 1.1 10*3/uL (ref 1.0–5.1)
MCH: 29.2 pg (ref 26.0–34.0)
MCHC: 33.1 g/dL (ref 31.0–36.0)
MCV: 88.3 fL (ref 80.0–100.0)
MPV: 7.3 fL (ref 5.0–10.5)
Monocytes %: 6 %
Monocytes Absolute: 0.1 10*3/uL (ref 0.0–1.3)
Neutrophils %: 33 %
Neutrophils Absolute: 0.8 10*3/uL — CL (ref 1.7–7.7)
Platelets: 14 10*3/uL — CL (ref 135–450)
RBC: 2.42 M/uL — ABNORMAL LOW (ref 4.20–5.90)
RDW: 16.2 % — ABNORMAL HIGH (ref 12.4–15.4)
WBC: 2.1 10*3/uL — ABNORMAL LOW (ref 4.0–11.0)

## 2017-04-26 LAB — HEPATIC FUNCTION PANEL
ALT: 43 U/L — ABNORMAL HIGH (ref 10–40)
AST: 55 U/L — ABNORMAL HIGH (ref 15–37)
Albumin: 2.4 g/dL — ABNORMAL LOW (ref 3.4–5.0)
Alkaline Phosphatase: 152 U/L — ABNORMAL HIGH (ref 40–129)
Bilirubin, Direct: <0.2 mg/dL (ref 0.0–0.3)
Total Bilirubin: 0.5 mg/dL (ref 0.0–1.0)
Total Protein: 11.8 g/dL — ABNORMAL HIGH (ref 6.4–8.2)

## 2017-04-26 LAB — PROCALCITONIN: Procalcitonin: 0.54 ng/mL — ABNORMAL HIGH (ref 0.00–0.15)

## 2017-04-26 LAB — BASIC METABOLIC PANEL
Anion Gap: 10 (ref 3–16)
BUN: 19 mg/dL (ref 7–20)
CO2: 18 mmol/L — ABNORMAL LOW (ref 21–32)
Calcium: 8.2 mg/dL — ABNORMAL LOW (ref 8.3–10.6)
Chloride: 106 mmol/L (ref 99–110)
Creatinine: 1.1 mg/dL (ref 0.9–1.3)
GFR African American: >60 (ref >60)
GFR Non-African American: >60 (ref >60)
Glucose: 150 mg/dL — ABNORMAL HIGH (ref 70–99)
Potassium: 4.3 mmol/L (ref 3.5–5.1)
Sodium: 134 mmol/L — ABNORMAL LOW (ref 136–145)

## 2017-04-26 LAB — MICROSCOPIC URINALYSIS

## 2017-04-26 LAB — PROTIME-INR

## 2017-04-26 LAB — PREPARE PLATELETS
Dispense Status Blood Bank: TRANSFUSED
Dispense Status Blood Bank: TRANSFUSED

## 2017-04-26 LAB — BETA 2 MICROGLOBULIN, SERUM: Beta-2 Microglobulin: 16.5 mg/L — ABNORMAL HIGH (ref 1.1–2.4)

## 2017-04-26 LAB — THROMBIN TIME

## 2017-04-26 LAB — PROTHROMBIN TIME MIXING STUDY
INR: >14.91 (ref 0.86–1.14)
Protime: >170 s — ABNORMAL HIGH (ref 9.8–13.0)

## 2017-04-26 LAB — PLATELET COUNT: Platelets: 65 10*3/uL — ABNORMAL LOW (ref 135–450)

## 2017-04-26 LAB — APTT: aPTT: 31.2 s (ref 26.0–36.0)

## 2017-04-26 LAB — D-DIMER, QUANTITATIVE: D-Dimer, Quant: 1476 ng/mL DDU — ABNORMAL HIGH (ref 0–229)

## 2017-04-26 MED ORDER — NORMAL SALINE FLUSH 0.9 % IV SOLN
0.9 % | INTRAVENOUS | Status: DC | PRN
Start: 2017-04-26 — End: 2017-05-01

## 2017-04-26 MED ORDER — LEVOFLOXACIN 500 MG PO TABS
500 MG | Freq: Every evening | ORAL | Status: DC
Start: 2017-04-26 — End: 2017-05-01
  Administered 2017-04-27 – 2017-05-01 (×5): 500 mg via ORAL

## 2017-04-26 MED ORDER — MIDAZOLAM HCL 2 MG/2ML IJ SOLN
2 | INTRAMUSCULAR | Status: AC
Start: 2017-04-26 — End: 2017-04-26

## 2017-04-26 MED ORDER — ETOPOSIDE 20 MG/ML IV SOLN (MIXTURES ONLY)
20 MG/ML | INTRAVENOUS | Status: AC
Start: 2017-04-26 — End: 2017-04-30
  Administered 2017-04-26 – 2017-04-30 (×4): via INTRAVENOUS

## 2017-04-26 MED ORDER — FENTANYL CITRATE (PF) 100 MCG/2ML IJ SOLN
100 | INTRAMUSCULAR | Status: AC
Start: 2017-04-26 — End: 2017-04-26

## 2017-04-26 MED ORDER — PROCHLORPERAZINE MALEATE 10 MG PO TABS
10 MG | ORAL | Status: DC | PRN
Start: 2017-04-26 — End: 2017-05-01

## 2017-04-26 MED ORDER — LIDOCAINE HCL 1 % IJ SOLN
1 | INTRAMUSCULAR | Status: AC
Start: 2017-04-26 — End: 2017-04-26

## 2017-04-26 MED ORDER — PROCHLORPERAZINE EDISYLATE 5 MG/ML IJ SOLN
5 MG/ML | INTRAMUSCULAR | Status: DC | PRN
Start: 2017-04-26 — End: 2017-05-01
  Administered 2017-04-29: 08:00:00 10 mg via INTRAVENOUS

## 2017-04-26 MED ORDER — NORMAL SALINE FLUSH 0.9 % IV SOLN
0.9 % | Freq: Two times a day (BID) | INTRAVENOUS | Status: DC
Start: 2017-04-26 — End: 2017-05-01
  Administered 2017-04-26 – 2017-05-01 (×11): 10 mL via INTRAVENOUS

## 2017-04-26 MED ORDER — ALLOPURINOL 300 MG PO TABS
300 MG | Freq: Every day | ORAL | Status: DC
Start: 2017-04-26 — End: 2017-05-01
  Administered 2017-04-26 – 2017-05-01 (×6): 300 mg via ORAL

## 2017-04-26 MED ORDER — ONDANSETRON HCL 8 MG PO TABS
8 MG | ORAL | Status: AC
Start: 2017-04-26 — End: 2017-04-30
  Administered 2017-04-26 – 2017-04-30 (×5): 24 mg via ORAL

## 2017-04-26 MED ORDER — FLUCONAZOLE 200 MG PO TABS
200 MG | Freq: Every day | ORAL | Status: DC
Start: 2017-04-26 — End: 2017-05-01
  Administered 2017-04-26 – 2017-05-01 (×6): 200 mg via ORAL

## 2017-04-26 MED ORDER — LIDOCAINE HCL (PF) 1 % IJ SOLN
1 % | Freq: Once | INTRAMUSCULAR | Status: DC
Start: 2017-04-26 — End: 2017-05-01

## 2017-04-26 MED ORDER — GUAIFENESIN 100 MG/5ML PO SOLN
100 MG/5ML | ORAL | Status: DC | PRN
Start: 2017-04-26 — End: 2017-05-01
  Administered 2017-04-26 – 2017-05-01 (×10): 200 mg via ORAL

## 2017-04-26 MED ORDER — BISACODYL 5 MG PO TBEC
5 MG | Freq: Once | ORAL | Status: AC
Start: 2017-04-26 — End: 2017-04-26
  Administered 2017-04-26: 14:00:00 5 mg via ORAL

## 2017-04-26 MED ORDER — SODIUM CHLORIDE 0.9 % IV BOLUS
0.9 % | Freq: Once | INTRAVENOUS | Status: DC
Start: 2017-04-26 — End: 2017-04-27

## 2017-04-26 MED ORDER — HEPARIN (PORCINE) IN NACL 2-0.9 UNIT/ML-% IJ SOLN
2-0.9 | INTRAMUSCULAR | Status: AC
Start: 2017-04-26 — End: 2017-04-26

## 2017-04-26 MED ORDER — FUROSEMIDE 10 MG/ML IJ SOLN
10 MG/ML | Freq: Two times a day (BID) | INTRAMUSCULAR | Status: DC
Start: 2017-04-26 — End: 2017-04-27

## 2017-04-26 MED FILL — GABAPENTIN 300 MG PO CAPS: 300 MG | ORAL | Qty: 1

## 2017-04-26 MED FILL — ALLOPURINOL 300 MG PO TABS: 300 MG | ORAL | Qty: 1

## 2017-04-26 MED FILL — MIDAZOLAM HCL 2 MG/2ML IJ SOLN: 2 MG/ML | INTRAMUSCULAR | Qty: 2

## 2017-04-26 MED FILL — SODIUM CHLORIDE 0.9 % IV SOLN: 0.9 % | INTRAVENOUS | Qty: 1000

## 2017-04-26 MED FILL — ALBUTEROL SULFATE (2.5 MG/3ML) 0.083% IN NEBU: RESPIRATORY_TRACT | Qty: 3

## 2017-04-26 MED FILL — SODIUM CHLORIDE 0.9 % IV SOLN: 0.9 % | INTRAVENOUS | Qty: 250

## 2017-04-26 MED FILL — MEROPENEM 1 G IV SOLR: 1 g | INTRAVENOUS | Qty: 1

## 2017-04-26 MED FILL — GUAIFENESIN-CODEINE 100-10 MG/5ML PO SOLN: 100-10 MG/5ML | ORAL | Qty: 10

## 2017-04-26 MED FILL — VALACYCLOVIR HCL 500 MG PO TABS: 500 MG | ORAL | Qty: 1

## 2017-04-26 MED FILL — STIMULANT LAXATIVE 5 MG PO TBEC: 5 MG | ORAL | Qty: 1

## 2017-04-26 MED FILL — CYCLOPHOSPHAMIDE 1 G IJ SOLR: 1 GM | INTRAMUSCULAR | Qty: 40

## 2017-04-26 MED FILL — OXYCODONE HCL 5 MG PO TABS: 5 MG | ORAL | Qty: 1

## 2017-04-26 MED FILL — DEXAMETHASONE 4 MG PO TABS: 4 MG | ORAL | Qty: 10

## 2017-04-26 MED FILL — GUAIFENESIN 100 MG/5ML PO SOLN: 100 MG/5ML | ORAL | Qty: 10

## 2017-04-26 MED FILL — HEPARIN (PORCINE) IN NACL 2-0.9 UNIT/ML-% IJ SOLN: INTRAMUSCULAR | Qty: 500

## 2017-04-26 MED FILL — ONDANSETRON HCL 8 MG PO TABS: 8 MG | ORAL | Qty: 3

## 2017-04-26 MED FILL — BUDESONIDE 0.25 MG/2ML IN SUSP: 0.25 MG/2ML | RESPIRATORY_TRACT | Qty: 2

## 2017-04-26 MED FILL — PANTOPRAZOLE SODIUM 40 MG PO TBEC: 40 MG | ORAL | Qty: 1

## 2017-04-26 MED FILL — LIDOCAINE HCL 1 % IJ SOLN: 1 % | INTRAMUSCULAR | Qty: 20

## 2017-04-26 MED FILL — FENTANYL CITRATE (PF) 100 MCG/2ML IJ SOLN: 100 MCG/2ML | INTRAMUSCULAR | Qty: 2

## 2017-04-26 MED FILL — FLUCONAZOLE 200 MG PO TABS: 200 MG | ORAL | Qty: 1

## 2017-04-26 NOTE — Procedures (Signed)
Brief Postoperative Note    David Watkins  Date of Birth:  [birth year]  [id number]    Pre-operative Diagnosis: multiple myeloma    Post-operative Diagnosis: Same    Procedure: PICC placement. Double lumen via right basilic vein with tip overlying cavoatrial junction.    Anesthesia: moderate sedation    Surgeons/Assistants: Aida Puffer    Estimated Blood Loss: Minimal    Complications: none    Specimens: were not obtained      [name] MD  04/26/2017

## 2017-04-26 NOTE — Plan of Care (Signed)
Problem: Bleeding:  Goal: Will show no signs and symptoms of excessive bleeding  Will show no signs and symptoms of excessive bleeding   Outcome: Ongoing  Patient's hemoglobin this AM:   Recent Labs      04/26/17   0349   HGB  7.1*     Patient's platelet count this AM:   Recent Labs      04/26/17   0349   PLT  14*    Thrombocytopenia Precautions in place.  Patient showing no signs or symptoms of active bleeding.  Transfusion not indicated at this time.  Patient verbalizes understanding of all instructions. Will continue to assess and implement POC. Call light within reach and hourly rounding in place.     Problem: Falls - Risk of:  Goal: Will remain free from falls  Will remain free from falls    Outcome: Ongoing  Pt remains on bed/chair alarm due to weakness and pt stating that he is "dizzy" at times. Pt exhibits shuffling gait. Calls out appropriately. Pt compliant with BEA.     Problem: Pain:  Goal: Pain level will decrease  Pain level will decrease   Outcome: Ongoing  No reports of pain during this shift. Will continue to monitor.    Problem: Infection - Central Venous Catheter-Associated Bloodstream Infection:  Goal: Will show no infection signs and symptoms  Will show no infection signs and symptoms  Outcome: Ongoing  CVC site remains free of signs/symptoms of infection. No drainage, edema, erythema, pain, or warmth noted at site. Dressing changes continue per protocol and on an as needed basis - see flowsheet.     Compliant with [facility] Bath Protocol:  Performed CHG bath today per [facility] protocol utilizing CHG solution in the shower.  CVC site cleansed with CHG wipe over dressing, skin surrounding dressing, and first 6" of IV tubing.  Pt tolerated well.  Continued to encourage daily CHG bathing per [facility] protocol.

## 2017-04-26 NOTE — Plan of Care (Addendum)
Pt c/o on side/rib pain from coughing. PRN Pain medication given. See MAR. Pt sleeping upon reassessment.       Problem: Bleeding:  Goal: Will show no signs and symptoms of excessive bleeding  Will show no signs and symptoms of excessive bleeding   Outcome: Ongoing  Patient's hemoglobin this AM:   Recent Labs      04/26/17   0349   HGB  7.1*     Patient's platelet count this AM:   Recent Labs      04/26/17   1511   PLT  65*    Thrombocytopenia Precautions in place.  Patient showing no signs or symptoms of active bleeding.  Patient transfused blood products per orders - see flowsheet.  Patient verbalizes understanding of all instructions. Will continue to assess and implement POC. Call light within reach and hourly rounding in place.     Problem: Falls - Risk of:  Goal: Will remain free from falls  Will remain free from falls    Outcome: Ongoing  Orthostatic vital signs obtained at start of shift - see flowsheet for details.  Pt does not meet criteria for orthostasis.  Pt is a High fall risk. See Leamon Arnt Fall Score and ABCDS Injury Risk assessments.   + Screening for Orthostasis and/or + High Fall Risk per MORSE/ABCDS: Explained fall risk precautions to pt and family and rationale behind their use to keep the patient safe. Pt bed is in low position, side rails up, call light and belongings are in reach. Fall wristband applied and present on pts wrist.  Bed alarm on.  Pt encouraged to call for assistance. Will continue with hourly rounds for PO intake, pain needs, toileting and repositioning as needed.     Problem: Pain:  Goal: Pain level will decrease  Pain level will decrease   Outcome: Ongoing      Problem: Infection - Central Venous Catheter-Associated Bloodstream Infection:  Goal: Will show no infection signs and symptoms  Will show no infection signs and symptoms   Outcome: Ongoing  Pt afebrile throughout shift.  VSS.  No sign of redness, tenderness, or drainage at line site.  Will continue to monitor.

## 2017-04-26 NOTE — Progress Notes (Signed)
[facility]  Autologous Progress Note    04/26/2017    David Watkins    DOB:  [birth year]    MRN:  [id number]    Referring MD: Eulis Canner, [place] [address]      Subjective:  Pain a little better.  No new complaints.      ECOG PS:  (2) Ambulatory and capable of self care, unable to carry out work activity, up and about > 50% or waking hours    Isolation:  None     Medications    Scheduled Meds:  . fluconazole  200 mg Oral Daily   . levofloxacin  500 mg Oral Nightly   . lidocaine 1 % injection  5 mL Intradermal Once   . sodium chloride flush  10 mL Intravenous 2 times per day   . bisacodyl  5 mg Oral Once   . allopurinol  300 mg Oral Daily   . pantoprazole  40 mg Oral QAM AC   . dexamethasone  40 mg Oral Daily   . Saline Mouthwash  15 mL Swish & Spit 4x Daily AC & HS   . gabapentin  300 mg Oral TID   . valACYclovir  500 mg Oral BID   . zolpidem  5 mg Oral Nightly   . budesonide  0.25 mg Nebulization BID   . albuterol  2.5 mg Nebulization 4x daily     Continuous Infusions:  . sodium chloride 100 mL/hr at 04/25/17 2108     PRN Meds:.sodium chloride flush, alteplase, magnesium hydroxide, Saline Mouthwash, naloxone, acetaminophen, magnesium sulfate, potassium chloride **OR** potassium chloride **OR** potassium chloride, oxyCODONE **OR** oxyCODONE, morphine **OR** morphine, LORazepam **OR** LORazepam, guaiFENesin-codeine, senna, polyethylene glycol    ROS:   Constitutional: Denies fever, sweats, weight loss.    Eyes: No visual changes or diplopia. No scleral icterus.   ENT: No Headaches, hearing loss or vertigo. No mouth sores or sore throat.   Cardiovascular: No chest pain, dyspnea on exertion, palpitations or loss of consciousness.    Respiratory: No cough or wheezing, no sputum production. No hemoptysis.   Gastrointestinal: No abdominal pain, appetite loss, blood in stools. No change in bowel habits.   Genitourinary: No dysuria, trouble voiding, or hematuria.   Musculoskeletal:   No generalized weakness. No joint complaints.   Integumentary: No rash or pruritis.   Neurological: No headache, diplopia. No change in gait, balance, or coordination. No paresthesias.   Endocrine: No temperature intolerance. No excessive thirst, fluid intake, or urination.    Hematologic/Lymphatic: No abnormal bruising or ecchymoses, blood clots or swollen lymph nodes.   Allergic/Immunologic: No nasal congestion or hives.     Physical Exam:     I&O:      Intake/Output Summary (Last 24 hours) at 04/26/17 0858  Last data filed at 04/26/17 0414   Gross per 24 hour   Intake             1798 ml   Output             1600 ml   Net              198 ml       Vital Signs:  BP (!) 150/93 Comment: post ambulation  Pulse 76   Temp 97.6 F (36.4 C) (Oral)   Resp 16   Ht 5' 9"  (1.753 m)   Wt 184 lb 15.5 oz (83.9 kg)   SpO2 96%  BMI 27.31 kg/m     Weight:    Wt Readings from Last 3 Encounters:   04/25/17 184 lb 15.5 oz (83.9 kg)   04/05/17 180 lb (81.6 kg)   03/29/17 189 lb 1.6 oz (85.8 kg)       General: Awake, alert and oriented   HEENT: normocephalic, alopecia, PERRL, no scleral erythema or icterus, Oral mucosa moist and intact, throat clear.   NECK: supple without palpable adenopathy  BACK: Straight, negative CVAT  SKIN: warm dry and intact without lesions rashes or masses  CHEST: CTA bilaterally without use of accessory muscles.  Very tender right anterior costal margin  CV: Normal S1 S2, RRR, no MRG  ABD: NT ND normoactive BS, no palpable masses or hepatosplenomegaly  EXTREMITIES: without edema, denies calf tenderness  NEURO: CN II - XII grossly intact  CATHETER: PIV    Data:   CBC:   Recent Labs      04/24/17   0418  04/25/17   0410  04/26/17   0349   WBC  2.4*  2.2*  2.1*   HGB  6.0*  6.6*  7.1*   HCT  17.7*  19.4*  21.4*   MCV  86.7  86.8  88.3   PLT  33*  38*  14*     BMP/Mag:  Recent Labs      04/23/17   1614  04/24/17   0418  04/25/17   0410  04/26/17   0349   NA  131*  132*  131*  134*   K  4.1  4.1   4.6  4.3   CL  99  101  103  106   CO2  20*  21  20*  18*   PHOS  5.0*  5.8*  3.9   --    BUN  21*  20  18  19    CREATININE  1.4*  1.5*  1.4*  1.1   MG  2.00   --    --    --      LIVP:   Recent Labs      04/23/17   1610   04/24/17   0418  04/25/17   0410  04/26/17   0349   AST   --    < >  68*  55*  55*   ALT   --    < >  44*  40  43*   LIPASE  16.0   --    --    --    --    BILIDIR   --    < >  0.3  <0.2  <0.2   BILITOT   --    < >  1.1*  0.7  0.5   ALKPHOS   --    < >  167*  168*  152*    < > = values in this interval not displayed.     Uric Acid:    Recent Labs      04/25/17   0410   LABURIC  5.1     Coags:   Recent Labs      04/24/17   0418   APTT  32.7       Pending    PROBLEM LIST:      IgG Kappa Multiple myeloma  Peripheral neuropathy  Depression  Erectile dysfunction  Hyperlipidemia  Hypertension  Insomnia  Low back pain    TREATMENT:      1.  Rad Tx to T5-7, T10-L3, Right Scapula - 3000 cGy - Dr. Jamas Lav 03/20/16-04/01/16  2. RVD x1 03/20/16 - discontinued d/t rash   3. Velcade/Pomalyst/Dex x3 cycles 04/23/16-06/17/16 - discontinued d/t reaction to pomalyst  4. Velcade/Dex x 1 cycle 06/26/16 (last dose of dexamethasone 07/22/16  5. High-dose melphalan followed by administration of PBSCs 2.36 x10^6 cd34cells/kg on 08/28/16  6. Maintenance Revlimid 43m daily (12/2016-04/23/17)    ASSESSMENT AND PLAN:     1. IgG kappa multiple myeloma: [facility] with progressive anorexia, nonproductive cough, 17 pound wt loss, progressive weakness, DOE, & severe thrombocytopenia all concerning for disease relapse.  - h/o lytic lesions as well as cord compression at T6 and T11  - Initially PR after 5 cycles chemotherapy - M-spike is 0.4 &BM biopsy 2 % plasma cells prior to auto txp. Restaging post-transplant showed 1% plasma cells in the bone marrow, PET/CT 12/07/16 showed no hypermetabolic areas.  - Most recent MM studies 02/19/17: SPE w/monoclonal IgG kappa of 0.7 gm/dL, IgG 1910, SFLCs  Kappa 65.8, Lambda 35.1, K/L 1.87.    - Hold maintenance revlimid & ASA at this time  - Serum & Urine MM panel 04/23/17 pending  - Plasmablasts on peripheral smear 04/23/17  - BM Bx/Asp 04/24/17     2. ID: Afebrile, but presenting with severe cough & SOB  - Blood Cx 04/23/17 pending. Procalcitonin, Resp cx (viral & bacterial), fungitel, Galactomannan, CMV & EBV PCR, Strep Pneumo UrAg all pending on admission.  - Lactic Acid pending  - Start Merrem D+4 (04/23/17)  - Azithromycin D+4 (04/23/17)      3. Heme: Anemia & thrombocytopenia, unknown etiology. Concern for disease relapse  - Transfuse for Hgb < 7 and Platelets < 10K  - Plt transfusion today    4. Metabolic: Labs pending at time of H&P. Will check CMP. With dehydration POA, concern for ID issues, & possible relapse, suspect he will have AKI.   - Start IVF hydration: NS @ 1038mhr    5. Pulmonary: Chronic Bronchitis/COPD. H/o pulmonary nodule.   - Pulm Nodule: Monitor. Stable on Repeat CT chest 03/08/2017  - Cont albuterol PRN  - Cont anoro ellipta inhaler  - Repeat Chest CT 04/23/17, pending  - Codeine/Guiafensisin cough syrup q4h PRN    1. Redemonstration of multiple lytic lesions throughout the skeleton compatible with patient's history of multiple myeloma.   2. 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 Stable scattered submillimeter pulmonary parenchymal nodules.   4. Marked centrilobular emphysematous changes with upper lobe predominance, stable.   5. Progression of splenomegaly         6. GI/Nutrition: Appetite and oral intake is poor d/t critical illness. No BM x3 days  - Start low microbial diet   - Constipation: Miralax & Senna PRN    7. Acute Pain: Now w/acute pain to RUQ below ribs as well as to left chest rib pain  - Off all narcotics prior to admission  - Oxycodone PO & Morphine IVP PRN   - Previously followed with pain specialist as outpt  -  Check amylase/lipase 04/23/17  - Check RUQ U/S with tenderness & pain to RUQ 04/23/17    8. Cardiac: h/o HLD. Septal wall defect on echocardiogram   - Lipitor on hold for now  - Echo 07/13/16 w/abnormal (paradoxical) septal motion is present with preserved LVEF 55-60%.  -  Telemetry on admission    9. Anxiety/Depression: Ongoing  - Ativan PRN    10. Insomnia: ongoing, has failed multiple sleep aids  - Cont sonata/Ambien PRN HS    11. Peripheral Neuropathy: Ongoing  - Cont Gabapentin 300 mg TID     12. IV Access: PIV for now  - Depending work up and severity of illness, may need central access      - DVT Prophylaxis: Platelets <50,000 cells/dL - prophylactic lovenox on hold and mechanical prophylaxis with bilateral SCDs while in bed in place.  Contraindications to pharmacologic prophylaxis: Thrombocytopenia  Contraindications to mechanical prophylaxis: None      - Disposition: Uncertain at this time. Pending pulmonary & heme-onc disease w/u.      Harlene Salts, MD

## 2017-04-26 NOTE — Progress Notes (Signed)
[facility] Progress Note    04/26/2017     DEANGELO BERNS    MRN: [id number]    DOB: Oct 18, 1955    Referring MD:  Eulis Canner, [place] [address]      SUBJECTIVE:  Developing pain inferior to the right scapula.  Persistent right anterior rib pain.    ECOG PS:  (2) Ambulatory and capable of self care, unable to carry out work activity, up and about > 50% or waking hours    Isolation: None    Medications    Scheduled Meds:  ??? pantoprazole  40 mg Oral QAM AC   ??? dexamethasone  40 mg Oral Daily   ??? Saline Mouthwash  15 mL Swish & Spit 4x Daily AC & HS   ??? sodium chloride flush  10 mL Intravenous 2 times per day   ??? azithromycin  250 mg Oral Daily   ??? meropenem  1 g Intravenous Q8H   ??? gabapentin  300 mg Oral TID   ??? valACYclovir  500 mg Oral BID   ??? zolpidem  5 mg Oral Nightly   ??? budesonide  0.25 mg Nebulization BID   ??? albuterol  2.5 mg Nebulization 4x daily     Continuous Infusions:  ??? sodium chloride 100 mL/hr at 04/25/17 2108     PRN Meds:.alteplase, magnesium hydroxide, Saline Mouthwash, naloxone, acetaminophen, magnesium sulfate, potassium chloride **OR** potassium chloride **OR** potassium chloride, oxyCODONE **OR** oxyCODONE, morphine **OR** morphine, LORazepam **OR** LORazepam, guaiFENesin-codeine, senna, polyethylene glycol    ROS  ?? Constitutional: Denies fever, sweats. + wt loss. Overwhelming fatigue & weakness  ?? Eyes: No visual changes or diplopia. No scleral icterus.  ?? ENT: No Headaches, hearing loss or vertigo. No mouth sores or sore throat.  ?? Cardiovascular: No chest pain, +dyspnea on exertion, no palpitations or loss of consciousness.   ?? Respiratory: +dry cough, no wheezing, no sputum production. No hemoptysis.    ?? Gastrointestinal: +RUQ pain, + appetite loss. No BM x3 days  ?? Genitourinary: No dysuria, trouble voiding, or hematuria.  ?? Musculoskeletal:  +generalized weakness. No joint complaints.  ?? Integumentary: No rash or pruritis.  ?? Neurological: No headache,  diplopia. No change in gait, balance, or coordination. No paresthesias.  ?? Endocrine: No temperature intolerance. No excessive thirst, fluid intake, or urination.   ?? Hematologic/Lymphatic: +petechial rash to BLE's & trunk, no evidence of blood clots or swollen lymph nodes.  ?? Allergic/Immunologic: No nasal congestion or hives.   ??    Physical Exam:     I&O:      Intake/Output Summary (Last 24 hours) at 04/26/17 0645  Last data filed at 04/26/17 0414   Gross per 24 hour   Intake             2205 ml   Output             1600 ml   Net              605 ml       Vital Signs:  BP (!) 150/93 Comment: post ambulation   Pulse 76    Temp 97.6 ??F (36.4 ??C) (Oral)    Resp 18    Ht 5' 9"  (1.753 m)    Wt 184 lb 15.5 oz (83.9 kg)    SpO2 100%    BMI 27.31 kg/m??     Weight:    Wt Readings from Last 3 Encounters:  04/25/17 184 lb 15.5 oz (83.9 kg)   04/05/17 180 lb (81.6 kg)   03/29/17 189 lb 1.6 oz (85.8 kg)       General: Awake, alert and oriented, Anxious  HEENT: normocephalic, PERRL, no scleral erythema or icterus, Oral mucosa with petechiae to soft palate, blood blister to right buccal mucosa  NECK: supple without palpable adenopathy  SKIN: warm dry and intact without lesions rashes or masses. +Scattered petechiae to trunk & BLE's  CHEST: Diminished LS's bilaterally, dry nonproductive cough  CV: Normal S1 S2, RRR, no MRG  ABD: RUQ tenderness to palpation, ND normoactive BS, no palpable masses or hepatosplenomegaly  EXTREMITIES: without edema, denies calf tenderness  NEURO: CN II - XII grossly intact  CATHETER: PIV    Data    CBC:   Recent Labs      04/24/17   0418  04/25/17   0410  04/26/17   0349   WBC  2.4*  2.2*  2.1*   HGB  6.0*  6.6*  7.1*   HCT  17.7*  19.4*  21.4*   MCV  86.7  86.8  88.3   PLT  33*  38*  14*     BMP/Mag:  Recent Labs      04/23/17   1614  04/24/17   0418  04/25/17   0410  04/26/17   0349   NA  131*  132*  131*  134*   K  4.1  4.1  4.6  4.3   CL  99  101  103  106   CO2  20*  21  20*  18*   PHOS  5.0*   5.8*  3.9   --    BUN  21*  20  18  19    CREATININE  1.4*  1.5*  1.4*  1.1   MG  2.00   --    --    --      LIVP:   Recent Labs      04/23/17   1610   04/24/17   0418  04/25/17   0410  04/26/17   0349   AST   --    < >  68*  55*  55*   ALT   --    < >  44*  40  43*   LIPASE  16.0   --    --    --    --    BILIDIR   --    < >  0.3  <0.2  <0.2   BILITOT   --    < >  1.1*  0.7  0.5   ALKPHOS   --    < >  167*  168*  152*    < > = values in this interval not displayed.     Coags:   Recent Labs      04/24/17   0418   APTT  32.7     Uric Acid   Recent Labs      04/23/17   1614  04/24/17   0418  04/25/17   0410   LABURIC  5.9  5.7  5.1     DIAGNOSTIC IMAGING:  1. CT Chest 04/23/17:  1. Redemonstration of multiple lytic lesions throughout the skeleton compatible with patient's history of multiple myeloma.   2. 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 Stable scattered submillimeter pulmonary parenchymal nodules.   4. Marked centrilobular emphysematous changes with upper lobe predominance, stable.   5. Progression of splenomegaly       PATHOLOGY:  1. BM Bx/Asp 04/24/17  Pending    2. Myeloma Studies 04/24/17:  SPEP/SIFE  SFLCs  Immunoglobulins: IgA 106, IgG 6379, IgM 29    UPEP/UIFE  UFLCs  ??  PROBLEM LIST: ??????????   ????  IgG Kappa Multiple myeloma  Peripheral neuropathy  Depression  Erectile dysfunction  Hyperlipidemia  Hypertension  Insomnia  Low back pain  ????  TREATMENT: ??????????   ??  1. Rad Tx to T5-7, T10-L3, Right Scapula - 3000 cGy - Dr. Jamas Lav 03/20/16-04/01/16  2. RVD x1 03/20/16 - discontinued d/t rash   3. Velcade/Pomalyst/Dex x3 cycles 04/23/16-06/17/16 - discontinued d/t reaction to pomalyst  4. Velcade/Dex x 1 cycle 06/26/16 (last dose of dexamethasone 07/22/16  5. High-dose melphalan followed by administration of PBSCs 2.36 x10^6 cd34cells/kg on 08/28/16  6. Maintenance Revlimid 28m daily (12/2016-04/23/17)  7.  Dexamethasone 46mdaily (04/24/17)  ????  ASSESSMENT AND PLAN:??????????   ??  1. ??IgG kappa multiple myeloma: [facility] with progressive anorexia, nonproductive cough, 17 pound wt loss, progressive??weakness, DOE, & severe thrombocytopenia all concerning for disease relapse.  - h/o lytic lesions as well as cord compression at T6 and T11  - Initially PR after 5 cycles chemotherapy - M-spike is 0.4 &??BM biopsy 2 % plasma cells prior to auto txp. Restaging post-transplant showed 1% plasma cells in the bone marrow, PET/CT 12/07/16 showed??no hypermetabolic areas.  - Most recent MM studies 02/19/17: SPE w/monoclonal IgG kappa of 0.7 gm/dL, IgG 1910, SFLCs Kappa 65.8, Lambda 35.1, K/L 1.87.  ??  - Hold maintenance revlimid & ASA at this time  - Serum & Urine MM panel 04/23/17 pending, however immunoglobulins, plasmablasts on peripheral smear, & chest CT findings are all c/w relapsed MM  - BM Bx/Asp 04/24/17, pending    Started Dex 4016maily 04/24/17 - Day +3    Begin DCEP today after PICC placement.  Monitor for TLS  ??  2. ??ID: Afebrile, presenting with severe cough & SOB??  - Merrem D+4 (04/23/17).  Stop 8/27  - Azithromycin D+4 (04/23/17).  Stop 8/27  - Start diflucan, levaquin, & valtrex ppx while neutropenic    ID W/U:  - Blood Cx 04/23/17 pending, NGTD  - Procalcitonin elevated at 1.53, Strep Pneumo UrAg neg  - Resp cx (viral & bacterial), fungitel, Galactomannan, CMV & EBV PCR,   ??  3. Heme: Pancytopenia r/t disease relapse  - Transfuse for Hgb < 7 and Platelets < 10K  - Platelet transfusion prior to PICC   ??  4. ??Metabolic:??HypoNa, HyperPhos, & AKI POA improving. Elevated serum total protein c/w disease relapse. Wt increased from admit. Fair uop  - Cont IVF hydration: NS @ 100m57m??  - Allopurinol and monitor for TLS  ??  5. Pulmonary: Chronic Bronchitis/COPD. H/o pulmonary nodule.   - Pulm Nodule: Cont to be stable - Cont to Monitor. 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 Also w/emphysematous changes.  - Codeine/Guiafensisin cough syrup q4h PRN??  ??  6. GI/Nutrition: ??Appetite and oral intake is poor d/t critical illness. No  BM x3 days  - Cont low microbial diet   - Constipation: Miralax & Senna??PRN  ??  7. Acute Pain: Now w/acute pain to RUQ below ribs as well as to left chest rib pain  - Off all narcotics prior to admission  - Oxycodone PO &??Morphine IVP PRN   - Previously followed??with pain specialist as outpt  - Amylase/lipase 04/23/17 WNL  - Check RUQ U/S with tenderness & pain to RUQ 04/23/17: nonobstructing cholelithiasis present  ????  8. Cardiac: h/o HLD. Septal wall defect on echocardiogram   - Lipitor on hold for now  - Echo 07/13/16 w/abnormal (paradoxical) septal motion is present with preserved LVEF 55-60%.  - Telemetry on admission  ??  9. Anxiety/Depression: Ongoing  - Ativan PRN  ??  10. Insomnia:??ongoing, has failed multiple sleep aids  - Cont sonata/Ambien PRN HS  ??  11. Peripheral Neuropathy: Ongoing  - Cont Gabapentin 300 mg TID   ??  12. 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 Will need high-dose induction chemotherapy  - Ask PICC team to place PICC line today 04/26/17    13. Bone Health: H/o spinal cord compression & diffuse lytic lesions t/o skeleton  - CT Chest 04/23/17 w/multiple lytic lesions throughout the skeleton   - Resume Ca/Vit D & zometa once outpt and disease & renal fxn stable  ??  ??  - DVT Prophylaxis: Platelets <50,000 cells/dL - prophylactic lovenox on hold and mechanical prophylaxis with bilateral SCDs while in bed in place.  Contraindications to pharmacologic prophylaxis: Thrombocytopenia  Contraindications to mechanical prophylaxis: None  ??  ??  - Disposition: Uncertain at this time. Pending pulmonary &  heme-onc disease w/u.  ??    Loma Newton, APRN - CNP     Harlene Salts, MD  [facility]  [phone]

## 2017-04-26 NOTE — Telephone Encounter (Signed)
Spoke with pt's wife and informed to use polysporin until pt can be seen back at this office.

## 2017-04-26 NOTE — Telephone Encounter (Signed)
Tell his wife that I'm sorry that he had to be hospitalized for his underlying problem.  I hope the hospitalization is relatively short.  Unfortunately, it would be best if I see the level of improvement before we start him on the medication and I have described.  Therefore, as long as she is not going to be in the hospital to terribly long such as 2 weeks or more, I would like to see him before we start the treatment that I wanted to.

## 2017-04-26 NOTE — Procedures (Signed)
10:13  Spoke with Tami Lin, RN caring for pt, regarding PICC order.  Pt's platelets are very low (14) and pt will need platelets infused and a repeat platelet count done - because the platelets are so low to begin with.  Pt also needs a PT/INR done.      13:59  Received call from Tami Lin, RN who states that lab cannot get a reading on pt's PT/INR.  Explained to [facility] Team cannot place a line without those numbers, and that pt is not an IR placement candidate for the same reason - they too, need an INR level.      14:27 - Received call back from [facility], inquiring after a Midline placement.  Beacause the same vessels are used for PICC and Midline placements, neither line can be placed without an INR.  Also, Midlines cannot be used to infuse chemo that requires a central line.   Aldona Bar states that pt is not on any vessicant chemo regimens.  She will talk with Melvyn Neth, NP, regarding another type of line placement.

## 2017-04-26 NOTE — Telephone Encounter (Signed)
It would be best that they just apply Polysporin ointment twice a day to the area until I see him because I need to make certain that healing is complete before we start the treatment

## 2017-04-26 NOTE — Care Coordination-Inpatient (Signed)
Initial Social Work Note    Patient was admitted to the unit on the 24th for anemia and thrombocytopenia.  SW was unable to see him on the 24th due to being off the floor and having tests completed.  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 He was diagnosed with MM in July of last year.  SW rounded with the team today.  He had been told earlier by MD that his MM had relapsed.  SW introduced self to girlfriend and explained areas this Probation officer is able to assist with.  Patient said he was going to fight.  Chemotherapy to begin today.  Contact information for this writer was given to them.    Plan of care:  SW will monitor for discharge and psychosocial needs    Estimated discharge date: unsure at this time  Disposition: Patient lives in [place], [place]; he does not have any services in the home  Advanced Directives in chart? Yes 08/26/2017  Patient/family aware of discharge plans? Yes    Freddi Starr, MSW, [place]

## 2017-04-26 NOTE — Telephone Encounter (Signed)
Wife called, Pt was admitted to hospital on Friday at [facility],  He cancelled his appt today.   Wife said you wanted him to start some medication that she was to administer to him, and you were going to show her how to do that today when he came in.  Do you still want her to do that?  Please advise, she states that he will be in the hospital for quite a while she thinks

## 2017-04-26 NOTE — Progress Notes (Signed)
Original chemotherapy orders reviewed and acknowledged. Appropriateness of chemotherapy treatment regimen DCEP for diagnosis of Multiple Myeloma was verified.  Patient educated on chemotherapy regimen.  Acknowledgement of informed consent for chemotherapy obtained.      Estimated body surface area is 2.03 meters squared as calculated from the following:    Height as of this encounter: 5' 9"  (1.753 m).    Weight as of this encounter: 186 lb 4.8 oz (84.5 kg). verified.  Appropriate dosing calculations of chemotherapy based on above height, weight, and BSA verified.        Administration: Chemotherapy drug DCEP independently verified with Ricki Dews RN prior to administration.  Acknowledgement of informed consent for chemotherapy administration verified.  Original order, appropriateness of regimen, drug supplied, height, weight, BSA, dose calculations, expiration dates/times, drug appearance, and two patient identifiers were verified by both RNs.  Drug checked for vesicant/irritant status and for risk of hypersensitivity.  Most recent laboratory values and allergies, were reviewed.  Positive, brisk blood return via CVC was confirmed prior to administration. Chest x-ray for correct line placement reviewed. Tami Lin and Ricki Dews RN verified correct rate of chemotherapy and maintenance IV fluids.  Patient was educated on chemotherapy regimen prior to administration including indication for treatment related to disease & side effects of chemotherapy drug.  Patient verbalizes understanding of all instructions.    Completion of Chemotherapy: Monitoring during infusion done per policy, see Flowsheets.  Blood return verified before, during, and after infusion per policy; no signs of extravasation.  Pt tolerating chemotherapy well and without incident.  Will continue to monitor.

## 2017-04-26 NOTE — Telephone Encounter (Signed)
For now the wife says he just found out cancer is back they will do 4 days of aggressive chemo, break for 17 days and then 4 more days of chemo. This is the short term plan for right now. Please advise. Wife [name] [phone]. Can the oncologist start him on the new medicine now?

## 2017-04-26 NOTE — Telephone Encounter (Signed)
Dr. Lemberg please see message below and advise?

## 2017-04-26 NOTE — Oncology Nurse Navigation (Signed)
Original chemotherapy orders reviewed and acknowledged. Appropriateness of chemotherapy treatment regimen DCEP for diagnosis of  MM was verified.  Patient educated on chemotherapy regimen.  Consent for chemotherapy obtained.      Estimated body surface area is 2.03 meters squared as calculated from the following:    Height as of this encounter: 5\' 9"  (1.753 m).    Weight as of this encounter: 186 lb 4.8 oz (84.5 kg). verified.  Appropriate dosing calculations of chemotherapy based on above height, weight, and BSA verified.      David Lai Feldman8/27/2018  3:33 PM

## 2017-04-26 NOTE — Telephone Encounter (Signed)
LM on VM ( [name]) to CB to tell her info from Dr. Theora Master

## 2017-04-26 NOTE — Pre Sedation (Signed)
Sedation Pre-Procedure Note    Patient Name: David Watkins   Date of Birth:November 16, 1955  Room/Bed: 3527/3527-01  Medical Record Number: [id number]  Date: 04/26/2017   Time: 3:40pm      Indication:  Needs access for chemo. Hx of multiple myeloma    Consent: I have discussed with the patient and/or the patient representative the indication, alternatives, and the possible risks and/or complications of the planned procedure and the anesthesia methods. The patient and/or patient representative appear to understand and agree to proceed.    Vital Signs:   Vitals:    04/26/17 1229   BP: (!) 144/91   Pulse: 72   Resp:    Temp: 97.9 F (36.6 C)   SpO2: 97%       Past Medical History:   has a past medical history of Bone pain; Hyperlipidemia; Low back pain; Multiple myeloma ([place]); and Neuropathy.    Past Surgical History:   has a past surgical history that includes bone marrow biopsy; other surgical history (Left, 08/17/2016); bone marrow transplant; and pre-malignant / benign skin lesion excision (Left, 03/29/2017).    Medications:   Scheduled Meds:   . fluconazole  200 mg Oral Daily   . levofloxacin  500 mg Oral Nightly   . lidocaine 1 % injection  5 mL Intradermal Once   . sodium chloride flush  10 mL Intravenous 2 times per day   . allopurinol  300 mg Oral Daily   . sodium chloride  250 mL Intravenous Once   . ondansetron  24 mg Oral Q24H   . [START ON 04/27/2017] furosemide  40 mg Intravenous Q12H   . pantoprazole  40 mg Oral QAM AC   . dexamethasone  40 mg Oral Daily   . Saline Mouthwash  15 mL Swish & Spit 4x Daily AC & HS   . gabapentin  300 mg Oral TID   . valACYclovir  500 mg Oral BID   . zolpidem  5 mg Oral Nightly   . budesonide  0.25 mg Nebulization BID   . albuterol  2.5 mg Nebulization 4x daily     Continuous Infusions:   . cyclophosphamide/etoposide/CISplatin chemo infusion     . sodium chloride 100 mL/hr at 04/25/17 2108     PRN Meds: sodium chloride flush, guaiFENesin, prochlorperazine **OR** prochlorperazine,  alteplase, magnesium hydroxide, Saline Mouthwash, naloxone, acetaminophen, magnesium sulfate, potassium chloride **OR** potassium chloride **OR** potassium chloride, oxyCODONE **OR** oxyCODONE, morphine **OR** morphine, LORazepam **OR** LORazepam, senna, polyethylene glycol  Home Meds:   Prior to Admission medications    Medication Sig Start Date End Date Taking? Authorizing Provider   NONFORMULARY    Yes Historical Provider, MD   Fluticasone-Salmeterol (ADVAIR DISKUS IN) Inhale into the lungs   Yes Historical Provider, MD   atorvastatin (LIPITOR) 10 MG tablet Take 10 mg by mouth daily   Yes Historical Provider, MD   lenalidomide (REVLIMID) 10 MG chemo capsule Take 10 mg by mouth daily   Yes Historical Provider, MD   gabapentin (NEURONTIN) 300 MG capsule Take 300 mg by mouth 4 times daily. .   Yes Historical Provider, MD   valACYclovir (VALTREX) 500 MG tablet Take 500 mg by mouth 2 times daily   Yes Historical Provider, MD     Coumadin Use Last 7 Days:  no  Antiplatelet drug therapy use last 7 days: no  Other anticoagulant use last 7 days: no  Additional Medication Information:  -      Pre-Sedation Documentation and  Exam:   Vital signs have been reviewed (see flow sheet for vitals).    Mallampati Airway Assessment:  Mallampati Class II - (soft palate, fauces & uvula are visible)    Prior History of Anesthesia Complications:   none    ASA Classification:  Class 2 - A normal healthy patient with mild systemic disease    Sedation/ Anesthesia Plan:   intravenous sedation    Medications Planned:   midazolam (Versed) intravenously and fentanyl intravenously    Patient is an appropriate candidate for plan of sedation: yes    Electronically signed by [name], MD on 04/26/2017 at 3:40pm

## 2017-04-26 NOTE — Progress Notes (Signed)
PT/INR & thrombin results noted. Pt is without evidence of DIC or major bleeding issues. Does have small amount of petechiae to trunk & BLE's; small patch on soft palate. No other evidence of bleeding. Hemodynamically stable. Discussed lab results with Dr. Hunt Oris. Laboratory results in no way match pts clinical exam or assessment. Believe this to be instrumentation error given pts florid relapse of MM and elevated serum protein. Will cont to monitor pt clinically for now.

## 2017-04-27 LAB — BASIC METABOLIC PANEL
Anion Gap: 8 (ref 3–16)
BUN: 20 mg/dL (ref 7–20)
CO2: 21 mmol/L (ref 21–32)
Calcium: 7.9 mg/dL — ABNORMAL LOW (ref 8.3–10.6)
Chloride: 106 mmol/L (ref 99–110)
Creatinine: 1 mg/dL (ref 0.9–1.3)
GFR African American: >60 (ref >60)
GFR Non-African American: >60 (ref >60)
Glucose: 123 mg/dL — ABNORMAL HIGH (ref 70–99)
Potassium: 3.9 mmol/L (ref 3.5–5.1)
Sodium: 135 mmol/L — ABNORMAL LOW (ref 136–145)

## 2017-04-27 LAB — CBC WITH AUTO DIFFERENTIAL
Atypical Lymphocytes Relative: 6 % (ref 0–6)
Bands Relative: 4 % (ref 0–7)
Basophils %: 0 %
Basophils Absolute: 0 10*3/uL (ref 0.0–0.2)
Eosinophils %: 0 %
Eosinophils Absolute: 0 10*3/uL (ref 0.0–0.6)
Hematocrit: 19.9 % — CL (ref 40.5–52.5)
Hemoglobin: 6.9 g/dL — CL (ref 13.5–17.5)
Lymphocytes %: 38 %
Lymphocytes Absolute: 1.1 10*3/uL (ref 1.0–5.1)
MCH: 29.4 pg (ref 26.0–34.0)
MCHC: 34.6 g/dL (ref 31.0–36.0)
MCV: 84.8 fL (ref 80.0–100.0)
MPV: 7.6 fL (ref 5.0–10.5)
Metamyelocytes Relative: 1 % — AB
Monocytes %: 17 %
Monocytes Absolute: 0.4 10*3/uL (ref 0.0–1.3)
Myelocyte Percent: 1 % — AB
Neutrophils %: 32 %
Neutrophils Absolute: 0.9 10*3/uL — CL (ref 1.7–7.7)
Platelets: 46 10*3/uL — ABNORMAL LOW (ref 135–450)
Promyelocytes Percent: 1 % — AB
RBC: 2.35 M/uL — ABNORMAL LOW (ref 4.20–5.90)
RDW: 16.1 % — ABNORMAL HIGH (ref 12.4–15.4)
WBC: 2.4 10*3/uL — ABNORMAL LOW (ref 4.0–11.0)

## 2017-04-27 LAB — PREPARE RBC (CROSSMATCH): Dispense Status Blood Bank: TRANSFUSED

## 2017-04-27 LAB — EPSTEIN-BARR VIRUS DNA, QUANTITATIVE
EBV, Quant. Copy/ML: <390 {copies}/mL
EBV, Quant. Interp: NOT DETECTED
EBV, Quant. Log: <2.6 {Log}

## 2017-04-27 LAB — ELECTROPHORESIS PROTEIN, SERUM
Albumin: 3.2 g/dL (ref 3.1–4.9)
Alpha-1-Globulin: 0.8 g/dL — ABNORMAL HIGH (ref 0.2–0.4)
Alpha-2-Globulin: 1.7 g/dL — ABNORMAL HIGH (ref 0.4–1.1)
Beta Globulin: 1.6 g/dL (ref 0.9–1.6)
Gamma Globulin: 7.2 g/dL — ABNORMAL HIGH (ref 0.6–1.8)
M Spike 1, Electrophoresis Protein Serum: 6.7 g/dL

## 2017-04-27 LAB — HEPATIC FUNCTION PANEL
ALT: 71 U/L — ABNORMAL HIGH (ref 10–40)
AST: 102 U/L — ABNORMAL HIGH (ref 15–37)
Albumin: 2.5 g/dL — ABNORMAL LOW (ref 3.4–5.0)
Alkaline Phosphatase: 181 U/L — ABNORMAL HIGH (ref 40–129)
Bilirubin, Direct: <0.2 mg/dL (ref 0.0–0.3)
Total Bilirubin: 0.6 mg/dL (ref 0.0–1.0)
Total Protein: 11.3 g/dL — ABNORMAL HIGH (ref 6.4–8.2)

## 2017-04-27 LAB — KAPPA/LAMBDA QUANTITATIVE FREE LIGHT CHAINS, SERUM
K/L RATIO: 30.55 — ABNORMAL HIGH (ref 0.26–1.65)
KAPPA, FREE LIGHT CHAINS, SERUM: 637 mg/L — ABNORMAL HIGH (ref 3.30–19.40)
LAMBDA, FREE LIGHT CHAINS, SERUM: 20.85 mg/L (ref 5.71–26.30)

## 2017-04-27 LAB — URIC ACID: Uric Acid, Serum: 2.7 mg/dL — ABNORMAL LOW (ref 3.5–7.2)

## 2017-04-27 LAB — LACTATE DEHYDROGENASE: LD: 572 U/L — ABNORMAL HIGH (ref 100–190)

## 2017-04-27 LAB — PHOSPHORUS: Phosphorus: 2.8 mg/dL (ref 2.5–4.9)

## 2017-04-27 LAB — ASPERGILLUS GALACTOMANNAN AG ASSAY
Aspergillus Galacto AG: NEGATIVE
Aspergillus Galacto Index: 0.14

## 2017-04-27 MED ORDER — SENNOSIDES 8.6 MG PO TABS
8.6 | Freq: Two times a day (BID) | ORAL | Status: DC
Start: 2017-04-27 — End: 2017-04-30
  Administered 2017-04-27 – 2017-04-30 (×7): 17.2 via ORAL

## 2017-04-27 MED ORDER — SODIUM CHLORIDE 0.9 % IV BOLUS
0.9 % | Freq: Once | INTRAVENOUS | Status: DC
Start: 2017-04-27 — End: 2017-04-27
  Administered 2017-04-27: 12:00:00 250 mL via INTRAVENOUS

## 2017-04-27 MED ORDER — FUROSEMIDE 10 MG/ML IJ SOLN
10 MG/ML | Freq: Once | INTRAMUSCULAR | Status: DC
Start: 2017-04-27 — End: 2017-04-27

## 2017-04-27 MED ORDER — AMLODIPINE BESYLATE 5 MG PO TABS
5 MG | Freq: Every day | ORAL | Status: DC
Start: 2017-04-27 — End: 2017-05-01
  Administered 2017-04-27 – 2017-05-01 (×5): 5 mg via ORAL

## 2017-04-27 MED ORDER — FUROSEMIDE 40 MG PO TABS
40 MG | Freq: Two times a day (BID) | ORAL | Status: DC
Start: 2017-04-27 — End: 2017-05-01
  Administered 2017-04-27 – 2017-04-30 (×7): 40 mg via ORAL

## 2017-04-27 MED FILL — GABAPENTIN 300 MG PO CAPS: 300 MG | ORAL | Qty: 1

## 2017-04-27 MED FILL — ALLOPURINOL 300 MG PO TABS: 300 MG | ORAL | Qty: 1

## 2017-04-27 MED FILL — VALACYCLOVIR HCL 500 MG PO TABS: 500 MG | ORAL | Qty: 1

## 2017-04-27 MED FILL — PANTOPRAZOLE SODIUM 40 MG PO TBEC: 40 MG | ORAL | Qty: 1

## 2017-04-27 MED FILL — ONDANSETRON HCL 8 MG PO TABS: 8 MG | ORAL | Qty: 3

## 2017-04-27 MED FILL — SODIUM CHLORIDE 0.9 % IV SOLN: 0.9 % | INTRAVENOUS | Qty: 250

## 2017-04-27 MED FILL — CYCLOPHOSPHAMIDE 1 G IJ SOLR: 1 GM | INTRAMUSCULAR | Qty: 40

## 2017-04-27 MED FILL — DEXAMETHASONE 4 MG PO TABS: 4 MG | ORAL | Qty: 10

## 2017-04-27 MED FILL — BUDESONIDE 0.25 MG/2ML IN SUSP: 0.25 MG/2ML | RESPIRATORY_TRACT | Qty: 2

## 2017-04-27 MED FILL — GUAIFENESIN 100 MG/5ML PO SOLN: 100 MG/5ML | ORAL | Qty: 10

## 2017-04-27 MED FILL — FUROSEMIDE 10 MG/ML IJ SOLN: 10 MG/ML | INTRAMUSCULAR | Qty: 4

## 2017-04-27 MED FILL — SODIUM CHLORIDE 0.9 % IV SOLN: 0.9 % | INTRAVENOUS | Qty: 1000

## 2017-04-27 MED FILL — SENNA 8.6 MG PO TABS: 8.6 MG | ORAL | Qty: 2

## 2017-04-27 MED FILL — ALBUTEROL SULFATE (2.5 MG/3ML) 0.083% IN NEBU: RESPIRATORY_TRACT | Qty: 3

## 2017-04-27 MED FILL — FLUCONAZOLE 200 MG PO TABS: 200 MG | ORAL | Qty: 1

## 2017-04-27 MED FILL — OXYCODONE HCL 5 MG PO TABS: 5 MG | ORAL | Qty: 1

## 2017-04-27 MED FILL — LEVAQUIN 500 MG PO TABS: 500 MG | ORAL | Qty: 1

## 2017-04-27 MED FILL — FUROSEMIDE 40 MG PO TABS: 40 MG | ORAL | Qty: 1

## 2017-04-27 MED FILL — ZOLPIDEM TARTRATE 5 MG PO TABS: 5 MG | ORAL | Qty: 1

## 2017-04-27 MED FILL — AMLODIPINE BESYLATE 5 MG PO TABS: 5 MG | ORAL | Qty: 1

## 2017-04-27 NOTE — Progress Notes (Signed)
[facility] Progress Note    04/27/2017     David Watkins    MRN: [id number]    DOB: 09/25/1955    Referring MD:  Eulis Canner, [place] [address]      SUBJECTIVE:  Frightened about diagnosis and prognosis.  Pain controlled.    ECOG PS:  (2) Ambulatory and capable of self care, unable to carry out work activity, up and about > 50% or waking hours    Isolation: None    Medications    Scheduled Meds:  . sodium chloride  250 mL Intravenous Once   . fluconazole  200 mg Oral Daily   . levofloxacin  500 mg Oral Nightly   . lidocaine 1 % injection  5 mL Intradermal Once   . sodium chloride flush  10 mL Intravenous 2 times per day   . allopurinol  300 mg Oral Daily   . sodium chloride  250 mL Intravenous Once   . ondansetron  24 mg Oral Q24H   . furosemide  40 mg Intravenous Q12H   . pantoprazole  40 mg Oral QAM AC   . dexamethasone  40 mg Oral Daily   . Saline Mouthwash  15 mL Swish & Spit 4x Daily AC & HS   . gabapentin  300 mg Oral TID   . valACYclovir  500 mg Oral BID   . zolpidem  5 mg Oral Nightly   . budesonide  0.25 mg Nebulization BID   . albuterol  2.5 mg Nebulization 4x daily     Continuous Infusions:  . cyclophosphamide/etoposide/CISplatin chemo infusion 41.7 mL/hr at 04/26/17 1823   . sodium chloride 100 mL/hr at 04/27/17 0332     PRN Meds:.sodium chloride flush, guaiFENesin, prochlorperazine **OR** prochlorperazine, alteplase, magnesium hydroxide, Saline Mouthwash, naloxone, acetaminophen, magnesium sulfate, potassium chloride **OR** potassium chloride **OR** potassium chloride, oxyCODONE **OR** oxyCODONE, morphine **OR** morphine, LORazepam **OR** LORazepam, senna, polyethylene glycol    ROS   Constitutional: Denies fever, sweats. + wt loss. Overwhelming fatigue & weakness   Eyes: No visual changes or diplopia. No scleral icterus.   ENT: No Headaches, hearing loss or vertigo. No mouth sores or sore throat.   Cardiovascular: No chest pain, +dyspnea on exertion, no palpitations or  loss of consciousness.    Respiratory: +dry cough, no wheezing, no sputum production. No hemoptysis.     Gastrointestinal: +RUQ pain, + appetite loss. No BM x3 days   Genitourinary: No dysuria, trouble voiding, or hematuria.   Musculoskeletal:  +generalized weakness. No joint complaints.   Integumentary: No rash or pruritis.   Neurological: No headache, diplopia. No change in gait, balance, or coordination. No paresthesias.   Endocrine: No temperature intolerance. No excessive thirst, fluid intake, or urination.    Hematologic/Lymphatic: +petechial rash to BLE's & trunk, no evidence of blood clots or swollen lymph nodes.   Allergic/Immunologic: No nasal congestion or hives.       Physical Exam:     I&O:      Intake/Output Summary (Last 24 hours) at 04/27/17 0833  Last data filed at 04/27/17 0601   Gross per 24 hour   Intake             4767 ml   Output              950 ml   Net             3817 ml  Vital Signs:  BP (!) 141/92   Pulse 64   Temp 97.4 F (36.3 C) (Temporal)   Resp 18   Ht 5' 9"  (1.753 m)   Wt 186 lb 4.8 oz (84.5 kg)   SpO2 97%   BMI 27.51 kg/m     Weight:    Wt Readings from Last 3 Encounters:   04/26/17 186 lb 4.8 oz (84.5 kg)   04/05/17 180 lb (81.6 kg)   03/29/17 189 lb 1.6 oz (85.8 kg)       General: Awake, alert and oriented, Anxious  HEENT: normocephalic, PERRL, no scleral erythema or icterus, Oral mucosa with petechiae to soft palate, blood blister to right buccal mucosa  NECK: supple without palpable adenopathy  SKIN: warm dry and intact without lesions rashes or masses. +Scattered petechiae to trunk & BLE's  CHEST: Diminished LS's bilaterally, dry nonproductive cough  CV: Normal S1 S2, RRR, no MRG  ABD: RUQ tenderness to palpation, ND normoactive BS, no palpable masses or hepatosplenomegaly  EXTREMITIES: without edema, denies calf tenderness  NEURO: CN II - XII grossly intact  CATHETER: RUE DL PICC (04/26/17 - IR) - site CDI    Data    CBC:   Recent Labs      04/25/17    0410  04/26/17   0349  04/26/17   1511  04/27/17   0330   WBC  2.2*  2.1*   --   2.4*   HGB  6.6*  7.1*   --   6.9*   HCT  19.4*  21.4*   --   19.9*   MCV  86.8  88.3   --   84.8   PLT  38*  14*  65*  46*     BMP/Mag:  Recent Labs      04/25/17   0410  04/26/17   0349  04/27/17   0330   NA  131*  134*  135*   K  4.6  4.3  3.9   CL  103  106  106   CO2  20*  18*  21   PHOS  3.9   --   2.8   BUN  18  19  20    CREATININE  1.4*  1.1  1.0     LIVP:   Recent Labs      04/25/17   0410  04/26/17   0349  04/27/17   0330   AST  55*  55*  102*   ALT  40  43*  71*   BILIDIR  <0.2  <0.2  <0.2   BILITOT  0.7  0.5  0.6   ALKPHOS  168*  152*  181*     Coags:   Recent Labs      04/25/17   0410  04/26/17   0821   PROTIME  >170.0*  com*   INR  >14.91*  com*   APTT   --   31.2     Uric Acid   Recent Labs      04/25/17   0410  04/27/17   0330   LABURIC  5.1  2.7*     DIAGNOSTIC IMAGING:  1. CT Chest 04/23/17:  1. Redemonstration of multiple lytic lesions throughout the skeleton compatible with patient's history of multiple myeloma.   2. 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 Stable scattered  submillimeter pulmonary parenchymal nodules.   4. Marked centrilobular emphysematous changes with upper lobe predominance, stable.   5. Progression of splenomegaly       PATHOLOGY:  1. BM Bx/Asp 04/24/17  Pending    2. Myeloma Studies 04/24/17:  SPEP/SIFE: monoclonal IgG kappa by immunofixation (6.65 g/dl)  SFLCs: pending  Immunoglobulins: IgA 106, IgG 6379, IgM 29  B2: 16.5  UPEP/UIFE: pending  UFLCs: pending    PROBLEM LIST:      IgG Kappa Multiple myeloma  Peripheral neuropathy  Depression  Erectile dysfunction  Hyperlipidemia  Hypertension  Insomnia  Low back pain    TREATMENT:      1. Rad Tx to T5-7, T10-L3, Right Scapula - 3000 cGy - Dr. Jamas Lav 03/20/16-04/01/16  2. RVD x1 03/20/16 - discontinued d/t rash   3.  Velcade/Pomalyst/Dex x3 cycles 04/23/16-06/17/16 - discontinued d/t reaction to pomalyst  4. Velcade/Dex x 1 cycle 06/26/16 (last dose of dexamethasone 07/22/16  5. High-dose melphalan followed by administration of PBSCs 2.36 x10^6 cd34cells/kg on 08/28/16  6. Maintenance Revlimid 87m daily (12/2016-04/23/17)  7. Dexamethasone 465mdaily (04/24/17)  8. DCEP (04/26/17)    ASSESSMENT AND PLAN:     1. IgG kappa [facility] myeloma: [facility] progressive anorexia, nonproductive cough, 17 pound wt loss, progressiveweakness, DOE, & severe thrombocytopenia all concerning for disease relapse.  - h/o lytic lesions as well as cord compression at T6 and T11  - Initially PR after 5 cycles chemotherapy - M-spike is 0.4 &BM biopsy 2 % plasma cells prior to auto txp. Restaging post-transplant showed 1% plasma cells in the bone marrow, PET/CT 12/07/16 showedno hypermetabolic areas.  - Most recent MM studies 02/19/17: SPE w/monoclonal IgG kappa of 0.7 gm/dL, IgG 1910, SFLCs Kappa 65.8, Lambda 35.1, K/L 1.87.    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 ID: Afebrile, presenting with severe cough & SOB, pneumonia ruled out on chest CT  - Cont diflucan, levaquin, & valtrex ppx while neutropenic    Abx Hx:  Merrem 8/24-8/27/18  Azithromycin 8/24-8/27/18    ID W/U:  - Blood Cx 04/23/17 pending, NGTD  - Procalcitonin elevated at 1.53, Strep Pneumo UrAg neg, Galactomannan negative,  - Resp cx (viral & bacterial), fungitel, CMV & EBV PCR pending - low suspicion these will be + given CT findings    3. Heme: Pancytopenia r/t disease relapse  - Transfuse for Hgb < 7 and Platelets < 10K  - PRBC transfusion  today  - PT/INR unable to be evaluated by convention laboratory methods (presumably from elevated serum protein), therefore reported as grossly elevated. No evidence of clinical DIC or bleeding other than mild petechiae as noted above  - 8/26 - D-dimer 1476  - Factor 7 Assay 04/26/17 pending    4. Metabolic:Mild HypoNa; AKI POA improving. Elevated serum total protein c/w disease relapse. Wt increased from admit. Decreased uop  - Cont IVF hydration: NS @ 10017mr  - Risk for TLS: Cont Allopurinol daily. TLS labs daily. No evidence at this time  - Pt refusing lasix, explained need and risk for fluid overload  - Begin Lasix 40 mg po BID.  5. Pulmonary: Chronic Bronchitis/COPD. H/o pulmonary nodule.   - Pulm Nodule: Cont to be stable - Cont to Monitor. 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 Also w/emphysematous changes.  - Cont robitussin cough syrup q4h PRN(Codeine cough syrup w/increased sedation)    6. GI/Nutrition: Appetite and oral intake is poor d/t critical illness. No BM since prior to admission  - Cont low microbial diet   - Constipation: Miralax PRN. Scheduled Senna BID. Administered dulcolax x1 dose 8/27.    7. Acute Pain: Now w/acute pain to RUQ below ribs as well as to left chest rib pain  - Off all narcotics prior to admission  - Oxycodone PRN  - Previously followedwith pain specialist as outpt    8. Cardiac: h/o HLD. 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 HTN, uncontrolled begin Norvasc 5 mg  - Lipitor on hold for now  - Echo 07/13/16 w/abnormal (paradoxical) septal motion is present with preserved LVEF 55-60%.  - Start norvasc 64m daily (04/27/17)    9. Anxiety/Depression: Ongoing  - Ativan PRN    10. Insomnia:ongoing, has failed multiple sleep aids  - Cont  sonata qHS    11. Peripheral Neuropathy: Ongoing  - Cont Gabapentin 300 mg TID     12. 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  Contraindications to pharmacologic prophylaxis: Thrombocytopenia  Contraindications to mechanical prophylaxis: None      - Disposition: Potentially once chemotherapy completed and no other major toxicities present. (potentially Friday evening, Saturday morning). Neulasta OBI needed at d/c      BLoma Newton APRN - CNP     JHarlene Salts MD  [facility]  [phone]

## 2017-04-27 NOTE — Oncology Nurse Navigation (Signed)
Administration: Chemotherapy drug DCEP independently verified with Sherle Poe RN prior to administration.  Acknowledgement of informed consent for chemotherapy administration verified.  Original order, appropriateness of regimen, drug supplied, height, weight, BSA, dose calculations, expiration dates/times, drug appearance, and two patient identifiers were verified by both RNs.  Drug checked for vesicant/irritant status and for risk of hypersensitivity.  Most recent laboratory values and allergies, were reviewed.  Positive, brisk blood return via CVC was confirmed prior to administration. Chest x-ray for correct line placement reviewed. Orene Desanctis and [place] RN verified correct rate of chemotherapy and maintenance IV fluids.  Patient was educated on chemotherapy regimen prior to administration including indication for treatment related to disease & side effects of chemotherapy drug.  Patient verbalizes understanding of all instructions.    Completion of Chemotherapy: Monitoring during infusion done per policy, see Flowsheets.  Blood return verified before, during, and after infusion per policy; no signs of extravasation.  Pt tolerating chemotherapy well and without incident.  Chemotherapy infusion end time on the [facility].  Will continue to monitor.

## 2017-04-27 NOTE — Plan of Care (Addendum)
Problem: Bleeding:  Goal: Will show no signs and symptoms of excessive bleeding  Will show no signs and symptoms of excessive bleeding   Outcome: Ongoing  Patient's hemoglobin this AM:   Recent Labs      04/27/17   0330   HGB  6.9*     Patient's platelet count this AM:   Recent Labs      04/27/17   0330   PLT  46*    Thrombocytopenia Precautions in place.  Patient showing no signs or symptoms of active bleeding.  Transfusion not indicated at this time.  Patient verbalizes understanding of all instructions. Will continue to assess and implement POC. Call light within reach and hourly rounding in place.     Problem: Falls - Risk of:  Goal: Will remain free from falls  Will remain free from falls    Outcome: Ongoing  Pt is a High fall risk. See Leamon Arnt Fall Score and ABCDS Injury Risk assessments. Explained fall risk precautions to pt and family and rationale behind their use to keep the patient safe. Pt bed is in low position, side rails up, call light and belongings are in reach. Fall wristband applied and present on pts wrist.  Bed alarm on.  Pt encouraged to call for assistance. Will continue with hourly rounds for PO intake, pain needs, toileting and repositioning as needed.     Problem: Pain:  Goal: Pain level will decrease  Pain level will decrease   Outcome: Ongoing  Pt complained of rib pain throughout the night. Pt given oxycodone as needed for pain.    Problem: Infection - Central Venous Catheter-Associated Bloodstream Infection:  Goal: Will show no infection signs and symptoms  Will show no infection signs and symptoms   Outcome: Ongoing  CVC site remains free of signs/symptoms of infection. No drainage, edema, erythema, pain, or warmth noted at site. Dressing changes continue per protocol and on an as needed basis - see flowsheet.     Compliant with [facility] Bath Protocol:  Performed CHG bath today per [facility] protocol utilizing Bed bath with CHG wipes.  CVC site cleansed with CHG wipe over dressing, skin surrounding  dressing, and first 6" of IV tubing.  Pt tolerated well.  Continued to encourage daily CHG bathing per [facility] protocol.

## 2017-04-27 NOTE — Behavioral Health Treatment Team (Signed)
Psychology Progress Note    Patient: David Watkins  Psychologist attended clinical rounds with team and remained after to speak with pt. Pt expressed anger that he is relapsed and did not want to talk.  He says he feels he has a right to be angry and is lashing out at times.  Wife Georgina Snell, from whom the pt is separated but still lives with, is present whenever girlfriend [name] can't be there. Georgina Snell says although the situation is awkward and odd, she feels like he needs someone there all the time and she is willing to be present to help him.    Psychologist provided emotional support and encouragement and will continue to see pt and caregivers for routine visits through hospital stay.    Kazuko Clemence, Psy.D., ABPP

## 2017-04-27 NOTE — Progress Notes (Signed)
Nutrition Assessment    Type and Reason for Visit: Reassess    Nutrition Recommendations:   1. PO Diet: Continue current diet general, low microbial  2. ONS: Programmer, systems ONS bid  3. Monitor, record and encourage PO intake at all meals through admission.   4. Consume small/frequent meals and snacks to build appetite      Malnutrition Assessment:   Malnutrition Status: At risk for malnutrition   Context: Acute illness or injury   Findings of the 6 clinical characteristics of malnutrition (Minimum of 2 out of 6 clinical characteristics is required to make the diagnosis of moderate or severe Protein Calorie Malnutrition based on AND/ASPEN Guidelines):  1. Energy Intake-Less than or equal to 50%, greater than 7 days    2. Weight Loss- , unable to assess (wt loss noted from 7/30 to 8/6; no CBW assess wt status)  3. Fat Loss-Unable to assess,    4. Muscle Loss-Unable to assess,    5. Fluid Accumulation-No significant fluid accumulation,    6. Grip Strength-Not measured    Nutrition Diagnosis:    Problem: Inadequate oral intake   Etiology: related to  (acute illness)    . Signs and symptoms:  as evidenced by Diet history of poor intake    Nutrition Assessment:   Subjective Assessment: Follow-up for diet advancement, po intake and wt. Pt resting in chair, spouse present. Spouse reports his appetite is not fantastic. Pt with poor appetite. Snacks encouraged. Pt denies n/v. Pt reports he had a BM this am. Pt willing to try Magic Cup ONS.  Pt is s/p BM Bx/Asp on 04/24/17 .   Nutrition-Focused Physical Findings: Unmeasured output stool occurrence 1 x recorded today; no edema noted   Wound Type: None   Current Nutrition Therapies:   Oral Diet Orders: General, Low Microbial    Oral Diet intake: 26-50%, 51-75%   Oral Nutrition Supplement (ONS) Orders: None   Anthropometric Measures:   Ht: 5\' 9"  (175.3 cm)    Current Body Wt: 184 lb (83.5 kg)   % Weight Change: 4.7% loss,  1 week from 189 lb 7/30 to 180 lb  8/6. No CBW at this time   Ideal Body Wt: 160 lb (72.6 kg),    BMI Classification: BMI 25.0 - 29.9 Overweight   Comparative Standards (Estimated Nutrition Needs):   Estimated Daily Total Kcal: 5643-3295   Estimated Daily Protein (g): 100-117    Estimated Intake vs Estimated Needs: Intake Less Than Needs    Nutrition Risk Level: High    Nutrition Interventions:   Continue current diet, Start ONS  Continued Inpatient Monitoring    Nutrition Evaluation:    Evaluation: Goals set    Goals: Pt will consume and tolerate at least 75% of meals/supplements offered     Monitoring: Meal Intake, Supplement Intake, Pertinent Labs, Weight    See Adult Nutrition Doc Flowsheet for more detail.     Electronically signed by Campbell Stall, RD, LD on 04/27/17 at 2:40 PM    Contact Number: [phone]

## 2017-04-27 NOTE — Plan of Care (Signed)
Problem: Bleeding:  Goal: Will show no signs and symptoms of excessive bleeding  Will show no signs and symptoms of excessive bleeding   Patient's hemoglobin this AM:       Recent Labs      04/27/17   0330   HGB  6.9*     Patient's platelet count this AM:       Recent Labs      04/27/17   0330   PLT  46*    Thrombocytopenia Precautions in place.  Patient showing no signs or symptoms of active bleeding.  Patient transfused blood products per orders - see flowsheet.  Patient verbalizes understanding of all instructions. Will continue to assess and implement POC. Call light within reach and hourly rounding in place.     Problem: Falls - Risk of:  Goal: Will remain free from falls  Will remain free from falls     Pt is a High fall risk. See Leamon Arnt Fall Score and ABCDS Injury Risk assessments. Explained fall risk precautions to pt and family and rationale behind their use to keep the patient safe. Pt bed is in low position, side rails up, call light and belongings are in reach. Fall wristband applied and present on pts wrist.  Chair Alarm on.  Pt encouraged to call for assistance. Will continue with hourly rounds for PO intake, pain needs, toileting and repositioning as needed.       Problem: Pain:  Goal: Pain level will decrease  Pain level will decrease   Pt. States he has rib pain due to excessive coughing. Pt. Denied pain meds. Encouraged repositioning. Will continue to monitor.     Problem: Nutrition  Goal: Optimal nutrition therapy  Pt. Encouraged to eat. Pt states appetite is low. Nutritional supplement ordered. Will continue to encourage and monitor.     Problem: Infection - Central Venous Catheter-Associated Bloodstream Infection:  Goal: Will show no infection signs and symptoms  Will show no infection signs and symptoms   Pt afebrile throughout shift. CVC site remains free of signs/symptoms of infection. No drainage, edema, erythema, pain, or warmth noted at site. Dressing changes continue per protocol and  on an as needed basis - see flowsheet. Encouraged shower, but pt. Refused.  Continued to encourage daily CHG bathing per [facility] protocol.

## 2017-04-27 NOTE — Plan of Care (Signed)
Problem: Nutrition  Goal: Optimal nutrition therapy  Outcome: Ongoing  Nutrition Problem: Inadequate oral intake  Intervention: Food and/or Nutrient Delivery: Continue current diet, Start ONS  Nutritional Goals: Pt will consume and tolerate at least 75% of meals/supplements offered

## 2017-04-27 NOTE — Care Coordination-Inpatient (Signed)
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  8/28: Greeted patient upon return from IR. SO, [name], present.     Education  04/23/17:  Greeting patient on arrival to unit, states that  [facility] Navigator assisted him in letting family know of his admission to office.  8/28: Reviewed treatment calendar. Discussed possible symptoms to expect from this regimen.   Estranged wife is here today, and SO [name] is at work. Wife, Georgina Snell, states she wants to be here to support him and assist in his care, as [name] cannot be here so much due to work. She states Mishon is "not happy" that she is here, but she feels an obligation to him and to their children.     Discharge  Expect discharge to home in [place] area with family as caregivers. Lives with wife, Georgina Snell.     Pending

## 2017-04-28 LAB — BASIC METABOLIC PANEL
Anion Gap: 9 (ref 3–16)
Anion Gap: 10 (ref 3–16)
BUN: 33 mg/dL — ABNORMAL HIGH (ref 7–20)
BUN: 39 mg/dL — ABNORMAL HIGH (ref 7–20)
CO2: 20 mmol/L — ABNORMAL LOW (ref 21–32)
CO2: 20 mmol/L — ABNORMAL LOW (ref 21–32)
Calcium: 7 mg/dL — ABNORMAL LOW (ref 8.3–10.6)
Calcium: 7 mg/dL — ABNORMAL LOW (ref 8.3–10.6)
Chloride: 104 mmol/L (ref 99–110)
Chloride: 102 mmol/L (ref 99–110)
Creatinine: 1.1 mg/dL (ref 0.9–1.3)
Creatinine: 1.2 mg/dL (ref 0.9–1.3)
GFR African American: >60 (ref >60)
GFR African American: >60 (ref >60)
GFR Non-African American: >60 (ref >60)
GFR Non-African American: >60 (ref >60)
Glucose: 140 mg/dL — ABNORMAL HIGH (ref 70–99)
Glucose: 133 mg/dL — ABNORMAL HIGH (ref 70–99)
Potassium: 4.5 mmol/L (ref 3.5–5.1)
Potassium: 4 mmol/L (ref 3.5–5.1)
Sodium: 133 mmol/L — ABNORMAL LOW (ref 136–145)
Sodium: 132 mmol/L — ABNORMAL LOW (ref 136–145)

## 2017-04-28 LAB — URIC ACID
Uric Acid, Serum: 3.1 mg/dL — ABNORMAL LOW (ref 3.5–7.2)
Uric Acid, Serum: 3.5 mg/dL (ref 3.5–7.2)

## 2017-04-28 LAB — HEPATIC FUNCTION PANEL
ALT: 96 U/L — ABNORMAL HIGH (ref 10–40)
AST: 118 U/L — ABNORMAL HIGH (ref 15–37)
Albumin: 2.2 g/dL — ABNORMAL LOW (ref 3.4–5.0)
Alkaline Phosphatase: 195 U/L — ABNORMAL HIGH (ref 40–129)
Bilirubin, Direct: <0.2 mg/dL (ref 0.0–0.3)
Total Bilirubin: 0.7 mg/dL (ref 0.0–1.0)
Total Protein: 10.4 g/dL — ABNORMAL HIGH (ref 6.4–8.2)

## 2017-04-28 LAB — CBC WITH AUTO DIFFERENTIAL
Atypical Lymphocytes Relative: 5 % (ref 0–6)
Basophils %: 0 %
Basophils Absolute: 0 10*3/uL (ref 0.0–0.2)
Eosinophils %: 0 %
Eosinophils Absolute: 0 10*3/uL (ref 0.0–0.6)
Hematocrit: 22 % — ABNORMAL LOW (ref 40.5–52.5)
Hemoglobin: 7.5 g/dL — ABNORMAL LOW (ref 13.5–17.5)
Lymphocytes %: 29 %
Lymphocytes Absolute: 0.7 10*3/uL — ABNORMAL LOW (ref 1.0–5.1)
MCH: 29.2 pg (ref 26.0–34.0)
MCHC: 34.2 g/dL (ref 31.0–36.0)
MCV: 85.6 fL (ref 80.0–100.0)
MPV: 7.9 fL (ref 5.0–10.5)
Monocytes %: 12 %
Monocytes Absolute: 0.3 10*3/uL (ref 0.0–1.3)
Neutrophils %: 54 %
Neutrophils Absolute: 1.1 10*3/uL — ABNORMAL LOW (ref 1.7–7.7)
Platelets: 38 10*3/uL — ABNORMAL LOW (ref 135–450)
RBC: 2.58 M/uL — ABNORMAL LOW (ref 4.20–5.90)
RDW: 16.2 % — ABNORMAL HIGH (ref 12.4–15.4)
WBC: 2.1 10*3/uL — ABNORMAL LOW (ref 4.0–11.0)

## 2017-04-28 LAB — CULTURE, HSV

## 2017-04-28 LAB — CULTURE BLOOD #1: Blood Culture, Routine: NO GROWTH

## 2017-04-28 LAB — 1,3 BETA-D-GLUCAN: (1,3)-Beta-D-Glucan (Fungitell) Interpretation: UNDETERMINED — AB

## 2017-04-28 LAB — CULTURE, BLOOD 2: Culture, Blood 2: NO GROWTH

## 2017-04-28 LAB — PHOSPHORUS
Phosphorus: 5.1 mg/dL — ABNORMAL HIGH (ref 2.5–4.9)
Phosphorus: 4.9 mg/dL (ref 2.5–4.9)

## 2017-04-28 LAB — LACTATE DEHYDROGENASE: LD: 699 U/L — ABNORMAL HIGH (ref 100–190)

## 2017-04-28 LAB — MISC ARUP 1

## 2017-04-28 MED ORDER — FUROSEMIDE 40 MG PO TABS
40 MG | Freq: Once | ORAL | Status: AC
Start: 2017-04-28 — End: 2017-04-28
  Administered 2017-04-28: 14:00:00 40 mg via ORAL

## 2017-04-28 MED ORDER — ALUMINUM HYDROXIDE GEL 320 MG/5ML PO SUSP
320 MG/5ML | Freq: Three times a day (TID) | ORAL | Status: DC
Start: 2017-04-28 — End: 2017-05-01
  Administered 2017-04-28 – 2017-05-01 (×10): 640 mg via ORAL

## 2017-04-28 MED FILL — GABAPENTIN 300 MG PO CAPS: 300 MG | ORAL | Qty: 1

## 2017-04-28 MED FILL — VALACYCLOVIR HCL 500 MG PO TABS: 500 MG | ORAL | Qty: 1

## 2017-04-28 MED FILL — FLUCONAZOLE 200 MG PO TABS: 200 MG | ORAL | Qty: 1

## 2017-04-28 MED FILL — GUAIFENESIN 100 MG/5ML PO SOLN: 100 MG/5ML | ORAL | Qty: 10

## 2017-04-28 MED FILL — LEVAQUIN 500 MG PO TABS: 500 MG | ORAL | Qty: 1

## 2017-04-28 MED FILL — ALUMINUM HYDROXIDE GEL 320 MG/5ML PO SUSP: 320 MG/5ML | ORAL | Qty: 10

## 2017-04-28 MED FILL — ALBUTEROL SULFATE (2.5 MG/3ML) 0.083% IN NEBU: RESPIRATORY_TRACT | Qty: 3

## 2017-04-28 MED FILL — ALLOPURINOL 300 MG PO TABS: 300 MG | ORAL | Qty: 1

## 2017-04-28 MED FILL — DEXAMETHASONE 4 MG PO TABS: 4 MG | ORAL | Qty: 10

## 2017-04-28 MED FILL — SODIUM CHLORIDE 0.9 % IV SOLN: 0.9 % | INTRAVENOUS | Qty: 1000

## 2017-04-28 MED FILL — ONDANSETRON HCL 8 MG PO TABS: 8 MG | ORAL | Qty: 3

## 2017-04-28 MED FILL — PANTOPRAZOLE SODIUM 40 MG PO TBEC: 40 MG | ORAL | Qty: 1

## 2017-04-28 MED FILL — FUROSEMIDE 40 MG PO TABS: 40 MG | ORAL | Qty: 1

## 2017-04-28 MED FILL — OXYCODONE HCL 5 MG PO TABS: 5 MG | ORAL | Qty: 2

## 2017-04-28 MED FILL — BUDESONIDE 0.25 MG/2ML IN SUSP: 0.25 MG/2ML | RESPIRATORY_TRACT | Qty: 2

## 2017-04-28 MED FILL — SENNA 8.6 MG PO TABS: 8.6 MG | ORAL | Qty: 2

## 2017-04-28 MED FILL — ZOLPIDEM TARTRATE 5 MG PO TABS: 5 MG | ORAL | Qty: 1

## 2017-04-28 MED FILL — AMLODIPINE BESYLATE 5 MG PO TABS: 5 MG | ORAL | Qty: 1

## 2017-04-28 NOTE — Plan of Care (Signed)
Problem: Musculor/Skeletal Functional Status  Intervention: PT Evaluation/treatment  Pt to improve functional mobility by discharge

## 2017-04-28 NOTE — Plan of Care (Signed)
Problem: Bleeding:  Goal: Will show no signs and symptoms of excessive bleeding  Will show no signs and symptoms of excessive bleeding   Outcome: Ongoing      Problem: Falls - Risk of:  Goal: Will remain free from falls  Will remain free from falls    Outcome: Ongoing  Pt is a fall risk.  Patient weak, Up with stand by assistance. Fall risk protocol in place. See Leamon Arnt Fall Score. Pt bed is in low position, bed alarm is on, side rails up, fall risk bracelet applied., non-skid footwear in use. Patient/family educated on fall risk protocol, instructed to call for assistance when needed and belongings are in reach.assistance. Will continue with hourly rounds for po intake, pain needs, toileting and repositioning as needed. Will continue to monitor for needs.     Problem: Pain:  Goal: Pain level will decrease  Pain level will decrease   Outcome: Ongoing  Complaining of rib pain rated 7/10.  Pain increases with cough.  Medicated with oxycodone 10mg  Po with relief.  Resting well overnight    Problem: Infection - Central Venous Catheter-Associated Bloodstream Infection:  Goal: Will show no infection signs and symptoms  Will show no infection signs and symptoms   Outcome: Ongoing  Has a right arm Dual PICC.  Site unremarkable.  Dressing intact.  Denies any pain or discomfort.  Flushes easily with brisk blood return    Problem: Emotional/Psychosocial  Goal: Emotional status will stabilize  Emotional status will stabilize    Outcome: Ongoing  Patient emotional over relapse, States "I just dont know what to think..."  Talks open about relapse and feelings.  Emotional support provided    Problem: RESPIRATORY  Goal: Respiratory status will improve  Respiratory status will improve    Outcome: Ongoing  Has a NPC, lungs clear but decreased.  Does develop occasional rhonchi that clears with cough.  Receiving PRN breathing treatments - denies need overnight    Problem: Skin Integrity - Impaired  Goal: Status of oral mucous membranes  will improve  Status of oral mucous membranes will improve    Outcome: Ongoing  Oral mucosa coated with healing blister.  Using salt rinses.  Able to take PO Fluids without difficulty    Problem: Fluid Volume - Imbalance  Goal: Fluid and electrolyte balance are achieved/maintained  Outcome: Ongoing  Has 1+ pitting BLE, Lungs clear but decreased.  Denies SOB.  Spo2 WNL    Problem: Activity:  Goal: Able to perform physical activity  Outcome: Ongoing  Stable/No Isolation Precautions:  Pt with activity orders for up ad lib.  Encouraged pt to be up OOB as much as possible throughout the day and for all meals.  Encouraged frequent short naps as necessary to preserve energy but instructed that while awake, pt should be OOB.    Encouraged pt to ambulate in halls.  Pt seen up ad lib in halls once this shift. Pt is visualized to be OOB 26-50% of the time this shift.  Will continue to encourage frequent activity.

## 2017-04-28 NOTE — Progress Notes (Addendum)
Physical Therapy    Facility/Department: [facility]  Initial Assessment and Treatment    NAME: RYKEN PASCHAL  DOB: Apr 01, 1956  MRN: [id number]    Date of Service: 04/28/2017    Discharge Recommendations:  SHAYNE DIGUGLIELMO scored a 19/24 on the AM-PAC short mobility form. Current research shows that an AM-PAC score of 18 or greater is typically associated with a discharge to the patient's home setting. Based on the patient???s AM-PAC score and their current functional mobility deficits, it is recommended that the patient have 2-3 sessions per week of Physical Therapy at d/c to increase the patient???s independence.        PT Equipment Recommendations  Equipment Needed: No    Patient Diagnosis(es): There were no encounter diagnoses.     has a past medical history of Bone pain; Hyperlipidemia; Low back pain; Multiple myeloma ([place]); and Neuropathy.   has a past surgical history that includes bone marrow biopsy; other surgical history (Left, 08/17/2016); bone marrow transplant; and pre-malignant / benign skin lesion excision (Left, 03/29/2017).    Restrictions  Position Activity Restriction  Other position/activity restrictions: up as tolerated     Vision/Hearing  Vision: Impaired  Vision Exceptions: Wears glasses for reading  Hearing: Within functional limits       Subjective  General  Chart Reviewed: Yes  Additional Pertinent Hx: Pt admitted on 04/23/17 with multiple myeloma relapse.  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  Family / Caregiver Present: Yes (Estranged wife)  Referring Practitioner: Vicie Mutters APRN  Diagnosis: multiple myeloma relapse  Subjective  Subjective: Pt sitting up in chair and agreeable to therapy.  Pt asking about showering independently.  Pain Screening  Patient Currently in Pain: Denies     Orientation  Orientation  Overall Orientation Status: Within Normal Limits    Social/Functional History  Social/Functional History  Lives With:   (2 high school aged children)  Type of Home: House  Home Layout: Two level, 1/2 bath on main level, Bed/Bath upstairs (laundry main level)  Home Access: Stairs to enter without rails (2-3 steps in)  Bathroom Shower/Tub: Tourist information centre manager: Soil scientist: Built-in shower seat  ADL Assistance: [place]:  (assist every other weekend )  Homemaking Responsibilities: Yes  Ambulation Assistance: Independent  Transfer Assistance: Independent  Active Driver: Yes  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     Objective  AROM RLE (degrees)  RLE AROM: WFL  AROM LLE (degrees)  LLE AROM : WFL     Strength B LE  Comment: >3/5, NT formally due to low platelets     Sensation  Overall Sensation Status: Impaired (N and T B toes)     Transfers  Sit to Stand: Contact guard assistance  Stand to sit: Contact guard assistance     Ambulation  Ambulation?: Yes  Ambulation 1  Surface: level tile  Device: Rolling Walker;No Device  Assistance: Contact guard assistance  Quality of Gait: Wide BOS, B LE ER, reaching out for handrail at times, mildly unsteady, slow cadence  Distance: 275 feet  Comments: 1 standing rest break     Balance  Sitting - Static: Good  Standing - Dynamic:  (CGA )      Treatment:  Functional mobility training and pt education  Assessment   Assessment: Pt is 61 yo M with decreased overall mobility and dynamic balance.  Pt mildly unsteady with gait,  reaching out for objects for balance.  Pt asking about showering independently and coming off the alarm, but pt presently pt presents as a fall risk.  Emphasized to pt that therapy will follow him in hopes to assist in coming off the alarm as he gets stronger and more balanced.  Pt appears upset about relapse and requesting to speak with SW.  Will follow.  Treatment Diagnosis: Decreased functional mobility related to multiple myeloma relapse  Prognosis: Good  Decision Making: Medium Complexity  Patient Education: Role of PT-  pt verbalized understanding  REQUIRES PT FOLLOW UP: Yes  Activity Tolerance  Activity Tolerance: Patient limited by fatigue       Plan   Plan  Times per week: 2-5  Current Treatment Recommendations: Strengthening, Hotel manager, Therapist, nutritional, Personnel officer, Stair training, Training and development officer, Proofreader, Management consultant Devices  Type of devices: Call light within reach, Chair alarm in place, Left in chair, Nurse notified    G-Code  PT G-Codes  Functional Limitation: Mobility: Walking and moving around  Mobility: Walking and Moving Around Current Status [phone]): At least 40 percent but less than 60 percent impaired, limited or restricted  Mobility: Walking and Moving Around Goal Status [phone]): At least 20 percent but less than 40 percent impaired, limited or restricted    AM-PAC Score  AM-PAC Inpatient Mobility Raw Score : 19  AM-PAC Inpatient T-Scale Score : 45.44  Mobility Inpatient CMS 0-100% Score: 41.77  Mobility Inpatient CMS G-Code Modifier : CK    Goals  Short term goals  Time Frame for Short term goals: by discharge  Short term goal 1: Sit to stand with supervision  Short term goal 2: Pt will ambulate 400 feet with supervision  Short term goal 3: Pt will be independent with LE HEP x 10  Short term goal 4: Pt will ambulate up and down 1 flight steps with rail and SBA     Therapy Time   Individual Concurrent Group Co-treatment   Time In 1018         Time Out 1056         Minutes 38           Timed Code Treatment Minutes:   23    Total Treatment Minutes:  38     If pt d/c'd prior to next treatment, this note serves as a discharge note.  [place], [place]

## 2017-04-28 NOTE — Progress Notes (Signed)
Occupational Therapy   Occupational Therapy Initial Assessment/tx  Date: 04/28/2017   Patient Name: David Watkins  MRN: [id number]     DOB: August 19, 1956    Date of Service: 04/28/2017    Discharge Recommendations:  BRENNER VISCONTI scored a 22/24 on the AM-PAC ADL Inpatient form. Current research shows that an AM-PAC score of 18 or greater is typically associated with a discharge to the patient's home setting. Based on the patient's AM-PAC score and their current ADL deficits, it is recommended that the patient have 2-3 sessions per week of Occupational Therapy at d/c to increase the patient's independence.       OT Equipment Recommendations  Other: continue to assess need for bathroom DME      Patient Diagnosis(es): There were no encounter diagnoses.     has a past medical history of Bone pain; Hyperlipidemia; Low back pain; Multiple myeloma ([place]); and Neuropathy.   has a past surgical history that includes bone marrow biopsy; other surgical history (Left, 08/17/2016); bone marrow transplant; and pre-malignant / benign skin lesion excision (Left, 03/29/2017).    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  At end of session, pt stated, "I just want to take a shower.  Is it too much for a man to take a shower."  Nursing notified pt's request and that he needs SBA and use of shower chair.  Pain Assessment  Patient Currently in  Pain: Denies    Social/Functional History  Social/Functional History  Lives With:  (2 high school aged children)  Type of Home: House  Home Layout: Two level, 1/2 bath on main level, Bed/Bath upstairs (laundry main level)  Home Access: Stairs to enter without rails (2-3 steps in)  Bathroom Shower/Tub: Tourist information centre manager: Soil scientist: Built-in shower seat  ADL Assistance: Conservator, museum/gallery Assistance:  (assist every other weekend )  Homemaking Responsibilities: Yes  Ambulation Assistance: Independent  Transfer Assistance: Teacher, English as a foreign language: Yes  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       Objective        Orientation  Overall Orientation Status: Within Normal Limits     Standing Balance  Sit to stand: Contact guard assistance  Stand to sit: Contact guard assistance  Functional Mobility  Functional Mobility Comments: functional mobility in hall with CGA, pt unsteady at times, holding on to railing intermittently, needing occ standing rest breaks  Ecologist Transfers Comments: pt declined  ADL  LE Dressing:  (per pt report, indep donning pants, indep doffing/donning crocs)           Transfers  Sit to stand: Contact guard assistance  Stand to sit: Contact guard assistance     Cognition  Overall Cognitive Status: WFL  Cognition Comment: upset about medical condition and not being able to ambulate and shower indep        Sensation  Overall Sensation Status: Impaired (numbness and tingling hands  and feet)        LUE AROM (degrees)  LUE General AROM: ~100 shoulder flex, elbow to hand=WNL  Left Hand AROM (degrees)  Left Hand AROM: WNL  RUE AROM (degrees)  RUE General AROM: ~70 shoulder flex, elbow to hand=WFL  Right Hand AROM (degrees)  Right Hand AROM: WNL  LUE Strength  Gross LUE Strength:  (3-/5 shoulder)  RUE Strength  Gross RUE Strength:  (3-/5 shoulder)     Hand Dominance  Hand Dominance: Right     Patient participated in OT eval and tx including ADLs and  transfer       Assessment   Performance deficits / Impairments: Decreased functional mobility ;Decreased ADL status;Decreased ROM  Assessment: Pt is functioning below baseline level, needing A with functional transfers, having limited tolerance to tasks.  Pt wants to be indep with ambulation and showering while in the hospital.  Recommend initial 24 hr A at home.  Pending progress, no ongoing OT at discharge.  Treatment Diagnosis: impaired transfers secondary to multiple myeloma  Prognosis: Good  Decision Making: Low Complexity  Patient Education: role of OT, verbalized understanding  REQUIRES OT FOLLOW UP: Yes  Activity Tolerance  Activity Tolerance: Patient Tolerated treatment well  Activity Tolerance: needing standing rest breaks  Safety Devices  Safety Devices in place: Yes  Type of devices: Left in chair;Nurse notified;Call light within reach;Chair alarm in place         Plan   Plan  Times per week: 2-5  Times per day: Daily  Current Treatment Recommendations: Hotel manager, Therapist, nutritional, Endurance Training, Self-Care / ADL, ROM    G-Code  OT G-codes  Functional Limitation: Self care  Self Care Current Status [phone]): At least 20 percent but less than 40 percent impaired, limited or restricted  Self Care Goal Status ([id number]): At least 1 percent but less than 20 percent impaired, limited or restricted  OutComes Score                                           AM-PAC Score        AM-PAC Inpatient Daily Activity Raw Score: 22  AM-PAC Inpatient ADL T-Scale Score : 47.1  ADL Inpatient CMS 0-100% Score: 25.8  ADL Inpatient CMS G-Code Modifier : CJ    Goals  Short term goals  Time Frame for Short term goals: discharge  Short term goal 1: pt to participate in commode transfer assessment  Short term goal 2: pt to stand for 6 minutes with SBA while doing functional tasks and UE exerc  Short term goal 3: pt to do functional mobility in room with SBA  Short term goal 4: pt to tolerate 12 reps B UE AROM  exerc to increase ADL indep  Patient Goals   Patient goals : to be indep showering       Therapy Time   Individual Concurrent Group Co-treatment   Time In 1018         Time Out 1056         Minutes 38           Timed Code Treatment Minutes:   23    Total Treatment Minutes:  38       If patient is d/c prior to next treatment session, this note will serve as the discharge summary  Ubaldo Glassing, OTR/[id number]

## 2017-04-28 NOTE — Progress Notes (Signed)
RT here for RX pt sound asleep

## 2017-04-28 NOTE — Progress Notes (Signed)
Problem: Bleeding:  Goal: Will show no signs and symptoms of excessive bleeding  Will show no signs and symptoms of excessive bleeding   Outcome: Ongoing  Patient's hemoglobin this AM:   Recent Labs      04/28/17   0300   HGB  7.5*     Patient's platelet count this AM:   Recent Labs      04/28/17   0300   PLT  38*    Thrombocytopenia Precautions in place.  Patient showing no signs or symptoms of active bleeding.  Transfusion not indicated at this time.  Patient verbalizes understanding of all instructions. Will continue to assess and implement POC. Call light within reach and hourly rounding in place.   ??  Problem: Falls - Risk of:  Goal: Will remain free from falls  Will remain free from falls    Outcome: Ongoing  Remains on chair alarm for weakness. Patient minimal assist. Uses call light appropriately.  ??  Problem: Pain:  Goal: Pain level will decrease  Pain level will decrease   Outcome: Ongoing  Complained of ribcage pain this am. Oxycodone given. No further complaints  ??  Problem: Nutrition  Goal: Optimal nutrition therapy  Outcome: Ongoing  Eating some at meals. Patient ate better at dinner time. Approximately half of meal.  ??  Problem: Infection - Central Venous Catheter-Associated Bloodstream Infection:  Goal: Will show no infection signs and symptoms  Will show no infection signs and symptoms   Outcome: Ongoing  PICC line without signs or symptoms of infection. No drainage, edema, erythema, pain, or warmth noted at site. Dressing changes continue per protocol and on an as needed basis - see flowsheet.     Compliant with [facility] Bath Protocol:  Performed CHG bath today per [facility] protocol utilizing CHG solution in the shower.  CVC site cleansed with CHG wipe over dressing, skin surrounding dressing, and first 6" of IV tubing.  Pt tolerated well.  Continued to encourage daily CHG bathing per [facility] protocol.    Problem: Emotional/Psychosocial  Goal: Emotional status will stabilize  Emotional status will stabilize      Outcome: Ongoing  Patient verbalized frustration with visitors questions about eating and walking. Patient also talked about doing what he has to to "fight this"  ??  Problem: Skin Integrity - Impaired  Goal: Status of oral mucous membranes will improve  Status of oral mucous membranes will improve     Outcome: Ongoing  Skin intact  ??  Problem: Fluid Volume - Imbalance  Goal: Fluid and electrolyte balance are achieved/maintained  Outcome: Ongoing  Received lasix as ordered.  See I&O flowsheet. Labs monitored.  ??  Problem: Activity:  Goal: Able to perform physical activity  Outcome: Ongoing  Stable/No Isolation Precautions:  Pt with activity orders for up ad lib.  Encouraged pt to be up OOB as much as possible throughout the day and for all meals.  Encouraged frequent short naps as necessary to preserve energy but instructed that while awake, pt should be OOB.    Encouraged pt to ambulate in halls.  Pt seen up ad lib in halls once this shift. Pt is visualized to be OOB 76-100% of the time this shift.  Will continue to encourage frequent activity.      Problem: Discharge Planning:  Goal: Discharged to appropriate level of care  Discharged to appropriate level of care   Outcome: Ongoing  Continue to reinforce and involve patient in plan of care.  ??  Problem: PROTECTIVE PRECAUTIONS  Goal: Patient will remain free of nosocomial Infections  Outcome: Ongoing  Protective precautions in place  ??  ??

## 2017-04-28 NOTE — Plan of Care (Signed)
Problem: Musculor/Skeletal Functional Status  Intervention: OT Evaluation/treatment  Pt to increase ADL indep

## 2017-04-28 NOTE — Other (Signed)
04/28/17 1023   Encounter Summary   Services provided to: Patient and family together  (wife)   Referral/Consult From: Rounding   Continue Visiting (es 8/29)   Complexity of Encounter Low   Length of Encounter 15 minutes   Routine   Type Initial   Assessment Approachable   Intervention Active listening;Contacted support as requested per patient/family request   Who? Chanetta Marshall   Why? request for social worker   At Request Of patient   Outcome Receptive

## 2017-04-28 NOTE — Progress Notes (Signed)
[facility] Progress Note    04/28/2017     David Watkins    MRN: [id number]    DOB: [birth year]    Referring MD:  Eulis [place] [address]      SUBJECTIVE:  C/O cough and dyspnea.    ECOG PS:  (2) Ambulatory and capable of self care, unable to carry out work activity, up and about > 50% or waking hours    Isolation: None    Medications    Scheduled Meds:  . amLODIPine  5 mg Oral Daily   . senna  2 tablet Oral BID   . furosemide  40 mg Oral BID   . fluconazole  200 mg Oral Daily   . levofloxacin  500 mg Oral Nightly   . lidocaine 1 % injection  5 mL Intradermal Once   . sodium chloride flush  10 mL Intravenous 2 times per day   . allopurinol  300 mg Oral Daily   . ondansetron  24 mg Oral Q24H   . pantoprazole  40 mg Oral QAM AC   . dexamethasone  40 mg Oral Daily   . Saline Mouthwash  15 mL Swish & Spit 4x Daily AC & HS   . gabapentin  300 mg Oral TID   . valACYclovir  500 mg Oral BID   . zolpidem  5 mg Oral Nightly   . budesonide  0.25 mg Nebulization BID   . albuterol  2.5 mg Nebulization 4x daily     Continuous Infusions:  . cyclophosphamide/etoposide/CISplatin chemo infusion 41.7 mL/hr at 04/27/17 2041   . sodium chloride 100 mL/hr at 04/27/17 1542     PRN Meds:.sodium chloride flush, guaiFENesin, prochlorperazine **OR** prochlorperazine, alteplase, magnesium hydroxide, Saline Mouthwash, acetaminophen, magnesium sulfate, potassium chloride **OR** potassium chloride **OR** potassium chloride, oxyCODONE **OR** oxyCODONE, LORazepam **OR** LORazepam, polyethylene glycol    ROS   Constitutional: Denies fever, sweats. + wt loss. Overwhelming fatigue & weakness   Eyes: No visual changes or diplopia. No scleral icterus.   ENT: No Headaches, hearing loss or vertigo. No mouth sores or sore throat.   Cardiovascular: No chest pain, +dyspnea on exertion, no palpitations or loss of consciousness.    Respiratory: +dry cough, no wheezing, no sputum production. No hemoptysis.      Gastrointestinal: +RUQ pain, + appetite loss. No BM x3 days   Genitourinary: No dysuria, trouble voiding, or hematuria.   Musculoskeletal:  +generalized weakness. No joint complaints.   Integumentary: No rash or pruritis.   Neurological: No headache, diplopia. No change in gait, balance, or coordination. No paresthesias.   Endocrine: No temperature intolerance. No excessive thirst, fluid intake, or urination.    Hematologic/Lymphatic: +petechial rash to BLE's & trunk, no evidence of blood clots or swollen lymph nodes.   Allergic/Immunologic: No nasal congestion or hives.       Physical Exam:     I&O:      Intake/Output Summary (Last 24 hours) at 04/28/17 0808  Last data filed at 04/28/17 0602   Gross per 24 hour   Intake             4379 ml   Output             4175 ml   Net              204 ml       Vital Signs:  BP 135/89   Pulse 72  Temp 97.8 F (36.6 C) (Oral)   Resp 22   Ht '5\' 9"'$  (1.753 m)   Wt 184 lb 8 oz (83.7 kg)   SpO2 97%   BMI 27.25 kg/m     Weight:    Wt Readings from Last 3 Encounters:   04/27/17 184 lb 8 oz (83.7 kg)   04/05/17 180 lb (81.6 kg)   03/29/17 189 lb 1.6 oz (85.8 kg)       General: Awake, alert and oriented, Anxious  HEENT: normocephalic, PERRL, no scleral erythema or icterus, Oral mucosa with petechiae to soft palate, blood blister to right buccal mucosa  NECK: supple without palpable adenopathy  SKIN: warm dry and intact without lesions rashes or masses. +Scattered petechiae to trunk & BLE's  CHEST: Diminished LS's bilaterally, dry nonproductive cough  CV: Normal S1 S2, RRR, no MRG  ABD: RUQ tenderness to palpation, ND normoactive BS, no palpable masses or hepatosplenomegaly  EXTREMITIES: without edema, denies calf tenderness  NEURO: CN II - XII grossly intact  CATHETER: RUE DL PICC (04/26/17 - IR) - site CDI    Data    CBC:   Recent Labs      04/26/17   0349  04/26/17   1511  04/27/17   0330  04/28/17   0300   WBC  2.1*   --   2.4*  2.1*   HGB  7.1*   --   6.9*  7.5*    HCT  21.4*   --   19.9*  22.0*   MCV  88.3   --   84.8  85.6   PLT  14*  65*  46*  38*     BMP/Mag:  Recent Labs      04/26/17   0349  04/27/17   0330  04/28/17   0300   NA  134*  135*  133*   K  4.3  3.9  4.5   CL  106  106  104   CO2  18*  21  20*   PHOS   --   2.8  5.1*   BUN  19  20  33*   CREATININE  1.1  1.0  1.1     LIVP:   Recent Labs      04/26/17   0349  04/27/17   0330  04/28/17   0300   AST  55*  102*  118*   ALT  43*  71*  96*   BILIDIR  <0.2  <0.2  <0.2   BILITOT  0.5  0.6  0.7   ALKPHOS  152*  181*  195*     Coags:   Recent Labs      04/26/17   0821   PROTIME  com*   INR  com*   APTT  31.2     Uric Acid   Recent Labs      04/27/17   0330  04/28/17   0300   LABURIC  2.7*  3.1*     DIAGNOSTIC IMAGING:  1. CT Chest 04/23/17:  1. Redemonstration of multiple lytic lesions throughout the skeleton compatible with patient's history of multiple myeloma.   2. 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 Stable scattered submillimeter pulmonary parenchymal nodules.   4. Marked centrilobular emphysematous changes with upper lobe predominance, stable.   5. Progression of splenomegaly  PATHOLOGY:  1. BM Bx/Asp 04/24/17  FINAL DIAGNOSIS:    Bone marrow biopsy, clot section, aspirate smear, touch imprint,  peripheral blood smear and flow cytometry data:  Plasma cell leukemia. See comment      COMMENT: There is a single continuous sheet of atypical plasma cells  (shown to be kappa monotypic via flow cytometry) effacing the bone  marrow. The cellularity is 100% and there is minimal evidence of  normal-appearing hematopoietic activity. Genetic studies are pending.  The morphologic features are most consistent with the plasmablastic  variant. There is a patchy 3+ increase in reticulin fibers.    2. Myeloma Studies 04/24/17:  SPEP/SIFE: monoclonal IgG kappa by immunofixation (6.65 g/dl)  SFLCs:   KAPPA, FREE LIGHT  CHAINS, SERUM 637.00   3.30 - 19.40 mg/L Final 04/24/2017 4:18 AM MH - [facility]   LAMBDA, FREE LIGHT CHAINS, SERUM 20.85  5.71 - 26.30 mg/L Final 04/24/2017 4:18 AM MH - [facility]   K/L RATIO 30.55   0.26 - 1.65          Immunoglobulins: IgA 106, IgG 6379, IgM 29  B2: 16.5  UPEP/UIFE: Urine IFE shows a polyclonal increase in free kappa and/or   free lambda light chains. No monoclonal free light chains   (Bence Jones Protein) are detected. Urine IFE shows a   monoclonal IgG heavy chain with associated kappa light   chain.   UFLCs:   Free Urinary Kappa Light Chains     150.00 H   mg/dL   Free Urinary Lambda Light Chain      9.06 H   mg/dL    Free Urinary Kappa/Lambda Ratio      16.56 H   ratio     PROBLEM LIST:      IgG Kappa Multiple myeloma  Peripheral neuropathy  Depression  Erectile dysfunction  Hyperlipidemia  Hypertension  Insomnia  Low back pain    TREATMENT:      1. Rad Tx to T5-7, T10-L3, Right Scapula - 3000 cGy - Dr. Jamas Lav 03/20/16-04/01/16  2. RVD x1 03/20/16 - discontinued d/t rash   3. Velcade/Pomalyst/Dex x3 cycles 04/23/16-06/17/16 - discontinued d/t reaction to pomalyst  4. Velcade/Dex x 1 cycle 06/26/16 (last dose of dexamethasone 07/22/16  5. High-dose melphalan followed by administration of PBSCs 2.36 x10^6 cd34cells/kg on 08/28/16  6. Maintenance Revlimid '10mg'$  daily (12/2016-04/23/17)  7. Dexamethasone '40mg'$  daily (04/24/17)  8. DCEP (04/26/17)    ASSESSMENT AND PLAN:     1. IgG kappa multiple myeloma: presented to [facility] office 04/23/17 with progressive anorexia, nonproductive cough, 17 pound wt loss, progressiveweakness, DOE, & severe thrombocytopenia all concerning for disease relapse.  - h/o lytic lesions as well as cord compression at T6 and T11  - Initially PR after 5 cycles chemotherapy - M-spike is 0.4 &BM biopsy 2 % plasma cells prior to auto txp. Restaging post-transplant showed 1% plasma cells in the bone marrow, PET/CT 12/07/16  showedno hypermetabolic areas.  - Most recent MM studies 02/19/17: SPE w/monoclonal IgG kappa of 0.7 gm/dL, IgG 1910, SFLCs Kappa 65.8, Lambda 35.1, K/L 1.87.    Disease Eval: [place] relapse of MM concerning for plasma cell leukemia, bone marrow with single continuous sheet of plasma cells, see above    DCEP Cycle 1, Day +3    2. ID: Afebrile, presenting with severe cough & SOB, pneumonia ruled out on chest CT  - Cont diflucan, levaquin, & valtrex ppx while neutropenic    Abx Hx:  Merrem 8/24-8/27/18  Azithromycin 8/24-8/27/18    ID W/U:  - Blood Cx 04/23/17 pending, NGTD  - Procalcitonin elevated at 1.53, Strep Pneumo UrAg neg, Galactomannan negative,  - Resp cx (viral & bacterial)-ordered but not sent; fungitel, CMV pending & EBV PCR neg - low suspicion these will be + given CT findings    3. Heme: Pancytopenia r/t disease relapse  - Transfuse for Hgb < 7 and Platelets < 10K  - No transfusion today  - PT/INR unable to be evaluated by convention laboratory methods (presumably from elevated serum protein), therefore reported as grossly elevated. No evidence of clinical DIC or bleeding other than mild petechiae as noted above  - 8/26 - D-dimer 1476  - Factor 7 Assay 04/26/17 pending    4. Metabolic:Mild HypoNa, metabolic acidosis, hyperPhos and increasing K+ (mild TLS); AKI POA improving. Elevated serum total protein c/w disease relapse. Wt increased from admit. Decreased uop  - Cont IVF hydration: NS @ 1102m/hr  - Risk for TLS: Cont Allopurinol daily. TLS labs daily.  -Start Alternagel 640 mg PO TID, monitor Phos daily  - Pt refusing lasix, explained need and risk for fluid overload  - Continue Lasix 40 mg po BID.  Give an extra 40 mg po in AM 8/29  -Discontinue potassium prn replacement, will monitor    5. Pulmonary: Chronic Bronchitis/COPD. H/o pulmonary nodule.   - Pulm Nodule: Cont to be stable - Cont to Monitor. 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 Also w/emphysematous changes.  - Cont robitussin cough syrup q4h PRN(Codeine cough syrup w/increased sedation)    6. GI/Nutrition: Appetite and oral intake is poor d/t critical illness.   - Cont low microbial diet   - Constipation: Miralax PRN. Scheduled Senna BID. Administered dulcolax x1 dose 8/27- BM 8/28    7. Acute Pain: Now w/acute pain to RUQ below ribs as well as to left chest rib pain  - Off all narcotics prior to admission  - Oxycodone PRN  - Previously followedwith pain specialist as outpt    8. Cardiac: h/o HLD. 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 HTN improved with norvasc  - Lipitor on hold for now  - Echo 07/13/16 w/abnormal (paradoxical) septal motion is present with preserved LVEF 55-60%.  - Continue norvasc '5mg'$  daily (04/27/17)    9. Anxiety/Depression: Ongoing  - Ativan PRN    10. Insomnia:ongoing, has failed multiple sleep aids  - Cont sonata qHS    11. Peripheral Neuropathy: Ongoing  - Cont Gabapentin 300 mg TID     12. 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  Contraindications to pharmacologic prophylaxis: Thrombocytopenia  Contraindications to mechanical prophylaxis: None      - Disposition: Potentially once chemotherapy completed and no other major toxicities present. (potentially Friday evening, Saturday morning). Neulasta OBI needed at d/c      JTomi Likens APRN - CNP  Harlene Salts, MD  [facility]  [phone]

## 2017-04-29 LAB — RENAL FUNCTION PANEL
Albumin: 2.2 g/dL — ABNORMAL LOW (ref 3.4–5.0)
Anion Gap: 9 (ref 3–16)
BUN: 45 mg/dL — ABNORMAL HIGH (ref 7–20)
CO2: 22 mmol/L (ref 21–32)
Calcium: 6.6 mg/dL — ABNORMAL LOW (ref 8.3–10.6)
Chloride: 98 mmol/L — ABNORMAL LOW (ref 99–110)
Creatinine: 1 mg/dL (ref 0.9–1.3)
GFR African American: >60 (ref >60)
GFR Non-African American: >60 (ref >60)
Glucose: 166 mg/dL — ABNORMAL HIGH (ref 70–99)
Phosphorus: 4.9 mg/dL (ref 2.5–4.9)
Potassium: 4.2 mmol/L (ref 3.5–5.1)
Sodium: 129 mmol/L — ABNORMAL LOW (ref 136–145)

## 2017-04-29 LAB — CBC WITH AUTO DIFFERENTIAL
Bands Relative: 1 % (ref 0–7)
Basophils %: 0 %
Basophils Absolute: 0 10*3/uL (ref 0.0–0.2)
Eosinophils %: 0 %
Eosinophils Absolute: 0 10*3/uL (ref 0.0–0.6)
Hematocrit: 23.5 % — ABNORMAL LOW (ref 40.5–52.5)
Hemoglobin: 8.1 g/dL — ABNORMAL LOW (ref 13.5–17.5)
Lymphocytes %: 20 %
Lymphocytes Absolute: 0.3 10*3/uL — ABNORMAL LOW (ref 1.0–5.1)
MCH: 29.6 pg (ref 26.0–34.0)
MCHC: 34.4 g/dL (ref 31.0–36.0)
MCV: 86.1 fL (ref 80.0–100.0)
MPV: 7.6 fL (ref 5.0–10.5)
Metamyelocytes Relative: 1 % — AB
Monocytes %: 9 %
Monocytes Absolute: 0.1 10*3/uL (ref 0.0–1.3)
Neutrophils %: 69 %
Neutrophils Absolute: 1.1 10*3/uL — ABNORMAL LOW (ref 1.7–7.7)
Platelets: 32 10*3/uL — ABNORMAL LOW (ref 135–450)
RBC: 2.73 M/uL — ABNORMAL LOW (ref 4.20–5.90)
RDW: 16.1 % — ABNORMAL HIGH (ref 12.4–15.4)
WBC: 1.6 10*3/uL — ABNORMAL LOW (ref 4.0–11.0)

## 2017-04-29 LAB — BASIC METABOLIC PANEL
Anion Gap: 11 (ref 3–16)
BUN: 45 mg/dL — ABNORMAL HIGH (ref 7–20)
CO2: 20 mmol/L — ABNORMAL LOW (ref 21–32)
Calcium: 6.6 mg/dL — ABNORMAL LOW (ref 8.3–10.6)
Chloride: 102 mmol/L (ref 99–110)
Creatinine: 1.1 mg/dL (ref 0.9–1.3)
GFR African American: >60 (ref >60)
GFR Non-African American: >60 (ref >60)
Glucose: 185 mg/dL — ABNORMAL HIGH (ref 70–99)
Potassium: 4.2 mmol/L (ref 3.5–5.1)
Sodium: 133 mmol/L — ABNORMAL LOW (ref 136–145)

## 2017-04-29 LAB — HEPATIC FUNCTION PANEL
ALT: 97 U/L — ABNORMAL HIGH (ref 10–40)
AST: 92 U/L — ABNORMAL HIGH (ref 15–37)
Albumin: 2.6 g/dL — ABNORMAL LOW (ref 3.4–5.0)
Alkaline Phosphatase: 177 U/L — ABNORMAL HIGH (ref 40–129)
Bilirubin, Direct: <0.2 mg/dL (ref 0.0–0.3)
Total Bilirubin: 0.5 mg/dL (ref 0.0–1.0)
Total Protein: 10.6 g/dL — ABNORMAL HIGH (ref 6.4–8.2)

## 2017-04-29 LAB — APTT: aPTT: 30.4 s (ref 26.0–36.0)

## 2017-04-29 LAB — CALCIUM, IONIZED
Calcium, Ionized: 0.92 mmol/L — ABNORMAL LOW (ref 1.12–1.32)
pH, Ven: 7.388 (ref 7.350–7.450)

## 2017-04-29 LAB — PROTEIN ELECTROPHORESIS, URINE
M SPIKE 1 PROTEIN ELECTROPHORESIS URINE/VOL: 0.016 g/dL
Protein, Ur: 0.099 g/dL (ref <0.012)
Protein, Ur: 99 mg/dL — ABNORMAL HIGH (ref <12)

## 2017-04-29 LAB — PHOSPHORUS: Phosphorus: 5.9 mg/dL — ABNORMAL HIGH (ref 2.5–4.9)

## 2017-04-29 LAB — URIC ACID: Uric Acid, Serum: 4.1 mg/dL (ref 3.5–7.2)

## 2017-04-29 LAB — LACTATE DEHYDROGENASE: LD: 689 U/L — ABNORMAL HIGH (ref 100–190)

## 2017-04-29 LAB — CULTURE, VRE: VRE Culture: NEGATIVE

## 2017-04-29 LAB — PATH INTERP ELEC URINE

## 2017-04-29 LAB — MAGNESIUM: Magnesium: 2.4 mg/dL (ref 1.80–2.40)

## 2017-04-29 LAB — FACTOR 7 ACTIVITY: Factor VII Activity: 81 % (ref 80–181)

## 2017-04-29 MED FILL — DEXAMETHASONE 4 MG PO TABS: 4 MG | ORAL | Qty: 10

## 2017-04-29 MED FILL — SENNA 8.6 MG PO TABS: 8.6 MG | ORAL | Qty: 2

## 2017-04-29 MED FILL — ALLOPURINOL 300 MG PO TABS: 300 MG | ORAL | Qty: 1

## 2017-04-29 MED FILL — BUDESONIDE 0.25 MG/2ML IN SUSP: 0.25 MG/2ML | RESPIRATORY_TRACT | Qty: 2

## 2017-04-29 MED FILL — ALBUTEROL SULFATE (2.5 MG/3ML) 0.083% IN NEBU: RESPIRATORY_TRACT | Qty: 3

## 2017-04-29 MED FILL — ONDANSETRON HCL 8 MG PO TABS: 8 MG | ORAL | Qty: 3

## 2017-04-29 MED FILL — VALACYCLOVIR HCL 500 MG PO TABS: 500 MG | ORAL | Qty: 1

## 2017-04-29 MED FILL — ZOLPIDEM TARTRATE 5 MG PO TABS: 5 MG | ORAL | Qty: 1

## 2017-04-29 MED FILL — OXYCODONE HCL 5 MG PO TABS: 5 MG | ORAL | Qty: 1

## 2017-04-29 MED FILL — SODIUM CHLORIDE 0.9 % IV SOLN: 0.9 % | INTRAVENOUS | Qty: 1000

## 2017-04-29 MED FILL — LEVAQUIN 500 MG PO TABS: 500 MG | ORAL | Qty: 1

## 2017-04-29 MED FILL — PROCHLORPERAZINE EDISYLATE 5 MG/ML IJ SOLN: 5 MG/ML | INTRAMUSCULAR | Qty: 2

## 2017-04-29 MED FILL — FLUCONAZOLE 200 MG PO TABS: 200 MG | ORAL | Qty: 1

## 2017-04-29 MED FILL — AMLODIPINE BESYLATE 5 MG PO TABS: 5 MG | ORAL | Qty: 1

## 2017-04-29 MED FILL — FUROSEMIDE 40 MG PO TABS: 40 MG | ORAL | Qty: 1

## 2017-04-29 MED FILL — GABAPENTIN 300 MG PO CAPS: 300 MG | ORAL | Qty: 1

## 2017-04-29 MED FILL — POLYETHYLENE GLYCOL 3350 17 G PO PACK: 17 g | ORAL | Qty: 1

## 2017-04-29 MED FILL — PANTOPRAZOLE SODIUM 40 MG PO TBEC: 40 MG | ORAL | Qty: 1

## 2017-04-29 MED FILL — GUAIFENESIN 100 MG/5ML PO SOLN: 100 MG/5ML | ORAL | Qty: 10

## 2017-04-29 NOTE — Progress Notes (Signed)
David Watkins Progress Note    04/29/2017     David Watkins    MRN: [id number]    DOB: October 10, 1955    Referring MD:  Eulis Canner[place] [address]      SUBJECTIVE:  Cont to be weak with little appetite. Urinating quite a bit d/t lasix & IVFs. BM yesterday.    ECOG PS:  (2) Ambulatory and capable of self care, unable to carry out work activity, up and about > 50% or waking hours    Isolation: None    Medications    Scheduled Meds:  . aluminum hydroxide  640 mg Oral TID WC   . amLODIPine  5 mg Oral Daily   . senna  2 tablet Oral BID   . furosemide  40 mg Oral BID   . fluconazole  200 mg Oral Daily   . levofloxacin  500 mg Oral Nightly   . lidocaine 1 % injection  5 mL Intradermal Once   . sodium chloride flush  10 mL Intravenous 2 times per day   . allopurinol  300 mg Oral Daily   . ondansetron  24 mg Oral Q24H   . pantoprazole  40 mg Oral QAM AC   . dexamethasone  40 mg Oral Daily   . Saline Mouthwash  15 mL Swish & Spit 4x Daily AC & HS   . gabapentin  300 mg Oral TID   . valACYclovir  500 mg Oral BID   . zolpidem  5 mg Oral Nightly   . budesonide  0.25 mg Nebulization BID   . albuterol  2.5 mg Nebulization 4x daily     Continuous Infusions:  . cyclophosphamide/etoposide/CISplatin chemo infusion 41.7 mL/hr at 04/28/17 2114   . sodium chloride 100 mL/hr at 04/29/17 0724     PRN Meds:.sodium chloride flush, guaiFENesin, prochlorperazine **OR** prochlorperazine, alteplase, magnesium hydroxide, Saline Mouthwash, acetaminophen, magnesium sulfate, oxyCODONE **OR** oxyCODONE, LORazepam **OR** LORazepam, polyethylene glycol    ROS   Constitutional: Denies fever, sweats. + wt loss. Overwhelming fatigue & weakness   Eyes: No visual changes or diplopia. No scleral icterus.   ENT: No Headaches, hearing loss or vertigo. No mouth sores or sore throat.   Cardiovascular: No chest pain, +dyspnea on exertion, no palpitations or loss of consciousness.    Respiratory: +dry cough, no wheezing, no sputum  production. No hemoptysis.     Gastrointestinal: +RUQ pain, + appetite loss. +constipation   Genitourinary: No dysuria, trouble voiding, or hematuria.   Musculoskeletal:  +generalized weakness. No joint complaints.   Integumentary: No rash or pruritis.   Neurological: No headache, diplopia. No change in gait, balance, or coordination. No paresthesias.   Endocrine: No temperature intolerance. No excessive thirst, fluid intake, or urination.    Hematologic/Lymphatic: +petechial rash to BLE's & trunk, no evidence of blood clots or swollen lymph nodes.   Allergic/Immunologic: No nasal congestion or hives.       Physical Exam:     I&O:      Intake/Output Summary (Last 24 hours) at 04/29/17 0810  Last data filed at 04/29/17 0647   Gross per 24 hour   Intake             4747 ml   Output             5750 ml   Net            -1003 ml  Vital Signs:  BP 129/84   Pulse 86   Temp 98.6 F (37 C) (Temporal)   Resp 22   Ht 5' 9"  (1.753 m)   Wt 183 lb 9.6 oz (83.3 kg)   SpO2 95%   BMI 27.11 kg/m     Weight:    Wt Readings from Last 3 Encounters:   04/28/17 183 lb 9.6 oz (83.3 kg)   04/05/17 180 lb (81.6 kg)   03/29/17 189 lb 1.6 oz (85.8 kg)       General: Awake, alert and oriented, Anxious  HEENT: normocephalic, PERRL, no scleral erythema or icterus, Oral mucosa with petechiae to soft palate, blood blister to right buccal mucosa  NECK: supple without palpable adenopathy  SKIN: warm dry and intact without lesions rashes or masses. +Scattered petechiae to trunk & BLE's  CHEST: Diminished LS's bilaterally, dry nonproductive cough  CV: Normal S1 S2, RRR, no MRG  ABD: RUQ tenderness to palpation, ND normoactive BS, no palpable masses or hepatosplenomegaly  EXTREMITIES: without edema, denies calf tenderness  NEURO: CN II - XII grossly intact  CATHETER: RUE DL PICC (04/26/17 - IR) - site CDI    Data    CBC:   Recent Labs      04/27/17   0330  04/28/17   0300  04/29/17   0402   WBC  2.4*  2.1*  1.6*   HGB  6.9*  7.5*   8.1*   HCT  19.9*  22.0*  23.5*   MCV  84.8  85.6  86.1   PLT  46*  38*  32*     BMP/Mag:  Recent Labs      04/28/17   0300  04/28/17   1240  04/29/17   0402   NA  133*  132*  133*   K  4.5  4.0  4.2   CL  104  102  102   CO2  20*  20*  20*   PHOS  5.1*  4.9  5.9*   BUN  33*  39*  45*   CREATININE  1.1  1.2  1.1   MG   --    --   2.40     LIVP:   Recent Labs      04/27/17   0330  04/28/17   0300  04/29/17   0402   AST  102*  118*  92*   ALT  71*  96*  97*   BILIDIR  <0.2  <0.2  <0.2   BILITOT  0.6  0.7  0.5   ALKPHOS  181*  195*  177*     Coags:   Recent Labs      04/26/17   0821  04/29/17   0402   PROTIME  com*   --    INR  com*   --    APTT  31.2  30.4     Uric Acid   Recent Labs      04/28/17   0300  04/28/17   1240  04/29/17   0402   LABURIC  3.1*  3.5  4.1     DIAGNOSTIC IMAGING:  1. CT Chest 04/23/17:  1. Redemonstration of multiple lytic lesions throughout the skeleton compatible with patient's history of multiple myeloma.   2. 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 Stable scattered submillimeter pulmonary parenchymal nodules.  4. Marked centrilobular emphysematous changes with upper lobe predominance, stable.   5. Progression of splenomegaly       PATHOLOGY:  1. BM Bx/Asp 04/24/17  Bone marrow biopsy, clot section, aspirate smear, touch imprint,  peripheral blood smear and flow cytometry data:  Plasma cell leukemia.    There is a single continuous sheet of atypical plasma cells  (shown to be kappa monotypic via flow cytometry) effacing the bone  marrow. The cellularity is 100% and there is minimal evidence of  normal-appearing hematopoietic activity. Genetic studies are pending.  The morphologic features are most consistent with the plasmablastic  variant. There is a patchy 3+ increase in reticulin fibers.    2. Myeloma Studies 04/24/17:  SPEP/SIFE: monoclonal IgG kappa by immunofixation (6.65  g/dl)  SFLCs:   Free Kappa Light Chains    637  Free Lambda Light Chain    20.85  Free Kappa/Lambda Ratio   30.55  Immunoglobulins: IgA 106, IgG 6379, IgM 29  B2: 16.5  UPEP/UIFE: Urine IFE shows a polyclonal increase in free kappa and/or   free lambda light chains. No monoclonal free light chains   (Bence Jones Protein) are detected. Urine IFE shows a   monoclonal IgG heavy chain with associated kappa light   chain.   UFLCs:   Free Urinary Kappa Light Chains     150.00 H   mg/dL   Free Urinary Lambda Light Chain      9.06 H   mg/dL    Free Urinary Kappa/Lambda Ratio      16.56 H   ratio     PROBLEM LIST:      IgG Kappa Multiple myeloma  Peripheral neuropathy  Depression  Erectile dysfunction  Hyperlipidemia  Hypertension  Insomnia  Low back pain    TREATMENT:      1. Rad Tx to T5-7, T10-L3, Right Scapula - 3000 cGy - Dr. Jamas Lav 03/20/16-04/01/16  2. RVD x1 03/20/16 - discontinued d/t rash   3. Velcade/Pomalyst/Dex x3 cycles 04/23/16-06/17/16 - discontinued d/t reaction to pomalyst  4. Velcade/Dex x 1 cycle 06/26/16 (last dose of dexamethasone 07/22/16  5. High-dose melphalan followed by administration of PBSCs 2.36 x10^6 cd34cells/kg on 08/28/16  6. Maintenance Revlimid 4m daily (12/2016-04/23/17)  7. Dexamethasone 442mdaily (04/24/17)  8. DCEP (04/26/17)    ASSESSMENT AND PLAN:     1. IgG kappa [facility] myeloma[facility] progressive anorexia, nonproductive cough, 17 pound wt loss, progressiveweakness, DOE, & severe thrombocytopenia all concerning for disease relapse.  - h/o lytic lesions as well as cord compression at T6 and T11  - Initially PR after 5 cycles chemotherapy    Disease Eval: [place] relapse of MM concerning for plasma cell leukemia, bone marrow with single continuous sheet of plasma cells, see above    DCEP Cycle 1, Day +4    2. ID: Afebrile, presenting with severe cough & SOB, pneumonia ruled out on chest CT  - Cont  diflucan, levaquin, & valtrex ppx while neutropenic    Abx Hx:  Merrem 8/24-8/27/18  Azithromycin 8/24-8/27/18    ID W/U:  - Blood Cx 04/23/17 NGTD final  - Procalcitonin elevated at 1.53, Strep Pneumo UrAg neg, Galactomannan negative,  - Fungitel indeterminate (d/t interfering substances in the specimen), CMV pending, EBV PCR neg - low suspicion these will be + given CT findings    3. Heme: Pancytopenia r/t disease relapse & chemotherapy  - Transfuse for Hgb < 7 and Platelets < 10K  -  No transfusion today  - PT/INR unable to be evaluated by convention laboratory methods (presumably from elevated serum protein), therefore reported as grossly elevated. No evidence of clinical DIC or bleeding other than mild petechiae as noted above  - 8/26 - D-dimer 1476  - Factor 7 Assay 04/26/17 pending  - Neulasta OBI at d/c (Saturday morning)    4. Metabolic:Mild HypoNa, +metabolic acidosis, hyperPhos + hypoCa c/w TLS. AKI POA improving. Elevated serum total protein c/w disease relapse, coming down. Wt increased from admit. Decreased uop  - +Laboratory TLS: HyperPhos & HypoCa, uric acid increasing but still WNL. Serum K+ WNL  - HyperPhos: Cont Alternagel 640 mg PO TID, monitor Phos daily  - Cont allopurinol daily  - Cont IVF hydration: NS @ 182m/hr  - Diuresis: Continue Lasix 40 mg po BID.  Give an extra 40 mg po in AM 8/29  - HypoCa: Recheck renal panel & ionized calcium this afternoon (8/30). Would not replace with ongoing TLS & hyperphos. Correction of hyperphos should correct hypoCa    5. Pulmonary: Chronic Bronchitis/COPD. H/o pulmonary nodule.   - Pulm Nodule: Cont to be stable - Cont to Monitor. 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 Also w/emphysematous changes.  - Cont robitussin  cough syrup q4h PRN(Codeine cough syrup w/increased sedation)    6. GI/Nutrition: Appetite and oral intake is poor d/t critical illness. Ongoing constipation  - Cont low microbial diet    - Constipation: Miralax PRN. Scheduled Senna BID. Administered dulcolax x1 dose 8/27- BM 8/29    7. Acute Pain: acute pain to RUQ below ribs as well as to left chest rib pain. +lytic lesions to ribs on CT. RLL pulmonary mass, presumably plasmacytoma  - Previously followedwith pain specialist as outpt but off all narcotics prior to admission  - Oxycodone PRN for acute pain    8. Cardiac: h/o HLD. 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  - Lipitor on hold for now  - Echo 07/13/16 w/abnormal (paradoxical) septal motion is present with preserved LVEF 55-60%.  - Continue norvasc 537mdaily (04/27/17) - HTN improved    9. Anxiety/Depression: Ongoing  - Ativan PRN    10. Insomnia:ongoing, has failed multiple sleep aids  - Cont sonata qHS    11. Peripheral Neuropathy: Ongoing  - Cont Gabapentin 300 mg TID     12. 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  Contraindications to pharmacologic prophylaxis: Thrombocytopenia  Contraindications to mechanical prophylaxis: None      - Disposition: Potentially once chemotherapy completed and no other major toxicities present - Saturday morning. Neulasta OBI needed at d/c      David Watkins - CNP       David AlbertFaDrucilla SchmidtDO, David Watkins

## 2017-04-29 NOTE — Oncology Nurse Navigation (Signed)
Administration: Chemotherapy drug DCEP independently verified with Iona Beard RN prior to administration.  Acknowledgement of informed consent for chemotherapy administration verified.  Original order, appropriateness of regimen, drug supplied, height, weight, BSA, dose calculations, expiration dates/times, drug appearance, and two patient identifiers were verified by both RNs.  Drug checked for vesicant/irritant status and for risk of hypersensitivity.  Most recent laboratory values and allergies, were reviewed.  Positive, brisk blood return via CVC was confirmed prior to administration. Chest x-ray for correct line placement reviewed. Brendolyn Patty and Iona Beard RN verified correct rate of chemotherapy and maintenance IV fluids.  Patient was educated on chemotherapy regimen prior to administration including indication for treatment related to disease & side effects of chemotherapy drug.  Patient verbalizes understanding of all instructions.   Monitoring during infusion done per policy, see Flowsheets.  Blood return verified before, during, and after infusion per policy; no signs of extravasation.  Pt tolerating chemotherapy well and without incident.   Will continue to monitor.

## 2017-04-29 NOTE — Plan of Care (Signed)
Problem: Bleeding:  Goal: Will show no signs and symptoms of excessive bleeding  Will show no signs and symptoms of excessive bleeding   Outcome: Ongoing  Patient's hemoglobin this AM:   Recent Labs      04/29/17   0402   HGB  8.1*     Patient's platelet count this AM:   Recent Labs      04/29/17   0402   PLT  32*    Thrombocytopenia Precautions in place.  Patient showing no signs or symptoms of active bleeding.  Transfusion not indicated at this time.  Patient verbalizes understanding of all instructions. Will continue to assess and implement POC. Call light within reach and hourly rounding in place.     Problem: Falls - Risk of:  Goal: Will remain free from falls  Will remain free from falls    Outcome: Ongoing  Pt meets criteria for orthostasis.  Pt is a High fall risk. See Leamon Arnt Fall Score and ABCDS Injury Risk assessments. Bed alarm and chair alarm in place. Call light in reach.  .     Problem: Pain:  Goal: Pain level will decrease  Pain level will decrease   Outcome: Ongoing  Patient c/o rib pain to right and left rib cage with increased pain when coughing. Pain rated a 6 out of 10 and medication given per order. Pt resting comfortably with call light in reach.    Problem: Nutrition  Goal: Optimal nutrition therapy  Outcome: Ongoing  Pt refused lunch and ate 75% of lunch. Apple juice and water at bedside and pt taking in fluids.     Problem: Infection - Central Venous Catheter-Associated Bloodstream Infection:  Goal: Will show no infection signs and symptoms  Will show no infection signs and symptoms   Outcome: Ongoing  CVC site remains free of signs/symptoms of infection. No drainage, edema, erythema, pain, or warmth noted at site. Dressing changes continue per protocol and on an as needed basis - see flowsheet.     Compliant with [facility] Bath Protocol:  Performed CHG bath today per [facility] protocol utilizing CHG solution in the shower.  CVC site cleansed with CHG wipe over dressing, skin surrounding dressing, and  first 6" of IV tubing.  Pt tolerated well.  Continued to encourage daily CHG bathing per [facility] protocol.    l.    Problem: Emotional/Psychosocial  Goal: Emotional status will stabilize  Emotional status will stabilize     Outcome: Ongoing  Pt wife at bedside most of the day. Frustration noted at times with direction of care. Post shower, pt more cooperative and resting in recliner.     Problem: RESPIRATORY  Goal: Respiratory status will improve  Respiratory status will improve     Outcome: Ongoing  Occasional non-productive cough noted. Pt remains a febrile. No s/s of respiratory distress noted this shift.    Problem: Skin Integrity - Impaired  Goal: Status of oral mucous membranes will improve  Status of oral mucous membranes will improve     Outcome: Ongoing  No mouth pain voiced this shift. Pt oral mucosa intact, pink and moist. No difficulty swallowing. Will continue to monitor.    Problem: Fluid Volume - Imbalance  Goal: Fluid and electrolyte balance are achieved/maintained  Outcome: Ongoing  Pt continues with Alterngel ordered. Labs ordered daily. Will continue to monitor.     Problem: Activity:  Goal: Able to perform physical activity  Encouraged pt to be up OOB and in chair recliner as much  as possible throughout the day and for all meals.  Encouraged frequent short naps as necessary to preserve energy but instructed that while awake, pt should be OOB and in chair.  Discussed risks of prolonged physical inactivity including pneumonia, DVT/PE, and muscle atrophy with pt.  Will continue to encourage with each interaction. Pt out of bed x 2 thus far this shift and is currently in recliner with call light in reach.     Problem: Discharge Planning:  Goal: Discharged to appropriate level of care  Discharged to appropriate level of care   Outcome: Ongoing  Pt verbalizes understanding of discharge plan at this time. Will continue to monitor and provide support.     Problem: PROTECTIVE PRECAUTIONS  Goal: Patient will  remain free of nosocomial Infections  Outcome: Ongoing  Pt afebrile throughout shift.  VSS.  No s/s of infection noted.

## 2017-04-29 NOTE — Oncology Nurse Navigation (Signed)
Pt had episode of nausea with dry heaves, IV Compazine administered per orders, will monitor

## 2017-04-29 NOTE — Care Coordination-Inpatient (Signed)
Late entry for 8/29: SW talked with patient regarding his advanced directives.  He has listed [name] and [name] as his surrogates.  SW also talked with him about his wife being present and patient stated he was fine with this.  A copy of his advanced directives was copied and added back to the paper-lite chart.    Freddi Starr, MSW, [place]

## 2017-04-29 NOTE — Progress Notes (Signed)
Physical Therapy  Facility/Department: [facility]  Daily Treatment Note  NAME: David Watkins  DOB: 08-Mar-1956  MRN: [id number]    Date of Service: 04/29/2017    Discharge Recommendations:    David Watkins scored a 18/24 on the AM-PAC short mobility form. Current research shows that an AM-PAC score of 18 or greater is typically associated with a discharge to the patient's home setting. Based on the patient's AM-PAC score and their current functional mobility deficits, it is recommended that the patient have 2-3 sessions per week of Physical Therapy at d/c to increase the patient's independence.        PT Equipment Recommendations  Other: Continue to assess    Patient Diagnosis(es): There were no encounter diagnoses.     has a past medical history of Bone pain; Hyperlipidemia; Low back pain; Multiple myeloma ([place]); and Neuropathy.   has a past surgical history that includes bone marrow biopsy; other surgical history (Left, 08/17/2016); bone marrow transplant; and pre-malignant / benign skin lesion excision (Left, 03/29/2017).    Restrictions  Position Activity Restriction  Other position/activity restrictions: up as tolerated  Subjective   General  Chart Reviewed: Yes  Additional Pertinent Hx: Pt admitted on 04/23/17 with multiple myeloma relapse.  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  Family / Caregiver Present: Yes  Referring Practitioner: Vicie Mutters APRN  Subjective  Subjective: Patient supine in bed, agreeable to PT with encouragment  Pain Screening  Patient Currently in Pain: Denies  Vital Signs  Patient Currently in Pain: Denies       Orientation  Orientation  Overall Orientation Status: Within Normal Limits  Objective   Bed mobility  Supine to Sit: Contact guard assistance (HOB elevated)  Sit to Supine: Contact guard assistance (HOB elevated)  Comment: Increased time required to complete task  Transfers  Sit to Stand: Contact guard  assistance (from bed)  Stand to sit: Contact guard assistance (to bed)  Comment: Slow to rise from bed, patient declining to sit in chair  Ambulation  Ambulation?: Yes  Ambulation 1  Surface: level tile  Device: No Device (IV pole)  Assistance: Contact guard assistance  Quality of Gait: Wide BOS, decreased cadence, short step length BLE, reaching out for handrail at times, mildly unsteady, slow cadence  Distance: 250'  Comments: Patient required 2 standing rest breaks. Declining continued ambulation and requesting to return to bed to sleep.   Stairs/Curb  Stairs?: No     Balance  Sitting - Static: Good  Sitting - Dynamic: Fair  Standing - Static: Fair  Standing - Dynamic: Fair        Assessment   Body structures, Functions, Activity limitations: Decreased functional mobility   Assessment: Patient continues to present below his baseline, demonstrates mild unsteady gait with patient reaching out for handrails on wall for support. Patient limited by fatigue. Would benefit from continued skilled therapy to return to PLOF.  Will continue to assess and follow inpatient.   Treatment Diagnosis: Decreased functional mobility related to multiple myeloma relapse  Patient Education: Role of PT and safety with mobility- pt verbalized understanding  REQUIRES PT FOLLOW UP: Yes  Activity Tolerance  Activity Tolerance: Patient limited by fatigue     AM-PAC Score  AM-PAC Inpatient Mobility Raw Score : 18  AM-PAC Inpatient T-Scale Score : 43.63  Mobility Inpatient CMS 0-100% Score: 46.58  Mobility Inpatient CMS G-Code Modifier : CK  Goals  Short term goals  Time Frame for Short term goals: by discharge  Short term goal 1: Sit to stand with supervision ONGOING  Short term goal 2: Pt will ambulate 400 feet with supervision ONGOING  Short term goal 3: Pt will be independent with LE HEP x 10 ONGOING  Short term goal 4: Pt will ambulate up and down 1 flight steps with rail and SBA ONGOING    Plan    Plan  Times per week:  2-5  Current Treatment Recommendations: Strengthening, Hotel manager, Therapist, nutritional, Personnel officer, Stair training, Training and development officer, Proofreader, Management consultant Devices  Type of devices: Call light within reach, Nurse notified, Left in bed, Bed alarm in place     Therapy Time   Individual Concurrent Group Co-treatment   Time In 0912         Time Out 0936         Minutes 24               Timed Code Treatment Minutes:   24    Total Treatment Minutes:  24    This note will serve as a discharge summary if patient is discharged prior to next treatment session.  Sheila Oats PT, DPT, [phone]

## 2017-04-29 NOTE — Plan of Care (Signed)
Problem: Falls - Risk of:  Goal: Will remain free from falls  Will remain free from falls    Outcome: Ongoing  Pt on BEA with standby asst, pt calls out appropriately    Problem: Nutrition  Goal: Optimal nutrition therapy  Outcome: Ongoing  Pt encouraged to eat small frequent meals as tolerated, will monitor    Problem: Infection - Central Venous Catheter-Associated Bloodstream Infection:  Goal: Will show no infection signs and symptoms  Will show no infection signs and symptoms   Outcome: Ongoing  Right PICC site remains free of signs/symptoms of infection. No drainage, edema, erythema, pain, or warmth noted at site. Dressing changes continue per protocol and on an as needed basis - see flowsheet.           Problem: Emotional/Psychosocial  Goal: Emotional status will stabilize  Emotional status will stabilize     Outcome: Ongoing  Pt worrisome and tearful about relapse    Problem: Discharge Planning:  Goal: Discharged to appropriate level of care  Discharged to appropriate level of care   Outcome: Ongoing  When appropriate    Problem: PROTECTIVE PRECAUTIONS  Goal: Patient will remain free of nosocomial Infections  Outcome: Ongoing  Pt showing no S/S of infection; Protective precautions followed this shift

## 2017-04-29 NOTE — Plan of Care (Signed)
Problem: Bleeding:  Goal: Will show no signs and symptoms of excessive bleeding  Will show no signs and symptoms of excessive bleeding   Outcome: Ongoing  Patient's hemoglobin this AM:   Recent Labs      04/29/17   0402   HGB  8.1*     Patient's platelet count this AM:   Recent Labs      04/29/17   0402   PLT  32*    Thrombocytopenia Precautions in place.  Patient showing no signs or symptoms of active bleeding.  Transfusion not indicated at this time.  Patient verbalizes understanding of all instructions. Will continue to assess and implement POC. Call light within reach and hourly rounding in place.

## 2017-04-29 NOTE — Oncology Nurse Navigation (Signed)
Administration: Chemotherapy drug DCEP independently verified with Harriet Masson RN prior to administration.  Acknowledgement of informed consent for chemotherapy administration verified.  Original order, appropriateness of regimen, drug supplied, height, weight, BSA, dose calculations, expiration dates/times, drug appearance, and two patient identifiers were verified by both RNs.  Drug checked for vesicant/irritant status and for risk of hypersensitivity.  Most recent laboratory values and allergies, were reviewed.  Positive, brisk blood return via CVC was confirmed prior to administration. Chest x-ray for correct line placement reviewed. David Watkins and David Risenbeck RN verified correct rate of chemotherapy and maintenance IV fluids.  Patient was educated on chemotherapy regimen prior to administration including indication for treatment related to disease & side effects of chemotherapy drug.  Patient verbalizes understanding of all instructions.    Monitoring during infusion done per policy, see Flowsheets.  Blood return verified before, during, and after infusion per policy; no signs of extravasation.  Pt tolerating chemotherapy well and without incident.   Will continue to monitor.

## 2017-04-30 LAB — CMV BY PCR QUANTITATIVE
CMV DNA Quant: <227 IU/mL
CMV DNA,Qnt Interp: <2.4 log IU/mL
CMV DNA,Qnt Interp: NOT DETECTED
CMV DNA,Quant PCR: <2.6 log cpy/mL
CMVQ copy/ml: <390 {copies}/mL

## 2017-04-30 LAB — CBC WITH AUTO DIFFERENTIAL
Basophils %: 0 %
Basophils Absolute: 0 10*3/uL (ref 0.0–0.2)
Eosinophils %: 0 %
Eosinophils Absolute: 0 10*3/uL (ref 0.0–0.6)
Hematocrit: 22.3 % — ABNORMAL LOW (ref 40.5–52.5)
Hemoglobin: 7.7 g/dL — ABNORMAL LOW (ref 13.5–17.5)
Lymphocytes %: 14 %
Lymphocytes Absolute: 0.1 10*3/uL — ABNORMAL LOW (ref 1.0–5.1)
MCH: 29.5 pg (ref 26.0–34.0)
MCHC: 34.6 g/dL (ref 31.0–36.0)
MCV: 85.3 fL (ref 80.0–100.0)
MPV: 7.5 fL (ref 5.0–10.5)
Monocytes %: 4 %
Monocytes Absolute: 0 10*3/uL (ref 0.0–1.3)
Neutrophils %: 82 %
Neutrophils Absolute: 0.7 10*3/uL — CL (ref 1.7–7.7)
Platelets: 19 10*3/uL — CL (ref 135–450)
RBC: 2.61 M/uL — ABNORMAL LOW (ref 4.20–5.90)
RDW: 16.1 % — ABNORMAL HIGH (ref 12.4–15.4)
WBC: 0.8 10*3/uL — ABNORMAL LOW (ref 4.0–11.0)

## 2017-04-30 LAB — HEPATIC FUNCTION PANEL
ALT: 96 U/L — ABNORMAL HIGH (ref 10–40)
AST: 83 U/L — ABNORMAL HIGH (ref 15–37)
Albumin: 2.2 g/dL — ABNORMAL LOW (ref 3.4–5.0)
Alkaline Phosphatase: 157 U/L — ABNORMAL HIGH (ref 40–129)
Bilirubin, Direct: <0.2 mg/dL (ref 0.0–0.3)
Total Bilirubin: 0.6 mg/dL (ref 0.0–1.0)
Total Protein: 9.5 g/dL — ABNORMAL HIGH (ref 6.4–8.2)

## 2017-04-30 LAB — BASIC METABOLIC PANEL
Anion Gap: 11 (ref 3–16)
BUN: 43 mg/dL — ABNORMAL HIGH (ref 7–20)
CO2: 21 mmol/L (ref 21–32)
Calcium: 6.3 mg/dL — ABNORMAL LOW (ref 8.3–10.6)
Chloride: 99 mmol/L (ref 99–110)
Creatinine: 0.9 mg/dL (ref 0.9–1.3)
GFR African American: >60 (ref >60)
GFR Non-African American: >60 (ref >60)
Glucose: 166 mg/dL — ABNORMAL HIGH (ref 70–99)
Potassium: 4.5 mmol/L (ref 3.5–5.1)
Sodium: 131 mmol/L — ABNORMAL LOW (ref 136–145)

## 2017-04-30 LAB — URIC ACID: Uric Acid, Serum: 3.7 mg/dL (ref 3.5–7.2)

## 2017-04-30 LAB — PHOSPHORUS: Phosphorus: 4.6 mg/dL (ref 2.5–4.9)

## 2017-04-30 LAB — LACTATE DEHYDROGENASE: LD: 540 U/L — ABNORMAL HIGH (ref 100–190)

## 2017-04-30 MED ORDER — SENNOSIDES 8.6 MG PO TABS
8.6 MG | Freq: Every evening | ORAL | Status: DC
Start: 2017-04-30 — End: 2017-05-01

## 2017-04-30 MED FILL — OXYCODONE HCL 5 MG PO TABS: 5 MG | ORAL | Qty: 1

## 2017-04-30 MED FILL — GUAIFENESIN 100 MG/5ML PO SOLN: 100 MG/5ML | ORAL | Qty: 10

## 2017-04-30 MED FILL — ALLOPURINOL 300 MG PO TABS: 300 MG | ORAL | Qty: 1

## 2017-04-30 MED FILL — FUROSEMIDE 40 MG PO TABS: 40 MG | ORAL | Qty: 1

## 2017-04-30 MED FILL — SENNA 8.6 MG PO TABS: 8.6 MG | ORAL | Qty: 2

## 2017-04-30 MED FILL — AMLODIPINE BESYLATE 5 MG PO TABS: 5 MG | ORAL | Qty: 1

## 2017-04-30 MED FILL — ALBUTEROL SULFATE (2.5 MG/3ML) 0.083% IN NEBU: RESPIRATORY_TRACT | Qty: 3

## 2017-04-30 MED FILL — GABAPENTIN 300 MG PO CAPS: 300 MG | ORAL | Qty: 1

## 2017-04-30 MED FILL — VALACYCLOVIR HCL 500 MG PO TABS: 500 MG | ORAL | Qty: 1

## 2017-04-30 MED FILL — PANTOPRAZOLE SODIUM 40 MG PO TBEC: 40 MG | ORAL | Qty: 1

## 2017-04-30 MED FILL — SODIUM CHLORIDE 0.9 % IV SOLN: 0.9 % | INTRAVENOUS | Qty: 1000

## 2017-04-30 MED FILL — BUDESONIDE 0.25 MG/2ML IN SUSP: 0.25 MG/2ML | RESPIRATORY_TRACT | Qty: 2

## 2017-04-30 MED FILL — ONDANSETRON HCL 8 MG PO TABS: 8 MG | ORAL | Qty: 3

## 2017-04-30 MED FILL — ZOLPIDEM TARTRATE 5 MG PO TABS: 5 MG | ORAL | Qty: 1

## 2017-04-30 MED FILL — ALUMINUM HYDROXIDE GEL 320 MG/5ML PO SUSP: 320 MG/5ML | ORAL | Qty: 120

## 2017-04-30 MED FILL — OXYCODONE HCL 5 MG PO TABS: 5 MG | ORAL | Qty: 2

## 2017-04-30 MED FILL — FLUCONAZOLE 200 MG PO TABS: 200 MG | ORAL | Qty: 1

## 2017-04-30 MED FILL — LEVAQUIN 500 MG PO TABS: 500 MG | ORAL | Qty: 1

## 2017-04-30 NOTE — Other (Signed)
04/30/17 1505   Encounter Summary   Services provided to: Patient and family together   Volunteer Visit (8/31 fr dale)   Routine   Intervention Prayer;Blessing

## 2017-04-30 NOTE — Plan of Care (Signed)
Problem: Bleeding:  Goal: Will show no signs and symptoms of excessive bleeding  Will show no signs and symptoms of excessive bleeding   Outcome: Ongoing  Patient's hemoglobin this AM:   Recent Labs      04/30/17   0352   HGB  7.7*     Patient's platelet count this AM:   Recent Labs      04/30/17   0352   PLT  19*    Thrombocytopenia Precautions in place.  Patient showing no signs or symptoms of active bleeding.  Transfusion not indicated at this time.  Patient verbalizes understanding of all instructions. Will continue to assess and implement POC. Call light within reach and hourly rounding in place.

## 2017-04-30 NOTE — Care Coordination-Inpatient (Signed)
[facility]    Patient aware and agreeable to services. Faxed orders to [facility].    Kiondre Grenz, LPN  Care Transition Nurse  [facility]  [phone]

## 2017-04-30 NOTE — Behavioral Health Treatment Team (Signed)
Psychologist attended clinical rounds with team and remained after to speak with pt. Pt says he has been angry since relapse but also now finds he is extremely worried.  He says he is not sure what is next in his treatment but wants Dr. Hunt Oris to meet with him next week and let him know.  Provided emotional support and encouragement and will continue to see pt for routine visits through hospital stay.    Katie Faraone, Psy.D., ABPP

## 2017-04-30 NOTE — Progress Notes (Signed)
David Watkins    04/30/2017     David Watkins    MRN: [id number]    DOB: 1955-10-06    Referring MD:  Eulis [place]anner, [place] [address]      SUBJECTIVE:  In bed; active earlier this AM - resting; some concerns from PT regarding weakness, esp with steps    ECOG PS:  (2) Ambulatory and capable of self care, unable to carry out work activity, up and about > 50% or waking hours    Isolation: None    Medications    Scheduled Meds:  . aluminum hydroxide  640 mg Oral TID WC   . amLODIPine  5 mg Oral Daily   . senna  2 tablet Oral BID   . furosemide  40 mg Oral BID   . fluconazole  200 mg Oral Daily   . levofloxacin  500 mg Oral Nightly   . lidocaine 1 % injection  5 mL Intradermal Once   . sodium chloride flush  10 mL Intravenous 2 times per day   . allopurinol  300 mg Oral Daily   . ondansetron  24 mg Oral Q24H   . pantoprazole  40 mg Oral QAM AC   . Saline Mouthwash  15 mL Swish & Spit 4x Daily AC & HS   . gabapentin  300 mg Oral TID   . valACYclovir  500 mg Oral BID   . zolpidem  5 mg Oral Nightly   . budesonide  0.25 mg Nebulization BID   . albuterol  2.5 mg Nebulization 4x daily     Continuous Infusions:  . cyclophosphamide/etoposide/CISplatin chemo infusion 41.7 mL/hr at 04/29/17 2153   . sodium chloride 100 mL/hr at 04/30/17 0806     PRN Meds:.sodium chloride flush, guaiFENesin, prochlorperazine **OR** prochlorperazine, alteplase, magnesium hydroxide, Saline Mouthwash, acetaminophen, magnesium sulfate, oxyCODONE **OR** oxyCODONE, LORazepam **OR** LORazepam, polyethylene glycol    ROS   Constitutional: Denies fever, sweats. + wt loss. Overwhelming fatigue & weakness   Eyes: No visual changes or diplopia. No scleral icterus.   ENT: No Headaches, hearing loss or vertigo. No mouth sores or sore throat.   Cardiovascular: No chest pain, +dyspnea on exertion, no palpitations or loss of consciousness.    Respiratory: +dry cough, no wheezing, no sputum production. No hemoptysis.      Gastrointestinal: +RUQ pain, + appetite loss. +constipation   Genitourinary: No dysuria, trouble voiding, or hematuria.   Musculoskeletal:  +generalized weakness. No joint complaints.   Integumentary: No rash or pruritis.   Neurological: No headache, diplopia. No change in gait, balance, or coordination. No paresthesias.   Endocrine: No temperature intolerance. No excessive thirst, fluid intake, or urination.    Hematologic/Lymphatic: +petechial rash to BLE's & trunk, no evidence of blood clots or swollen lymph nodes.   Allergic/Immunologic: No nasal congestion or hives.       Physical Exam:     I&O:      Intake/Output Summary (Last 24 hours) at 04/30/17 0838  Last data filed at 04/30/17 0816   Gross per 24 hour   Intake             4064 ml   Output             4540 ml   Net             -476 ml       Vital Signs:  BP 124/82   Pulse  72   Temp 97.6 F (36.4 C) (Oral)   Resp 17   Ht 5' 9"  (1.753 m)   Wt 187 lb 3.2 oz (84.9 kg)   SpO2 96%   BMI 27.64 kg/m     Weight:    Wt Readings from Last 3 Encounters:   04/29/17 187 lb 3.2 oz (84.9 kg)   04/05/17 180 lb (81.6 kg)   03/29/17 189 lb 1.6 oz (85.8 kg)       General: Awake, alert and oriented, Anxious  HEENT: normocephalic, PERRL, no scleral erythema or icterus, Oral mucosa with petechiae to soft palate, blood blister to right buccal mucosa  NECK: supple without palpable adenopathy  SKIN: warm dry and intact without lesions rashes or masses. +Scattered petechiae to trunk & BLE's  CHEST: Diminished LS's bilaterally, dry nonproductive cough  CV: Normal S1 S2, RRR, no MRG  ABD: RUQ tenderness to palpation, ND normoactive BS, no palpable masses or hepatosplenomegaly  EXTREMITIES: without edema, denies calf tenderness  NEURO: CN II - XII grossly intact  CATHETER: RUE DL PICC (04/26/17 - IR) - site CDI    Data    CBC:   Recent Labs      04/28/17   0300  04/29/17   0402  04/30/17   0352   WBC  2.1*  1.6*  0.8*   HGB  7.5*  8.1*  7.7*   HCT  22.0*  23.5*   22.3*   MCV  85.6  86.1  85.3   PLT  38*  32*  19*     BMP/Mag:  Recent Labs      04/29/17   0402  04/29/17   1510  04/30/17   0352   NA  133*  129*  131*   K  4.2  4.2  4.5   CL  102  98*  99   CO2  20*  22  21   PHOS  5.9*  4.9  4.6   BUN  45*  45*  43*   CREATININE  1.1  1.0  0.9   MG  2.40   --    --      LIVP:   Recent Labs      04/28/17   0300  04/29/17   0402  04/30/17   0352   AST  118*  92*  83*   ALT  96*  97*  96*   BILIDIR  <0.2  <0.2  <0.2   BILITOT  0.7  0.5  0.6   ALKPHOS  195*  177*  157*     Coags:   Recent Labs      04/29/17   0402   APTT  30.4     Uric Acid   Recent Labs      04/28/17   1240  04/29/17   0402  04/30/17   0352   LABURIC  3.5  4.1  3.7     DIAGNOSTIC IMAGING:  1. CT Chest 04/23/17:  1. Redemonstration of multiple lytic lesions throughout the skeleton compatible with patient's history of multiple myeloma.   2. 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 Stable scattered submillimeter pulmonary parenchymal nodules.   4. Marked centrilobular emphysematous changes with upper lobe predominance, stable.   5. Progression of splenomegaly       PATHOLOGY:  1. BM Bx/Asp 04/24/17  Bone marrow  biopsy, clot section, aspirate smear, touch imprint,  peripheral blood smear and flow cytometry data:  Plasma cell leukemia.    There is a single continuous sheet of atypical plasma cells  (shown to be kappa monotypic via flow cytometry) effacing the bone  marrow. The cellularity is 100% and there is minimal evidence of  normal-appearing hematopoietic activity. Genetic studies are pending.  The morphologic features are most consistent with the plasmablastic  variant. There is a patchy 3+ increase in reticulin fibers.    2. Myeloma Studies 04/24/17:  SPEP/SIFE: monoclonal IgG kappa by immunofixation (6.65 g/dl)  SFLCs:   Free Kappa Light Chains    637  Free Lambda Light Chain    20.85  Free  Kappa/Lambda Ratio   30.55  Immunoglobulins: IgA 106, IgG 6379, IgM 29  B2: 16.5  UPEP/UIFE: Urine IFE shows a polyclonal increase in free kappa and/or   free lambda light chains. No monoclonal free light chains   (Bence Jones Protein) are detected. Urine IFE shows a   monoclonal IgG heavy chain with associated kappa light   chain.   UFLCs:   Free Urinary Kappa Light Chains     150.00 H   mg/dL   Free Urinary Lambda Light Chain      9.06 H   mg/dL    Free Urinary Kappa/Lambda Ratio      16.56 H   ratio     PROBLEM LIST:      IgG Kappa Multiple myeloma  Peripheral neuropathy  Depression  Erectile dysfunction  Hyperlipidemia  Hypertension  Insomnia  Low back pain    TREATMENT:      1. Rad Tx to T5-7, T10-L3, Right Scapula - 3000 cGy - Dr. Jamas Lav 03/20/16-04/01/16  2. RVD x1 03/20/16 - discontinued d/t rash   3. Velcade/Pomalyst/Dex x3 cycles 04/23/16-06/17/16 - discontinued d/t reaction to pomalyst  4. Velcade/Dex x 1 cycle 06/26/16 (last dose of dexamethasone 07/22/16  5. High-dose melphalan followed by administration of PBSCs 2.36 x10^6 cd34cells/kg on 08/28/16  6. Maintenance Revlimid 64m daily (12/2016-04/23/17)  7. Dexamethasone 452mdaily (04/24/17)  8. DCEP (04/26/17)    ASSESSMENT AND PLAN:     1. IgG kappa multiple myeloma: [facility] progressive anorexia, nonproductive cough, 17 pound wt loss, progressiveweakness, DOE, & severe thrombocytopenia all concerning for disease relapse.  - h/o lytic lesions as well as cord compression at T6 and T11  - Initially PR after 5 cycles chemotherapy    Disease Eval: [place] relapse of MM concerning for plasma cell leukemia, bone marrow with single continuous sheet of plasma cells, see above    DCEP Cycle 1, Day +5    2. ID: Afebrile, presenting with severe cough & SOB, pneumonia ruled out on chest CT  - Cont diflucan, levaquin, & valtrex ppx while neutropenic    Abx Hx:  Merrem 8/24-8/27/18  Azithromycin  8/24-8/27/18    ID W/U:  - Blood Cx 04/23/17 NGTD final  - Procalcitonin elevated at 1.53, Strep Pneumo UrAg neg, Galactomannan negative,  - Fungitel indeterminate (d/t interfering substances in the specimen), CMV neg, EBV PCR neg - low suspicion these will be + given CT findings    3. Heme: Pancytopenia r/t disease relapse & chemotherapy  - Transfuse for Hgb < 7 and Platelets < 10K  - No transfusion today  - PT/INR unable to be evaluated by convention laboratory methods (presumably from elevated serum protein), therefore reported as grossly elevated. No evidence of clinical DIC or  bleeding other than mild petechiae as noted above  - 8/26 - D-dimer 1476  - Factor 7 Assay 04/26/17 WNL  - Neulasta OBI at d/c (Saturday morning)    4. Metabolic: HypoNa, hyperPhos improving + ongoing hypoCa c/w TLS. AKI POA resolved. Elevated serum total protein c/w disease relapse, coming down. Wt increased from admit. Excellent uop now  - +Laboratory TLS: HyperPhos (improved), ongoing HypoCa  - HyperPhos: Cont Alternagel 640 mg PO TID, monitor Phos daily  - Cont allopurinol daily  - HypoCa: Would not replace with ongoing TLS & hyperphos. Correction of hyperphos should correct hypoCa  - Cont IVF hydration: NS @ 139m/hr  - Diuresis: Continue Lasix 40 mg po BID.  Give an extra 40 mg po in AM 8/29    5. Pulmonary: Chronic Bronchitis/COPD. H/o pulmonary nodule.   - Pulm Nodule: Cont to be stable - Cont to Monitor. 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 Also w/emphysematous changes.  - Cont robitussin cough syrup q4h PRN(Codeine cough syrup w/increased sedation)    6. GI/Nutrition: Appetite and oral intake is poor d/t critical illness.  - Cont low microbial diet    - Constipation: Resolved, Cont Miralax PRN. Scheduled Senna  daily.    7. Acute Pain: acute pain to RUQ below ribs as well as to left chest rib pain. +lytic lesions to ribs on CT. RLL pulmonary mass, presumably plasmacytoma  - Previously followedwith pain specialist as outpt but off all narcotics prior to admission  - Oxycodone PRN for acute pain    8. Cardiac: h/o HLD. 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  - Lipitor on hold for now  - Echo 07/13/16 w/abnormal (paradoxical) septal motion is present with preserved LVEF 55-60%.  - Continue norvasc 544mdaily (04/27/17) - HTN improved    9. Anxiety/Depression: Ongoing  - Ativan PRN    10. Insomnia:ongoing, has failed multiple sleep aids  - Cont sonata qHS    11. Peripheral Neuropathy: Ongoing  - Cont Gabapentin 300 mg TID     12. Bone Health: H/o spinal cord compression & diffuse lytic lesions t/o skeleton  - CT Chest 04/23/17 w/multiple lytic lesions throughout the skeleton   - Resume Ca/Vit D & zometa once outpt and disease & renal fxn stable    13. Critical Illness Myopathy: r/t disease relapse  - Cont PT/OT  - SW following - can d/c home with 24hr assist. Will need PT/OT at home.      - DVT Prophylaxis: Platelets <50,000 cells/dL - prophylactic lovenox on hold and mechanical prophylaxis with bilateral SCDs while in bed in place.  Contraindications to pharmacologic prophylaxis: Thrombocytopenia  Contraindications to mechanical prophylaxis: None      - Disposition: Potentially once chemotherapy completed and no other major toxicities present Saturday morning. Neulasta OBI needed at d/c; if discharge delayed, then will need to begin daily GCSF      BrLoma NewtonAPRN - CNP       EdMerlyn AlbertFaDrucilla SchmidtDO, MS

## 2017-04-30 NOTE — Plan of Care (Signed)
Problem: Nutrition  Goal: Optimal nutrition therapy  Outcome: Ongoing  Nutrition Problem: Inadequate oral intake  Intervention: Food and/or Nutrient Delivery: Continue current diet, Continue current ONS  Nutritional Goals: Pt will consume and tolerate at least 75% of meals/supplements offered

## 2017-04-30 NOTE — Progress Notes (Signed)
Occupational Therapy  Facility/Department: [facility]  Daily Treatment Note  NAME: David Watkins  DOB: [birth year]  MRN: [id number]    Date of Service: 04/30/2017    Discharge Recommendations: SON BARKAN scored a 22/24 on the AM-PAC ADL Inpatient form. Current research shows that an AM-PAC score of 18 or greater is typically associated with a discharge to the patient's home setting. Based on the patient's AM-PAC score and their current ADL deficits, it is recommended that the patient have 2-3 sessions per week of Occupational Therapy at d/c to increase the patient's independence.    HOME HEALTH CARE: LEVEL 1 STANDARD    - Initial home health evaluation to occur within 24-48 hours, in patient home   - Therapy to evaluate with goal of regaining prior level of functioning   - Therapy to evaluate if patient has [place] needs for personal care       OT Equipment Recommendations  Equipment Needed: No    Patient Diagnosis(es): There were no encounter diagnoses.      has a past medical history of Bone pain; Hyperlipidemia; Low back pain; Multiple myeloma ([place]); and Neuropathy.   has a past surgical history that includes bone marrow biopsy; other surgical history (Left, 08/17/2016); bone marrow transplant; and pre-malignant / benign skin lesion excision (Left, 03/29/2017).    Restrictions  Position Activity Restriction  Other position/activity restrictions: up as tolerated  Subjective   General  Chart Reviewed: Yes  Additional Pertinent Hx: PMH:  multiple myeloma, hyperlipidemia, neuropathy, bone marrow transplant, low back pain  Family / Caregiver Present: No  Diagnosis: Pt admitted with persistent cough, possible relapse of multiple myeloma, receiving chemotherapy-CXR=neg, chest CT=multiple lytic lesion, increased size soft tissue lesion R LL lung, progression of splemomegaly  Subjective  Subjective: Pt sitting up in chair upon entry; agreeable to OT. "Just tell me what I need to do." Overall  pleasant and cooperative during session. "I'm scared."       Orientation  Orientation  Overall Orientation Status: Within Normal Limits     Objective   Treatment included functional transfer training, ADLs, and patient education.      Balance  Sitting Balance: Independent (in chair)  Standing Balance: Stand by assistance  Standing Balance  Sit to stand: Contact guard assistance  Stand to sit: Contact guard assistance  Comment: Pt completed 3 stairs w/ PT and OT present- see PT note for additional gait details.   Functional Mobility  Functional - Mobility Device: No device  Assist Level: Stand by assistance  Functional Mobility Comments: Occasional standing rest breaks, but takes appropriately. Able to hold conversation throughout. Places hands on railings during breaks  Ecologist - Technique: Ambulating  Equipment Used: Manufacturing systems engineer: Stand by assistance     Transfers  Sit to stand: Contact guard assistance  Stand to sit: Contact guard assistance            Cognition  Overall Cognitive Status: WFL  Cognition Comment: Improved mood/demeanor from previous session              Assessment   Performance deficits / Impairments: Decreased functional mobility ;Decreased ADL status;Decreased ROM    Assessment: Pt demonstrates improved activity tolerance and cooperation from previous session. Further mobility (including steps) limited d/t being hooked up to IV for chemo. Extensively educated pt on safe and proper technique for completing steps for safe entry into house w/ PT present. Began educating pt  on possible setup on bottom floor of house (has 18 steps to 2nd level in house), but then he reported he plans to d/c to significant other's ranch style home. Emphasized importance of having someone with him, at least initially, to ensure safe transition home. Message relayed to SW regarding d/c dispo. Will continue per poc.     Treatment Diagnosis: impaired transfers secondary to multiple  myeloma  Patient Education: D/c planning- pt verb understanding  REQUIRES OT FOLLOW UP: Yes  Activity Tolerance  Activity Tolerance: Patient Tolerated treatment well  Safety Devices  Safety Devices in place: Yes  Type of devices: Left in chair;Nurse notified;Call light within reach;Chair alarm in place          Plan   Plan  Times per week: 2-5  Times per day: Daily  Current Treatment Recommendations: Hotel manager, Therapist, nutritional, Endurance Training, Self-Care / ADL, ROM  If patient discharges prior to next treatment, this note will serve as discharge summary. Will continue per POC if patient does not discharge.    AM-PAC Score        AM-PAC Inpatient Daily Activity Raw Score: 22  AM-PAC Inpatient ADL T-Scale Score : 47.1  ADL Inpatient CMS 0-100% Score: 25.8  ADL Inpatient CMS G-Code Modifier : CJ    Goals  Short term goals  Time Frame for Short term goals: discharge  Short term goal 1: pt to participate in commode transfer assessment - goal met, d/c goal  Short term goal 2: pt to stand for 6 minutes with SBA while doing functional tasks and UE exerc - not met  Short term goal 3: pt to do functional mobility in room with SBA - goal met  Short term goal 4: pt to tolerate 12 reps B UE AROM exerc to increase ADL indep - not met  Patient Goals   Patient goals : to be indep showering       Therapy Time   Individual Concurrent Group Co-treatment   Time In 1330         Time Out 1410         Minutes 40         Timed Code Treatment Minutes: [place], OT

## 2017-04-30 NOTE — Care Coordination-Inpatient (Signed)
SW talked with patient regarding discharge planning.  Patient will be going to his SO home verses his home so he will not have steps to the bedroom and bathroom.  SW notified [facility] of change of discharge location.      Freddi Starr, MSW, [place]

## 2017-04-30 NOTE — Progress Notes (Signed)
Physical Therapy  Facility/Department: [facility]  Daily Treatment Note  NAME: David Watkins  DOB: [birth year]  MRN: [id number]    Date of Service: 04/30/2017    Discharge Recommendations:    MILLION MAHARAJ scored a 18/24 on the AM-PAC short mobility form. Current research shows that an AM-PAC score of 18 or greater is typically associated with a discharge to the patient's home setting. Based on the patient's AM-PAC score and their current functional mobility deficits, it is recommended that the patient have 2-3 sessions per week of Physical Therapy at d/c to increase the patient's independence.        PT Equipment Recommendations  Other: Continue to assess    Patient Diagnosis(es): There were no encounter diagnoses.     has a past medical history of Bone pain; Hyperlipidemia; Low back pain; Multiple myeloma ([place]); and Neuropathy.   has a past surgical history that includes bone marrow biopsy; other surgical history (Left, 08/17/2016); bone marrow transplant; and pre-malignant / benign skin lesion excision (Left, 03/29/2017).    Restrictions  Position Activity Restriction  Other position/activity restrictions: up as tolerated  Subjective   General  Chart Reviewed: Yes  Additional Pertinent Hx: Pt admitted on 04/23/17 with multiple myeloma relapse.  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  Family / Caregiver Present: No  Referring Practitioner: Vicie Mutters APRN  Subjective  Subjective: Patient sitting up in chair, agreeable to PT  Pain Screening  Patient Currently in Pain: Yes (not rated, RN aware)  Vital Signs  Patient Currently in Pain: Yes (not rated, RN aware)       Orientation  Orientation  Overall Orientation Status: Within Functional Limits  Objective      Transfers  Sit to Stand: Contact guard assistance (from chair)  Stand to sit: Contact guard assistance (to chair)  Ambulation  Ambulation?: Yes  Ambulation 1  Surface: level tile  Device: No  Device  Assistance: Stand by assistance  Quality of Gait: Decreased cadence, no LOB, patient stops multiple times to rest with hand on wall handrails.   Distance: 350'  Comments: Patient required 5 short standing rest breaks. Demonstrating steady gait, no LOB.    Stairs/Curb  Stairs?: Yes  Stairs  # Steps : 3  Rails: Bilateral  Device: No Device  Assistance: Contact guard assistance  Comment: Slow pace, no LOB, patient reporting a cramp in RLE. Unable to attempt more steps. Patient educated on safety with stair navigation and need for assistance on steps. Patient verbalized understanding.     Balance  Sitting - Static: Good  Sitting - Dynamic: Good  Standing - Static: Good  Standing - Dynamic: Fair                           Assessment   Body structures, Functions, Activity limitations: Decreased functional mobility   Assessment: Patient continues to present below baseline, demonstrating improved gait and activity tolerance this date. Patient reports he plans to stay at a "friends house" which is a [place] where patient would not have to navigate any steps. Feel patient would benefit from 24 hour supervision and assistance for safety, patient reports he can not get this. Patient reports being safe and prepared to return home(to friends house) and denies any questions concerns or needs. Will continue to follow inpatient.   Treatment Diagnosis: Decreased functional mobility related to multiple  myeloma relapse  Patient Education: Role of PT and safety with mobility- pt verbalized understanding  REQUIRES PT FOLLOW UP: Yes  Activity Tolerance  Activity Tolerance: Patient Tolerated treatment well     AM-PAC Score  AM-PAC Inpatient Mobility Raw Score : 18  AM-PAC Inpatient T-Scale Score : 43.63  Mobility Inpatient CMS 0-100% Score: 46.58  Mobility Inpatient CMS G-Code Modifier : CK          Goals  Short term goals  Time Frame for Short term goals: by discharge  Short term goal 1: Sit to stand with supervision  ONGOING  Short term goal 2: Pt will ambulate 400 feet with supervision ONGOING  Short term goal 3: Pt will be independent with LE HEP x 10 ONGOING  Short term goal 4: Pt will ambulate up and down 1 flight steps with rail and SBA ONGOING    Plan    Plan  Times per week: 2-5  Current Treatment Recommendations: Strengthening, Hotel manager, Therapist, nutritional, Personnel officer, Stair training, Training and development officer, Proofreader, Management consultant Devices  Type of devices: Call light within reach, Nurse notified, Gait belt, Chair alarm in place, Left in chair     Therapy Time   Individual Concurrent Group Co-treatment   Time In 1330         Time Out 1410         Minutes 40                Timed Code Treatment Minutes:   25    Total Treatment Minutes:  40    This note will serve as a discharge summary if patient is discharged prior to next treatment session.  Sheila Oats PT, DPT, [phone]

## 2017-04-30 NOTE — Plan of Care (Signed)
Problem: Falls - Risk of:  Goal: Will remain free from falls  Will remain free from falls    Outcome: Ongoing  Pt on BEA with standby asst, pt calls out appropriately    Problem: Nutrition  Goal: Optimal nutrition therapy  Outcome: Ongoing  Pt encouraged to eat small frequent meals as tolerated, will monitor    Problem: Infection - Central Venous Catheter-Associated Bloodstream Infection:  Goal: Will show no infection signs and symptoms  Will show no infection signs and symptoms   Outcome: Ongoing  Right PICC site remains free of signs/symptoms of infection. No drainage, edema, erythema, pain, or warmth noted at site. Dressing changes continue per protocol and on an as needed basis - see flowsheet.           Problem: Emotional/Psychosocial  Goal: Emotional status will stabilize  Emotional status will stabilize     Outcome: Ongoing  Pt anxious about future outcome    Problem: Discharge Planning:  Goal: Discharged to appropriate level of care  Discharged to appropriate level of care   Outcome: Ongoing  When appropriate    Problem: PROTECTIVE PRECAUTIONS  Goal: Patient will remain free of nosocomial Infections  Outcome: Ongoing  Pt showing no S/S of infection; Protective precautions followed this shift

## 2017-04-30 NOTE — Progress Notes (Signed)
Nutrition Assessment    Type and Reason for Visit: Reassess    Nutrition Recommendations:   1. PO Diet: Continue current diet general, low microbial  2. ONS: Continue to offer Magic Cup ONS bid  3. Monitor, record and encourage PO intake at all meals through admission.   4. Consume small/frequent meals and snacks to build appetite      Malnutrition Assessment:   Malnutrition Status: At risk for malnutrition   Context: Acute illness or injury   Findings of the 6 clinical characteristics of malnutrition (Minimum of 2 out of 6 clinical characteristics is required to make the diagnosis of moderate or severe Protein Calorie Malnutrition based on AND/ASPEN Guidelines):  1. Energy Intake-Less than or equal to 50%, greater than 7 days    2. Weight Loss- , unable to assess (wt loss noted from 7/30 to 8/6; no CBW assess wt status)  3. Fat Loss-Unable to assess,    4. Muscle Loss-Unable to assess,    5. Fluid Accumulation-No significant fluid accumulation,    6. Grip Strength-Not measured    Nutrition Diagnosis:    Problem: Inadequate oral intake   Etiology: related to  (acute illness)    . Signs and symptoms:  as evidenced by Diet history of poor intake    Nutrition Assessment:   Subjective Assessment: Follow-up for po and ONS intake. Pt reports fair appetite. Spouse present and states he is eating what he can. Pt reports he had french toast and apple juice for breakfast this morning. Pt denies nausea and reports it's more like reflux. Pt with no vomiting/diarrhea noted. Pt states no further constipation (resolved) and having formed BM's now. Pt reports he just wants orange flavored Magic Cup for lunch today. Pt likes ONS and has been consuming them.   Nutrition-Focused Physical Findings: Unmeasured output stool occurrence 3 x recorded from 8/30-8/31; +1 RLE and LLE edema noted   Wound Type: None   Current Nutrition Therapies:   Oral Diet Orders: General, Low Microbial    Oral Diet intake: 51-75%,  76-100%   Oral Nutrition Supplement (ONS) Orders: Frozen Oral Supplement   ONS intake: 76-100%   Anthropometric Measures:   Ht: 5\' 9"  (175.3 cm)    Current Body Wt: 181 lb (82.1 kg)   % Weight Change: 4.7% loss,  1 week from 189 lb 7/30 to 180 lb 8/6. No CBW at this time   Ideal Body Wt: 160 lb (72.6 kg),    BMI Classification: BMI 25.0 - 29.9 Overweight   Comparative Standards (Estimated Nutrition Needs):   Estimated Daily Total Kcal: 1308-6578   Estimated Daily Protein (g): 100-117    Estimated Intake vs Estimated Needs: Intake Improving    Nutrition Risk Level: Moderate    Nutrition Interventions:   Continue current diet, Continue current ONS  Continued Inpatient Monitoring    Nutrition Evaluation:    Evaluation: Progressing toward goals    Goals: Pt will consume and tolerate at least 75% of meals/supplements offered     Monitoring: Meal Intake, Supplement Intake, Pertinent Labs, Weight    See Adult Nutrition Doc Flowsheet for more detail.     Electronically signed by Campbell Stall, RD, LD on 04/30/17 at 4:41 PM    Contact Number: [phone]

## 2017-04-30 NOTE — Plan of Care (Signed)
Problem: Bleeding:  Goal: Will show no signs and symptoms of excessive bleeding  Will show no signs and symptoms of excessive bleeding   Outcome: Met This Shift  Patient's hemoglobin this AM:   Recent Labs      04/30/17   0352   HGB  7.7*     Patient's platelet count this AM:   Recent Labs      04/30/17   0352   PLT  19*    Thrombocytopenia Precautions in place.  Patient showing no signs or symptoms of active bleeding.  Transfusion not indicated at this time.  Patient verbalizes understanding of all instructions. Will continue to assess and implement POC. Call light within reach and hourly rounding in place.     Problem: Falls - Risk of:  Goal: Will remain free from falls  Will remain free from falls    Outcome: Met This Shift  Pt is a high fall risk. Bed/chair alarm active, call bell within reach      Problem: Pain:  Goal: Pain level will decrease  Pain level will decrease   Outcome: Met This Shift  Pt w/ pain to his rib cage.  Pt w/out complaints after PRN pain meds.  RN will continue to monitor.     Problem: Nutrition  Goal: Optimal nutrition therapy  Outcome: Met This Shift      Problem: Infection - Central Venous Catheter-Associated Bloodstream Infection:  Goal: Will show no infection signs and symptoms  Will show no infection signs and symptoms   Outcome: Met This Shift  CVC site remains free of signs/symptoms of infection. No drainage, edema, erythema, pain, or warmth noted at site. Dressing changes continue per protocol and on an as needed basis - see flowsheet.         Problem: Discharge Planning:  Goal: Discharged to appropriate level of care  Discharged to appropriate level of care   Outcome: Met This Shift      Problem: PROTECTIVE PRECAUTIONS  Goal: Patient will remain free of nosocomial Infections  Outcome: Met This Shift  Pt w/out fever or s/sx of infection, RN will monitor.

## 2017-04-30 NOTE — Other (Signed)
04/30/17 1329   Encounter Summary   Services provided to: Patient   Referral/Consult From: Rounding   Continue Visiting (es 8/31)   Complexity of Encounter Moderate   Length of Encounter 15 minutes   Spiritual/Religious   Type Spiritual support   Assessment Approachable;Anxious;Fearful   Intervention Discussed illness/injury and it's impact;Discussed relationship with God;Active listening;Contacted support as requested per patient/family request   Who? fr dale   Why? patient knows him   At Request Of patient   Outcome Receptive;Engaged in conversation

## 2017-04-30 NOTE — Care Coordination-Inpatient (Addendum)
Type of Admission  Relapsed Multiple Myeloma  History of Melphalan/Auto Transplant 08/28/16  DCEP started 04/26/17  C 1   Day 5    Central venous catheter  RUE TL PICC per Dr Aida Puffer in [place]    Update  04/23/17:  Admitted from [facility] office with possible relapse of multiple myeloma.  8/28: Greeted patient upon return from IR. SO, [name], present.   8/31: Tolerating therapy well. Working with PT/OT to strengthen. Wife hoping for ARU, but unsure if he is eligible.     Education  04/23/17:  Greeting patient on arrival to unit, states that  [facility] Navigator assisted him in letting family know of his admission to office.  8/28: Reviewed treatment calendar. Discussed possible symptoms to expect from this regimen.   Estranged wife is here today, and SO [name] is at work. Wife, Georgina Snell, states she wants to be here to support him and assist in his care, as [name] cannot be here so much due to work. She states Clinten is "not happy" that she is here, but she feels an obligation to him and to their children.   8/31: Awaiting today's recommendation from rehab to determine disposition.     Discharge  Expect discharge to home in [place] area with family as caregivers. Lives with wife, Georgina Snell.   8/31: Requested appts at [facility] for Saturday for OBI, and Monday for HFU. [facility] working on POT for [place].     Pending

## 2017-04-30 NOTE — Care Coordination-Inpatient (Signed)
[facility]    Patient aware and agreeable to services. Faxed orders to [facility] for d/c 9/1    Jerline Pain, LPN  Care Transition Nurse  [place]  [phone]

## 2017-05-01 LAB — LACTATE DEHYDROGENASE: LD: 467 U/L — ABNORMAL HIGH (ref 100–190)

## 2017-05-01 LAB — CBC WITH AUTO DIFFERENTIAL
Basophils %: 0 %
Basophils Absolute: 0 10*3/uL (ref 0.0–0.2)
Eosinophils %: 0 %
Eosinophils Absolute: 0 10*3/uL (ref 0.0–0.6)
Hematocrit: 22.1 % — ABNORMAL LOW (ref 40.5–52.5)
Hemoglobin: 7.6 g/dL — ABNORMAL LOW (ref 13.5–17.5)
Lymphocytes %: 10 %
Lymphocytes Absolute: 0.1 10*3/uL — ABNORMAL LOW (ref 1.0–5.1)
MCH: 29.4 pg (ref 26.0–34.0)
MCHC: 34.2 g/dL (ref 31.0–36.0)
MCV: 85.8 fL (ref 80.0–100.0)
MPV: 7.7 fL (ref 5.0–10.5)
Monocytes %: 2 %
Monocytes Absolute: 0 10*3/uL (ref 0.0–1.3)
Neutrophils %: 88 %
Neutrophils Absolute: 0.5 10*3/uL — CL (ref 1.7–7.7)
Platelets: 14 10*3/uL — CL (ref 135–450)
RBC: 2.58 M/uL — ABNORMAL LOW (ref 4.20–5.90)
RDW: 15.9 % — ABNORMAL HIGH (ref 12.4–15.4)
WBC: 0.6 10*3/uL — ABNORMAL LOW (ref 4.0–11.0)

## 2017-05-01 LAB — HEPATIC FUNCTION PANEL
ALT: 104 U/L — ABNORMAL HIGH (ref 10–40)
AST: 84 U/L — ABNORMAL HIGH (ref 15–37)
Albumin: 2.3 g/dL — ABNORMAL LOW (ref 3.4–5.0)
Alkaline Phosphatase: 145 U/L — ABNORMAL HIGH (ref 40–129)
Bilirubin, Direct: <0.2 mg/dL (ref 0.0–0.3)
Total Bilirubin: 0.6 mg/dL (ref 0.0–1.0)
Total Protein: 9 g/dL — ABNORMAL HIGH (ref 6.4–8.2)

## 2017-05-01 LAB — BASIC METABOLIC PANEL
Anion Gap: 10 (ref 3–16)
BUN: 40 mg/dL — ABNORMAL HIGH (ref 7–20)
CO2: 22 mmol/L (ref 21–32)
Calcium: 6.3 mg/dL — ABNORMAL LOW (ref 8.3–10.6)
Chloride: 98 mmol/L — ABNORMAL LOW (ref 99–110)
Creatinine: 1.1 mg/dL (ref 0.9–1.3)
GFR African American: >60 (ref >60)
GFR Non-African American: >60 (ref >60)
Glucose: 172 mg/dL — ABNORMAL HIGH (ref 70–99)
Potassium: 4.4 mmol/L (ref 3.5–5.1)
Sodium: 130 mmol/L — ABNORMAL LOW (ref 136–145)

## 2017-05-01 LAB — CMV BY PCR QUANTITATIVE
CMV DNA Quant: <227 IU/mL
CMV DNA,Qnt Interp: <2.4 log IU/mL
CMV DNA,Qnt Interp: NOT DETECTED
CMV DNA,Quant PCR: <2.6 log cpy/mL
CMVQ copy/ml: <390 {copies}/mL

## 2017-05-01 LAB — PREPARE PLATELETS: Dispense Status Blood Bank: TRANSFUSED

## 2017-05-01 LAB — URIC ACID: Uric Acid, Serum: 3.4 mg/dL — ABNORMAL LOW (ref 3.5–7.2)

## 2017-05-01 LAB — PHOSPHORUS: Phosphorus: 3.8 mg/dL (ref 2.5–4.9)

## 2017-05-01 MED ORDER — SENNOSIDES 8.6 MG PO TABS
8.6 MG | ORAL_TABLET | Freq: Every evening | ORAL | 0 refills | Status: DC
Start: 2017-05-01 — End: 2017-05-25

## 2017-05-01 MED ORDER — SODIUM CHLORIDE 0.9 % IV BOLUS
0.9 % | Freq: Once | INTRAVENOUS | Status: DC
Start: 2017-05-01 — End: 2017-05-01

## 2017-05-01 MED ORDER — GUAIFENESIN 100 MG/5ML PO SOLN
100 MG/5ML | ORAL | 0 refills | Status: DC | PRN
Start: 2017-05-01 — End: 2017-05-25

## 2017-05-01 MED ORDER — PROCHLORPERAZINE MALEATE 10 MG PO TABS
10 MG | ORAL_TABLET | Freq: Four times a day (QID) | ORAL | 3 refills | Status: DC | PRN
Start: 2017-05-01 — End: 2018-04-19

## 2017-05-01 MED ORDER — POLYETHYLENE GLYCOL 3350 17 G PO PACK
17 g | Freq: Every day | ORAL | 1 refills | Status: DC | PRN
Start: 2017-05-01 — End: 2017-05-25

## 2017-05-01 MED ORDER — GABAPENTIN 300 MG PO CAPS
300 MG | ORAL_CAPSULE | Freq: Three times a day (TID) | ORAL | 0 refills | Status: DC
Start: 2017-05-01 — End: 2018-02-28

## 2017-05-01 MED ORDER — HEPARIN SOD (PORK) LOCK FLUSH 100 UNIT/ML IV SOLN
100 UNIT/ML | Freq: Once | INTRAVENOUS | Status: AC
Start: 2017-05-01 — End: 2017-05-01
  Administered 2017-05-01: 16:00:00 1000 [IU]

## 2017-05-01 MED ORDER — LEVOFLOXACIN 500 MG PO TABS
500 MG | ORAL_TABLET | Freq: Every evening | ORAL | 0 refills | Status: DC
Start: 2017-05-01 — End: 2017-05-25

## 2017-05-01 MED ORDER — FLUCONAZOLE 200 MG PO TABS
200 MG | ORAL_TABLET | Freq: Every day | ORAL | 0 refills | Status: DC
Start: 2017-05-01 — End: 2017-05-25

## 2017-05-01 MED ORDER — AMLODIPINE BESYLATE 5 MG PO TABS
5 MG | ORAL_TABLET | Freq: Every day | ORAL | 3 refills | Status: DC
Start: 2017-05-01 — End: 2017-05-25

## 2017-05-01 MED ORDER — PANTOPRAZOLE SODIUM 40 MG PO TBEC
40 MG | ORAL_TABLET | Freq: Every day | ORAL | 3 refills | Status: DC
Start: 2017-05-01 — End: 2018-04-19

## 2017-05-01 MED FILL — SODIUM CHLORIDE 0.9 % IV SOLN: 0.9 % | INTRAVENOUS | Qty: 250

## 2017-05-01 MED FILL — GABAPENTIN 300 MG PO CAPS: 300 MG | ORAL | Qty: 1

## 2017-05-01 MED FILL — VALACYCLOVIR HCL 500 MG PO TABS: 500 MG | ORAL | Qty: 1

## 2017-05-01 MED FILL — GUAIFENESIN 100 MG/5ML PO SOLN: 100 MG/5ML | ORAL | Qty: 10

## 2017-05-01 MED FILL — BUDESONIDE 0.25 MG/2ML IN SUSP: 0.25 MG/2ML | RESPIRATORY_TRACT | Qty: 2

## 2017-05-01 MED FILL — OXYCODONE HCL 5 MG PO TABS: 5 MG | ORAL | Qty: 1

## 2017-05-01 MED FILL — ALBUTEROL SULFATE (2.5 MG/3ML) 0.083% IN NEBU: RESPIRATORY_TRACT | Qty: 3

## 2017-05-01 MED FILL — LEVAQUIN 500 MG PO TABS: 500 MG | ORAL | Qty: 1

## 2017-05-01 MED FILL — PANTOPRAZOLE SODIUM 40 MG PO TBEC: 40 MG | ORAL | Qty: 1

## 2017-05-01 MED FILL — FLUCONAZOLE 200 MG PO TABS: 200 MG | ORAL | Qty: 1

## 2017-05-01 MED FILL — AMLODIPINE BESYLATE 5 MG PO TABS: 5 MG | ORAL | Qty: 1

## 2017-05-01 MED FILL — SODIUM CHLORIDE 0.9 % IV SOLN: 0.9 % | INTRAVENOUS | Qty: 1000

## 2017-05-01 MED FILL — ZOLPIDEM TARTRATE 5 MG PO TABS: 5 MG | ORAL | Qty: 1

## 2017-05-01 MED FILL — FUROSEMIDE 40 MG PO TABS: 40 MG | ORAL | Qty: 1

## 2017-05-01 MED FILL — ALLOPURINOL 300 MG PO TABS: 300 MG | ORAL | Qty: 1

## 2017-05-01 MED FILL — HEPARIN LOCK FLUSH 100 UNIT/ML IV SOLN: 100 UNIT/ML | INTRAVENOUS | Qty: 10

## 2017-05-01 NOTE — Discharge Summary (Signed)
[facility] Discharge Summary    05/01/2017     David Watkins    MRN: [id number]    DOB: [birth year]     Date of adm:  04/23/17  Date of discharge:  05/01/17  Condition:  good    Referring MD:  Eulis [place]anner, [place] [address]      SUBJECTIVE:  Adm for C1 DCEP salvage chemtox for relapsed MM.  tol well and dc'd to home.    ECOG PS:  (2) Ambulatory and capable of self care, unable to carry out work activity, up and about > 50% or waking hours    Isolation: None    Medications    Scheduled Meds:  . senna  2 tablet Oral Nightly   . aluminum hydroxide  640 mg Oral TID WC   . amLODIPine  5 mg Oral Daily   . furosemide  40 mg Oral BID   . fluconazole  200 mg Oral Daily   . levofloxacin  500 mg Oral Nightly   . lidocaine 1 % injection  5 mL Intradermal Once   . sodium chloride flush  10 mL Intravenous 2 times per day   . allopurinol  300 mg Oral Daily   . pantoprazole  40 mg Oral QAM AC   . Saline Mouthwash  15 mL Swish & Spit 4x Daily AC & HS   . gabapentin  300 mg Oral TID   . valACYclovir  500 mg Oral BID   . zolpidem  5 mg Oral Nightly   . budesonide  0.25 mg Nebulization BID   . albuterol  2.5 mg Nebulization 4x daily     Continuous Infusions:  . sodium chloride 100 mL/hr at 04/30/17 1703     PRN Meds:.sodium chloride flush, guaiFENesin, prochlorperazine **OR** prochlorperazine, alteplase, magnesium hydroxide, Saline Mouthwash, acetaminophen, magnesium sulfate, oxyCODONE **OR** oxyCODONE, LORazepam **OR** LORazepam, polyethylene glycol    ROS   Constitutional: Denies fever, sweats. + wt loss. Overwhelming fatigue & weakness   Eyes: No visual changes or diplopia. No scleral icterus.   ENT: No Headaches, hearing loss or vertigo. No mouth sores or sore throat.   Cardiovascular: No chest pain, +dyspnea on exertion, no palpitations or loss of consciousness.    Respiratory: +dry cough, no wheezing, no sputum production. No hemoptysis.     Gastrointestinal: +RUQ pain, + appetite loss.  +constipation   Genitourinary: No dysuria, trouble voiding, or hematuria.   Musculoskeletal:  +generalized weakness. No joint complaints.   Integumentary: No rash or pruritis.   Neurological: No headache, diplopia. No change in gait, balance, or coordination. No paresthesias.   Endocrine: No temperature intolerance. No excessive thirst, fluid intake, or urination.    Hematologic/Lymphatic: +petechial rash to BLE's & trunk, no evidence of blood clots or swollen lymph nodes.   Allergic/Immunologic: No nasal congestion or hives.       Physical Exam:     I&O:      Intake/Output Summary (Last 24 hours) at 05/01/17 1019  Last data filed at 05/01/17 0559   Gross per 24 hour   Intake             3562 ml   Output             4600 ml   Net            -1038 ml       Vital Signs:  BP 114/76   Pulse 66  Temp 97.5 F (36.4 C) (Oral)   Resp 18   Ht 5' 9"  (1.753 m)   Wt 182 lb (82.6 kg)   SpO2 98%   BMI 26.88 kg/m     Weight:    Wt Readings from Last 3 Encounters:   05/01/17 182 lb (82.6 kg)   04/05/17 180 lb (81.6 kg)   03/29/17 189 lb 1.6 oz (85.8 kg)       General: Awake, alert and oriented, Anxious  HEENT: normocephalic, PERRL, no scleral erythema or icterus, Oral mucosa intact  NECK: supple without palpable adenopathy  SKIN: warm dry and intact without lesions rashes or masses. +Scattered petechiae to trunk & BLE's  CHEST: Normal BS bilaterally, dry nonproductive cough  CV: Normal S1 S2, RRR, no MRG  ABD: Non tender, ND normoactive BS, no palpable masses or hepatosplenomegaly  EXTREMITIES: without edema, denies calf tenderness  NEURO: CN II - XII grossly intact  CATHETER: RUE DL PICC (04/26/17 - IR) - site CDI    Data    CBC:   Recent Labs      04/29/17   0402  04/30/17   0352  05/01/17   0410   WBC  1.6*  0.8*  0.6*   HGB  8.1*  7.7*  7.6*   HCT  23.5*  22.3*  22.1*   MCV  86.1  85.3  85.8   PLT  32*  19*  14*     BMP/Mag:  Recent Labs      04/29/17   0402  04/29/17   1510  04/30/17   0352  05/01/17   0410    NA  133*  129*  131*  130*   K  4.2  4.2  4.5  4.4   CL  102  98*  99  98*   CO2  20*  22  21  22    PHOS  5.9*  4.9  4.6  3.8   BUN  45*  45*  43*  40*   CREATININE  1.1  1.0  0.9  1.1   MG  2.40   --    --    --      LIVP:   Recent Labs      04/29/17   0402  04/30/17   0352  05/01/17   0410   AST  92*  83*  84*   ALT  97*  96*  104*   BILIDIR  <0.2  <0.2  <0.2   BILITOT  0.5  0.6  0.6   ALKPHOS  177*  157*  145*     Coags:   Recent Labs      04/29/17   0402   APTT  30.4     Uric Acid   Recent Labs      04/29/17   0402  04/30/17   0352  05/01/17   0410   LABURIC  4.1  3.7  3.4*     DIAGNOSTIC IMAGING:  1. CT Chest 04/23/17:  1. Redemonstration of multiple lytic lesions throughout the skeleton compatible with patient's history of multiple myeloma.   2. 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 Stable scattered submillimeter pulmonary parenchymal nodules.   4. Marked centrilobular emphysematous changes with upper lobe predominance, stable.   5. Progression of splenomegaly       PATHOLOGY:  1. BM Bx/Asp  04/24/17  Bone marrow biopsy, clot section, aspirate smear, touch imprint,  peripheral blood smear and flow cytometry data:  Plasma cell leukemia.    There is a single continuous sheet of atypical plasma cells  (shown to be kappa monotypic via flow cytometry) effacing the bone  marrow. The cellularity is 100% and there is minimal evidence of  normal-appearing hematopoietic activity. Genetic studies are pending.  The morphologic features are most consistent with the plasmablastic  variant. There is a patchy 3+ increase in reticulin fibers.    2. Myeloma Studies 04/24/17:  SPEP/SIFE: monoclonal IgG kappa by immunofixation (6.65 g/dl)  SFLCs:   Free Kappa Light Chains    637  Free Lambda Light Chain    20.85  Free Kappa/Lambda Ratio   30.55  Immunoglobulins: IgA 106, IgG 6379, IgM 29  B2: 16.5  UPEP/UIFE:  Urine IFE shows a polyclonal increase in free kappa and/or   free lambda light chains. No monoclonal free light chains   (Bence Jones Protein) are detected. Urine IFE shows a   monoclonal IgG heavy chain with associated kappa light   chain.   UFLCs:   Free Urinary Kappa Light Chains     150.00 H   mg/dL   Free Urinary Lambda Light Chain      9.06 H   mg/dL    Free Urinary Kappa/Lambda Ratio      16.56 H   ratio     PROBLEM LIST:      IgG Kappa Multiple myeloma  Peripheral neuropathy  Depression  Erectile dysfunction  Hyperlipidemia  Hypertension  Insomnia  Low back pain    TREATMENT:      1. Rad Tx to T5-7, T10-L3, Right Scapula - 3000 cGy - Dr. Jamas Lav 03/20/16-04/01/16  2. RVD x1 03/20/16 - discontinued d/t rash   3. Velcade/Pomalyst/Dex x3 cycles 04/23/16-06/17/16 - discontinued d/t reaction to pomalyst  4. Velcade/Dex x 1 cycle 06/26/16 (last dose of dexamethasone 07/22/16  5. High-dose melphalan followed by administration of PBSCs 2.36 x10^6 cd34cells/kg on 08/28/16  6. Maintenance Revlimid 63m daily (12/2016-04/23/17)  7. Dexamethasone 437mdaily (04/24/17)  8. DCEP (04/26/17)    ASSESSMENT AND PLAN:     1. IgG kappa multiple myeloma: [facility] with progressive anorexia, nonproductive cough, 17 pound wt loss, progressiveweakness, DOE, & severe thrombocytopenia all concerning for disease relapse.  - h/o lytic lesions as well as cord compression at T6 and T11  - Initially PR after 5 cycles chemotherapy    Disease Eval: [place] relapse of MM concerning for plasma cell leukemia, bone marrow with single continuous sheet of plasma cells, see above    DCEP Cycle 1, Day +6    2. ID: Afebrile, presenting with severe cough & SOB, pneumonia ruled out on chest CT  - Cont diflucan, levaquin, & valtrex ppx while neutropenic    Abx Hx:  Merrem 8/24-8/27/18  Azithromycin 8/24-8/27/18    ID W/U:  - Blood Cx 04/23/17 NGTD final  - Procalcitonin elevated at 1.53, Strep  Pneumo UrAg neg, Galactomannan negative,  - Fungitel indeterminate (d/t interfering substances in the specimen), CMV neg, EBV PCR neg - low suspicion these will be + given CT findings    3. Heme: Pancytopenia r/t disease relapse & chemotherapy  - Transfuse for Hgb < 7 and Platelets < 10K  - Plt transfusion today  - PT/INR unable to be evaluated by convention laboratory methods (presumably from elevated serum protein), therefore reported as grossly elevated. No evidence  of clinical DIC or bleeding other than mild petechiae as noted above  - 8/26 - D-dimer 1476  - Factor 7 Assay 04/26/17 WNL  - Neulasta OBI at d/c (Saturday morning)    4. Metabolic: HypoNa, hyperPhos improving + ongoing hypoCa c/w TLS. AKI POA resolved. Elevated serum total protein c/w disease relapse, coming down. Wt increased from admit. Excellent uop now  - +Laboratory TLS: HyperPhos (improved), ongoing HypoCa  - HyperPhos: Cont Alternagel 640 mg PO TID, monitor Phos daily  - Cont allopurinol daily  - HypoCa: Would not replace with ongoing TLS & hyperphos. Correction of hyperphos should correct hypoCa  - Cont IVF hydration: NS @ 11m/hr  - Diuresis: Continue Lasix 40 mg po BID.  Give an extra 40 mg po in AM 8/29    5. Pulmonary: Chronic Bronchitis/COPD. H/o pulmonary nodule.   - Pulm Nodule: Cont to be stable - Cont to Monitor. 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 Also w/emphysematous changes.  - Cont robitussin cough syrup q4h PRN(Codeine cough syrup w/increased sedation)    6. GI/Nutrition: Appetite and oral intake is poor d/t critical illness.  - Cont low microbial diet    - Constipation: Resolved, Cont Miralax PRN. Scheduled Senna daily.    7. Acute Pain: acute pain to RUQ below ribs as well as to left chest rib pain.  +lytic lesions to ribs on CT. RLL pulmonary mass, presumably plasmacytoma  - Previously followedwith pain specialist as outpt but off all narcotics prior to admission  - Oxycodone PRN for acute pain    8. Cardiac: h/o HLD. 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  - Lipitor on hold for now  - Echo 07/13/16 w/abnormal (paradoxical) septal motion is present with preserved LVEF 55-60%.  - Continue norvasc 551mdaily (04/27/17) - HTN improved    9. Anxiety/Depression: Ongoing  - Ativan PRN    10. Insomnia:ongoing, has failed multiple sleep aids  - Cont sonata qHS    11. Peripheral Neuropathy: Ongoing  - Cont Gabapentin 300 mg TID     12. Bone Health: H/o spinal cord compression & diffuse lytic lesions t/o skeleton  - CT Chest 04/23/17 w/multiple lytic lesions throughout the skeleton   - Resume Ca/Vit D & zometa once outpt and disease & renal fxn stable    13. Critical Illness Myopathy: r/t disease relapse  - Cont PT/OT  - SW following - can d/c home with 24hr assist.       - Disposition: DC to home today with Neulasta OBI.  Followup on Mon      EdJannette SpannerMD

## 2017-05-01 NOTE — Progress Notes (Signed)
Reviewed discharge instructions with patient and  family members.  Reviewed discharge medications including dosing, schedule, indication, and adverse reactions.  Reviewed which medications were already taken today and next dosage due for each medication.      Reviewed signs and symptoms that prompt a call to the physician and appropriate phone numbers. Purple ER card given to the patient with explanations of its use.  Reviewed follow up appointments that have been made in [facility] and Outpatient Oncology.  Low microbial diet, activity restrictions, and increased risk of infection were reviewed.     Patient is being discharged with IV access d/t need for ongoing therapy:      Type:  picc                         Date of placement:  04/26/17  Surgeon:  Aida Puffer  Plan:continue   Next dressing change due on: 05/08/17  Cap changes due on: 05/08/17  CVC care and maintenance was reviewed with patient and family members.  Pt verbalizes understanding of line care and maintenance.      Patient verbalized understanding of all instructions and questions were answered to his. satisfaction.  Signed discharge instructions were given to the patient and a copy placed in the paper-lite chart.  Patient discharged to home per wheelchair with family members.      Annie Sable

## 2017-05-01 NOTE — Plan of Care (Signed)
Problem: Falls - Risk of:  Goal: Will remain free from falls  Will remain free from falls    Outcome: Ongoing  Pt on BEA with standby asst, pt calls out appropriately    Problem: Nutrition  Goal: Optimal nutrition therapy  Outcome: Ongoing  Pt encouraged to eat small frequent meals as tolerated, will monitor    Problem: Infection - Central Venous Catheter-Associated Bloodstream Infection:  Goal: Will show no infection signs and symptoms  Will show no infection signs and symptoms   Outcome: Ongoing  Right PICC site remains free of signs/symptoms of infection. No drainage, edema, erythema, pain, or warmth noted at site. Dressing changes continue per protocol and on an as needed basis - see flowsheet.           Problem: Emotional/Psychosocial  Goal: Emotional status will stabilize  Emotional status will stabilize     Outcome: Ongoing  Pt anxious about future outcome    Problem: Discharge Planning:  Goal: Discharged to appropriate level of care  Discharged to appropriate level of care   Outcome: Ongoing  When appropriate    Problem: PROTECTIVE PRECAUTIONS  Goal: Patient will remain free of nosocomial Infections  Outcome: Ongoing  Pt showing no S/S of infection; Protective precautions followed this shift

## 2017-05-01 NOTE — Plan of Care (Signed)
Problem: Pain:  Goal: Pain level will decrease  Pain level will decrease   Outcome: Ongoing  Pt c/o rib  pain of 6 out of 10, pain meds administered per orders, will monitor

## 2017-05-01 NOTE — Plan of Care (Signed)
Problem: Bleeding:  Goal: Will show no signs and symptoms of excessive bleeding  Will show no signs and symptoms of excessive bleeding   Outcome: Completed Date Met: 05/01/17  Patient's hemoglobin this AM:   Recent Labs      05/01/17   0410   HGB  7.6*     Patient's platelet count this AM:   Recent Labs      05/01/17   0410   PLT  14*    Thrombocytopenia not present at this time.  Patient showing no signs or symptoms of active bleeding.  Patient transfused blood products per orders - see flowsheet.  Patient verbalizes understanding of all instructions. Will continue to assess and implement POC. Call light within reach and hourly rounding in place.     Problem: Falls - Risk of:  Goal: Will remain free from falls  Will remain free from falls    Outcome: Completed Date Met: 05/01/17  Orthostatic vital signs obtained at start of shift - see flowsheet for details.  Pt does not meet criteria for orthostasis.  Pt is a Med fall risk. See Leamon Arnt Fall Score and ABCDS Injury Risk assessments.     - Screening for Orthostasis AND not a High Falls Risk per MORSE/ABCDS: Pt bed is in low position, side rails up, call light and belongings are in reach.  Fall risk light is on outside pts room.  Pt encouraged to call for assistance as needed. Will continue with hourly rounds for PO intake, pain needs, toileting and repositioning as needed.     Problem: Pain:  Goal: Pain level will decrease  Pain level will decrease   Outcome: Ongoing  Pt denies pain this shift      Problem: Infection - Central Venous Catheter-Associated Bloodstream Infection:  Goal: Will show no infection signs and symptoms  Will show no infection signs and symptoms   Outcome: Completed Date Met: 05/01/17  CVC site remains free of signs/symptoms of infection. No drainage, edema, erythema, pain, or warmth noted at site. Dressing changes continue per protocol and on an as needed basis - see flowsheet.     Compliant with [facility] Bath Protocol:  Performed CHG bath today per  [facility] protocol utilizing CHG solution in the shower.  CVC site cleansed with CHG wipe over dressing, skin surrounding dressing, and first 6" of IV tubing.  Pt tolerated well.  Continued to encourage daily CHG bathing per [facility] protocol.      Problem: Discharge Planning:  Goal: Discharged to appropriate level of care  Discharged to appropriate level of care   Outcome: Completed Date Met: 05/01/17  Pt discharged to home as ordered. Pt accompanied by s.o. [name] and brother.  Pt to have in home PT/OT.  Pt went to [facility] for neulasta obi.  Pt with no questions at time of discharge . No further needs. Pt to follow up at [facility] on Monday 9/3

## 2017-05-01 NOTE — Discharge Instructions (Signed)
[place] Discharge Instructions    Call for Questions/Concerns:  [phone] [facility] office  The phone number listed above is available 24 hrs/7 days per week  [facility] is open M-F 8am-4:30pm; Sat-Sun/Holidays 8am-1pm    Symptoms to Report Immediately:     Fever of 100.5 or greater   Vomiting without relief after use of anti-nausea medication   Severe abdominal cramping   Diarrhea: More than 3 loose, watery bowel movements in a 24 hour period   Unusual or excessive bleeding from your mouth, nose, rectum, bladder    Sudden onset of shortness of breath or chest pain   Signs/symptoms of infection: redness, warmth, swelling-particularly to central line site    Report to Physician's office within 24 hours:     Pain not relieved by pain medication   Change in urination-odor, cloudiness, frequency, or pain with urination   Flu-like symptoms   Skin changes-rash, hives, redness or peeling of skin    Additional Instructions:     Avoid people with colds, flu-like symptoms, or any sign of infection   Drink plenty of fluids-attempt to consume 2-3 liters (60-100 ounces) of fluids/24 hour period   Continue low microbial diet until instructed by physician to resume normal diet   Bring all of your medications with you to your doctor's appointments   Bring your current medicine list to each hospital and office visit    [place]:    You are being discharged with IV access due to need for ongoing therapy.  Below is pertinent information regarding your IV that your next provider may need to know:  Type: POWER PICC                      Date of placement:  04/26/17  Surgeon:  Aida Puffer  Plan:continue   Next dressing change due on: 05/08/17  Cap changes due on: 05/08/17    CVC care and maintenance was reviewed with patient and   Pt verbalizes understanding of line care and maintenance.          04/23/2017 4:05 PM  David Watkins            My Discharge Checklist    Here at the [facility], we want to make sure you have the help you will need once you leave the hospital.  We are going to go over your discharge instructions with you. We give these to you in writing so you will have a reference if you have questions about symptoms or problems to look for after you leave the hospital.     We know you want to feel better and get home soon. Please answer these questions so we can be sure you have what you need, your questions are answered, and you feel prepared for discharge.    Yes No Do you understand your diagnosis?  Yes No Do you know when and who you need to follow up with?  Yes No Do you feel ready to go home & take care of your daily needs?  Yes No Do you have the help you need at home?  Yes No Do you understand what medications your are taking?  Yes No Do you understand what your medications are for?  Yes No Do you understand what medication side effects to watch for?  Yes No Do you know what symptoms or health problems that require an immediate call to your physician?  Yes No Do you feel ready for discharge?  Yes No Are there any questions that you have re: how to care for yourself at home?  Yes No Do you know about MyChart?    If you have any questions after you get home, feel free to call the unit and ask to speak with your nurse.  In about 7-10 days you will receive a survey. We value your opinion and hope that you have received care that will enable you to choose the best scores when completing the survey.    It was our pleasure to take care of you,  [place] [facility]           [phone]                                                     Outpatient Infusion [phone]    [facility] Physician Office [place] or Procedural Scheduling [phone] ([phone])       Continuity of Care Form    Patient Name: David Watkins   DOB:  Jun 24, 1956  MRN:  [id number]    Admit  date:  04/23/2017  Discharge date:      Code Status Order: Full Code   Advance Directives: {YES OR NO:20022}    Admitting Physician:  Harlene Salts, MD  PCP: Eulis Canner, MD    Discharging Nurse: [facility] Unit/Room#: 3512/3512-01  Discharging Unit Phone Number: ***    Emergency Contact:   Contact 1: Name: David Watkins  Contact 1: Number: [phone]  Contact 1: Relationship: friend    Past Surgical History:  Past Surgical History:   Procedure Laterality Date   . BONE MARROW BIOPSY     . BONE MARROW TRANSPLANT     . OTHER SURGICAL HISTORY Left 08/17/2016    trifusion cath placement   . PRE-MALIGNANT / BENIGN SKIN LESION EXCISION Left 03/29/2017    EXCISE LESION LEFT EXTERNAL EAR WITH FROZEN SECTION, FULL THICKNESS SKIN GRAFT       Immunization History:   Immunization History   Administered Date(s) Administered   . Influenza Virus Vaccine 06/01/2016       Active Problems:  Patient Active Problem List   Diagnosis Code   . Multiple myeloma not having achieved remission (HCC) C90.00   . 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 Multiple myeloma in remission (HCC) C90.01   . Moderate malnutrition (HCC) E44.0   . Open wound of left ear S01.302A   . Ear lesion H93.90   . Skin lesion L98.9   . 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       Isolation/Infection:   Isolation          No Isolation            Nurse Assessment:  Last Vital Signs: BP 119/74   Pulse 75   Temp 97.7 F (36.5  C) (Oral)   Resp 17   Ht 5' 9"  (1.753 m)   Wt 181 lb 9.6 oz (82.4 kg)   SpO2 98%   BMI 26.82 kg/m     Last documented pain score (0-10 scale): Pain Level: 6  Last Weight:   Wt Readings from Last 1 Encounters:   04/30/17 181 lb 9.6 oz (82.4 kg)     Mental Status:  {IP PT MENTAL STATUS:20030}     IV Access:  {MH COC IV ACCESS:304088262}    Nursing Mobility/ADLs:  Walking   {CHP DME RJJO:841660630}  Transfer  {CHP DME ZSWF:093235573}  Bathing  {CHP DME UKGU:542706237}  Dressing  {CHP DME SEGB:151761607}  Toileting  {CHP DME  PXTG:626948546}  Feeding  {CHP DME EVOJ:500938182}  Med Admin  {CHP DME XHBZ:169678938}  Med Delivery   {MH COC MED Delivery:304088264}    Wound Care Documentation and Therapy:  Incision 03/29/17 Ear Left (Active)   Wound Assessment UTA 03/29/2017  8:58 AM   Peri-wound Assessment UTA 03/29/2017  8:58 AM   Closure Sutures 03/29/2017  8:58 AM   Culture Taken No 03/29/2017  8:04 AM   Drainage Amount None 03/29/2017  8:58 AM   Odor None 03/29/2017  8:58 AM   Dressing/Treatment Xeroform 03/29/2017  8:58 AM   Dressing Changed Changed/New 03/29/2017  8:58 AM   Dressing Status Clean;Intact;Dry 03/29/2017  8:58 AM   Number of days: 32        Elimination:  Continence:    Bowel: {YES / BO:17510}   Bladder: {YES / CH:85277}  Urinary Catheter: {Urinary Catheter:304088013}   Colostomy/Ileostomy/Ileal Conduit: {YES / OE:42353}       Date of Last BM: ***    Intake/Output Summary (Last 24 hours) at 04/30/17 1250  Last data filed at 04/30/17 1149   Gross per 24 hour   Intake             4304 ml   Output             3865 ml   Net              439 ml     I/O last 3 completed shifts:  In: 6144 [P.O.:1020; I.V.:3304]  Out: 4240 [Urine:4240]    Safety Concerns:     {MH COC Safety Concerns:304088272}    Impairments/Disabilities:      [place] COC Impairments/Disabilities:304088273}    Nutrition Therapy:  Current Nutrition Therapy:   [place] COC Diet List:304088271}    Routes of Feeding: {CHP DME Other Feedings:304088042}  Liquids: {Slp liquid thickness:30034}  Daily Fluid Restriction: {CHP DME Yes amt example:304088041}  Last Modified Barium Swallow with Video (Video Swallowing Test): {Done Not Done RXVQ:008676195}    Treatments at the Time of Hospital Discharge:   Respiratory Treatments: ***  Oxygen Therapy:  {Therapy; copd oxygen:17808}  Ventilator:    {MH CC Vent KDTO:671245809}    Rehab Therapies: {THERAPEUTIC INTERVENTION:[phone]}  Weight Bearing Status/Restrictions: [place] CC Weight Bearing:304508812}  Other Medical Equipment (for information only,  NOT a DME order):  {EQUIPMENT:304520077}  Other Treatments: ***    Patient's personal belongings (please select all that are sent with patient):  {CHP DME Belongings:304088044}    RN SIGNATURE:  {Esignature:304088025}    CASE MANAGEMENT/SOCIAL WORK SECTION    Inpatient Status Date: ***    Readmission Risk Assessment Score:  Readmission Risk              Risk of Unplanned Readmission:        29  Discharging to Facility/ Agency    Name:    Address:   Phone:   Fax:    Dialysis Facility (if applicable)    Name:   Address:   Dialysis Schedule:   Phone:   Fax:    Case Manager/Social Worker signature: {Esignature:304088025}    PHYSICIAN SECTION    Prognosis: {[phone]}    Condition at Discharge: [place] Patient Condition:304088024}    Rehab Potential (if transferring to Rehab): {[phone]}    Recommended Labs or Other Treatments After Discharge: ***    Physician Certification: I certify the above information and transfer of David Watkins  is necessary for the continuing treatment of the diagnosis listed and that he requires {Admit to Appropriate Level of Care:20763} for {GREATER/LESS:304500278} 30 days.     Update Admission H&P: {CHP DME Changes in MVHQI:696295284}    PHYSICIAN SIGNATURE:  {Esignature:304088025}

## 2017-05-01 NOTE — Plan of Care (Signed)
Problem: Bleeding:  Goal: Will show no signs and symptoms of excessive bleeding  Will show no signs and symptoms of excessive bleeding   Outcome: Ongoing  Patient's hemoglobin this AM:   Recent Labs      05/01/17   0410   HGB  7.6*     Patient's platelet count this AM:   Recent Labs      05/01/17   0410   PLT  14*    Thrombocytopenia Precautions in place.  Patient showing no signs or symptoms of active bleeding.  Transfusion not indicated at this time.  Patient verbalizes understanding of all instructions. Will continue to assess and implement POC. Call light within reach and hourly rounding in place.

## 2017-05-03 ENCOUNTER — Inpatient Hospital Stay: Admit: 2017-05-03 | Payer: BLUE CROSS/BLUE SHIELD | Primary: Hematology & Oncology

## 2017-05-03 DIAGNOSIS — C9002 Multiple myeloma in relapse: Secondary | ICD-10-CM

## 2017-05-03 LAB — EKG 12-LEAD
Atrial Rate: 88 {beats}/min
P Axis: 79 degrees
P-R Interval: 142 ms
Q-T Interval: 394 ms
QRS Duration: 98 ms
QTc Calculation (Bazett): 476 ms
R Axis: 69 degrees
T Axis: 65 degrees
Ventricular Rate: 88 {beats}/min

## 2017-05-03 MED ORDER — HEPARIN SOD (PORK) LOCK FLUSH 100 UNIT/ML IV SOLN
100 UNIT/ML | INTRAVENOUS | Status: DC | PRN
Start: 2017-05-03 — End: 2017-05-04
  Administered 2017-05-03: 16:00:00 300 [IU] via INTRAVENOUS

## 2017-05-03 MED FILL — HEPARIN LOCK FLUSH 100 UNIT/ML IV SOLN: 100 UNIT/ML | INTRAVENOUS | Qty: 5

## 2017-05-03 NOTE — Progress Notes (Signed)
Patient arrived to [place] for blood transfusion.  Type and screen were drawn; however, [facility] notified [place] that the patient did not meet parameters for blood transfusion.  Patient's line was locked with heparin.  He was discharged ambulatory to home with wife.

## 2017-05-05 ENCOUNTER — Inpatient Hospital Stay: Admit: 2017-05-05 | Discharge: 2017-05-05 | Payer: BLUE CROSS/BLUE SHIELD | Primary: Hematology & Oncology

## 2017-05-05 DIAGNOSIS — C9002 Multiple myeloma in relapse: Secondary | ICD-10-CM

## 2017-05-05 LAB — PREPARE PLATELETS: Dispense Status Blood Bank: TRANSFUSED

## 2017-05-05 MED ORDER — HEPARIN SOD (PORK) LOCK FLUSH 100 UNIT/ML IV SOLN
100 UNIT/ML | INTRAVENOUS | Status: DC | PRN
Start: 2017-05-05 — End: 2017-05-06
  Administered 2017-05-05: 19:00:00 300 [IU]

## 2017-05-05 MED ORDER — SODIUM CHLORIDE 0.9 % IV BOLUS
0.9 % | Freq: Once | INTRAVENOUS | Status: DC
Start: 2017-05-05 — End: 2017-05-06

## 2017-05-05 MED FILL — HEPARIN LOCK FLUSH 100 UNIT/ML IV SOLN: 100 UNIT/ML | INTRAVENOUS | Qty: 5

## 2017-05-05 NOTE — Plan of Care (Signed)
Problem: KNOWLEDGE DEFICIT  Goal: Patient/S.O. demonstrates understanding of disease process, treatment plan, medications, and discharge instructions.  Outcome: Ongoing  Patient's hemoglobin this AM: 8.0  Patient's platelet count this AM: 3.0   Pt seen and assessed at [facility].  Seen at [place] today for 1 unit of SDP per standing orders for above lab values.  Blood products transfused per [facility] policy.  Pt tolerated transfusion well and without incident. No s/s of bleeding noted or reported by patient  Pt verbalizes understanding of discharge instructions.  Discharged via wheelchair to home with a friend.

## 2017-05-05 NOTE — Discharge Instructions (Signed)
Preventing Falls: Care Instructions  Your Care Instructions    Getting around your home safely can be a challenge if you have injuries or health problems that make it easy for you to fall. Loose rugs and furniture in walkways are among the dangers for many older people who have problems walking or who have poor eyesight. People who have conditions such as arthritis, osteoporosis, or dementia also have to be careful not to fall.  You can make your home safer with a few simple measures.  Follow-up care is a key part of your treatment and safety. Be sure to make and go to all appointments, and call your doctor if you are having problems. It's also a good idea to know your test results and keep a list of the medicines you take.  How can you care for yourself at home?  Taking care of yourself   You may get dizzy if you do not drink enough water. To prevent dehydration, drink plenty of fluids, enough so that your urine is light yellow or clear like water. Choose water and other caffeine-free clear liquids. If you have kidney, heart, or liver disease and have to limit fluids, talk with your doctor before you increase the amount of fluids you drink.   Exercise regularly to improve your strength, muscle tone, and balance. Walk if you can. Swimming may be a good choice if you cannot walk easily.   Have your vision and hearing checked each year or any time you notice a change. If you have trouble seeing and hearing, you might not be able to avoid objects and could lose your balance.   Know the side effects of the medicines you take. Ask your doctor or pharmacist whether the medicines you take can affect your balance. Sleeping pills or sedatives can affect your balance.   Limit the amount of alcohol you drink. Alcohol can impair your balance and other senses.   Ask your doctor whether calluses or corns on your feet need to be removed. If you wear loose-fitting shoes because of calluses or corns, you can lose your  balance and fall.   Talk to your doctor if you have numbness in your feet.  Preventing falls at home   Remove raised doorway thresholds, throw rugs, and clutter. Repair loose carpet or raised areas in the floor.   Move furniture and electrical cords to keep them out of walking paths.   Use nonskid floor wax, and wipe up spills right away, especially on ceramic tile floors.   If you use a walker or cane, put rubber tips on it. If you use crutches, clean the bottoms of them regularly with an abrasive pad, such as steel wool.   Keep your house well lit, especially stairways, porches, and outside walkways. Use night-lights in areas such as hallways and bathrooms. Add extra light switches or use remote switches (such as switches that go on or off when you clap your hands) to make it easier to turn lights on if you have to get up during the night.   Install sturdy handrails on stairways.   Move items in your cabinets so that the things you use a lot are on the lower shelves (about waist level).   Keep a cordless phone and a flashlight with new batteries by your bed. If possible, put a phone in each of the main rooms of your house, or carry a cell phone in case you fall and cannot reach a phone. Or, you can   wear a device around your neck or wrist. You push a button that sends a signal for help.   Wear low-heeled shoes that fit well and give your feet good support. Use footwear with nonskid soles. Check the heels and soles of your shoes for wear. Repair or replace worn heels or soles.   Do not wear socks without shoes on wood floors.   Walk on the grass when the sidewalks are slippery. If you live in an area that gets snow and ice in the winter, sprinkle salt on slippery steps and sidewalks.  Preventing falls in the bath   Install grab bars and nonskid mats inside and outside your shower or tub and near the toilet and sinks.   Use shower chairs and bath benches.   Use a hand-held shower head that will allow  you to sit while showering.   Get into a tub or shower by putting the weaker leg in first. Get out of a tub or shower with your strong side first.   Repair loose toilet seats and consider installing a raised toilet seat to make getting on and off the toilet easier.   Keep your bathroom door unlocked while you are in the shower.  Where can you learn more?  Go to [email or web address] and sign in to your MyChart account. Enter G117 in the [place] box to learn more about "Preventing Falls: Care Instructions."     If you do not have an account, please click on the "Sign Up Now" link.  Current as of: Jan 10, 2016  Content Version: 11.7   2006-2018 Healthwise, Incorporated. Care instructions adapted under license by [facility]. If you have questions about a medical condition or this instruction, always ask your healthcare professional. [place] any warranty or liability for your use of this information.       Thrombocytopenia: Care Instructions  Your Care Instructions    Thrombocytopenia is a low number of platelets in the blood. Platelets are the cells that help blood clot. If you don't have enough of them, your blood cannot clot well. So it is harder to stop bleeding.  You may have low platelets because your bone marrow does not make them. Or your body's defenses (immune system) may destroy them.  Having an enlarged spleen can also reduce the number of platelets in your blood. This is because they can get trapped in the enlarged spleen.  Some diseases or medicines may also cause low platelets. But platelets may go back to normal levels if the disease is treated or the medicine is stopped.  You may not need treatment if your problem is mild. If you do need treatment, you may have platelets added to your blood. Or you may get medicine to stop the loss of platelets or help your body make them.  Follow-up care is a key part of your treatment and safety. Be  sure to make and go to all appointments, and call your doctor if you are having problems. It's also a good idea to know your test results and keep a list of the medicines you take.  How can you care for yourself at home?   Be safe with medicines. Take your medicines exactly as prescribed. Call your doctor if you think you are having a problem with your medicine.   Do not take aspirin or anti-inflammatory medicines unless your doctor says it is okay. Examples are ibuprofen (Advil, Motrin) and naproxen (Aleve). They may increase the risk  of bleeding.   Avoid contact sports or activities that could cause you to fall.  When should you call for help?  Call 911 anytime you think you may need emergency care. For example, call if:    You passed out (lost consciousness).     You have signs of severe bleeding, which includes:   You have a severe headache that is different from past headaches.   You vomit blood or what looks like coffee grounds.   Your stools are maroon or very bloody.   Call your doctor now or seek immediate medical care if:    You are dizzy or lightheaded, or you feel like you may faint.     You have abnormal bleeding, such as:   Your stools are black and look like tar, or they have streaks of blood.   You have blood in your urine.   You have joint pain.   You have bruises or blood spots under your skin.   Watch closely for changes in your health, and be sure to contact your doctor if:    You do not get better as expected.   Where can you learn more?  Go to [email or web address] and sign in to your MyChart account. Enter [phone] in the [place] box to learn more about "Thrombocytopenia: Care Instructions."     If you do not have an account, please click on the "Sign Up Now" link.  Current as of: June 08, 2016  Content Version: 11.7   2006-2018 Healthwise, Incorporated. Care instructions adapted under license by [facility]. If you have questions about  a medical condition or this instruction, always ask your healthcare professional. [place] any warranty or liability for your use of this information.

## 2017-05-07 ENCOUNTER — Inpatient Hospital Stay: Admit: 2017-05-07 | Payer: BLUE CROSS/BLUE SHIELD | Primary: Hematology & Oncology

## 2017-05-07 DIAGNOSIS — C9002 Multiple myeloma in relapse: Secondary | ICD-10-CM

## 2017-05-07 LAB — TYPE AND SCREEN
ABO/Rh: O POS
Antibody Screen: NEGATIVE

## 2017-05-07 LAB — PREPARE RBC (CROSSMATCH): Dispense Status Blood Bank: TRANSFUSED

## 2017-05-07 LAB — PREPARE PLATELETS: Dispense Status Blood Bank: TRANSFUSED

## 2017-05-07 MED ORDER — HEPARIN SOD (PORK) LOCK FLUSH 100 UNIT/ML IV SOLN
100 UNIT/ML | INTRAVENOUS | Status: DC | PRN
Start: 2017-05-07 — End: 2017-05-08
  Administered 2017-05-07: 21:00:00 500 [IU]

## 2017-05-07 MED ORDER — NORMAL SALINE FLUSH 0.9 % IV SOLN
0.9 % | INTRAVENOUS | Status: DC | PRN
Start: 2017-05-07 — End: 2017-05-08

## 2017-05-07 MED ORDER — SODIUM CHLORIDE 0.9 % IV BOLUS
0.9 % | Freq: Once | INTRAVENOUS | Status: AC
Start: 2017-05-07 — End: 2017-05-07
  Administered 2017-05-07: 18:00:00 1000 mL via INTRAVENOUS

## 2017-05-07 MED FILL — HEPARIN LOCK FLUSH 100 UNIT/ML IV SOLN: 100 UNIT/ML | INTRAVENOUS | Qty: 10

## 2017-05-07 NOTE — Oncology Nurse Navigation (Signed)
PRBC complete, pt tolerated well

## 2017-05-07 NOTE — Plan of Care (Signed)
Problem: Bleeding:  Goal: Will show no signs and symptoms of excessive bleeding  Will show no signs and symptoms of excessive bleeding  Outcome: Ongoing  Patient's hemoglobin this AM: 7.0  Patient's platelet count this AM: 11  Pt seen and assessed at [facility].  Seen at [place] today for 1 unit PRBC and 1 unit Platelet transfusion per standing orders for above lab values per [facility].  Blood products transfusing per [facility] policy.  Pt tolerating transfusion well and without incident at this time.  Pt verbalizes understanding of discharge instructions.     Problem: Fluid Volume - Imbalance:  Goal: Absence of imbalanced fluid volume signs and symptoms  Absence of imbalanced fluid volume signs and symptoms  Outcome: Ongoing  Pt seen and assessed at [place] today for 1 L normal saline infusion per orders from Dr. Corky Sox, pt with syncopal episode in [facility] office, fall precautions initiated.  Infused per [facility] policy.  Monitoring completed for fluid overload- see flowsheet.  Pt tolerating infusion well and without incident.  Pt verbalizes understanding of discharge instructions.  Pt to be discharged via wheelchair to home with family member.

## 2017-05-10 ENCOUNTER — Inpatient Hospital Stay: Admit: 2017-05-10 | Discharge: 2017-05-10 | Payer: BLUE CROSS/BLUE SHIELD | Primary: Hematology & Oncology

## 2017-05-10 DIAGNOSIS — C9002 Multiple myeloma in relapse: Secondary | ICD-10-CM

## 2017-05-10 LAB — PREPARE PLATELETS: Dispense Status Blood Bank: TRANSFUSED

## 2017-05-10 MED ORDER — NORMAL SALINE FLUSH 0.9 % IV SOLN
0.9 % | INTRAVENOUS | Status: DC | PRN
Start: 2017-05-10 — End: 2017-05-11
  Administered 2017-05-10: 16:00:00 20 mL via INTRAVENOUS

## 2017-05-10 MED ORDER — HEPARIN SOD (PORK) LOCK FLUSH 100 UNIT/ML IV SOLN
100 UNIT/ML | INTRAVENOUS | Status: DC | PRN
Start: 2017-05-10 — End: 2017-05-11
  Administered 2017-05-10: 16:00:00 500 [IU]

## 2017-05-10 NOTE — Plan of Care (Signed)
Problem: Bleeding:  Goal: Will show no signs and symptoms of excessive bleeding  Will show no signs and symptoms of excessive bleeding   Patient's hemoglobin this AM: 7.5  Patient's platelet count this AM: 10.0  Pt seen and assessed at [facility].  Seen at [place] today for one Platelet transfusion per standing orders for above lab values.  Blood products transfused per [facility] policy.  Pt tolerated transfusion well and without incident.  Pt verbalizes understanding of discharge instructions.  Discharged ambulatory to home.      Problem: Falls - Risk of:  Goal: Absence of physical injury  Absence of physical injury    Outcome: Met This Shift  Pt is a medium fall risk.   Explained fall risk precautions to pt and rationale behind their use to keep the patient safe. Belongings are in reach. Pt encouraged to notify staff for any and all assistance. Staff present in tx suite throughout entirety of pts treatment to monitor and protect from falls.  Assistance provided when ambulating to restroom utilizing Stay With Me.

## 2017-05-13 ENCOUNTER — Inpatient Hospital Stay: Admit: 2017-05-13 | Payer: BLUE CROSS/BLUE SHIELD | Primary: Hematology & Oncology

## 2017-05-13 DIAGNOSIS — C9002 Multiple myeloma in relapse: Secondary | ICD-10-CM

## 2017-05-13 LAB — TYPE AND SCREEN
ABO/Rh: O POS
Antibody Screen: NEGATIVE

## 2017-05-13 LAB — PREPARE RBC (CROSSMATCH): Dispense Status Blood Bank: TRANSFUSED

## 2017-05-13 LAB — PREPARE PLATELETS: Dispense Status Blood Bank: TRANSFUSED

## 2017-05-13 MED ORDER — HEPARIN SOD (PORK) LOCK FLUSH 100 UNIT/ML IV SOLN
100 UNIT/ML | INTRAVENOUS | Status: DC | PRN
Start: 2017-05-13 — End: 2017-05-14
  Administered 2017-05-13: 20:00:00 500 [IU]

## 2017-05-13 MED FILL — HEPARIN LOCK FLUSH 100 UNIT/ML IV SOLN: 100 UNIT/ML | INTRAVENOUS | Qty: 5

## 2017-05-13 NOTE — Plan of Care (Signed)
Problem: KNOWLEDGE DEFICIT  Goal: Patient/S.O. demonstrates understanding of disease process, treatment plan, medications, and discharge instructions.  Outcome: Ongoing  Patient's hemoglobin this AM: 6.7  Patient's platelet count this AM: 13  Pt seen and assessed at [facility].  Seen at [place] today for 1 unit PRBC transfusion and 1 unit Platelet transfusion per standing orders for above lab values per [facility].  Blood products transfused per [facility] policy.  Pt tolerated transfusion well and without incident.  Pt verbalizes understanding of discharge instructions.  Discharged ambulatory to home per self.

## 2017-05-17 ENCOUNTER — Inpatient Hospital Stay: Admit: 2017-05-17 | Discharge: 2017-05-17 | Payer: BLUE CROSS/BLUE SHIELD | Primary: Hematology & Oncology

## 2017-05-17 DIAGNOSIS — C9002 Multiple myeloma in relapse: Secondary | ICD-10-CM

## 2017-05-17 LAB — PREPARE PLATELETS: Dispense Status Blood Bank: TRANSFUSED

## 2017-05-17 MED ORDER — HEPARIN SOD (PORK) LOCK FLUSH 100 UNIT/ML IV SOLN
100 UNIT/ML | INTRAVENOUS | Status: DC | PRN
Start: 2017-05-17 — End: 2017-05-18
  Administered 2017-05-17: 18:00:00 300 [IU]

## 2017-05-17 MED ORDER — DIPHENHYDRAMINE HCL 25 MG PO TABS
25 MG | Freq: Once | ORAL | Status: AC
Start: 2017-05-17 — End: 2017-05-17
  Administered 2017-05-17: 17:00:00 25 mg via ORAL

## 2017-05-17 MED FILL — HEPARIN LOCK FLUSH 100 UNIT/ML IV SOLN: 100 UNIT/ML | INTRAVENOUS | Qty: 5

## 2017-05-17 MED FILL — BANOPHEN 25 MG PO TABS: 25 MG | ORAL | Qty: 1

## 2017-05-17 NOTE — Plan of Care (Signed)
Problem: KNOWLEDGE DEFICIT  Goal: Patient/S.O. demonstrates understanding of disease process, treatment plan, medications, and discharge instructions.  Outcome: Met This Shift      Problem: DISCHARGE BARRIERS  Goal: Patient's continuum of care needs are met  Outcome: Met This Shift

## 2017-05-17 NOTE — Progress Notes (Signed)
Patient's hemoglobin this AM: 7.2 g/dL.  Patient's platelet count this AM: 13.0 k/uL.  Pt seen and assessed at [facility].  Seen at [place] today for platelet transfusion per standing orders for above lab values.  Blood products transfused per [facility] policy.  Pt tolerated transfusion well.      During post platelet flush, patient reported hives and itching.  A small hive was identified on his neck, and in the middle of his back.  He reported generalized itching on his back, and itching eyes.  25mg  PO benedryl was administered per verbal orders from Dr. Hunt Oris.  After observation for @ 45 minutes, patient reported improvement in itching, hive on back was resolved, and hive on neck was unchanged.  His eyes no longer looked red.  The patient felt comfortable being discharged.  Patient and wife were educated to call [facility] if they experience any more itching or hives once they get home, or if they experience trouble-breathing or fever.  The patient was educated that he may require another dose of Benedryl later in the day, but to call [facility] regardless if reaction symptoms continue.    Dr. Hunt Oris did not want Benedryl added as a standing pre-med for future platelet transfusions at this time.      Pt verbalizes understanding of discharge instructions.  Discharged ambulatory to home with wife.

## 2017-05-20 ENCOUNTER — Inpatient Hospital Stay: Admit: 2017-05-20 | Payer: BLUE CROSS/BLUE SHIELD | Primary: Hematology & Oncology

## 2017-05-20 DIAGNOSIS — C9002 Multiple myeloma in relapse: Secondary | ICD-10-CM

## 2017-05-20 LAB — PREPARE RBC (CROSSMATCH): Dispense Status Blood Bank: TRANSFUSED

## 2017-05-20 LAB — PREPARE PLATELETS: Dispense Status Blood Bank: TRANSFUSED

## 2017-05-20 LAB — TYPE AND SCREEN
ABO/Rh: O POS
Antibody Screen: NEGATIVE

## 2017-05-20 MED ORDER — HEPARIN SOD (PORK) LOCK FLUSH 100 UNIT/ML IV SOLN
100 UNIT/ML | INTRAVENOUS | Status: DC | PRN
Start: 2017-05-20 — End: 2017-05-21
  Administered 2017-05-20: 20:00:00 500 [IU]

## 2017-05-20 MED FILL — HEPARIN LOCK FLUSH 100 UNIT/ML IV SOLN: 100 UNIT/ML | INTRAVENOUS | Qty: 5

## 2017-05-20 NOTE — Plan of Care (Signed)
Problem: KNOWLEDGE DEFICIT  Goal: Patient/S.O. demonstrates understanding of disease process, treatment plan, medications, and discharge instructions.  Outcome: Met This Shift  Patient's hemoglobin this AM:6.1  Patient's platelet count this AM: 16  Pt seen and assessed at [facility].  Seen at [place] today for one unit of PRBC and one bag of platelets per standing orders for above lab values.  Blood products transfused per [facility] policy.  Pt tolerated transfusion well and without incident.  Pt verbalizes understanding of discharge instructions.  Discharged ambulatory to home with self.      Problem: Falls - Risk of:  Goal: Will remain free from falls  Will remain free from falls    Outcome: Met This Shift  Pt is a Low fall risk.   Explained fall risk precautions to pt  and rationale behind their use to keep the patient safe. Belongings are in reach. Pt encouraged to notify staff for any and all assistance. Staff present in tx suite throughout entirety of pts treatment to monitor and protect from falls.  Assistance provided when ambulating to restroom utilizing Stay With Me.    Goal: Absence of physical injury  Absence of physical injury    Outcome: Met This Shift  Pt is a Med fall risk.   Explained fall risk precautions to pt  and rationale behind their use to keep the patient safe. Belongings are in reach. Pt encouraged to notify staff for any and all assistance. Staff present in tx suite throughout entirety of pts treatment to monitor and protect from falls.  Assistance provided when ambulating to restroom utilizing Stay With Me.

## 2017-05-20 NOTE — Progress Notes (Signed)
Patient here for blood and platelets for counts of 6.1 and 16 respectively.

## 2017-05-24 LAB — CULTURE, FUNGUS, BLOOD
Culture, Fungus Blood: NO GROWTH
Culture, Fungus Blood: NO GROWTH

## 2017-05-25 ENCOUNTER — Inpatient Hospital Stay: Admit: 2017-05-25 | Payer: BLUE CROSS/BLUE SHIELD | Primary: Hematology & Oncology

## 2017-05-25 ENCOUNTER — Inpatient Hospital Stay
Admit: 2017-05-25 | Discharge: 2017-05-29 | Disposition: A | Discharge status: Home / Self Care | Payer: BLUE CROSS/BLUE SHIELD | Source: Ambulatory Visit | Attending: Hematology | Admitting: Hematology

## 2017-05-25 DIAGNOSIS — Z5111 Encounter for antineoplastic chemotherapy: Secondary | ICD-10-CM

## 2017-05-25 LAB — CBC WITH AUTO DIFFERENTIAL
Basophils %: 0.2 %
Basophils Absolute: 0 10*3/uL (ref 0.0–0.2)
Eosinophils %: 0.1 %
Eosinophils Absolute: 0 10*3/uL (ref 0.0–0.6)
Hematocrit: 18.8 % — CL (ref 40.5–52.5)
Hemoglobin: 6.8 g/dL — CL (ref 13.5–17.5)
Lymphocytes %: 20.2 %
Lymphocytes Absolute: 0.6 10*3/uL — ABNORMAL LOW (ref 1.0–5.1)
MCH: 30.6 pg (ref 26.0–34.0)
MCHC: 36.2 g/dL — ABNORMAL HIGH (ref 31.0–36.0)
MCV: 84.5 fL (ref 80.0–100.0)
MPV: 8.1 fL (ref 5.0–10.5)
Monocytes %: 24.1 %
Monocytes Absolute: 0.7 10*3/uL (ref 0.0–1.3)
Neutrophils %: 55.4 %
Neutrophils Absolute: 1.7 10*3/uL (ref 1.7–7.7)
Platelets: 28 10*3/uL — ABNORMAL LOW (ref 135–450)
RBC: 2.23 M/uL — ABNORMAL LOW (ref 4.20–5.90)
RDW: 14.5 % (ref 12.4–15.4)
WBC: 3.1 10*3/uL — ABNORMAL LOW (ref 4.0–11.0)

## 2017-05-25 LAB — PHOSPHORUS: Phosphorus: 4.3 mg/dL (ref 2.5–4.9)

## 2017-05-25 LAB — LACTATE DEHYDROGENASE: LD: 142 U/L (ref 100–190)

## 2017-05-25 LAB — URINALYSIS
Bilirubin Urine: NEGATIVE
Blood, Urine: NEGATIVE
Glucose, Ur: NEGATIVE mg/dL
Ketones, Urine: NEGATIVE mg/dL
Leukocyte Esterase, Urine: NEGATIVE
Nitrite, Urine: NEGATIVE
Protein, UA: NEGATIVE mg/dL
Specific Gravity, UA: 1.01 (ref 1.005–1.030)
Urobilinogen, Urine: 0.2 E.U./dL (ref <2.0)
pH, UA: 6.5 (ref 5.0–8.0)

## 2017-05-25 LAB — HEPATIC FUNCTION PANEL
ALT: 19 U/L (ref 10–40)
AST: 15 U/L (ref 15–37)
Albumin: 3.8 g/dL (ref 3.4–5.0)
Alkaline Phosphatase: 262 U/L — ABNORMAL HIGH (ref 40–129)
Bilirubin, Direct: <0.2 mg/dL (ref 0.0–0.3)
Total Bilirubin: 0.5 mg/dL (ref 0.0–1.0)
Total Protein: 6.8 g/dL (ref 6.4–8.2)

## 2017-05-25 LAB — PREPARE RBC (CROSSMATCH): Dispense Status Blood Bank: TRANSFUSED

## 2017-05-25 LAB — MAGNESIUM: Magnesium: 1.9 mg/dL (ref 1.80–2.40)

## 2017-05-25 LAB — PROTIME-INR
INR: 1.11 (ref 0.86–1.14)
Protime: 12.6 s (ref 9.8–13.0)

## 2017-05-25 LAB — BASIC METABOLIC PANEL
Anion Gap: 11 (ref 3–16)
BUN: 18 mg/dL (ref 7–20)
CO2: 24 mmol/L (ref 21–32)
Calcium: 8.9 mg/dL (ref 8.3–10.6)
Chloride: 103 mmol/L (ref 99–110)
Creatinine: 1 mg/dL (ref 0.9–1.3)
GFR African American: >60 (ref >60)
GFR Non-African American: >60 (ref >60)
Glucose: 110 mg/dL — ABNORMAL HIGH (ref 70–99)
Potassium: 4.5 mmol/L (ref 3.5–5.1)
Sodium: 138 mmol/L (ref 136–145)

## 2017-05-25 LAB — APTT: aPTT: 33 s (ref 26.0–36.0)

## 2017-05-25 LAB — TYPE AND SCREEN
ABO/Rh: O POS
Antibody Screen: NEGATIVE

## 2017-05-25 LAB — URIC ACID: Uric Acid, Serum: 2.7 mg/dL — ABNORMAL LOW (ref 3.5–7.2)

## 2017-05-25 MED ORDER — MAGNESIUM SULFATE 4000 MG/100 ML IVPB PREMIX
4 GM/100ML | INTRAVENOUS | Status: DC | PRN
Start: 2017-05-25 — End: 2017-05-29

## 2017-05-25 MED ORDER — SODIUM CHLORIDE 0.9 % IV SOLN
0.9 % | INTRAVENOUS | Status: DC | PRN
Start: 2017-05-25 — End: 2017-05-29

## 2017-05-25 MED ORDER — LORAZEPAM 2 MG/ML IJ SOLN
2 MG/ML | INTRAMUSCULAR | Status: DC | PRN
Start: 2017-05-25 — End: 2017-05-29

## 2017-05-25 MED ORDER — POTASSIUM CHLORIDE 20 MEQ/50ML IV SOLN
20 MEQ/50ML | INTRAVENOUS | Status: DC | PRN
Start: 2017-05-25 — End: 2017-05-29

## 2017-05-25 MED ORDER — PROCHLORPERAZINE EDISYLATE 5 MG/ML IJ SOLN
5 MG/ML | INTRAMUSCULAR | Status: DC | PRN
Start: 2017-05-25 — End: 2017-05-29

## 2017-05-25 MED ORDER — FLUTICASONE-SALMETEROL 100-50 MCG/DOSE IN AEPB
100-50 MCG/DOSE | Freq: Two times a day (BID) | RESPIRATORY_TRACT | Status: DC
Start: 2017-05-25 — End: 2017-05-25

## 2017-05-25 MED ORDER — GABAPENTIN 300 MG PO CAPS
300 MG | Freq: Three times a day (TID) | ORAL | Status: DC
Start: 2017-05-25 — End: 2017-05-29
  Administered 2017-05-25 – 2017-05-29 (×13): 300 mg via ORAL

## 2017-05-25 MED ORDER — SERTRALINE HCL 50 MG PO TABS
50 MG | Freq: Every day | ORAL | Status: DC
Start: 2017-05-25 — End: 2017-05-29
  Administered 2017-05-26 – 2017-05-29 (×4): 50 mg via ORAL

## 2017-05-25 MED ORDER — ALTEPLASE 2 MG IJ SOLR
2 MG | INTRAMUSCULAR | Status: DC | PRN
Start: 2017-05-25 — End: 2017-05-29

## 2017-05-25 MED ORDER — TRAZODONE HCL 50 MG PO TABS
50 MG | Freq: Every evening | ORAL | Status: DC
Start: 2017-05-25 — End: 2017-05-29
  Administered 2017-05-26 – 2017-05-29 (×4): 50 mg via ORAL

## 2017-05-25 MED ORDER — NORMAL SALINE FLUSH 0.9 % IV SOLN
0.9 % | Freq: Two times a day (BID) | INTRAVENOUS | Status: DC
Start: 2017-05-25 — End: 2017-05-29
  Administered 2017-05-25 – 2017-05-29 (×8): 10 mL via INTRAVENOUS

## 2017-05-25 MED ORDER — ALBUTEROL SULFATE (2.5 MG/3ML) 0.083% IN NEBU
Freq: Four times a day (QID) | RESPIRATORY_TRACT | Status: DC
Start: 2017-05-25 — End: 2017-05-25

## 2017-05-25 MED ORDER — SODIUM CHLORIDE 0.9 % IV BOLUS
0.9 % | Freq: Once | INTRAVENOUS | Status: AC
Start: 2017-05-25 — End: 2017-05-25
  Administered 2017-05-25: 18:00:00 250 mL via INTRAVENOUS

## 2017-05-25 MED ORDER — SALINE MOUTHWASH
Freq: Four times a day (QID) | Status: DC
Start: 2017-05-25 — End: 2017-05-29
  Administered 2017-05-26 – 2017-05-27 (×5): 15 mL via ORAL
  Administered 2017-05-28 (×2): via ORAL
  Administered 2017-05-28 – 2017-05-29 (×3): 15 mL via ORAL

## 2017-05-25 MED ORDER — ALBUTEROL SULFATE (2.5 MG/3ML) 0.083% IN NEBU
Freq: Four times a day (QID) | RESPIRATORY_TRACT | Status: DC
Start: 2017-05-25 — End: 2017-05-29
  Administered 2017-05-25 – 2017-05-28 (×6): 2.5 mg via RESPIRATORY_TRACT

## 2017-05-25 MED ORDER — MAGNESIUM HYDROXIDE 400 MG/5ML PO SUSP
400 MG/5ML | Freq: Every day | ORAL | Status: DC | PRN
Start: 2017-05-25 — End: 2017-05-29

## 2017-05-25 MED ORDER — PROCHLORPERAZINE MALEATE 10 MG PO TABS
10 MG | ORAL | Status: DC | PRN
Start: 2017-05-25 — End: 2017-05-29

## 2017-05-25 MED ORDER — DEXAMETHASONE 4 MG PO TABS
4 MG | ORAL | Status: AC
Start: 2017-05-25 — End: 2017-05-28
  Administered 2017-05-25 – 2017-05-28 (×4): 40 mg via ORAL

## 2017-05-25 MED ORDER — ONDANSETRON HCL 8 MG PO TABS
8 MG | ORAL | Status: AC
Start: 2017-05-25 — End: 2017-05-29
  Administered 2017-05-25 – 2017-05-29 (×5): 24 mg via ORAL

## 2017-05-25 MED ORDER — NORMAL SALINE FLUSH 0.9 % IV SOLN
0.9 % | INTRAVENOUS | Status: DC | PRN
Start: 2017-05-25 — End: 2017-05-29

## 2017-05-25 MED ORDER — SALINE MOUTHWASH
Status: DC | PRN
Start: 2017-05-25 — End: 2017-05-29

## 2017-05-25 MED ORDER — PANTOPRAZOLE SODIUM 40 MG PO TBEC
40 MG | Freq: Every day | ORAL | Status: DC
Start: 2017-05-25 — End: 2017-05-29
  Administered 2017-05-26 – 2017-05-29 (×4): 40 mg via ORAL

## 2017-05-25 MED ORDER — SODIUM CHLORIDE 0.9 % IV SOLN
0.9 % | INTRAVENOUS | Status: AC
Start: 2017-05-25 — End: 2017-05-29
  Administered 2017-05-25 – 2017-05-28 (×4): via INTRAVENOUS

## 2017-05-25 MED ORDER — LORAZEPAM 0.5 MG PO TABS
0.5 MG | ORAL | Status: DC | PRN
Start: 2017-05-25 — End: 2017-05-29
  Administered 2017-05-25 – 2017-05-29 (×7): 0.5 mg via ORAL

## 2017-05-25 MED ORDER — FUROSEMIDE 10 MG/ML IJ SOLN
10 MG/ML | Freq: Two times a day (BID) | INTRAMUSCULAR | Status: DC
Start: 2017-05-25 — End: 2017-05-29
  Administered 2017-05-26 – 2017-05-28 (×3): 40 mg via INTRAVENOUS

## 2017-05-25 MED ORDER — VALACYCLOVIR HCL 500 MG PO TABS
500 MG | Freq: Two times a day (BID) | ORAL | Status: DC
Start: 2017-05-25 — End: 2017-05-29
  Administered 2017-05-26 – 2017-05-29 (×8): 500 mg via ORAL

## 2017-05-25 MED ORDER — SODIUM CHLORIDE 0.9 % IV SOLN
0.9 % | INTRAVENOUS | Status: DC
Start: 2017-05-25 — End: 2017-05-29
  Administered 2017-05-25 – 2017-05-29 (×8): 1000 mL via INTRAVENOUS

## 2017-05-25 MED ORDER — BUDESONIDE 0.5 MG/2ML IN SUSP
0.5 MG/2ML | Freq: Two times a day (BID) | RESPIRATORY_TRACT | Status: DC
Start: 2017-05-25 — End: 2017-05-29
  Administered 2017-05-26 – 2017-05-28 (×5): 500 mg via RESPIRATORY_TRACT

## 2017-05-25 MED FILL — ONDANSETRON HCL 8 MG PO TABS: 8 MG | ORAL | Qty: 3

## 2017-05-25 MED FILL — DEXAMETHASONE 4 MG PO TABS: 4 MG | ORAL | Qty: 10

## 2017-05-25 MED FILL — LORAZEPAM 0.5 MG PO TABS: 0.5 MG | ORAL | Qty: 1

## 2017-05-25 NOTE — Progress Notes (Signed)
RESPIRATORY THERAPY ASSESSMENT    Name:  David Watkins Record Number:  [id number]  Age: 61 y.o.   Gender: male  DOB: 1956/02/27  Today's Date:  05/25/2017  Room:  3524/3524-01    Assessment     Is the patient being admitted for a COPD or Asthma exacerbation?  No   (If yes the patient will be seen every 4 hours for the first 24 hours and then reassessed)    Patient Admission Diagnosis      Allergies  Allergies   Allergen Reactions   . Lenalidomide Rash   . Pomalidomide Hives   . Ceclor [Cefaclor] Hives       Minimum Predicted Vital Capacity:     6073          Actual Vital Capacity:      2500          Pulmonary History:No history  Home Oxygen Therapy:  room air  Home Respiratory Therapy:Salmeterol/Fluticasone Propionate   Current Respiratory Therapy:  Albuterol HHN Q 6 w/a.  Pulmicort HHN BID.          Respiratory Severity Index(RSI)   Patients with orders for inhalation medications, oxygen, or any therapeutic treatment modality will be placed on Respiratory Protocol.  They will be assessed with the first treatment and at least every 72 hours thereafter.  The following severity scale will be used to determine frequency of treatment intervention.    Smoking History: Pulmonary Disease or Smoking History, Greater than 15 pack year = 2    Social History  Social History   Substance Use Topics   . Smoking status: Former Smoker     Quit date: 11/25/2012   . Smokeless tobacco: Never Used   . Alcohol use No       Recent Surgical History: None = 0  Past Surgical History  Past Surgical History:   Procedure Laterality Date   . BONE MARROW BIOPSY     . BONE MARROW TRANSPLANT     . OTHER SURGICAL HISTORY Left 08/17/2016    trifusion cath placement   . PRE-MALIGNANT / BENIGN SKIN LESION EXCISION Left 03/29/2017    EXCISE LESION LEFT EXTERNAL EAR WITH FROZEN SECTION, FULL THICKNESS SKIN GRAFT       Level of Consciousness: Alert, Oriented, and Cooperative = 0    Level of Activity: Walking unassisted = 0    Respiratory Pattern:  Regular Pattern; RR 8-20 = 0    Breath Sounds: Clear = 0    Sputum   ,  , Sputum How Obtained: None  Cough: Strong, spontaneous, non-productive = 0    Vital Signs   BP 113/72   Pulse 102   Temp 98.2 F (36.8 C) (Oral)   Resp 18   SpO2 100%   SPO2 (COPD values may differ): Greater than or equal to 92% on room air = 0    Peak Flow (asthma only): not applicable = 0    RSI: 0-4 = See once and convert to home regimen or discontinue        Plan       Goals: medication delivery    Patient/caregiver was educated on the proper method of use for Respiratory Care Devices:  Yes      Level of patient/caregiver understanding able to:   [x]  Verbalize understanding   [x]  Demonstrate understanding       []  Teach back        []  Needs reinforcement       []   No available caregiver               []   Other:     Response to education:  Very Good     Is patient being placed on Home Treatment Regimen?  Yes     Does the patient have everything they need prior to discharge?  NA     Comments: Albuterol and Pulmicort as per home regimen.    Plan of Care: Albuterol HHN QID and prn.  Pulmicort HHN BID.    Electronically signed by Frederico Hamman, RCP on 05/25/2017 at 1:24 PM    Respiratory Protocol Guidelines     1. Assessment and treatment by Respiratory Therapy will be initiated for medication and therapeutic interventions upon initiation of aerosolized medication.  2. Physician will be contacted for respiratory rate (RR) greater than 35 breaths per minute. Therapy will be held for heart rate (HR) greater than 140 beats per minute, pending direction from physician.  3. Bronchodilators will be administered via Metered Dose Inhaler (MDI) with spacer when the following criteria are met:  a. Alert and cooperative     b. HR < 140 bpm  c. RR < 30 bpm                d. Can demonstrate a 2-3 second inspiratory hold  4. Bronchodilators will be administered via Hand Held Nebulizer [facility]) to patients when ANY of the following criteria are  met  a. Incognizant or uncooperative          b. Patients treated with HHN at Home        c. Unable to demonstrate proper use of MDI with spacer     d. RR > 30 bpm   5. Bronchodilators will be delivered via Metered Dose Inhaler (MDI), HHN, Aerogen to intubated patients on mechanical ventilation.  6. Inhalation medication orders will be delivered and/or substituted as outlined below.    Aerosolized Medications Ordering and Administration Guidelines:    1. All Medications will be ordered by a physician, and their frequency and/or modality will be adjusted as defined by the patients Respiratory Severity Index (RSI) score.  2. If the patient does not have documented COPD, consider discontinuing anticholinergics when RSI is less than 9.  3. If the bronchospasm worsens (increased RSI), then the bronchodilator frequency can be increased to a maximum of every 4 hours.  If greater than every 4 hours is required, the physician will be contacted.  4. If the bronchospasm improves, the frequency of the bronchodilator can be decreased, based on the patient's RSI, but not less than home treatment regimen frequency.  5. Bronchodilator(s) will be discontinued if patient has a RSI less than 9 and has received no scheduled or as needed treatment for 72  Hrs.    Patients Ordered on a Mucolytic Agent:    1. Must always be administered with a bronchodilator.    2. Discontinue if patient experiences worsened bronchospasm, or secretions have lessened to the point that the patient is able to clear them with a cough.    Anti-inflammatory and Combination Medications:    1. If the patient lacks prior history of lung disease, is not using inhaled anti-inflammatory medication at home, and lacks wheezing by examination or by history for at least 24 hours, contact physician for possible discontinuation.

## 2017-05-25 NOTE — Progress Notes (Signed)
Original chemotherapy orders reviewed and acknowledged. Appropriateness of chemotherapy treatment regimen DCEP for diagnosis of MM was verified.  Patient educated on chemotherapy regimen.  Acknowledgement of informed consent for chemotherapy obtained.      Estimated body surface area is 1.99 meters squared as calculated from the following:    Height as of 04/30/17: 5\' 9"  (1.753 m).    Weight as of this encounter: 179 lb (81.2 kg). verified.  Appropriate dosing calculations of chemotherapy based on above height, weight, and BSA verified.        Administration: Chemotherapy drug Cisplatin, Etoposide, and cyclophosphamide independently verified with Francee Gentile, RN prior to administration.  Acknowledgement of informed consent for chemotherapy administration verified.  Original order, appropriateness of regimen, drug supplied, height, weight, BSA, dose calculations, expiration dates/times, drug appearance, and two patient identifiers were verified by both RNs.  Drug checked for vesicant/irritant status and for risk of hypersensitivity.  Most recent laboratory values and allergies, were reviewed.  Positive, brisk blood return via CVC was confirmed prior to administration. Chest x-ray for correct line placement reviewed. Earl Lagos and Ricki Dews RN verified correct rate of chemotherapy and maintenance IV fluids.  Patient was educated on chemotherapy regimen prior to administration including indication for treatment related to disease & side effects of chemotherapy drug.  Patient verbalizes understanding of all instructions.    Completion of Chemotherapy: Monitoring during infusion done per policy, see Flowsheets.  Blood return verified before, during, and after infusion per policy; no signs of extravasation.  Pt tolerating chemotherapy well and without incident.  Chemotherapy infusion end time on the [facility].  Will continue to monitor.

## 2017-05-25 NOTE — Progress Notes (Signed)
Patient admitted to [facility] via direct admit from home for diagnosis of MM.  Here to receive chemotherapy regimen DCEP. Patient oriented to patient room including call light and bed controls.  Admission assessment completed - see admission flowsheet documentation.  Patient is a medium fall risk.  Safety measures instituted per policy.    Patient  oriented to unit policies and procedures including: pain management practices, unit safety precautions, family rapid response, q4h vital signs and assessments, daily 4am lab draws, weekly chest x-rays, weekly VRE rectal swabs for surveillance, daily chlorhexidine bathing, standing transfusion orders, and routine central line care.  Also discussed use of call light and how to get in touch with nursing staff.  Stressed the importance of calling out immediately for any changes in condition including but not limited to: pain, chills, fever, nausea, vomiting, diarrhea, chest pain, sob/doe, assistance with toileting, bleeding, or any other symptoms that are out of the ordinary for the patient.  Patient verbalizes understanding of all instructions and will call for assistance as needed.

## 2017-05-25 NOTE — Plan of Care (Signed)
Problem: Falls - Risk of:  Goal: Will remain free from falls  Will remain free from falls   Outcome: Ongoing  Orthostatic vital signs obtained at start of shift - see flowsheet for details.  Pt meets criteria for orthostasis.  Pt is a Med fall risk. See Leamon Arnt Fall Score and ABCDS Injury Risk assessments.   - Screening for Orthostasis AND not a High Falls Risk per MORSE/ABCDS: Pt bed is in low position, side rails up, call light and belongings are in reach.  Fall risk light is on outside pts room.  Pt encouraged to call for assistance as needed. Will continue with hourly rounds for PO intake, pain needs, toileting and repositioning as needed.     Problem: Bleeding:  Goal: Will show no signs and symptoms of excessive bleeding  Will show no signs and symptoms of excessive bleeding   Outcome: Ongoing  Patient's hemoglobin this AM:   Recent Labs      05/25/17   1035   HGB  6.8*     Patient's platelet count this AM:   Recent Labs      05/25/17   1035   PLT  28*    Thrombocytopenia Precautions in place.  Patient showing no signs or symptoms of active bleeding.  Patient transfused blood products per orders - see flowsheet.  Patient verbalizes understanding of all instructions. Will continue to assess and implement POC. Call light within reach and hourly rounding in place.     Problem: Venous Thromboembolism:  Goal: Will show no signs or symptoms of venous thromboembolism  Will show no signs or symptoms of venous thromboembolism   Outcome: Ongoing  - DVT Prophylaxis: Platelets <50,000 cells/dL - prophylactic lovenox on hold and mechanical prophylaxis with bilateral SCDs while in bed in place.  Contraindications to pharmacologic prophylaxis: Thrombocytopenia  Contraindications to mechanical prophylaxis: None    Problem: Discharge Planning:  Goal: Discharged to appropriate level of care  Discharged to appropriate level of care   Outcome: Ongoing  Pt is aware of current plan of care.     Problem: Infection - Central Venous  Catheter-Associated Bloodstream Infection:  Goal: Will show no infection signs and symptoms  Will show no infection signs and symptoms   Outcome: Ongoing  CVC site remains free of signs/symptoms of infection. No drainage, edema, erythema, pain, or warmth noted at site. Dressing changes continue per protocol and on an as needed basis - see flowsheet.     Compliant with [facility] Bath Protocol:  Performed CHG bath today per [facility]CC protocol utilizing CHG solution in the shower.  CVC site cleansed with CHG wipe over dressing, skin surrounding dressing, and first 6" of IV tubing.  Pt tolerated well.  Continued to encourage daily CHG bathing per [facility] protocol.      Problem: PROTECTIVE PRECAUTIONS  Goal: Patient will remain free of nosocomial Infections  Outcome: Ongoing  Pt remains in protective precautions.  Pt educated on wearing mask when in hallways. Pt, staff, and visitors adhering to handwashing guidelines. Pt educated to shower or bathe daily with chlorhexidine and linens changed daily per protocol. Pt verbalizes understanding of low microbial diet. Will continue to monitor.

## 2017-05-25 NOTE — Care Coordination-Inpatient (Signed)
Type of Admission  Multiple Myeloma IgG  Course #2 Day #2 [facility]        Central venous catheter  Right Basilic Double Lumen PICC( 04/26/17, Interventional Radiology)        Plan  Proceed with Course #2 of DCEP        Update  05/25/17:  Planned admission for 2nd course of DCEP. States he does not want Neulasta OBI after this course, "I don't like somehting sticking our of my arm", will inform Dr. Hunt Oris          Education  05/25/17:  Reviewed treatment calendar with patient        Discharge  Saturday or Sunday @ completion of course        Pending

## 2017-05-25 NOTE — H&P (Signed)
[place] History and Physical       Attending Physician: Harlene Salts, MD    Primary Care: Eulis Canner, MD       Referring MD: Eul[place] [address]    Name: David SCHIRMER DOB:  Jan 26, 1956  MRN:  [id number]    Admission: 05/25/2017      Date: 05/25/2017    Reason for Admission: DCEP Cycle #2    History of Present Illness:    David Watkins is a 61 yo gentleman with a PMH of  HTN, HLD and IgG Kappa Multiple Myeloma.  His oncologic history dates back to July 2017 when he was diagnosed with IgG Kappa Multiple Myeloma. He was started on a combination of dexamethasone + radiation tx (03/20/16) to T5-7, T10-L3, Right Scapula.  He received 3000 cGy with Dr. Maceo Pro &Dr. Jamas Lav (03/20/16-04/01/16). He then started systemic RVD on 04/02/16 w/ monthly zometa. He unfortunately developed rash with Revlimid and was switched to PVD. He tolerated this for 3 cycles until he developed SOB and rash which was attributed to Pomalyst. The Pomalyst was stopped (06/17/16) and he received 1 more cycle of therapy with Velcade &Dex. He was then admitted for high-dose melphalan chemotherapy followed by infusion of 2.36 x10^6 cd34cells/kg PBSCs on 08/28/16. He experienced expected cytopenias, fatigue, nausea, & diarrhea but otherwise recovered well from transplant.     Since transplant he was followed expectantly on maintenance Revlimid 10 mg daily, following desensitization ro Revlimid in 12/2016. He was feeling well until early August when he developed a dry cough.  He then presented to [facility] on 04/23[facility]17 w/ [facility] office 04/23/17 with progressive anorexia, nonproductive cough, 17 pound wt loss, progressive weakness, DOE, & severe thrombocytopenia all concerning for disease relapse.  He was admitted (04/23/17) for evaluation of cough as well as evaluation of thrombocytopenia. 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 Also w/emphysematous changes.  He was empirically started on Merrem.  He also underwent full myeloma work-up w/ BM bx/asp (04/23/17) that showed single continuous sheet of plasma cells.  The remainder of his myeloma work-up was consistent w/ relapsed disease and plasma cell leukemia.     He was treated with DCEP chemotherapy starting 04/24/17.  He developed TLS w/ hyperPhos and hypoCa.  He was aggressively hydrated and his AKI improved by discharge.  His appetite and pain improved by discharge (05/01/17).  His weakness and pain continues to improve as well.  He has continued regular follow up in [facility].  His repeat myeloma labs (05/20/17) show an excellent response to the first cycle of DCEP.  He remains anemia and thrombocytopenic, but will be admitted for cycle #2 of DECP today.  He feels well except for complaints of generalized weakness and right scapular and low back pain.  He is also depressed and worried about his future.           Past Surgical History:   Procedure Laterality Date   . BONE MARROW BIOPSY     . BONE MARROW TRANSPLANT     . OTHER SURGICAL HISTORY Left 08/17/2016    trifusion cath placement   . PRE-MALIGNANT / BENIGN SKIN LESION EXCISION Left 03/29/2017    EXCISE LESION LEFT EXTERNAL EAR WITH FROZEN SECTION, FULL THICKNESS SKIN GRAFT  Past Medical History:   Diagnosis Date   . Bone pain    . Hyperlipidemia    . Low back pain    . Multiple myeloma ([place])    . Neuropathy     chemo induced, feet       Prior to Admission medications    Medication Sig Start Date End Date Taking? Authorizing Provider   traZODone (DESYREL) 50 MG tablet Take 50 mg by mouth nightly   Yes Historical Provider, MD   sertraline (ZOLOFT) 50 MG tablet Take 50 mg by mouth daily   Yes Historical Provider, MD   fluconazole (DIFLUCAN) 200 MG tablet Take 1 tablet by mouth daily 05/01/17  Yes Jannette Spanner, MD   gabapentin (NEURONTIN) 300 MG capsule  Take 1 capsule by mouth 3 times daily for 30 days.. 05/01/17 05/31/17 Yes Jannette Spanner, MD   levofloxacin [facility]) 500 MG tablet Take 1 tablet by mouth nightly 05/01/17  Yes Jannette Spanner, MD   Fluticasone-Salmeterol (ADVAIR DISKUS IN) Inhale into the lungs   Yes Historical Provider, MD   atorvastatin (LIPITOR) 10 MG tablet Take 10 mg by mouth daily   Yes Historical Provider, MD   valACYclovir (VALTREX) 500 MG tablet Take 500 mg by mouth 2 times daily   Yes Historical Provider, MD   prochlorperazine (COMPAZINE) 10 MG tablet Take 1 tablet by mouth every 6 hours as needed (Nausea) 05/01/17   Jannette Spanner, MD       Allergies   Allergen Reactions   . Lenalidomide Rash   . Pomalidomide Hives   . Ceclor [Cefaclor] Hives       Family History   Problem Relation Age of Onset   . Heart Disease Mother    . Lung Cancer Father    . Heart Attack Brother         Social History     Social History   . Marital status: Married     Spouse name: N/A   . Number of children: N/A   . Years of education: N/A     Occupational History   . Not on file.     Social History Main Topics   . Smoking status: Former Smoker     Quit date: 11/25/2012   . Smokeless tobacco: Never Used   . Alcohol use No   . Drug use: No   . Sexual activity: Not on file     Other Topics Concern   . Not on file     Social History Narrative   . No narrative on file        ROS   Constitutional: Denies fever, sweats, weight loss.      Eyes: No visual changes or diplopia. No scleral icterus.   ENT: No Headaches, hearing loss or vertigo. No mouth sores or sore throat.   Cardiovascular: No chest pain, dyspnea on exertion, palpitations or loss of consciousness.    Respiratory: No cough or wheezing, no sputum production. No hemoptysis.     Gastrointestinal: No abdominal pain, appetite loss, blood in stools. No change in bowel habits.   Genitourinary: No dysuria, trouble voiding, or hematuria.   Musculoskeletal:  + generalized weakness w/ right scapular  and low back pain   Integumentary: No rash or pruritis.   Neurological: No headache, diplopia. No change in gait, balance, or coordination. No paresthesias.   Endocrine: No temperature intolerance. No excessive thirst, fluid intake, or urination.    Hematologic/Lymphatic: No abnormal bruising or  ecchymoses, blood clots or swollen lymph nodes.   Allergic/Immunologic: No nasal congestion or hives.     Physical Exam:     Vital Signs:  BP 113/72   Pulse 102   Temp 98.2 F (36.8 C) (Oral)   Resp 18   SpO2 100%     Weight:    Wt Readings from Last 3 Encounters:   05/01/17 182 lb (82.6 kg)   04/05/17 180 lb (81.6 kg)   03/29/17 189 lb 1.6 oz (85.8 kg)         General: Awake, alert and oriented.  HEENT: normocephalic, alopecia, PERRL, no scleral erythema or icterus, Oral mucosa moist and intact, throat clear  NECK: supple without palpable adenopathy  BACK: Straight w/ palpable lower spinal pain and mild palpable pain to right lower scapular area, negative CVAT  SKIN: warm dry and intact without lesions rashes or masses  CHEST: Crackles to right base, otherwise CTA & without use of accessory muscles  CV: Normal S1 S2, RRR, no MRG  ABD: NT ND normoactive BS, no palpable masses or hepatosplenomegaly  EXTREMITIES: without edema, denies calf tenderness  NEURO: CN II - XII grossly intact  CATHETER:  RUE DL PICC (04/26/17 - IR) - site CDI    Laboratory Data:   CBC:   Recent Labs      05/25/17   1035   WBC  3.1*   HGB  6.8*   HCT  18.8*   MCV  84.5   PLT  28*     BMP/Mag:  Recent Labs      05/25/17   1035   NA  138   K  4.5   CL  103   CO2  24   PHOS  4.3   BUN  18   CREATININE  1.0   MG  1.90     LIVP:   Recent Labs      05/25/17   1035   AST  15   ALT  19   BILIDIR  <0.2   BILITOT  0.5   ALKPHOS  262*     Coags:   Recent Labs      05/25/17   1035   PROTIME  12.6   INR  1.11   APTT  33.0     Uric Acid   Recent Labs      05/25/17   1035   LABURIC  2.7*        DIAGNOSTIC IMAGING:  1. CT Chest 04/23/17:  1. Redemonstration of  multiple lytic lesions throughout the skeleton compatible with patient's history of multiple myeloma.   2. 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 Stable scattered submillimeter pulmonary parenchymal nodules.   4. Marked centrilobular emphysematous changes with upper lobe predominance, stable.   5. Progression of splenomegaly       PATHOLOGY:  1. BM Bx/Asp 04/24/17  Bone marrow biopsy, clot section, aspirate smear, touch imprint,  peripheral blood smear and flow cytometry data:  Plasma cell leukemia.    There is a single continuous sheet of atypical plasma cells  (shown to be kappa monotypic via flow cytometry) effacing the bone  marrow. The cellularity is 100% and there is minimal evidence of  normal-appearing hematopoietic activity. Genetic studies are pending.  The morphologic features are most consistent with the plasmablastic  variant. There is a patchy 3+ increase in reticulin  fibers.    2. Myeloma Studies 04/24/17:  SPEP/SIFE: monoclonal IgG kappa by immunofixation (6.65 g/dl)  SFLCs:   Free Kappa Light Chains    637  Free Lambda Light Chain    20.85  Free Kappa/Lambda Ratio   30.55  Immunoglobulins: IgA 106, IgG 6379, IgM 29  B2: 16.5  UPEP/UIFE: Urine IFE shows a polyclonal increase in free kappa and/or   free lambda light chains. No monoclonal free light chains   (Bence Jones Protein) are detected. Urine IFE shows a   monoclonal IgG heavy chain with associated kappa light   chain.   UFLCs:   Free Urinary Kappa Light Chains     150.00 H   mg/dL   Free Urinary Lambda Light Chain      9.06 H   mg/dL    Free Urinary Kappa/Lambda Ratio      16.56 H   ratio     Myeloma Labs (05/20/17):  SFLC (05/20/17):  Kappa - 23.80  Lambda - 24.70  Ratio - 0.96    Immunoglobulins (05/20/17):  IgG - 1270  IgA - 117  IgM - 25    SPEP / SIFE (05/20/17):  M-spike:  0.8 g/dL  Serum  immunofixation electrophoresis reveals two M-spikes, one of which is recent.  Persistent is M2 in mid-to-slow gamma, a monoclonal IgG kappa. The recent M-spike, M1, is minor monoclonal IgG lambda in mid-gamma; M1 isnot visible by SPE, only by IFE.    Beta 2 Microglobulin (05/20/17);  2.19    PROBLEM LIST:      IgG Kappa Multiple myeloma  Peripheral neuropathy  Depression  Erectile dysfunction  Hyperlipidemia  Hypertension  Insomnia  Low back pain - h/o lytic lesions as well as cord compression at T6 and T11    TREATMENT:      1. Rad Tx to T5-7, T10-L3, Right Scapula - 3000 cGy - Dr. Jamas Lav 03/20/16-04/01/16  2. RVD x1 03/20/16 - discontinued d/t rash   3. Velcade/Pomalyst/Dex x3 cycles 04/23/16-06/17/16 - discontinued d/t reaction to pomalyst  4. Velcade/Dex x 1 cycle 06/26/16 (last dose of dexamethasone 07/22/16  5. High-dose melphalan followed by administration of PBSCs 2.36 x10^6 cd34cells/kg on 08/28/16  6. Maintenance Revlimid 55m daily (12/2016-04/23/17)  7. Dexamethasone 470mdaily (04/24/17)  8. DCEP   Cycle #1 - 04/26/17  Cycle #2 - 05/25/17    ASSESSMENT AND PLAN:     1. IgG kappa multiple myeloma / Plasma Cell Leukemia: [place] relapse of his disease in 03/2017 w/ progressive anorexia & weakness, 17 lb wt loss & severe thrombocytopenia   - Good response after 1 cycle of DCEP, see labs above. Monoclonal protein down to 0.8 g/dL from 6.6.5 g/dL    Plan:  second cycle of DCEP, then possibly transition to Dara/Pom/Dex. [place]    2. ID: Afebrile and w/out evidence of infection  - Cont Valtrex prophylaxis for 1 year post tx    3. Heme: Thrombocytopenia and anemia d/t chemotherapy, he is not pancytopenic  - Transfuse for Hgb < 7 and Platelets < 10K  - PRBC transfusion today  - Neulasta, refusing OBI at d/c     4. Metabolic: Stable renal fxn and e-lytes  - Start IVF w/ chemotherapy    - Replace Mg+ and K+ per protocol    5. Pulmonary:  No acute issues   - Chronic Bronchitis/COPD 7 pulmonary nodule.   -  Pulm Nodule: Stable on Repeat CT chest 04/23/17; cont to Monitor.   - Cont albuterol & pulmicort    6. GI/Nutrition: Appetite and oral intake is good   - Cont low microbial diet    - Dietary to follow as needed     7. 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  - Hold Lipitor while inpatient for chemotherapy and resume on d/c  - Echo (07/13/16)  - abnormal (paradoxical) septal motion is present with preserved LVEF 55-60%.    8. Anxiety/Depression: Ongoing and this has definitely increased w/ his relapsed disease. He has verbalized that he is depressed   - Cont Zoloft daily  - Cont Ativan PRN  - Psych to follow as needed     9. Insomnia:ongoing, has failed multiple sleep aids  - Cont Trazodone nightly     10. Peripheral Neuropathy: Ongoing  - Cont Gabapentin 300 mg TID     11. Bone Health: H/o spinal cord compression & diffuse lytic lesions t/o skeleton  - CT Chest (04/23/17) - multiple lytic lesions throughout the skeleton   - Cont Ca/Vit D & zometa   - S/p Zometa 05/24/17    12. Critical Illness Myopathy: Improving  - he plans of walking and cont his exercises while inpatient     13.  Acute Pain:  He c/o increased lower back pain and pain under right scapula. He is concerned about increased lesions       - DVT Prophylaxis: Platelets <50,000 cells/dL - prophylactic lovenox on hold and mechanical prophylaxis with bilateral SCDs while in bed in place.  Contraindications to pharmacologic prophylaxis: Thrombocytopenia  Contraindications to mechanical prophylaxis: None      - Disposition: Once chemotherapy completes.  Likely on Saturday     The patient was seen and examined by Dr. Hunt Oris. This admission history and physical has been discussed and agreed upon by Dr. Hunt Oris.    Wayland Salinas, APRN - CNP

## 2017-05-25 NOTE — Behavioral Health Treatment Team (Signed)
Psychology    Met with pt who is admitted for second cycle of chemo.  Pt has been followed by psychologist as outpatient.  He says he continues to feel significantly better able to cope on the Zoloft and his sleep has improved with Trazodone.  He admits he is still angry that he didn't get more remission and is anxious about his future.  He asked for regular visits and psychologist will see pt on routine basis through this episode of care.    Kelisha Dall, Psy.D., ABPP

## 2017-05-26 LAB — POCT GLUCOSE
POC Glucose: 157 mg/dl — ABNORMAL HIGH (ref 70–99)
POC Glucose: 203 mg/dl — ABNORMAL HIGH (ref 70–99)
POC Glucose: 132 mg/dl — ABNORMAL HIGH (ref 70–99)

## 2017-05-26 LAB — CBC WITH AUTO DIFFERENTIAL
Bands Relative: 1 % (ref 0–7)
Basophils %: 0 %
Basophils Absolute: 0 10*3/uL (ref 0.0–0.2)
Eosinophils %: 0 %
Eosinophils Absolute: 0 10*3/uL (ref 0.0–0.6)
Hematocrit: 19.6 % — CL (ref 40.5–52.5)
Hemoglobin: 6.8 g/dL — CL (ref 13.5–17.5)
Lymphocytes %: 6 %
Lymphocytes Absolute: 0.2 10*3/uL — ABNORMAL LOW (ref 1.0–5.1)
MCH: 29.9 pg (ref 26.0–34.0)
MCHC: 34.7 g/dL (ref 31.0–36.0)
MCV: 86.1 fL (ref 80.0–100.0)
MPV: 7.5 fL (ref 5.0–10.5)
Metamyelocytes Relative: 2 % — AB
Monocytes %: 2 %
Monocytes Absolute: 0.1 10*3/uL (ref 0.0–1.3)
Myelocyte Percent: 1 % — AB
Neutrophils %: 88 %
Neutrophils Absolute: 2.8 10*3/uL (ref 1.7–7.7)
PLATELET SLIDE REVIEW: DECREASED
Platelets: 23 10*3/uL — ABNORMAL LOW (ref 135–450)
RBC: 2.28 M/uL — ABNORMAL LOW (ref 4.20–5.90)
RDW: 14.1 % (ref 12.4–15.4)
WBC: 3 10*3/uL — ABNORMAL LOW (ref 4.0–11.0)

## 2017-05-26 LAB — BASIC METABOLIC PANEL
Anion Gap: 15 (ref 3–16)
BUN: 17 mg/dL (ref 7–20)
CO2: 18 mmol/L — ABNORMAL LOW (ref 21–32)
Calcium: 8.6 mg/dL (ref 8.3–10.6)
Chloride: 105 mmol/L (ref 99–110)
Creatinine: 0.8 mg/dL — ABNORMAL LOW (ref 0.9–1.3)
GFR African American: >60 (ref >60)
GFR Non-African American: >60 (ref >60)
Glucose: 244 mg/dL — ABNORMAL HIGH (ref 70–99)
Potassium: 4.4 mmol/L (ref 3.5–5.1)
Sodium: 138 mmol/L (ref 136–145)

## 2017-05-26 LAB — PHOSPHORUS: Phosphorus: 3 mg/dL (ref 2.5–4.9)

## 2017-05-26 LAB — HEPATIC FUNCTION PANEL
ALT: 23 U/L (ref 10–40)
AST: 21 U/L (ref 15–37)
Albumin: 3.8 g/dL (ref 3.4–5.0)
Alkaline Phosphatase: 246 U/L — ABNORMAL HIGH (ref 40–129)
Bilirubin, Direct: <0.2 mg/dL (ref 0.0–0.3)
Total Bilirubin: 0.4 mg/dL (ref 0.0–1.0)
Total Protein: 6.5 g/dL (ref 6.4–8.2)

## 2017-05-26 LAB — URIC ACID: Uric Acid, Serum: 3.1 mg/dL — ABNORMAL LOW (ref 3.5–7.2)

## 2017-05-26 LAB — PREPARE RBC (CROSSMATCH): Dispense Status Blood Bank: TRANSFUSED

## 2017-05-26 LAB — LACTATE DEHYDROGENASE: LD: 137 U/L (ref 100–190)

## 2017-05-26 MED ORDER — DEXTROSE 5 % IV SOLN
5 % | INTRAVENOUS | Status: DC | PRN
Start: 2017-05-26 — End: 2017-05-29

## 2017-05-26 MED ORDER — GLUCAGON HCL RDNA (DIAGNOSTIC) 1 MG IJ SOLR
1 MG | INTRAMUSCULAR | Status: DC | PRN
Start: 2017-05-26 — End: 2017-05-29

## 2017-05-26 MED ORDER — GLUCOSE 40 % PO GEL
40 % | ORAL | Status: DC | PRN
Start: 2017-05-26 — End: 2017-05-29

## 2017-05-26 MED ORDER — DEXTROSE 50 % IV SOLN
50 % | INTRAVENOUS | Status: DC | PRN
Start: 2017-05-26 — End: 2017-05-29

## 2017-05-26 MED ORDER — INSULIN LISPRO 100 UNIT/ML SC SOPN
100 UNIT/ML | Freq: Every evening | SUBCUTANEOUS | Status: DC
Start: 2017-05-26 — End: 2017-05-29
  Administered 2017-05-27 – 2017-05-29 (×3): 1 [IU] via SUBCUTANEOUS

## 2017-05-26 MED ORDER — SODIUM CHLORIDE 0.9 % IV BOLUS
0.9 % | Freq: Once | INTRAVENOUS | Status: AC
Start: 2017-05-26 — End: 2017-05-26
  Administered 2017-05-26: 10:00:00 250 mL via INTRAVENOUS

## 2017-05-26 MED ORDER — INSULIN LISPRO 100 UNIT/ML SC SOPN
100 UNIT/ML | Freq: Three times a day (TID) | SUBCUTANEOUS | Status: DC
Start: 2017-05-26 — End: 2017-05-29
  Administered 2017-05-26: 17:00:00 2 [IU] via SUBCUTANEOUS
  Administered 2017-05-26 – 2017-05-28 (×3): 1 [IU] via SUBCUTANEOUS

## 2017-05-26 MED ORDER — BLISTEX MEDICATED EX OINT
CUTANEOUS | Status: DC | PRN
Start: 2017-05-26 — End: 2017-05-29

## 2017-05-26 MED FILL — LORAZEPAM 0.5 MG PO TABS: 0.5 MG | ORAL | Qty: 1

## 2017-05-26 MED FILL — SERTRALINE HCL 50 MG PO TABS: 50 MG | ORAL | Qty: 1

## 2017-05-26 MED FILL — ALBUTEROL SULFATE (2.5 MG/3ML) 0.083% IN NEBU: RESPIRATORY_TRACT | Qty: 3

## 2017-05-26 MED FILL — FUROSEMIDE 10 MG/ML IJ SOLN: 10 MG/ML | INTRAMUSCULAR | Qty: 4

## 2017-05-26 MED FILL — ONDANSETRON HCL 8 MG PO TABS: 8 MG | ORAL | Qty: 3

## 2017-05-26 MED FILL — TRAZODONE HCL 50 MG PO TABS: 50 MG | ORAL | Qty: 1

## 2017-05-26 MED FILL — DEXAMETHASONE 4 MG PO TABS: 4 MG | ORAL | Qty: 10

## 2017-05-26 NOTE — Behavioral Health Treatment Team (Signed)
Pt attended a group therapy session in the family room. Pt was actively engaged and shared his cancer story including the things he has found most difficult. He was open and helpful when talking and giving advice to other patients who were earlier in their treatment.    Pt stayed after the group ended to chat with the therapist. Will continue to follow and support.    Marius Ditch, [place]

## 2017-05-26 NOTE — Progress Notes (Signed)
Chemotherapy drug DCEP independently verified with Jorge Mandril, RN prior to administration.  Acknowledgement of informed consent for chemotherapy administration verified.  Original order, appropriateness of regimen, drug supplied, height, weight, BSA, dose calculations, expiration dates/times, drug appearance, and two patient identifiers were verified by both RNs.  Drug checked for vesicant/irritant status and for risk of hypersensitivity.  Most recent laboratory values and allergies, were reviewed.  Positive, brisk blood return via CVC was confirmed prior to administration. Chest x-ray for correct line placement reviewed. Earl Lagos and [facility] RN verified correct rate of chemotherapy and maintenance IV fluids.  Patient was educated on chemotherapy regimen prior to administration including indication for treatment related to disease & side effects of chemotherapy drug.  Patient verbalizes understanding of all instructions.    Monitoring during infusion done per policy, see Flowsheets.  Blood return verified before, during, and after infusion per policy; no signs of extravasation.  Pt tolerating chemotherapy well and without incident.  Chemotherapy infusion end time on the [facility].  Will continue to monitor.

## 2017-05-26 NOTE — Progress Notes (Signed)
[facility] Progress Note    05/26/2017     David Watkins    MRN: [id number]    DOB: March 07, 1956    Referring MD: Eulis Canner, [name] [address]      SUBJECTIVE:  Patient is tolerating it well.    ECOG PS:  (1) Restricted in physically strenuous activity, ambulatory and able to do work of light nature    Isolation: None    Medications    Scheduled Meds:  . sodium chloride  250 mL Intravenous Once   . Saline Mouthwash  15 mL Swish & Spit 4x Daily AC & HS   . sodium chloride flush  10 mL Intravenous 2 times per day   . traZODone  50 mg Oral Nightly   . valACYclovir  500 mg Oral BID   . sertraline  50 mg Oral Daily   . pantoprazole  40 mg Oral QAM AC   . gabapentin  300 mg Oral TID   . budesonide  0.5 mg Nebulization BID   . ondansetron  24 mg Oral Q24H   . dexamethasone  40 mg Oral Q24H   . furosemide  40 mg Intravenous Q12H   . albuterol  2.5 mg Nebulization 4x daily     Continuous Infusions:  . sodium chloride     . sodium chloride 1,000 mL (05/25/17 2245)   . cyclophosphamide/etoposide/CISplatin chemo infusion 41.7 mL/hr at 05/25/17 1626     PRN Meds:.medicated lip ointment, sodium chloride, alteplase, magnesium hydroxide, magnesium sulfate, potassium chloride, Saline Mouthwash, sodium chloride flush, prochlorperazine **OR** prochlorperazine, LORazepam **OR** LORazepam    ROS   Constitutional: Denies fever, sweats, weight loss.     Eyes: No visual changes or diplopia. No scleral icterus.   ENT: No Headaches, hearing loss or vertigo. No mouth sores or sore throat.   Cardiovascular: No chest pain, dyspnea on exertion, palpitations or loss of consciousness.    Respiratory: No cough or wheezing, no sputum production. No hemoptysis.   Gastrointestinal: No abdominal pain, appetite loss, blood in stools. No change in bowel habits.   Genitourinary: No dysuria, trouble voiding, or hematuria.   Musculoskeletal:  No generalized weakness. No joint complaints.   Integumentary: No rash or  pruritis.   Neurological: No headache, diplopia. No change in gait, balance, or coordination. No paresthesias.   Endocrine: No temperature intolerance. No excessive thirst, fluid intake, or urination.    Hematologic/Lymphatic: No abnormal bruising or ecchymoses, blood clots or swollen lymph nodes.   Allergic/Immunologic: No nasal congestion or hives.     Physical Exam:     I&O:    Intake/Output Summary (Last 24 hours) at 05/26/17 0742  Last data filed at 05/26/17 0612   Gross per 24 hour   Intake          3168.03 ml   Output             2400 ml   Net           768.03 ml       Vital Signs:  BP 114/66   Pulse 90   Temp 97.8 F (36.6 C) (Oral)   Resp 16   Ht 5' 9"  (1.753 m)   Wt 179 lb (81.2 kg)   SpO2 97%   BMI 26.43 kg/m     Weight:    Wt Readings from Last 3 Encounters:   05/26/17 179 lb (81.2 kg)   05/01/17 182 lb (82.6 kg)  04/05/17 180 lb (81.6 kg)         General: Awake, alert and oriented.  HEENT: normocephalic, PERRL, no scleral erythema or icterus, Oral mucosa moist and intact, throat clear  NECK: supple without palpable adenopathy  BACK: Straight negative CVAT  SKIN: warm dry and intact without lesions rashes or masses  CHEST: CTA bilaterally without use of accessory muscles  CV: Normal S1 S2, RRR, no MRG  ABD: NT ND normoactive BS, no palpable masses or hepatosplenomegaly  EXTREMITIES: without edema, denies calf tenderness  NEURO: CN II - XII grossly intact  CATHETER: RUE DL PICC (04/26/17 - IR) - site CDI    Data    CBC: Recent Labs      05/25/17   1035  05/26/17   0255   WBC  3.1*  3.0*   HGB  6.8*  6.8*   HCT  18.8*  19.6*   MCV  84.5  86.1   PLT  28*  23*     BMP/Mag:  Recent Labs      05/25/17   1035  05/26/17   0255   NA  138  138   K  4.5  4.4   CL  103  105   CO2  24  18*   PHOS  4.3  3.0   BUN  18  17   CREATININE  1.0  0.8*   MG  1.90   --      LIVP:   Recent Labs      05/25/17   1035  05/26/17   0255   AST  15  21   ALT  19  23   BILIDIR  <0.2  <0.2   BILITOT  0.5  0.4   ALKPHOS   262*  246*     Coags:   Recent Labs      05/25/17   1035   PROTIME  12.6   INR  1.11   APTT  33.0     Uric Acid   Recent Labs      05/25/17   1035  05/26/17   0255   LABURIC  2.7*  3.1*     DIAGNOSTIC IMAGING:  1. CT Chest 04/23/17:  1. Redemonstration of multiple lytic lesions throughout the skeleton compatible with patient's history of multiple myeloma.   2. 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 Stable scattered submillimeter pulmonary parenchymal nodules.   4. Marked centrilobular emphysematous changes with upper lobe predominance, stable.   5. Progression of splenomegaly       PATHOLOGY:  1. BM Bx/Asp 04/24/17  Bone marrow biopsy, clot section, aspirate smear, touch imprint,  peripheral blood smear and flow cytometry data:  Plasma cell leukemia.    There is a single continuous sheet of atypical plasma cells  (shown to be kappa monotypic via flow cytometry) effacing the bone  marrow. The cellularity is 100% and there is minimal evidence of  normal-appearing hematopoietic activity. Genetic studies are pending.  The morphologic features are most consistent with the plasmablastic  variant. There is a patchy 3+ increase in reticulin fibers.    2. Myeloma Studies 04/24/17:  SPEP/SIFE: monoclonal IgG kappa by immunofixation(6.65 g/dl)  SFLCs:   Free Kappa Light Chains 637  Free Lambda Light Chain 20.85  Free Kappa/Lambda Ratio 30.55  Immunoglobulins: IgA 106, IgG 6379, IgM 29  B2: 16.5  UPEP/UIFE: Urine IFE shows a polyclonal increase in free kappa and/or   free lambda light chains. No monoclonal free light chains   (Bence Jones Protein) are detected. Urine IFE shows a   monoclonal IgG heavy chain with associated kappa light   chain.   UFLCs:   Free Urinary Kappa Light Chains 150.00 H mg/dL   Free Urinary Lambda Light Chain 9.06 H mg/dL   Free Urinary  Kappa/Lambda Ratio 16.56 H ratio     Myeloma Labs (05/20/17):  SFLC (05/20/17):  Kappa - 23.80  Lambda - 24.70  Ratio - 0.96    Immunoglobulins (05/20/17):  IgG - 1270  IgA - 117  IgM - 25    SPEP / SIFE (05/20/17):  M-spike:  0.8 g/dL  Serum immunofixation electrophoresis reveals two M-spikes, one of which is recent.  Persistent is M2 in mid-to-slow gamma, a monoclonal IgG kappa. The recent M-spike, M1, is minor monoclonal IgG lambda in mid-gamma; M1 isnot visible by SPE, only by IFE.    Beta 2 Microglobulin (05/20/17);  2.19      PROBLEM LIST:    IgG Kappa Multiple myeloma  Peripheral neuropathy  Depression  Erectile dysfunction  Hyperlipidemia  Hypertension  Insomnia  Low back pain - h/o lytic lesions as well as cord compression at T6 and T11      TREATMENT:    1. Rad Tx to T5-7, T10-L3, Right Scapula - 3000 cGy - Dr. Jamas Lav 03/20/16-04/01/16  2. RVD x1 03/20/16 - discontinued d/t rash   3. Velcade/Pomalyst/Dex x3 cycles 04/23/16-06/17/16 - discontinued d/t reaction to pomalyst  4. Velcade/Dex x 1 cycle 06/26/16 (last dose of dexamethasone 07/22/16  5. High-dose melphalan followed by administration of PBSCs 2.36 x10^6 cd34cells/kg on 08/28/16  6. Maintenance Revlimid 8m daily (12/2016-04/23/17)  7. Dexamethasone 493mdaily (04/24/17)  8. DCEP   Cycle #1 - 04/26/17  Cycle #2 - 05/25/17      ASSESSMENT AND PLAN:   1. IgG kappa multiple myeloma / Plasma Cell Leukemia: [place] relapse of his disease in 03/2017 w/ progressive anorexia & weakness, 17 lb wt loss & severe thrombocytopenia   - Good response after 1 cycle of DCEP, see labs above. Monoclonal protein down to 0.8 g/dL from 6.6.5 g/dL    Plan:  second cycle of DCEP, then possibly transition to Dara/Pom/Dex. [place]    2. ID: Afebrile and w/out evidence of infection  - Cont Valtrex prophylaxis for 1 year post tx    3. Heme: Thrombocytopenia and anemia d/t chemotherapy  -  Transfuse for Hgb < 7 and Platelets < 10K  - PRBC transfusion today  - Neulasta, refusing OBI at d/c     4. Metabolic: Stable renal fxn and e-lytes, steroid-induced hyperglycemia  - Continue NS @ 100 cc/hr  - Replace Mg+ and K+ per protocol    5. Pulmonary: No acute issues   - Chronic Bronchitis/COPD 7 pulmonary nodule.   - Pulm Nodule: Stable on Repeat CT chest 04/23/17; cont to Monitor.   - Cont albuterol & pulmicort    6. GI/Nutrition: Appetite and oral intake is good   - Cont low microbial diet   - Dietary to follow as needed     7. 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  - Hold Lipitor while inpatient for chemotherapy and resume on d/c  - Echo (07/13/16)  - abnormal (paradoxical) septal  motion is present with preserved LVEF 55-60%.    8. Anxiety/Depression: Ongoing and this has definitely increased w/ his relapsed disease. He has verbalized that he is depressed   - Cont Zoloft and trazadone daily  - Cont Ativan PRN  - Psych to follow as needed     9. Insomnia:ongoing, has failed multiple sleep aids  - Cont Trazodone nightly     10. Peripheral Neuropathy: Ongoing  - Cont Gabapentin 300 mg TID     11. Bone Health: H/o spinal cord compression & diffuse lytic lesions t/o skeleton  - CT Chest (04/23/17) - multiple lytic lesions throughout the skeleton   - Cont Ca/Vit D & zometa   - S/p Zometa 05/24/17    12. Critical Illness Myopathy: Improving  - he plans of walking and cont his exercises while inpatient     13.  Acute Pain:  He c/o increased lower back pain and pain under right scapula. He is concerned about increased lesions     14.  Steroid-induced hyperglycemia:  -Start accu-checks and low-dose sliding scale        - DVT Prophylaxis: Platelets <50,000 cells/dL - prophylactic lovenox on hold and mechanical prophylaxis with bilateral SCDs while in bed in place.  Contraindications to pharmacologic prophylaxis: Thrombocytopenia  Contraindications to mechanical prophylaxis:  None    - Disposition: Plan for discharge Saturday or Sunday after completion of chemo      Jody M Moehring, APRN - CNP   Harlene Salts, MD  [facility]  Please contact me through [place]

## 2017-05-26 NOTE — Plan of Care (Signed)
Problem: Falls - Risk of:  Goal: Will remain free from falls  Will remain free from falls   Outcome: Ongoing  Orthostatic vital signs obtained at start of shift - see flowsheet for details.  Pt does not meet criteria for orthostasis.  Pt is a Med fall risk. See Leamon Arnt Fall Score and ABCDS Injury Risk assessments.     - Screening for Orthostasis AND not a High Falls Risk per MORSE/ABCDS: Pt bed is in low position, side rails up, call light and belongings are in reach.  Fall risk light is on outside pts room.  Pt encouraged to call for assistance as needed. Will continue with hourly rounds for PO intake, pain needs, toileting and repositioning as needed.       Problem: Bleeding:  Goal: Will show no signs and symptoms of excessive bleeding  Will show no signs and symptoms of excessive bleeding   Outcome: Ongoing  Patient's hemoglobin this AM:   Recent Labs      05/26/17   0255   HGB  6.8*     Patient's platelet count this AM:   Recent Labs      05/26/17   0255   PLT  23*    Thrombocytopenia Precautions in place.  Patient showing no signs or symptoms of active bleeding.  Patient transfused blood products per orders - see flowsheet.  Patient verbalizes understanding of all instructions. Will continue to assess and implement POC. Call light within reach and hourly rounding in place.     Problem: Venous Thromboembolism:  Goal: Will show no signs or symptoms of venous thromboembolism  Will show no signs or symptoms of venous thromboembolism   Outcome: Ongoing  Adherent with DVT Prevention: Pt is at risk for DVT d/t decreased mobility and cancer treatment.  Pt educated on importance of activity.  Pt has orders for SCDs while in bed.  Pt verbalizes understanding of need for prophylaxis while inpatient.     Problem: Discharge Planning:  Goal: Discharged to appropriate level of care  Discharged to appropriate level of care   Outcome: Ongoing      Problem: Infection - Central Venous Catheter-Associated Bloodstream  Infection:  Goal: Will show no infection signs and symptoms  Will show no infection signs and symptoms   Outcome: Ongoing  CVC site remains free of signs/symptoms of infection. No drainage, edema, erythema, pain, or warmth noted at site. Dressing changes continue per protocol and on an as needed basis - see flowsheet.       Problem: PROTECTIVE PRECAUTIONS  Goal: Patient will remain free of nosocomial Infections  Outcome: Ongoing  Pt remains in protective precautions. Pt, staff, and visitors adhering to handwashing guidelines. Pt educated to shower or bathe daily with chlorhexidine and linens changed daily per protocol. Pt verbalizes understanding of low microbial diet. Will continue to monitor.

## 2017-05-26 NOTE — Plan of Care (Signed)
Problem: Falls - Risk of:  Goal: Will remain free from falls  Will remain free from falls   Outcome: Ongoing  Orthostatic vital signs obtained at start of shift - see flowsheet for details.  Pt meets criteria for orthostasis.  Pt is a Med fall risk. See Leamon Arnt Fall Score and ABCDS Injury Risk assessments. Pt bed is in low position, side rails up, call light and belongings are in reach.  Fall risk light is on outside pts room.  Pt encouraged to call for assistance as needed. Will continue with hourly rounds for PO intake, pain needs, toileting and repositioning as needed.     Problem: Bleeding:  Goal: Will show no signs and symptoms of excessive bleeding  Will show no signs and symptoms of excessive bleeding   Outcome: Ongoing  Patient's hemoglobin this AM:   Recent Labs      05/26/17   0255   HGB  6.8*     Patient's platelet count this AM:   Recent Labs      05/26/17   0255   PLT  23*    Thrombocytopenia Precautions in place.  Patient showing no signs or symptoms of active bleeding.  Patient received transfusions per orders prior to this shift.  Patient verbalizes understanding of all instructions. Will continue to assess and implement POC. Call light within reach and hourly rounding in place.   Problem: Venous Thromboembolism:  Goal: Will show no signs or symptoms of venous thromboembolism  Will show no signs or symptoms of venous thromboembolism   Outcome: Ongoing  - DVT Prophylaxis: Platelets <50,000 cells/dL - prophylactic lovenox on hold and mechanical prophylaxis with bilateral SCDs while in bed in place.  Contraindications to pharmacologic prophylaxis: Thrombocytopenia  Contraindications to mechanical prophylaxis: None    Problem: Discharge Planning:  Goal: Discharged to appropriate level of care  Discharged to appropriate level of care   Outcome: Ongoing  Pt is aware of current treatment plan.     Problem: Infection - Central Venous Catheter-Associated Bloodstream Infection:  Goal: Will show no infection  signs and symptoms  Will show no infection signs and symptoms   Outcome: Ongoing  CVC site remains free of signs/symptoms of infection. No drainage, edema, erythema, pain, or warmth noted at site. Dressing changes continue per protocol and on an as needed basis - see flowsheet. Performed CHG bath today per [facility] protocol utilizing CHG solution in the shower.  CVC site cleansed with CHG wipe over dressing, skin surrounding dressing, and first 6" of IV tubing.  Pt tolerated well.  Continued to encourage daily CHG bathing per [facility] protocol.    Problem: PROTECTIVE PRECAUTIONS  Goal: Patient will remain free of nosocomial Infections  Outcome: Ongoing  Pt remains in protective precautions.  Pt educated on wearing mask when in hallways. Pt, staff, and visitors adhering to handwashing guidelines. Pt educated to shower or bathe daily with chlorhexidine and linens changed daily per protocol. Pt verbalizes understanding of low microbial diet. Will continue to monitor.

## 2017-05-26 NOTE — Care Coordination-Inpatient (Signed)
InitialSocial Work Note    Patient was admitted to the unit for scheduled C2 of DCEP.  He was diagnosed with MM in July of last year.  Bone marrow biopsy last month showed relapsed disease and plasma cell leukemia.  Patient is familiar with patient from other admissions to the unit.  SW checked on patient today.  We discussed his treatment history.  He is a on a waiting list for CAR-T in [place] and may also look into [facility] in [place]. Patient will discharge to his significant other's home, [name].  He said working with [facility] for therapy had helped him gain some strength back.  He is interested in continuing with this.      Plan of care:  SW will monitor for discharge and psychosocial needs    Estimated discharge date: when chemotherapy is completed  Disposition: d/c to SO's home  Advanced Directives in chart? Yes 08/26/2017  Patient/family aware of discharge plans? Yes    Freddi Starr, MSW, [place]

## 2017-05-27 LAB — PREPARE RBC (CROSSMATCH): Dispense Status Blood Bank: TRANSFUSED

## 2017-05-27 LAB — CBC WITH AUTO DIFFERENTIAL
Basophils %: 0.1 %
Basophils Absolute: 0 10*3/uL (ref 0.0–0.2)
Eosinophils %: 0 %
Eosinophils Absolute: 0 10*3/uL (ref 0.0–0.6)
Hematocrit: 19.6 % — CL (ref 40.5–52.5)
Hemoglobin: 6.9 g/dL — CL (ref 13.5–17.5)
Lymphocytes %: 3.3 %
Lymphocytes Absolute: 0.1 10*3/uL — ABNORMAL LOW (ref 1.0–5.1)
MCH: 30.3 pg (ref 26.0–34.0)
MCHC: 35.1 g/dL (ref 31.0–36.0)
MCV: 86.2 fL (ref 80.0–100.0)
MPV: 7.7 fL (ref 5.0–10.5)
Monocytes %: 8.1 %
Monocytes Absolute: 0.4 10*3/uL (ref 0.0–1.3)
Neutrophils %: 88.5 %
Neutrophils Absolute: 3.9 10*3/uL (ref 1.7–7.7)
Platelets: 21 10*3/uL — ABNORMAL LOW (ref 135–450)
RBC: 2.28 M/uL — ABNORMAL LOW (ref 4.20–5.90)
RDW: 14.4 % (ref 12.4–15.4)
WBC: 4.4 10*3/uL (ref 4.0–11.0)

## 2017-05-27 LAB — CULTURE, VRE: VRE Culture: NEGATIVE

## 2017-05-27 LAB — POCT GLUCOSE
POC Glucose: 210 mg/dl — ABNORMAL HIGH (ref 70–99)
POC Glucose: 113 mg/dl — ABNORMAL HIGH (ref 70–99)
POC Glucose: 154 mg/dl — ABNORMAL HIGH (ref 70–99)
POC Glucose: 110 mg/dl — ABNORMAL HIGH (ref 70–99)

## 2017-05-27 LAB — HEPATIC FUNCTION PANEL
ALT: 40 U/L (ref 10–40)
AST: 37 U/L (ref 15–37)
Albumin: 3.6 g/dL (ref 3.4–5.0)
Alkaline Phosphatase: 238 U/L — ABNORMAL HIGH (ref 40–129)
Bilirubin, Direct: <0.2 mg/dL (ref 0.0–0.3)
Total Bilirubin: 0.3 mg/dL (ref 0.0–1.0)
Total Protein: 6 g/dL — ABNORMAL LOW (ref 6.4–8.2)

## 2017-05-27 LAB — BASIC METABOLIC PANEL
Anion Gap: 14 (ref 3–16)
BUN: 24 mg/dL — ABNORMAL HIGH (ref 7–20)
CO2: 19 mmol/L — ABNORMAL LOW (ref 21–32)
Calcium: 8.1 mg/dL — ABNORMAL LOW (ref 8.3–10.6)
Chloride: 105 mmol/L (ref 99–110)
Creatinine: 0.9 mg/dL (ref 0.9–1.3)
GFR African American: >60 (ref >60)
GFR Non-African American: >60 (ref >60)
Glucose: 341 mg/dL — ABNORMAL HIGH (ref 70–99)
Potassium: 3.8 mmol/L (ref 3.5–5.1)
Sodium: 138 mmol/L (ref 136–145)

## 2017-05-27 LAB — PROTIME-INR
INR: 1.05 (ref 0.86–1.14)
Protime: 12 s (ref 9.8–13.0)

## 2017-05-27 LAB — URIC ACID: Uric Acid, Serum: 3.4 mg/dL — ABNORMAL LOW (ref 3.5–7.2)

## 2017-05-27 LAB — APTT: aPTT: 27.4 s (ref 26.0–36.0)

## 2017-05-27 LAB — LACTATE DEHYDROGENASE: LD: 149 U/L (ref 100–190)

## 2017-05-27 LAB — MAGNESIUM: Magnesium: 2 mg/dL (ref 1.80–2.40)

## 2017-05-27 LAB — PHOSPHORUS: Phosphorus: 2.4 mg/dL — ABNORMAL LOW (ref 2.5–4.9)

## 2017-05-27 MED ORDER — SODIUM CHLORIDE 0.9 % IV BOLUS
0.9 % | Freq: Once | INTRAVENOUS | Status: AC
Start: 2017-05-27 — End: 2017-05-27
  Administered 2017-05-27: 10:00:00 250 mL via INTRAVENOUS

## 2017-05-27 MED FILL — SERTRALINE HCL 50 MG PO TABS: 50 MG | ORAL | Qty: 1

## 2017-05-27 MED FILL — TRAZODONE HCL 50 MG PO TABS: 50 MG | ORAL | Qty: 1

## 2017-05-27 MED FILL — ONDANSETRON HCL 8 MG PO TABS: 8 MG | ORAL | Qty: 3

## 2017-05-27 MED FILL — ALBUTEROL SULFATE (2.5 MG/3ML) 0.083% IN NEBU: RESPIRATORY_TRACT | Qty: 3

## 2017-05-27 MED FILL — FUROSEMIDE 10 MG/ML IJ SOLN: 10 MG/ML | INTRAMUSCULAR | Qty: 4

## 2017-05-27 NOTE — Oncology Nurse Navigation (Signed)
Problem: Falls - Risk of:  Goal: Will remain free from falls  Will remain free from falls   Orthostatic vital signs obtained at start of shift - see flowsheet for details.  Pt does not meet criteria for orthostasis.  Pt is a Med fall risk. See Leamon Arnt Fall Score and ABCDS Injury Risk assessments. Pt bed is in low position, side rails up, call light and belongings are in reach.  Fall risk light is on outside pts room.  Pt encouraged to call for assistance as needed. Will continue with hourly rounds for PO intake, pain needs, toileting and repositioning as needed.     Problem: Bleeding:  Goal: Will show no signs and symptoms of excessive bleeding  Will show no signs and symptoms of excessive bleeding   Patient's hemoglobin this AM:       Recent Labs      05/27/17   0300   HGB  6.9*     Patient's platelet count this AM:       Recent Labs      05/27/17   0300   PLT  21*    Thrombocytopenia Precautions in place.  Patient showing no signs or symptoms of active bleeding.  Patient received transfusions per orders prior to this shift.  Patient verbalizes understanding of all instructions. Will continue to assess and implement POC. Call light within reach and hourly rounding in place.     Problem: Discharge Planning:  Goal: Discharged to appropriate level of care  Discharged to appropriate level of care   Discussed with patient criteria that has to be met prior to discharge i.e. Chemo completion, absence of fever, diarrhea, eating and being able to perform ADL's independently. Patient verbalized understanding. Will continue to assess needs and readiness for discharge.     Problem: Infection - Central Venous Catheter-Associated Bloodstream Infection:  Goal: Will show no infection signs and symptoms  Will show no infection signs and symptoms   CVC site remains free of signs/symptoms of infection. No drainage, edema, erythema, pain, or warmth noted at site. Dressing changes continue per protocol and on an as needed basis - see  flowsheet.  CVC site cleansed with CHG wipe over dressing, skin surrounding dressing, and first 6" of IV tubing. Continued to encourage daily CHG bathing per [facility] protocol.      Problem: PROTECTIVE PRECAUTIONS  Goal: Patient will remain free of nosocomial Infections    Intervention: PLACE PATIENT IN PRIVATE ROOM  Pt remains in protective precautions. No living plants or fresh flowers in his room. Patient educated on wearing mask when in hallways. Patient, staff, and visitors adhering to handwashing guidelines. Patient cleansed with chlorhexidine wipes and linens changed daily per protocol. Pt verbalizes understanding of low microbial diet. Patient remains free of nosocomial infections.

## 2017-05-27 NOTE — Care Coordination-Inpatient (Signed)
Type of Admission  Multiple Myeloma IgG   Course #2 Day # 3 DCEP    Central venous catheter  Right Basilic Double Lumen PICC( 04/26/17, Interventional Radiology)    Plan  Proceed with Course #2 of DCEP. Hoping for eventual enrollment in CAR-t study in another facility out of town    Update  05/25/17:  Planned admission for 2nd course of DCEP. States he does not want Neulasta OBI after this course, "I don't like somehting sticking our of my arm", will inform Dr. Hunt Oris  9/27: Tolerating therapy well.     Education  05/25/17:  Reviewed treatment calendar with patient  9/27: Informed TC of his hope to go to [facility] for CAR-t study. Explained timeline of this present treatment, followed by travel to [place] for study.     Discharge  Home Saturday after going to [facility] for Neulasta  Lives in [place], [place], with [place].     Pending  Requested appts at [facility] for Neulasta on Saturday and HFU on Monday, in light of his low blood counts.

## 2017-05-27 NOTE — Progress Notes (Signed)
David Watkins Progress Note    05/27/2017     [name]    MRN: [id number]    DOB: [birth year]    Referring MD: Eulis Canner, [place] [address]      SUBJECTIVE:  Tolerating chemo well.    ECOG PS:  (1) Restricted in physically strenuous activity, ambulatory and able to do work of light nature    Isolation: None    Medications    Scheduled Meds:  . sodium chloride  250 mL Intravenous Once   . insulin lispro  0-6 Units Subcutaneous TID WC   . insulin lispro  0-3 Units Subcutaneous Nightly   . Saline Mouthwash  15 mL Swish & Spit 4x Daily AC & HS   . sodium chloride flush  10 mL Intravenous 2 times per day   . traZODone  50 mg Oral Nightly   . valACYclovir  500 mg Oral BID   . sertraline  50 mg Oral Daily   . pantoprazole  40 mg Oral QAM AC   . gabapentin  300 mg Oral TID   . budesonide  0.5 mg Nebulization BID   . ondansetron  24 mg Oral Q24H   . dexamethasone  40 mg Oral Q24H   . furosemide  40 mg Intravenous Q12H   . albuterol  2.5 mg Nebulization 4x daily     Continuous Infusions:  . dextrose     . sodium chloride     . sodium chloride 1,000 mL (05/27/17 0023)   . cyclophosphamide/etoposide/CISplatin chemo infusion 41.7 mL/hr at 05/26/17 1724     PRN Meds:.medicated lip ointment, glucose, dextrose, glucagon (rDNA), dextrose, sodium chloride, alteplase, magnesium hydroxide, magnesium sulfate, potassium chloride, Saline Mouthwash, sodium chloride flush, prochlorperazine **OR** prochlorperazine, LORazepam **OR** LORazepam    ROS   Constitutional: Denies fever, sweats, weight loss.     Eyes: No visual changes or diplopia. No scleral icterus.   ENT: No Headaches, hearing loss or vertigo. No mouth sores or sore throat.   Cardiovascular: No chest pain, dyspnea on exertion, palpitations or loss of consciousness.    Respiratory: No cough or wheezing, no sputum production. No hemoptysis.   Gastrointestinal: No abdominal pain, appetite loss, blood in stools. No change in bowel  habits.   Genitourinary: No dysuria, trouble voiding, or hematuria.   Musculoskeletal:  No generalized weakness. No joint complaints.   Integumentary: No rash or pruritis.   Neurological: No headache, diplopia. No change in gait, balance, or coordination. No paresthesias.   Endocrine: No temperature intolerance. No excessive thirst, fluid intake, or urination.    Hematologic/Lymphatic: No abnormal bruising or ecchymoses, blood clots or swollen lymph nodes.   Allergic/Immunologic: No nasal congestion or hives.     Physical Exam:     I&O:      Intake/Output Summary (Last 24 hours) at 05/27/17 0741  Last data filed at 05/27/17 0557   Gross per 24 hour   Intake             4241 ml   Output             4425 ml   Net             -184 ml       Vital Signs:  BP 106/69   Pulse 88   Temp 97.9 F (36.6 C) (Oral)   Resp 16   Ht 5' 9"  (1.753 m)   Wt 182 lb 12.8 oz (  82.9 kg)   SpO2 99%   BMI 26.99 kg/m     Weight:    Wt Readings from Last 3 Encounters:   05/26/17 182 lb 12.8 oz (82.9 kg)   05/01/17 182 lb (82.6 kg)   04/05/17 180 lb (81.6 kg)         General: Awake, alert and oriented.  HEENT: normocephalic, PERRL, no scleral erythema or icterus, Oral mucosa moist and intact, throat clear  NECK: supple without palpable adenopathy  BACK: Straight negative CVAT  SKIN: warm dry and intact without lesions rashes or masses  CHEST: CTA bilaterally without use of accessory muscles  CV: Normal S1 S2, RRR, no MRG  ABD: NT ND normoactive BS, no palpable masses or hepatosplenomegaly  EXTREMITIES: without edema, denies calf tenderness  NEURO: CN II - XII grossly intact  CATHETER: RUE DL PICC (04/26/17 - IR) - site CDI    Data    CBC:   Recent Labs      05/25/17   1035  05/26/17   0255  05/27/17   0300   WBC  3.1*  3.0*  4.4   HGB  6.8*  6.8*  6.9*   HCT  18.8*  19.6*  19.6*   MCV  84.5  86.1  86.2   PLT  28*  23*  21*     BMP/Mag:  Recent Labs      05/25/17   1035  05/26/17   0255  05/27/17   0300   NA  138  138  138   K  4.5   4.4  3.8   CL  103  105  105   CO2  24  18*  19*   PHOS  4.3  3.0  2.4*   BUN  18  17  24*   CREATININE  1.0  0.8*  0.9   MG  1.90   --   2.00     LIVP:   Recent Labs      05/25/17   1035  05/26/17   0255  05/27/17   0300   AST  15  21  37   ALT  19  23  40   BILIDIR  <0.2  <0.2  <0.2   BILITOT  0.5  0.4  0.3   ALKPHOS  262*  246*  238*     Coags:   Recent Labs      05/25/17   1035  05/27/17   0300   PROTIME  12.6  12.0   INR  1.11  1.05   APTT  33.0  27.4     Uric Acid   Recent Labs      05/25/17   1035  05/26/17   0255  05/27/17   0300   LABURIC  2.7*  3.1*  3.4*     DIAGNOSTIC IMAGING:  1. CT Chest 04/23/17:  1. Redemonstration of multiple lytic lesions throughout the skeleton compatible with patient's history of multiple myeloma.   2. 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 Stable scattered submillimeter pulmonary parenchymal nodules.   4. Marked centrilobular emphysematous changes with upper lobe predominance, stable.   5. Progression of splenomegaly       PATHOLOGY:  1. BM Bx/Asp 04/24/17  Bone marrow biopsy, clot section, aspirate smear, touch imprint,  peripheral blood smear and flow cytometry data:  Plasma cell  leukemia.    There is a single continuous sheet of atypical plasma cells  (shown to be kappa monotypic via flow cytometry) effacing the bone  marrow. The cellularity is 100% and there is minimal evidence of  normal-appearing hematopoietic activity. Genetic studies are pending.  The morphologic features are most consistent with the plasmablastic  variant. There is a patchy 3+ increase in reticulin fibers.    2. Myeloma Studies 04/24/17:  SPEP/SIFE: monoclonal IgG kappa by immunofixation(6.65 g/dl)  SFLCs:   Free Kappa Light Chains 637  Free Lambda Light Chain 20.85  Free Kappa/Lambda Ratio 30.55  Immunoglobulins: IgA 106, IgG 6379, IgM 29  B2: 16.5  UPEP/UIFE: Urine IFE shows  a polyclonal increase in free kappa and/or   free lambda light chains. No monoclonal free light chains   (Bence Jones Protein) are detected. Urine IFE shows a   monoclonal IgG heavy chain with associated kappa light   chain.   UFLCs:   Free Urinary Kappa Light Chains 150.00 H mg/dL   Free Urinary Lambda Light Chain 9.06 H mg/dL   Free Urinary Kappa/Lambda Ratio 16.56 H ratio     Myeloma Labs (05/20/17):  SFLC (05/20/17):  Kappa - 23.80  Lambda - 24.70  Ratio - 0.96    Immunoglobulins (05/20/17):  IgG - 1270  IgA - 117  IgM - 25    SPEP / SIFE (05/20/17):  M-spike:  0.8 g/dL  Serum immunofixation electrophoresis reveals two M-spikes, one of which is recent.  Persistent is M2 in mid-to-slow gamma, a monoclonal IgG kappa. The recent M-spike, M1, is minor monoclonal IgG lambda in mid-gamma; M1 isnot visible by SPE, only by IFE.    Beta 2 Microglobulin (05/20/17);  2.19    PROBLEM LIST:      IgG Kappa Multiple myeloma  Peripheral neuropathy  Depression  Erectile dysfunction  Hyperlipidemia  Hypertension  Insomnia  Low back pain - h/o lytic lesions as well as cord compression at T6 and T11    TREATMENT:      1. Rad Tx to T5-7, T10-L3, Right Scapula - 3000 cGy - Dr. Jamas Lav 03/20/16-04/01/16  2. RVD x1 03/20/16 - discontinued d/t rash   3. Velcade/Pomalyst/Dex x3 cycles 04/23/16-06/17/16 - discontinued d/t reaction to pomalyst  4. Velcade/Dex x 1 cycle 06/26/16 (last dose of dexamethasone 07/22/16  5. High-dose melphalan followed by administration of PBSCs 2.36 x10^6 cd34cells/kg on 08/28/16  6. Maintenance Revlimid 50m daily (12/2016-04/23/17)  7. Dexamethasone 451mdaily (04/24/17)  8. DCEP   Cycle #1 - 04/26/17  Cycle #2 - 05/25/17    ASSESSMENT AND PLAN:     1. IgG kappa multiple myeloma / Plasma Cell Leukemia: [place] relapse of his disease in 03/2017 w/ progressive anorexia & weakness, 17 lb wt loss & severe thrombocytopenia   - Good response after 1 cycle of DCEP,  see labs above. Monoclonal protein down to 0.8 g/dL from 6.6.5 g/dL    Plan:  second cycle of DCEP, then possibly transition to Dara/Pom/Dex, after consultation at [facility].     DCEP Cycle 2, Day + 3    2. ID: Afebrile and w/out evidence of infection  - Cont Valtrex prophylaxis for 1 year post tx    3. Heme: Thrombocytopenia and anemia d/t chemotherapy  - Transfuse for Hgb < 7 and Platelets < 10K  - PRBC transfusion today  - Neulasta, refusing OBI at d/c     4. Metabolic: Stable renal fxn and e-lytes, steroid-induced hyperglycemia & mild met acidosis and  hypoPhos  - Cont NS @ 100 mL/hr  - Replace Mg+ and K+ per protocol    5. Pulmonary: No acute issues   - Chronic Bronchitis/COPD 7 pulmonary nodule.   - Pulm Nodule: Stable on Repeat CT chest 04/23/17; cont to Monitor.   - Cont albuterol & pulmicort    6. GI/Nutrition: Appetite and oral intake is good   - Cont low microbial diet   - Dietary to follow as needed     7. 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  - Hold Lipitor while inpatient for chemotherapy and resume on d/c  - Echo (07/13/16)  - abnormal (paradoxical) septal motion is present with preserved LVEF 55-60%.      8. Anxiety/Depression: Ongoing and this has definitely increased w/ his relapsed disease. He has verbalized that he is depressed   - Cont Zoloft and trazadone daily  - Cont Ativan PRN  - Psych to follow as needed     9. Insomnia:ongoing, has failed multiple sleep aids  - Cont Trazodone nightly     10. Peripheral Neuropathy: Ongoing  - Cont Gabapentin 300 mg TID     11. Bone Health: H/o spinal cord compression & diffuse lytic lesions t/o skeleton  - CT Chest (04/23/17) - multiple lytic lesions throughout the skeleton   - Cont Ca/Vit D & zometa   - S/p Zometa 05/24/17    12. Critical Illness Myopathy: Improving  - he plans of walking and cont his exercises while inpatient     13.  Acute Pain:  He c/o increased lower back pain and pain under right scapula. He is  concerned about increased lesions   - Consider repeat imaging if increases     14.  Steroid-induced hyperglycemia: Ongoing   - Cont accu-checks and low-dose Lispro sliding scale AC&HS      - DVT Prophylaxis: Platelets <50,000 cells/dL - prophylactic lovenox on hold and mechanical prophylaxis with bilateral SCDs while in bed in place.  Contraindications to pharmacologic prophylaxis: Thrombocytopenia  Contraindications to mechanical prophylaxis: None    - Disposition: Plan for discharge Saturday or Sunday after completion of chemo      Wayland Salinas, APRN - CNP   Harlene Salts, MD  [facility]  Please contact me through [place]

## 2017-05-27 NOTE — Plan of Care (Signed)
Problem: Anxiety:  Goal: Level of anxiety will decrease  Level of anxiety will decrease  Patient verbalized feeling anxious prior to hanging the next bag of continuous Chemotherapy. Encouraged patient to verbalized his feelings and provided support. Patient requested for antianxiety. Administered Ativan as per order. Upon re-assessment patient verbalized some relief.

## 2017-05-27 NOTE — Plan of Care (Signed)
Problem: Falls - Risk of:  Goal: Will remain free from falls  Will remain free from falls   Outcome: Ongoing  Orthostatic vital signs obtained at start of shift - see flowsheet for details.  Pt does not meet criteria for orthostasis.  Pt is a Med fall risk. See Leamon Arnt Fall Score and ABCDS Injury Risk assessments.     - Screening for Orthostasis AND not a High Falls Risk per MORSE/ABCDS: Pt bed is in low position, side rails up, call light and belongings are in reach.  Fall risk light is on outside pts room.  Pt encouraged to call for assistance as needed. Will continue with hourly rounds for PO intake, pain needs, toileting and repositioning as needed.       Problem: Bleeding:  Goal: Will show no signs and symptoms of excessive bleeding  Will show no signs and symptoms of excessive bleeding   Outcome: Ongoing  Patient's hemoglobin this AM:   Recent Labs      05/26/17   0255   HGB  6.8*     Patient's platelet count this AM:   Recent Labs      05/26/17   0255   PLT  23*    Thrombocytopenia Precautions in place.  Patient showing no signs or symptoms of active bleeding.  Patient received transfusions per orders prior to this shift.  Patient verbalizes understanding of all instructions. Will continue to assess and implement POC. Call light within reach and hourly rounding in place.       Problem: Venous Thromboembolism:  Goal: Will show no signs or symptoms of venous thromboembolism  Will show no signs or symptoms of venous thromboembolism   Outcome: Ongoing  Adherent with DVT Prevention: Pt is at risk for DVT d/t decreased mobility and cancer treatment.  Pt educated on importance of activity.  Pt has orders for SCDs while in bed.  Pt verbalizes understanding of need for prophylaxis while inpatient.     Problem: Discharge Planning:  Goal: Discharged to appropriate level of care  Discharged to appropriate level of care   Outcome: Ongoing      Problem: Infection - Central Venous Catheter-Associated Bloodstream  Infection:  Goal: Will show no infection signs and symptoms  Will show no infection signs and symptoms   Outcome: Ongoing  CVC site remains free of signs/symptoms of infection. No drainage, edema, erythema, pain, or warmth noted at site. Dressing changes continue per protocol and on an as needed basis - see flowsheet.     Compliant with [facility] Bath Protocol:  Performed CHG bath today per [facility] protocol utilizing CHG solution in the shower.  CVC site cleansed with CHG wipe over dressing, skin surrounding dressing, and first 6" of IV tubing.  Pt tolerated well.  Continued to encourage daily CHG bathing per [facility] protocol.      Problem: PROTECTIVE PRECAUTIONS  Goal: Patient will remain free of nosocomial Infections  Outcome: Ongoing  Pt remains in protective precautions. Pt, staff, and visitors adhering to handwashing guidelines. Pt educated to shower or bathe daily with chlorhexidine and linens changed daily per protocol. Pt verbalizes understanding of low microbial diet. Will continue to monitor.

## 2017-05-28 LAB — HEPATIC FUNCTION PANEL
ALT: 52 U/L — ABNORMAL HIGH (ref 10–40)
AST: 35 U/L (ref 15–37)
Albumin: 3.6 g/dL (ref 3.4–5.0)
Alkaline Phosphatase: 215 U/L — ABNORMAL HIGH (ref 40–129)
Bilirubin, Direct: <0.2 mg/dL (ref 0.0–0.3)
Total Bilirubin: 0.4 mg/dL (ref 0.0–1.0)
Total Protein: 6.2 g/dL — ABNORMAL LOW (ref 6.4–8.2)

## 2017-05-28 LAB — POCT GLUCOSE
POC Glucose: 249 mg/dl — ABNORMAL HIGH (ref 70–99)
POC Glucose: 170 mg/dl — ABNORMAL HIGH (ref 70–99)
POC Glucose: 112 mg/dl — ABNORMAL HIGH (ref 70–99)
POC Glucose: 106 mg/dl — ABNORMAL HIGH (ref 70–99)

## 2017-05-28 LAB — BASIC METABOLIC PANEL
Anion Gap: 14 (ref 3–16)
BUN: 21 mg/dL — ABNORMAL HIGH (ref 7–20)
CO2: 22 mmol/L (ref 21–32)
Calcium: 7.4 mg/dL — ABNORMAL LOW (ref 8.3–10.6)
Chloride: 103 mmol/L (ref 99–110)
Creatinine: 0.8 mg/dL — ABNORMAL LOW (ref 0.9–1.3)
GFR African American: >60 (ref >60)
GFR Non-African American: >60 (ref >60)
Glucose: 196 mg/dL — ABNORMAL HIGH (ref 70–99)
Potassium: 3.9 mmol/L (ref 3.5–5.1)
Sodium: 139 mmol/L (ref 136–145)

## 2017-05-28 LAB — CBC WITH AUTO DIFFERENTIAL
Basophils %: 0.1 %
Basophils Absolute: 0 10*3/uL (ref 0.0–0.2)
Eosinophils %: 0 %
Eosinophils Absolute: 0 10*3/uL (ref 0.0–0.6)
Hematocrit: 21.8 % — ABNORMAL LOW (ref 40.5–52.5)
Hemoglobin: 7.7 g/dL — ABNORMAL LOW (ref 13.5–17.5)
Lymphocytes %: 3.8 %
Lymphocytes Absolute: 0.1 10*3/uL — ABNORMAL LOW (ref 1.0–5.1)
MCH: 30.3 pg (ref 26.0–34.0)
MCHC: 35.3 g/dL (ref 31.0–36.0)
MCV: 85.8 fL (ref 80.0–100.0)
MPV: 7.5 fL (ref 5.0–10.5)
Monocytes %: 9.9 %
Monocytes Absolute: 0.3 10*3/uL (ref 0.0–1.3)
Neutrophils %: 86.2 %
Neutrophils Absolute: 2.8 10*3/uL (ref 1.7–7.7)
Platelets: 21 10*3/uL — ABNORMAL LOW (ref 135–450)
RBC: 2.54 M/uL — ABNORMAL LOW (ref 4.20–5.90)
RDW: 14.5 % (ref 12.4–15.4)
WBC: 3.2 10*3/uL — ABNORMAL LOW (ref 4.0–11.0)

## 2017-05-28 LAB — LACTATE DEHYDROGENASE: LD: 147 U/L (ref 100–190)

## 2017-05-28 LAB — URIC ACID: Uric Acid, Serum: 3.2 mg/dL — ABNORMAL LOW (ref 3.5–7.2)

## 2017-05-28 LAB — PHOSPHORUS: Phosphorus: 3.3 mg/dL (ref 2.5–4.9)

## 2017-05-28 MED FILL — SERTRALINE HCL 50 MG PO TABS: 50 MG | ORAL | Qty: 1

## 2017-05-28 MED FILL — TRAZODONE HCL 50 MG PO TABS: 50 MG | ORAL | Qty: 1

## 2017-05-28 MED FILL — DEXAMETHASONE 4 MG PO TABS: 4 MG | ORAL | Qty: 10

## 2017-05-28 MED FILL — ONDANSETRON HCL 8 MG PO TABS: 8 MG | ORAL | Qty: 3

## 2017-05-28 NOTE — Progress Notes (Signed)
Administration: Chemotherapy drug DCEP independently verified with Donnella Sham RN prior to administration.  Acknowledgement of informed consent for chemotherapy administration verified.  Original order, appropriateness of regimen, drug supplied, height, weight, BSA, dose calculations, expiration dates/times, drug appearance, and two patient identifiers were verified by both RNs.  Drug checked for vesicant/irritant status and for risk of hypersensitivity.  Most recent laboratory values and allergies, were reviewed.  Positive, brisk blood return via CVC was confirmed prior to administration. Chest x-ray for correct line placement reviewed. Sarthak Rubenstein L Riannah Stagner and [employer] RN verified correct rate of chemotherapy and maintenance IV fluids.  Patient was educated on chemotherapy regimen prior to administration including indication for treatment related to disease & side effects of chemotherapy drug.  Patient verbalizes understanding of all instructions.    Completion of Chemotherapy: Monitoring during infusion done per policy, see Flowsheets.  Blood return verified before, during, and after infusion per policy; no signs of extravasation.  Pt tolerating chemotherapy well and without incident.  Chemotherapy infusion end time on the [facility].  Will continue to monitor.

## 2017-05-28 NOTE — Behavioral Health Treatment Team (Signed)
Psychology Progress Note    Psychological trainee checked-in with pt who was in good spirits.  He described feeling physically better than he had anticipated.  Pt had been making work calls much of the day.     David Watkins, [place]

## 2017-05-28 NOTE — Plan of Care (Signed)
Problem: Falls - Risk of:  Goal: Will remain free from falls  Will remain free from falls   Orthostatic vital signs obtained at start of shift - see flowsheet for details.  Pt does not meet criteria for orthostasis.  Pt is a Med fall risk. See Leamon Arnt Fall Score and ABCDS Injury Risk assessments. Pt bed is in low position, side rails up, call light and belongings are in reach.  Fall risk light is on outside pts room.  Pt encouraged to call for assistance as needed. Will continue with hourly rounds for PO intake, pain needs, toileting and repositioning as needed.     Problem: Bleeding:  Goal: Will show no signs and symptoms of excessive bleeding  Will show no signs and symptoms of excessive bleeding   Patient's hemoglobin this AM:   Recent Labs      05/28/17   0335   HGB  7.7*     Patient's platelet count this AM:   Recent Labs      05/28/17   0335   PLT  21*    Thrombocytopenia Precautions in place.  Patient showing no signs or symptoms of active bleeding.  Transfusion not indicated at this time.  Patient verbalizes understanding of all instructions. Will continue to assess and implement POC. Call light within reach and hourly rounding in place.     Problem: Infection - Central Venous Catheter-Associated Bloodstream Infection:  Goal: Will show no infection signs and symptoms  Will show no infection signs and symptoms   CVC site remains free of signs/symptoms of infection. No drainage, edema, erythema, pain, or warmth noted at site. Dressing changes continue per protocol and on an as needed basis - see flowsheet.         Problem: PROTECTIVE PRECAUTIONS  Goal: Patient will remain free of nosocomial Infections  Pt remains in protective precautions. No living plants or fresh flowers in his room. Patient educated on wearing mask when in hallways. Patient, staff, and visitors adhering to handwashing guidelines. Patient cleansed with chlorhexidine wipes and linens changed daily per protocol. Pt verbalizes understanding of  low microbial diet. Patient remains free of nosocomial infections.     Problem: Anxiety:  Goal: Level of anxiety will decrease  Level of anxiety will decrease   Patient anxious this shift.

## 2017-05-28 NOTE — Oncology Nurse Navigation (Signed)
Problem: Falls - Risk of:  Goal: Will remain free from falls  Will remain free from falls   Orthostatic vital signs obtained at start of shift - see flowsheet for details.  Pt does not meet criteria for orthostasis.  Pt is a Med fall risk. See Leamon Arnt Fall Score and ABCDS Injury Risk assessments. Pt bed is in low position, side rails up, call light and belongings are in reach.  Fall risk light is on outside pts room.  Pt encouraged to call for assistance as needed. Will continue with hourly rounds for PO intake, pain needs, toileting and repositioning as needed.     Problem: Bleeding:  Goal: Will show no signs and symptoms of excessive bleeding  Will show no signs and symptoms of excessive bleeding   Patient's hemoglobin this AM:   Recent Labs      05/28/17   0335   HGB  7.7*     Patient's platelet count this AM:   Recent Labs      05/28/17   0335   PLT  21*    Thrombocytopenia Precautions in place.  Patient showing no signs or symptoms of active bleeding.  Transfusion not indicated at this time.  Patient verbalizes understanding of all instructions. Will continue to assess and implement POC. Call light within reach and hourly rounding in place.         Problem: Discharge Planning:  Goal: Discharged to appropriate level of care  Discharged to appropriate level of care   Discussed with patient criteria that has to be met prior to discharge i.e. Chemo completion, absence of fever, diarrhea, eating and being able to perform ADL's independently. Patient verbalized understanding. Will continue to assess needs and readiness for discharge.     Problem: Infection - Central Venous Catheter-Associated Bloodstream Infection:  Goal: Will show no infection signs and symptoms  Will show no infection signs and symptoms   CVC site remains free of signs/symptoms of infection. No drainage, edema, erythema, pain, or warmth noted at site. Dressing changes continue per protocol and on an as needed basis - see flowsheet.  CVC site  cleansed with CHG wipe over dressing, skin surrounding dressing, and first 6" of IV tubing. Continued to encourage daily CHG bathing per [facility] protocol. Pt plans to shower this evening.        Problem: PROTECTIVE PRECAUTIONS  Goal: Patient will remain free of nosocomial Infections    Intervention: PLACE PATIENT IN PRIVATE ROOM  Pt remains in protective precautions. No living plants or fresh flowers in his room. Patient educated on wearing mask when in hallways. Patient, staff, and visitors adhering to handwashing guidelines. Patient cleansed with chlorhexidine wipes and linens changed daily per protocol. Pt verbalizes understanding of low microbial diet. Patient remains free of nosocomial infections.

## 2017-05-28 NOTE — Progress Notes (Signed)
[facility] Progress Note    05/28/2017     David Watkins    MRN: [id number]    DOB: 1955-09-22    Referring MD: Eulis Canner, [place] [address]    SUBJECTIVE:  Tolerating chemo well.    ECOG PS:  (1) Restricted in physically strenuous activity, ambulatory and able to do work of light nature    Isolation: None    Medications    Scheduled Meds:  . insulin lispro  0-6 Units Subcutaneous TID WC   . insulin lispro  0-3 Units Subcutaneous Nightly   . Saline Mouthwash  15 mL Swish & Spit 4x Daily AC & HS   . sodium chloride flush  10 mL Intravenous 2 times per day   . traZODone  50 mg Oral Nightly   . valACYclovir  500 mg Oral BID   . sertraline  50 mg Oral Daily   . pantoprazole  40 mg Oral QAM AC   . gabapentin  300 mg Oral TID   . budesonide  0.5 mg Nebulization BID   . ondansetron  24 mg Oral Q24H   . dexamethasone  40 mg Oral Q24H   . furosemide  40 mg Intravenous Q12H   . albuterol  2.5 mg Nebulization 4x daily     Continuous Infusions:  . dextrose     . sodium chloride     . sodium chloride 1,000 mL (05/28/17 0441)   . cyclophosphamide/etoposide/CISplatin chemo infusion 41.7 mL/hr at 05/27/17 1824     PRN Meds:.medicated lip ointment, glucose, dextrose, glucagon (rDNA), dextrose, sodium chloride, alteplase, magnesium hydroxide, magnesium sulfate, potassium chloride, Saline Mouthwash, sodium chloride flush, prochlorperazine **OR** prochlorperazine, LORazepam **OR** LORazepam    ROS   Constitutional: Denies fever, sweats, weight loss.     Eyes: No visual changes or diplopia. No scleral icterus.   ENT: No Headaches, hearing loss or vertigo. No mouth sores or sore throat.   Cardiovascular: No chest pain, dyspnea on exertion, palpitations or loss of consciousness.    Respiratory: No cough or wheezing, no sputum production. No hemoptysis.   Gastrointestinal: No abdominal pain, appetite loss, blood in stools. No change in bowel habits.   Genitourinary: No dysuria, trouble voiding, or  hematuria.   Musculoskeletal:  No joint complaints.   Integumentary: No rash or pruritis.   Neurological: No headache, diplopia. No change in gait, balance, or coordination. No paresthesias.   Endocrine: No temperature intolerance. No excessive thirst, fluid intake, or urination.    Hematologic/Lymphatic: No abnormal bruising or ecchymoses, blood clots or swollen lymph nodes.   Allergic/Immunologic: No nasal congestion or hives.     Physical Exam:     I&O:      Intake/Output Summary (Last 24 hours) at 05/28/17 0625  Last data filed at 05/28/17 0618   Gross per 24 hour   Intake             4357 ml   Output             5450 ml   Net            -1093 ml       Vital Signs:  BP 120/77   Pulse 81   Temp 97.6 F (36.4 C) (Oral)   Resp 18   Ht 5' 9"  (1.753 m)   Wt 182 lb 12.8 oz (82.9 kg)   SpO2 98%   BMI 26.99 kg/m     Weight:  Wt Readings from Last 3 Encounters:   05/26/17 182 lb 12.8 oz (82.9 kg)   05/01/17 182 lb (82.6 kg)   04/05/17 180 lb (81.6 kg)         General: Awake, alert and oriented.  HEENT: normocephalic, PERRL, no scleral erythema or icterus, Oral mucosa moist and intact, throat clear  NECK: supple without palpable adenopathy  BACK: Straight negative CVAT  SKIN: warm dry and intact without lesions rashes or masses  CHEST: CTA bilaterally without use of accessory muscles  CV: Normal S1 S2, RRR, no MRG  ABD: NT ND normoactive BS, no palpable masses or hepatosplenomegaly  EXTREMITIES: without edema, denies calf tenderness  NEURO: CN II - XII grossly intact  CATHETER: RUE DL PICC (04/26/17 - IR) - site CDI    Data    CBC:   Recent Labs      05/26/17   0255  05/27/17   0300  05/28/17   0335   WBC  3.0*  4.4  3.2*   HGB  6.8*  6.9*  7.7*   HCT  19.6*  19.6*  21.8*   MCV  86.1  86.2  85.8   PLT  23*  21*  21*     BMP/Mag:  Recent Labs      05/25/17   1035  05/26/17   0255  05/27/17   0300  05/28/17   0335   NA  138  138  138  139   K  4.5  4.4  3.8  3.9   CL  103  105  105  103   CO2  24  18*  19*   22   PHOS  4.3  3.0  2.4*  3.3   BUN  18  17  24*  21*   CREATININE  1.0  0.8*  0.9  0.8*   MG  1.90   --   2.00   --      LIVP:   Recent Labs      05/26/17   0255  05/27/17   0300  05/28/17   0335   AST  21  37  35   ALT  23  40  52*   BILIDIR  <0.2  <0.2  <0.2   BILITOT  0.4  0.3  0.4   ALKPHOS  246*  238*  215*     Coags:   Recent Labs      05/25/17   1035  05/27/17   0300   PROTIME  12.6  12.0   INR  1.11  1.05   APTT  33.0  27.4     Uric Acid   Recent Labs      05/26/17   0255  05/27/17   0300  05/28/17   0335   LABURIC  3.1*  3.4*  3.2*     DIAGNOSTIC IMAGING:  1. CT Chest 04/23/17:  1. Redemonstration of multiple lytic lesions throughout the skeleton compatible with patient's history of multiple myeloma.   2. 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 Stable scattered submillimeter pulmonary parenchymal nodules.   4. Marked centrilobular emphysematous changes with upper lobe predominance, stable.   5. Progression of splenomegaly       PATHOLOGY:  1. BM Bx/Asp 04/24/17  Bone marrow biopsy, clot section, aspirate smear, touch imprint,  peripheral blood smear and flow cytometry  data:  Plasma cell leukemia.    There is a single continuous sheet of atypical plasma cells  (shown to be kappa monotypic via flow cytometry) effacing the bone  marrow. The cellularity is 100% and there is minimal evidence of  normal-appearing hematopoietic activity. Genetic studies are pending.  The morphologic features are most consistent with the plasmablastic  variant. There is a patchy 3+ increase in reticulin fibers.    2. Myeloma Studies 04/24/17:  SPEP/SIFE: monoclonal IgG kappa by immunofixation(6.65 g/dl)  SFLCs:   Free Kappa Light Chains 637  Free Lambda Light Chain 20.85  Free Kappa/Lambda Ratio 30.55  Immunoglobulins: IgA 106, IgG 6379, IgM 29  B2: 16.5  UPEP/UIFE: Urine IFE shows a polyclonal increase  in free kappa and/or   free lambda light chains. No monoclonal free light chains   (Bence David Protein) are detected. Urine IFE shows a   monoclonal IgG heavy chain with associated kappa light   chain.   UFLCs:   Free Urinary Kappa Light Chains 150.00 H mg/dL   Free Urinary Lambda Light Chain 9.06 H mg/dL   Free Urinary Kappa/Lambda Ratio 16.56 H ratio     Myeloma Labs (05/20/17):  SFLC (05/20/17):  Kappa - 23.80  Lambda - 24.70  Ratio - 0.96    Immunoglobulins (05/20/17):  IgG - 1270  IgA - 117  IgM - 25    SPEP / SIFE (05/20/17):  M-spike:  0.8 g/dL  Serum immunofixation electrophoresis reveals two M-spikes, one of which is recent.  Persistent is M2 in mid-to-slow gamma, a monoclonal IgG kappa. The recent M-spike, M1, is minor monoclonal IgG lambda in mid-gamma; M1 isnot visible by SPE, only by IFE.    Beta 2 Microglobulin (05/20/17);  2.19    PROBLEM LIST:      IgG Kappa Multiple myeloma  Peripheral neuropathy  Depression  Erectile dysfunction  Hyperlipidemia  Hypertension  Insomnia  Low back pain - h/o lytic lesions as well as cord compression at T6 and T11    TREATMENT:      1. Rad Tx to T5-7, T10-L3, Right Scapula - 3000 cGy - Dr. Jamas Lav 03/20/16-04/01/16  2. RVD x1 03/20/16 - discontinued d/t rash   3. Velcade/Pomalyst/Dex x3 cycles 04/23/16-06/17/16 - discontinued d/t reaction to pomalyst  4. Velcade/Dex x 1 cycle 06/26/16 (last dose of dexamethasone 07/22/16  5. High-dose melphalan followed by administration of PBSCs 2.36 x10^6 cd34cells/kg on 08/28/16  6. Maintenance Revlimid 49m daily (12/2016-04/23/17)  7. Dexamethasone 415mdaily (04/24/17)  8. DCEP   Cycle #1 - 04/26/17  Cycle #2 - 05/25/17    ASSESSMENT AND PLAN:     1. IgG kappa multiple myeloma / Plasma Cell Leukemia: [place] relapse of his disease in 03/2017 w/ progressive anorexia & weakness, 17 lb wt loss & severe thrombocytopenia   - Good response after 1 cycle of DCEP, see labs above.  Monoclonal protein down to 0.8 g/dL from 6.6.5 g/dL    Plan:  second cycle of DCEP, then possibly transition to Dara/Pom/Dex, after consultation at [facility] for possible CAR-T.     DCEP Cycle 2, Day + 4    2. ID: Afebrile and w/out evidence of infection  - Cont Valtrex prophylaxis for 1 year post tx    3. Heme: Thrombocytopenia and anemia d/t chemotherapy  - Transfuse for Hgb < 7 and Platelets < 10K  - No transfusion today  - Neulasta, refusing OBI at d/c     4. Metabolic: Stable renal fxn and e-lytes,  steroid-induced hyperglycemia   - Cont NS @ 100 mL/hr  - Replace Mg+ and K+ per protocol    5. Pulmonary: No acute issues   - Chronic Bronchitis/COPD & pulmonary nodule.   - Pulm Nodule: Stable on repeat CT chest (04/23/17); cont to monitor.   - Cont albuterol & pulmicort    6. GI/Nutrition: Appetite and oral intake is good   - Cont low microbial diet   - Dietary to follow as needed     7. 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  - Hold Lipitor while inpatient for chemotherapy and resume on d/c  - Echo (07/13/16)  - abnormal (paradoxical) septal motion is present withpreserved LVEF 55-60%.    8. Anxiety/Depression: Ongoing and this has definitely increased w/ his relapsed disease.   - Cont Zoloft and trazadone daily  - Cont Ativan PRN  - Psych to follow as needed     9. Insomnia:ongoing, has failed multiple sleep aids in the past.  No complaints during hospitalization   - Cont Trazodone nightly     10. Peripheral Neuropathy: Ongoing  - Cont Gabapentin 300 mg TID     11. Bone Health: H/o spinal cord compression & diffuse lytic lesions t/o skeleton  - CT Chest (04/23/17) - multiple lytic lesions throughout the skeleton   - Cont Ca/Vit D & zometa   - S/p Zometa 05/24/17    12. Critical Illness Myopathy: Improving  - He is walking and cont his exercises while inpatient     13.  Acute Pain:  He c/o increased lower back pain and pain under right scapula. He is concerned  about increased lesions   - Consider repeat imaging if increases     14.  Steroid-induced hyperglycemia: Ongoing   - Cont accu-checks and low-dose Lispro sliding scale AC&HS      - DVT Prophylaxis: Platelets <50,000 cells/dL - prophylactic lovenox on hold and mechanical prophylaxis with bilateral SCDs while in bed in place.  Contraindications to pharmacologic prophylaxis: Thrombocytopenia  Contraindications to mechanical prophylaxis: None    - Disposition: Plan for discharge late on Saturday after completion of chemo.  He will need Neulasta on Monday, refusing OBI       Megan M Earhart, APRN - CNP     Marianne Sofia, MD

## 2017-05-28 NOTE — Discharge Instructions (Signed)
[facility] Discharge Instructions    Call for Questions/Concerns:  [phone] [facility] office  The phone number listed above is available 24 hrs/7 days per week  [facility] is open M-F 8am-4:30pm; Sat-Sun/Holidays 8am-1pm    Symptoms to Report Immediately:     Fever of 100.5 or greater   Vomiting without relief after use of anti-nausea medication   Severe abdominal cramping   Diarrhea: More than 3 loose, watery bowel movements in a 24 hour period   Unusual or excessive bleeding from your mouth, nose, rectum, bladder    Sudden onset of shortness of breath or chest pain   Signs/symptoms of infection: redness, warmth, swelling-particularly to central line site    Report to Physician's office within 24 hours:     Pain not relieved by pain medication   Change in urination-odor, cloudiness, frequency, or pain with urination   Flu-like symptoms   Skin changes-rash, hives, redness or peeling of skin    Additional Instructions:     Avoid people with colds, flu-like symptoms, or any sign of infection   Drink plenty of fluids-attempt to consume 2-3 liters (60-100 ounces) of fluids/24 hour period   Continue low microbial diet until instructed by physician to resume normal diet   Bring all of your medications with you to your doctor's appointments   Bring your current medicine list to each hospital and office visit    [place]:    You are being discharged with IV access due to need for ongoing therapy.  Below is pertinent information regarding your IV that your next provider may need to know:  Type:  Right Basilic Double Lumen PICC                    Date of placement:  04/26/17  Surgeon:  Interventional Radiology  Plan:continue for now  Next dressing change due on: ***  Cap changes due on: ***  CVC care and maintenance was reviewed with patient and {Blank multiple:[id number]::"spouse","family members","caregiver"}.  Pt verbalizes understanding of line care and maintenance.       05/25/2017 11:19 AM  David Watkins            My Discharge Checklist    Here at the [facility], we want to make sure you have the help you will need once you leave the hospital.  We are going to go over your discharge instructions with you. We give these to you in writing so you will have a reference if you have questions about symptoms or problems to look for after you leave the hospital.     We know you want to feel better and get home soon. Please answer these questions so we can be sure you have what you need, your questions are answered, and you feel prepared for discharge.    Yes No Do you understand your diagnosis?  Yes No Do you know when and who you need to follow up with?  Yes No Do you feel ready to go home & take care of your daily needs?  Yes No Do you have the help you need at home?  Yes No Do you understand what medications your are taking?  Yes No Do you understand what your medications are for?  Yes No Do you understand what medication side effects to watch for?  Yes No Do you know what symptoms or health problems that require an immediate call to your physician?  Yes No Do you feel ready for discharge?  Yes No Are there any questions that you have re: how to care for yourself at home?  Yes No Do you know about MyChart?    If you have any questions after you get home, feel free to call the unit and ask to speak with your nurse.  In about 7-10 days you will receive a survey. We value your opinion and hope that you have received care that will enable you to choose the best scores when completing the survey.    It was our pleasure to take care of you,  [facility]           [phone]                                                     Outpatient Infusion [phone]    [facility] Physician Office [place] or Procedural Scheduling [phone] ([phone])

## 2017-05-28 NOTE — Care Coordination-Inpatient (Signed)
Type of Admission  Multiple Myeloma IgG   Course #2 Day # 4 DCEP    Central venous catheter  Right Basilic Double Lumen PICC( 04/26/17, Interventional Radiology)    Plan  Proceed with Course #2 of DCEP. Hoping for eventual enrollment in CAR-t study in another facility out of town    Update  05/25/17:  Planned admission for 2nd course of DCEP. States he does not want Neulasta OBI after this course, "I don't like somehting sticking our of my arm", will inform Dr. Hunt Oris  9/27: Tolerating therapy well.   9/28: Doing well. He opened a conversation about his disease, and admits to fear, since he was informed his options are fewer and fewer. He admits he had not been resting well, thinking only about dying. He states the Zoloft has been very helpful with this. But he is "worried about how all this will turn out".    Education  05/25/17:  Reviewed treatment calendar with patient  9/27: Informed TC of his hope to go to [facility] for CAR-t study. Explained timeline of this present treatment, followed by travel to [place] for study.   9/28: Allowed verbalization of feelings. Offered support and assured information as he proceeds.     Discharge  Home Saturday evening at complettion of chemo.  [facility] appt Monday morning for HFU, Neulasta and to check labs.   Lives in [place], [place], with [place], [place].     Pending  .

## 2017-05-29 LAB — CBC WITH AUTO DIFFERENTIAL
Basophils %: 0.2 %
Basophils Absolute: 0 10*3/uL (ref 0.0–0.2)
Eosinophils %: 0 %
Eosinophils Absolute: 0 10*3/uL (ref 0.0–0.6)
Hematocrit: 23.9 % — ABNORMAL LOW (ref 40.5–52.5)
Hemoglobin: 8.4 g/dL — ABNORMAL LOW (ref 13.5–17.5)
Lymphocytes %: 4.4 %
Lymphocytes Absolute: 0.2 10*3/uL — ABNORMAL LOW (ref 1.0–5.1)
MCH: 30.4 pg (ref 26.0–34.0)
MCHC: 35.3 g/dL (ref 31.0–36.0)
MCV: 86 fL (ref 80.0–100.0)
MPV: 7.9 fL (ref 5.0–10.5)
Monocytes %: 7.9 %
Monocytes Absolute: 0.3 10*3/uL (ref 0.0–1.3)
Neutrophils %: 87.5 %
Neutrophils Absolute: 3 10*3/uL (ref 1.7–7.7)
Platelets: 28 10*3/uL — ABNORMAL LOW (ref 135–450)
RBC: 2.77 M/uL — ABNORMAL LOW (ref 4.20–5.90)
RDW: 14.9 % (ref 12.4–15.4)
WBC: 3.4 10*3/uL — ABNORMAL LOW (ref 4.0–11.0)

## 2017-05-29 LAB — HEPATIC FUNCTION PANEL
ALT: 75 U/L — ABNORMAL HIGH (ref 10–40)
AST: 48 U/L — ABNORMAL HIGH (ref 15–37)
Albumin: 3.7 g/dL (ref 3.4–5.0)
Alkaline Phosphatase: 218 U/L — ABNORMAL HIGH (ref 40–129)
Bilirubin, Direct: <0.2 mg/dL (ref 0.0–0.3)
Total Bilirubin: 0.4 mg/dL (ref 0.0–1.0)
Total Protein: 6.2 g/dL — ABNORMAL LOW (ref 6.4–8.2)

## 2017-05-29 LAB — BASIC METABOLIC PANEL
Anion Gap: 13 (ref 3–16)
BUN: 22 mg/dL — ABNORMAL HIGH (ref 7–20)
CO2: 22 mmol/L (ref 21–32)
Calcium: 7.3 mg/dL — ABNORMAL LOW (ref 8.3–10.6)
Chloride: 103 mmol/L (ref 99–110)
Creatinine: 0.8 mg/dL — ABNORMAL LOW (ref 0.9–1.3)
GFR African American: >60 (ref >60)
GFR Non-African American: >60 (ref >60)
Glucose: 193 mg/dL — ABNORMAL HIGH (ref 70–99)
Potassium: 4.2 mmol/L (ref 3.5–5.1)
Sodium: 138 mmol/L (ref 136–145)

## 2017-05-29 LAB — PHOSPHORUS: Phosphorus: 3.6 mg/dL (ref 2.5–4.9)

## 2017-05-29 LAB — URIC ACID: Uric Acid, Serum: 3.4 mg/dL — ABNORMAL LOW (ref 3.5–7.2)

## 2017-05-29 LAB — POCT GLUCOSE
POC Glucose: 188 mg/dl — ABNORMAL HIGH (ref 70–99)
POC Glucose: 100 mg/dl — ABNORMAL HIGH (ref 70–99)
POC Glucose: 85 mg/dl (ref 70–99)
POC Glucose: 83 mg/dl (ref 70–99)

## 2017-05-29 LAB — LACTATE DEHYDROGENASE: LD: 187 U/L (ref 100–190)

## 2017-05-29 MED ORDER — ONDANSETRON HCL 4 MG PO TABS
4 MG | ORAL_TABLET | Freq: Four times a day (QID) | ORAL | 3 refills | Status: DC | PRN
Start: 2017-05-29 — End: 2017-11-29

## 2017-05-29 MED ORDER — SODIUM CHLORIDE 0.9 % IV SOLN
0.9 % | INTRAVENOUS | Status: DC
Start: 2017-05-29 — End: 2017-05-29

## 2017-05-29 MED ORDER — ONDANSETRON HCL 4 MG PO TABS
4 MG | ORAL_TABLET | Freq: Four times a day (QID) | ORAL | 2 refills | Status: DC | PRN
Start: 2017-05-29 — End: 2018-04-19

## 2017-05-29 MED ORDER — HEPARIN SOD (PORK) LOCK FLUSH 100 UNIT/ML IV SOLN
100 UNIT/ML | INTRAVENOUS | Status: DC | PRN
Start: 2017-05-29 — End: 2017-05-29
  Administered 2017-05-29: 23:00:00 600 [IU]

## 2017-05-29 MED FILL — LORAZEPAM 0.5 MG PO TABS: 0.5 MG | ORAL | Qty: 1

## 2017-05-29 MED FILL — PANTOPRAZOLE SODIUM 40 MG PO TBEC: 40 MG | ORAL | Qty: 1

## 2017-05-29 MED FILL — SERTRALINE HCL 50 MG PO TABS: 50 MG | ORAL | Qty: 1

## 2017-05-29 MED FILL — TRAZODONE HCL 50 MG PO TABS: 50 MG | ORAL | Qty: 1

## 2017-05-29 MED FILL — HEPARIN LOCK FLUSH 100 UNIT/ML IV SOLN: 100 UNIT/ML | INTRAVENOUS | Qty: 10

## 2017-05-29 MED FILL — SODIUM CHLORIDE 0.9 % IV SOLN: 0.9 % | INTRAVENOUS | Qty: 1000

## 2017-05-29 NOTE — Discharge Summary (Signed)
[facility] Progress Note    07/19/2017     David Watkins    MRN: [id number]    DOB: 1956-03-18    Referring MD: Eulis Canner, [place] [address]    SUBJECTIVE:  Tolerating chemo well.    ECOG PS:  (1) Restricted in physically strenuous activity, ambulatory and able to do work of light nature    Isolation: None    Medications    Scheduled Meds:    Continuous Infusions:    PRN Meds:.    ROS   Constitutional: Denies fever, sweats, weight loss.     Eyes: No visual changes or diplopia. No scleral icterus.   ENT: No Headaches, hearing loss or vertigo. No mouth sores or sore throat.   Cardiovascular: No chest pain, dyspnea on exertion, palpitations or loss of consciousness.    Respiratory: No cough or wheezing, no sputum production. No hemoptysis.   Gastrointestinal: No abdominal pain, appetite loss, blood in stools. No change in bowel habits.   Genitourinary: No dysuria, trouble voiding, or hematuria.   Musculoskeletal:  No joint complaints.   Integumentary: No rash or pruritis.   Neurological: No headache, diplopia. No change in gait, balance, or coordination. No paresthesias.   Endocrine: No temperature intolerance. No excessive thirst, fluid intake, or urination.    Hematologic/Lymphatic: No abnormal bruising or ecchymoses, blood clots or swollen lymph nodes.   Allergic/Immunologic: No nasal congestion or hives.     Physical Exam:     I&O:    No intake or output data in the 24 hours ending 07/19/17 0902    Vital Signs:  BP 128/80   Pulse 69   Temp 98.2 F (36.8 C) (Oral) Comment: chemo complete  Resp 18   Ht 5' 9" (1.753 m)   Wt 181 lb 12.8 oz (82.5 kg)   SpO2 99%   BMI 26.85 kg/m     Weight:    Wt Readings from Last 3 Encounters:   06/21/17 180 lb (81.6 kg)   05/29/17 181 lb 12.8 oz (82.5 kg)   05/01/17 182 lb (82.6 kg)         General: Awake, alert and oriented.  HEENT: normocephalic, PERRL, no scleral erythema or icterus, Oral mucosa moist and intact, throat  clear  NECK: supple without palpable adenopathy  BACK: Straight negative CVAT  SKIN: warm dry and intact without lesions rashes or masses  CHEST: CTA bilaterally without use of accessory muscles  CV: Normal S1 S2, RRR, no MRG  ABD: NT ND normoactive BS, no palpable masses or hepatosplenomegaly  EXTREMITIES: without edema, denies calf tenderness  NEURO: CN II - XII grossly intact  CATHETER: RUE DL PICC (04/26/17 - IR) - site CDI    Data    CBC:   No results for input(s): WBC, HGB, HCT, MCV, PLT in the last 72 hours.  BMP/Mag:  No results for input(s): NA, K, CL, CO2, PHOS, BUN, CREATININE, MG in the last 72 hours.    Invalid input(s): CA  LIVP:   No results for input(s): AST, ALT, LIPASE, BILIDIR, BILITOT, ALKPHOS in the last 72 hours.    Invalid input(s):  AMYLASE,  ALB  Coags:   No results for input(s): PROTIME, INR, APTT in the last 72 hours.  Uric Acid   No results for input(s): LABURIC in the last 72 hours.  DIAGNOSTIC IMAGING:  1. CT Chest 04/23/17:  1. Redemonstration of multiple lytic lesions throughout the skeleton compatible  with patient's history of multiple myeloma.   2. 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 Stable scattered submillimeter pulmonary parenchymal nodules.   4. Marked centrilobular emphysematous changes with upper lobe predominance, stable.   5. Progression of splenomegaly       PATHOLOGY:  1. BM Bx/Asp 04/24/17  Bone marrow biopsy, clot section, aspirate smear, touch imprint,  peripheral blood smear and flow cytometry data:  Plasma cell leukemia.    There is a single continuous sheet of atypical plasma cells  (shown to be kappa monotypic via flow cytometry) effacing the bone  marrow. The cellularity is 100% and there is minimal evidence of  normal-appearing hematopoietic activity. Genetic studies are pending.  The morphologic features are most consistent with the  plasmablastic  variant. There is a patchy 3+ increase in reticulin fibers.    2. Myeloma Studies 04/24/17:  SPEP/SIFE: monoclonal IgG kappa by immunofixation(6.65 g/dl)  SFLCs:   Free Kappa Light Chains 637  Free Lambda Light Chain 20.85  Free Kappa/Lambda Ratio 30.55  Immunoglobulins: IgA 106, IgG 6379, IgM 29  B2: 16.5  UPEP/UIFE: Urine IFE shows a polyclonal increase in free kappa and/or   free lambda light chains. No monoclonal free light chains   (Bence Jones Protein) are detected. Urine IFE shows a   monoclonal IgG heavy chain with associated kappa light   chain.   UFLCs:   Free Urinary Kappa Light Chains 150.00 H mg/dL   Free Urinary Lambda Light Chain 9.06 H mg/dL   Free Urinary Kappa/Lambda Ratio 16.56 H ratio     Myeloma Labs (05/20/17):  SFLC (05/20/17):  Kappa - 23.80  Lambda - 24.70  Ratio - 0.96    Immunoglobulins (05/20/17):  IgG - 1270  IgA - 117  IgM - 25    SPEP / SIFE (05/20/17):  M-spike:  0.8 g/dL  Serum immunofixation electrophoresis reveals two M-spikes, one of which is recent.  Persistent is M2 in mid-to-slow gamma, a monoclonal IgG kappa. The recent M-spike, M1, is minor monoclonal IgG lambda in mid-gamma; M1 isnot visible by SPE, only by IFE.    Beta 2 Microglobulin (05/20/17);  2.19    PROBLEM LIST:      IgG Kappa Multiple myeloma  Peripheral neuropathy  Depression  Erectile dysfunction  Hyperlipidemia  Hypertension  Insomnia  Low back pain - h/o lytic lesions as well as cord compression at T6 and T11    TREATMENT:      1. Rad Tx to T5-7, T10-L3, Right Scapula - 3000 cGy - Dr. Jamas Lav 03/20/16-04/01/16  2. RVD x1 03/20/16 - discontinued d/t rash   3. Velcade/Pomalyst/Dex x3 cycles 04/23/16-06/17/16 - discontinued d/t reaction to pomalyst  4. Velcade/Dex x 1 cycle 06/26/16 (last dose of dexamethasone 07/22/16  5. High-dose melphalan followed by administration of PBSCs 2.36 x10^6 cd34cells/kg on 08/28/16  6. Maintenance  Revlimid 56m daily (12/2016-04/23/17)  7. Dexamethasone 445mdaily (04/24/17)  8. DCEP    Cycle #1 - 04/26/17   Cycle #2 - 05/25/17    ASSESSMENT AND PLAN:     1. IgG kappa multiple myeloma / Plasma Cell Leukemia: [place] relapse of his disease in 03/2017 w/ progressive anorexia & weakness, 17 lb wt loss & severe thrombocytopenia   - Good response after 1 cycle of DCEP, see labs above. Monoclonal protein down to 0.8 g/dL from 6.6.5 g/dL    Plan:  second  cycle of DCEP, then possibly transition to Dara/Pom/Dex, after consultation at [facility] for possible CAR-T.     DCEP Cycle 2, Day + 5    2. ID: Afebrile and w/out evidence of infection  - Cont Valtrex prophylaxis for 1 year post tx    3. Heme: Thrombocytopenia and anemia d/t chemotherapy  - Transfuse for Hgb < 7 and Platelets < 10K  - No transfusion today  - Neulasta, refusing OBI at d/c     4. Metabolic: Stable renal fxn and e-lytes, steroid-induced hyperglycemia   - Cont NS @ 100 mL/hr  - Replace Mg+ and K+ per protocol    5. Pulmonary: No acute issues   - Chronic Bronchitis/COPD & pulmonary nodule.   - Pulm Nodule: Stable on repeat CT chest (04/23/17); cont to monitor.   - Cont albuterol & pulmicort    6. GI/Nutrition: Appetite and oral intake is good   - Cont low microbial diet   - Dietary to follow as needed     7. 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  - Hold Lipitor while inpatient for chemotherapy and resume on d/c  - Echo (07/13/16)  - abnormal (paradoxical) septal motion is present withpreserved LVEF 55-60%.    8. Anxiety/Depression: Ongoing and this has definitely increased w/ his relapsed disease.   - Cont Zoloft and trazadone daily  - Cont Ativan PRN  - Psych to follow as needed     9. Insomnia:ongoing, has failed multiple sleep aids in the past.  No complaints during hospitalization   - Cont Trazodone nightly     10. Peripheral Neuropathy: Ongoing  - Cont Gabapentin 300 mg TID     11. Bone Health:  H/o spinal cord compression & diffuse lytic lesions t/o skeleton  - CT Chest (04/23/17) - multiple lytic lesions throughout the skeleton   - Cont Ca/Vit D & zometa   - S/p Zometa 05/24/17    12. Critical Illness Myopathy: Improving  - He is walking and cont his exercises while inpatient     13.  Acute Pain:  He c/o increased lower back pain and pain under right scapula. He is concerned about increased lesions   - Consider repeat imaging if increases     14.  Steroid-induced hyperglycemia: Ongoing   - Cont accu-checks and low-dose Lispro sliding scale AC&HS    - DVT Prophylaxis: Platelets <50,000 cells/dL - prophylactic lovenox on hold and mechanical prophylaxis with bilateral SCDs while in bed in place.  Contraindications to pharmacologic prophylaxis: Thrombocytopenia  Contraindications to mechanical prophylaxis: None    - Disposition: Plan for discharge today (9/29)     Marianne Sofia, MD

## 2017-05-29 NOTE — Plan of Care (Signed)
Problem: Falls - Risk of:  Goal: Will remain free from falls  Will remain free from falls   Outcome: Met This Shift  Orthostatic vital signs obtained at start of shift - see flowsheet for details.  Pt meets criteria for orthostasis.  Pt is a Med fall risk. See Leamon Arnt Fall Score and ABCDS Injury Risk assessments.   - Screening for Orthostasis AND not a High Falls Risk per MORSE/ABCDS: Pt bed is in low position, side rails up, call light and belongings are in reach.  Fall risk light is on outside pts room.  Pt encouraged to call for assistance as needed. Will continue with hourly rounds for PO intake, pain needs, toileting and repositioning as needed.     Problem: Bleeding:  Goal: Will show no signs and symptoms of excessive bleeding  Will show no signs and symptoms of excessive bleeding   Outcome: Met This Shift  Patient's hemoglobin this AM:   Recent Labs      05/29/17   0315   HGB  8.4*     Patient's platelet count this AM:   Recent Labs      05/29/17   0315   PLT  28*    Thrombocytopenia Precautions in place.  Patient showing no signs or symptoms of active bleeding.  Transfusion not indicated at this time.  Patient verbalizes understanding of all instructions. Will continue to assess and implement POC. Call light within reach and hourly rounding in place.     Problem: Venous Thromboembolism:  Goal: Will show no signs or symptoms of venous thromboembolism  Will show no signs or symptoms of venous thromboembolism   Outcome: Met This Shift  Refusing DVT Prevention: Pt is at risk for DVT d/t decreased mobility and cancer treatment.  Pt educated on importance of activity. Pt has orders for SCDs while in bed, however pt currently refusing treatment.  Reviewed risks of DVT & PE development while inpatient.   Dr. Bella Kennedy notified of patient's refusal and re-education of importance of prophylaxis.  No new orders at this time.  Will continue to re-instruct patient and intervene as appropriate.    Problem: Infection - Central  Venous Catheter-Associated Bloodstream Infection:  Goal: Will show no infection signs and symptoms  Will show no infection signs and symptoms   Outcome: Met This Shift  CVC site remains free of signs/symptoms of infection. No drainage, edema, erythema, pain, or warmth noted at site. Dressing changes continue per protocol and on an as needed basis - see flowsheet.     States he will shower later today.    Problem: PROTECTIVE PRECAUTIONS  Goal: Patient will remain free of nosocomial Infections  Outcome: Met This Shift  Pt remains in protective precautions.  Pt educated on wearing mask when in hallways. Pt, staff, and visitors adhering to handwashing guidelines. Pt educated to shower or bathe daily with chlorhexidine and linens changed daily per protocol. Pt verbalizes understanding of low microbial diet. Will continue to monitor.     Problem: Anxiety:  Goal: Level of anxiety will decrease  Level of anxiety will decrease   Outcome: Ongoing  No s/sx anxiety at this time.

## 2017-05-29 NOTE — Plan of Care (Signed)
Problem: Falls - Risk of:  Goal: Absence of physical injury  Absence of physical injury   Outcome: Ongoing  Orthostatic vital signs obtained at start of shift - see flowsheet for details.  Pt does not meet criteria for orthostasis.  Pt is a Med fall risk. See Leamon Arnt Fall Score and ABCDS Injury Risk assessments.   - Screening for Orthostasis AND not a High Falls Risk per MORSE/ABCDS: Pt bed is in low position, side rails up, call light and belongings are in reach.  Fall risk light is on outside pts room.  Pt encouraged to call for assistance as needed. Will continue with hourly rounds for PO intake, pain needs, toileting and repositioning as needed.     Problem: Bleeding:  Goal: Will show no signs and symptoms of excessive bleeding  Will show no signs and symptoms of excessive bleeding   Outcome: Ongoing  Patient's hemoglobin this AM:   Recent Labs      05/28/17   0335   HGB  7.7*     Patient's platelet count this AM:   Recent Labs      05/28/17   0335   PLT  21*    Thrombocytopenia Precautions in place.  Patient showing no signs or symptoms of active bleeding.  Transfusion not indicated at this time.  Patient verbalizes understanding of all instructions. Will continue to assess and implement POC. Call light within reach and hourly rounding in place.     Problem: Venous Thromboembolism:  Goal: Will show no signs or symptoms of venous thromboembolism  Will show no signs or symptoms of venous thromboembolism   Outcome: Ongoing  Refusing DVT Prevention: Pt is at risk for DVT d/t decreased mobility and cancer treatment.  Pt educated on importance of activity. Pt has orders for SCDs while in bed, however pt currently refusing treatment.  Reviewed risks of DVT & PE development while inpatient.   Provider aware of patient's refusal and re-education of importance of prophylaxis.  No new orders at this time.  Will continue to re-instruct patient and intervene as appropriate.      Problem: Discharge Planning:  Goal: Discharged  to appropriate level of care  Discharged to appropriate level of care   Outcome: Ongoing  Pt expects to be discharged at completion of chemo tomorrow evening. Pt is aware of POC. Will continue to monitor.    Problem: Infection - Central Venous Catheter-Associated Bloodstream Infection:  Goal: Will show no infection signs and symptoms  Will show no infection signs and symptoms   Outcome: Ongoing  CVC site remains free of signs/symptoms of infection. No drainage, edema, erythema, pain, or warmth noted at site. Dressing changes continue per protocol and on an as needed basis - see flowsheet.           Problem: PROTECTIVE PRECAUTIONS  Goal: Patient will remain free of nosocomial Infections  Outcome: Ongoing  Pt remains in a private room. Pt is compliant with handwashing, low-microbial diet, and bathing per BCC protocol. Pt remains afebrile at this time with stable vital signs. Will continue to monitor.    Problem: Anxiety:  Goal: Level of anxiety will decrease  Level of anxiety will decrease   Outcome: Ongoing  Pt continues to be slightly anxious at times.  Requested an Ativan this evening. Will continue to monitor.

## 2017-05-29 NOTE — Progress Notes (Signed)
[facility] Progress Note    05/29/2017     David Watkins    MRN: [id number]    DOB: [birth year]    Referring MD: Eulis Canner, [place] [address]    SUBJECTIVE:  Tolerating chemo well.    ECOG PS:  (1) Restricted in physically strenuous activity, ambulatory and able to do work of light nature    Isolation: None    Medications    Scheduled Meds:  . insulin lispro  0-6 Units Subcutaneous TID WC   . insulin lispro  0-3 Units Subcutaneous Nightly   . Saline Mouthwash  15 mL Swish & Spit 4x Daily AC & HS   . sodium chloride flush  10 mL Intravenous 2 times per day   . traZODone  50 mg Oral Nightly   . valACYclovir  500 mg Oral BID   . sertraline  50 mg Oral Daily   . pantoprazole  40 mg Oral QAM AC   . gabapentin  300 mg Oral TID   . budesonide  0.5 mg Nebulization BID   . ondansetron  24 mg Oral Q24H   . furosemide  40 mg Intravenous Q12H   . albuterol  2.5 mg Nebulization 4x daily     Continuous Infusions:  . sodium chloride     . dextrose     . sodium chloride     . sodium chloride 1,000 mL (05/29/17 0321)   . cyclophosphamide/etoposide/CISplatin chemo infusion 41.7 mL/hr at 05/28/17 1848     PRN Meds:.medicated lip ointment, glucose, dextrose, glucagon (rDNA), dextrose, sodium chloride, alteplase, magnesium hydroxide, magnesium sulfate, potassium chloride, Saline Mouthwash, sodium chloride flush, prochlorperazine **OR** prochlorperazine, LORazepam **OR** LORazepam    ROS   Constitutional: Denies fever, sweats, weight loss.     Eyes: No visual changes or diplopia. No scleral icterus.   ENT: No Headaches, hearing loss or vertigo. No mouth sores or sore throat.   Cardiovascular: No chest pain, dyspnea on exertion, palpitations or loss of consciousness.    Respiratory: No cough or wheezing, no sputum production. No hemoptysis.   Gastrointestinal: No abdominal pain, appetite loss, blood in stools. No change in bowel habits.   Genitourinary: No dysuria, trouble voiding, or  hematuria.   Musculoskeletal:  No joint complaints.   Integumentary: No rash or pruritis.   Neurological: No headache, diplopia. No change in gait, balance, or coordination. No paresthesias.   Endocrine: No temperature intolerance. No excessive thirst, fluid intake, or urination.    Hematologic/Lymphatic: No abnormal bruising or ecchymoses, blood clots or swollen lymph nodes.   Allergic/Immunologic: No nasal congestion or hives.     Physical Exam:     I&O:      Intake/Output Summary (Last 24 hours) at 05/29/17 1410  Last data filed at 05/29/17 0950   Gross per 24 hour   Intake             4062 ml   Output             5000 ml   Net             -938 ml       Vital Signs:  BP 122/87   Pulse 75   Temp 98.6 F (37 C) (Oral)   Resp 18   Ht 5' 9"  (1.753 m)   Wt 181 lb 12.8 oz (82.5 kg)   SpO2 100%   BMI 26.85 kg/m     Weight:  Wt Readings from Last 3 Encounters:   05/29/17 181 lb 12.8 oz (82.5 kg)   05/01/17 182 lb (82.6 kg)   04/05/17 180 lb (81.6 kg)         General: Awake, alert and oriented.  HEENT: normocephalic, PERRL, no scleral erythema or icterus, Oral mucosa moist and intact, throat clear  NECK: supple without palpable adenopathy  BACK: Straight negative CVAT  SKIN: warm dry and intact without lesions rashes or masses  CHEST: CTA bilaterally without use of accessory muscles  CV: Normal S1 S2, RRR, no MRG  ABD: NT ND normoactive BS, no palpable masses or hepatosplenomegaly  EXTREMITIES: without edema, denies calf tenderness  NEURO: CN II - XII grossly intact  CATHETER: RUE DL PICC (04/26/17 - IR) - site CDI    Data    CBC:   Recent Labs      05/27/17   0300  05/28/17   0335  05/29/17   0315   WBC  4.4  3.2*  3.4*   HGB  6.9*  7.7*  8.4*   HCT  19.6*  21.8*  23.9*   MCV  86.2  85.8  86.0   PLT  21*  21*  28*     BMP/Mag:  Recent Labs      05/27/17   0300  05/28/17   0335  05/29/17   0315   NA  138  139  138   K  3.8  3.9  4.2   CL  105  103  103   CO2  19*  22  22   PHOS  2.4*  3.3  3.6   BUN  24*   21*  22*   CREATININE  0.9  0.8*  0.8*   MG  2.00   --    --      LIVP:   Recent Labs      05/27/17   0300  05/28/17   0335  05/29/17   0315   AST  37  35  48*   ALT  40  52*  75*   BILIDIR  <0.2  <0.2  <0.2   BILITOT  0.3  0.4  0.4   ALKPHOS  238*  215*  218*     Coags:   Recent Labs      05/27/17   0300   PROTIME  12.0   INR  1.05   APTT  27.4     Uric Acid   Recent Labs      05/27/17   0300  05/28/17   0335  05/29/17   0315   LABURIC  3.4*  3.2*  3.4*     DIAGNOSTIC IMAGING:  1. CT Chest 04/23/17:  1. Redemonstration of multiple lytic lesions throughout the skeleton compatible with patient's history of multiple myeloma.   2. 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 Stable scattered submillimeter pulmonary parenchymal nodules.   4. Marked centrilobular emphysematous changes with upper lobe predominance, stable.   5. Progression of splenomegaly       PATHOLOGY:  1. BM Bx/Asp 04/24/17  Bone marrow biopsy, clot section, aspirate smear, touch imprint,  peripheral blood smear and flow cytometry data:  Plasma cell leukemia.    There is a single continuous sheet of atypical plasma cells  (shown to be kappa monotypic via flow cytometry) effacing the bone  marrow.  The cellularity is 100% and there is minimal evidence of  normal-appearing hematopoietic activity. Genetic studies are pending.  The morphologic features are most consistent with the plasmablastic  variant. There is a patchy 3+ increase in reticulin fibers.    2. Myeloma Studies 04/24/17:  SPEP/SIFE: monoclonal IgG kappa by immunofixation(6.65 g/dl)  SFLCs:   Free Kappa Light Chains 637  Free Lambda Light Chain 20.85  Free Kappa/Lambda Ratio 30.55  Immunoglobulins: IgA 106, IgG 6379, IgM 29  B2: 16.5  UPEP/UIFE: Urine IFE shows a polyclonal increase in free kappa and/or   free lambda light chains. No monoclonal free light chains   (Bence  Jones Protein) are detected. Urine IFE shows a   monoclonal IgG heavy chain with associated kappa light   chain.   UFLCs:   Free Urinary Kappa Light Chains 150.00 H mg/dL   Free Urinary Lambda Light Chain 9.06 H mg/dL   Free Urinary Kappa/Lambda Ratio 16.56 H ratio     Myeloma Labs (05/20/17):  SFLC (05/20/17):  Kappa - 23.80  Lambda - 24.70  Ratio - 0.96    Immunoglobulins (05/20/17):  IgG - 1270  IgA - 117  IgM - 25    SPEP / SIFE (05/20/17):  M-spike:  0.8 g/dL  Serum immunofixation electrophoresis reveals two M-spikes, one of which is recent.  Persistent is M2 in mid-to-slow gamma, a monoclonal IgG kappa. The recent M-spike, M1, is minor monoclonal IgG lambda in mid-gamma; M1 isnot visible by SPE, only by IFE.    Beta 2 Microglobulin (05/20/17);  2.19    PROBLEM LIST:      IgG Kappa Multiple myeloma  Peripheral neuropathy  Depression  Erectile dysfunction  Hyperlipidemia  Hypertension  Insomnia  Low back pain - h/o lytic lesions as well as cord compression at T6 and T11    TREATMENT:      1. Rad Tx to T5-7, T10-L3, Right Scapula - 3000 cGy - Dr. Jamas Lav 03/20/16-04/01/16  2. RVD x1 03/20/16 - discontinued d/t rash   3. Velcade/Pomalyst/Dex x3 cycles 04/23/16-06/17/16 - discontinued d/t reaction to pomalyst  4. Velcade/Dex x 1 cycle 06/26/16 (last dose of dexamethasone 07/22/16  5. High-dose melphalan followed by administration of PBSCs 2.36 x10^6 cd34cells/kg on 08/28/16  6. Maintenance Revlimid 27m daily (12/2016-04/23/17)  7. Dexamethasone 443mdaily (04/24/17)  8. DCEP    Cycle #1 - 04/26/17   Cycle #2 - 05/25/17    ASSESSMENT AND PLAN:     1. IgG kappa multiple myeloma / Plasma Cell Leukemia: [place] relapse of his disease in 03/2017 w/ progressive anorexia & weakness, 17 lb wt loss & severe thrombocytopenia   - Good response after 1 cycle of DCEP, see labs above. Monoclonal protein down to 0.8 g/dL from 6.6.5 g/dL    Plan:  second cycle of DCEP, then  possibly transition to Dara/Pom/Dex, after consultation at [facility] for possible CAR-T.     DCEP Cycle 2, Day + 5    2. ID: Afebrile and w/out evidence of infection  - Cont Valtrex prophylaxis for 1 year post tx    3. Heme: Thrombocytopenia and anemia d/t chemotherapy  - Transfuse for Hgb < 7 and Platelets < 10K  - No transfusion today  - Neulasta, refusing OBI at d/c     4. Metabolic: Stable renal fxn and e-lytes, steroid-induced hyperglycemia   - Cont NS @ 100 mL/hr  - Replace Mg+ and K+ per protocol    5. Pulmonary: No acute issues   - Chronic  Bronchitis/COPD & pulmonary nodule.   - Pulm Nodule: Stable on repeat CT chest (04/23/17); cont to monitor.   - Cont albuterol & pulmicort    6. GI/Nutrition: Appetite and oral intake is good   - Cont low microbial diet   - Dietary to follow as needed     7. 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  - Hold Lipitor while inpatient for chemotherapy and resume on d/c  - Echo (07/13/16)  - abnormal (paradoxical) septal motion is present withpreserved LVEF 55-60%.    8. Anxiety/Depression: Ongoing and this has definitely increased w/ his relapsed disease.   - Cont Zoloft and trazadone daily  - Cont Ativan PRN  - Psych to follow as needed     9. Insomnia:ongoing, has failed multiple sleep aids in the past.  No complaints during hospitalization   - Cont Trazodone nightly     10. Peripheral Neuropathy: Ongoing  - Cont Gabapentin 300 mg TID     11. Bone Health: H/o spinal cord compression & diffuse lytic lesions t/o skeleton  - CT Chest (04/23/17) - multiple lytic lesions throughout the skeleton   - Cont Ca/Vit D & zometa   - S/p Zometa 05/24/17    12. Critical Illness Myopathy: Improving  - He is walking and cont his exercises while inpatient     13.  Acute Pain:  He c/o increased lower back pain and pain under right scapula. He is concerned about increased lesions   - Consider repeat imaging if increases     14.  Steroid-induced  hyperglycemia: Ongoing   - Cont accu-checks and low-dose Lispro sliding scale AC&HS    - DVT Prophylaxis: Platelets <50,000 cells/dL - prophylactic lovenox on hold and mechanical prophylaxis with bilateral SCDs while in bed in place.  Contraindications to pharmacologic prophylaxis: Thrombocytopenia  Contraindications to mechanical prophylaxis: None    - Disposition: Plan for discharge late on Saturday after completion of chemo.  He will need Neulasta on Monday, refusing OBI     Marianne Sofia, MD

## 2017-05-29 NOTE — Progress Notes (Signed)
Reviewed discharge instructions with patient and  spouse.  Reviewed discharge medications including dosing, schedule, indication, and adverse reactions.  Reviewed which medications were already taken today and next dosage due for each medication.      Reviewed signs and symptoms that prompt a call to the physician and appropriate phone numbers. [facility] ER card given to the patient with explanations of its use.  Reviewed follow up appointments that have been made in [facility] and Outpatient Oncology.  Low microbial diet, activity restrictions, and increased risk of infection were reviewed.     Patient is being discharged with IV access d/t need for ongoing therapy:      Type:  Right PICC                        Plan:Continue  Next dressing change due on: 06/01/2017  Cap changes due on: 05/31/2017  CVC care and maintenance was reviewed with patient and spouse.  Pt verbalizes understanding of line care and maintenance.      Patient verbalized understanding of all instructions and questions were answered to his. satisfaction.  Signed discharge instructions were given to the patient and a copy placed in the paper-lite chart.  Patient discharged to home per self with spouse.      Brendolyn Patty

## 2017-05-31 LAB — EKG 12-LEAD
Atrial Rate: 85 {beats}/min
P Axis: 76 degrees
P-R Interval: 160 ms
Q-T Interval: 372 ms
QRS Duration: 94 ms
QTc Calculation (Bazett): 442 ms
R Axis: 73 degrees
T Axis: 62 degrees
Ventricular Rate: 85 {beats}/min

## 2017-06-02 ENCOUNTER — Inpatient Hospital Stay: Admit: 2017-06-02 | Discharge: 2017-06-02 | Payer: BLUE CROSS/BLUE SHIELD | Primary: Hematology & Oncology

## 2017-06-02 DIAGNOSIS — C9002 Multiple myeloma in relapse: Secondary | ICD-10-CM

## 2017-06-02 LAB — PREPARE PLATELETS: Dispense Status Blood Bank: TRANSFUSED

## 2017-06-02 MED ORDER — HEPARIN SOD (PORK) LOCK FLUSH 100 UNIT/ML IV SOLN
100 UNIT/ML | INTRAVENOUS | Status: DC | PRN
Start: 2017-06-02 — End: 2017-06-03
  Administered 2017-06-02: 16:00:00 300 [IU]

## 2017-06-02 MED FILL — HEPARIN LOCK FLUSH 100 UNIT/ML IV SOLN: 100 UNIT/ML | INTRAVENOUS | Qty: 5

## 2017-06-02 NOTE — Progress Notes (Signed)
Patient's hemoglobin this AM: 9.2 g/dL.  Patient's platelet count this AM: 13.0 k/uL.    Pt seen and assessed at [facility].  Seen at [place] today for platelet per standing orders for above lab values.  Blood products transfused per [facility] policy.  Pt tolerated transfusion well and without incident.  Pt verbalizes understanding of discharge instructions.  Discharged ambulatory to home.

## 2017-06-02 NOTE — Plan of Care (Signed)
Problem: KNOWLEDGE DEFICIT  Goal: Patient/S.O. demonstrates understanding of disease process, treatment plan, medications, and discharge instructions.  Outcome: Met This Shift      Problem: DISCHARGE BARRIERS  Goal: Patient's continuum of care needs are met  Outcome: Met This Shift

## 2017-06-04 ENCOUNTER — Inpatient Hospital Stay: Admit: 2017-06-04 | Payer: BLUE CROSS/BLUE SHIELD | Primary: Hematology & Oncology

## 2017-06-04 DIAGNOSIS — C9002 Multiple myeloma in relapse: Secondary | ICD-10-CM

## 2017-06-04 LAB — PREPARE PLATELETS: Dispense Status Blood Bank: TRANSFUSED

## 2017-06-04 MED ORDER — HEPARIN SOD (PORK) LOCK FLUSH 100 UNIT/ML IV SOLN
100 UNIT/ML | INTRAVENOUS | Status: DC | PRN
Start: 2017-06-04 — End: 2017-06-05
  Administered 2017-06-04: 15:00:00 300 [IU]

## 2017-06-04 NOTE — Progress Notes (Signed)
Patient's hemoglobin this AM: 8.0 g/dL.  Patient's platelet count this AM: 16.0 k/uL.  Pt seen and assessed at [facility].  Seen at [place] today for platelet transfusion per standing orders for above lab values.  Blood products transfused per [facility] policy.  Pt tolerated transfusion well and without incident.  Pt verbalizes understanding of discharge instructions.  Discharged ambulatory to home.

## 2017-06-04 NOTE — Plan of Care (Signed)
Problem: KNOWLEDGE DEFICIT  Goal: Patient/S.O. demonstrates understanding of disease process, treatment plan, medications, and discharge instructions.  Outcome: Ongoing      Problem: DISCHARGE BARRIERS  Goal: Patient's continuum of care needs are met  Outcome: Ongoing

## 2017-06-07 ENCOUNTER — Inpatient Hospital Stay: Admit: 2017-06-07 | Payer: BLUE CROSS/BLUE SHIELD | Primary: Hematology & Oncology

## 2017-06-07 DIAGNOSIS — C9002 Multiple myeloma in relapse: Secondary | ICD-10-CM

## 2017-06-07 LAB — PREPARE RBC (CROSSMATCH): Dispense Status Blood Bank: TRANSFUSED

## 2017-06-07 LAB — TYPE AND SCREEN
ABO/Rh: O POS
Antibody Screen: NEGATIVE

## 2017-06-07 MED ORDER — HEPARIN SOD (PORK) LOCK FLUSH 100 UNIT/ML IV SOLN
100 UNIT/ML | INTRAVENOUS | Status: DC | PRN
Start: 2017-06-07 — End: 2017-06-08
  Administered 2017-06-07: 17:00:00 300 [IU]

## 2017-06-07 MED FILL — HEPARIN LOCK FLUSH 100 UNIT/ML IV SOLN: 100 UNIT/ML | INTRAVENOUS | Qty: 5

## 2017-06-07 NOTE — Progress Notes (Signed)
Patient's hemoglobin this AM: 6.9 g/dL.  Patient's platelet count this AM: 20.0 k/uL.  Pt seen and assessed at [facility].  Seen at [place] today for PRBC transfusion per standing orders for above lab values.  Blood products transfused per [facility] policy.  Pt tolerated transfusion well and without incident.  Pt verbalizes understanding of discharge instructions.  Discharged ambulatory to home with wife.

## 2017-06-10 ENCOUNTER — Inpatient Hospital Stay: Admit: 2017-06-10 | Payer: BLUE CROSS/BLUE SHIELD | Primary: Hematology & Oncology

## 2017-06-10 DIAGNOSIS — C9000 Multiple myeloma not having achieved remission: Secondary | ICD-10-CM

## 2017-06-10 LAB — PREPARE PLATELETS: Dispense Status Blood Bank: TRANSFUSED

## 2017-06-10 MED ORDER — HEPARIN SOD (PORK) LOCK FLUSH 100 UNIT/ML IV SOLN
100 UNIT/ML | INTRAVENOUS | Status: DC | PRN
Start: 2017-06-10 — End: 2017-06-11
  Administered 2017-06-10: 19:00:00 500 [IU]

## 2017-06-10 NOTE — Plan of Care (Signed)
Problem: KNOWLEDGE DEFICIT  Goal: Patient/S.O. demonstrates understanding of disease process, treatment plan, medications, and discharge instructions.  Outcome: Ongoing  Patient's hemoglobin this AM:7.6  Patient's platelet count this AM:10  Pt seen and assessed at [facility].  Seen at [place] today for 1 unit of SDP per MD orders for above lab values.  Blood products transfused per [facility] policy.  Pt tolerated transfusion well and without incident. Bleeding precautions reviewed with pt, pt verbalizes understanding of discharge instructions.  Discharged ambulatory to home with brother.

## 2017-06-18 NOTE — Progress Notes (Signed)
Left a message regarding procedure 06-21-17.  Arrive 0900 for a 1030 am procedure NPo after mn and t obring a responsible adult to drive home  Harmon Pier

## 2017-06-20 ENCOUNTER — Inpatient Hospital Stay: Admit: 2017-06-20 | Payer: BLUE CROSS/BLUE SHIELD | Primary: Hematology & Oncology

## 2017-06-20 DIAGNOSIS — C9000 Multiple myeloma not having achieved remission: Secondary | ICD-10-CM

## 2017-06-20 LAB — PREPARE PLATELETS: Dispense Status Blood Bank: TRANSFUSED

## 2017-06-20 MED ORDER — HEPARIN SOD (PORK) LOCK FLUSH 100 UNIT/ML IV SOLN
100 UNIT/ML | INTRAVENOUS | Status: DC | PRN
Start: 2017-06-20 — End: 2017-06-21
  Administered 2017-06-20: 17:00:00 500 [IU]

## 2017-06-20 MED ORDER — DIPHENHYDRAMINE HCL 25 MG PO TABS
25 MG | Freq: Once | ORAL | Status: AC
Start: 2017-06-20 — End: 2017-06-20
  Administered 2017-06-20: 16:00:00 25 mg via ORAL

## 2017-06-20 MED FILL — HEPARIN LOCK FLUSH 100 UNIT/ML IV SOLN: 100 UNIT/ML | INTRAVENOUS | Qty: 5

## 2017-06-20 NOTE — Progress Notes (Signed)
D: Pt here to receive 1 unit platelet transfusion for plt count of 45 per [facility], pt will receive port placement tomorrow in OR, requires platelets to be >50. Per pt "I've had hives once immediately after getting platelets and again after I got home after last platelet transfusion". No standing order for pre-medication in blood administration therapy plan.  A: Notified MD Derrill Kay, received telephone order with readback for Benadryl, 25mg  PO one time 30 minutes prior to platelet transfusion.  R: Will monitor for reaction to platelet transfusion after pre-medication is administered.

## 2017-06-20 NOTE — Plan of Care (Signed)
Problem: Bleeding:  Goal: Will show no signs and symptoms of excessive bleeding  Will show no signs and symptoms of excessive bleeding   Outcome: Ongoing  Patient's hemoglobin this AM: 7.1  Patient's platelet count this AM: 45  Pt seen and assessed at [facility].  Seen at [place] today for 1 unit Platelet transfusion per standing orders for above lab values per [facility], pt to receive port placement and removal of Right PICC in OR tomorrow morning.  Blood product transfused per [facility] policy.  Pt tolerated transfusion well and without incident.  Pt verbalizes understanding of discharge instructions.  Discharged ambulatory to home per self.

## 2017-06-21 ENCOUNTER — Inpatient Hospital Stay: Admit: 2017-06-21 | Payer: BLUE CROSS/BLUE SHIELD | Primary: Hematology & Oncology

## 2017-06-21 ENCOUNTER — Inpatient Hospital Stay: Admit: 2017-06-21 | Payer: BLUE CROSS/BLUE SHIELD | Attending: Diagnostic Radiology | Primary: Hematology & Oncology

## 2017-06-21 DIAGNOSIS — C9000 Multiple myeloma not having achieved remission: Secondary | ICD-10-CM

## 2017-06-21 LAB — CBC
Hematocrit: 19.3 % — CL (ref 40.5–52.5)
Hemoglobin: 6.6 g/dL — CL (ref 13.5–17.5)
MCH: 32 pg (ref 26.0–34.0)
MCHC: 34.5 g/dL (ref 31.0–36.0)
MCV: 92.7 fL (ref 80.0–100.0)
MPV: 7.9 fL (ref 5.0–10.5)
Platelets: 74 10*3/uL — ABNORMAL LOW (ref 135–450)
RBC: 2.08 M/uL — ABNORMAL LOW (ref 4.20–5.90)
RDW: 19.9 % — ABNORMAL HIGH (ref 12.4–15.4)
WBC: 6.7 10*3/uL (ref 4.0–11.0)

## 2017-06-21 LAB — PREPARE RBC (CROSSMATCH): Dispense Status Blood Bank: TRANSFUSED

## 2017-06-21 LAB — PROTIME-INR
INR: 1.08 (ref 0.86–1.14)
Protime: 12.3 s (ref 9.8–13.0)

## 2017-06-21 LAB — TYPE AND SCREEN
ABO/Rh: O POS
Antibody Screen: NEGATIVE

## 2017-06-21 MED ORDER — LIDOCAINE HCL (PF) 1 % IJ SOLN
1 | INTRAMUSCULAR | Status: AC
Start: 2017-06-21 — End: 2017-06-21

## 2017-06-21 MED ORDER — FENTANYL CITRATE (PF) 100 MCG/2ML IJ SOLN
100 | INTRAMUSCULAR | Status: AC
Start: 2017-06-21 — End: 2017-06-21

## 2017-06-21 MED ORDER — MIDAZOLAM HCL 2 MG/2ML IJ SOLN
2 | INTRAMUSCULAR | Status: AC
Start: 2017-06-21 — End: 2017-06-21

## 2017-06-21 MED ORDER — HEPARIN (PORCINE) IN NACL 2-0.9 UNIT/ML-% IJ SOLN
2-0.9 | INTRAMUSCULAR | Status: AC
Start: 2017-06-21 — End: 2017-06-21

## 2017-06-21 MED ORDER — DIPHENHYDRAMINE HCL 50 MG/ML IJ SOLN
50 | INTRAMUSCULAR | Status: AC
Start: 2017-06-21 — End: 2017-06-21

## 2017-06-21 MED ORDER — LIDOCAINE-EPINEPHRINE 2 %-1:100000 IJ SOLN
INTRAMUSCULAR | Status: AC
Start: 2017-06-21 — End: 2017-06-21

## 2017-06-21 MED ORDER — VANCOMYCIN HCL 1000 MG IV SOLR
1000 MG | Freq: Once | INTRAVENOUS | Status: AC
Start: 2017-06-21 — End: 2017-06-21
  Administered 2017-06-21: 15:00:00 1000 mg via INTRAVENOUS

## 2017-06-21 MED ORDER — HEPARIN SOD (PORK) LOCK FLUSH 100 UNIT/ML IV SOLN
100 UNIT/ML | INTRAVENOUS | Status: DC | PRN
Start: 2017-06-21 — End: 2017-06-22
  Administered 2017-06-21: 20:00:00 300 [IU]

## 2017-06-21 MED FILL — HEPARIN (PORCINE) IN NACL 2-0.9 UNIT/ML-% IJ SOLN: INTRAMUSCULAR | Qty: 500

## 2017-06-21 NOTE — Progress Notes (Signed)
Patient's hemoglobin this AM:   Recent Labs      06/21/17   0935   HGB  6.6*      Patient's platelet count this AM:   Recent Labs      06/21/17   0935   PLT  74*     Pt underwent port placement today; CBC reviewed by [facility].  Seen at [place] today for PRBC transfusion per standing orders for above lab values.  Blood products transfused per [facility] policy.  Pt tolerated transfusion well and without incident.  Pt verbalizes understanding of discharge instructions.  Discharged ambulatory to home with wife.

## 2017-06-21 NOTE — H&P (Signed)
Patient:  David Watkins   DOB:   1956-08-30      Relevant clinical history, particularly as it involves the pending procedure, was reviewed and discussed.    The procedure including risks and benefits was discussed at length with the patient (or designated family member) and all questions were answered.  Informed consent to proceed with the procedure was given.    Vital signs were monitored and documented by the Radiology nurse.    Targeted physical examination  Heart : regular rate and rhythm  Lungs : clear, breathing easily  Condition : stable    [facility] nurses notes reviewed and agreed.    Past Medical History:        Diagnosis Date   . Bone pain    . Hyperlipidemia    . Low back pain    . Multiple myeloma ([place])    . Neuropathy     chemo induced, feet       Past Surgical History:           Procedure Laterality Date   . BONE MARROW BIOPSY     . BONE MARROW TRANSPLANT     . OTHER SURGICAL HISTORY Left 08/17/2016    trifusion cath placement   . PRE-MALIGNANT / BENIGN SKIN LESION EXCISION Left 03/29/2017    EXCISE LESION LEFT EXTERNAL EAR WITH FROZEN SECTION, FULL THICKNESS SKIN GRAFT       Allergies:  Lenalidomide; Pomalidomide; and Ceclor [cefaclor]    Medications:   Home Meds  No current facility-administered medications on file prior to encounter.      Current Outpatient Prescriptions on File Prior to Encounter   Medication Sig Dispense Refill   . traZODone (DESYREL) 50 MG tablet Take 50 mg by mouth nightly     . sertraline (ZOLOFT) 50 MG tablet Take 50 mg by mouth daily     . pantoprazole (PROTONIX) 40 MG tablet Take 1 tablet by mouth every morning (before breakfast) 30 tablet 3   . atorvastatin (LIPITOR) 10 MG tablet Take 10 mg by mouth daily     . valACYclovir (VALTREX) 500 MG tablet Take 500 mg by mouth 2 times daily     . ondansetron (ZOFRAN) 4 MG tablet Take 1 tablet by mouth every 6 hours as needed for Nausea or Vomiting 60 tablet 2   . ondansetron (ZOFRAN) 4 MG tablet Take 1 tablet by mouth every 6 hours  as needed for Nausea or Vomiting 60 tablet 3   . gabapentin (NEURONTIN) 300 MG capsule Take 1 capsule by mouth 3 times daily for 30 days.. 90 capsule 0   . prochlorperazine (COMPAZINE) 10 MG tablet Take 1 tablet by mouth every 6 hours as needed (Nausea) 90 tablet 3   . Fluticasone-Salmeterol (ADVAIR DISKUS IN) Inhale into the lungs         Current Meds    vancomycin (VANCOCIN) 1,000 mg in dextrose 5 % 250 mL IVPB Once         ASA 2 - Patient with mild systemic disease with no functional limitations    I (soft palate, uvula, fauces, tonsillar pillars visible)    Sedation : Moderate sedation planned

## 2017-06-21 NOTE — Plan of Care (Signed)
Problem: KNOWLEDGE DEFICIT  Goal: Patient/S.O. demonstrates understanding of disease process, treatment plan, medications, and discharge instructions.  Outcome: Ongoing      Problem: DISCHARGE BARRIERS  Goal: Patient's continuum of care needs are met  Outcome: Ongoing

## 2017-06-21 NOTE — Discharge Instructions (Signed)
[facility]  Cardiovascular Special Procedures  IMPLANTABLE VENOUS ACCESS DEVICE                                          Patient Information:   Your Port may be placed in the chest or peripherally in the arm. The following instructions are the same for both with only a few differences.     Maintain dressing after the procedure for 24 - 48 hours and then apply a new dressing supplied to you, do not use any ointment. Leave dressing on for 7 days. If the dressing becomes moist, you should change the dressing. If there is any leakage at the incision site, notify your doctor.     Keep site clean and dry for 7 days and observe for any signs of infection. Leave steri-strips on until edges become loose and fall off.  Do not pull them off.      Bruising and mild discomfort are expected.   You may apply an ice bag to the incision area to minimize bruising, discomfort, and swelling.     Contact your physician who placed the Port for any bleeding or extreme pain. Other symptoms to report are as follows:     Fever.     Skin warm to touch.     Numbness or weakness.     Swelling of neck or arm.     Redness or Tenderness along the portal site and/or the      catheter tract.     Drainage from the portal site, or if dressing is damp or      saturated.        Elevate arm above heart level as much as possible, at least 15 minutes every 2 hours for the remainder of the day and keep elevated during the night (arm port only).       Refrain from heavy lifting for greater than 10 pounds for 10-14 days.  This is important while the incision is healing.  Strenuous activities and exercise should be approached gradually.       You may shower as long as you keep the incision dry for the first 4 days.              The incision should not be immersed in water until it is completely healed.     Most of the sutures will be absorbed over the next few weeks.  If you have                     sutures that need to be removed, we will  arrange this with you. If you can see            any sutures 4 weeks after the port was placed, please call us and we will                          remove them     Caring for the Device:    When not in use, your nurse will flush the catheter monthly with a heparinized saline solution to prevent the catheter from occluding. When in use, regular needle changes and cleaning of the skin around the portal are necessary and will be performed by your nurse.         Important reminders for the Peripheral arm port   system:    Blood can be drawn from the arm port directly; the port will then be flushed with heparinized saline before the needle is removed.    Do not allow blood pressure readings to be taken from the arm that contains the portal, unless under special circumstances ordered by your physician      You may contact [facility]. for any questions or problems that may occur at [phone] during the hours of 9am-4pm Monday-Friday, or the hospital operator after hours at [phone], to have the interventional radiologist on call paged.      [facility]  Cardiovascular Special Procedures  General Discharge Instructions    PROCEDURE: _____________port a cath insertion_____________________    _X__ David Watkins may be drowsy or lightheaded after receiving sedation. DO NOT operate a vehicle (automobile, bicycle, motorcycle, machinery, or power tools), make any important decisions or sign any important/legal documents, or drink alcoholic beverages for the next 24 hours  _X__ We strongly suggest that a responsible adult be with you for the next 24 hours for your protection and safety  _X__ If the intravenous catheter site is painful, apply warm wet compresses on the site until the soreness is relieved and elevate the arm above the heart.  Call your physician if no improvement in 2 to 3 days    DIETARY INSTRUCTIONS:    ____ Drink extra fluids over the next 24 hours (If not contraindicated by illness or by                    physician order)  ____ Start with clear liquids and progress to normal diet as you feel like eating.  If you experience nausea or repeated episodes of vomiting, which persist beyond 12-24 hours, notify your doctor        _X__ Resume your previous diet    ACTIVITY INSTRUCTIONS:    _X__ See other instructions  ____ No special instructions  ____ Rest for 24 hours    ____ Up as tolerated  ____ Increase activity as tolerated    Wound/Dressing Instructions:  __X_ See other instructions  ____ May shower, tomorrow  ____ Remove bandage within 24 hours    MEDICATION INSTRUCTIONS:    __X_ See Medication Reconciliation Sheet      SPECIAL INSTRUCTIONS:  __________________________________________________________________________________________________________________________________________________________________________________________________________________________________________  Please make sure that you follow-up with your doctor's office for your results.    Call: _________________________________     FOLLOW-UP APPOINTMENT    Follow up with MD as directed    Belongings returned to patient and/or family: Yes.    The Discharge Instructions have been explained to me.  I understand and can verbalize these instructions.

## 2017-06-21 NOTE — Brief Op Note (Signed)
The procedure including risks, benefits, and alternatives was discussed at length with the patient and all questions were answered. Informed consent to proceed with the procedure was given.      PROCEDURE :     Right IJ chest port placement    Right arm PICC removal    BLOOD LOSS : Minimal  SPECIMENS : None  COMPLICATIONS : None  CONDITION : Stable    David Watkins Yanci Bachtell

## 2017-07-09 ENCOUNTER — Inpatient Hospital Stay: Payer: BLUE CROSS/BLUE SHIELD | Primary: Hematology & Oncology

## 2017-07-09 ENCOUNTER — Encounter

## 2017-07-09 ENCOUNTER — Inpatient Hospital Stay: Admit: 2017-07-09 | Payer: BLUE CROSS/BLUE SHIELD | Primary: Hematology & Oncology

## 2017-07-09 DIAGNOSIS — R05 Cough: Secondary | ICD-10-CM

## 2017-07-09 DIAGNOSIS — R059 Cough, unspecified: Secondary | ICD-10-CM

## 2017-07-27 ENCOUNTER — Inpatient Hospital Stay: Admit: 2017-07-27 | Payer: BLUE CROSS/BLUE SHIELD | Primary: Hematology & Oncology

## 2017-07-27 DIAGNOSIS — C9000 Multiple myeloma not having achieved remission: Secondary | ICD-10-CM

## 2017-07-27 LAB — PREPARE PLATELETS: Dispense Status Blood Bank: TRANSFUSED

## 2017-07-27 MED ORDER — HEPARIN SOD (PORK) LOCK FLUSH 100 UNIT/ML IV SOLN
100 UNIT/ML | INTRAVENOUS | Status: DC | PRN
Start: 2017-07-27 — End: 2017-07-28
  Administered 2017-07-27: 21:00:00 300 [IU]

## 2017-07-27 MED FILL — HEPARIN LOCK FLUSH 100 UNIT/ML IV SOLN: 100 UNIT/ML | INTRAVENOUS | Qty: 5

## 2017-07-27 NOTE — Plan of Care (Signed)
Problem: Bleeding:  Goal: Will show no signs and symptoms of excessive bleeding  Will show no signs and symptoms of excessive bleeding   Outcome: Met This Shift  Pt platelet count 8.  Platelets infused and pt tolerated well.  Pt educated/reminded about bleeding precautions.  Pt verbalizes understanding.  No signs of active bleeding this shift.  Medications and post transfusion d/c instructions reviewed.  Pt has no questions.  D/C'd ambulatory.

## 2017-08-04 ENCOUNTER — Encounter

## 2017-08-04 ENCOUNTER — Inpatient Hospital Stay: Admit: 2017-08-04 | Discharge: 2017-08-04 | Payer: BLUE CROSS/BLUE SHIELD | Primary: Hematology & Oncology

## 2017-08-04 ENCOUNTER — Inpatient Hospital Stay: Admit: 2017-08-04 | Payer: BLUE CROSS/BLUE SHIELD | Primary: Hematology & Oncology

## 2017-08-04 DIAGNOSIS — M25552 Pain in left hip: Secondary | ICD-10-CM

## 2017-08-04 DIAGNOSIS — C9202 Acute myeloblastic leukemia, in relapse: Secondary | ICD-10-CM

## 2017-08-04 LAB — PREPARE PLATELETS: Dispense Status Blood Bank: TRANSFUSED

## 2017-08-04 MED ORDER — ACETAMINOPHEN 325 MG PO TABS
325 MG | ORAL | Status: DC | PRN
Start: 2017-08-04 — End: 2017-08-05
  Administered 2017-08-04: 20:00:00 650 mg via ORAL

## 2017-08-04 MED ORDER — HEPARIN SOD (PORK) LOCK FLUSH 100 UNIT/ML IV SOLN
100 UNIT/ML | INTRAVENOUS | Status: DC | PRN
Start: 2017-08-04 — End: 2017-08-05
  Administered 2017-08-04: 21:00:00 500 [IU]

## 2017-08-04 MED FILL — HEPARIN LOCK FLUSH 100 UNIT/ML IV SOLN: 100 [IU]/mL | INTRAVENOUS | Qty: 5

## 2017-08-04 MED FILL — ACETAMINOPHEN 325 MG PO TABS: 325 mg | ORAL | Qty: 2

## 2017-08-04 NOTE — Plan of Care (Signed)
Problem: Bleeding:  Goal: Will show no signs and symptoms of excessive bleeding  Will show no signs and symptoms of excessive bleeding   Outcome: Met This Shift  Pt platelet count 19.  Single donor platelets given and tolerated well.  Pt educated/reminded about bleeding precautions.  Pt verbalizes understanding.  No signs of active bleeding this shift.  patient left ambulatory by self to CT for scan.    Problem: Falls - Risk of:  Goal: Will remain free from falls  Will remain free from falls     Outcome: Met This Shift    Explained fall risk precautions to pt and rationale behind their use to keep the patient safe. Belongings are in reach. Pt encouraged to notify staff for any and all assistance. Staff present in tx suite throughout entirety of pts treatment to monitor and protect from falls.  Assistance provided when ambulating to restroom utilizing Stay With Me.

## 2017-08-04 NOTE — Progress Notes (Signed)
Patient seen and assessed at [facility].  Here for platelets for count of 19.  Alert, oriented, vss.  Had benadryl at 1015 in [facility], is feeling very sleepy and is driving himself home, will hold further dose of benadryl at this time and reevaluate with infusion.

## 2017-08-13 ENCOUNTER — Ambulatory Visit: Payer: BLUE CROSS/BLUE SHIELD | Primary: Hematology & Oncology

## 2017-08-16 ENCOUNTER — Ambulatory Visit: Payer: BLUE CROSS/BLUE SHIELD | Primary: Hematology & Oncology

## 2017-08-16 NOTE — Progress Notes (Signed)
PRE-OP INSTRUCTIONS FOR THE SURGICAL PATIENT YOU ARE UNABLE TO MAKE CONTACT FOR AN INTERVIEW:      1. Follow instructions for your ARRIVAL TIME as DIRECTED BY YOUR SURGEON.   2. Enter the MAIN entrance located on [address] and report to the desk.   3. Bring your insurance & prescription card and photo ID with you. You may also be asked to pay a co-pay, as you may want to bring a check or credit card with you.   4. Leave all other valuables at home.               5. Arrange for someone to drive you home and be with you for the first 24 hours after discharge.  6. You must contact your surgeon for ALL medication instructions, especially if taking blood thinners, aspirin, or diabetic medication.  7. A Pre-op History and Physical for surgery MUST be completed by your Physician or an Urgent Care within 30 days of your procedure date.  Please bring a copy with you on the day of your procedure and along with any other testing performed.  8. DO NOT EAT ANYTHING eight hours prior to surgery.  May have up to 8 ounces of water 4 hours prior to surgery.   9. No gum, candy, mints, or ice chips day of procedure.   10. Please refrain from drinking alcohol the day before or day of your procedure.   11. Please do not smoke the day of your procedure.    12. Dress in loose, comfortable clothing appropriate for redressing after your procedure. Do not wear jewelry (including body piercings), make-up, fingernail polish, lotion, powders or metal hairclips. Contacts will need to be removed prior to surgery.  13. If you use a CPAP, please bring it with you on the day of your procedure.  14. Do not shave or wax for 72 hours prior to procedure near your operative site  12. FOR WOMAN OF CHILDBEARING AGE ONLY- please bring a urine sample with you on day of surgery or make sure we can collect on arrival.    If you have further questions, you may contact us at [phone]    Left instructions on patient's voicemail.    Yaseen Gilberg.08/16/2017 .    APPT WITH [facility] 12/11. ASHLEY, AT [facility], WILL FAX OFFICE NOTE FOR H&P

## 2017-08-17 ENCOUNTER — Inpatient Hospital Stay: Payer: BLUE CROSS/BLUE SHIELD | Primary: Hematology & Oncology

## 2017-08-18 ENCOUNTER — Inpatient Hospital Stay: Admit: 2017-08-18 | Payer: BLUE CROSS/BLUE SHIELD | Primary: Hematology & Oncology

## 2017-08-18 ENCOUNTER — Encounter

## 2017-08-18 DIAGNOSIS — M549 Dorsalgia, unspecified: Secondary | ICD-10-CM

## 2017-08-18 MED ORDER — MORPHINE SULFATE (PF) 4 MG/ML IV SOLN
4 MG/ML | INTRAVENOUS | Status: DC | PRN
Start: 2017-08-18 — End: 2017-08-19

## 2017-08-18 MED ORDER — OXYCODONE HCL 5 MG PO TABS
5 MG | ORAL | Status: AC | PRN
Start: 2017-08-18 — End: 2017-08-18

## 2017-08-18 MED ORDER — PROPOFOL 200 MG/20ML IV EMUL
200 MG/20ML | INTRAVENOUS | Status: DC | PRN
Start: 2017-08-18 — End: 2017-08-18
  Administered 2017-08-18: 19:00:00 250 via INTRAVENOUS

## 2017-08-18 MED ORDER — METOCLOPRAMIDE HCL 5 MG/ML IJ SOLN
5 MG/ML | Freq: Once | INTRAMUSCULAR | Status: AC | PRN
Start: 2017-08-18 — End: 2017-08-18

## 2017-08-18 MED ORDER — PROMETHAZINE HCL 25 MG/ML IJ SOLN
25 MG/ML | Freq: Once | INTRAMUSCULAR | Status: AC | PRN
Start: 2017-08-18 — End: 2017-08-18

## 2017-08-18 MED ORDER — HYDROMORPHONE HCL 1 MG/ML IJ SOLN
1 MG/ML | INTRAMUSCULAR | Status: DC | PRN
Start: 2017-08-18 — End: 2017-08-19

## 2017-08-18 MED ORDER — LACTATED RINGERS IV SOLN
INTRAVENOUS | Status: DC | PRN
Start: 2017-08-18 — End: 2017-08-18
  Administered 2017-08-18: 19:00:00 via INTRAVENOUS

## 2017-08-18 MED ORDER — MEPERIDINE HCL 25 MG/ML IJ SOLN
25 MG/ML | INTRAMUSCULAR | Status: DC | PRN
Start: 2017-08-18 — End: 2017-08-19

## 2017-08-18 MED ORDER — LABETALOL HCL 5 MG/ML IV SOLN
5 MG/ML | INTRAVENOUS | Status: DC | PRN
Start: 2017-08-18 — End: 2017-08-19

## 2017-08-18 MED ORDER — HYDRALAZINE HCL 20 MG/ML IJ SOLN
20 MG/ML | INTRAMUSCULAR | Status: DC | PRN
Start: 2017-08-18 — End: 2017-08-19

## 2017-08-18 MED ORDER — GADOTERIDOL 279.3 MG/ML IV SOLN
279.3 MG/ML | Freq: Once | INTRAVENOUS | Status: AC | PRN
Start: 2017-08-18 — End: 2017-08-18
  Administered 2017-08-18: 20:00:00 20 mL via INTRAVENOUS

## 2017-08-18 MED ORDER — PHENYLEPHRINE HCL 10 MG/ML IJ SOLN
10 MG/ML | INTRAMUSCULAR | Status: DC | PRN
Start: 2017-08-18 — End: 2017-08-18
  Administered 2017-08-18 (×2): 100 via INTRAVENOUS

## 2017-08-18 MED ORDER — DIPHENHYDRAMINE HCL 50 MG/ML IJ SOLN
50 MG/ML | Freq: Once | INTRAMUSCULAR | Status: AC | PRN
Start: 2017-08-18 — End: 2017-08-18

## 2017-08-18 MED ORDER — LIDOCAINE HCL 2 % IJ SOLN
2 % | INTRAMUSCULAR | Status: DC | PRN
Start: 2017-08-18 — End: 2017-08-18
  Administered 2017-08-18: 19:00:00 100 via INTRAVENOUS

## 2017-08-18 NOTE — Anesthesia Pre-Procedure Evaluation (Signed)
Department of Anesthesiology  Preprocedure Note       Name:  David Watkins   Age:  61 y.o.  DOB:  Apr 15, 1956                                          MRN:  [id number]         Date:  08/18/2017      Surgeon: * No surgeons listed *    Procedure: Force MRI W ANESTHESIA    Medications prior to admission:   Prior to Admission medications    Medication Sig Start Date End Date Taking? Authorizing Provider   traZODone (DESYREL) 50 MG tablet Take 50 mg by mouth nightly   Yes Historical Provider, MD   sertraline (ZOLOFT) 50 MG tablet Take 50 mg by mouth daily   Yes Historical Provider, MD   atorvastatin (LIPITOR) 10 MG tablet Take 10 mg by mouth daily   Yes Historical Provider, MD   valACYclovir (VALTREX) 500 MG tablet Take 500 mg by mouth 2 times daily   Yes Historical Provider, MD   ondansetron (ZOFRAN) 4 MG tablet Take 1 tablet by mouth every 6 hours as needed for Nausea or Vomiting 05/29/17   Marianne Sofia, MD   ondansetron (ZOFRAN) 4 MG tablet Take 1 tablet by mouth every 6 hours as needed for Nausea or Vomiting 05/29/17   Marianne Sofia, MD   gabapentin (NEURONTIN) 300 MG capsule Take 1 capsule by mouth 3 times daily for 30 days.. 05/01/17 05/31/17  Jannette Spanner, MD   pantoprazole (PROTONIX) 40 MG tablet Take 1 tablet by mouth every morning (before breakfast) 05/01/17   Jannette Spanner, MD   prochlorperazine (COMPAZINE) 10 MG tablet Take 1 tablet by mouth every 6 hours as needed (Nausea) 05/01/17   Jannette Spanner, MD   Fluticasone-Salmeterol ([place]) Inhale into the lungs    Historical Provider, MD       Current medications:    Current Outpatient Prescriptions   Medication Sig Dispense Refill   . traZODone (DESYREL) 50 MG tablet Take 50 mg by mouth nightly     . sertraline (ZOLOFT) 50 MG tablet Take 50 mg by mouth daily     . atorvastatin (LIPITOR) 10 MG tablet Take 10 mg by mouth daily     . valACYclovir (VALTREX) 500 MG tablet Take 500 mg by mouth 2 times daily     . ondansetron  (ZOFRAN) 4 MG tablet Take 1 tablet by mouth every 6 hours as needed for Nausea or Vomiting 60 tablet 2   . ondansetron (ZOFRAN) 4 MG tablet Take 1 tablet by mouth every 6 hours as needed for Nausea or Vomiting 60 tablet 3   . gabapentin (NEURONTIN) 300 MG capsule Take 1 capsule by mouth 3 times daily for 30 days.. 90 capsule 0   . pantoprazole (PROTONIX) 40 MG tablet Take 1 tablet by mouth every morning (before breakfast) 30 tablet 3   . prochlorperazine (COMPAZINE) 10 MG tablet Take 1 tablet by mouth every 6 hours as needed (Nausea) 90 tablet 3   . Fluticasone-Salmeterol (ADVAIR DISKUS IN) Inhale into the lungs       Current Facility-Administered Medications   Medication Dose Route Frequency Provider Last Rate Last Dose   . HYDROmorphone (DILAUDID) injection 0.25 mg  0.25 mg Intravenous Q5 Min PRN Windy Kalata, MD       .  HYDROmorphone (DILAUDID) injection 0.5 mg  0.5 mg Intravenous Q5 Min PRN Windy Kalata, MD       . morphine (PF) injection 1 mg  1 mg Intravenous Q5 Min PRN Windy Kalata, MD       . HYDROmorphone (DILAUDID) injection 0.5 mg  0.5 mg Intravenous Q5 Min PRN Windy Kalata, MD       . oxyCODONE (ROXICODONE) immediate release tablet 5 mg  5 mg Oral PRN Windy Kalata, MD        Or   . oxyCODONE (ROXICODONE) immediate release tablet 10 mg  10 mg Oral PRN Windy Kalata, MD       . diphenhydrAMINE (BENADRYL) injection 12.5 mg  12.5 mg Intravenous Once PRN Windy Kalata, MD       . metoclopramide (REGLAN) injection 10 mg  10 mg Intravenous Once PRN Windy Kalata, MD       . promethazine (PHENERGAN) injection 6.25 mg  6.25 mg Intravenous Once PRN Windy Kalata, MD       . labetalol (NORMODYNE;TRANDATE) injection 5 mg  5 mg Intravenous Q10 Min PRN Windy Kalata, MD       . hydrALAZINE (APRESOLINE) injection 5 mg  5 mg Intravenous Q10 Min PRN Windy Kalata, MD       . meperidine (DEMEROL) injection 12.5 mg  12.5 mg Intravenous Q5 Min PRN Windy Kalata, MD           Allergies:     Allergies   Allergen Reactions   . Lenalidomide Rash   . Pomalidomide Hives   . Ceclor [Cefaclor] Hives       Problem List:    Patient Active Problem List   Diagnosis Code   . Multiple myeloma not having achieved remission (HCC) C90.00   . 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 Multiple myeloma in remission (HCC) C90.01   . Moderate malnutrition (HCC) E44.0   . Open wound of left ear S01.302A   . Ear lesion H93.90   . Skin lesion L98.9   . 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 Bone pain    . Hyperlipidemia    . Low back pain    . Multiple myeloma ([place])    . Neuropathy     chemo induced, feet       Past Surgical History:        Procedure Laterality Date   . BONE MARROW BIOPSY     . BONE MARROW TRANSPLANT     . OTHER SURGICAL HISTORY Left 08/17/2016    trifusion cath placement   . PRE-MALIGNANT / BENIGN SKIN LESION EXCISION Left 03/29/2017    EXCISE LESION LEFT EXTERNAL EAR WITH FROZEN SECTION, FULL THICKNESS SKIN GRAFT       Social History:    Social History   Substance Use Topics   . Smoking status: Former Smoker     Quit date: 11/25/2012   . Smokeless tobacco: Never Used   . Alcohol use No                                Counseling given: Not Answered      Vital Signs (Current):   Vitals:  08/18/17 1101   BP: 125/81   Pulse: 77   Resp: 16   Temp: 97.8 F (36.6 C)   TempSrc: Oral   SpO2: 98%   Weight: 195 lb (88.5 kg)   Height: 5' 9"  (1.753 m)                                              BP Readings from Last 3 Encounters:   08/18/17 125/81   08/04/17 136/74   07/27/17 124/70       NPO Status: Time of last liquid consumption: 2100                        Time of last solid consumption: 2100                        Date of last liquid consumption: 08/17/17                        Date of last solid food consumption: 08/17/17    BMI:   Wt Readings from Last 3 Encounters:   08/18/17 195  lb (88.5 kg)   08/16/17 180 lb (81.6 kg)   06/21/17 180 lb (81.6 kg)     Body mass index is 28.8 kg/m.    CBC:   Lab Results   Component Value Date    WBC 6.7 06/21/2017    RBC 2.08 06/21/2017    RBC 3.30 04/24/2016    HGB 6.6 06/21/2017    HCT 19.3 06/21/2017    MCV 92.7 06/21/2017    RDW 19.9 06/21/2017    PLT 74 06/21/2017       CMP:   Lab Results   Component Value Date    NA 138 05/29/2017    K 4.2 05/29/2017    CL 103 05/29/2017    CO2 22 05/29/2017    BUN 22 05/29/2017    CREATININE 0.8 05/29/2017    GFRAA >60 05/29/2017    AGRATIO 1.3 08/19/2016    LABGLOM >60 05/29/2017    GLUCOSE 193 05/29/2017    GLUCOSE 110 04/24/2016    PROT 6.2 05/29/2017    PROT 6.6 04/24/2016    CALCIUM 7.3 05/29/2017    BILITOT 0.4 05/29/2017    ALKPHOS 218 05/29/2017    AST 48 05/29/2017    ALT 75 05/29/2017       POC Tests: No results for input(s): POCGLU, POCNA, POCK, POCCL, POCBUN, POCHEMO, POCHCT in the last 72 hours.    Coags:   Lab Results   Component Value Date    PROTIME 12.3 06/21/2017    INR 1.08 06/21/2017    APTT 27.4 05/27/2017       HCG (If Applicable): No results found for: PREGTESTUR, PREGSERUM, HCG, HCGQUANT     ABGs: No results found for: PHART, PO2ART, PCO2ART, HCO3ART, BEART, O2SATART     Type & Screen (If Applicable):  No results found for: LABABO, [place]    Anesthesia Evaluation  Patient summary reviewed and Nursing notes reviewed no history of anesthetic complications:   Airway: Mallampati: II  TM distance: >3 FB   Neck ROM: full  Mouth opening: > = 3 FB Dental:          Pulmonary:   (+) COPD:  Cardiovascular:Negative CV ROS    (+) hyperlipidemia                  Neuro/Psych:   Negative Neuro/Psych ROS              GI/Hepatic/Renal: Neg GI/Hepatic/Renal ROS            Endo/Other:    (+) malignancy/cancer.                  ROS comment: Multiple Myeloma  Leukemia Abdominal:           Vascular: negative vascular ROS.                                       Anesthesia Plan      general      ASA 61     (61 year old male presents for MRI of the thoracic spine.  Plan general anesthesia with ASA standard monitors.  Questions answered.  Patient agreeable with anesthetic plan.  )  Induction: intravenous.      Anesthetic plan and risks discussed with patient.      Plan discussed with CRNA.    Attending anesthesiologist reviewed and agrees with Pre Eval content              Windy Kalata, MD   08/18/2017

## 2017-08-18 NOTE — Progress Notes (Signed)
MRI questionaire completed.  Spoke with [facility] to verify that portacath is MRI compliant.  They indicated that it is MRI compliant.

## 2017-08-18 NOTE — Progress Notes (Signed)
Patient arrived post MRI under anesthesia. Per CRNA patient was stable during procedure. Patient connected to bedside monitors; VSS. Patient resting comfortably and denies pain.

## 2017-08-18 NOTE — Anesthesia Post-Procedure Evaluation (Signed)
Department of Anesthesiology  Postprocedure Note    Patient: David Watkins  MRN: [id number]  Birthdate: [birth year]  Date of evaluation: 08/18/2017  Time:  7:09 PM     Procedure Summary     Date:  08/18/17 Room / Location:  [facility]    Anesthesia Start:  7619 Anesthesia Stop:  5093    Procedure:  MRI THORACIC SPINE W WO CONTRAST Diagnosis:  Back pain, unspecified back location, unspecified back pain laterality, unspecified chronicity    Scheduled Providers:   Responsible Provider:      Anesthesia Type:  general ASA Status:  3          Anesthesia Type: general    Aldrete Phase I: Aldrete Score: 10    Aldrete Phase II:      Last vitals: Reviewed and per EMR flowsheets.       Anesthesia Post Evaluation    Patient location during evaluation: PACU  Patient participation: complete - patient participated  Level of consciousness: awake and alert  Airway patency: patent  Nausea & Vomiting: no nausea and no vomiting  Cardiovascular status: blood pressure returned to baseline  Respiratory status: acceptable  Hydration status: euvolemic

## 2017-08-18 NOTE — Progress Notes (Signed)
Ambulatory Surgery/Procedure Discharge Note    Vitals:    08/18/17 1615   BP:    Pulse: 86   Resp:    Temp:    SpO2:    125/75   17 resp  Temp 97.8   97% O2    No intake/output data recorded.    Restroom use offered before discharge.  Yes    Pain assessment:  none           Patient discharged to home/self care. Patient discharged via wheel chair by transporter to waiting family/S.O.       08/18/2017 5:14 PM

## 2017-08-18 NOTE — Discharge Instructions (Signed)
[place] may be drowsy or lightheaded after receiving sedation or anesthesia. Do not operate any vehicles (automobiles, bicycles, motorcycles) or power tools or machinery for 24 hours.  Do not sign any legal documents or make any legal decisions for 24 hours. Do not drink alcohol for 24 hours or while taking narcotic pain medication.  A responsible person should be with you for the next 24 hours.    Please follow the instructions checked below:      DIET INSTRUCTIONS:  [x] Start with light diet and progress to your normal diet as you feel like eating. Nausea may be a side effect of anesthesia.  However, if you experience nausea or repeated episodes of vomiting which persist beyond 12-24 hours, notify your doctor.             MEDICATION INSTRUCTIONS:  [x] Prescription(S) x  none   sent with you.  Use as directed.  When taking pain medications, you may experience the side effect of dizziness or drowsiness.  Do not drink alcohol or drive when taking these medications.  [] Prescription(S) x          Called to Pharmacy Name and location:    [x] Give the list of your medications to your primary care physician on your next visit. Keep your med list updated and carry it with in case of emergencies.    []  Narcotic pain medications can cause the side effect of significant constipation.  You may want to add a stool softener to your postoperative medication schedule or speak to your surgeon on how best to manage this side effect.    If you have a CPAP machine, it is very important that you use it daily during all periods of sleep and daytime rest during your recovery at home.  Surgery and Anesthesia place a significant amount of stress on your body.  Using your CPAP will help keep you safe and lessen the negative effects of that stress.    FOLLOW-UP RECOVERY CARE:  [x] Call the office for follow-up appointment and problems    Watch for these possible complications or  symptoms.  Call physician if they or any other problems occur:  - Signs of INFECTION   > Fever over 101     > Redness, swelling, hardness or warmth at the operative site   >Foul smelling or cloudy drainage at the operative site   - Unrelieved PAIN  - Unrelieved NAUSEA  - Blood soaked dressing.  (Some oozing may be normal)  - Inability to urinate      - Numb, pale, blue, cold or tingling extremity      Physician:  Dr. Claudina Lick    The above instructions were reviewed with patient/significant other.  The following additional patient specific information was reviewed with the patient/significant other:  [x] Procedure/physician specific instructions  [] Medication information sheet(S)  [] Dionne'S egress test  [] Pain Ball management  [] FAQ Catheter associated blood stream infections  [] FAQ Surgical Site Infections  [] Other-    I have read and understand the instructions given to me: ____________________________________________   (Patient/S.O. Signature)            Date/time 08/18/2017 4:10 PM         PACU:  [phone]   M-F 700 AM - 7 PM      SAME DAY SERVICES:  [phone] M-F 7AM-6PM        If you smoke STOP. We care about your health!

## 2017-08-30 ENCOUNTER — Inpatient Hospital Stay: Admit: 2017-08-30 | Discharge: 2017-08-30 | Payer: BLUE CROSS/BLUE SHIELD | Primary: Hematology & Oncology

## 2017-08-30 ENCOUNTER — Inpatient Hospital Stay: Admit: 2017-08-30 | Payer: BLUE CROSS/BLUE SHIELD | Primary: Hematology & Oncology

## 2017-08-30 ENCOUNTER — Encounter

## 2017-08-30 DIAGNOSIS — C7951 Secondary malignant neoplasm of bone: Secondary | ICD-10-CM

## 2017-08-30 MED ORDER — GADOTERIDOL 279.3 MG/ML IV SOLN
279.3 MG/ML | Freq: Once | INTRAVENOUS | Status: AC | PRN
Start: 2017-08-30 — End: 2017-08-30
  Administered 2017-08-30: 20:00:00 18 mL via INTRAVENOUS

## 2017-08-30 MED ORDER — HYDROMORPHONE HCL 1 MG/ML IJ SOLN
1 MG/ML | INTRAMUSCULAR | Status: DC | PRN
Start: 2017-08-30 — End: 2017-08-31

## 2017-08-30 MED ORDER — OXYCODONE HCL 5 MG PO TABS
5 MG | ORAL | Status: AC | PRN
Start: 2017-08-30 — End: 2017-08-30

## 2017-08-30 MED ORDER — HYDRALAZINE HCL 20 MG/ML IJ SOLN
20 MG/ML | INTRAMUSCULAR | Status: DC | PRN
Start: 2017-08-30 — End: 2017-08-31

## 2017-08-30 MED ORDER — METOCLOPRAMIDE HCL 5 MG/ML IJ SOLN
5 MG/ML | Freq: Once | INTRAMUSCULAR | Status: AC | PRN
Start: 2017-08-30 — End: 2017-08-30

## 2017-08-30 MED ORDER — MIDAZOLAM HCL 2 MG/2ML IJ SOLN
2 | INTRAMUSCULAR | Status: AC
Start: 2017-08-30 — End: 2017-08-30

## 2017-08-30 MED ORDER — MEPERIDINE HCL 25 MG/ML IJ SOLN
25 MG/ML | INTRAMUSCULAR | Status: DC | PRN
Start: 2017-08-30 — End: 2017-08-31

## 2017-08-30 MED ORDER — FENTANYL CITRATE (PF) 100 MCG/2ML IJ SOLN
100 | INTRAMUSCULAR | Status: AC
Start: 2017-08-30 — End: 2017-08-30

## 2017-08-30 MED ORDER — LABETALOL HCL 5 MG/ML IV SOLN
5 MG/ML | INTRAVENOUS | Status: DC | PRN
Start: 2017-08-30 — End: 2017-08-31

## 2017-08-30 MED ORDER — DIPHENHYDRAMINE HCL 50 MG/ML IJ SOLN
50 MG/ML | Freq: Once | INTRAMUSCULAR | Status: AC | PRN
Start: 2017-08-30 — End: 2017-08-30

## 2017-08-30 MED ORDER — EPHEDRINE SULFATE 50 MG/ML IJ SOLN
50 | INTRAMUSCULAR | Status: AC
Start: 2017-08-30 — End: 2017-08-30

## 2017-08-30 MED ORDER — MORPHINE SULFATE (PF) 4 MG/ML IV SOLN
4 MG/ML | INTRAVENOUS | Status: DC | PRN
Start: 2017-08-30 — End: 2017-08-31

## 2017-08-30 MED ORDER — PROMETHAZINE HCL 25 MG/ML IJ SOLN
25 MG/ML | Freq: Once | INTRAMUSCULAR | Status: AC | PRN
Start: 2017-08-30 — End: 2017-08-30

## 2017-08-30 MED FILL — MIDAZOLAM HCL 2 MG/2ML IJ SOLN: 2 mg/mL | INTRAMUSCULAR | Qty: 2

## 2017-08-30 MED FILL — FENTANYL CITRATE (PF) 100 MCG/2ML IJ SOLN: 100 MCG/2ML | INTRAMUSCULAR | Qty: 2

## 2017-08-30 MED FILL — EPHEDRINE SULFATE 50 MG/ML IJ SOLN: 50 mg/mL | INTRAMUSCULAR | Qty: 1

## 2017-08-30 NOTE — Discharge Instructions (Signed)
[place] may be drowsy or lightheaded after receiving sedation or anesthesia. Do not operate any vehicles (automobiles, bicycles, motorcycles) or power tools or machinery for 24 hours.  Do not sign any legal documents or make any legal decisions for 24 hours. Do not drink alcohol for 24 hours or while taking narcotic pain medication.  A responsible person should be with you for the next 24 hours.    Please follow the instructions checked below:    DIET INSTRUCTIONS:  [x] Start with light diet and progress to your normal diet as you feel like eating. Nausea may be a side effect of anesthesia. However,if you experience nausea or repeated episodes of vomiting which persist beyond 12-24 hours, notify your doctor.  [] Other     ACTIVITY INSTRUCTIONS:  [x] Rest today. Increase activity as tolerated    [] Elevate operative limb   [] Sling to operative limb  [] No heavy lifting or strenuous activity     [x] No driving for 24 hours   [] Use crutches  [] Use walker   [] Weight bearing: [] none /  [] partial / [] full as tolerated   [] Other     WOUND/DRESSING INSTRUCTIONS:       Always ensure you and your care giver clean hands before and after caring for the wound.  [x] May shower      [x] May bathe                                                   [] Keep dressing dry            [] Do not remove dressing   [] Remove dressing on     [] Leave steri strips in place    [] Derma bond dressing-Do not apply liquid or ointment medications or any other product to your wound while the adhesive dressing is in place. These may loosen the film before your wound is healed. You may occasionally and briefly wet your wound in the shower.  After showering, gently blot your wound dry with a soft towel.  [] Drain care      [] Ice to operative site for 15-30 minutes of each hour while awake for 24-36 hours  [] Use ice cooling system as instructed                  [] Post-op Shoe-wear when up and about                      [] Wear bra continuously for 24-36 hours  [] Other :  MEDICATION INSTRUCTIONS:  [] Prescription(s):     sent with you.  Use as directed.  When taking pain medications, you may experience the side effect of dizziness or drowsiness.  Do not drink alcohol or drive when taking these medications.  [] Prescription(s):          Called to Pharmacy Name and location:    [] You may take a non-prescription "headache remedy", preferably one that does not contain aspirin.    [x] Give the list of your medications to your primary care physician on your next visit. Keep your med list updated and carry it with in case of emergencies.    [] If you take any blood thinning medication, such as Coumadin or Plavix, check with your surgeon on when to re-start taking it.    [] Narcotic pain medications can cause the side effect of significant constipation.  You may want to add a stool softener to your postoperative medication schedule or speak to your surgeon on how best to manage this side effect.    If you have a CPAP machine, it is very important that you use it daily during all periods of sleep and daytime rest during your recovery at home.  Surgery and Anesthesia place a significant amount of stress on your body.  Using your CPAP will help keep you safe and lessen the negative effects of that stress.    FOLLOW-UP RECOVERY CARE:  Call the office   - Watch for the following significant complications, symptoms, or side effects of aneshthesia.       Call physician if they or any other problems occur.  If unable to reach your physician, please go to       the emergency department.  - Signs of INFECTION          > Fever over 101            > Redness, swelling, hardness or warmth at the operative site          > Foul smelling or cloudy drainage at the operative site  - Unrelieved NAUSEA                - Unrelieved PAIN    - BLOOD soaked dressing. (Some oozing may be normal)  - Inability to urinate      - Numb, pale, blue, cold or tingling extremity        Physician: MRI      The above instructions were reviewed with patient/significant other.  Copy given to patient/significant other.  The following additional patient specific information was reviewed with the patient/significant other:  [] Procedure/physician specific instructions  [] Medication information sheet(s) including indication for medication and possible side effects  [] Dionne's egress test  [] Pain Ball management  [] FAQ Catheter associated blood stream infections  [x] FAQ Surgical Site Infections    [] Other-         Date/time 08/30/2017 3:57 PM         PACU:  [phone]      SAME DAY SERVICES:  [phone]      (M-F 8am - 8pm)                    (M-F 6am - 6pm)        If you smoke STOP. We care about your health!    FAQs  (frequently asked questions)  About "Surgical Site Infections"    What is a Surgical Site Infection (SSI)?   A surgical site infection is an infection that occurs after surgery  in the part of the body where the surgery took place.  Most patients who have surgery do not develop an infection.  However, infections develop in about 1 to 3 out of every 100 patients who have surgery.   Some common symptoms of a surgical site infection are:   Marland Kitchen Redness and pain around the area where you had surgery  . Drainage of cloudy fluid from your surgical wound   . Fever    Can SSIs be treated?   Yes. Most surgical site infections can be treated with antibiotics.  The antibiotic given to you depends on the bacteria (germs) causing the infection.  Sometimes patients with SSIs also need another surgery to treat the infection.      What are some of the things that hospitals are doing to prevent SSIs?  To prevent SSIs, doctors, nurses and other healthcare providers:   . Clean their hands and arms up to their elbows with an antiseptic agent just before  the surgery.   . Clean their hands with soap and water or an alcohol-based hand rub before and after caring for each patient.   . May remove some of your hair immediately before your surgery using electric clippers if the hair is in the same area where the procedure will occur.  They should not shave you with a razor.   . Wear special hair covers, masks, gowns, and gloves during surgery to keep the surgery area clean.  . Give you antibiotics before your surgery starts.  In most cases, you should get antibiotics within 60 minutes before the surgery starts and the antibiotics should be stopped within 24 hours after surgery.  . Clean the skin at the site of your surgery with a special soap that kills germs.     What can I do to help prevent SSIs?   Before you surgery:  . Tell your doctor about other medical problems you may have.  Health problems such as allergies, diabetes, and obesity could affect your surgery and your treatment.    . Quit smoking.  Patients who smoke get more infections.  Talk to your doctor about how you can quit before your surgery.  . Do not shave near where you will have surgery.  Shaving with a razor can irritate your skin and make it easier to develop an infection.        At the time of your surgery:  . Speak up if someone tries to shave you with a razor before surgery.  Ask why you need to be shaved and talk with your surgeon if you have any concerns.   . Ask if you will get antibiotics before surgery.    After your surgery:  . Make sure that your healthcare providers clean their hands before examining you, either with soap and water or an alcohol-based hand rub.   . IF YOU DO NOT SEE YOUR PROVIDERS CLEAN THEIR HANDS, PLEASE ASK THEM TO DO SO.   . Family and friends who visit you should not touch the surgical wound or dressings.  . Family and friends should clean their hands with soap and water or an alcohol-based hand rub before and after visiting you.  If you do not see them clean their  hands, ask them to clean their hands.     What do I need to do when I go home from the hospital?  . Before you go home, your doctor nurses should explain everything you need to know about  taking care of your wound.  Make sure you understand how to care for your wound before you leave the hospital.    . Always clean your hands before and after caring for your wound.    . Before you go home, make sure you know who to contact if you have questions or problems after you get home.   . If you have any symptoms of an infection, such as redness and pain at the surgery site, drainage, or fever, call your doctor immediately.     If you have additional questions, please ask your doctor or nurse.     Thank you for allowing [place] to care for you.  We hope we have exceeded your expectations and provided a very good overall experience while taking care of you and your family.

## 2017-08-30 NOTE — Anesthesia Post-Procedure Evaluation (Signed)
Department of Anesthesiology  Postprocedure Note    Patient: David Watkins  MRN: [id number]  Birthdate: Feb 18, 1956  Date of evaluation: 08/30/2017  Time:  6:33 PM     Procedure Summary     Date:  08/30/17 Room / Location:  [facility] MRI    Anesthesia Start:  1330 Anesthesia Stop:  5916    Procedure:  MRI LUMBAR SPINE W WO CONTRAST Diagnosis:  Bone metastases ([place])    Scheduled Providers:   Responsible Provider:      Anesthesia Type:  general ASA Status:  3          Anesthesia Type: general    Aldrete Phase I: Aldrete Score: 8    Aldrete Phase II: Aldrete Score: 10    Last vitals: Reviewed and per EMR flowsheets.       Anesthesia Post Evaluation    Patient location during evaluation: PACU  Patient participation: complete - patient participated  Level of consciousness: awake and alert  Pain score: 0  Airway patency: patent  Nausea & Vomiting: no nausea and no vomiting  Complications: no  Cardiovascular status: hemodynamically stable  Respiratory status: acceptable  Hydration status: euvolemic

## 2017-08-30 NOTE — Progress Notes (Signed)
Ambulatory Surgery/Procedure Discharge Note    Vitals:    08/30/17 1617   BP: 126/80   Pulse: 94   Resp:    Temp: 98.3 F (36.8 C)   SpO2: 95%       In: 10 [I.V.:10]  Out: 0     Restroom use offered before discharge.  Yes, voided  Pain assessment:  None, no nausea, no dizziness, no questions re dc instructions  Pain Level: 0        Patient discharged to home/self care. Patient discharged via wheel chair by transporter to waiting family/S.O.       08/30/2017 4:38 PM

## 2017-08-30 NOTE — Progress Notes (Signed)
MRI questionnaire completed.

## 2017-08-30 NOTE — Progress Notes (Signed)
PACU Transfer to SDS    Vitals:    08/30/17 1600   BP: 123/79   Pulse: 94   Resp: 22   Temp: 98.3 F (36.8 C)   SpO2: 95%         Intake/Output Summary (Last 24 hours) at 08/30/17 1602  Last data filed at 08/30/17 1601   Gross per 24 hour   Intake               10 ml   Output                0 ml   Net               10 ml       Pain assessment:  none       Patient transferred to care of SDS RN.    08/30/2017 4:02 PM

## 2017-08-30 NOTE — Anesthesia Pre-Procedure Evaluation (Signed)
Department of Anesthesiology  Preprocedure Note       Name:  David Watkins   Age:  61 y.o.  DOB:  06/04/1956                                          MRN:  [id number]         Date:  08/30/2017      Surgeon: * No surgeons listed *    Procedure: MRI LUMBAR SPINE W WO CONTRAST    Medications prior to admission:   Prior to Admission medications    Medication Sig Start Date End Date Taking? Authorizing Provider   oxyCODONE-acetaminophen (PERCOCET) 7.5-325 MG per tablet Take 1 tablet by mouth every 4 hours as needed for Pain..   Yes Historical Provider, MD   traZODone (DESYREL) 50 MG tablet Take 50 mg by mouth nightly   Yes Historical Provider, MD   sertraline (ZOLOFT) 50 MG tablet Take 50 mg by mouth daily   Yes Historical Provider, MD   gabapentin (NEURONTIN) 300 MG capsule Take 1 capsule by mouth 3 times daily for 30 days.. 05/01/17 08/30/17 Yes Jannette Spanner, MD   pantoprazole (PROTONIX) 40 MG tablet Take 1 tablet by mouth every morning (before breakfast) 05/01/17  Yes Jannette Spanner, MD   Fluticasone-Salmeterol ([place]) Inhale into the lungs   Yes Historical Provider, MD   atorvastatin (LIPITOR) 10 MG tablet Take 10 mg by mouth daily   Yes Historical Provider, MD   valACYclovir (VALTREX) 500 MG tablet Take 500 mg by mouth 2 times daily   Yes Historical Provider, MD   ondansetron (ZOFRAN) 4 MG tablet Take 1 tablet by mouth every 6 hours as needed for Nausea or Vomiting 05/29/17   Marianne Sofia, MD   ondansetron (ZOFRAN) 4 MG tablet Take 1 tablet by mouth every 6 hours as needed for Nausea or Vomiting 05/29/17   Marianne Sofia, MD   prochlorperazine (COMPAZINE) 10 MG tablet Take 1 tablet by mouth every 6 hours as needed (Nausea) 05/01/17   Jannette Spanner, MD       Current medications:    Current Outpatient Prescriptions   Medication Sig Dispense Refill   . oxyCODONE-acetaminophen (PERCOCET) 7.5-325 MG per tablet Take 1 tablet by mouth every 4 hours as needed for Pain..     .  traZODone (DESYREL) 50 MG tablet Take 50 mg by mouth nightly     . sertraline (ZOLOFT) 50 MG tablet Take 50 mg by mouth daily     . gabapentin (NEURONTIN) 300 MG capsule Take 1 capsule by mouth 3 times daily for 30 days.. 90 capsule 0   . pantoprazole (PROTONIX) 40 MG tablet Take 1 tablet by mouth every morning (before breakfast) 30 tablet 3   . Fluticasone-Salmeterol (ADVAIR DISKUS IN) Inhale into the lungs     . atorvastatin (LIPITOR) 10 MG tablet Take 10 mg by mouth daily     . valACYclovir (VALTREX) 500 MG tablet Take 500 mg by mouth 2 times daily     . ondansetron (ZOFRAN) 4 MG tablet Take 1 tablet by mouth every 6 hours as needed for Nausea or Vomiting 60 tablet 2   . ondansetron (ZOFRAN) 4 MG tablet Take 1 tablet by mouth every 6 hours as needed for Nausea or Vomiting 60 tablet 3   . prochlorperazine (COMPAZINE) 10 MG tablet Take 1  tablet by mouth every 6 hours as needed (Nausea) 90 tablet 3     No current facility-administered medications for this encounter.        Allergies:    Allergies   Allergen Reactions   . Lenalidomide Rash   . Pomalidomide Hives   . Ceclor [Cefaclor] Hives       Problem List:    Patient Active Problem List   Diagnosis Code   . Multiple myeloma not having achieved remission (HCC) C90.00   . 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 Multiple myeloma in remission (HCC) C90.01   . Moderate malnutrition (HCC) E44.0   . Open wound of left ear S01.302A   . Ear lesion H93.90   . Skin lesion L98.9   . 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 Bone pain    . Hyperlipidemia    . Low back pain    . Multiple myeloma ([place])    . Neuropathy     chemo induced, feet       Past Surgical History:        Procedure Laterality Date   . BONE MARROW BIOPSY     . BONE MARROW TRANSPLANT     . OTHER SURGICAL HISTORY Left 08/17/2016    trifusion cath placement   . PRE-MALIGNANT / BENIGN  SKIN LESION EXCISION Left 03/29/2017    EXCISE LESION LEFT EXTERNAL EAR WITH FROZEN SECTION, FULL THICKNESS SKIN GRAFT       Social History:    Social History   Substance Use Topics   . Smoking status: Former Smoker     Quit date: 11/25/2012   . Smokeless tobacco: Never Used   . Alcohol use No                                Counseling given: Not Answered      Vital Signs (Current):   Vitals:    08/30/17 1115   BP: 131/81   Pulse: 79   Resp: 16   Temp: 97.6 F (36.4 C)   TempSrc: Oral   SpO2: 94%   Weight: 195 lb (88.5 kg)   Height: 5' 9"  (1.753 m)                                              BP Readings from Last 3 Encounters:   08/30/17 131/81   08/18/17 125/75   08/18/17 108/64       NPO Status: Time of last liquid consumption: 2259                        Time of last solid consumption: 2359                        Date of last liquid consumption: 08/29/17                        Date of last solid food consumption: 08/29/17    BMI:   Wt Readings from Last 3 Encounters:   08/30/17 195  lb (88.5 kg)   08/18/17 195 lb (88.5 kg)   08/16/17 180 lb (81.6 kg)     Body mass index is 28.8 kg/m.    CBC:   Lab Results   Component Value Date    WBC 6.7 06/21/2017    RBC 2.08 06/21/2017    RBC 3.30 04/24/2016    HGB 6.6 06/21/2017    HCT 19.3 06/21/2017    MCV 92.7 06/21/2017    RDW 19.9 06/21/2017    PLT 74 06/21/2017       CMP:   Lab Results   Component Value Date    NA 138 05/29/2017    K 4.2 05/29/2017    CL 103 05/29/2017    CO2 22 05/29/2017    BUN 22 05/29/2017    CREATININE 0.8 05/29/2017    GFRAA >60 05/29/2017    AGRATIO 1.3 08/19/2016    LABGLOM >60 05/29/2017    GLUCOSE 193 05/29/2017    GLUCOSE 110 04/24/2016    PROT 6.2 05/29/2017    PROT 6.6 04/24/2016    CALCIUM 7.3 05/29/2017    BILITOT 0.4 05/29/2017    ALKPHOS 218 05/29/2017    AST 48 05/29/2017    ALT 75 05/29/2017       POC Tests: No results for input(s): POCGLU, POCNA, POCK, POCCL, POCBUN, POCHEMO, POCHCT in the last 72 hours.    Coags:   Lab Results    Component Value Date    PROTIME 12.3 06/21/2017    INR 1.08 06/21/2017    APTT 27.4 05/27/2017       HCG (If Applicable): No results found for: PREGTESTUR, PREGSERUM, HCG, HCGQUANT     ABGs: No results found for: PHART, PO2ART, PCO2ART, HCO3ART, BEART, O2SATART     Type & Screen (If Applicable):  No results found for: LABABO, [facility]    Anesthesia Evaluation  Patient summary reviewed and Nursing notes reviewed  Airway: Mallampati: II  TM distance: >3 FB   Neck ROM: full  Mouth opening: > = 3 FB Dental:          Pulmonary:   (+) COPD:                             Cardiovascular:Negative CV ROS                      Neuro/Psych:   Negative Neuro/Psych ROS              GI/Hepatic/Renal: Neg GI/Hepatic/Renal ROS            Endo/Other: Negative Endo/Other ROS                    Abdominal:           Vascular: negative vascular ROS.                                       Anesthesia Plan      general     ASA 33     (61 year old male presents for MRI of the lumbar spine.  Plan general anesthesia with ASA standard monitors.  Questions answered.  Patient agreeable with anesthetic plan.  )  Induction: intravenous.      Anesthetic plan and risks discussed with patient.      Plan discussed with CRNA.  Attending anesthesiologist reviewed and agrees with Pre Eval content        Windy Kalata, MD   08/30/2017

## 2017-09-03 MED FILL — LIDOCAINE HCL (CARDIAC) 20 MG/ML IV SOLN: 20 mg/mL | INTRAVENOUS | Qty: 5

## 2017-09-03 MED FILL — GLYCOPYRROLATE 0.4 MG/2ML IJ SOLN: 0.4 MG/2ML | INTRAMUSCULAR | Qty: 2

## 2017-09-03 MED FILL — PHENYLEPHRINE HCL 10 MG/ML IJ SOLN: 10 mg/mL | INTRAMUSCULAR | Qty: 1

## 2017-09-03 MED FILL — PROPOFOL 200 MG/20ML IV EMUL: 200 MG/20ML | INTRAVENOUS | Qty: 40

## 2017-10-05 ENCOUNTER — Encounter

## 2017-10-05 ENCOUNTER — Inpatient Hospital Stay: Admit: 2017-10-05 | Payer: BLUE CROSS/BLUE SHIELD | Primary: Hematology & Oncology

## 2017-10-05 ENCOUNTER — Inpatient Hospital Stay: Payer: BLUE CROSS/BLUE SHIELD | Primary: Hematology & Oncology

## 2017-10-05 DIAGNOSIS — R0600 Dyspnea, unspecified: Secondary | ICD-10-CM

## 2017-10-05 DIAGNOSIS — R0689 Other abnormalities of breathing: Secondary | ICD-10-CM

## 2017-10-08 ENCOUNTER — Inpatient Hospital Stay: Admit: 2017-10-08 | Payer: BLUE CROSS/BLUE SHIELD | Primary: Hematology & Oncology

## 2017-10-08 ENCOUNTER — Encounter

## 2017-10-08 DIAGNOSIS — C9000 Multiple myeloma not having achieved remission: Secondary | ICD-10-CM

## 2017-10-08 MED ORDER — IOPAMIDOL 76 % IV SOLN
76 % | Freq: Once | INTRAVENOUS | Status: AC | PRN
Start: 2017-10-08 — End: 2017-10-08
  Administered 2017-10-08: 19:00:00 80 mL via INTRAVENOUS

## 2017-10-12 ENCOUNTER — Inpatient Hospital Stay: Admit: 2017-10-12 | Payer: BLUE CROSS/BLUE SHIELD | Primary: Hematology & Oncology

## 2017-10-12 DIAGNOSIS — Z452 Encounter for adjustment and management of vascular access device: Secondary | ICD-10-CM

## 2017-10-12 LAB — PREPARE PLATELETS: Dispense Status Blood Bank: TRANSFUSED

## 2017-10-12 LAB — RAPID INFLUENZA A/B ANTIGENS
Rapid Influenza A Ag: POSITIVE — AB
Rapid Influenza B Ag: NEGATIVE

## 2017-10-12 MED ORDER — DIPHENHYDRAMINE HCL 25 MG PO TABS
25 MG | Freq: Four times a day (QID) | ORAL | Status: DC | PRN
Start: 2017-10-12 — End: 2017-10-13
  Administered 2017-10-12: 21:00:00 25 mg via ORAL

## 2017-10-12 MED ORDER — HEPARIN SOD (PORK) LOCK FLUSH 100 UNIT/ML IV SOLN
100 UNIT/ML | INTRAVENOUS | Status: DC | PRN
Start: 2017-10-12 — End: 2017-10-13
  Administered 2017-10-12: 300 [IU]

## 2017-10-12 MED FILL — BENADRYL ALLERGY 25 MG PO TABS: 25 mg | ORAL | Qty: 1

## 2017-10-12 MED FILL — HEPARIN LOCK FLUSH 100 UNIT/ML IV SOLN: 100 [IU]/mL | INTRAVENOUS | Qty: 5

## 2017-10-12 NOTE — Progress Notes (Signed)
Rapid flu done per protocol. Specimen sent to lab.

## 2017-10-12 NOTE — Progress Notes (Signed)
Patient's hemoglobin this AM: 11.0 g/dL.  Patient's platelet count this AM: 12 k/uL.  Pt seen and assessed at [facility].  Seen at [facility] today for platelet transfusion per standing orders for above lab values.      While patient was here, it was determined that he required a rapid flu swab, ordered by Ronnette Hila, PA-C, and the result was positive.  KB notified.    Blood products transfused per [facility] policy.  Pt tolerated transfusion well and without incident.  Pt verbalizes understanding of discharge instructions.  Discharged ambulatory to home, unaccompanied.

## 2017-10-12 NOTE — Plan of Care (Signed)
Problem: KNOWLEDGE DEFICIT  Goal: Patient/S.O. demonstrates understanding of disease process, treatment plan, medications, and discharge instructions.  Outcome: Ongoing      Problem: DISCHARGE BARRIERS  Goal: Patient's continuum of care needs are met  Outcome: Ongoing

## 2017-10-13 ENCOUNTER — Ambulatory Visit: Payer: BLUE CROSS/BLUE SHIELD | Primary: Hematology & Oncology

## 2017-10-13 NOTE — Telephone Encounter (Signed)
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  My follow-up of his chart has indicated that he is apparently not in remission from his multiple myeloma.  Obviously, this is the most serious part of his current therapy.

## 2017-10-18 ENCOUNTER — Inpatient Hospital Stay: Admit: 2017-10-18 | Discharge: 2017-10-18 | Payer: BLUE CROSS/BLUE SHIELD | Primary: Hematology & Oncology

## 2017-10-18 DIAGNOSIS — C9000 Multiple myeloma not having achieved remission: Secondary | ICD-10-CM

## 2017-10-18 LAB — CBC WITH AUTO DIFFERENTIAL
Basophils %: 0 %
Basophils Absolute: 0 10*3/uL (ref 0.0–0.2)
Eosinophils %: 0 %
Eosinophils Absolute: 0 10*3/uL (ref 0.0–0.6)
Hematocrit: 36.4 % — ABNORMAL LOW (ref 40.5–52.5)
Hemoglobin: 12 g/dL — ABNORMAL LOW (ref 13.5–17.5)
Lymphocytes %: 3 %
Lymphocytes Absolute: 0.1 10*3/uL — ABNORMAL LOW (ref 1.0–5.1)
MCH: 32 pg (ref 26.0–34.0)
MCHC: 33 g/dL (ref 31.0–36.0)
MCV: 96.8 fL (ref 80.0–100.0)
MPV: 7.3 fL (ref 5.0–10.5)
Monocytes %: 12 %
Monocytes Absolute: 0.6 10*3/uL (ref 0.0–1.3)
Neutrophils %: 85 %
Neutrophils Absolute: 4 10*3/uL (ref 1.7–7.7)
PLATELET SLIDE REVIEW: DECREASED
Platelets: 15 10*3/uL — CL (ref 135–450)
RBC: 3.76 M/uL — ABNORMAL LOW (ref 4.20–5.90)
RDW: 16.5 % — ABNORMAL HIGH (ref 12.4–15.4)
WBC: 4.7 10*3/uL (ref 4.0–11.0)

## 2017-10-18 LAB — PREPARE PLATELETS: Dispense Status Blood Bank: TRANSFUSED

## 2017-10-18 MED ORDER — DIPHENHYDRAMINE HCL 25 MG PO TABS
25 MG | Freq: Four times a day (QID) | ORAL | Status: DC | PRN
Start: 2017-10-18 — End: 2017-10-19
  Administered 2017-10-18: 16:00:00 25 mg via ORAL

## 2017-10-18 MED ORDER — FENTANYL CITRATE (PF) 100 MCG/2ML IJ SOLN
100 MCG/2ML | Freq: Once | INTRAMUSCULAR | Status: AC
Start: 2017-10-18 — End: 2017-10-18
  Administered 2017-10-18: 14:00:00 75 ug via INTRAVENOUS

## 2017-10-18 MED ORDER — MIDAZOLAM HCL 2 MG/2ML IJ SOLN
2 MG/ML | Freq: Once | INTRAMUSCULAR | Status: AC
Start: 2017-10-18 — End: 2017-10-18
  Administered 2017-10-18: 14:00:00 3 mg via INTRAVENOUS

## 2017-10-18 MED ORDER — NALOXONE HCL 0.4 MG/ML IJ SOLN
0.4 MG/ML | Freq: Once | INTRAMUSCULAR | Status: DC
Start: 2017-10-18 — End: 2017-10-19

## 2017-10-18 MED ORDER — ACETAMINOPHEN 325 MG PO TABS
325 MG | Freq: Once | ORAL | Status: AC | PRN
Start: 2017-10-18 — End: 2017-10-18
  Administered 2017-10-18: 16:00:00 650 mg via ORAL

## 2017-10-18 MED ORDER — MIDAZOLAM HCL 5 MG/ML IJ SOLN
5 MG/ML | Freq: Once | INTRAMUSCULAR | Status: AC | PRN
Start: 2017-10-18 — End: 2017-10-18
  Administered 2017-10-18: 14:00:00 3 via INTRAVENOUS

## 2017-10-18 MED ORDER — HEPARIN SOD (PORK) LOCK FLUSH 100 UNIT/ML IV SOLN
100 UNIT/ML | INTRAVENOUS | Status: DC | PRN
Start: 2017-10-18 — End: 2017-10-19
  Administered 2017-10-18: 17:00:00 500 [IU]

## 2017-10-18 MED ORDER — FLUMAZENIL 0.5 MG/5ML IV SOLN
0.5 MG/5ML | Freq: Once | INTRAVENOUS | Status: DC
Start: 2017-10-18 — End: 2017-10-19

## 2017-10-18 MED ORDER — FENTANYL CITRATE (PF) 100 MCG/2ML IJ SOLN
100 MCG/2ML | Freq: Once | INTRAMUSCULAR | Status: AC | PRN
Start: 2017-10-18 — End: 2017-10-18
  Administered 2017-10-18: 14:00:00 75 via INTRAVENOUS

## 2017-10-18 MED ORDER — ALBUTEROL SULFATE (2.5 MG/3ML) 0.083% IN NEBU
Freq: Four times a day (QID) | RESPIRATORY_TRACT | Status: DC | PRN
Start: 2017-10-18 — End: 2017-10-19
  Administered 2017-10-18: 15:00:00 2.5 mg via RESPIRATORY_TRACT

## 2017-10-18 MED FILL — MIDAZOLAM HCL 2 MG/2ML IJ SOLN: 2 mg/mL | INTRAMUSCULAR | Qty: 6

## 2017-10-18 MED FILL — FLUMAZENIL 0.5 MG/5ML IV SOLN: 0.5 MG/5ML | INTRAVENOUS | Qty: 5

## 2017-10-18 MED FILL — FENTANYL CITRATE (PF) 100 MCG/2ML IJ SOLN: 100 MCG/2ML | INTRAMUSCULAR | Qty: 2

## 2017-10-18 MED FILL — BENADRYL ALLERGY 25 MG PO TABS: 25 mg | ORAL | Qty: 1

## 2017-10-18 MED FILL — NALOXONE HCL 0.4 MG/ML IJ SOLN: 0.4 mg/mL | INTRAMUSCULAR | Qty: 1

## 2017-10-18 MED FILL — HEPARIN LOCK FLUSH 100 UNIT/ML IV SOLN: 100 [IU]/mL | INTRAVENOUS | Qty: 5

## 2017-10-18 MED FILL — ACETAMINOPHEN 325 MG PO TABS: 325 mg | ORAL | Qty: 2

## 2017-10-18 NOTE — Plan of Care (Signed)
Problem: Falls - Risk of:  Goal: Will remain free from falls  Will remain free from falls     Outcome: Met This Shift    Explained fall risk precautions to pt & wife and rationale behind their use to keep the patient safe. Belongings are in reach. Pt encouraged to notify staff for any and all assistance. Staff present in tx suite throughout entirety of pts treatment to monitor and protect from falls.  Assistance provided when ambulating to restroom utilizing Stay With Me.        Problem: Bleeding:  Goal: Will show no signs and symptoms of excessive bleeding  Will show no signs and symptoms of excessive bleeding   Outcome: Met This Shift  Pt platelet count 15.  Single donor platelets infused and tolerated well.    Pt educated/reminded about bleeding precautions.  Pt verbalizes understanding.  No signs of active bleeding this shift. Discharged ambulatory to home with wife.

## 2017-10-18 NOTE — Progress Notes (Signed)
ETCO2  Monitoring done for conscious sedation.  Patient is on 2 liters/min via nasal cannula for procedure.    Baseline information:  HR: 84 BP: 127/85  RR: 10 LOC: alert  ETCO2: 36 SpO2: 97    5 minutes after sedation administered:  HR: 84 BP: 149/95  RR: 16 LOC: lethargic  ETCO2: 32 SpO2: 98    10 min after sedation administered:  HR: 87BP: 131/90  RR: 15 LOC: obtunded  ETCO2: 33SpO2: 95    20 minutes after sedation administered:  HR: 88 BP: 137/92  RR: 13 LOC: lethargic  ETCO2: 33 SpO2: 95    Post-Procedure:  HR: 94 BP: 137/85  RR: 10 LOC: alert  ETCO2: 34 SpO2: 99  well    Patient/caregiver was educated on the proper method of use:  Yes      Level of patient/caregiver understanding able to:   []  Verbalize understanding   []  Demonstrate understanding       []  Teach back        []  Needs reinforcement       []   No available caregiver               []   Other:     Response to education:  Very Good

## 2017-10-18 NOTE — Procedures (Signed)
Bone Marrow Core & Aspirate with Moderate Sedation    Diagnosis: Myeloma  Indication: Restaging  Planned Sedation:  Fentanyl: Fentanyl 50 mcg  Versed: 4 mg  Ativan: 0  Pre-Procedure ASA Score: Class II - mild to moderate systemic disturbance, well-controlled  Airway Assessment Score: II (soft palate, uvula, fauces visible)    Consent:   Consent was obtained from the patient by:   1. Explaining the risks associated with biopsy [bleeding and bruising] and moderate sedation [respiratory depression and sedation], as well as the benefits [an understanding of the current diagnosis to make treatment decisions], and alternatives to biopsy with sedation.  2. The patient voiced an understanding of the risks/benefits and the answers to their questions, then proceeded to sign and date the consent form.     Assessment:  Patient was assessed by myself prior to initiation of sedation and procedure and is deemed medically fit to undergo the procedure.  Recent H&P, current medications, and allergies are on the chart and reviewed.  Time out performed.    Procedure:  Patient placed in the left lateral decubitus position.  The area was prepped with chloraprep. Sterile drapes were applied. A puncture was made with the provided scalpel, then using a Jamshidi needle a bone marrow aspirate was taken from the right posterior iliac crest.  Then using an 8 G 6" needle a core biopsy was obtained. A sterile bandage was applied.  The patient had no significant bleeding.  The sample was sent for histology, flow cytometry and cytogenetics.     Patient tolerated well, no complications. Patient may be discharged from outpt oncology following procedure once Aldrete Score is at least 8 or meets pre-procedure Aldrete Score.  Nursing to notify physician if pt doesn't meet criteria for discharge.    Nikki Dom Clytie Shetley  [facility]  [phone]

## 2017-10-18 NOTE — Progress Notes (Signed)
Pt scheduled for bone marrow biopsy with conscious sedation for diagnosis of Multiple Myeloma.  Pt's history, allergies, and medication list reviewed by nursing and physician prior to start of procedure.  Pt assessed and deemed fit for procedure by physician.    :       Pre-Procedural Assessment:  Signed, Dated, and timed Informed Consent on the chart  VS's obtained and documented  Pt has a patent IV site (see flowsheets)  Pt has been NPO since Midnight  Current Medications, Allergies, and recent H&P reviewed and on chart  Emergency medications and equipment available (crash cart, narcan, flumazenil, O2, suction, etc)  Time out performed prior to procedure (sePost-Procedure:  Pt vital signs stable.  Discharge teaching provided (see AVS).  Post procedure Aldrete Score 10.  Pt and family verbalized understanding of all instructions.  Pt discharged from [place] with wife who will drive them home.e sedation documentation)  Pre-Procedure Aldrete Score: 10    Procedure:  Nursing present throughout procedure to assess, assist, and monitor.  Vital Signs monitored throughout - see sedation documentation. Patient to be discharged from OPO once Aldrete Score of 8 or better is achieved or once pre-procedural Aldrete Score is obtained.

## 2017-10-18 NOTE — Progress Notes (Signed)
Patient arrived ambulatory to Outpatient infusion for bone marrow biopsy.  Alert, oriented, vss.  Has been NPO since midnight.  Port accessed without difficulty.  Labs drawn.  Consent signed for biopsy.

## 2017-10-18 NOTE — Progress Notes (Addendum)
Post-Procedure:  Pt vital signs stable.  Discharge teaching provided (see AVS).  Post procedure Aldrete Score 10.  Pt and family verbalized understanding of all instructions.  Pt moved to chair for platelet infusion prior to discharge.

## 2017-10-22 ENCOUNTER — Ambulatory Visit: Admit: 2017-10-22 | Payer: BLUE CROSS/BLUE SHIELD | Attending: Pulmonary Disease | Primary: Hematology & Oncology

## 2017-10-22 DIAGNOSIS — C9000 Multiple myeloma not having achieved remission: Secondary | ICD-10-CM

## 2017-10-22 MED ORDER — AZITHROMYCIN 250 MG PO TABS
250 MG | ORAL_TABLET | ORAL | 0 refills | Status: AC
Start: 2017-10-22 — End: 2017-10-27

## 2017-10-22 MED ORDER — PREDNISONE 20 MG PO TABS
20 MG | ORAL_TABLET | ORAL | 0 refills | Status: DC
Start: 2017-10-22 — End: 2017-11-29

## 2017-10-22 NOTE — Progress Notes (Signed)
Subjective:      Patient ID: David Watkins is a 62 y.o. male.    HPI  David Watkins is referred by Dr Viona Gilmore because of acute, severe shortness of breath.  This began after he developed an acute lower respiratory infection with cough productive of purulent sputum, about 3 weeks ago.  At time he had just returned from a trip to [place], where he had exposure to severely cold weather.  He also traveled by plane.  It isn't clear whether he had exposure to anyone with an acute respiratory illness at that time.  He continues to cough, quite excessively at times but is not producing much sputum.  He becomes very short of breath, particularly with coughing episodes and with walking even short distances.  He is not sleeping well because of cough and dyspnea.  He has developed a lot of chest and epigastric soreness from coughing.  He has not had fevers or chills that he is aware of.  He was given a course of modest dose steroid, without much effect.  He was started on a nebulizer with albuterol a few days ago, with small degree of improvement after treatments.  He had been treated in the past with an Advair inhaler but stopped using this well over a year ago.  He has continued to work this past week, although was not very productive.  Part of his complaint is having a lot of fatigue, which is not new to this current illness but is related to his multiple myeloma.    Review of Systems    Objective:   Physical Exam   Constitutional: He is oriented to person, place, and time. He appears well-developed and well-nourished.   HENT:   Head: Normocephalic and atraumatic.   Mouth/Throat: Oropharynx is clear and moist. No oropharyngeal exudate.   Eyes: Conjunctivae are normal. Right eye exhibits no discharge. No scleral icterus.   Neck: No tracheal deviation present. No thyromegaly present.   Cardiovascular: Normal rate, regular rhythm, normal heart sounds and intact distal pulses.    No murmur heard.  Pulmonary/Chest: Effort  normal. No respiratory distress. He has decreased breath sounds in the right upper field, the right middle field, the right lower field, the left upper field, the left middle field and the left lower field. He has rales in the right lower field and the left lower field.   Scattered expiratory wheezes.  Rhonchi anteriorly   Musculoskeletal: He exhibits no edema.   Lymphadenopathy:     He has no cervical adenopathy.   Neurological: He is alert and oriented to person, place, and time.   Skin: Skin is warm and dry.   Psychiatric: He has a normal mood and affect. His behavior is normal. Judgment and thought content normal.     Chest CT scan dated 10/08/17 is reviewed.  There is mild to moderate emphysema, minimal tree-in-bud opacities in upper lung zones.  No parenchymal infiltrates or pleural changes.    Assessment:      Acute illness with progressive cough and dyspnea is consistent with an acute exacerbation of COPD.  He has previously been seen to have severe airflow obstruction on PFT.  This likely started as a consequence of a viral respiratory infection.  There may or may not be bacterial superinfection.  He has responded very modestly to inhaled bronchodilator.  Multiple myeloma, relapsed, on active therapy.      Plan:      Prednisone 12 day course starting at  60 mg, dropping every 4 days by 20 mg.  Azithromycin 6 days  Continue nebulizers with albuterol every 4 hours during the day.  He will call if not making significant progress over the next several days        Charleen Kirks, MD

## 2017-11-03 ENCOUNTER — Ambulatory Visit: Payer: BLUE CROSS/BLUE SHIELD | Primary: Hematology & Oncology

## 2017-11-08 ENCOUNTER — Inpatient Hospital Stay: Payer: BLUE CROSS/BLUE SHIELD | Primary: Hematology & Oncology

## 2017-11-08 DIAGNOSIS — Z529 Donor of unspecified organ or tissue: Secondary | ICD-10-CM

## 2017-11-08 NOTE — Progress Notes (Signed)
Transplant consultation visit-Allogeneic   I met with David Watkins and his significant other David Watkins to discuss the preliminary information about allogeneic stem cell transplantation.  Diagnosis Plasma cell leukemia /Multiple myeloma.  His current therapy is Daratumumab/Velcade/Dex/ Zometa he is Cycle 7 day 1 today.  He has 3 full siblings to tissue type.  We reviewed  1.  The fundamentals of stem cells, the types of stem cell transplants and their mechanism to treat disease.  2. The donor identification process.  I have reviewed and given them the transplant process outline and the allogeneic transplant patient's booklet.   He will gather his siblings information email it to me.  They have given me permission to contact their insurance company to verify insurance coverage for transplant at [facility].  I have answered their questions, they verbalized understanding and agree to the plan.  Marcy Panning RN BSN OCN [facility]  Transplant coordinator

## 2017-11-10 ENCOUNTER — Ambulatory Visit: Payer: BLUE CROSS/BLUE SHIELD | Primary: Hematology & Oncology

## 2017-11-11 NOTE — Progress Notes (Signed)
PRE-OP INSTRUCTIONS FOR THE SURGICAL PATIENT YOU ARE UNABLE TO MAKE CONTACT FOR AN INTERVIEW:      1. Follow instructions for your ARRIVAL TIME as DIRECTED BY YOUR SURGEON.   2. Enter the MAIN entrance located on [address] and report to the desk.   3. Bring your insurance & prescription card and photo ID with you. You may also be asked to pay a co-pay, as you may want to bring a check or credit card with you.   4. Leave all other valuables at home.               5. Arrange for someone to drive you home and be with you for the first 24 hours after discharge.  6. You must contact your surgeon for ALL medication instructions, especially if taking blood thinners, aspirin, or diabetic medication.  7. A Pre-op History and Physical for surgery MUST be completed by your Physician or an Urgent Care within 30 days of your procedure date.  Please bring a copy with you on the day of your procedure and along with any other testing performed.  8. DO NOT EAT ANYTHING eight hours prior to surgery.  May have up to 8 ounces of water 4 hours prior to surgery.   9. No gum, candy, mints, or ice chips day of procedure.   10. Please refrain from drinking alcohol the day before or day of your procedure.   11. Please do not smoke the day of your procedure.    12. Dress in loose, comfortable clothing appropriate for redressing after your procedure. Do not wear jewelry (including body piercings), make-up, fingernail polish, lotion, powders or metal hairclips.   13. Contacts will need to be removed prior to surgery.  You may want to bring your            Eye glasses to wear immediately before and after surgery.  14. Dentures will need to be removed before your procedure.   15. Bring cases for your glasses, contacts, dentures, or hearing aids to protect them              while you are in surgery.    16. If you use a CPAP, please bring it with you on the day of your procedure.  17. Do not shave or wax for 72 hours prior to procedure  near your operative site  18. FOR WOMAN OF CHILDBEARING AGE ONLY- please bring a urine sample with           you on day of surgery or make sure we can collect on arrival.    If you have further questions, you may contact us at [phone]    Left instructions on patient's voicemail.    Elenora Gamma, RN.11/11/2017 .9:13 AM

## 2017-11-12 ENCOUNTER — Inpatient Hospital Stay: Payer: BLUE CROSS/BLUE SHIELD | Primary: Hematology & Oncology

## 2017-11-15 ENCOUNTER — Inpatient Hospital Stay: Admit: 2017-11-15 | Discharge: 2017-11-15 | Payer: BLUE CROSS/BLUE SHIELD | Primary: Hematology & Oncology

## 2017-11-15 ENCOUNTER — Encounter

## 2017-11-15 DIAGNOSIS — Z01818 Encounter for other preprocedural examination: Secondary | ICD-10-CM

## 2017-11-15 DIAGNOSIS — C9000 Multiple myeloma not having achieved remission: Secondary | ICD-10-CM

## 2017-11-15 MED ORDER — HYDROMORPHONE HCL 1 MG/ML IJ SOLN
1 MG/ML | INTRAMUSCULAR | Status: DC | PRN
Start: 2017-11-15 — End: 2017-11-16

## 2017-11-15 MED ORDER — LABETALOL HCL 5 MG/ML IV SOLN
5 MG/ML | INTRAVENOUS | Status: DC | PRN
Start: 2017-11-15 — End: 2017-11-16

## 2017-11-15 MED ORDER — FENTANYL CITRATE (PF) 100 MCG/2ML IJ SOLN
100 MCG/2ML | INTRAMUSCULAR | Status: DC | PRN
Start: 2017-11-15 — End: 2017-11-16

## 2017-11-15 MED ORDER — DEXAMETHASONE SODIUM PHOSPHATE 4 MG/ML IJ SOLN
4 | INTRAMUSCULAR | Status: AC
Start: 2017-11-15 — End: 2017-11-15

## 2017-11-15 MED ORDER — MIDAZOLAM HCL 2 MG/2ML IJ SOLN
2 MG/ML | Freq: Once | INTRAMUSCULAR | Status: AC
Start: 2017-11-15 — End: 2017-11-15

## 2017-11-15 MED ORDER — OXYCODONE-ACETAMINOPHEN 5-325 MG PO TABS
5-325 MG | Freq: Once | ORAL | Status: AC | PRN
Start: 2017-11-15 — End: 2017-11-15

## 2017-11-15 MED ORDER — LIDOCAINE HCL (CARDIAC) 20 MG/ML IV SOLN
20 | INTRAVENOUS | Status: AC
Start: 2017-11-15 — End: 2017-11-15

## 2017-11-15 MED ORDER — ONDANSETRON HCL 4 MG/2ML IJ SOLN
4 MG/2ML | Freq: Once | INTRAMUSCULAR | Status: AC | PRN
Start: 2017-11-15 — End: 2017-11-15

## 2017-11-15 MED ORDER — PROMETHAZINE HCL 25 MG/ML IJ SOLN
25 MG/ML | Freq: Once | INTRAMUSCULAR | Status: AC | PRN
Start: 2017-11-15 — End: 2017-11-15

## 2017-11-15 MED ORDER — LACTATED RINGERS IV SOLN
INTRAVENOUS | Status: DC
Start: 2017-11-15 — End: 2017-11-16

## 2017-11-15 MED ORDER — PROPOFOL 200 MG/20ML IV EMUL
200 | INTRAVENOUS | Status: AC
Start: 2017-11-15 — End: 2017-11-15

## 2017-11-15 MED ORDER — LACTATED RINGERS IV SOLN
INTRAVENOUS | Status: DC
Start: 2017-11-15 — End: 2017-11-16
  Administered 2017-11-15: 17:00:00 via INTRAVENOUS

## 2017-11-15 MED ORDER — MIDAZOLAM HCL 2 MG/2ML IJ SOLN
2 MG/ML | INTRAMUSCULAR | Status: AC
Start: 2017-11-15 — End: 2017-11-15
  Administered 2017-11-15: 17:00:00 1 via INTRAVENOUS

## 2017-11-15 MED ORDER — FENTANYL CITRATE (PF) 100 MCG/2ML IJ SOLN
100 | INTRAMUSCULAR | Status: AC
Start: 2017-11-15 — End: 2017-11-15

## 2017-11-15 MED ORDER — HYDRALAZINE HCL 20 MG/ML IJ SOLN
20 MG/ML | INTRAMUSCULAR | Status: DC | PRN
Start: 2017-11-15 — End: 2017-11-16

## 2017-11-15 MED ORDER — GADOTERIDOL 279.3 MG/ML IV SOLN
279.3 MG/ML | Freq: Once | INTRAVENOUS | Status: AC | PRN
Start: 2017-11-15 — End: 2017-11-15
  Administered 2017-11-15: 18:00:00 18 mL via INTRAVENOUS

## 2017-11-15 MED ORDER — GLYCOPYRROLATE 1 MG/5ML IV SOSY
1 | INTRAVENOUS | Status: AC
Start: 2017-11-15 — End: 2017-11-15

## 2017-11-15 MED ORDER — ONDANSETRON HCL 4 MG/2ML IJ SOLN
4 | INTRAMUSCULAR | Status: AC
Start: 2017-11-15 — End: 2017-11-15

## 2017-11-15 MED ORDER — MIDAZOLAM HCL 2 MG/2ML IJ SOLN
2 | INTRAMUSCULAR | Status: AC
Start: 2017-11-15 — End: 2017-11-15

## 2017-11-15 MED FILL — DEXAMETHASONE SODIUM PHOSPHATE 4 MG/ML IJ SOLN: 4 mg/mL | INTRAMUSCULAR | Qty: 1

## 2017-11-15 MED FILL — GLYCOPYRROLATE 1 MG/5ML IV SOSY: 1 MG/5ML | INTRAVENOUS | Qty: 5

## 2017-11-15 MED FILL — MIDAZOLAM HCL 2 MG/2ML IJ SOLN: 2 mg/mL | INTRAMUSCULAR | Qty: 2

## 2017-11-15 MED FILL — FENTANYL CITRATE (PF) 100 MCG/2ML IJ SOLN: 100 MCG/2ML | INTRAMUSCULAR | Qty: 2

## 2017-11-15 MED FILL — ONDANSETRON HCL 4 MG/2ML IJ SOLN: 4 MG/2ML | INTRAMUSCULAR | Qty: 2

## 2017-11-15 MED FILL — LIDOCAINE HCL (CARDIAC) 20 MG/ML IV SOLN: 20 mg/mL | INTRAVENOUS | Qty: 5

## 2017-11-15 MED FILL — PROPOFOL 200 MG/20ML IV EMUL: 200 MG/20ML | INTRAVENOUS | Qty: 40

## 2017-11-15 NOTE — Discharge Instructions (Signed)
[place] may be drowsy or lightheaded after receiving sedation or anesthesia. Do not operate any vehicles (automobiles, bicycles, motorcycles) or power tools or machinery for 24 hours.  Do not sign any legal documents or make any legal decisions for 24 hours. Do not drink alcohol for 24 hours or while taking narcotic pain medication.  A responsible person should be with you for the next 24 hours.    Please follow the instructions checked below:      DIET INSTRUCTIONS:  [x] Start with light diet and progress to your normal diet as you feel like eating. Nausea may be a side effect of anesthesia.  However, if you experience nausea or repeated episodes of vomiting which persist beyond 12-24 hours, notify your doctor.             MEDICATION INSTRUCTIONS:  [] Prescription(S) x     sent with you.  Use as directed.  When taking pain medications, you may experience the side effect of dizziness or drowsiness.  Do not drink alcohol or drive when taking these medications.  [] Prescription(S) x          Called to Pharmacy Name and location:    [x] Give the list of your medications to your primary care physician on your next visit. Keep your med list updated and carry it with in case of emergencies.    []  Narcotic pain medications can cause the side effect of significant constipation.  You may want to add a stool softener to your postoperative medication schedule or speak to your surgeon on how best to manage this side effect.    If you have a CPAP machine, it is very important that you use it daily during all periods of sleep and daytime rest during your recovery at home.  Surgery and Anesthesia place a significant amount of stress on your body.  Using your CPAP will help keep you safe and lessen the negative effects of that stress.    FOLLOW-UP RECOVERY CARE:  [x] Call you Primary Care Physician with any questions  Watch for these possible complications, symptoms, or side  effects of anesthesia.  Call physician if they or any other problems occur:  - Signs of INFECTION   > Fever over 101     > Redness, swelling, hardness or warmth at the operative site   >Foul smelling or cloudy drainage at the operative site   - Unrelieved PAIN  - Unrelieved NAUSEA  - Blood soaked dressing.  (Some oozing may be normal)  - Inability to urinate      - Numb, pale, blue, cold or tingling extremity      Physician: MRI    The above instructions were reviewed with patient/significant other.  The following additional patient specific information was reviewed with the patient/significant other:  [] Procedure/physician specific instructions  [] Medication information sheet(S)  [] Dionne's egress test  [] Pain Ball management  [] FAQ Catheter associated blood stream infections  [] FAQ Surgical Site Infections  [] Other-    I have read and understand the instructions given to me: ____________________________________________   (Patient/S.O. Signature)            Date/time 11/15/2017 2:57 PM         PACU:  [phone]   M-F 700 AM - 7 PM      SAME DAY SERVICES:  [phone] M-F 7AM-6PM        If you smoke STOP. We care about your health!

## 2017-11-15 NOTE — Progress Notes (Signed)
PACU Transfer to SDS    Vitals:    11/15/17 1509   BP: (!) 101/59   Pulse: 88   Resp: 17   Temp: 98.2 F (36.8 C)   SpO2: 98%   Within normal limits for patient      Intake/Output Summary (Last 24 hours) at 11/15/2017 1512  Last data filed at 11/15/2017 1511  Gross per 24 hour   Intake 90 ml   Output --   Net 90 ml       Pain assessment:  none  Pain Level: 5    Patient transferred to care of SDS RN.    11/15/2017 3:12 PM

## 2017-11-15 NOTE — Progress Notes (Signed)
Ambulatory Surgery/Procedure Discharge Note  Pt alert and oriented  Minimal pain, tolerable for discharge  IV dcd , fluids taken well   verbal and written discharge instructions given to pt and girlfriend [name]  No nausea  Discharged per wheelchair to car with [name] present    Vitals:    11/15/17 1509   BP: (!) 101/59   Pulse: 88   Resp: 17   Temp: 98.2 F (36.8 C)   SpO2: 98%       In: 90 [P.O.:50; I.V.:40]  Out: -     Restroom use offered before discharge.  Yes    Pain assessment:  level of pain (1-10, 10 severe),           Patient discharged to home/self care. Patient discharged via wheel chair by transporter to waiting family/S.O.       11/15/2017 3:34 PM

## 2017-11-15 NOTE — Progress Notes (Signed)
Patient arrived in pacu post MRI with sedation. Patient was connected to bedside monitors; VSS. Per CRNA patient was stable for procedure. Patient resting comfortably; remains sedated from anesthesia.

## 2017-11-15 NOTE — Anesthesia Pre-Procedure Evaluation (Signed)
Department of Anesthesiology  Preprocedure Note       Name:  David Watkins   Age:  62 y.o.  DOB:  01-30-1956                                          MRN:  [id number]         Date:  11/15/2017      Surgeon: * No surgeons listed *    Procedure: [facility] MRI W ANESTHESIA    Medications prior to admission:   Prior to Admission medications    Medication Sig Start Date End Date Taking? Authorizing Provider   albuterol sulfate HFA (PROAIR HFA) 108 (90 Base) MCG/ACT inhaler Inhale 2 puffs into the lungs every 6 hours as needed for Wheezing   Yes Historical Provider, MD   albuterol (PROVENTIL) (2.5 MG/3ML) 0.083% nebulizer solution  10/19/17   Historical Provider, MD   predniSONE (DELTASONE) 20 MG tablet 3 tabs daily x 4 days, 2 tabs x 4 days, 1 tab x 4 days 10/22/17   Darlyn Read, MD   Calcium 200 MG TABS Take 400 mg by mouth 2 times daily    Historical Provider, MD   levofloxacin (LEVAQUIN) 500 MG tablet Take 500 mg by mouth daily    Historical Provider, MD   oxyCODONE-acetaminophen (PERCOCET) 7.5-325 MG per tablet Take 1 tablet by mouth every 4 hours as needed for Pain.Marland Kitchen    Historical Provider, MD   ondansetron (ZOFRAN) 4 MG tablet Take 1 tablet by mouth every 6 hours as needed for Nausea or Vomiting 05/29/17   Terressa Koyanagi, MD   ondansetron (ZOFRAN) 4 MG tablet Take 1 tablet by mouth every 6 hours as needed for Nausea or Vomiting 05/29/17   Terressa Koyanagi, MD   traZODone (DESYREL) 50 MG tablet Take 50 mg by mouth nightly    Historical Provider, MD   sertraline (ZOLOFT) 50 MG tablet Take 50 mg by mouth daily    Historical Provider, MD   gabapentin (NEURONTIN) 300 MG capsule Take 1 capsule by mouth 3 times daily for 30 days.. 05/01/17 08/30/17  Lisette Grinder, MD   pantoprazole (PROTONIX) 40 MG tablet Take 1 tablet by mouth every morning (before breakfast) 05/01/17   Lisette Grinder, MD   prochlorperazine (COMPAZINE) 10 MG tablet Take 1 tablet by mouth every 6 hours as needed (Nausea) 05/01/17   Lisette Grinder, MD   Fluticasone-Salmeterol (ADVAIR DISKUS IN) Inhale into the lungs    Historical Provider, MD   atorvastatin (LIPITOR) 10 MG tablet Take 10 mg by mouth daily    Historical Provider, MD   valACYclovir (VALTREX) 500 MG tablet Take 500 mg by mouth 2 times daily    Historical Provider, MD       Current medications:    Current Outpatient Medications   Medication Sig Dispense Refill   . albuterol sulfate HFA (PROAIR HFA) 108 (90 Base) MCG/ACT inhaler Inhale 2 puffs into the lungs every 6 hours as needed for Wheezing     . albuterol (PROVENTIL) (2.5 MG/3ML) 0.083% nebulizer solution      . predniSONE (DELTASONE) 20 MG tablet 3 tabs daily x 4 days, 2 tabs x 4 days, 1 tab x 4 days 24 tablet 0   . Calcium 200 MG TABS Take 400 mg by mouth 2 times daily     . levofloxacin (LEVAQUIN)  500 MG tablet Take 500 mg by mouth daily     . oxyCODONE-acetaminophen (PERCOCET) 7.5-325 MG per tablet Take 1 tablet by mouth every 4 hours as needed for Pain..     . ondansetron (ZOFRAN) 4 MG tablet Take 1 tablet by mouth every 6 hours as needed for Nausea or Vomiting 60 tablet 2   . ondansetron (ZOFRAN) 4 MG tablet Take 1 tablet by mouth every 6 hours as needed for Nausea or Vomiting 60 tablet 3   . traZODone (DESYREL) 50 MG tablet Take 50 mg by mouth nightly     . sertraline (ZOLOFT) 50 MG tablet Take 50 mg by mouth daily     . gabapentin (NEURONTIN) 300 MG capsule Take 1 capsule by mouth 3 times daily for 30 days.. 90 capsule 0   . pantoprazole (PROTONIX) 40 MG tablet Take 1 tablet by mouth every morning (before breakfast) 30 tablet 3   . prochlorperazine (COMPAZINE) 10 MG tablet Take 1 tablet by mouth every 6 hours as needed (Nausea) 90 tablet 3   . Fluticasone-Salmeterol (ADVAIR DISKUS IN) Inhale into the lungs     . atorvastatin (LIPITOR) 10 MG tablet Take 10 mg by mouth daily     . valACYclovir (VALTREX) 500 MG tablet Take 500 mg by mouth 2 times daily       Current Facility-Administered Medications   Medication Dose Route  Frequency Provider Last Rate Last Dose   . lactated ringers infusion   Intravenous Continuous Weyman Croon, MD       . lactated ringers infusion   Intravenous Continuous Weyman Croon, MD           Allergies:    Allergies   Allergen Reactions   . Lenalidomide Rash   . Pomalidomide Hives   . Ceclor [Cefaclor] Hives       Problem List:    Patient Active Problem List   Diagnosis Code   . Multiple myeloma not having achieved remission (HCC) C90.00   . 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 Multiple myeloma in remission (HCC) C90.01   . Moderate malnutrition (HCC) E44.0   . Open wound of left ear S01.302A   . Ear lesion H93.90   . Skin lesion L98.9   . 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 COPD exacerbation (HCC) J44.1       Past Medical History:        Diagnosis Date   . Bone pain    . Hyperlipidemia    . Leukemia, plasma cell, in relapse (HCC)    . Low back pain    . Multiple myeloma (HCC)    . Neuropathy     chemo induced, feet       Past Surgical History:        Procedure Laterality Date   . BONE MARROW BIOPSY     . BONE MARROW TRANSPLANT     . OTHER SURGICAL HISTORY Left 08/17/2016    trifusion cath placement   . PRE-MALIGNANT / BENIGN SKIN LESION EXCISION Left 03/29/2017    EXCISE LESION LEFT EXTERNAL EAR WITH FROZEN SECTION, FULL THICKNESS SKIN GRAFT       Social History:    Social History     Tobacco Use   . Smoking status: Former Smoker     Last attempt to quit: 11/25/2012     Years since quitting: 4.9   .  Smokeless tobacco: Never Used   Substance Use Topics   . Alcohol use: No                                Counseling given: Not Answered      Vital Signs (Current):   Vitals:    11/15/17 1109   BP: 99/66   Pulse: 102   Resp: 16   Temp: 98.4 F (36.9 C)   TempSrc: Oral   SpO2: 95%   Weight: 197 lb (89.4 kg)   Height: 5\' 9"  (1.753 m)                                              BP Readings from Last 3 Encounters:   11/15/17  99/66   10/22/17 98/60   10/18/17 124/79       NPO Status: Time of last liquid consumption: 2100                        Time of last solid consumption: 2100                        Date of last liquid consumption: 11/14/17                        Date of last solid food consumption: 11/14/17    BMI:   Wt Readings from Last 3 Encounters:   11/15/17 197 lb (89.4 kg)   10/22/17 199 lb (90.3 kg)   08/30/17 195 lb (88.5 kg)     Body mass index is 29.09 kg/m.    CBC:   Lab Results   Component Value Date    WBC 4.7 10/18/2017    RBC 3.76 10/18/2017    RBC 3.30 04/24/2016    HGB 12.0 10/18/2017    HCT 36.4 10/18/2017    MCV 96.8 10/18/2017    RDW 16.5 10/18/2017    PLT 15 10/18/2017       CMP:   Lab Results   Component Value Date    NA 138 05/29/2017    K 4.2 05/29/2017    CL 103 05/29/2017    CO2 22 05/29/2017    BUN 22 05/29/2017    CREATININE 0.8 05/29/2017    GFRAA >60 05/29/2017    AGRATIO 1.3 08/19/2016    LABGLOM >60 05/29/2017    GLUCOSE 193 05/29/2017    GLUCOSE 110 04/24/2016    PROT 6.2 05/29/2017    PROT 6.6 04/24/2016    CALCIUM 7.3 05/29/2017    BILITOT 0.4 05/29/2017    ALKPHOS 218 05/29/2017    AST 48 05/29/2017    ALT 75 05/29/2017       POC Tests: No results for input(s): POCGLU, POCNA, POCK, POCCL, POCBUN, POCHEMO, POCHCT in the last 72 hours.    Coags:   Lab Results   Component Value Date    PROTIME 12.3 06/21/2017    INR 1.08 06/21/2017    APTT 27.4 05/27/2017       HCG (If Applicable): No results found for: PREGTESTUR, PREGSERUM, HCG, HCGQUANT     ABGs: No results found for: PHART, PO2ART, PCO2ART, HCO3ART, BEART, O2SATART     Type & Screen (If Applicable):  No results  found for: LABABO, LABRH    Anesthesia Evaluation    Airway: Mallampati: II  TM distance: >3 FB   Neck ROM: full  Mouth opening: > = 3 FB Dental:          Pulmonary: breath sounds clear to auscultation  (+) COPD: mild,      (-) sleep apnea and not a current smoker                          ROS comment: No oxygen use  rare inhaler use had a  recent spell of bronchitis  Recent steroid burst --------ended about 2 weeks ago   Cardiovascular:  Exercise tolerance: good (>4 METS),   (+) hyperlipidemia    (-) hypertension, past MI, CAD, CABG/stent, dysrhythmias,  angina,  CHF, orthopnea, PND and  DOE      Rhythm: regular  Rate: normal           Beta Blocker:  Not on Beta Blocker         Neuro/Psych:   (+) neuromuscular disease:, depression/anxiety    (-) seizures, TIA and CVA           GI/Hepatic/Renal:        (-) GERD, hepatitis, liver disease, no renal disease, bowel prep and no morbid obesity       Endo/Other:    (+) malignancy/cancer.    (-) diabetes mellitus, hypothyroidism, hyperthyroidism                ROS comment: MM with plasma leukemia diagnosed in 02/2016  Mets to spine recent radiation -stopped radiation about 1 month ago Abdominal:           Vascular:     - DVT and PE.                                    Anesthesia Plan      general     ASA 3       Induction: intravenous.      Anesthetic plan and risks discussed with patient.      Plan discussed with CRNA.                  Gwenyth Bender, MD   11/15/2017

## 2017-11-15 NOTE — Progress Notes (Signed)
Pt resting quietly after Versed given for anxiety waiting for MIR. Family member in room with pt also.

## 2017-11-15 NOTE — Anesthesia Post-Procedure Evaluation (Signed)
Department of Anesthesiology  Postprocedure Note    Patient: David Watkins  MRN: [id number]  Birthdate: December 17, 1955  Date of evaluation: 11/15/2017  Time:  8:01 PM     Procedure Summary     Date:  11/15/17 Room / Location:  [facility] MRI    Anesthesia Start:  0086 Anesthesia Stop:  1440    Procedure:  MRI THORACIC SPINE W WO CONTRAST Diagnosis:  Multiple myeloma, remission status unspecified ([place])    Scheduled Providers:   Responsible Provider:      Anesthesia Type:  general ASA Status:  3          Anesthesia Type: general    Aldrete Phase I: Aldrete Score: 5    Aldrete Phase II: Aldrete Score: 10    Last vitals: Reviewed and per EMR flowsheets.       Anesthesia Post Evaluation    Patient location during evaluation: PACU  Patient participation: complete - patient participated  Level of consciousness: awake and alert  Airway patency: patent  Nausea & Vomiting: no nausea and no vomiting  Cardiovascular status: blood pressure returned to baseline  Respiratory status: acceptable  Hydration status: euvolemic

## 2017-11-16 ENCOUNTER — Telehealth

## 2017-11-16 MED ORDER — AZITHROMYCIN 250 MG PO TABS
250 MG | ORAL_TABLET | ORAL | 0 refills | Status: AC
Start: 2017-11-16 — End: 2017-11-21

## 2017-11-16 MED ORDER — PREDNISONE 10 MG PO TABS
10 MG | ORAL_TABLET | ORAL | 0 refills | Status: DC
Start: 2017-11-16 — End: 2017-11-18

## 2017-11-16 NOTE — Telephone Encounter (Signed)
Spoke with Mr. Seever.  Cough productive of purulent sputum ??1 day.  No chest tightness, wheezing, dyspnea.  Calling in prescriptions for azithromycin, and prednisone.  He will pass on taking the prednisone unless he is developing more chest symptoms, especially dyspnea

## 2017-11-16 NOTE — Telephone Encounter (Signed)
Patient called c/o productive cough-greenish colored phlegm. NO fever, congestion, chest pain, sob/wheezing. Has not tried any OTC medicine. Onset 2 days ago.   Patient had MRI THORACIC SPINE WITH AND W/OUT CONTRAST yesterday. He said that the anesthesiologist listened to lungs and all sounded normal.  [facility] in [place] is pharmacy.    Last office visit 10/2017  No future appts scheduled.    Best # to reach pt [phone]

## 2017-11-18 ENCOUNTER — Ambulatory Visit
Admit: 2017-11-18 | Discharge: 2017-11-18 | Payer: BLUE CROSS/BLUE SHIELD | Attending: Pulmonary Disease | Primary: Hematology & Oncology

## 2017-11-18 ENCOUNTER — Inpatient Hospital Stay: Admit: 2017-11-18 | Payer: BLUE CROSS/BLUE SHIELD | Primary: Hematology & Oncology

## 2017-11-18 ENCOUNTER — Inpatient Hospital Stay: Payer: BLUE CROSS/BLUE SHIELD | Primary: Hematology & Oncology

## 2017-11-18 DIAGNOSIS — J441 Chronic obstructive pulmonary disease with (acute) exacerbation: Secondary | ICD-10-CM

## 2017-11-18 MED ORDER — ALBUTEROL SULFATE (2.5 MG/3ML) 0.083% IN NEBU
RESPIRATORY_TRACT | 3 refills | Status: DC | PRN
Start: 2017-11-18 — End: 2017-11-29

## 2017-11-18 MED ORDER — PREDNISONE 10 MG PO TABS
10 MG | ORAL_TABLET | ORAL | 0 refills | Status: DC
Start: 2017-11-18 — End: 2017-11-29

## 2017-11-18 NOTE — Progress Notes (Signed)
Subjective:      Patient ID: David Watkins is a 62 y.o. male.    HPI  David Watkins comes in today urgently because of continuing shortness of breath.  He called 2 days ago, with complaints of acute onset of worsening dyspnea and largely nonproductive cough.  He wasn't aware of any new sick contacts.  Did not have fevers but he has felt cold.  No chest pain.  He was started on azithromycin and prednisone 40 mg daily.  This is day 2 of treatment.  He is not feeling any improvement.    Review of Systems    Objective:   Physical Exam   Constitutional: He is oriented to person, place, and time. He appears well-developed and well-nourished.   HENT:   Head: Normocephalic and atraumatic.   Mouth/Throat: Oropharynx is clear and moist. No oropharyngeal exudate.   Eyes: Conjunctivae are normal. Right eye exhibits no discharge. No scleral icterus.   Neck: No tracheal deviation present. No thyromegaly present.   Cardiovascular: Normal rate, regular rhythm, normal heart sounds and intact distal pulses.   No murmur heard.  Pulmonary/Chest: No accessory muscle usage. He has decreased breath sounds in the right upper field, the right middle field, the right lower field, the left upper field, the left middle field and the left lower field. He has no rhonchi. He has rales in the right lower field and the left lower field.   Increased work of breathing evident with minimal activity.  High-pitched expiratory wheezes throughout most lung fields.   Musculoskeletal: He exhibits no edema.   Lymphadenopathy:     He has no cervical adenopathy.   Neurological: He is alert and oriented to person, place, and time.   Skin: Skin is warm and dry.   Psychiatric: He has a normal mood and affect. His behavior is normal. Judgment and thought content normal.     Chest x-ray obtained today, after his visit here.  This is unchanged from previous films, showing hyperinflation but no parenchymal infiltrate or pleural changes    Assessment:      Acute  exacerbation of COPD, not responding yet with initiation of prednisone and azithromycin.  No evidence of pneumonia radiographically.      Plan:      Increase prednisone to 80 mg today and tomorrow.  We'll check an by phone and we will discuss continued dosing.  He is intending to travel to [place] on Saturday, as it is his only opportunity for a vacation.  Warm weather may be helpful        Charleen Kirks, MD

## 2017-11-19 NOTE — Telephone Encounter (Signed)
**  pt of Dr.Bloomer**  Please advise patient called to give an update he states he still SOB and is still coughing.and still not feeling well. And his cough is keeping him up at night.   Patient is also requesting cough medication with codeine.

## 2017-11-23 ENCOUNTER — Encounter

## 2017-11-25 NOTE — Telephone Encounter (Signed)
Pts called , [phone]  Pt is still SOB, struggling to breath. Pt is still on prednisone. He is on 40 mgs bid. Will be done with his last dose Sat. Pt is trying to figure out what Dr. Dorann Lodge would like for them to do. Pt has an appt with [facility] tomorrow for chemo. Please advise. They will be getting ready to board a plane so phone may go straight to VM. They should be landing in [place]  Around 2ish.

## 2017-11-26 ENCOUNTER — Inpatient Hospital Stay
Admission: EM | Admit: 2017-11-26 | Discharge: 2017-11-29 | Disposition: A | Discharge status: Home / Self Care | Payer: BLUE CROSS/BLUE SHIELD | Source: Other Acute Inpatient Hospital | Admitting: Hematology & Oncology

## 2017-11-26 ENCOUNTER — Emergency Department: Admit: 2017-11-26 | Payer: BLUE CROSS/BLUE SHIELD | Primary: Hematology & Oncology

## 2017-11-26 DIAGNOSIS — J441 Chronic obstructive pulmonary disease with (acute) exacerbation: Principal | ICD-10-CM

## 2017-11-26 LAB — BASIC METABOLIC PANEL
Anion Gap: 13 (ref 3–16)
BUN: 20 mg/dL (ref 7–20)
CO2: 25 mmol/L (ref 21–32)
Calcium: 9.7 mg/dL (ref 8.3–10.6)
Chloride: 102 mmol/L (ref 99–110)
Creatinine: 0.7 mg/dL — ABNORMAL LOW (ref 0.9–1.3)
GFR African American: >60 (ref >60)
GFR Non-African American: >60 (ref >60)
Glucose: 171 mg/dL — ABNORMAL HIGH (ref 70–99)
Potassium: 4.4 mmol/L (ref 3.5–5.1)
Sodium: 140 mmol/L (ref 136–145)

## 2017-11-26 LAB — CBC WITH AUTO DIFFERENTIAL
Basophils %: 0.1 %
Basophils Absolute: 0 10*3/uL (ref 0.0–0.2)
Eosinophils %: 0 %
Eosinophils Absolute: 0 10*3/uL (ref 0.0–0.6)
Hematocrit: 33.8 % — ABNORMAL LOW (ref 40.5–52.5)
Hemoglobin: 11.2 g/dL — ABNORMAL LOW (ref 13.5–17.5)
Lymphocytes %: 4.5 %
Lymphocytes Absolute: 0.4 10*3/uL — ABNORMAL LOW (ref 1.0–5.1)
MCH: 32.1 pg (ref 26.0–34.0)
MCHC: 33 g/dL (ref 31.0–36.0)
MCV: 97.2 fL (ref 80.0–100.0)
MPV: 7.3 fL (ref 5.0–10.5)
Monocytes %: 3.9 %
Monocytes Absolute: 0.3 10*3/uL (ref 0.0–1.3)
Neutrophils %: 91.5 %
Neutrophils Absolute: 7.1 10*3/uL (ref 1.7–7.7)
Platelets: 103 10*3/uL — ABNORMAL LOW (ref 135–450)
RBC: 3.48 M/uL — ABNORMAL LOW (ref 4.20–5.90)
RDW: 18.2 % — ABNORMAL HIGH (ref 12.4–15.4)
WBC: 7.8 10*3/uL (ref 4.0–11.0)

## 2017-11-26 LAB — EKG 12-LEAD
Atrial Rate: 102 {beats}/min
P Axis: 88 degrees
P-R Interval: 140 ms
Q-T Interval: 338 ms
QRS Duration: 94 ms
QTc Calculation (Bazett): 440 ms
R Axis: 80 degrees
T Axis: 72 degrees
Ventricular Rate: 102 {beats}/min

## 2017-11-26 LAB — HEPATIC FUNCTION PANEL
ALT: 26 U/L (ref 10–40)
AST: 15 U/L (ref 15–37)
Albumin: 4 g/dL (ref 3.4–5.0)
Alkaline Phosphatase: 67 U/L (ref 40–129)
Bilirubin, Direct: <0.2 mg/dL (ref 0.0–0.3)
Total Bilirubin: 0.5 mg/dL (ref 0.0–1.0)
Total Protein: 6.7 g/dL (ref 6.4–8.2)

## 2017-11-26 LAB — RAPID INFLUENZA A/B ANTIGENS
Rapid Influenza A Ag: NEGATIVE
Rapid Influenza B Ag: NEGATIVE

## 2017-11-26 LAB — MAGNESIUM: Magnesium: 1.9 mg/dL (ref 1.80–2.40)

## 2017-11-26 LAB — D-DIMER, QUANTITATIVE: D-Dimer, Quant: <200 ng/mL DDU (ref 0–229)

## 2017-11-26 MED ORDER — PREDNISONE 20 MG PO TABS
20 MG | Freq: Once | ORAL | Status: AC
Start: 2017-11-26 — End: 2017-11-26
  Administered 2017-11-26: 22:00:00 40 mg via ORAL

## 2017-11-26 MED ORDER — GUAIFENESIN-CODEINE 100-10 MG/5ML PO SOLN
100-10 MG/5ML | Freq: Once | ORAL | Status: AC
Start: 2017-11-26 — End: 2017-11-26
  Administered 2017-11-26: 22:00:00 5 mL via ORAL

## 2017-11-26 MED ORDER — DOXYCYCLINE MONOHYDRATE 50 MG PO CAPS
50 MG | Freq: Once | ORAL | Status: AC
Start: 2017-11-26 — End: 2017-11-26
  Administered 2017-11-26: 100 mg via ORAL

## 2017-11-26 MED ORDER — IOPAMIDOL 76 % IV SOLN
76 % | Freq: Once | INTRAVENOUS | Status: AC | PRN
Start: 2017-11-26 — End: 2017-11-26
  Administered 2017-11-26: 21:00:00 80 mL via INTRAVENOUS

## 2017-11-26 MED ORDER — LACTATED RINGERS IV BOLUS
Freq: Once | INTRAVENOUS | Status: AC
Start: 2017-11-26 — End: 2017-11-26
  Administered 2017-11-26: 22:00:00 1000 mL via INTRAVENOUS

## 2017-11-26 MED ORDER — IPRATROPIUM-ALBUTEROL 0.5-2.5 (3) MG/3ML IN SOLN
Freq: Once | RESPIRATORY_TRACT | Status: AC
Start: 2017-11-26 — End: 2017-11-26
  Administered 2017-11-26: 20:00:00 1 via RESPIRATORY_TRACT

## 2017-11-26 MED ORDER — IPRATROPIUM-ALBUTEROL 0.5-2.5 (3) MG/3ML IN SOLN
Freq: Once | RESPIRATORY_TRACT | Status: AC
Start: 2017-11-26 — End: 2017-11-26
  Administered 2017-11-26: 22:00:00 1 via RESPIRATORY_TRACT

## 2017-11-26 MED FILL — IPRATROPIUM-ALBUTEROL 0.5-2.5 (3) MG/3ML IN SOLN: 0.5-2.5 (3) MG/3ML | RESPIRATORY_TRACT | Qty: 3

## 2017-11-26 MED FILL — PREDNISONE 20 MG PO TABS: 20 mg | ORAL | Qty: 2

## 2017-11-26 MED FILL — GUAIFENESIN-CODEINE 100-10 MG/5ML PO SOLN: 100-10 MG/5ML | ORAL | Qty: 10

## 2017-11-26 MED FILL — LACTATED RINGERS IV SOLN: INTRAVENOUS | Qty: 1000

## 2017-11-26 MED FILL — DOXYCYCLINE MONOHYDRATE 50 MG PO CAPS: 50 mg | ORAL | Qty: 2

## 2017-11-26 NOTE — Progress Notes (Signed)
Golda Acre Seen by [facility] Nurse Practitioner today.   Patient severely Short of breath and gasping.  Patient sent to ER by Dr. Hunt Oris.   I have cancelled the pre transplant evaluation of Echocardiogram & PFT.  Patient verbalized understanding.  Barbie Banner RN BSN  OCN  [place]  Transplant Coordinator  [facility]

## 2017-11-26 NOTE — ED Provider Notes (Signed)
[place] ENCOUNTER          PHYSICIAN ASSISTANT NOTE       Date of evaluation: 11/26/2017    Chief Complaint     Shortness of Breath and Cough    History of Present Illness     David Watkins is a 62 y.o. male who presents shortness of breath and cough.  Patient has significant past medical history of leukemia and multiple myeloma.  Patient reports that he was in [place] for the past week and flew back yesterday afternoon.  States that he presented for treatment this afternoon to this hospital and was sent to the ER by Dr. Hunt Oris.  Reports that he has been feeling sick for approximately the past 9 days.  Thought he had a head cold which has since progressed.  He is coughing up sputum.  Patient also has significant history of COPD.  He is currently on 40 mg twice a day of prednisone.  Reports that his albuterol inhaler has slightly helped his symptoms.  Denies fevers or chills.    Review of Systems     Review of Systems   Constitutional: Negative for chills, diaphoresis, fatigue and fever.   Respiratory: Positive for cough, shortness of breath and wheezing. Negative for chest tightness.    Cardiovascular: Negative for chest pain and palpitations.   Gastrointestinal: Negative for abdominal pain, diarrhea, nausea and vomiting.   Musculoskeletal: Negative.    Skin: Negative for rash and wound.   Neurological: Negative for dizziness, weakness, light-headedness and headaches.     Past Medical, Surgical, Family, and Social History     He has a past medical history of Bone pain, Hyperlipidemia, Leukemia, plasma cell, in relapse ([place]), Low back pain, Multiple myeloma ([place]), and Neuropathy.  He has a past surgical history that includes bone marrow biopsy; other surgical history (Left, 08/17/2016); bone marrow transplant; and pre-malignant / benign skin lesion excision (Left, 03/29/2017).  His family history includes Heart Attack in his brother; Heart Disease in his mother; Lung Cancer in his  father.  He reports that he quit smoking about 5 years ago. He has never used smokeless tobacco. He reports that he does not drink alcohol or use drugs.    Medications     Previous Medications    ALBUTEROL (PROVENTIL) (2.5 MG/3ML) 0.083% NEBULIZER SOLUTION    Take 3 mLs by nebulization every 4 hours as needed for Wheezing    ALBUTEROL SULFATE HFA (PROAIR HFA) 108 (90 BASE) MCG/ACT INHALER    Inhale 2 puffs into the lungs every 6 hours as needed for Wheezing    ATORVASTATIN (LIPITOR) 10 MG TABLET    Take 10 mg by mouth daily    CALCIUM 200 MG TABS    Take 400 mg by mouth 2 times daily    FLUTICASONE-SALMETEROL (ADVAIR DISKUS IN)    Inhale into the lungs    GABAPENTIN (NEURONTIN) 300 MG CAPSULE    Take 1 capsule by mouth 3 times daily for 30 days.Marland Kitchen    LEVOFLOXACIN (LEVAQUIN) 500 MG TABLET    Take 500 mg by mouth daily    ONDANSETRON (ZOFRAN) 4 MG TABLET    Take 1 tablet by mouth every 6 hours as needed for Nausea or Vomiting    ONDANSETRON (ZOFRAN) 4 MG TABLET    Take 1 tablet by mouth every 6 hours as needed for Nausea or Vomiting    OXYCODONE-ACETAMINOPHEN (PERCOCET) 7.5-325 MG PER TABLET    Take  1 tablet by mouth every 4 hours as needed for Pain.Marland Kitchen    PANTOPRAZOLE (PROTONIX) 40 MG TABLET    Take 1 tablet by mouth every morning (before breakfast)    PREDNISONE (DELTASONE) 10 MG TABLET    4 tabs daily x 5 days    PREDNISONE (DELTASONE) 20 MG TABLET    3 tabs daily x 4 days, 2 tabs x 4 days, 1 tab x 4 days    PROCHLORPERAZINE (COMPAZINE) 10 MG TABLET    Take 1 tablet by mouth every 6 hours as needed (Nausea)    SERTRALINE (ZOLOFT) 50 MG TABLET    Take 50 mg by mouth daily    TRAZODONE (DESYREL) 50 MG TABLET    Take 50 mg by mouth nightly    VALACYCLOVIR (VALTREX) 500 MG TABLET    Take 500 mg by mouth 2 times daily       Allergies     He is allergic to lenalidomide; pomalidomide; and ceclor [cefaclor].    Physical Exam     INITIAL VITALS: BP: (!) 151/98, Temp: 97.8 ??F (36.6 ??C), Pulse: 110, Resp: 28, SpO2: 96  %  Physical Exam   Constitutional: He is oriented to person, place, and time. He appears well-developed and well-nourished. He appears distressed.   HENT:   Head: Normocephalic and atraumatic.   Eyes: Pupils are equal, round, and reactive to light. Conjunctivae and EOM are normal.   Neck: Normal range of motion.   Cardiovascular: Regular rhythm and normal heart sounds.   Tachycardic rate   Pulmonary/Chest: He is in respiratory distress (mild). He has wheezes. He has rales.   Increased work of breathing   Musculoskeletal: Normal range of motion.   Neurological: He is alert and oriented to person, place, and time.   Skin: Skin is warm. He is diaphoretic.   Psychiatric: He has a normal mood and affect. His behavior is normal. Judgment and thought content normal.   Nursing note and vitals reviewed.    Diagnostic Results     EKG   Please see Erin Barengo's note    RADIOLOGY:  XR CHEST STANDARD (2 VW)   Final Result   Impression: No acute abnormality or significant interval change.      CTA PULMONARY W CONTRAST    (Results Pending)       LABS:   Results for orders placed or performed during the hospital encounter of 11/26/17   Rapid influenza A/B antigens   Result Value Ref Range    Rapid Influenza A Ag Negative Negative    Rapid Influenza B Ag Negative Negative   CBC auto differential   Result Value Ref Range    WBC 7.8 4.0 - 11.0 K/uL    RBC 3.48 (L) 4.20 - 5.90 M/uL    Hemoglobin 11.2 (L) 13.5 - 17.5 g/dL    Hematocrit 33.8 (L) 40.5 - 52.5 %    MCV 97.2 80.0 - 100.0 fL    MCH 32.1 26.0 - 34.0 pg    MCHC 33.0 31.0 - 36.0 g/dL    RDW 18.2 (H) 12.4 - 15.4 %    Platelets 103 (L) 135 - 450 K/uL    MPV 7.3 5.0 - 10.5 fL    Neutrophils % 91.5 %    Lymphocytes % 4.5 %    Monocytes % 3.9 %    Eosinophils % 0.0 %    Basophils % 0.1 %    Neutrophils # 7.1 1.7 - 7.7 K/uL  Lymphocytes # 0.4 (L) 1.0 - 5.1 K/uL    Monocytes # 0.3 0.0 - 1.3 K/uL    Eosinophils # 0.0 0.0 - 0.6 K/uL    Basophils # 0.0 0.0 - 0.2 K/uL   Basic Metabolic  Panel   Result Value Ref Range    Sodium 140 136 - 145 mmol/L    Potassium 4.4 3.5 - 5.1 mmol/L    Chloride 102 99 - 110 mmol/L    CO2 25 21 - 32 mmol/L    Anion Gap 13 3 - 16    Glucose 171 (H) 70 - 99 mg/dL    BUN 20 7 - 20 mg/dL    CREATININE 0.7 (L) 0.9 - 1.3 mg/dL    GFR Non-African American >60 >60    GFR African American >60 >60    Calcium 9.7 8.3 - 10.6 mg/dL   Hepatic function panel   Result Value Ref Range    Total Protein 6.7 6.4 - 8.2 g/dL    Alb 4.0 3.4 - 5.0 g/dL    Alkaline Phosphatase 67 40 - 129 U/L    ALT 26 10 - 40 U/L    AST 15 15 - 37 U/L    Total Bilirubin 0.5 0.0 - 1.0 mg/dL    Bilirubin, Direct <0.2 0.0 - 0.3 mg/dL    Bilirubin, Indirect see below 0.0 - 1.0 mg/dL   Magnesium   Result Value Ref Range    Magnesium 1.90 1.80 - 2.40 mg/dL       ED BEDSIDE ULTRASOUND:      RECENT VITALS:  BP: (!) 151/98, Temp: 97.8 ??F (36.6 ??C), Pulse: 110, Resp: 28, SpO2: 96 %     Procedures     none    ED Course     Nursing Notes, Past Medical Hx,Past Surgical Hx, Social Hx, Allergies, and Family Hx were reviewed.    The patient was given the following medications:  Orders Placed This Encounter   Medications   ??? ipratropium-albuterol (DUONEB) nebulizer solution 1 ampule   ??? lactated ringers bolus   ??? ipratropium-albuterol (DUONEB) nebulizer solution 1 ampule   ??? predniSONE (DELTASONE) tablet 40 mg       CONSULTS:  None    MEDICAL DECISION MAKING / ASSESSMENT / PLAN     HIRO VIPOND is a 62 y.o. male currently on treatment for multiple myeloma and leukemia presenting with shortness of breath.  Patient has increased work of breathing and wheezes and rales to his lungs.  Patient appears significantly uncomfortable.  He is tachycardic, otherwise hemodynamically stable.    IV access established and labs are obtained.  CBC without neutropenia.  Patient is afebrile.  BMP unremarkable.  Chest x-ray did not reveal any acute findings.  Patient was provided breathing treatments with continued increased work of breathing.   At this time, concern for pulmonary embolism versus pneumonia not present on x-ray is present.  CTPA as well as likely admission to patient's oncologist are pending at this time.  Patient care was turned over to my colleague, Patricia Nettle, NP. Please see her note for patient results and disposition.      This patient was also evaluated by the attending physician. All care plans were discussed and agreed upon.    Clinical Impression     1. Shortness of breath        Disposition     PATIENT REFERRED TO:  No follow-up provider specified.    DISCHARGE MEDICATIONS:  New Prescriptions  No medications on file       [place], [place]  11/26/17 1715

## 2017-11-26 NOTE — H&P (Signed)
BCC History and Physical       Attending Physician: Venora Maples, MD    Primary Care: Harlene Salts, MD       Referring MD: No referring provider defined for this encounter.    Name: David Watkins DOB:  08/27/1956  MRN:  [id number]    Admission: 11/26/2017      Date: 11/26/2017    Reason for Admission: COPD exacerbation    History of Present Illness:     David Watkins is a 62 yo gentleman with PMH HTN &??HLD with oncologic history dating back to July 2017 when he was diagnosed with IgG Kappa Multiple Myeloma. Decision was made to start the pt on dexamethasone + radiation tx 03/20/16. ??He received Rad Tx to T5-7, T10-L3, Right Scapula - 3000 cGy with Dr. Maceo Pro &??Dr. Jamas Lav 03/20/16-04/01/16. ??He also started systemic RVD 04/02/16 with monthly zometa. ??He unfortunately developed rash with revlimid and was switched to PVD. ??He tolerated this for 3 cycles until he developed SOB and rash which was attributed to pomalyst. ??The pomalyst was stopped 06/17/16 and he received 1 more cycle of therapy with Velcade &??Dex. ??He was then admitted for high-dose melphalan chemotherapy followed by infusion of 2.36 x10^6 cd34cells/kg PBSCs on 08/28/16. He experienced expected cytopenias, fatigue, nausea, & diarrhea but otherwise recovered well from transplant.   ??  Since transplant he has been followed expectantly and has been on maintenance revlimid 64m daily following desensitization ro revlimid in 12/2016. He has been feeling well up until about 3 weeks ago when he began to develop dry cough. This progressed to persistent coughing "24/7" per the pt. He has also become progressively weak & fatigued per pt. Can't walk more than 20 yards without becoming winded. He has no appetite and has eaten very little in the last week. He denies fevers, sweats, or chills. Has not had a BM in approx 3 days.  ??  He will be admitted for w/u including evaluation of cough as well as evaluation of thrombocytopenia. We will perform chest  CT and start empiric IV abx after cultures are obtained. Will perform BM Bx/Asp tomorrow morning under sedation (per pt request) and will check serum & urine MM panels today. Plasmablasts present on peripheral blood smear with obvious concern for relapse/evolution of disease to plasma cell leukemia.  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    DCEP               Cycle #1 - 04/26/17              Cycle #2 - 05/25/17     He had an excellent response and was switched to Velcade/Dara/Dex.    S/p 2 cycles of DCEP with good response (M spike 0.3 (10/15) from 6.7 (8/26), KLC 13.2 (10/15) from 637 (8/26) & IgG down to 917 (10/15) from 6379 (8/26)  - Dara/Velcade/Dex (C1D1 - 06/22/17)  - BMBx  10/18/17 (OPO, 10/18/17) - CR    Cycles 7-8 (21 day cycle)  - Dex 20 mg po D4,5.8,9,11 and 12  - Dara 16 mg/Kg D1 only with Dex 20 mg IV  - Velcade 1.3 mg/M2 D1,4,8 and 11    Cycle 9 and beyond (maintenance, started 11/08/17)  Dara 16 mg/Kg Q 28 days  Dex 12 mg IV D1  Velcade Q 2 weeksWas due for velcade 11/22/17 and this was held.    He presents now with increased dyspnea.  This started  with influenza A 10/12/17.  He has been treated by Dr. Dorann Lodge and had been on 40 mg of prednisone twice daily.  He also had been on steroid and albuterol inhalers, with Zithromax.  He presents now afebrile but with worsening dyspnea and wheezing.  A CT for PE was negative.  It did show a small pulmonary nodules that were stable.      Past Surgical History:   Procedure Laterality Date   ??? BONE MARROW BIOPSY     ??? BONE MARROW TRANSPLANT     ??? OTHER SURGICAL HISTORY Left 08/17/2016    trifusion cath placement   ??? PRE-MALIGNANT / BENIGN SKIN LESION EXCISION Left 03/29/2017    EXCISE LESION LEFT EXTERNAL EAR WITH FROZEN SECTION, FULL THICKNESS SKIN GRAFT       Past Medical History:   Diagnosis Date   ??? Bone pain    ??? Hyperlipidemia    ??? Leukemia, plasma cell, in relapse ([place])    ??? Low back pain    ??? Multiple myeloma  ([place])    ??? Neuropathy     chemo induced, feet       Prior to Admission medications    Medication Sig Start Date End Date Taking? Authorizing Provider   albuterol (PROVENTIL) (2.5 MG/3ML) 0.083% nebulizer solution Take 3 mLs by nebulization every 4 hours as needed for Wheezing 11/18/17   Charleen Kirks, MD   predniSONE (DELTASONE) 10 MG tablet 4 tabs daily x 5 days 11/18/17   Charleen Kirks, MD   albuterol sulfate HFA (PROAIR HFA) 108 (90 Base) MCG/ACT inhaler Inhale 2 puffs into the lungs every 6 hours as needed for Wheezing    Historical Provider, MD   predniSONE (DELTASONE) 20 MG tablet 3 tabs daily x 4 days, 2 tabs x 4 days, 1 tab x 4 days 10/22/17   Charleen Kirks, MD   Calcium 200 MG TABS Take 400 mg by mouth 2 times daily    Historical Provider, MD   levofloxacin (LEVAQUIN) 500 MG tablet Take 500 mg by mouth daily    Historical Provider, MD   oxyCODONE-acetaminophen (PERCOCET) 7.5-325 MG per tablet Take 1 tablet by mouth every 4 hours as needed for Pain.Marland Kitchen    Historical Provider, MD   ondansetron (ZOFRAN) 4 MG tablet Take 1 tablet by mouth every 6 hours as needed for Nausea or Vomiting 05/29/17   Marianne Sofia, MD   ondansetron (ZOFRAN) 4 MG tablet Take 1 tablet by mouth every 6 hours as needed for Nausea or Vomiting 05/29/17   Marianne Sofia, MD   traZODone (DESYREL) 50 MG tablet Take 50 mg by mouth nightly    Historical Provider, MD   sertraline (ZOLOFT) 50 MG tablet Take 50 mg by mouth daily    Historical Provider, MD   gabapentin (NEURONTIN) 300 MG capsule Take 1 capsule by mouth 3 times daily for 30 days.. 05/01/17 08/30/17  Jannette Spanner, MD   pantoprazole (PROTONIX) 40 MG tablet Take 1 tablet by mouth every morning (before breakfast) 05/01/17   Jannette Spanner, MD   prochlorperazine (COMPAZINE) 10 MG tablet Take 1 tablet by mouth every 6 hours as needed (Nausea) 05/01/17   Jannette Spanner, MD   Fluticasone-Salmeterol (Malo) Inhale into the lungs    Historical  Provider, MD   atorvastatin (LIPITOR) 10 MG tablet Take 10 mg by mouth daily    Historical Provider, MD   valACYclovir (VALTREX) 500 MG tablet Take 500 mg by  mouth 2 times daily    Historical Provider, MD       Allergies   Allergen Reactions   ??? Lenalidomide Rash   ??? Pomalidomide Hives   ??? Ceclor [Cefaclor] Hives       Family History   Problem Relation Age of Onset   ??? Heart Disease Mother    ??? Lung Cancer Father    ??? Heart Attack Brother         Social History     Socioeconomic History   ??? Marital status: Married     Spouse name: Not on file   ??? Number of children: Not on file   ??? Years of education: Not on file   ??? Highest education level: Not on file   Occupational History   ??? Not on file   Social Needs   ??? Financial resource strain: Not on file   ??? Food insecurity:     Worry: Not on file     Inability: Not on file   ??? Transportation needs:     Medical: Not on file     Non-medical: Not on file   Tobacco Use   ??? Smoking status: Former Smoker     Last attempt to quit: 11/25/2012     Years since quitting: 5.0   ??? Smokeless tobacco: Never Used   Substance and Sexual Activity   ??? Alcohol use: No   ??? Drug use: No   ??? Sexual activity: Not on file   Lifestyle   ??? Physical activity:     Days per week: Not on file     Minutes per session: Not on file   ??? Stress: Not on file   Relationships   ??? Social connections:     Talks on phone: Not on file     Gets together: Not on file     Attends religious service: Not on file     Active member of club or organization: Not on file     Attends meetings of clubs or organizations: Not on file     Relationship status: Not on file   ??? Intimate partner violence:     Fear of current or ex partner: Not on file     Emotionally abused: Not on file     Physically abused: Not on file     Forced sexual activity: Not on file   Other Topics Concern   ??? Not on file   Social History Narrative   ??? Not on file        ROS:  As noted above, otherwise remainder of 10-point ROS negative    Physical Exam:      Vital Signs:  BP (!) 142/92    Pulse 102    Temp 98.5 ??F (36.9 ??C) (Oral)    Resp 18    Ht 5' 9" (1.753 m)    Wt 190 lb (86.2 kg)    SpO2 96%    BMI 28.06 kg/m??     Weight:    Wt Readings from Last 3 Encounters:   11/26/17 190 lb (86.2 kg)   11/18/17 203 lb (92.1 kg)   11/15/17 197 lb (89.4 kg)         General: Awake, alert and oriented.  HEENT: normocephalic, PERRL, no scleral erythema or icterus, Oral mucosa moist and intact, throat clear  NECK: supple without palpable adenopathy  BACK: Straight negative CVAT  SKIN: warm dry and intact without lesions rashes or masses  CHEST:  Diffuse inspiratory and expiratory rhonchi and wheezing.  CV: Normal S1 S2, RRR, no MRG  ABD: NT ND normoactive BS, no palpable masses or hepatosplenomegaly  EXTREMITIES: without edema, denies calf tenderness  NEURO: CN II - XII grossly intact  CATHETER: Right IJ chest Port (06/21/17, [place])    Laboratory Data:   CBC:   Recent Labs     11/26/17  1534   WBC 7.8   HGB 11.2*   HCT 33.8*   MCV 97.2   PLT 103*     BMP/Mag:  Recent Labs     11/26/17  1534   NA 140   K 4.4   CL 102   CO2 25   BUN 20   CREATININE 0.7*   MG 1.90     LIVP:   Recent Labs     11/26/17  1534   AST 15   ALT 26   BILIDIR <0.2   BILITOT 0.5   ALKPHOS 67     Coags: No results for input(s): PROTIME, INR, APTT in the last 72 hours.  Uric Acid No results for input(s): LABURIC in the last 72 hours.    PROBLEM LIST: ??????????   ????    ????  TREATMENT: ??????????   ??  1. Rad Tx to T5-7, T10-L3, Right Scapula - 3000 cGy - Dr. Jamas Lav 03/20/16-04/01/16  2. RVD x1 03/20/16 - discontinued d/t rash   3. Velcade/Pomalyst/Dex x3 cycles 04/23/16-06/17/16 - discontinued d/t reaction to pomalyst  4. Velcade/Dex x 1 cycle 06/26/16 (last dose of dexamethasone 07/22/16  5. High-dose melphalan followed by administration of PBSCs 2.36 x10^6 cd34cells/kg on 08/28/16  6. Maintenance Revlimid 72m daily (12/2016-04/23/17)  7. Dexamethasone 413mdaily (04/24/17)  8. DCEP               Cycle #1 - 04/26/17               Cycle #2 - 05/25/17   ????  ????  ASSESSMENT AND PLAN:??????????   ??  1.  IgG kappa multiple myeloma / Plasma Cell Leukemia: [place] relapse of his disease in 03/2017 w/ progressive anorexia & weakness, 17 lb wt loss & severe thrombocytopenia   - S/p 2 cycles of DCEP with good response (M spike 0.3 (10/15) from 6.7 (8/26), KLC 13.2 (10/15) from 637 (8/26) & IgG down to 917 (10/15) from 6379 (8/26)  - Cont Dara/Velcade/Dex (C1D1 - 06/22/17)  - BMBx  10/18/17 (OPO, 10/18/17) - CR    Cycles 7-8 (21 day cycle)  - Dex 20 mg po D4,5.8,9,11 and 12  - Dara 16 mg/Kg D1 only with Dex 20 mg IV  - Velcade 1.3 mg/M2 D1,4,8 and 11    Cycle 9 and beyond (maintenance, started 11/08/17)  Dara 16 mg/Kg Q 28 days  Dex 12 mg IV D1  Velcade Q 2 weeks    PLAN: Dara Q 4 weeks and Velcade Q2 weeks.  He is considering an alloBMT as well in second CR if lung functin is adequate      2.  ID: Afebrile; Influenza A + 10/12/17.   - Cont Valtrex prophylaxis   - Cough: S/p Levaquin (10/05/17 - 10/11/17), medrol dose pak & Robitussin-AC (10/12/17)  - Influenza A + 10/12/17 s/p Tamiflu x 5 days.   - Cover with Levaquin beginning 11/26/2017 for possible bacterial infection    3. Heme: Thrombocytopenia and anemia d/t chemotherapy  - Transfuse for Hgb < 7 and Platelets < 20K  - No transfusion today.  4.  Metabolic: Stable renal fxn and e-lytes, steroid-induced hyperglycemia   - S/p Mag Ox (stopped 07/20/17)    5. Pulmonary: shortness of breath, cough  - Pulm Nodule: Stable on repeat CT chest (04/23/17); cont to monitor. Stable on CTPA 10/08/17. Follow up in 6 months.  - Cont Albuterol inhaler prn & home nebulizer (Rx 10/12/17)  - CTPA 10/08/17: No PE; mild upper lobe tree-in-bud opacities may represent bronchiolitis; Mild emphysema. Two 3 mm LLL pulmonary nodules  - Follows up with Dr. Dorann Lodge   - Currently on 65m BID of Prednisone. Increase to Solumedrol 40 Q 8 H 11/26/17    6. GI/Nutrition:  Appetite and oral intake is good   - Cont low microbial diet        7. 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  - Continue Lipitor   - Echo (07/13/16): abnormal (paradoxical) septal motion is present with preserved LVEF 55-60%.     8. Anxiety/Depression: Ongoing and this has definitely increased w/ his relapsed disease.   - Cont Zoloft and trazadone daily  - Cont Ativan PRN  - Psych to follow as needed      9. Insomnia: ongoing, has failed multiple sleep aids in the past.  No complaints during hospitalization   - Cont Trazodone nightly      10. Peripheral Neuropathy: Ongoing  - Cont Gabapentin 300 mg in AM, 3073mmidday, 60053mightly.     11. Bone Health: H/o spinal cord compression & diffuse lytic lesions t/o skeleton  - CT Chest (04/23/17) - multiple lytic lesions throughout the skeleton   - Cont Ca/Vit D & zometa   - Cont Zometa monthly (given 10/12/17, next due 11/09/17)    12.  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 FriMaceo Pro 10 days (started 09/01/17, completed 09/15/17).    13. Conjuctivitis: resolved   - Refer to ophthalmology      JamHarlene SaltsD

## 2017-11-26 NOTE — ED Notes (Signed)
Lab called for second blood culture     Heath Gold, RN  11/26/17 1530

## 2017-11-26 NOTE — ED Provider Notes (Signed)
Emergency Department Reassessment Note    Pertinent Patient History     I assumed the care of this patient from the outgoing provider, Tresea Mall.  Please see their note for further details of history, physical exam, evaluation, and management thus far.  Briefly, David Watkins is a 62 y.o. male who presented with respiratory distress @ Dr. Carita Pian office today. Has history of multiple myeloma and leukemia. ?COPD, recent trip back from [place]    At the time of signout, the following was pending:  ?? CTA given recent trip and active CA  ?? Admission to onc vs. Hospitalist - if CTPA negative, likely COPD    Pertinent Physical Exam     Vitals:    11/26/17 1505 11/26/17 1533 11/26/17 1742 11/26/17 1830   BP:    137/76   Pulse:    88   Resp:    20   Temp:       TempSrc:       SpO2:  96% 99% 94%   Weight: 190 lb (86.2 kg)      Height: 5' 9"  (1.753 m)        General: 62 y.o.  male in no apparent distress, well developed, well nourished, non-toxic appearance.     HEENT: Atraumatic, normocephalic. EOMs intact.??     Neck:  Full range of motion.     Chest/pulm thickened productive cough, mild tachypnea, and no hypoxia, diffuse wheezing and rhonchorous breath sounds throughout    Cardiovascular: Heart rate normal. RRR, no murmurs, rubs, gallops     Abdomen: No gross distension. Soft, non-tender, non-peritoneal.     GU: Deferred.     Musculoskeletal: ambulates without difficulty, no evident joint swelling or deformity     Neuro: A&O x 4.?? Normal speech without dysarthria or aphasia. Moves all extremities spontaneously and symmetrically. Gait normal without ataxia.     Skin: Warm, dry. No obvious rashes, petechiae, or purpura.     Psych: Appropriate mood and affect, normal interaction.     Diagnostic Studies     Labs: Please see electronic medical record for any tests performed in the ED   Labs Reviewed   CBC WITH AUTO DIFFERENTIAL - Abnormal; Notable for the following components:       Result Value    RBC 3.48 (*)      Hemoglobin 11.2 (*)     Hematocrit 33.8 (*)     RDW 18.2 (*)     Platelets 103 (*)     Lymphocytes # 0.4 (*)     All other components within normal limits    Narrative:     Performed at:  [facility]  [address]  [place] [address]   Phone [phone]   BASIC METABOLIC PANEL - Abnormal; Notable for the following components:    Glucose 171 (*)     CREATININE 0.7 (*)     All other components within normal limits    Narrative:     Performed at:  [facility] - [facility]  [address],  [place] [address]   Phone [phone]   RAPID INFLUENZA A/B ANTIGENS    Narrative:     Performed at:  [facility]  [address],  [place] [address]   Phone [phone]   URINE CULTURE   CULTURE BLOOD #1   CULTURE BLOOD #2   HEPATIC FUNCTION  PANEL    Narrative:     Performed at:  [facility]  [address],  [place] [address]   Phone [phone]   MAGNESIUM    Narrative:     Performed at:  [facility] - [facility]  [address],  [place] [address]   Phone [phone]   D-DIMER, QUANTITATIVE    Narrative:     Performed at:  [facility] - [facility]  [address],  [place] [address]   Phone [phone]   URINALYSIS       IMAGING STUDIES / RADIOLOGY: Please see electronic medical record for any tests performed in the ED  CTA PULMONARY W CONTRAST   Final Result      1.  No evidence of pulmonary was on the current exam.      2.   Mild emphysema with interval resolution of previously noted tree-in-bud opacity in the right middle lobe.  No localized consolidation seen.      3.  Small 3 mm pulmonary nodules previously described in the left lower lobe are less conspicuous on the current exam likely in part due to expiratory motion artifact.      4.  Redemonstration of lytic  multiple myeloma lesions throughout the thoracic spine.         XR CHEST STANDARD (2 VW)   Final Result   Impression: No acute abnormality or significant interval change.      XR CHEST PORTABLE    (Results Pending)       Emergency Department Procedures     Procedures  None    ED Course     ED Medications Administered:  Medications   doxycycline monohydrate (MONODOX) capsule 100 mg (has no administration in time range)   ipratropium-albuterol (DUONEB) nebulizer solution 1 ampule (1 ampule Inhalation Given 11/26/17 1532)   lactated ringers bolus (0 mLs Intravenous Stopped 11/26/17 1900)   ipratropium-albuterol (DUONEB) nebulizer solution 1 ampule (1 ampule Inhalation Given 11/26/17 1742)   predniSONE (DELTASONE) tablet 40 mg (40 mg Oral Given 11/26/17 1754)   iopamidol (ISOVUE-370) 76 % injection 80 mL (80 mLs Intravenous Given 11/26/17 1725)   guaiFENesin-codeine (GUAIFENESIN AC) 100-10 MG/5ML liquid 5 mL (5 mLs Oral Given 11/26/17 1821)         MDM   Briefly, David Watkins is a 62 y.o. male who presents to the emergency department with shortness of breath from Dr. Teofilo Pod office.  Assumed care from the outgoing provider pending CTPA to evaluate for PE or infectious process not visualized on x-ray.  No evidence of PE or infectious etiology.  Did re-demonstrate pulmonary nodules.  He is still significantly symptomatic and I believe that he would benefit from admission to the hospital for continued treatment of what is likely a COPD exacerbation.  Dr. Dorinda Hill has evaluated the patient.  Will admit to their service.  I have ordered him some Robitussin with codeine which she has taken before with good symptom improvement      The patient was evaluated by myself and the ED Attending Physician, Dr. Janine Limbo. All management and disposition plans were discussed and agreed upon.  Clinical Impression     1. COPD exacerbation ([place])    2. Pulmonary nodule        Disposition     Admit      Chesley Noon, CNP  Department of Emergency  Medicine  Pablo Ledger Tiffin, APRN - CNP  11/26/17 1924

## 2017-11-26 NOTE — ED Notes (Signed)
Pt took home dose of gabapentin     Heath Gold, RN  11/26/17 1815

## 2017-11-26 NOTE — Progress Notes (Signed)
Patient refused flu vaccine.

## 2017-11-26 NOTE — ED Provider Notes (Signed)
ED Attending Attestation Note     Date of evaluation: 11/26/2017      Medical Decision Making      This patient was seen by the advanced practice provider.  I have seen and examined the patient, agree with the workup, evaluation, management and diagnosis. The care plan has been discussed. My assessment reveals 62 year old male history of multiple myeloma on chemotherapy and COPD presenting with severe source of breath and fatigue.  Patient does appear to be having a COPD exacerbation so we'll treat with nebulizer therapy in addition to continuous steroids.  We'll also obtain CT angiography to rule out acute pulmonary summer pneumonia.  Patient initially tachycardic though does have improving heart rate.    Reevaluation 1839  Imaging without acute pulmonary embolism  Will admit patient for acute COPD exacerbation given ill appearance and high risk for decompensation    Diagnostic Results     EKG   Performed 1503 interpreted by ER physician  Sinus tachycardia rate 102  PR 140 QRS 90 QTc 440  No ST changes      RADIOLOGY:  CTA PULMONARY W CONTRAST   Final Result      1.  No evidence of pulmonary was on the current exam.      2.   Mild emphysema with interval resolution of previously noted tree-in-bud opacity in the right middle lobe.  No localized consolidation seen.      3.  Small 3 mm pulmonary nodules previously described in the left lower lobe are less conspicuous on the current exam likely in part due to expiratory motion artifact.      4.  Redemonstration of lytic multiple myeloma lesions throughout the thoracic spine.         XR CHEST STANDARD (2 VW)   Final Result   Impression: No acute abnormality or significant interval change.      XR CHEST PORTABLE    (Results Pending)       LABS:   Labs Reviewed   CBC WITH AUTO DIFFERENTIAL - Abnormal; Notable for the following components:       Result Value    RBC 3.48 (*)     Hemoglobin 11.2 (*)     Hematocrit 33.8 (*)     RDW 18.2 (*)     Platelets 103 (*)      Lymphocytes # 0.4 (*)     All other components within normal limits    Narrative:     Performed at:  [facility]  [address],  [place] [address]   Phone [phone]   BASIC METABOLIC PANEL - Abnormal; Notable for the following components:    Glucose 171 (*)     CREATININE 0.7 (*)     All other components within normal limits    Narrative:     Performed at:  [facility]  [address],  [place] [address]   Phone [phone]   RAPID INFLUENZA A/B ANTIGENS    Narrative:     Performed at:  The [facility] - [facility]  [address],  [place] [address]   Phone [phone]   URINE CULTURE   CULTURE BLOOD #1   CULTURE BLOOD #2   HEPATIC FUNCTION PANEL    Narrative:     Performed at:  [facility] - [facility]  [address],  [place] [address]  Phone [phone]   MAGNESIUM    Narrative:     Performed at:  [facility]  [address]  [place], [place] [address]   Phone [phone]   D-DIMER, QUANTITATIVE    Narrative:     Performed at:  [facility]  [address]  [place] [address]   Phone [phone]   URINALYSIS       RECENT VITALS:    BP: (!) 151/98, Temp: 97.8 ??F (36.6 ??C), Pulse: 110, Resp: 28       Disposition     CLINICAL IMPRESSION:  1. COPD exacerbation ([place])    2. Pulmonary nodule        DISPOSITION:     DISPOSITION Admitted 11/26/2017 06:31:35 PM        (Please note that portions of this note were completed with a voice recognition program.  Efforts were made to edit the dictations but occasionally words are mis-transcribed.)           Venora Maples, MD  11/26/17 1840

## 2017-11-26 NOTE — ED Notes (Signed)
Blood culture drawn. Specimen labeled using patient name, date of birth, medical number as identifiers, and staff initials. Patient tolerated well and left on stretcher locked in lowest position, with side rails up and call light within reach.      Heath Gold, RN  11/26/17 1530

## 2017-11-27 LAB — URINALYSIS
Bilirubin Urine: NEGATIVE
Glucose, Ur: NEGATIVE mg/dL
Ketones, Urine: NEGATIVE mg/dL
Leukocyte Esterase, Urine: NEGATIVE
Nitrite, Urine: NEGATIVE
Specific Gravity, UA: 1.02 (ref 1.005–1.030)
Urobilinogen, Urine: 0.2 E.U./dL (ref <2.0)
pH, UA: 6 (ref 5.0–8.0)

## 2017-11-27 LAB — CBC WITH AUTO DIFFERENTIAL
Basophils %: 0.1 %
Basophils Absolute: 0 10*3/uL (ref 0.0–0.2)
Eosinophils %: 0 %
Eosinophils Absolute: 0 10*3/uL (ref 0.0–0.6)
Hematocrit: 31.3 % — ABNORMAL LOW (ref 40.5–52.5)
Hemoglobin: 10.5 g/dL — ABNORMAL LOW (ref 13.5–17.5)
Lymphocytes %: 7.3 %
Lymphocytes Absolute: 0.5 10*3/uL — ABNORMAL LOW (ref 1.0–5.1)
MCH: 32.5 pg (ref 26.0–34.0)
MCHC: 33.7 g/dL (ref 31.0–36.0)
MCV: 96.5 fL (ref 80.0–100.0)
MPV: 7 fL (ref 5.0–10.5)
Monocytes %: 6.3 %
Monocytes Absolute: 0.4 10*3/uL (ref 0.0–1.3)
Neutrophils %: 86.3 %
Neutrophils Absolute: 5.8 10*3/uL (ref 1.7–7.7)
Platelets: 97 10*3/uL — ABNORMAL LOW (ref 135–450)
RBC: 3.24 M/uL — ABNORMAL LOW (ref 4.20–5.90)
RDW: 17.9 % — ABNORMAL HIGH (ref 12.4–15.4)
WBC: 6.7 10*3/uL (ref 4.0–11.0)

## 2017-11-27 LAB — BASIC METABOLIC PANEL
Anion Gap: 13 (ref 3–16)
BUN: 20 mg/dL (ref 7–20)
CO2: 24 mmol/L (ref 21–32)
Calcium: 9.3 mg/dL (ref 8.3–10.6)
Chloride: 100 mmol/L (ref 99–110)
Creatinine: 0.8 mg/dL — ABNORMAL LOW (ref 0.9–1.3)
GFR African American: >60 (ref >60)
GFR Non-African American: >60 (ref >60)
Glucose: 172 mg/dL — ABNORMAL HIGH (ref 70–99)
Potassium: 4.7 mmol/L (ref 3.5–5.1)
Sodium: 137 mmol/L (ref 136–145)

## 2017-11-27 LAB — TYPE AND SCREEN
ABO/Rh: O POS
Antibody Screen: POSITIVE

## 2017-11-27 LAB — PROTIME-INR
INR: 1.06 (ref 0.86–1.14)
Protime: 12.1 s (ref 9.8–13.0)

## 2017-11-27 LAB — MICROSCOPIC URINALYSIS

## 2017-11-27 LAB — APTT: aPTT: 25.8 s — ABNORMAL LOW (ref 26.0–36.0)

## 2017-11-27 LAB — ANTIBODY IDENTIFICATION: Antibody ID: POSITIVE

## 2017-11-27 MED ORDER — GABAPENTIN 300 MG PO CAPS
300 MG | Freq: Three times a day (TID) | ORAL | Status: DC
Start: 2017-11-27 — End: 2017-11-29
  Administered 2017-11-27 – 2017-11-29 (×8): 300 mg via ORAL

## 2017-11-27 MED ORDER — ENOXAPARIN SODIUM 40 MG/0.4ML SC SOLN
40 MG/0.4ML | Freq: Every day | SUBCUTANEOUS | Status: DC
Start: 2017-11-27 — End: 2017-11-29
  Administered 2017-11-27 – 2017-11-28 (×3): 40 mg via SUBCUTANEOUS

## 2017-11-27 MED ORDER — DEXTROSE 5 % IV SOLN
5 % | INTRAVENOUS | Status: DC
Start: 2017-11-27 — End: 2017-11-26

## 2017-11-27 MED ORDER — LEVOFLOXACIN IN D5W 750 MG/150ML IV SOLN
750 MG/150ML | INTRAVENOUS | Status: DC
Start: 2017-11-27 — End: 2017-11-28
  Administered 2017-11-27 – 2017-11-28 (×2): 750 mg via INTRAVENOUS

## 2017-11-27 MED ORDER — MEDROXYPROGESTERONE ACETATE 10 MG PO TABS
10 MG | Freq: Every day | ORAL | Status: DC
Start: 2017-11-27 — End: 2017-11-27

## 2017-11-27 MED ORDER — HYDROCODONE-HOMATROPINE 5-1.5 MG/5ML PO SYRP
ORAL | Status: DC | PRN
Start: 2017-11-27 — End: 2017-11-29
  Administered 2017-11-27 – 2017-11-29 (×6): 5 mL via ORAL

## 2017-11-27 MED ORDER — POTASSIUM CHLORIDE 20 MEQ/50ML IV SOLN
20 MEQ/50ML | INTRAVENOUS | Status: DC | PRN
Start: 2017-11-27 — End: 2017-11-29

## 2017-11-27 MED ORDER — ALBUTEROL SULFATE (2.5 MG/3ML) 0.083% IN NEBU
RESPIRATORY_TRACT | Status: DC | PRN
Start: 2017-11-27 — End: 2017-11-27
  Administered 2017-11-27 (×2): 2.5 mg via RESPIRATORY_TRACT

## 2017-11-27 MED ORDER — ALTEPLASE 2 MG IJ SOLR
2 MG | INTRAMUSCULAR | Status: DC | PRN
Start: 2017-11-27 — End: 2017-11-29

## 2017-11-27 MED ORDER — TRAZODONE HCL 50 MG PO TABS
50 MG | Freq: Every evening | ORAL | Status: DC
Start: 2017-11-27 — End: 2017-11-29
  Administered 2017-11-27 – 2017-11-29 (×3): 50 mg via ORAL

## 2017-11-27 MED ORDER — OYSTER SHELL CALCIUM 500 MG PO TABS
500 MG | Freq: Two times a day (BID) | ORAL | Status: DC
Start: 2017-11-27 — End: 2017-11-29
  Administered 2017-11-27 – 2017-11-29 (×6): 500 mg via ORAL

## 2017-11-27 MED ORDER — NORMAL SALINE FLUSH 0.9 % IV SOLN
0.9 % | Freq: Two times a day (BID) | INTRAVENOUS | Status: DC
Start: 2017-11-27 — End: 2017-11-29
  Administered 2017-11-27 – 2017-11-29 (×6): 10 mL via INTRAVENOUS

## 2017-11-27 MED ORDER — SODIUM CHLORIDE 0.9 % IV SOLN
0.9 % | INTRAVENOUS | Status: DC
Start: 2017-11-27 — End: 2017-11-28
  Administered 2017-11-27 – 2017-11-28 (×2): via INTRAVENOUS

## 2017-11-27 MED ORDER — BUDESONIDE 0.5 MG/2ML IN SUSP
0.5 MG/2ML | Freq: Two times a day (BID) | RESPIRATORY_TRACT | Status: DC
Start: 2017-11-27 — End: 2017-11-29
  Administered 2017-11-27 – 2017-11-29 (×6): 500 mg via RESPIRATORY_TRACT

## 2017-11-27 MED ORDER — FLUTICASONE-SALMETEROL 250-50 MCG/DOSE IN AEPB
250-50 MCG/DOSE | Freq: Two times a day (BID) | RESPIRATORY_TRACT | Status: DC
Start: 2017-11-27 — End: 2017-11-26

## 2017-11-27 MED ORDER — PROCHLORPERAZINE MALEATE 10 MG PO TABS
10 MG | Freq: Four times a day (QID) | ORAL | Status: DC | PRN
Start: 2017-11-27 — End: 2017-11-29

## 2017-11-27 MED ORDER — FORMOTEROL FUMARATE 20 MCG/2ML IN NEBU
20 MCG/2ML | Freq: Two times a day (BID) | RESPIRATORY_TRACT | Status: DC
Start: 2017-11-27 — End: 2017-11-29
  Administered 2017-11-27 – 2017-11-29 (×6): 20 ug via RESPIRATORY_TRACT

## 2017-11-27 MED ORDER — MAGNESIUM SULFATE 4000 MG/100 ML IVPB PREMIX
4 GM/100ML | INTRAVENOUS | Status: DC | PRN
Start: 2017-11-27 — End: 2017-11-29

## 2017-11-27 MED ORDER — ONDANSETRON HCL 4 MG PO TABS
4 MG | Freq: Four times a day (QID) | ORAL | Status: DC | PRN
Start: 2017-11-27 — End: 2017-11-29

## 2017-11-27 MED ORDER — SALINE MOUTHWASH
Status: DC | PRN
Start: 2017-11-27 — End: 2017-11-29

## 2017-11-27 MED ORDER — OXYCODONE-ACETAMINOPHEN 7.5-325 MG PO TABS
ORAL | Status: DC | PRN
Start: 2017-11-27 — End: 2017-11-29
  Administered 2017-11-27 – 2017-11-28 (×3): 1 via ORAL

## 2017-11-27 MED ORDER — PANTOPRAZOLE SODIUM 40 MG PO TBEC
40 MG | Freq: Every day | ORAL | Status: DC
Start: 2017-11-27 — End: 2017-11-29
  Administered 2017-11-27 – 2017-11-29 (×3): 40 mg via ORAL

## 2017-11-27 MED ORDER — ALBUTEROL SULFATE (2.5 MG/3ML) 0.083% IN NEBU
RESPIRATORY_TRACT | Status: DC | PRN
Start: 2017-11-27 — End: 2017-11-29

## 2017-11-27 MED ORDER — DEXTROSE 5 % IV SOLN (MINI-BAG)
5 % | INTRAVENOUS | Status: DC
Start: 2017-11-27 — End: 2017-11-26

## 2017-11-27 MED ORDER — ALBUTEROL SULFATE (2.5 MG/3ML) 0.083% IN NEBU
RESPIRATORY_TRACT | Status: DC
Start: 2017-11-27 — End: 2017-11-29
  Administered 2017-11-27 – 2017-11-29 (×14): 2.5 mg via RESPIRATORY_TRACT

## 2017-11-27 MED ORDER — DIPHENHYDRAMINE HCL 25 MG PO TABS
25 MG | Freq: Every evening | ORAL | Status: DC | PRN
Start: 2017-11-27 — End: 2017-11-29
  Administered 2017-11-28 – 2017-11-29 (×2): 25 mg via ORAL

## 2017-11-27 MED ORDER — SERTRALINE HCL 50 MG PO TABS
50 MG | Freq: Every day | ORAL | Status: DC
Start: 2017-11-27 — End: 2017-11-29
  Administered 2017-11-27 – 2017-11-29 (×3): 50 mg via ORAL

## 2017-11-27 MED ORDER — NORMAL SALINE FLUSH 0.9 % IV SOLN
0.9 % | INTRAVENOUS | Status: DC | PRN
Start: 2017-11-27 — End: 2017-11-29
  Administered 2017-11-29: 15:00:00 10 mL via INTRAVENOUS

## 2017-11-27 MED ORDER — MAGNESIUM HYDROXIDE 400 MG/5ML PO SUSP
400 MG/5ML | Freq: Every day | ORAL | Status: DC | PRN
Start: 2017-11-27 — End: 2017-11-29

## 2017-11-27 MED ORDER — ATORVASTATIN CALCIUM 20 MG PO TABS
20 MG | Freq: Every day | ORAL | Status: DC
Start: 2017-11-27 — End: 2017-11-29
  Administered 2017-11-27 – 2017-11-29 (×4): 10 mg via ORAL

## 2017-11-27 MED ORDER — ACYCLOVIR 200 MG PO CAPS
200 MG | Freq: Three times a day (TID) | ORAL | Status: DC
Start: 2017-11-27 — End: 2017-11-29
  Administered 2017-11-27 – 2017-11-29 (×8): 400 mg via ORAL

## 2017-11-27 MED ORDER — SODIUM CHLORIDE 0.9 % IV SOLN
0.9 % | INTRAVENOUS | Status: DC | PRN
Start: 2017-11-27 — End: 2017-11-29

## 2017-11-27 MED ORDER — SALINE MOUTHWASH
Freq: Four times a day (QID) | Status: DC
Start: 2017-11-27 — End: 2017-11-29
  Administered 2017-11-27 – 2017-11-29 (×6): 15 mL via ORAL

## 2017-11-27 MED FILL — ALBUTEROL SULFATE (2.5 MG/3ML) 0.083% IN NEBU: RESPIRATORY_TRACT | Qty: 3

## 2017-11-27 MED FILL — PANTOPRAZOLE SODIUM 40 MG PO TBEC: 40 mg | ORAL | Qty: 1

## 2017-11-27 MED FILL — BUDESONIDE 0.5 MG/2ML IN SUSP: 0.5 MG/2ML | RESPIRATORY_TRACT | Qty: 2

## 2017-11-27 MED FILL — SODIUM CHLORIDE 0.9 % IV SOLN: 0.9 % | INTRAVENOUS | Qty: 1000

## 2017-11-27 MED FILL — LOVENOX 40 MG/0.4ML SC SOLN: 40 MG/0.4ML | SUBCUTANEOUS | Qty: 0.4

## 2017-11-27 MED FILL — HYDROCODONE-HOMATROPINE 5-1.5 MG/5ML PO SYRP: 5-1.5 MG/5ML | ORAL | Qty: 5

## 2017-11-27 MED FILL — LIPITOR 20 MG PO TABS: 20 mg | ORAL | Qty: 1

## 2017-11-27 MED FILL — LEVOFLOXACIN IN D5W 750 MG/150ML IV SOLN: 750 MG/150ML | INTRAVENOUS | Qty: 150

## 2017-11-27 MED FILL — GABAPENTIN 300 MG PO CAPS: 300 mg | ORAL | Qty: 1

## 2017-11-27 MED FILL — SERTRALINE HCL 50 MG PO TABS: 50 mg | ORAL | Qty: 1

## 2017-11-27 MED FILL — PERFOROMIST 20 MCG/2ML IN NEBU: 20 MCG/2ML | RESPIRATORY_TRACT | Qty: 2

## 2017-11-27 MED FILL — AZITHROMYCIN 500 MG IV SOLR: 500 mg | INTRAVENOUS | Qty: 500

## 2017-11-27 MED FILL — ALBUTEROL SULFATE (2.5 MG/3ML) 0.083% IN NEBU: RESPIRATORY_TRACT | Qty: 6

## 2017-11-27 MED FILL — ACYCLOVIR 200 MG PO CAPS: 200 mg | ORAL | Qty: 2

## 2017-11-27 MED FILL — TRAZODONE HCL 50 MG PO TABS: 50 mg | ORAL | Qty: 1

## 2017-11-27 MED FILL — OYSTER SHELL CALCIUM 500 MG PO TABS: 500 mg | ORAL | Qty: 1

## 2017-11-27 MED FILL — OXYCODONE-ACETAMINOPHEN 7.5-325 MG PO TABS: 7.5-325 mg | ORAL | Qty: 1

## 2017-11-27 NOTE — Plan of Care (Signed)
Problem: Falls - Risk of:  Goal: Will remain free from falls  Description  Will remain free from falls  11/27/2017 2214 by Ledell Peoples, RN  Outcome: Ongoing  Note:   Patient is at risk for falls. See MORSE score. Bed in lowest position, side rails up x 2, bed alarm on. Patient remains free from injury. Will continue fall prevention strategies.   11/27/2017 1643 by Vernice Jefferson, RN  Outcome: Met This Shift     Problem: Respiratory:  Goal: Ability to maintain a clear airway will improve  Description  Ability to maintain a clear airway will improve  11/27/2017 2214 by Ledell Peoples, RN  Outcome: Ongoing  Note:   Patient has coughing spells after talking and using IS. PRN cough medicine and scheduled breathing treatments as ordered. Remains on room air.   11/27/2017 1643 by Vernice Jefferson, RN  Outcome: Met This Shift     Problem: Infection - Central Venous Catheter-Associated Bloodstream Infection:  Goal: Will show no infection signs and symptoms  Description  Will show no infection signs and symptoms  11/27/2017 2214 by Ledell Peoples, RN  Outcome: Ongoing  Note:   Port a cath is accessed and currently infusing. Site and dressing intact. Will continue to monitor.   11/27/2017 1643 by Vernice Jefferson, RN  Outcome: Met This Shift     Problem: Bleeding:  Goal: Will show no signs and symptoms of excessive bleeding  Description  Will show no signs and symptoms of excessive bleeding  11/27/2017 2214 by Ledell Peoples, RN  Outcome: Ongoing  11/27/2017 1643 by Vernice Jefferson, RN  Outcome: Met This Shift     Problem: Pain:  Goal: Control of chronic pain  Description  Control of chronic pain  Outcome: Ongoing  Note:   Patient was given a Percocet for chronic back pain. Will re assess per policy.

## 2017-11-27 NOTE — Progress Notes (Signed)
RESPIRATORY THERAPY ASSESSMENT    Name:  David Watkins  Medical Record Number:  [id number]  Age: 62 y.o.   Gender: male  DOB: 1956-07-05  Today's Date:  11/27/2017  Room:  3518/3518-01    Assessment     Is the patient being admitted for a COPD or Asthma exacerbation?  Yes   (If yes the patient will be seen every 4 hours for the first 24 hours and then reassessed)    Patient Admission Diagnosis      Allergies  Allergies   Allergen Reactions   . Lenalidomide Rash   . Pomalidomide Hives   . Ceclor [Cefaclor] Hives       Minimum Predicted Vital Capacity:     1088          Actual Vital Capacity:      1700              Pulmonary History:no pulmonary hx listed; recent flu (Feb 2019)  Home Oxygen Therapy:  room air  Home Respiratory Therapy:Albuterol and Salmeterol/Fluticasone Propionate (pt states he hasn't used Advair "for many months" )  Current Respiratory Therapy:  HHN albuterol PRN, HHN formoterol BID, HHN budesonide BID, O2 protocol  Treatment Type: Aerosol Generator  Medications: Albuterol/Ipratropium    Respiratory Severity Index(RSI)   Patients with orders for inhalation medications, oxygen, or any therapeutic treatment modality will be placed on Respiratory Protocol.  They will be assessed with the first treatment and at least every 72 hours thereafter.  The following severity scale will be used to determine frequency of treatment intervention.    Smoking History: Mild Exacerbation = 3    Social History  Social History     Tobacco Use   . Smoking status: Former Smoker     Last attempt to quit: 11/25/2012     Years since quitting: 5.0   . Smokeless tobacco: Never Used   Substance Use Topics   . Alcohol use: No   . Drug use: No       Recent Surgical History: None = 0  Past Surgical History  Past Surgical History:   Procedure Laterality Date   . BONE MARROW BIOPSY     . BONE MARROW TRANSPLANT     . OTHER SURGICAL HISTORY Left 08/17/2016    trifusion cath placement   . PRE-MALIGNANT / BENIGN SKIN LESION EXCISION Left  03/29/2017    EXCISE LESION LEFT EXTERNAL EAR WITH FROZEN SECTION, FULL THICKNESS SKIN GRAFT       Level of Consciousness: Alert, Oriented, and Cooperative = 0    Level of Activity: Mostly sedentary, minimal walking = 2    Respiratory Pattern: Dyspnea with exertion;Irregular pattern;or RR less than 6 = 2    Breath Sounds: Absent bilaterally and/or with wheezes = 3    Sputum  Sputum Color: Cloudy, Tenacity: Thick, Sputum How Obtained: Spontaneous cough  Cough: Strong, spontaneous, non-productive = 0    Vital Signs   BP (!) 149/94   Pulse 99   Temp 97.9 F (36.6 C) (Oral)   Resp 18   Ht 5\' 9"  (1.753 m)   Wt 190 lb (86.2 kg)   SpO2 99%   BMI 28.06 kg/m   SPO2 (COPD values may differ): Greater than or equal to 92% on room air = 0    Peak Flow (asthma only): not applicable = 0    RSI: 9-10 = TID (three times daily) and Q4hr PRN for dyspnea  Plan       Goals: medication delivery and improve oxygenation    Patient/caregiver was educated on the proper method of use for Respiratory Care Devices:  Yes      Level of patient/caregiver understanding able to:   x Verbalize understanding   x Demonstrate understanding       ? Teach back        ? Needs reinforcement       ?  No available caregiver               ?  Other:     Response to education:  Excellent     Is patient being placed on Home Treatment Regimen?  No     Does the patient have everything they need prior to discharge?  NA     Comments: Chart reviewed.     Plan of Care: HHN albuterol Q4H and PRN/dyspnea, HHN Pulmicort/Perforomist BID, O2 protocol.    Electronically signed by Jannette Fogo, RCP on 11/27/2017 at 6:11 AM    Respiratory Protocol Guidelines     1. Assessment and treatment by Respiratory Therapy will be initiated for medication and therapeutic interventions upon initiation of aerosolized medication.  2. Physician will be contacted for respiratory rate (RR) greater than 35 breaths per minute. Therapy will be held for heart rate (HR) greater than  140 beats per minute, pending direction from physician.  3. Bronchodilators will be administered via Metered Dose Inhaler (MDI) with spacer when the following criteria are met:  a. Alert and cooperative     b. HR < 140 bpm  c. RR < 30 bpm                d. Can demonstrate a 2-3 second inspiratory hold  4. Bronchodilators will be administered via Hand Held Nebulizer [facility]) to patients when ANY of the following criteria are met  a. Incognizant or uncooperative          b. Patients treated with HHN at Home        c. Unable to demonstrate proper use of MDI with spacer     d. RR > 30 bpm   5. Bronchodilators will be delivered via Metered Dose Inhaler (MDI), HHN, Aerogen to intubated patients on mechanical ventilation.  6. Inhalation medication orders will be delivered and/or substituted as outlined below.    Aerosolized Medications Ordering and Administration Guidelines:    1. All Medications will be ordered by a physician, and their frequency and/or modality will be adjusted as defined by the patients Respiratory Severity Index (RSI) score.  2. If the patient does not have documented COPD, consider discontinuing anticholinergics when RSI is less than 9.  3. If the bronchospasm worsens (increased RSI), then the bronchodilator frequency can be increased to a maximum of every 4 hours.  If greater than every 4 hours is required, the physician will be contacted.  4. If the bronchospasm improves, the frequency of the bronchodilator can be decreased, based on the patient's RSI, but not less than home treatment regimen frequency.  5. Bronchodilator(s) will be discontinued if patient has a RSI less than 9 and has received no scheduled or as needed treatment for 72  Hrs.    Patients Ordered on a Mucolytic Agent:    1. Must always be administered with a bronchodilator.    2. Discontinue if patient experiences worsened bronchospasm, or secretions have lessened to the point that the patient is able to clear them with a  cough.    Anti-inflammatory and Combination Medications:    1. If the patient lacks prior history of lung disease, is not using inhaled anti-inflammatory medication at home, and lacks wheezing by examination or by history for at least 24 hours, contact physician for possible discontinuation.

## 2017-11-27 NOTE — Progress Notes (Signed)
Patient admitted to [facility] via stretcher from ED for diagnosis of COPD exacerbation.   Patient and significant other oriented to patient room including call light and bed controls.  Admission assessment completed - see admission flowsheet documentation.  Patient is a medium fall risk.  Safety measures instituted per policy.    Psychosocial Distress screening completed, with a score of 4 or greater in Other category. The patient has declined a consult regarding their cause of distress.  A request for psychological consult has not been placed to the attending physician.     Patient and significant other oriented to unit policies and procedures including: pain management practices, unit safety precautions, family rapid response, q4h vital signs and assessments, daily 4am lab draws, weekly chest x-rays, weekly VRE rectal swabs for surveillance, daily chlorhexidine bathing, standing transfusion orders, and routine central line care.  Also discussed use of call light and how to get in touch with nursing staff.  Stressed the importance of calling out immediately for any changes in condition including but not limited to: pain, chills, fever, nausea, vomiting, diarrhea, chest pain, sob/doe, assistance with toileting, bleeding, or any other symptoms that are out of the ordinary for the patient.  Patient verbalizes understanding of all instructions and will call for assistance as needed.

## 2017-11-27 NOTE — Progress Notes (Signed)
4 Eyes Admission Assessment     I agree as the admission nurse that 2 RN's have performed a thorough Head to Toe Skin Assessment on the patient. ALL assessment sites listed below have been assessed on admission.       Areas assessed by both nurses: Colletta McVille and Jovanie Verge  [x]    Head, Face, and Ears   [x]    Shoulders, Back, and Chest  [x]    Arms, Elbows, and Hands   [x]    Coccyx, Sacrum, and Ischum  [x]    Legs, Feet, and Heels        Does the Patient have Skin Breakdown?  No         Braden Prevention initiated:  No   Wound Care Orders initiated:  No      WOC nurse consulted for Pressure Injury (Stage 3,4, Unstageable, DTI, NWPT, and Complex wounds):  No      Nurse 1 eSignature: Electronically signed by Reynaldo Minium, RN on 11/27/17 at 2:51 AM    **SHARE this note so that the co-signing nurse is able to place an eSignature**    Nurse 2 eSignature: Electronically signed by Benny Lennert, RN on 11/27/17 at 3:54 AM

## 2017-11-27 NOTE — Plan of Care (Signed)
Problem: Falls - Risk of:  Goal: Will remain free from falls  Description  Will remain free from falls  Outcome: Ongoing  Note:   Orthostatic vital signs obtained at start of shift - see flowsheet for details.  Pt does not meet criteria for orthostasis.  Pt is a Med fall risk. See Lattie Corns Fall Score and ABCDS Injury Risk assessments.   - Screening for Orthostasis AND not a High Falls Risk per MORSE/ABCDS: Pt bed is in low position, side rails up, call light and belongings are in reach.  Fall risk light is on outside pts room.  Pt encouraged to call for assistance as needed. Will continue with hourly rounds for PO intake, pain needs, toileting and repositioning as needed.         Problem: Respiratory:  Goal: Ability to maintain a clear airway will improve  Description  Ability to maintain a clear airway will improve  Outcome: Ongoing  Note:   Pt admitted with COPD exacerbation.  Pt has dyspnea and cough upon ambulation.  O2 sat is 98%.  Pt cough subsides after a couple mins upon resting.  Pt gait is steady and ortho negative.  Will continue to monitor pt.      Problem: Infection - Central Venous Catheter-Associated Bloodstream Infection:  Goal: Will show no infection signs and symptoms  Description  Will show no infection signs and symptoms  Outcome: Ongoing  Note:   CVC site remains free of signs/symptoms of infection. No drainage, edema, erythema, pain, or warmth noted at site. Dressing changes continue per protocol and on an as needed basis - see flowsheet.        Problem: Discharge Planning:  Goal: Discharged to appropriate level of care  Description  Discharged to appropriate level of care  Outcome: Ongoing  Note:   Pt has been updated to POC and has no questions at this time.  Will continue to monitor.      Problem: Bleeding:  Goal: Will show no signs and symptoms of excessive bleeding  Description  Will show no signs and symptoms of excessive bleeding  Outcome: Ongoing  Note:   Patient's hemoglobin this AM:    Recent Labs     11/27/17  0037   HGB 10.5*     Patient's platelet count this AM:   Recent Labs     11/27/17  0037   PLT 97*    Thrombocytopenia Precautions in place.  Patient showing no signs or symptoms of active bleeding.  Transfusion not indicated at this time.  Patient verbalizes understanding of all instructions. Will continue to assess and implement POC. Call light within reach and hourly rounding in place.         Problem: Pain:  Goal: Pain level will decrease  Description  Pain level will decrease  Outcome: Ongoing  Note:   Pt has not complained of pain at this time.  Will continue to monitor.

## 2017-11-27 NOTE — Progress Notes (Signed)
CC:  COPD exacerbation    Returned from [place], worsening shortness of breath and nonproductive cough.  Seen in the ED and subsequently admitted.  High-dose steroids given, bronchodilator therapy, empiric levofloxacin.  Feels improved today, particularly with bronchodilators.  Afebrile  Rapid flu swab negative for influenza A and B antigens  WBC 6.7.  Hemoglobin 10.5 g.  Platelets 97  O2 saturation 94% on room air  NSR.  BP 127/81  S1, S2 decreased  Breath sounds moderately decreased, with few scattered wheezes and medium crackles  CTPA shows no pulmonary emboli.  Emphysema as before.  No acute pulmonary infiltrates.  No pleural disease.  Abdomen benign.  No peripheral edema.  Creatinine stable 0.8.  Electrolytes normal.  Glucose 172.    A&P: Exacerbation of COPD, uncertain trigger.  Symptoms however date back to mid February when he had a positive swab for influenza A.  He has improved on steroids, but he then relapses without clearing completely.  This current episode has been lasting well over 2 weeks.  Continuing steroid and bronchodilator therapy.  Cough suppressant.  Discussed with Dr. Hunt Oris.

## 2017-11-27 NOTE — Plan of Care (Signed)
Problem: Falls - Risk of:  Goal: Will remain free from falls  Description  Will remain free from falls  Outcome: Met This Shift     Orthostatic vital signs obtained at start of shift - see flowsheet for details.  Pt does not meet criteria for orthostasis.  Pt is a Med fall risk. See Lattie Corns Fall Score and ABCDS Injury Risk assessments.   - Screening for Orthostasis AND not a High Falls Risk per MORSE/ABCDS: Pt bed is in low position, side rails up, call light and belongings are in reach.  Fall risk light is on outside pts room.  Pt encouraged to call for assistance as needed. Will continue with hourly rounds for PO intake, pain needs, toileting and repositioning as needed.      Problem: Respiratory:  Goal: Ability to maintain a clear airway will improve  Description  Ability to maintain a clear airway will improve  Outcome: Met This Shift  Expiratory wheezes upon auscultation. Hycodan ordered for coughing. Scheduled breathing treatments ordered.     Problem: Infection - Central Venous Catheter-Associated Bloodstream Infection:  Goal: Will show no infection signs and symptoms  Description  Will show no infection signs and symptoms  Outcome: Met This Shift  CVC site remains free of signs/symptoms of infection. No drainage, edema, erythema, pain, or warmth noted at site. Dressing changes continue per protocol and on an as needed basis - see flowsheet.     Compliant with [facility] Bath Protocol:  Performed CHG bath today per [facility] protocol utilizing Bed bath with CHG wipes.  CVC site cleansed with CHG wipe over dressing, skin surrounding dressing, and first 6" of IV tubing.  Pt tolerated well.  Continued to encourage daily CHG bathing per [facility] protocol.    Problem: Bleeding:  Goal: Will show no signs and symptoms of excessive bleeding  Description  Will show no signs and symptoms of excessive bleeding  Outcome: Met This Shift    Pt. With no signs or symptoms of active bleeding this shift.   Hgb: 10.5 Plt: 97  Hgb threshold 7;  Platelet threshold 20. No blood product transfusion required d/t AM labs.  PT/PTT/INR being monitored routinely.  No bruising or petechiae noted.  Bleeding precautions in place and environment being monitored routinely for safety.

## 2017-11-27 NOTE — Progress Notes (Signed)
[facility] Progress Note    11/27/2017     David Watkins    MRN: [id number]    DOB: 1956-07-21    Referring MD: No referring provider defined for this encounter.      SUBJECTIVE:  Patient is doing welI.    ECOG PS:  (2) Ambulatory and capable of self care, unable to carry out work activity, up and about > 50% or waking hours    Isolation: None    Medications    Scheduled Meds:  ??? albuterol  2.5 mg Nebulization Q4H   ??? atorvastatin  10 mg Oral Daily   ??? calcium elemental  500 mg Oral BID   ??? gabapentin  300 mg Oral TID   ??? pantoprazole  40 mg Oral QAM AC   ??? sertraline  50 mg Oral Daily   ??? traZODone  50 mg Oral Nightly   ??? acyclovir  400 mg Oral TID   ??? sodium chloride flush  10 mL Intravenous 2 times per day   ??? Saline Mouthwash  15 mL Swish & Spit 4x Daily AC & HS   ??? enoxaparin  40 mg Subcutaneous Daily   ??? levofloxacin  750 mg Intravenous Q24H   ??? budesonide  0.5 mg Nebulization BID    And   ??? formoterol  20 mcg Nebulization BID     Continuous Infusions:  ??? sodium chloride     ??? sodium chloride 50 mL/hr at 11/26/17 2059     PRN Meds:.albuterol, diphenhydrAMINE, ondansetron, oxyCODONE-acetaminophen, prochlorperazine, sodium chloride, sodium chloride flush, potassium chloride, magnesium sulfate, magnesium hydroxide, Saline Mouthwash, alteplase    ROS:  As noted above, otherwise remainder of 10-point ROS negative    Physical Exam:     I&O:      Intake/Output Summary (Last 24 hours) at 11/27/2017 1434  Last data filed at 11/27/2017 1424  Gross per 24 hour   Intake 1127 ml   Output 1050 ml   Net 77 ml       Vital Signs:  BP 127/81    Pulse 78    Temp 98.3 ??F (36.8 ??C) (Oral)    Resp 18    Ht 5' 9"  (1.753 m)    Wt 191 lb 14.4 oz (87 kg)    SpO2 94%    BMI 28.34 kg/m??     Weight:    Wt Readings from Last 3 Encounters:   11/27/17 191 lb 14.4 oz (87 kg)   11/18/17 203 lb (92.1 kg)   11/15/17 197 lb (89.4 kg)         General: Awake, alert and oriented.  HEENT: normocephalic, PERRL, no scleral erythema or icterus, Oral  mucosa moist and intact, throat clear  NECK: supple without palpable adenopathy  BACK: Straight negative CVAT  SKIN: warm dry and intact without lesions rashes or masses  CHEST: CTA bilaterally without use of accessory muscles  CV: Normal S1 S2, RRR, no MRG  ABD: NT ND normoactive BS, no palpable masses or hepatosplenomegaly  EXTREMITIES: without edema, denies calf tenderness  NEURO: CN II - XII grossly intact  CATHETER: Right IJ chest Port (06/21/17, [facility])        Data    CBC:   Recent Labs     11/26/17  1534 11/27/17  0037   WBC 7.8 6.7   HGB 11.2* 10.5*   HCT 33.8* 31.3*   MCV 97.2 96.5   PLT 103* 97*     BMP/Mag:  Recent Labs  11/26/17  1534 11/27/17  0037   NA 140 137   K 4.4 4.7   CL 102 100   CO2 25 24   BUN 20 20   CREATININE 0.7* 0.8*   MG 1.90  --      LIVP:   Recent Labs     11/26/17  1534   AST 15   ALT 26   BILIDIR <0.2   BILITOT 0.5   ALKPHOS 67     Coags:   Recent Labs     11/27/17  0037   PROTIME 12.1   INR 1.06   APTT 25.8*     Uric Acid No results for input(s): LABURIC in the last 72 hours.      PROBLEM LIST: ??????????   ????  ??  ????  TREATMENT: ??????????   ??  1. Rad Tx to T5-7, T10-L3, Right Scapula - 3000 cGy - Dr. Jamas Lav 03/20/16-04/01/16  2. RVD x1 03/20/16 - discontinued d/t rash   3. Velcade/Pomalyst/Dex x3 cycles 04/23/16-06/17/16 - discontinued d/t reaction to pomalyst  4. Velcade/Dex x 1 cycle 06/26/16 (last dose of dexamethasone 07/22/16  5. High-dose melphalan followed by administration of PBSCs 2.36 x10^6 cd34cells/kg on 08/28/16  6. Maintenance Revlimid 30m daily (12/2016-04/23/17)  7. Dexamethasone 413mdaily (04/24/17)  8. DCEP               Cycle #1 - 04/26/17              Cycle #2 - 05/25/17   ????  ????  ASSESSMENT AND PLAN:??????????   ??  1.  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  Cycles 7-8 (21 day cycle)  - Dex 20 mg po D4,5.8,9,11 and 12  - Dara 16 mg/Kg D1 only with Dex 20 mg IV  - Velcade 1.3 mg/M2 D1,4,8 and 11  ??  Cycle 9 and beyond (maintenance, started 11/08/17)  Dara 16 mg/Kg Q 28 days  Dex 12 mg IV D1  Velcade Q 2 weeks  ??  PLAN: Dara Q 4 weeks and Velcade Q2 weeks.  He is considering an alloBMT as well in second CR if lung functin is adequate  ??  ??  2.  ID: Afebrile; Influenza A + 10/12/17.   - Cont Valtrex prophylaxis   - Cough: S/p Levaquin (10/05/17 - 10/11/17), medrol dose pak & Robitussin-AC (10/12/17)  - Influenza A + 10/12/17 s/p Tamiflu x 5 days.   - Cover with Levaquin beginning 11/26/2017 for possible bacterial infection  ??  3. Heme: Thrombocytopenia and anemia d/t chemotherapy  - Transfuse for Hgb < 7 and Platelets < 20K  - No transfusion today.   ??  4.  Metabolic: Stable renal fxn and e-lytes, steroid-induced hyperglycemia   - S/p Mag Ox (stopped 07/20/17)  ??  5. Pulmonary: shortness of breath, cough  - Pulm Nodule: Stable on repeat CT chest (04/23/17); cont to monitor. Stable on CTPA 10/08/17. Follow up in 6 months.  - Cont Albuterol inhaler prn & home nebulizer (Rx 10/12/17)  - CTPA 10/08/17: No PE; mild upper lobe  tree-in-bud opacities may represent bronchiolitis; Mild emphysema. Two 3 mm LLL pulmonary nodules  - Follows up with Dr. Dorann Lodge   - Currently on 83m BID of Prednisone. Increase to Solumedrol 40 Q 8 H 11/26/17  ??  6. GI/Nutrition:  Appetite and oral intake is good   - Cont low microbial diet        7. 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  - Continue Lipitor   - Echo (07/13/16): abnormal (paradoxical) septal motion is present with preserved LVEF 55-60%.     8. Anxiety/Depression: Ongoing and this has definitely increased w/ his relapsed disease.   - Cont Zoloft and trazadone daily  - Cont Ativan  PRN  - Psych to follow as needed      9. Insomnia: ongoing, has failed multiple sleep aids in the past.  No complaints during hospitalization   - Cont Trazodone nightly      10. Peripheral Neuropathy: Ongoing  - Cont Gabapentin 300 mg in AM, 3028mmidday, 60063mightly.     11. Bone Health: H/o spinal cord compression & diffuse lytic lesions t/o skeleton  - CT Chest (04/23/17) - multiple lytic lesions throughout the skeleton   - Cont Ca/Vit D & zometa   - Cont Zometa monthly (given 10/12/17, next due 11/09/17)  ??  12.  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 FriMaceo Pro 10 days (started 09/01/17, completed 09/15/17).  ??  13. Conjuctivitis: resolved   - Refer to ophthalmology  ??        JamHarlene SaltsD

## 2017-11-28 LAB — CBC WITH AUTO DIFFERENTIAL
Basophils %: 0.1 %
Basophils Absolute: 0 10*3/uL (ref 0.0–0.2)
Eosinophils %: 0.3 %
Eosinophils Absolute: 0 10*3/uL (ref 0.0–0.6)
Hematocrit: 30.3 % — ABNORMAL LOW (ref 40.5–52.5)
Hemoglobin: 10 g/dL — ABNORMAL LOW (ref 13.5–17.5)
Lymphocytes %: 10.4 %
Lymphocytes Absolute: 0.4 10*3/uL — ABNORMAL LOW (ref 1.0–5.1)
MCH: 32.3 pg (ref 26.0–34.0)
MCHC: 33.1 g/dL (ref 31.0–36.0)
MCV: 97.5 fL (ref 80.0–100.0)
MPV: 8 fL (ref 5.0–10.5)
Monocytes %: 11.6 %
Monocytes Absolute: 0.5 10*3/uL (ref 0.0–1.3)
Neutrophils %: 77.6 %
Neutrophils Absolute: 3.2 10*3/uL (ref 1.7–7.7)
Platelets: 87 10*3/uL — ABNORMAL LOW (ref 135–450)
RBC: 3.11 M/uL — ABNORMAL LOW (ref 4.20–5.90)
RDW: 18 % — ABNORMAL HIGH (ref 12.4–15.4)
WBC: 4.1 10*3/uL (ref 4.0–11.0)

## 2017-11-28 LAB — BASIC METABOLIC PANEL
Anion Gap: 11 (ref 3–16)
BUN: 20 mg/dL (ref 7–20)
CO2: 26 mmol/L (ref 21–32)
Calcium: 9.2 mg/dL (ref 8.3–10.6)
Chloride: 104 mmol/L (ref 99–110)
Creatinine: 0.8 mg/dL — ABNORMAL LOW (ref 0.9–1.3)
GFR African American: >60 (ref >60)
GFR Non-African American: >60 (ref >60)
Glucose: 104 mg/dL — ABNORMAL HIGH (ref 70–99)
Potassium: 3.7 mmol/L (ref 3.5–5.1)
Sodium: 141 mmol/L (ref 136–145)

## 2017-11-28 LAB — CULTURE, URINE: Urine Culture, Routine: NO GROWTH

## 2017-11-28 LAB — CULTURE, VRE: VRE Culture: NEGATIVE

## 2017-11-28 MED ORDER — PREDNISONE 50 MG PO TABS
50 MG | Freq: Every day | ORAL | Status: DC
Start: 2017-11-28 — End: 2017-11-29
  Administered 2017-11-28 – 2017-11-29 (×2): 50 mg via ORAL

## 2017-11-28 MED ORDER — LEVOFLOXACIN 750 MG PO TABS
750 MG | Freq: Every day | ORAL | Status: DC
Start: 2017-11-28 — End: 2017-11-29
  Administered 2017-11-29: 01:00:00 750 mg via ORAL

## 2017-11-28 MED FILL — ALBUTEROL SULFATE (2.5 MG/3ML) 0.083% IN NEBU: RESPIRATORY_TRACT | Qty: 3

## 2017-11-28 MED FILL — HYDROCODONE-HOMATROPINE 5-1.5 MG/5ML PO SYRP: 5-1.5 MG/5ML | ORAL | Qty: 5

## 2017-11-28 MED FILL — PREDNISONE 50 MG PO TABS: 50 mg | ORAL | Qty: 1

## 2017-11-28 MED FILL — OYSTER SHELL CALCIUM 500 MG PO TABS: 500 mg | ORAL | Qty: 1

## 2017-11-28 MED FILL — PANTOPRAZOLE SODIUM 40 MG PO TBEC: 40 mg | ORAL | Qty: 1

## 2017-11-28 MED FILL — BANOPHEN 25 MG PO TABS: 25 mg | ORAL | Qty: 1

## 2017-11-28 MED FILL — BUDESONIDE 0.5 MG/2ML IN SUSP: 0.5 MG/2ML | RESPIRATORY_TRACT | Qty: 2

## 2017-11-28 MED FILL — PERFOROMIST 20 MCG/2ML IN NEBU: 20 MCG/2ML | RESPIRATORY_TRACT | Qty: 2

## 2017-11-28 MED FILL — TRAZODONE HCL 50 MG PO TABS: 50 mg | ORAL | Qty: 1

## 2017-11-28 MED FILL — GABAPENTIN 300 MG PO CAPS: 300 mg | ORAL | Qty: 1

## 2017-11-28 MED FILL — ACYCLOVIR 200 MG PO CAPS: 200 mg | ORAL | Qty: 2

## 2017-11-28 MED FILL — ALBUTEROL SULFATE (2.5 MG/3ML) 0.083% IN NEBU: RESPIRATORY_TRACT | Qty: 6

## 2017-11-28 MED FILL — LOVENOX 40 MG/0.4ML SC SOLN: 40 MG/0.4ML | SUBCUTANEOUS | Qty: 0.4

## 2017-11-28 MED FILL — LEVOFLOXACIN IN D5W 750 MG/150ML IV SOLN: 750 MG/150ML | INTRAVENOUS | Qty: 150

## 2017-11-28 MED FILL — SERTRALINE HCL 50 MG PO TABS: 50 mg | ORAL | Qty: 1

## 2017-11-28 MED FILL — OXYCODONE-ACETAMINOPHEN 7.5-325 MG PO TABS: 7.5-325 mg | ORAL | Qty: 1

## 2017-11-28 MED FILL — SODIUM CHLORIDE 0.9 % IV SOLN: 0.9 % | INTRAVENOUS | Qty: 1000

## 2017-11-28 MED FILL — ALBUTEROL SULFATE (2.5 MG/3ML) 0.083% IN NEBU: RESPIRATORY_TRACT | Qty: 9

## 2017-11-28 MED FILL — LIPITOR 20 MG PO TABS: 20 mg | ORAL | Qty: 1

## 2017-11-28 NOTE — Plan of Care (Signed)
Problem: Falls - Risk of:  Goal: Will remain free from falls  Description  Will remain free from falls  Outcome: Met This Shift     Orthostatic vital signs obtained at start of shift - see flowsheet for details.  Pt does not meet criteria for orthostasis.  Pt is a Med fall risk. See Leamon Arnt Fall Score and ABCDS Injury Risk assessments.   Pt bed is in low position, side rails up, call light and belongings are in reach.  Fall risk light is on outside pts room.  Pt encouraged to call for assistance as needed. Will continue with hourly rounds for PO intake, pain needs, toileting and repositioning as needed.      Problem: Respiratory:  Goal: Ability to maintain a clear airway will improve  Description  Ability to maintain a clear airway will improve  Outcome: Met This Shift  Expiratory wheezes upon auscultation. Hycodan ordered for coughing. Scheduled breathing treatments ordered.     Problem: Infection - Central Venous Catheter-Associated Bloodstream Infection:  Goal: Will show no infection signs and symptoms  Description  Will show no infection signs and symptoms  Outcome: Met This Shift  CVC site remains free of signs/symptoms of infection. No drainage, edema, erythema, pain, or warmth noted at site. Dressing changes continue per protocol and on an as needed basis - see flowsheet. Performed CHG bath today per [facility] protocol utilizing Bed bath with CHG wipes.  CVC site cleansed with CHG wipe over dressing, skin surrounding dressing, and first 6" of IV tubing.  Pt tolerated well.  Continued to encourage daily CHG bathing per [facility] protocol.    Problem: Bleeding:  Goal: Will show no signs and symptoms of excessive bleeding  Description  Will show no signs and symptoms of excessive bleeding  Outcome: Met This Shift    Pt. With no signs or symptoms of active bleeding this shift.   Hgb: 10.5 Plt: 97  Hgb threshold 7; Platelet threshold 20. No blood product transfusion required d/t AM labs.  PT/PTT/INR being monitored  routinely.  No bruising or petechiae noted.  Bleeding precautions in place and environment being monitored routinely for safety.

## 2017-11-28 NOTE — Progress Notes (Signed)
[place] Progress Note    11/28/2017     David Watkins    MRN: [id number]    DOB: [birth year]    Referring MD: No referring provider defined for this encounter.      SUBJECTIVE:  Patient is feeling better, but no where near baseline.  Cough improved, but not resolved.    ECOG PS:  (2) Ambulatory and capable of self care, unable to carry out work activity, up and about > 50% or waking hours    Isolation: None    Medications    Scheduled Meds:  ??? levofloxacin  750 mg Oral Daily   ??? predniSONE  50 mg Oral Daily   ??? albuterol  2.5 mg Nebulization Q4H   ??? atorvastatin  10 mg Oral Daily   ??? calcium elemental  500 mg Oral BID   ??? gabapentin  300 mg Oral TID   ??? pantoprazole  40 mg Oral QAM AC   ??? sertraline  50 mg Oral Daily   ??? traZODone  50 mg Oral Nightly   ??? acyclovir  400 mg Oral TID   ??? sodium chloride flush  10 mL Intravenous 2 times per day   ??? Saline Mouthwash  15 mL Swish & Spit 4x Daily AC & HS   ??? enoxaparin  40 mg Subcutaneous Daily   ??? budesonide  0.5 mg Nebulization BID    And   ??? formoterol  20 mcg Nebulization BID     Continuous Infusions:  ??? sodium chloride       PRN Meds:.albuterol, diphenhydrAMINE, HYDROcodone-homatropine, ondansetron, oxyCODONE-acetaminophen, prochlorperazine, sodium chloride, sodium chloride flush, potassium chloride, magnesium sulfate, magnesium hydroxide, Saline Mouthwash, alteplase    ROS:  As noted above, otherwise remainder of 10-point ROS negative    Physical Exam:     I&O:      Intake/Output Summary (Last 24 hours) at 11/28/2017 0928  Last data filed at 11/28/2017 0630  Gross per 24 hour   Intake 2175 ml   Output 950 ml   Net 1225 ml       Vital Signs:  BP 124/72    Pulse 96    Temp 97.7 ??F (36.5 ??C) (Oral)    Resp 18    Ht 5' 9"  (1.753 m)    Wt 195 lb 6.4 oz (88.6 kg)    SpO2 95%    BMI 28.86 kg/m??     Weight:    Wt Readings from Last 3 Encounters:   11/28/17 195 lb 6.4 oz (88.6 kg)   11/18/17 203 lb (92.1 kg)   11/15/17 197 lb (89.4 kg)         General: Awake, alert and  oriented.  HEENT: normocephalic, PERRL, no scleral erythema or icterus, Oral mucosa moist and intact, throat clear  NECK: supple without palpable adenopathy  BACK: Straight negative CVAT  SKIN: warm dry and intact without lesions rashes or masses  CHEST: exp wheezing in all lung fields  CV: Normal S1 S2, RRR, no MRG  ABD: NT ND normoactive BS, no palpable masses or hepatosplenomegaly  EXTREMITIES: without edema, denies calf tenderness  NEURO: CN II - XII grossly intact  CATHETER: Right IJ chest Port (06/21/17, [facility])        Data    CBC:   Recent Labs     11/26/17  1534 11/27/17  0037 11/28/17  0335   WBC 7.8 6.7 4.1   HGB 11.2* 10.5* 10.0*   HCT 33.8* 31.3* 30.3*   MCV  97.2 96.5 97.5   PLT 103* 97* 87*     BMP/Mag:  Recent Labs     11/26/17  1534 11/27/17  0037 11/28/17  0335   NA 140 137 141   K 4.4 4.7 3.7   CL 102 100 104   CO2 25 24 26    BUN 20 20 20    CREATININE 0.7* 0.8* 0.8*   MG 1.90  --   --      LIVP:   Recent Labs     11/26/17  1534   AST 15   ALT 26   BILIDIR <0.2   BILITOT 0.5   ALKPHOS 67     Coags:   Recent Labs     11/27/17  0037   PROTIME 12.1   INR 1.06   APTT 25.8*     Uric Acid No results for input(s): LABURIC in the last 72 hours.      PROBLEM LIST: ??????????   ????  ??  ????  TREATMENT: ??????????   ??  1. Rad Tx to T5-7, T10-L3, Right Scapula - 3000 cGy - Dr. Jamas Lav 03/20/16-04/01/16  2. RVD x1 03/20/16 - discontinued d/t rash   3. Velcade/Pomalyst/Dex x3 cycles 04/23/16-06/17/16 - discontinued d/t reaction to pomalyst  4. Velcade/Dex x 1 cycle 06/26/16 (last dose of dexamethasone 07/22/16  5. High-dose melphalan followed by administration of PBSCs 2.36 x10^6 cd34cells/kg on 08/28/16  6. Maintenance Revlimid 70m daily (12/2016-04/23/17)  7. Dexamethasone 431mdaily (04/24/17)  8. DCEP               Cycle #1 - 04/26/17              Cycle #2 - 05/25/17   ????  ????  ASSESSMENT AND PLAN:??????????   ??  1.  IgG kappa multiple myeloma / Plasma Cell Leukemia: [place] relapse of his disease in 03/2017 w/ progressive anorexia &  weakness, 17 lb wt loss & severe thrombocytopenia   - S/p 2 cycles of DCEP with good response (M spike 0.3 (10/15) from 6.7 (8/26), KLC 13.2 (10/15) from 637 (8/26) & IgG down to 917 (10/15) from 6379 (8/26)  - Cont Dara/Velcade/Dex (C1D1 - 06/22/17)  - BMBx  10/18/17 (OPO, 10/18/17) - CR  ??  Cycles 7-8 (21 day cycle)  - Dex 20 mg po D4,5.8,9,11 and 12  - Dara 16 mg/Kg D1 only with Dex 20 mg IV  - Velcade 1.3 mg/M2 D1,4,8 and 11  ??  Cycle 9 and beyond (maintenance, started 11/08/17)  Dara 16 mg/Kg Q 28 days  Dex 12 mg IV D1  Velcade Q 2 weeks  ??  PLAN: Dara Q 4 weeks and Velcade Q2 weeks.  He is considering an alloBMT as well in second CR if lung functin is adequate  ??  ??  2.  ID: Afebrile; Influenza A + 10/12/17.   - Cont Valtrex prophylaxis   - Cough: S/p Levaquin (10/05/17 - 10/11/17), medrol dose pak & Robitussin-AC (10/12/17)  - Influenza A + 10/12/17 s/p Tamiflu x 5 days.   - Cover with Levaquin beginning 11/26/2017 for possible bacterial infection.  Change to po 3/31 and plan on a total of 7 days  ??  3. Heme: Thrombocytopenia and anemia d/t chemotherapy  - Transfuse for Hgb < 7 and Platelets < 20K  - No transfusion today.   ??  4.  Metabolic: Stable renal fxn and e-lytes, steroid-induced hyperglycemia   - S/p Mag Ox (stopped 07/20/17)  ??  5. Pulmonary:  shortness of breath, cough  - Pulm Nodule: Stable on repeat CT chest (04/23/17); cont to monitor. Stable on CTPA 10/08/17. Follow up in 6 months.  - Cont Albuterol inhaler prn & home nebulizer (Rx 10/12/17)  - CTPA 10/08/17: No PE; mild upper lobe tree-in-bud opacities may represent bronchiolitis; Mild emphysema. Two 3 mm LLL pulmonary nodules  - Follows up with Dr. Dorann Lodge   - Currently on 51m BID of Prednisone. Increase to Solumedrol 40 Q 8 H 11/26/17 (not started when cam up from the ED).  Prednisone 50 mg po this AM.  ??  6. GI/Nutrition:  Appetite and oral intake is good   - Cont low microbial diet        7. 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  -  Continue Lipitor   - Echo (07/13/16): abnormal (paradoxical) septal motion is present with preserved LVEF 55-60%.     8. Anxiety/Depression: Ongoing and this has definitely increased w/ his relapsed disease.   - Cont Zoloft and trazadone daily  - Cont Ativan PRN  - Psych to follow as needed      9. Insomnia: ongoing, has failed multiple sleep aids in the past.  No complaints during hospitalization   - Cont Trazodone nightly      10. Peripheral Neuropathy: Ongoing  - Cont Gabapentin 300 mg in AM, 3072mmidday, 60041mightly.     11. Bone Health: H/o spinal cord compression & diffuse lytic lesions t/o skeleton  - CT Chest (04/23/17) - multiple lytic lesions throughout the skeleton   - Cont Ca/Vit D & zometa   - Cont Zometa monthly (given 10/12/17, next due 11/09/17)  ??  12.  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 FriMaceo Pro 10 days (started 09/01/17, completed 09/15/17).  ??  13. Conjuctivitis: resolved   - Refer to ophthalmology  ??  14. Disposition: home Monday if continues to improve on an all oral regimen.      JamHarlene SaltsD

## 2017-11-28 NOTE — Progress Notes (Signed)
RESPIRATORY THERAPY ASSESSMENT    Name:  David Watkins  Medical Record Number:  [id number]  Age: 62 y.o.   Gender: male  DOB: 05-26-1956  Today's Date:  11/28/2017  Room:  3518/3518-01    Assessment     Is the patient being admitted for a COPD or Asthma exacerbation?  Yes   (If yes the patient will be seen every 4 hours for the first 24 hours and then reassessed)    Patient Admission Diagnosis      Allergies  Allergies   Allergen Reactions   . Lenalidomide Rash   . Pomalidomide Hives   . Ceclor [Cefaclor] Hives       Minimum Predicted Vital Capacity:     1088           Actual Vital Capacity:      2250              Pulmonary History:no pulmonary hx listed; recent flu  Home Oxygen Therapy:  room air  Home Respiratory Therapy:Albuterol and Salmeterol/Fluticasone Propionate   Current Respiratory Therapy:  HHN albuterol Q4H, HHN Perforomist/Pulmicort BID, O2 protocol  Treatment Type: HHN  Medications: Albuterol    Respiratory Severity Index(RSI)   Patients with orders for inhalation medications, oxygen, or any therapeutic treatment modality will be placed on Respiratory Protocol.  They will be assessed with the first treatment and at least every 72 hours thereafter.  The following severity scale will be used to determine frequency of treatment intervention.    Smoking History: Mild Exacerbation = 3    Social History  Social History     Tobacco Use   . Smoking status: Former Smoker     Last attempt to quit: 11/25/2012     Years since quitting: 5.0   . Smokeless tobacco: Never Used   Substance Use Topics   . Alcohol use: No   . Drug use: No       Recent Surgical History: None = 0  Past Surgical History  Past Surgical History:   Procedure Laterality Date   . BONE MARROW BIOPSY     . BONE MARROW TRANSPLANT     . OTHER SURGICAL HISTORY Left 08/17/2016    trifusion cath placement   . PRE-MALIGNANT / BENIGN SKIN LESION EXCISION Left 03/29/2017    EXCISE LESION LEFT EXTERNAL EAR WITH FROZEN SECTION, FULL THICKNESS SKIN GRAFT        Level of Consciousness: Alert, Oriented, and Cooperative = 0    Level of Activity: Mostly sedentary, minimal walking = 2    Respiratory Pattern: Dyspnea with exertion;Irregular pattern;or RR less than 6 = 2    Breath Sounds: Absent bilaterally and/or with wheezes = 3    Sputum  Sputum Color: Cloudy, Tenacity: Thick, Sputum How Obtained: Spontaneous cough  Cough: Strong, spontaneous, non-productive = 0    Vital Signs   BP 124/72   Pulse 96   Temp 97.7 F (36.5 C) (Oral)   Resp 18   Ht 5\' 9"  (1.753 m)   Wt 195 lb 6.4 oz (88.6 kg)   SpO2 95%   BMI 28.86 kg/m   SPO2 (COPD values may differ): Greater than or equal to 92% on room air = 0    Peak Flow (asthma only): not applicable = 0    RSI: 9-10 = TID (three times daily) and Q4hr PRN for dyspnea        Plan       Goals: medication delivery  Patient/caregiver was educated on the proper method of use for Respiratory Care Devices:  Yes      Level of patient/caregiver understanding able to:   ? Verbalize understanding   ? Demonstrate understanding       ? Teach back        ? Needs reinforcement       ?  No available caregiver               ?  Other:     Response to education:  Excellent     Is patient being placed on Home Treatment Regimen?  No     Does the patient have everything they need prior to discharge?  NA     Comments: Pt states that the nebulizer treatments help a lot and that he would like to continue them Q4H.    Plan of Care: HHN albuterol Q4H, HHN Pulmicort/Perforamist BID, O2 protocol.    Electronically signed by Jannette Fogo, RCP on 11/28/2017 at 9:06 AM    Respiratory Protocol Guidelines     1. Assessment and treatment by Respiratory Therapy will be initiated for medication and therapeutic interventions upon initiation of aerosolized medication.  2. Physician will be contacted for respiratory rate (RR) greater than 35 breaths per minute. Therapy will be held for heart rate (HR) greater than 140 beats per minute, pending direction from  physician.  3. Bronchodilators will be administered via Metered Dose Inhaler (MDI) with spacer when the following criteria are met:  a. Alert and cooperative     b. HR < 140 bpm  c. RR < 30 bpm                d. Can demonstrate a 2-3 second inspiratory hold  4. Bronchodilators will be administered via Hand Held Nebulizer [facility]) to patients when ANY of the following criteria are met  a. Incognizant or uncooperative          b. Patients treated with HHN at Home        c. Unable to demonstrate proper use of MDI with spacer     d. RR > 30 bpm   5. Bronchodilators will be delivered via Metered Dose Inhaler (MDI), HHN, Aerogen to intubated patients on mechanical ventilation.  6. Inhalation medication orders will be delivered and/or substituted as outlined below.    Aerosolized Medications Ordering and Administration Guidelines:    1. All Medications will be ordered by a physician, and their frequency and/or modality will be adjusted as defined by the patients Respiratory Severity Index (RSI) score.  2. If the patient does not have documented COPD, consider discontinuing anticholinergics when RSI is less than 9.  3. If the bronchospasm worsens (increased RSI), then the bronchodilator frequency can be increased to a maximum of every 4 hours.  If greater than every 4 hours is required, the physician will be contacted.  4. If the bronchospasm improves, the frequency of the bronchodilator can be decreased, based on the patient's RSI, but not less than home treatment regimen frequency.  5. Bronchodilator(s) will be discontinued if patient has a RSI less than 9 and has received no scheduled or as needed treatment for 72  Hrs.    Patients Ordered on a Mucolytic Agent:    1. Must always be administered with a bronchodilator.    2. Discontinue if patient experiences worsened bronchospasm, or secretions have lessened to the point that the patient is able to clear them with a cough.  Anti-inflammatory and Combination  Medications:    1. If the patient lacks prior history of lung disease, is not using inhaled anti-inflammatory medication at home, and lacks wheezing by examination or by history for at least 24 hours, contact physician for possible discontinuation.

## 2017-11-28 NOTE — Progress Notes (Signed)
CC: COPD exacerbation    Feeling significantly improved.  Significant benefit from nebulized bronchodilator.  Coughing less.  Cough suppressant has been helpful.  Afebrile  WBC 4.1.  Hemoglobin stable 10.0 g  No positive cultures or serologies.  Levaquin empirically  BP 145/92.  NSR.  S1, S2 decreased  O2 saturation 96% on room air.  Breath sounds mildly reduced, rare expiratory low pitched wheeze.  Few medium crackles  Abdomen soft, no tenderness.  No peripheral edema.  Creatinine stable 0.8.  Electrolytes normal.    A&P: Exacerbation of COPD, improving with high-dose steroid and frequent bronchodilator therapy.  Does not have hypoxemia associated with this.  Anticipate discharge in 24-48 hours, with same aggressive therapy

## 2017-11-29 ENCOUNTER — Inpatient Hospital Stay: Payer: BLUE CROSS/BLUE SHIELD | Primary: Hematology & Oncology

## 2017-11-29 LAB — HEPATIC FUNCTION PANEL
ALT: 26 U/L (ref 10–40)
AST: 15 U/L (ref 15–37)
Albumin: 3.9 g/dL (ref 3.4–5.0)
Alkaline Phosphatase: 49 U/L (ref 40–129)
Bilirubin, Direct: <0.2 mg/dL (ref 0.0–0.3)
Total Bilirubin: 0.4 mg/dL (ref 0.0–1.0)
Total Protein: 6.5 g/dL (ref 6.4–8.2)

## 2017-11-29 LAB — CBC WITH AUTO DIFFERENTIAL
Basophils %: 0.3 %
Basophils Absolute: 0 10*3/uL (ref 0.0–0.2)
Eosinophils %: 0.1 %
Eosinophils Absolute: 0 10*3/uL (ref 0.0–0.6)
Hematocrit: 34.2 % — ABNORMAL LOW (ref 40.5–52.5)
Hemoglobin: 11.5 g/dL — ABNORMAL LOW (ref 13.5–17.5)
Lymphocytes %: 9.8 %
Lymphocytes Absolute: 0.6 10*3/uL — ABNORMAL LOW (ref 1.0–5.1)
MCH: 32.5 pg (ref 26.0–34.0)
MCHC: 33.7 g/dL (ref 31.0–36.0)
MCV: 96.4 fL (ref 80.0–100.0)
MPV: 7.1 fL (ref 5.0–10.5)
Monocytes %: 8.1 %
Monocytes Absolute: 0.5 10*3/uL (ref 0.0–1.3)
Neutrophils %: 81.7 %
Neutrophils Absolute: 5.1 10*3/uL (ref 1.7–7.7)
Platelets: 97 10*3/uL — ABNORMAL LOW (ref 135–450)
RBC: 3.55 M/uL — ABNORMAL LOW (ref 4.20–5.90)
RDW: 18.4 % — ABNORMAL HIGH (ref 12.4–15.4)
WBC: 6.3 10*3/uL (ref 4.0–11.0)

## 2017-11-29 LAB — BASIC METABOLIC PANEL
Anion Gap: 16 (ref 3–16)
BUN: 19 mg/dL (ref 7–20)
CO2: 25 mmol/L (ref 21–32)
Calcium: 9.7 mg/dL (ref 8.3–10.6)
Chloride: 98 mmol/L — ABNORMAL LOW (ref 99–110)
Creatinine: 0.7 mg/dL — ABNORMAL LOW (ref 0.9–1.3)
GFR African American: >60 (ref >60)
GFR Non-African American: >60 (ref >60)
Glucose: 169 mg/dL — ABNORMAL HIGH (ref 70–99)
Potassium: 4.5 mmol/L (ref 3.5–5.1)
Sodium: 139 mmol/L (ref 136–145)

## 2017-11-29 LAB — PROTIME-INR
INR: 1.08 (ref 0.86–1.14)
Protime: 12.3 s (ref 9.8–13.0)

## 2017-11-29 LAB — URIC ACID: Uric Acid, Serum: 3.2 mg/dL — ABNORMAL LOW (ref 3.5–7.2)

## 2017-11-29 LAB — PHOSPHORUS: Phosphorus: 4.3 mg/dL (ref 2.5–4.9)

## 2017-11-29 LAB — LACTATE DEHYDROGENASE: LD: 207 U/L — ABNORMAL HIGH (ref 100–190)

## 2017-11-29 LAB — APTT: aPTT: 28.8 s (ref 26.0–36.0)

## 2017-11-29 MED ORDER — LEVOFLOXACIN 750 MG PO TABS
750 MG | ORAL_TABLET | Freq: Every day | ORAL | 0 refills | Status: AC
Start: 2017-11-29 — End: 2017-12-03

## 2017-11-29 MED ORDER — GUAIFENESIN-CODEINE 100-10 MG/5ML PO SOLN
100-10 | ORAL | 0 refills | Status: AC | PRN
Start: 2017-11-29 — End: 2017-12-06

## 2017-11-29 MED ORDER — DIPHENHYDRAMINE HCL 25 MG PO TABS
25 MG | Freq: Every evening | ORAL | Status: AC | PRN
Start: 2017-11-29 — End: 2017-12-29

## 2017-11-29 MED ORDER — SODIUM CHLORIDE 0.9 % IV SOLN
0.9 % | INTRAVENOUS | Status: AC
Start: 2017-11-29 — End: 2017-11-29

## 2017-11-29 MED ORDER — PREDNISONE 10 MG PO TABS
10 MG | ORAL_TABLET | Freq: Every day | ORAL | 0 refills | Status: AC
Start: 2017-11-29 — End: 2017-12-29

## 2017-11-29 MED ORDER — HEPARIN SOD (PORK) LOCK FLUSH 100 UNIT/ML IV SOLN
100 UNIT/ML | Freq: Once | INTRAVENOUS | Status: AC
Start: 2017-11-29 — End: 2017-11-29
  Administered 2017-11-29: 15:00:00 500 [IU]

## 2017-11-29 MED FILL — ALBUTEROL SULFATE (2.5 MG/3ML) 0.083% IN NEBU: RESPIRATORY_TRACT | Qty: 3

## 2017-11-29 MED FILL — PERFOROMIST 20 MCG/2ML IN NEBU: 20 MCG/2ML | RESPIRATORY_TRACT | Qty: 2

## 2017-11-29 MED FILL — PANTOPRAZOLE SODIUM 40 MG PO TBEC: 40 mg | ORAL | Qty: 1

## 2017-11-29 MED FILL — ACYCLOVIR 200 MG PO CAPS: 200 mg | ORAL | Qty: 2

## 2017-11-29 MED FILL — BANOPHEN 25 MG PO TABS: 25 mg | ORAL | Qty: 1

## 2017-11-29 MED FILL — LEVAQUIN 750 MG PO TABS: 750 mg | ORAL | Qty: 1

## 2017-11-29 MED FILL — HYDROCODONE-HOMATROPINE 5-1.5 MG/5ML PO SYRP: 5-1.5 MG/5ML | ORAL | Qty: 5

## 2017-11-29 MED FILL — PREDNISONE 50 MG PO TABS: 50 mg | ORAL | Qty: 1

## 2017-11-29 MED FILL — OYSTER SHELL CALCIUM 500 MG PO TABS: 500 mg | ORAL | Qty: 1

## 2017-11-29 MED FILL — GABAPENTIN 300 MG PO CAPS: 300 mg | ORAL | Qty: 1

## 2017-11-29 MED FILL — HEPARIN LOCK FLUSH 100 UNIT/ML IV SOLN: 100 [IU]/mL | INTRAVENOUS | Qty: 5

## 2017-11-29 MED FILL — LIPITOR 20 MG PO TABS: 20 mg | ORAL | Qty: 1

## 2017-11-29 MED FILL — BUDESONIDE 0.5 MG/2ML IN SUSP: 0.5 MG/2ML | RESPIRATORY_TRACT | Qty: 2

## 2017-11-29 MED FILL — ALBUTEROL SULFATE (2.5 MG/3ML) 0.083% IN NEBU: RESPIRATORY_TRACT | Qty: 9

## 2017-11-29 MED FILL — SODIUM CHLORIDE 0.9 % IV SOLN: 0.9 % | INTRAVENOUS | Qty: 1000

## 2017-11-29 MED FILL — OXYCODONE-ACETAMINOPHEN 7.5-325 MG PO TABS: 7.5-325 mg | ORAL | Qty: 1

## 2017-11-29 MED FILL — TRAZODONE HCL 50 MG PO TABS: 50 mg | ORAL | Qty: 1

## 2017-11-29 MED FILL — SERTRALINE HCL 50 MG PO TABS: 50 mg | ORAL | Qty: 1

## 2017-11-29 NOTE — Progress Notes (Signed)
CC: COPD exacerbation.    Continues to improve, ready to go home today.  Coughing less.  Afebrile  WBC 6.3  Continuing empiric levofloxacin, and steroids  BP 115/74.  Pulse 104.  O2 saturation 97% on RA  Breath sounds mildly reduced.  No wheezes, crackles, or rhonchi.  No peripheral edema.  Creatinine stable 0.7.  Blood sugar lites normal.    A&P: COPD exacerbation continues to improve with high-dose steroids and bronchodilators.  Agree with discharge home today.  Continue current dose of prednisone 50 mg daily, and bronchodilators.  I will see him in approximately a week.  Discussed with Dr.Essell

## 2017-11-29 NOTE — Plan of Care (Signed)
Problem: Falls - Risk of:  Goal: Will remain free from falls  Description  Will remain free from falls  Outcome: Ongoing  Note:   Orthostatic vital signs obtained at start of shift - see flowsheet for details.  Pt meets criteria for orthostasis.  Pt is a Med fall risk. See Leamon Arnt Fall Score and ABCDS Injury Risk assessments.   - Screening for Orthostasis AND not a High Falls Risk per MORSE/ABCDS: Pt bed is in low position, side rails up, call light and belongings are in reach.  Fall risk light is on outside pts room.  Pt encouraged to call for assistance as needed. Will continue with hourly rounds for PO intake, pain needs, toileting and repositioning as needed.         Problem: Respiratory:  Goal: Ability to maintain a clear airway will improve  Description  Ability to maintain a clear airway will improve  Outcome: Ongoing  Note:   Pt is still having coughing fits, but O2 sats are staying at or above 92%.      Problem: Infection - Central Venous Catheter-Associated Bloodstream Infection:  Goal: Will show no infection signs and symptoms  Description  Will show no infection signs and symptoms  Outcome: Ongoing  Note:   CVC site remains free of signs/symptoms of infection. No drainage, edema, erythema, pain, or warmth noted at site. Dressing changes continue per protocol and on an as needed basis - see flowsheet.          Problem: Discharge Planning:  Goal: Discharged to appropriate level of care  Description  Discharged to appropriate level of care  Outcome: Ongoing  Note:   Pt is aware of current plan of care.      Problem: Bleeding:  Goal: Will show no signs and symptoms of excessive bleeding  Description  Will show no signs and symptoms of excessive bleeding  Outcome: Ongoing  Note:   Patient's hemoglobin this AM:   Recent Labs     11/29/17  0448   HGB 11.5*     Patient's platelet count this AM:   Recent Labs     11/29/17  0448   PLT 97*    Thrombocytopenia Precautions in place.  Patient showing no signs or  symptoms of active bleeding.  Transfusion not indicated at this time.  Patient verbalizes understanding of all instructions. Will continue to assess and implement POC. Call light within reach and hourly rounding in place.         Problem: Pain:  Goal: Pain level will decrease  Description  Pain level will decrease  Outcome: Ongoing  Note:   Pt reports pain of 6 out of 10.  Pt states pain is located in his back. Pt currently on PRN PO Percocet.  Educated pt on use of PO Percocet for pain control.  R: Pt verbalized understanding of education.

## 2017-11-29 NOTE — Plan of Care (Signed)
Problem: Falls - Risk of:  Goal: Absence of physical injury  Description  Absence of physical injury  Note:   Orthostatic vital signs obtained at start of shift - see flowsheet for details.  Pt does not meet criteria for orthostasis.  Pt is a Med fall risk. See Lattie Corns Fall Score and ABCDS Injury Risk assessments. Pt bed is in low position, side rails up, call light and belongings are in reach.  Fall risk light is on outside pts room.  Pt encouraged to call for assistance as needed. Will continue with hourly rounds for PO intake, pain needs, toileting and repositioning as needed.         Problem: Infection - Central Venous Catheter-Associated Bloodstream Infection:  Goal: Will show no infection signs and symptoms  Description  Will show no infection signs and symptoms  11/29/2017 0944 by Ermalinda Memos, RN  Note:   CVC site remains free of signs/symptoms of infection. No drainage, edema, erythema, pain, or warmth noted at site. Dressing changes continue per protocol and on an as needed basis - see flowsheet. Performed CHG bath today per [facility] protocol utilizing CHG solution in the shower.  CVC site cleansed with CHG wipe over dressing, skin surrounding dressing, and first 6" of IV tubing.  Pt tolerated well.  Continued to encourage daily CHG bathing per [facility] protocol.         Problem: Bleeding:  Goal: Will show no signs and symptoms of excessive bleeding  Description  Will show no signs and symptoms of excessive bleeding  11/29/2017 0944 by Ermalinda Memos, RN  Note:   Patient's hemoglobin this AM:   Recent Labs     11/29/17  0448   HGB 11.5*     Patient's platelet count this AM:   Recent Labs     11/29/17  0448   PLT 97*    Thrombocytopenia Precautions in place.  Patient showing no signs or symptoms of active bleeding.  Transfusion not indicated at this time.  Patient verbalizes understanding of all instructions. Will continue to assess and implement POC. Call light within reach and hourly rounding in place.          Problem: Pain:  Goal: Control of chronic pain  Description  Control of chronic pain  Note:   Pt has no c/o pain this shift.

## 2017-11-29 NOTE — Care Coordination-Inpatient (Signed)
Discharge: dmitted with COPD exacerbation.  Home today on Prednisone, will follow up on Friday, 4/5 for hospital follow up & will receive Velcade at this appointment. David Watkins

## 2017-11-29 NOTE — Care Coordination-Inpatient (Signed)
Patient COPD Discharge Checklist     ? Is this patient a 30-day Readmission? If so, why? No    Respiratory Therapy    ? Home Equipment /Oxygen needs identified or ruled out? No    Pharmacy     ? Have the medications been reconciled?Yes           ? Can they afford their meds? Yes             ? If not is there a plan in place to ensure the patient has all their medications prior to discharge?Yes     ? Has the patient been educated on their medications?Yes      Care Coordination    ? Does the patient have a Pulmonologist?Yes    ? Has the patient/caregivers been educated to everything they need to know regarding care at home and COPD Action Plan?Yes    ? Does the patient have a primary care provider?Yes    ? Does the patient currently smoke?No      Discharge     ? Have the patient and family agreed to the discharge plan and destination?Yes     ? Are all post-discharge arrangements in place? Contacts of agencies provided? Yes     ? Does the patient have a follow-up appointment with their Primary care and/or specialist within 7 to 10 days after discharge?Yes     ? Does the patient have a written plan outlining the follow-up appointment?Yes     ? Does the patient have transportation to the appointment?Yes       Electronically signed by Berton Lan, RN on 11/29/2017 at 10:55 AM

## 2017-11-29 NOTE — Oncology Nurse Navigation (Signed)
Reviewed discharge instructions with patient.  Reviewed discharge medications including dosing, schedule, indication, and adverse reactions.  Reviewed which medications were already taken today and next dosage due for each medication.      Reviewed signs and symptoms that prompt a call to the physician and appropriate phone numbers. .  Reviewed follow up appointments that have been made in [facility] and Outpatient Oncology. Activity restrictions, and increased risk of infection were reviewed.     Patient verbalized understanding of all instructions and questions were answered to his. satisfaction.  Signed discharge instructions were given to the patient and a copy placed in the paper-lite chart.  Patient discharged to home per self.    David Watkins

## 2017-11-29 NOTE — Discharge Summary (Signed)
[facility] Discharge Summary             Attending Physician: Harlene Salts, MD    Referring MD: No referring provider defined for this encounter.    Name: David Watkins DOB:  1956-04-13  MRN:  [id number]    Admission: 11/26/2017   Discharge:  11/29/17     Date: 11/29/2017    Diagnosis on admit: Acute COPD Exacerbation    Procedures: Chest x-ray, CTA, laboratories, EKG, IV fluid hydration, Panculture for fevers    Consultations:   1. Pulmonology: Dr. Dorann Lodge    Medications:    David Watkins   Home Medication Instructions JSH:[id number]    Printed on:11/29/17 1216   Medication Information                      albuterol sulfate HFA (PROAIR HFA) 108 (90 Base) MCG/ACT inhaler  Inhale 2 puffs into the lungs every 6 hours as needed for Wheezing             atorvastatin (LIPITOR) 10 MG tablet  Take 10 mg by mouth daily             Calcium 200 MG TABS  Take 400 mg by mouth 2 times daily             diphenhydrAMINE (BENADRYL) 25 MG tablet  Take 1 tablet by mouth nightly as needed for Sleep             gabapentin (NEURONTIN) 300 MG capsule  Take 1 capsule by mouth 3 times daily for 30 days.Marland Kitchen             guaiFENesin-codeine (GUAIFENESIN AC) 100-10 MG/5ML liquid  Take 5 mLs by mouth as needed for Cough for up to 7 days.             levofloxacin (LEVAQUIN) 750 MG tablet  Take 1 tablet by mouth daily for 4 days             ondansetron (ZOFRAN) 4 MG tablet  Take 1 tablet by mouth every 6 hours as needed for Nausea or Vomiting             oxyCODONE-acetaminophen (PERCOCET) 7.5-325 MG per tablet  Take 1 tablet by mouth every 4 hours as needed for Pain..             pantoprazole (PROTONIX) 40 MG tablet  Take 1 tablet by mouth every morning (before breakfast)             predniSONE (DELTASONE) 10 MG tablet  Take 4 tablets by mouth daily             prochlorperazine (COMPAZINE) 10 MG tablet  Take 1 tablet by mouth every 6 hours as needed (Nausea)             sertraline (ZOLOFT) 50 MG tablet  Take 50 mg by mouth daily             traZODone  (DESYREL) 50 MG tablet  Take 50 mg by mouth nightly             valACYclovir (VALTREX) 500 MG tablet  Take 500 mg by mouth 2 times daily                 Reason for Admission: COPD Exacerbation    Hospital Course:   David Watkins is a 62 yo male pt of Dr. 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 He is most recently been treated with biweekly velcade + monthly daratumumab for his relapsed MM. He was being seen in the office for routine appt 11/26/17 where he was noted to be acutely SOB, tachypneic, and with audible wheezing c/w COPD exacerbation. He has recently been recovering from influenza (10/12/17) and was being followed by Dr. Dorann Lodge as outpt on steroids (prednisone 12m BID). With acute respiratory distress, pt was sent to the ED & decision was made to admit for further evaluation.     Pt underwent CXR & CTA 3/29 - both revealed no acute infiltrative/consolidative process. CTA was negative for PE and had resolution of previous tree-in-bud opacity. He does cont to have emphysematous changes c/w his diagnosis of COPD. He was then continued on PO prednisone 562mdaily & PO levaquin 75030maily. He was also started on codeine cough syrup for sx mgmt. Today pt is feeling well and states his cough & SOB is much improved. He will therefore be discharged home today with f/u scheduled for this Friday, 12/03/17. He will also see Dr. BloDorann Lodge the next week for f/u. We will cont PO levaquin & prednisone 30m32mily through f/u appt on Friday. If he is feeling well and breathing cont to improve, will start rapid steroid taper at Friday appt. He will also be due for velcade as well 4/5.    Physical Exam:     Vital Signs:  BP 115/70    Pulse 104    Temp 98.1 ??F (36.7 ??C) (Oral)    Resp 18    Ht 5' 9"  (1.753 m)    Wt 194 lb 6.4 oz (88.2 kg)    SpO2 97%    BMI 28.71 kg/m??     Weight:    Wt Readings from Last 3 Encounters:   11/29/17 194 lb 6.4 oz (88.2 kg)   11/18/17 203 lb (92.1 kg)    11/15/17 197 lb (89.4 kg)         General: Awake, alert and oriented.  HEENT: normocephalic, PERRL, no scleral erythema or icterus, Oral mucosa moist and intact, throat clear  NECK: supple without palpable adenopathy  BACK: Straight negative CVAT  SKIN: warm dry and intact without lesions rashes or masses  CHEST: exp wheezing in all lung fields  CV: Normal S1 S2, RRR, no MRG  ABD: NT ND normoactive BS, no palpable masses or hepatosplenomegaly  EXTREMITIES: without edema, denies calf tenderness  NEURO: CN II - XII grossly intact  CATHETER: Right IJ chest Port (06/21/17, [place])        Discharge Laboratory Data:  CBC:   Recent Labs     11/27/17  0037 11/28/17  0335 11/29/17  0448   WBC 6.7 4.1 6.3   HGB 10.5* 10.0* 11.5*   HCT 31.3* 30.3* 34.2*   MCV 96.5 97.5 96.4   PLT 97* 87* 97*     BMP/Mag:  Recent Labs     11/26/17  1534 11/27/17  0037 11/28/17  0335 11/29/17  0448   NA 140 137 141 139   K 4.4 4.7 3.7 4.5   CL 102 100 104 98*   CO2 25 24 26 25    PHOS  --   --   --  4.3   BUN 20 20 20 19    CREATININE 0.7* 0.8* 0.8* 0.7*   MG 1.90  --   --   --      LIVP:   Recent Labs  11/26/17  1534 11/29/17  0448   AST 15 15   ALT 26 26   BILIDIR <0.2 <0.2   BILITOT 0.5 0.4   ALKPHOS 67 49     Coags:   Recent Labs     11/27/17  0037 11/29/17  0448   PROTIME 12.1 12.3   INR 1.06 1.08   APTT 25.8* 28.8     Uric Acid   Recent Labs     11/29/17  0448   LABURIC 3.2*     Tacro:  No results for input(s): TACROLEV in the last 72 hours.  CMV Quant DNA by PCR:   Lab Results   Component Value Date/Time    CMVDNAQNT <2.4 04/24/2017 04:19 AM    CMVDNAQNT Not Detected 04/24/2017 04:19 AM         PROBLEM LIST: ????????   ????  IgG Kappa Multiple myeloma --> Plasma Cell Leukemia (03/2017)  Peripheral neuropathy  Depression  Hyperlipidemia  Hypertension  Insomnia  Chronic low back pain -??h/o lytic lesions as well as cord compression at T6 and T11????  ????  TREATMENT: ??????????   ??  1. Rad Tx to T5-7, T10-L3, Right Scapula - 3000 cGy - Dr. Jamas Lav  03/20/16-04/01/16  2. RVD x1 03/20/16 - discontinued d/t rash   3. Velcade/Pomalyst/Dex x3 cycles 04/23/16-06/17/16 - discontinued d/t reaction to pomalyst  4. Velcade/Dex x 1 cycle 06/26/16 (last dose of dexamethasone 07/22/16  5. High-dose melphalan followed by administration of PBSCs 2.36 x10^6 cd34cells/kg on 08/28/16  6. Maintenance Revlimid 12m daily (12/2016-04/23/17)  7. Dexamethasone 449mdaily (04/24/17)  8. DCEP   ????????????????????????Cycle #1 - 04/26/17  ????????????????????????Cycle #2 - 05/25/17 - excellent response  9. Dara/Velcade/Dex (C1D1 - 06/22/17)  - BMBx  10/18/17 - CR  Cycles 7-8 (21 day cycle)  - Dex 20 mg po D4,5.8,9,11 and 12  - Dara 16 mg/Kg D1 only with Dex 20 mg IV  - Velcade 1.3 mg/M2 D1,4,8 and 11  ??  Cycle 9 and beyond (maintenance, started 11/08/17)  Dara 16 mg/Kg Q 28 days  Dex 12 mg IV D1  Velcade Q 2 weeksWas due for velcade 11/22/17 and this was held.  ????  ????  ASSESSMENT AND PLAN:??????????   ??  1. ??IgG kappa multiple myeloma / Plasma Cell Leukemia: [place] relapse of his disease in 03/2017  - S/p 2 cycles of DCEP with good response (M spike 0.3 (10/15) from 6.7 (8/26), KLC 13.2 (10/15) from 637 (8/26) & IgG down to 917 (10/15) from 6379 (8/26)  - Cont Dara/Velcade/Dex (C1D1 - 06/22/17) - currently on maintenance cycle 9.   - BMBx ??10/18/17 (OPO, 10/18/17 - CR  ??  Cycle 9 and beyond (maintenance, started 11/08/17)  Dara 16 mg/Kg Q 28 days  Dex 12 mg IV D1  Velcade Q 2 weeks  ??  PLAN: Dara Q 4 weeks and Velcade Q2 weeks. ??He is considering an alloBMT as well in second CR if lung function is adequate??  ??  2. ??ID: Afebrile; Influenza A + 10/12/17  - Cont Valtrex prophylaxis   - Cough: Levaquin (3/29-4/5), Robitussin-AC Rx provided 11/29/17, Prednisone 4072maily through 12/03/17. If SOB/cough improved 4/5, will initiate rapid pred taper  ??  3. Heme: Mild Thrombocytopenia and anemia d/t chemotherapy  - Transfuse for Hgb < 7 and Platelets < 20K  - No transfusion today  ??  4. Metabolic: Stable renal fxn and e-lytes, steroid-induced  hyperglycemia   - S/p Mag Ox (stopped 07/20/17)  ??  5. Pulmonary: SOB & Cough POA, improved  - Pulm Nodule: Stable on repeat CT chest (04/23/17); cont to monitor. Stable on CTPA 10/08/17 & 3/29. Follow up in 6 months.   - Cont Albuterol inhaler prn & home nebulizer (Rx 10/12/17)  - CTPA 11/26/17: No PE; resolution of mild upper lobe tree-in-bud opacities; Mild emphysema. Two 3 mm LLL pulmonary nodules, stable  - Follows up with Dr. Dorann Lodge - will see in 1 week  - Cont Prednisone 40 mg PO daily. Start rapid taper if SOB/cough improved by this Friday, 12/03/17  ??  6. GI/Nutrition: Appetite and oral intake is good   - Cont low microbial diet ??  ??  7. 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  - Continue Lipitor   - Echo (07/13/16): abnormal (paradoxical) septal motion is present with preserved LVEF 55-60%.  ??  8. Anxiety/Depression: Ongoing  - Cont Zoloft and trazadone daily  - Cont Ativan PRN  - Psych to follow as needed   ??  9. Insomnia: ongoing, has failed multiple sleep aids in the past. ??No complaints during hospitalization   - Cont Trazodone nightly   ??  10. Peripheral Neuropathy: Ongoing  - Cont Gabapentin 300 mg in AM, 330m midday, 6052mnightly.  ??  11. 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  12. Chronic left lower back pain:  - Tramadol prn (RX 07/06/17)  - MRI (08/18/17): Progression of metastatic disease with multiple new vertebral lesions and new metastasis of the left T12 pedicle and transverse process with associated epidural tumor in the left half of the spinal canal   - S/p Radiation (per Dr. FrMaceo Prox 10 days (started 09/01/17, completed 09/15/17).  ??    Condition on discharge: Stable    Discharge Instructions:  [facility] assessment, labs (CBC w/diff, CMP, Mg,  Phos), & possibly treatment (Velcade).    The patient was advised on activity and dietary restrictions.    The patient was advised to follow up in the emergency department or contact the physician with any unresolved nausea/vomiting/diarrhea/pain or temperature greater than 100.5 F or any other unusual symptoms.       This discharge summary and plan was discussed and agreed upon with Dr. EsHunt Oris     BrLoma NewtonAPRN - CNP

## 2017-11-30 ENCOUNTER — Ambulatory Visit: Payer: BLUE CROSS/BLUE SHIELD | Primary: Hematology & Oncology

## 2017-12-01 LAB — PREPARE RBC (CROSSMATCH)

## 2017-12-01 LAB — CULTURE, BLOOD 2: Culture, Blood 2: NO GROWTH

## 2017-12-01 LAB — CULTURE BLOOD #1: Blood Culture, Routine: NO GROWTH

## 2017-12-06 ENCOUNTER — Ambulatory Visit
Admit: 2017-12-06 | Discharge: 2017-12-06 | Payer: BLUE CROSS/BLUE SHIELD | Attending: Pulmonary Disease | Primary: Hematology & Oncology

## 2017-12-06 ENCOUNTER — Encounter: Payer: BLUE CROSS/BLUE SHIELD | Attending: Pulmonary Disease | Primary: Hematology & Oncology

## 2017-12-06 DIAGNOSIS — J441 Chronic obstructive pulmonary disease with (acute) exacerbation: Secondary | ICD-10-CM

## 2017-12-06 MED ORDER — FLUTICASONE FUROATE-VILANTEROL 200-25 MCG/INH IN AEPB
200-25 | Freq: Every day | RESPIRATORY_TRACT | 11 refills | Status: DC
Start: 2017-12-06 — End: 2018-01-12

## 2017-12-06 NOTE — Progress Notes (Signed)
Subjective:      Patient ID: David Watkins is a 62 y.o. male.    HPI  Mr. Gatling is seen 1 week after discharge from hospitalization for an exacerbation of COPD.  He was treated with high-dose steroids, bronchodilators, levofloxacin.  At the time of discharge, he was feeling significantly improved, although still breathless with modest activity.  Cough had decreased significantly.  Since arriving home, cough has virtually resolved.  He is breathing much more comfortably, although exercise tolerance remains impaired.  No nocturnal chest symptoms.  No chest tightness or pain.  He was in the oncologist's office this morning, starting evaluation for anticipated stem cell transplant in May/June for multiple myeloma.    Review of Systems    Objective:   Physical Exam   Constitutional: He is oriented to person, place, and time. He appears well-developed and well-nourished.   Appears a little breathless walking back to the exam room from waiting room   HENT:   Head: Normocephalic and atraumatic.   Mouth/Throat: Oropharynx is clear and moist. No oropharyngeal exudate.   Eyes: Conjunctivae are normal. Right eye exhibits no discharge. No scleral icterus.   Neck: No tracheal deviation present. No thyromegaly present.   Cardiovascular: Normal rate, regular rhythm, normal heart sounds and intact distal pulses.   No murmur heard.  Pulmonary/Chest: No respiratory distress.   Breath sounds mildly reduced bilaterally, without adventitious breath sounds   Musculoskeletal: He exhibits no edema.   Lymphadenopathy:     He has no cervical adenopathy.   Neurological: He is alert and oriented to person, place, and time.   Skin: Skin is warm and dry.   Psychiatric: He has a normal mood and affect. His behavior is normal. Judgment and thought content normal.       Assessment:      Recent exacerbation of chronic bronchitis is resolving.  Prednisone dose currently at 30 mg daily.  He is using nebulized albuterol 4 times daily.  Completed course  of levofloxacin      Plan:      Starting fluticasone 200 mcg-vilanteral inhaler 1 puff daily, use nebulizer with albuterol as required.  Reduce prednisone to 20 mg daily for 5 days, then 10 mg daily for 5 days, then discontinue    Dear this face-to-face encounter of 25 minutes, spent greater than 50% of time counseling the patient on ongoing treatment of chronic airways disease, use of inhaled steroid for inflammation control, risk factors of COPD related to stem cell transplant, timing of all my function testing        Charleen Kirks, MD

## 2017-12-06 NOTE — Patient Instructions (Signed)
Reduce prednisone to 2 tabs daily x 5 days, 1 tab x 5 days, then stop  Breo inhaler one puff daily  Nebulizer with albuterol as needed.

## 2017-12-09 DIAGNOSIS — I110 Hypertensive heart disease with heart failure: Secondary | ICD-10-CM | POA: Diagnosis not present

## 2017-12-09 DIAGNOSIS — I5022 Chronic systolic (congestive) heart failure: Secondary | ICD-10-CM | POA: Diagnosis not present

## 2017-12-09 DIAGNOSIS — R079 Chest pain, unspecified: Secondary | ICD-10-CM

## 2017-12-09 DIAGNOSIS — R001 Bradycardia, unspecified: Secondary | ICD-10-CM | POA: Diagnosis not present

## 2017-12-10 DIAGNOSIS — R079 Chest pain, unspecified: Secondary | ICD-10-CM | POA: Diagnosis not present

## 2017-12-27 ENCOUNTER — Inpatient Hospital Stay: Admit: 2017-12-27 | Payer: BLUE CROSS/BLUE SHIELD | Primary: Hematology & Oncology

## 2017-12-27 DIAGNOSIS — C9000 Multiple myeloma not having achieved remission: Secondary | ICD-10-CM

## 2017-12-27 NOTE — Procedures (Signed)
[facility]               [address] [place], [place] [address]                               PULMONARY FUNCTION    PATIENT NAME: David Watkins, David Watkins                      DOB:        1956/08/14  MED REC NO:   [id number]                          ROOM:  ACCOUNT NO:   [id number]                           ADMIT DATE: 12/27/2017  PROVIDER:     Arloa Koh, MD    DATE OF PROCEDURE:  12/27/2017    Forced vital capacity mildly reduced, FEV1 severely reduced, FEV1 to FVC  ratio moderately reduced.  After inhaled bronchodilator, bladder  capacity improved, expiratory flow is unchanged.  Total lung capacity  and FRC normal, RV increased.  Single-breath diffusion capacity mildly  reduced.    IMPRESSION:  Severe obstructive ventilatory defect.  Slight improvement  seen after inhaled bronchodilator, in relief of air trapping.  Mild air  trapping is present.  Finding is consistent with severe chronic  obstructive airflow disease.        Vint Pola Karie Kirks, MD    D: 12/28/2017 10:36:26       T: 12/28/2017 10:41:08     [email or web address]  Job#: [id number]     Doc#: [id number]    CC:

## 2017-12-31 ENCOUNTER — Inpatient Hospital Stay: Payer: BLUE CROSS/BLUE SHIELD | Primary: Hematology & Oncology

## 2017-12-31 ENCOUNTER — Inpatient Hospital Stay: Admit: 2017-12-31 | Payer: BLUE CROSS/BLUE SHIELD | Primary: Hematology & Oncology

## 2017-12-31 ENCOUNTER — Encounter

## 2017-12-31 DIAGNOSIS — C9000 Multiple myeloma not having achieved remission: Secondary | ICD-10-CM

## 2018-01-10 ENCOUNTER — Ambulatory Visit
Admit: 2018-01-10 | Discharge: 2018-01-10 | Payer: BLUE CROSS/BLUE SHIELD | Attending: Pulmonary Disease | Primary: Hematology & Oncology

## 2018-01-10 DIAGNOSIS — J441 Chronic obstructive pulmonary disease with (acute) exacerbation: Secondary | ICD-10-CM

## 2018-01-10 MED ORDER — DEXAMETHASONE 4 MG PO TABS
4 MG | ORAL_TABLET | Freq: Every day | ORAL | 3 refills | Status: DC
Start: 2018-01-10 — End: 2018-01-12

## 2018-01-10 NOTE — Progress Notes (Signed)
Subjective:      Patient ID: David Watkins is a 62 y.o. male.    HPI  David Watkins returns for follow-up for COPD, review of recent PFT in anticipation of upcoming stem cell transplant.  He had been doing well until the past week.  He is again having severe shortness of breath even walking distances of 20 feet.  Cough is productive of mucoid sputum, clearing secretions without difficulty.  No fevers chills or sweats.  Notably, he had been traveling by plane several days prior to onset of this apparent exacerbation.  He has continued breo inhaler, and is using his nebulizers with albuterol at least 4 times daily.  He gets relief with use of the nebulizer, although he doesn't feel it is as effective as the nebulizer treatments he received when he was hospitalized recently.  He has also been on Decadron 4 mg daily because of back pain from bone lesions, related to multiple myeloma.    Review of Systems    Objective:   Physical Exam   Constitutional: He is oriented to person, place, and time. Vital signs are normal. He appears well-developed and well-nourished.   He appears very uncomfortable, changes position with difficulty because of pain   HENT:   Head: Normocephalic and atraumatic.   Mouth/Throat: Oropharynx is clear and moist. No oropharyngeal exudate.   Eyes: Conjunctivae are normal. Right eye exhibits no discharge. No scleral icterus.   Neck: No tracheal deviation present. No thyromegaly present.   Cardiovascular: Normal rate, regular rhythm, normal heart sounds and intact distal pulses.   No murmur heard.  Pulmonary/Chest:   Rest sounds moderately reduced throughout, with faint high-pitched expiratory wheezes in mid lung zones.  A few bibasilar medium crackles   Musculoskeletal: He exhibits no edema.   Lymphadenopathy:     He has no cervical adenopathy.   Neurological: He is alert and oriented to person, place, and time.   Skin: Skin is warm and dry.   Psychiatric: He has a normal mood and affect. His behavior is  normal. Judgment and thought content normal.     PFT on 12/27/17 is reviewed, compared to November 2017: FVC 3.25 L, 66%, and FEV1 1.42 L, 38%.  FEV1/FVC is 44%.  This is unchanged from prior study.  Air trapping seen on lung volumes.  Diffusion capacity 3.91, 79% of predicted, down from 5.21    Assessment:      Exacerbation of COPD may be related to viral respiratory infection, perhaps acquired during recent air travel.  Multiple myeloma, awaiting stem cell transplant.  Current pulmonary function study indicates a decrease in diffusion opacity which may delay getting this done.      Plan:      Increase Decadron to 12 mg daily, equivalent to 60 mg of prednisolone.  Replaced breo with Trelegy inhaler daily.  Follow-up in one week        Charleen Kirks, MD

## 2018-01-10 NOTE — Patient Instructions (Signed)
Increase decadron to 4 mg three times daily.    Trelegy inhaler one puff daily, instead of Breo  Use nebulizer as needed.

## 2018-01-12 ENCOUNTER — Inpatient Hospital Stay
Admit: 2018-01-12 | Discharge: 2018-01-18 | Disposition: A | Discharge status: Home Health | Payer: BLUE CROSS/BLUE SHIELD | Source: Ambulatory Visit | Admitting: Hematology

## 2018-01-12 DIAGNOSIS — Z5111 Encounter for antineoplastic chemotherapy: Principal | ICD-10-CM

## 2018-01-12 LAB — CBC WITH AUTO DIFFERENTIAL
Basophils %: 0.1 %
Basophils Absolute: 0 10*3/uL (ref 0.0–0.2)
Eosinophils %: 0 %
Eosinophils Absolute: 0 10*3/uL (ref 0.0–0.6)
Hematocrit: 34.3 % — ABNORMAL LOW (ref 40.5–52.5)
Hemoglobin: 10.9 g/dL — ABNORMAL LOW (ref 13.5–17.5)
Lymphocytes %: 4.4 %
Lymphocytes Absolute: 0.5 10*3/uL — ABNORMAL LOW (ref 1.0–5.1)
MCH: 30.1 pg (ref 26.0–34.0)
MCHC: 31.7 g/dL (ref 31.0–36.0)
MCV: 95 fL (ref 80.0–100.0)
MPV: 7.3 fL (ref 5.0–10.5)
Monocytes %: 9 %
Monocytes Absolute: 1 10*3/uL (ref 0.0–1.3)
Neutrophils %: 86.5 %
Neutrophils Absolute: 9.9 10*3/uL — ABNORMAL HIGH (ref 1.7–7.7)
Platelets: 125 10*3/uL — ABNORMAL LOW (ref 135–450)
RBC: 3.62 M/uL — ABNORMAL LOW (ref 4.20–5.90)
RDW: 18.7 % — ABNORMAL HIGH (ref 12.4–15.4)
WBC: 11.5 10*3/uL — ABNORMAL HIGH (ref 4.0–11.0)

## 2018-01-12 LAB — BASIC METABOLIC PANEL W/ REFLEX TO MG FOR LOW K
Anion Gap: 12 (ref 3–16)
BUN: 32 mg/dL — ABNORMAL HIGH (ref 7–20)
CO2: 24 mmol/L (ref 21–32)
Calcium: 9.9 mg/dL (ref 8.3–10.6)
Chloride: 98 mmol/L — ABNORMAL LOW (ref 99–110)
Creatinine: 0.8 mg/dL — ABNORMAL LOW (ref 0.9–1.3)
GFR African American: >60 (ref >60)
GFR Non-African American: >60 (ref >60)
Glucose: 131 mg/dL — ABNORMAL HIGH (ref 70–99)
Potassium reflex Magnesium: 4.8 mmol/L (ref 3.5–5.1)
Sodium: 134 mmol/L — ABNORMAL LOW (ref 136–145)

## 2018-01-12 LAB — PROTIME-INR
INR: 1.11 (ref 0.86–1.14)
Protime: 12.7 s (ref 9.8–13.0)

## 2018-01-12 LAB — BASIC METABOLIC PANEL REFLEX MG
Anion Gap: 12 (ref 3–16)
BUN: 32 mg/dL — ABNORMAL HIGH (ref 7–20)
CO2: 24 mmol/L (ref 21–32)
Calcium: 9.9 mg/dL (ref 8.3–10.6)
Chloride: 98 mmol/L — ABNORMAL LOW (ref 99–110)
Creatinine: 0.8 mg/dL — ABNORMAL LOW (ref 0.9–1.3)
Glucose: 131 mg/dL — ABNORMAL HIGH (ref 70–99)
POC EGFR MDRD Non Af American: >60 (ref >60)
POC GFR MDRD Af Amer: >60 (ref >60)
Potassium: 4.8 mmol/L (ref 3.5–5.1)
Sodium: 134 mmol/L — ABNORMAL LOW (ref 136–145)

## 2018-01-12 LAB — CBC WITH PLATELET AND DIFFERENTIAL
Basophils Absolute: 0 K/uL (ref 0.0–0.2)
Basophils Relative: 0.1 %
Eosinophils Absolute: 0 K/uL (ref 0.0–0.6)
Eosinophils Relative: 0 %
Hematocrit: 34.3 % — ABNORMAL LOW (ref 40.5–52.5)
Hemoglobin: 10.9 g/dL — ABNORMAL LOW (ref 13.5–17.5)
Lymphocytes Absolute: 0.5 K/uL — ABNORMAL LOW (ref 1.0–5.1)
Lymphocytes Relative: 4.4 %
MCH: 30.1 pg (ref 26.0–34.0)
MCHC: 31.7 g/dL (ref 31.0–36.0)
MCV: 95 fL (ref 80.0–100.0)
MPV: 7.3 fL (ref 5.0–10.5)
Monocytes Absolute: 1 K/uL (ref 0.0–1.3)
Monocytes Relative: 9 %
Neutrophils Absolute: 9.9 K/uL — ABNORMAL HIGH (ref 1.7–7.7)
Neutrophils Relative: 86.5 %
Platelets: 125 K/uL — ABNORMAL LOW (ref 135–450)
RBC: 3.62 M/uL — ABNORMAL LOW (ref 4.20–5.90)
RDW: 18.7 % — ABNORMAL HIGH (ref 12.4–15.4)
WBC: 11.5 K/uL — ABNORMAL HIGH (ref 4.0–11.0)

## 2018-01-12 LAB — PROTHROMBIN TIME
INR: 1.11 (ref 0.86–1.14)
Protime: 12.7 s (ref 9.8–13.0)

## 2018-01-12 MED ORDER — OYSTER SHELL CALCIUM 500 MG PO TABS
500 MG | Freq: Two times a day (BID) | ORAL | Status: DC
Start: 2018-01-12 — End: 2018-01-12

## 2018-01-12 MED ORDER — GABAPENTIN 300 MG PO CAPS
300 MG | Freq: Three times a day (TID) | ORAL | Status: DC
Start: 2018-01-12 — End: 2018-01-18
  Administered 2018-01-13 – 2018-01-18 (×17): 300 mg via ORAL

## 2018-01-12 MED ORDER — PROCHLORPERAZINE MALEATE 10 MG PO TABS
10 MG | Freq: Four times a day (QID) | ORAL | Status: DC | PRN
Start: 2018-01-12 — End: 2018-01-13

## 2018-01-12 MED ORDER — OYSTER SHELL CALCIUM 500 MG PO TABS
500 MG | Freq: Two times a day (BID) | ORAL | Status: DC
Start: 2018-01-12 — End: 2018-01-18
  Administered 2018-01-13 – 2018-01-18 (×12): 500 mg via ORAL

## 2018-01-12 MED ORDER — MORPHINE SULFATE 4 MG/ML IJ SOLN
4 MG/ML | Freq: Once | INTRAMUSCULAR | Status: AC
Start: 2018-01-12 — End: 2018-01-12
  Administered 2018-01-12: 20:00:00 4 mg via INTRAVENOUS

## 2018-01-12 MED ORDER — SALINE MOUTHWASH
Status: DC | PRN
Start: 2018-01-12 — End: 2018-01-18

## 2018-01-12 MED ORDER — SALINE MOUTHWASH
Freq: Four times a day (QID) | Status: DC
Start: 2018-01-12 — End: 2018-01-18
  Administered 2018-01-13 – 2018-01-18 (×16): 15 mL via ORAL

## 2018-01-12 MED ORDER — ALBUTEROL SULFATE HFA 108 (90 BASE) MCG/ACT IN AERS
108 (90 Base) MCG/ACT | Freq: Four times a day (QID) | RESPIRATORY_TRACT | Status: DC | PRN
Start: 2018-01-12 — End: 2018-01-12

## 2018-01-12 MED ORDER — PANTOPRAZOLE SODIUM 40 MG PO TBEC
40 MG | Freq: Every day | ORAL | Status: DC
Start: 2018-01-12 — End: 2018-01-18
  Administered 2018-01-13 – 2018-01-18 (×6): 40 mg via ORAL

## 2018-01-12 MED ORDER — SODIUM CHLORIDE 0.9 % IV SOLN
0.9 % | INTRAVENOUS | Status: DC | PRN
Start: 2018-01-12 — End: 2018-01-18

## 2018-01-12 MED ORDER — LORAZEPAM 0.5 MG PO TABS
0.5 MG | Freq: Every evening | ORAL | Status: DC | PRN
Start: 2018-01-12 — End: 2018-01-18
  Administered 2018-01-13 – 2018-01-16 (×3): 0.5 mg via ORAL

## 2018-01-12 MED ORDER — FLUTICASONE FUROATE-VILANTEROL 200-25 MCG/INH IN AEPB
200-25 MCG/INH | Freq: Every day | RESPIRATORY_TRACT | Status: DC
Start: 2018-01-12 — End: 2018-01-12

## 2018-01-12 MED ORDER — DEXAMETHASONE 4 MG PO TABS
4 MG | Freq: Three times a day (TID) | ORAL | Status: DC
Start: 2018-01-12 — End: 2018-01-13
  Administered 2018-01-13 (×2): 4 mg via ORAL

## 2018-01-12 MED ORDER — ONDANSETRON HCL 4 MG PO TABS
4 MG | Freq: Four times a day (QID) | ORAL | Status: DC | PRN
Start: 2018-01-12 — End: 2018-01-18

## 2018-01-12 MED ORDER — BUDESONIDE 0.5 MG/2ML IN SUSP
0.5 MG/2ML | Freq: Two times a day (BID) | RESPIRATORY_TRACT | Status: DC
Start: 2018-01-12 — End: 2018-01-18
  Administered 2018-01-13 – 2018-01-18 (×12): 500 mg via RESPIRATORY_TRACT

## 2018-01-12 MED ORDER — DEXAMETHASONE 4 MG PO TABS
4 MG | Freq: Every day | ORAL | Status: DC
Start: 2018-01-12 — End: 2018-01-12

## 2018-01-12 MED ORDER — MAGNESIUM SULFATE 4000 MG/100 ML IVPB PREMIX
4 GM/100ML | INTRAVENOUS | Status: DC | PRN
Start: 2018-01-12 — End: 2018-01-13

## 2018-01-12 MED ORDER — NORMAL SALINE FLUSH 0.9 % IV SOLN
0.9 % | Freq: Two times a day (BID) | INTRAVENOUS | Status: DC
Start: 2018-01-12 — End: 2018-01-18
  Administered 2018-01-13 – 2018-01-18 (×12): 10 mL via INTRAVENOUS

## 2018-01-12 MED ORDER — MAGNESIUM HYDROXIDE 400 MG/5ML PO SUSP
400 MG/5ML | Freq: Every day | ORAL | Status: DC | PRN
Start: 2018-01-12 — End: 2018-01-18
  Administered 2018-01-16 – 2018-01-17 (×2): 30 mL via ORAL

## 2018-01-12 MED ORDER — ALTEPLASE 2 MG IJ SOLR
2 MG | INTRAMUSCULAR | Status: DC | PRN
Start: 2018-01-12 — End: 2018-01-18

## 2018-01-12 MED ORDER — ATORVASTATIN CALCIUM 20 MG PO TABS
20 MG | Freq: Every evening | ORAL | Status: DC
Start: 2018-01-12 — End: 2018-01-13
  Administered 2018-01-13: 10 mg via ORAL

## 2018-01-12 MED ORDER — FORMOTEROL FUMARATE 20 MCG/2ML IN NEBU
20 MCG/2ML | Freq: Two times a day (BID) | RESPIRATORY_TRACT | Status: DC
Start: 2018-01-12 — End: 2018-01-18
  Administered 2018-01-13 – 2018-01-18 (×12): 20 ug via RESPIRATORY_TRACT

## 2018-01-12 MED ORDER — VALACYCLOVIR HCL 500 MG PO TABS
500 MG | Freq: Two times a day (BID) | ORAL | Status: DC
Start: 2018-01-12 — End: 2018-01-18
  Administered 2018-01-13 – 2018-01-18 (×12): 500 mg via ORAL

## 2018-01-12 MED ORDER — OXYCODONE-ACETAMINOPHEN 7.5-325 MG PO TABS
ORAL | Status: DC | PRN
Start: 2018-01-12 — End: 2018-01-13
  Administered 2018-01-12 – 2018-01-13 (×3): 1 via ORAL

## 2018-01-12 MED ORDER — FENTANYL 25 MCG/HR TD PT72
25 MCG/HR | TRANSDERMAL | Status: DC
Start: 2018-01-12 — End: 2018-01-13
  Administered 2018-01-13: 1 via TRANSDERMAL

## 2018-01-12 MED ORDER — TIOTROPIUM BROMIDE MONOHYDRATE 18 MCG IN CAPS
18 MCG | Freq: Every day | RESPIRATORY_TRACT | Status: DC
Start: 2018-01-12 — End: 2018-01-18
  Administered 2018-01-13 – 2018-01-18 (×6): 18 ug via RESPIRATORY_TRACT

## 2018-01-12 MED ORDER — NORMAL SALINE FLUSH 0.9 % IV SOLN
0.9 % | INTRAVENOUS | Status: DC | PRN
Start: 2018-01-12 — End: 2018-01-18

## 2018-01-12 MED ORDER — TRAZODONE HCL 50 MG PO TABS
50 MG | Freq: Every evening | ORAL | Status: DC
Start: 2018-01-12 — End: 2018-01-12

## 2018-01-12 MED ORDER — POTASSIUM CHLORIDE 20 MEQ/50ML IV SOLN
20 MEQ/50ML | INTRAVENOUS | Status: DC | PRN
Start: 2018-01-12 — End: 2018-01-18

## 2018-01-12 MED ORDER — ALBUTEROL SULFATE (2.5 MG/3ML) 0.083% IN NEBU
Freq: Four times a day (QID) | RESPIRATORY_TRACT | Status: DC | PRN
Start: 2018-01-12 — End: 2018-01-14
  Administered 2018-01-13: 01:00:00 2.5 mg via RESPIRATORY_TRACT

## 2018-01-12 MED ORDER — SERTRALINE HCL 50 MG PO TABS
50 MG | Freq: Every day | ORAL | Status: DC
Start: 2018-01-12 — End: 2018-01-18
  Administered 2018-01-13 – 2018-01-18 (×6): 50 mg via ORAL

## 2018-01-12 MED ORDER — MORPHINE SULFATE (PF) 2 MG/ML IV SOLN
2 MG/ML | INTRAVENOUS | Status: DC | PRN
Start: 2018-01-12 — End: 2018-01-13
  Administered 2018-01-13 (×3): 2 mg via INTRAVENOUS

## 2018-01-12 MED FILL — OYSTER SHELL CALCIUM 500 MG PO TABS: 500 mg | ORAL | Qty: 1

## 2018-01-12 MED FILL — OXYCODONE-ACETAMINOPHEN 7.5-325 MG PO TABS: 7.5-325 mg | ORAL | Qty: 1

## 2018-01-12 MED FILL — MORPHINE SULFATE 4 MG/ML IJ SOLN: 4 mg/mL | INTRAMUSCULAR | Qty: 1

## 2018-01-12 NOTE — Unmapped (Signed)
Your patient was seen at a [facility]. Please go to [email or web address] to view information filed to your patient's chart in Epic.  If you need to view your patient's results prior to gaining access to   Epic CareLink, please contact the [facility] where your patient was seen.             [name], Space #[id number] ([phone]) [phone] y.o. M) PCPHortense Ramal [phone])   A12-12           ED Arrival Information     Expected Arrival Acuity Means of Arrival Escorted By Service Admission Type    - 01/12/2018  3:20 PM 3-Urgent [facility] Member Med/Surg Emergency      Arrival Complaint    severe pain and numbness          Chief Complaint     Complaint Comment    Back Pain severe Back pain,  Pet scan on 01/10/18 shower new progression Myloma on spine        ED Vitals    Date/Time Temp Pulse Resp BP SpO2 Weight Who   01/12/18 1833 -- 83 18 130/87 95 % -- CJ   01/12/18 1649 -- 81 18 136/93 94 % -- CJ   01/12/18 1628 -- 87 18 125/95 93 % -- CJ   01/12/18 1537 97.4 ????F (36.3 ????C) 85 18 -- 95 % 195 lb (88.5 kg) CJ          Allergies (Review Complete on: 01/12/18)     Agent Severity Comments    Lenalidomide High     Pomalidomide      Ceclor [Cefaclor] Low           Medical History     Past Medical History      Date Comments   Hyperlipidemia [E78.5]     Low back pain [M54.5]     Neuropathy [G62.9]  chemo induced, feet   Bone pain [M89.8X9]     Multiple myeloma (HCC) [C90.00]     Leukemia, plasma cell, 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]            Surgical History     Past Surgical History     Laterality Last Occurrence Comments  BONE MARROW BIOPSY [FAO130]     OTHER SURGICAL HISTORY [SHX169] Left 08/17/2016 trifusion cath placement  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 male with a history of multiple myeloma with known significant spinal lesions, who presents with uncontrolled pain, with a recent PET scan that shows an increase in the number of lesions, as well as a large left mass.    Patient's has been taking Percocet and has a fentanyl patch for pain control.  He  states that he always is in pain, but is generally functional, and in fact works 5 days a week.  He has had some right low back to gluteal pain that seems to radiate down   the medial aspect of the right leg, as well as some weakness in his bilateral legs, somewhat worse on the left than the right, which has been present for some months, but does seem worse in the last couple of weeks.  He denies any changes in sensation.    He denies any difficulty with bladder or bowel movements or saddle anesthesia.  He denies any significant nausea or vomiting.  He describes that his worst pain is in the left lateral thoracoabdominal area, approximately in the posterior axillary line,   from the lower left ribs through the area of the iliac wing.  He is very tender to even light palpation in that area, and he believes that corresponds to the area of the left-sided mass that was seen on PET.  Patient states that he was called by his   oncology office and told to pack a bag and go to the emergency room.  He is somewhat worried about these instructions in the setting of his recent PET.  The patient describes his pain as being 10 out of 10 in intensity, aching in character, throughout   the back, but particularly in the left thoracoabdominal area as described above.  He states that this has been present for some weeks, but was  particularly worse last night.    Review of Systems     Review of Systems   All other systems reviewed and are negative.      Past Medical, Surgical, Family, and Social History     He has a past medical history of Bone pain, Hyperlipidemia, Leukemia, plasma cell, in relapse (HCC), Low back pain, Multiple myeloma (HCC), and Neuropathy.  He has a past surgical history that includes bone marrow biopsy; other surgical history (Left, 08/17/2016); bone marrow transplant; and pre-malignant / benign skin lesion excision (Left, 03/29/2017).  His family history includes Heart Attack in his brother; Heart Disease in his mother; Lung Cancer in his father.  He reports that he quit smoking about 5 years ago. He has never used smokeless tobacco. He reports that he does not drink alcohol or use drugs.    Medications     Previous Medications    ALBUTEROL SULFATE HFA (PROAIR HFA) 108 (90 BASE) MCG/ACT INHALER    Inhale 2 puffs into the lungs every 6 hours as needed for Wheezing    ATORVASTATIN (LIPITOR) 10 MG TABLET    Take 10 mg by mouth nightly     CALCIUM CARBONATE (OSCAL) 500 MG TABS TABLET    Take 500 mg by mouth 2 times daily     DEXAMETHASONE (DECADRON) 4 MG TABLET    Take 4 mg by mouth 3 times daily    FENTANYL (DURAGESIC) 25 MCG/HR    Place 1 patch onto the skin every 72 hours.    FLUTICASONE-UMECLIDIN-VILANT (TRELEGY ELLIPTA) 100-62.5-25 MCG/INH AEPB    Inhale 1 puff into the lungs daily    GABAPENTIN (NEURONTIN) 300 MG CAPSULE    Take 1 capsule by mouth 3 times daily for 30 days.Marland Kitchen    LIDOCAINE-PRILOCAINE (EMLA) 2.5-2.5 % CREAM    Apply topically as needed for Pain Apply topically as needed.    ONDANSETRON (ZOFRAN) 4 MG TABLET    Take 1 tablet by mouth every 6 hours as needed  for Nausea or Vomiting    OXYCODONE-ACETAMINOPHEN (PERCOCET) 7.5-325 MG PER TABLET    Take 1 tablet by mouth every 4 hours as needed for Pain.Marland Kitchen    PANTOPRAZOLE (PROTONIX) 40 MG TABLET    Take 1 tablet by mouth every morning (before  breakfast)    PROCHLORPERAZINE (COMPAZINE) 10 MG TABLET    Take 1 tablet by mouth every 6 hours as needed (Nausea)    SERTRALINE (ZOLOFT) 50 MG TABLET    Take 50 mg by mouth daily    VALACYCLOVIR (VALTREX) 500 MG TABLET    Take 500 mg by mouth 2 times daily       Allergies     He is allergic to lenalidomide; pomalidomide; and ceclor [cefaclor].    Physical Exam     INITIAL VITALS: BP: (!) 125/95, Temp: 97.4 ????F (36.3 ????C), Pulse: 85, Resp: 18, SpO2: 95 %     General: Well appearing.  Uncomfortable appearing, but in NAD.    HEENT: Pupils are equal, round, and reactive to light. Extraocular muscles are intact.  Conjunctivae are clear and moist. No redness or drainage from the eyes.     Neck: Supple, with full range of motion.    Back: No focal midline T or L spine tenderness to palpation, crepitus, or step-offs.   There is generalized tenderness to palpation in the left lateral back in approximately the posterior axillary line, from the lower rib cage through the iliac crest, where the patient is tender even to mild palpation.    Chest: Not tender to palpation.    Cardiovascular: Normal S1-S2 without murmur rub or gallop. 2+ radial pulses bilaterally. 2+ DP pulses bilaterally.    Respiratory: Unlabored breathing with equal chest rise and fall. Lungs are clear to auscultation bilaterally. No adventitious lung sounds heard.    Abdomen: Soft and nontender, without guarding or rebound tenderness. No masses or hepatosplenomegaly.    Skin: Warm and dry, without rashes or ecchymoses, lacerations or abrasions.     Neuro: Alert and oriented x3.  Patient has full strength and dorsiflexion and plantar flexion at the ankles.  He has some decreased strength in resisted flexion at the hips bilaterally, slightly worse on the left than the right.  He has slightly   decreased strength to resisted flexion at the elbow on the left, otherwise good strength throughout.    Extremities: Warm and well-perfused, without clubbing, cyanosis, or  edema. The patient moves all extremities equally.    Psych: The patient's mood and affect are generally within normal limits for their presentation.      Diagnostic Results       RADIOLOGY:  No orders to display       LABS:   Results for orders placed or performed during the hospital encounter of 01/12/18   CBC auto differential   Result Value Ref Range    WBC 11.5 (H) 4.0 - 11.0 K/uL    RBC 3.62 (L) 4.20 - 5.90 M/uL    Hemoglobin 10.9 (L) 13.5 - 17.5 g/dL    Hematocrit 45.4 (L) 40.5 - 52.5 %    MCV 95.0 80.0 - 100.0 fL    MCH 30.1 26.0 - 34.0 pg    MCHC 31.7 31.0 - 36.0 g/dL    RDW 09.8 (H) 11.9 - 15.4 %    Platelets 125 (L) 135 - 450 K/uL    MPV 7.3 5.0 - 10.5 fL    Neutrophils % 86.5 %    Lymphocytes % 4.4 %  Monocytes % 9.0 %    Eosinophils % 0.0 %    Basophils % 0.1 %    Neutrophils # 9.9 (H) 1.7 - 7.7 K/uL    Lymphocytes # 0.5 (L) 1.0 - 5.1 K/uL    Monocytes # 1.0 0.0 - 1.3 K/uL    Eosinophils # 0.0 0.0 - 0.6 K/uL    Basophils # 0.0 0.0 - 0.2 K/uL   PT - INR   Result Value Ref Range    Protime 12.7 9.8 - 13.0 sec    INR 1.11 0.86 - 1.14   Basic Metabolic Panel w/ Reflex to MG   Result Value Ref Range    Sodium 134 (L) 136 - 145 mmol/L    Potassium reflex Magnesium 4.8 3.5 - 5.1 mmol/L    Chloride 98 (L) 99 - 110 mmol/L    CO2 24 21 - 32 mmol/L    Anion Gap 12 3 - 16    Glucose 131 (H) 70 - 99 mg/dL    BUN 32 (H) 7 - 20 mg/dL    CREATININE 0.8 (L) 0.9 - 1.3 mg/dL    GFR Non-African American >60 >60    GFR African American >60 >60    Calcium 9.9 8.3 - 10.6 mg/dL       RECENT VITALS:  BP: 130/87, Temp: 97.4 ????F (36.3 ????C), Pulse: 83, Resp: 18, SpO2: 95 %     Procedures       ED Course     Nursing Notes, Past Medical Hx, Past Surgical Hx, Social Hx, Allergies, and Family Hx were reviewed.    The patient was given the following medications:  Orders Placed This Encounter   Medications   ??????? morphine injection 4 mg       CONSULTS:  IP CONSULT TO ONCOLOGY    MEDICAL DECISION MAKING / ASSESSMENT / PLAN     Derek Benson is a 62 y.o. male with an unfortunate history of multiple myeloma, with a PET scan which shows progression of the disease, and pain which is poorly controlled at home.  The patient was called by his oncology team and told to come to the   emergency department, presumably for admission.  His symptoms have been gradually progressive over recent weeks, but are not markedly different today than previously, although his pain was especially poorly controlled last night.  He has some weakness in   his bilateral lower extremities, somewhat worse on the left than the right, and some weakness in his left upper extremity.  However, the patient states that all of this weakness has been present for some time, and is not new.  He does not have any   findings concerning for cauda equina or other spinal surgical emergency.  He was given a dose of IV morphine in the emergency department, which improved his pain somewhat.  The patient's case was discussed with Dr. Gladstone Lighter, who has placed admission orders   for the patient.      Clinical Impression     1. Multiple myeloma not having achieved remission (HCC)    2. Back pain of thoracolumbar region    3. Mass of spine        Disposition     PATIENT REFERRED TO:  Emilee Hero, MD  [address]  [place] [address]  [phone]          Leola Brazil, MD  [address]  [place]  [address]  [phone]            DISCHARGE MEDICATIONS:  New Prescriptions    No medications on file       DISPOSITION Admitted    (Please note that portions of this note were completed with voice recognition software.  Efforts were made to edit the dictations but occasionally words are mis-transcribed.)        Delos Haring, MD  01/12/18 1842             ED Medication Orders (From admission, onward)    Start Ordered     Status Ordering Provider    01/12/18 1600 01/12/18 1558  morphine injection 4 mg  ONCE     Last MAR action:  Given - by Loretha Brasil on 01/12/18 at  1629 Ariani Seier        Lab Results           CBC auto differential (Final result)  Result time 01/12/18 16:30:43    Collection Time Result Time WBC RBC HGB HCT MCV MCH MCHC RDW PLT MPV   01/12/18 16:25:00 01/12/18 16:30:00 11.5 3.62 10.9 34.3 95.0 30.1 31.7 18.7 125 7.3     Previous Results     11/29/17 04:48:00 11/29/17 05:15:00 6.3 3.55 11.5 34.2 96.4 32.5 33.7 18.4 97 7.1   11/28/17 03:35:00 11/28/17 03:55:00 4.1 3.11 10.0 30.3 97.5 32.3 33.1 18.0 87 8.0   11/27/17 00:37:00 11/27/17 01:19:00 6.7 3.24 10.5 31.3 96.5 32.5 33.7 17.9 97 7.0     11/26/17 15:34:00 11/26/17 15:57:00 7.8 3.48 11.2 33.8 97.2 32.1 33.0 18.2 103 7.3   10/18/17 09:03:00 10/18/17 09:58:00 4.7 3.76 12.0 36.4 96.8 32.0 33.0 16.5 15specimen checked for clots; none detected.  results rechecked. 7.3       Collection Time   Result Time NEUTRO PCT LYMPHO PCT MONO PCT EOS BASOS PCT NEUTRO ABS LYMPHS ABS MONOS ABS EOS ABS BASOS ABS   01/12/18 16:25:00 01/12/18 16:30:00 86.5 4.4 9.0 0.0 0.1 9.9 0.5 1.0 0.0 0.0     Previous Results   11/29/17 04:48:00 11/29/17 05:15:00 81.7 9.8   8.1 0.1 0.3 5.1 0.6 0.5 0.0 0.0   11/28/17 03:35:00 11/28/17 03:55:00 77.6 10.4 11.6 0.3 0.1 3.2 0.4 0.5 0.0 0.0   11/27/17 00:37:00 11/27/17 01:19:00 86.3 7.3 6.3 0.0 0.1 5.8 0.5 0.4 0.0 0.0   11/26/17 15:34:00 11/26/17 15:57:00 91.5 4.5 3.9 0.0 0.1 7.1   0.4 0.3 0.0 0.0   10/18/17 09:03:00 10/18/17 09:58:00 85.0 3.0 12.0 0.0 0.0 4.0 0.1 0.6 0.0 0.0       Collection Time Result Time PLATELET SLIDE REVIEW ANISOCYTOS   01/12/18 16:25:00 01/12/18 16:30:00       Previous Results   11/29/17 04:48:00 11/29/17   05:15:00     11/28/17 03:35:00 11/28/17 03:55:00     11/27/17 00:37:00 11/27/17 01:19:00     11/26/17 15:34:00 11/26/17 15:57:00     10/18/17 09:03:00 10/18/17 09:58:00 Decreased 1+           Final result            Narrative:   Performed at: [facility] [address],  [place], [place] [address]   Phone [phone]                         Basic Metabolic Panel w/ Reflex to MG (Final result)  Result time 01/12/18 16:48:08    Collection Time  Result Time NA krflxmg CL CO2 ANION GAP GLUCOSE BUN CREATININE GFR Non-African American GFR African American   01/12/18 16:25:00 01/12/18 16:48:00   134 4.8 98 24 12 131 32 0.8 >60 >60 mL/min/1.10m2 EGFR, calc. for ages 12 and older using the MDRD formula (not corrected for weight), is valid for stable renal function.  >60 Chronic Kidney Disease: less than 60 ml/min/1.73 sq.m.         Kidney Failure:   less than 15 ml/min/1.73 sq.m. Results valid for patients 18 years and older.      Previous Results   11/29/17 04:48:00 11/29/17 05:58:00 139 4.5 98 25 16 169 19 0.7 >60>60 mL/min/1.53m2 EGFR, calc. for ages 76 and older using the MDRD formula (not   corrected for weight), is valid for stable renal function.  >60Chronic Kidney Disease: less than 60 ml/min/1.73 sq.m.         Kidney Failure: less than 15 ml/min/1.73 sq.m. Results valid for patients 18 years and older.    11/28/17 03:35:00 11/28/17   04:04:00 141 3.7 104 26 11 104 20 0.8 >60>60 mL/min/1.88m2 EGFR, calc. for ages 15 and older using the MDRD formula (not corrected for weight), is valid for stable renal function.  >60Chronic Kidney Disease: less than 60 ml/min/1.73 sq.m.         Kidney   Failure: less than 15 ml/min/1.73 sq.m. Results valid for patients 18 years and older.    11/27/17 00:37:00 11/27/17 01:30:00 137 4.7 100 24 13 172 20 0.8 >60>60 mL/min/1.66m2 EGFR, calc. for ages 4 and older using the MDRD formula (not corrected for   weight), is valid for stable renal function.  >60Chronic Kidney Disease: less than 60 ml/min/1.73 sq.m.         Kidney Failure: less than 15 ml/min/1.73 sq.m. Results valid for patients 18 years and older.    11/26/17 15:34:00 11/26/17 16:15:00 140 4.4   102 25 13 171 20 0.7 >60>60 mL/min/1.80m2 EGFR, calc. for ages 2 and older using the MDRD formula (not corrected for weight), is valid for stable renal function.   >60Chronic Kidney Disease: less than 60 ml/min/1.73 sq.m.         Kidney Failure: less   than 15 ml/min/1.73 sq.m. Results valid for patients 18 years and older.    05/29/17 03:15:00 05/29/17 04:11:00 138 4.2 103 22 13 193 22 0.8 >60>60 mL/min/1.89m2 EGFR, calc. for ages 38 and older using the MDRD formula (not corrected for weight), is   valid for stable renal function.  >60Chronic Kidney Disease: less than 60 ml/min/1.73 sq.m.         Kidney Failure: less than 15 ml/min/1.73 sq.m. Results valid for patients 18 years and older.        Collection Time Result Time CALCIUM   01/12/18   16:25:00 01/12/18 16:48:00 9.9     Previous Results   11/29/17 04:48:00 11/29/17 05:58:00 9.7   11/28/17 03:35:00 11/28/17 04:04:00 9.2   11/27/17 00:37:00 11/27/17 01:30:00 9.3   11/26/17 15:34:00 11/26/17 16:15:00 9.7   05/29/17 03:15:00 05/29/17   04:11:00 7.3           Final result            Narrative:   Performed at: [facility] [address],  [place], [place] [address]   Phone [phone]                       PT - INR (  Final result)  Result time 01/12/18 16:48:08    Collection Time Result Time Protime INR   01/12/18 16:24:00 01/12/18 16:48:00 12.7 Effective 01-28-17 09:00am EST Please note reference ranges have changed for PT and INR Testing.  1.11 Effective   01/28/17 at 09:00am EST  Normal: 0.86 - 1.14 Therapeutic: 2.0 - 3.0 Pros. Valve: 2.5 - 3.5 AMI: 2.0 - 3.0      Previous Results   11/29/17 04:48:00 11/29/17 05:14:00 12.3Effective 01-28-17 09:00am EST Please note reference ranges have changed for PT and   INR Testing.  1.08Effective 01/28/17 at 09:00am EST  Normal: 0.86 - 1.14 Therapeutic: 2.0 - 3.0 Pros. Valve: 2.5 - 3.5 AMI: 2.0 - 3.0    11/27/17 00:37:00 11/27/17 01:37:00 12.1Effective 01-28-17 09:00am EST Please note reference ranges have changed for   PT and INR Testing.  1.06Effective 01/28/17 at 09:00am EST  Normal: 0.86 - 1.14 Therapeutic: 2.0 - 3.0 Pros. Valve: 2.5 - 3.5  AMI: 2.0 - 3.0    06/21/17 09:35:00 06/21/17 10:07:00 12.3Effective 01-28-17 09:00am EST Please note reference ranges have   changed for PT and INR Testing.  1.08Effective 10/16/2015 at 9am EST  Normal: 0.85 - 1.15 Therapeutic: 2.0 - 3.0 Pros. Valve: 2.5 - 3.5 AMI: 2.0 - 3.0    05/27/17 03:00:00 05/27/17 03:24:00 12.0Effective 01-28-17 09:00am EST Please note reference ranges   have changed for PT and INR Testing.  1.05Effective 10/16/2015 at 9am EST  Normal: 0.85 - 1.15 Therapeutic: 2.0 - 3.0 Pros. Valve: 2.5 - 3.5 AMI: 2.0 - 3.0    05/25/17 10:35:00 05/25/17 11:21:00 12.6Effective 01-28-17 09:00am EST Please note reference   ranges have changed for PT and INR Testing.  1.11Effective 10/16/2015 at 9am EST  Normal: 0.85 - 1.15 Therapeutic: 2.0 - 3.0 Pros. Valve: 2.5 - 3.5 AMI: 2.0 - 3.0            Final result            Narrative:   Performed at: [facility] [address],  [place], [place] [address]   Phone [phone]                    Imaging Results    None     ED Administered Medications from 01/12/2018 1520 to 01/12/2018 1842       Date/Time Order Dose Route Action Action by Comments     01/12/2018 1629 morphine injection 4 mg 4 mg Intravenous Given Loretha Brasil, RN         ED Treatment Team     Provider Role From To Phone Pager    Delos Haring, MD Attending Provider 01/12/18 [phone] -- [phone]         Code Onset/Outcome    None     Code Interventions/Drips/Airways    None       Diagnoses     Diagnosis Comment    Multiple myeloma not having achieved remission (HCC)     Back pain of thoracolumbar region     Mass of spine           ED Prescriptions     None        Follow-up Information     Follow up With Specialties Details Why Contact Info    Emilee Hero, MD Hematology and Oncology   [address] [place] [address] [phone]     Leola Brazil, MD Hematology and Oncology  [address] [place] [address] [phone]           Discharge  Instructions    None

## 2018-01-12 NOTE — Progress Notes (Signed)
4 Eyes Admission Assessment     I agree as the admission nurse that 2 RN's have performed a thorough Head to Toe Skin Assessment on the patient. ALL assessment sites listed below have been assessed on admission.       Areas assessed by both nurses: Milinda Pointer RN and Shamra Bradeen RN   [x]    Head, Face, and Ears   [x]    Shoulders, Back, and Chest  [x]    Arms, Elbows, and Hands   [x]    Coccyx, Sacrum, and Ischum  [x]    Legs, Feet, and Heels        Does the Patient have Skin Breakdown?  No         Braden Prevention initiated:  No   Wound Care Orders initiated:  No      WOC nurse consulted for Pressure Injury (Stage 3,4, Unstageable, DTI, NWPT, and Complex wounds):  No      Nurse 1 eSignature: Electronically signed by Milinda Pointer, RN on 01/12/18 at 9:48 PM    **SHARE this note so that the co-signing nurse is able to place an eSignature**    Nurse 2 eSignature: Electronically signed by Kemper Durie, RN on 01/13/18 at 1:13 AM

## 2018-01-12 NOTE — ED Provider Notes (Signed)
[place]          ATTENDING PHYSICIAN NOTE       Date of evaluation: 01/12/2018    Chief Complaint     Back Pain (severe Back pain,  Pet scan on 01/10/18 shower new progression Myloma on spine)      History of Present Illness     David Watkins is a 62 y.o. male with a history of multiple myeloma with known significant spinal lesions, who presents with uncontrolled pain, with a recent PET scan that shows an increase in the number of lesions, as well as a large left mass.  Patient's has been taking Percocet and has a fentanyl patch for pain control.  He states that he always is in pain, but is generally functional, and in fact works 5 days a week.  He has had some right low back to gluteal pain that seems to radiate down the medial aspect of the right leg, as well as some weakness in his bilateral legs, somewhat worse on the left than the right, which has been present for some months, but does seem worse in the last couple of weeks.  He denies any changes in sensation.  He denies any difficulty with bladder or bowel movements or saddle anesthesia.  He denies any significant nausea or vomiting.  He describes that his worst pain is in the left lateral thoracoabdominal area, approximately in the posterior axillary line, from the lower left ribs through the area of the iliac wing.  He is very tender to even light palpation in that area, and he believes that corresponds to the area of the left-sided mass that was seen on PET.  Patient states that he was called by his oncology office and told to pack a bag and go to the emergency room.  He is somewhat worried about these instructions in the setting of his recent PET.  The patient describes his pain as being 10 out of 10 in intensity, aching in character, throughout the back, but particularly in the left thoracoabdominal area as described above.  He states that this has been present for some weeks, but was particularly worse last  night.    Review of Systems     Review of Systems   All other systems reviewed and are negative.      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  He has a past surgical history that includes bone marrow biopsy; other surgical history (Left, 08/17/2016); bone marrow transplant; and pre-malignant / benign skin lesion excision (Left, 03/29/2017).  His family history includes Heart Attack in his brother; Heart Disease in his mother; Lung Cancer in his father.  He reports that he quit smoking about 5 years ago. He has never used smokeless tobacco. He reports that he does not drink alcohol or use drugs.    Medications     Previous Medications    ALBUTEROL SULFATE HFA (PROAIR HFA) 108 (90 BASE) MCG/ACT INHALER    Inhale 2 puffs into the lungs every 6 hours as needed for Wheezing    ATORVASTATIN (LIPITOR) 10 MG TABLET    Take 10 mg by mouth nightly     CALCIUM CARBONATE (OSCAL) 500 MG TABS TABLET    Take 500 mg by mouth 2 times daily  DEXAMETHASONE (DECADRON) 4 MG TABLET    Take 4 mg by mouth 3 times daily    FENTANYL (DURAGESIC) 25 MCG/HR    Place 1 patch onto the skin every 72 hours.    FLUTICASONE-UMECLIDIN-VILANT (TRELEGY ELLIPTA) 100-62.5-25 MCG/INH AEPB    Inhale 1 puff into the lungs daily    GABAPENTIN (NEURONTIN) 300 MG CAPSULE    Take 1 capsule by mouth 3 times daily for 30 days.Marland Kitchen    LIDOCAINE-PRILOCAINE (EMLA) 2.5-2.5 % CREAM    Apply topically as needed for Pain Apply topically as needed.    ONDANSETRON (ZOFRAN) 4 MG TABLET    Take 1 tablet by mouth every 6 hours as needed for Nausea or Vomiting    OXYCODONE-ACETAMINOPHEN (PERCOCET) 7.5-325 MG PER TABLET    Take 1 tablet by mouth every 4 hours as needed for Pain.Marland Kitchen    PANTOPRAZOLE (PROTONIX) 40 MG TABLET    Take 1 tablet by mouth every morning (before breakfast)    PROCHLORPERAZINE (COMPAZINE)  10 MG TABLET    Take 1 tablet by mouth every 6 hours as needed (Nausea)    SERTRALINE (ZOLOFT) 50 MG TABLET    Take 50 mg by mouth daily    VALACYCLOVIR (VALTREX) 500 MG TABLET    Take 500 mg by mouth 2 times daily       Allergies     He is allergic to lenalidomide; pomalidomide; and ceclor [cefaclor].    Physical Exam     INITIAL VITALS: BP: (!) 125/95, Temp: 97.4 ??F (36.3 ??C), Pulse: 85, Resp: 18, SpO2: 95 %     General: Well appearing.  Uncomfortable appearing, but in NAD.    HEENT: Pupils are equal, round, and reactive to light. Extraocular muscles are intact.  Conjunctivae are clear and moist. No redness or drainage from the eyes.     Neck: Supple, with full range of motion.    Back: No focal midline T or L spine tenderness to palpation, crepitus, or step-offs.   There is generalized tenderness to palpation in the left lateral back in approximately the posterior axillary line, from the lower rib cage through the iliac crest, where the patient is tender even to mild palpation.    Chest: Not tender to palpation.    Cardiovascular: Normal S1-S2 without murmur rub or gallop. 2+ radial pulses bilaterally. 2+ DP pulses bilaterally.    Respiratory: Unlabored breathing with equal chest rise and fall. Lungs are clear to auscultation bilaterally. No adventitious lung sounds heard.    Abdomen: Soft and nontender, without guarding or rebound tenderness. No masses or hepatosplenomegaly.    Skin: Warm and dry, without rashes or ecchymoses, lacerations or abrasions.     Neuro: Alert and oriented x3.  Patient has full strength and dorsiflexion and plantar flexion at the ankles.  He has some decreased strength in resisted flexion at the hips bilaterally, slightly worse on the left than the right.  He has slightly decreased strength to resisted flexion at the elbow on the left, otherwise good strength throughout.    Extremities: Warm and well-perfused, without clubbing, cyanosis, or edema. The patient moves all extremities  equally.    Psych: The patient's mood and affect are generally within normal limits for their presentation.      Diagnostic Results       RADIOLOGY:  No orders to display       LABS:   Results for orders placed or performed during the hospital encounter of 01/12/18   CBC auto  differential   Result Value Ref Range    WBC 11.5 (H) 4.0 - 11.0 K/uL    RBC 3.62 (L) 4.20 - 5.90 M/uL    Hemoglobin 10.9 (L) 13.5 - 17.5 g/dL    Hematocrit 34.3 (L) 40.5 - 52.5 %    MCV 95.0 80.0 - 100.0 fL    MCH 30.1 26.0 - 34.0 pg    MCHC 31.7 31.0 - 36.0 g/dL    RDW 18.7 (H) 12.4 - 15.4 %    Platelets 125 (L) 135 - 450 K/uL    MPV 7.3 5.0 - 10.5 fL    Neutrophils % 86.5 %    Lymphocytes % 4.4 %    Monocytes % 9.0 %    Eosinophils % 0.0 %    Basophils % 0.1 %    Neutrophils # 9.9 (H) 1.7 - 7.7 K/uL    Lymphocytes # 0.5 (L) 1.0 - 5.1 K/uL    Monocytes # 1.0 0.0 - 1.3 K/uL    Eosinophils # 0.0 0.0 - 0.6 K/uL    Basophils # 0.0 0.0 - 0.2 K/uL   PT - INR   Result Value Ref Range    Protime 12.7 9.8 - 13.0 sec    INR 1.11 0.86 - 4.09   Basic Metabolic Panel w/ Reflex to MG   Result Value Ref Range    Sodium 134 (L) 136 - 145 mmol/L    Potassium reflex Magnesium 4.8 3.5 - 5.1 mmol/L    Chloride 98 (L) 99 - 110 mmol/L    CO2 24 21 - 32 mmol/L    Anion Gap 12 3 - 16    Glucose 131 (H) 70 - 99 mg/dL    BUN 32 (H) 7 - 20 mg/dL    CREATININE 0.8 (L) 0.9 - 1.3 mg/dL    GFR Non-African American >60 >60    GFR African American >60 >60    Calcium 9.9 8.3 - 10.6 mg/dL       RECENT VITALS:  BP: 130/87, Temp: 97.4 ??F (36.3 ??C), Pulse: 83, Resp: 18, SpO2: 95 %     Procedures       ED Course     Nursing Notes, Past Medical Hx, Past Surgical Hx, Social Hx, Allergies, and Family Hx were reviewed.    The patient was given the following medications:  Orders Placed This Encounter   Medications   ??? morphine injection 4 mg       CONSULTS:  [place] / ASSESSMENT / PLAN     HOLMES HAYS is a 62 y.o. male with an unfortunate  history of multiple myeloma, with a PET scan which shows progression of the disease, and pain which is poorly controlled at home.  The patient was called by his oncology team and told to come to the emergency department, presumably for admission.  His symptoms have been gradually progressive over recent weeks, but are not markedly different today than previously, although his pain was especially poorly controlled last night.  He has some weakness in his bilateral lower extremities, somewhat worse on the left than the right, and some weakness in his left upper extremity.  However, the patient states that all of this weakness has been present for some time, and is not new.  He does not have any findings concerning for cauda equina or other spinal surgical emergency.  He was given a dose of IV morphine in the emergency department,  which improved his pain somewhat.  The patient's case was discussed with Dr. Bella Kennedy, who has placed admission orders for the patient.      Clinical Impression     1. Multiple myeloma not having achieved remission (HCC)    2. Back pain of thoracolumbar region    3. Mass of spine        Disposition     PATIENT REFERRED TO:  Harlene Salts, MD  [place] [address]  [phone]          [address] [address], [place] [address]  [phone]            DISCHARGE MEDICATIONS:  New Prescriptions    No medications on file       DISPOSITION Admitted    (Please note that portions of this note were completed with voice recognition software.  Efforts were made to edit the dictations but occasionally words are mis-transcribed.)        Jess Barters, MD  01/12/18 1842

## 2018-01-12 NOTE — Progress Notes (Signed)
VSS. Ortho negative. Assessment completed. Scheduled meds given at this time. Pt resting comfortably in chair. Call light within reach. Family at  Bedside. Denies further needs at this time. Will continue to monitor. Electronically signed by Milinda Pointer, RN on 01/12/2018 at 8:23 PM

## 2018-01-12 NOTE — ED Triage Notes (Addendum)
Ph has Myeloma and had a PET scan on 01/10/18 Pt Dr told pt to come to ed because pf new progression of Myeloma (mass) on spine. Pt has back pain 10 /10/ Pt suppose to have Bone marrow transplant in next 4 to 6 weeks after clearance from pulmonology, Pt was recently started on steroids. Pt has card for power port rt upper chest.

## 2018-01-12 NOTE — Progress Notes (Signed)
RESPIRATORY THERAPY ASSESSMENT    Name:  David Watkins  Medical Record Number:  [id number]  Age: 62 y.o.   Gender: male  DOB: 18-Jun-1956  Today's Date:  01/12/2018  Room:  3515/3515-01    Assessment     Is the patient being admitted for a COPD or Asthma exacerbation?  No   (If yes the patient will be seen every 4 hours for the first 24 hours and then reassessed)    Patient Admission Diagnosis      Allergies  Allergies   Allergen Reactions   . Lenalidomide Rash   . Pomalidomide Hives   . Ceclor [Cefaclor] Hives       Minimum Predicted Vital Capacity:     1050          Actual Vital Capacity:      1250              Pulmonary History:former smoker  Home Oxygen Therapy:  room air  Home Respiratory Therapy:Albuterol and Budesonide/Formoterol    Current Respiratory Therapy:  hhn albuterol prn, hhn pulmicort bid, hhn performist          Respiratory Severity Index(RSI)   Patients with orders for inhalation medications, oxygen, or any therapeutic treatment modality will be placed on Respiratory Protocol.  They will be assessed with the first treatment and at least every 72 hours thereafter.  The following severity scale will be used to determine frequency of treatment intervention.    Smoking History: Pulmonary Disease or Smoking History, Greater than 15 pack year = 2    Social History  Social History     Tobacco Use   . Smoking status: Former Smoker     Last attempt to quit: 11/25/2012     Years since quitting: 5.1   . Smokeless tobacco: Never Used   Substance Use Topics   . Alcohol use: Yes     Comment: socially   . Drug use: No       Recent Surgical History: None = 0  Past Surgical History  Past Surgical History:   Procedure Laterality Date   . BONE MARROW BIOPSY     . BONE MARROW TRANSPLANT     . OTHER SURGICAL HISTORY Left 08/17/2016    trifusion cath placement   . PRE-MALIGNANT / BENIGN SKIN LESION EXCISION Left 03/29/2017    EXCISE LESION LEFT EXTERNAL EAR WITH FROZEN SECTION, FULL THICKNESS SKIN GRAFT       Level of  Consciousness: Alert, Oriented, and Cooperative = 0    Level of Activity: Walking unassisted = 0    Respiratory Pattern: Regular Pattern; RR 8-20 = 0    Breath Sounds: Clear = 0    Sputum   ,  ,    Cough: Strong, spontaneous, non-productive = 0    Vital Signs   BP (!) 146/93   Pulse 87   Temp 98 F (36.7 C) (Oral)   Resp 16   Ht 5\' 9"  (1.753 m)   Wt 195 lb (88.5 kg)   SpO2 95%   BMI 28.80 kg/m   SPO2 (COPD values may differ): Greater than or equal to 92% on room air = 0    Peak Flow (asthma only): not applicable = 0    RSI: 0-4 = See once and convert to home regimen or discontinue        Plan       Goals: medication delivery    Patient/caregiver was educated on the proper method  of use for Respiratory Care Devices:  Yes      Level of patient/caregiver understanding able to:   ? Verbalize understanding   ? Demonstrate understanding       ? Teach back        ? Needs reinforcement       ?  No available caregiver               ?  Other:     Response to education:  Excellent     Is patient being placed on Home Treatment Regimen?  Yes     Does the patient have everything they need prior to discharge?  Yes     Comments: hhn albuterol prn, hhn pulmicort bid, hhn performist bid    Plan of Care: hx multiple myeloma    Electronically signed by Fransico Michael, RCP on 01/12/2018 at 7:40 PM    Respiratory Protocol Guidelines     1. Assessment and treatment by Respiratory Therapy will be initiated for medication and therapeutic interventions upon initiation of aerosolized medication.  2. Physician will be contacted for respiratory rate (RR) greater than 35 breaths per minute. Therapy will be held for heart rate (HR) greater than 140 beats per minute, pending direction from physician.  3. Bronchodilators will be administered via Metered Dose Inhaler (MDI) with spacer when the following criteria are met:  a. Alert and cooperative     b. HR < 140 bpm  c. RR < 30 bpm                d. Can demonstrate a 2-3 second inspiratory  hold  4. Bronchodilators will be administered via Hand Held Nebulizer [facility]) to patients when ANY of the following criteria are met  a. Incognizant or uncooperative          b. Patients treated with HHN at Home        c. Unable to demonstrate proper use of MDI with spacer     d. RR > 30 bpm   5. Bronchodilators will be delivered via Metered Dose Inhaler (MDI), HHN, Aerogen to intubated patients on mechanical ventilation.  6. Inhalation medication orders will be delivered and/or substituted as outlined below.    Aerosolized Medications Ordering and Administration Guidelines:    1. All Medications will be ordered by a physician, and their frequency and/or modality will be adjusted as defined by the patients Respiratory Severity Index (RSI) score.  2. If the patient does not have documented COPD, consider discontinuing anticholinergics when RSI is less than 9.  3. If the bronchospasm worsens (increased RSI), then the bronchodilator frequency can be increased to a maximum of every 4 hours.  If greater than every 4 hours is required, the physician will be contacted.  4. If the bronchospasm improves, the frequency of the bronchodilator can be decreased, based on the patient's RSI, but not less than home treatment regimen frequency.  5. Bronchodilator(s) will be discontinued if patient has a RSI less than 9 and has received no scheduled or as needed treatment for 72  Hrs.    Patients Ordered on a Mucolytic Agent:    1. Must always be administered with a bronchodilator.    2. Discontinue if patient experiences worsened bronchospasm, or secretions have lessened to the point that the patient is able to clear them with a cough.    Anti-inflammatory and Combination Medications:    1. If the patient lacks prior history of lung disease, is not using inhaled  anti-inflammatory medication at home, and lacks wheezing by examination or by history for at least 24 hours, contact physician for possible discontinuation.

## 2018-01-12 NOTE — Progress Notes (Signed)
Golda Acre Dx Multiple myeloma.  01/10/18 PET scan show new progressive disease- Scan reviewed by Dr. Bella Kennedy.  Patient reporting severe pain rating 9-10 and escalates up to "40".  Pain Left side from ribcage to waist to spine.  Numbness on Right sided lower back through right leg.  Bowel and bladder function is reported as normal.   Leg strength is "not strong because I have not been walking around much".  Current pain medication is Fentanyl patch that is supplemented with Percocet.  Additionally, he is on he saw Dr. Dorann Lodge on 01/10/18 and he increased his Decadron to 12 mg daily.  Dr. Bella Kennedy reviewed patient case and advised patient to have caregiver bring him to [facility] Emergency room.  Patient agrees to plan.   I have taken records to the ER.  Barbie Banner RN BSN  OCN  [place]  Transplant Coordinator  [facility]

## 2018-01-12 NOTE — ED Notes (Signed)
Home Med List is complete.  Source of medications in list is interview with patient and wife, and review of recent fill history.  ??  COMPLETED AFTER ORDERS WERE PLACED    Patient states he had all am meds today, but had not had his afternoon doses of dexamethasone or gabapentin. Patient is due this afternoon to have his fentanyl patch changed.  ??  Please note:  1. Removed from pt's med list: trazodone 50 mg  2. Added to pt's med list: EMLA cream for port, fentanyl 25 mcg patch (last filled 05/08 #10)  3. Changes to pt's med list:   -Patient takes lipitor at night, not in the am   -Patient is taking oscal 500 BID, not 200 mg tabs   -Patient is taking dexamethasone 4 mg TID currently for respiratory flare up, not QD. Has only had am dose and normally tries to not take the last dose later than 1800 due to issues with insomnia   -Patient recently ( 05/13) changed from [place] to [place].   -Patient normally only needs percocet in the evenings, but has been having more pain lately.    Mickel Crow  PharmD Candidate 2019  01/12/2018 6:21 PM

## 2018-01-12 NOTE — ED Notes (Signed)
Pt medicated according to [facility] pt rates pain 10/10      Lauretta Grill, RN  01/12/18 [phone]

## 2018-01-12 NOTE — ED Notes (Signed)
Report called to Chinita Pester, Nurse updated on plan of care. Pt to room 3515 with all belongings     Lauretta Grill, RN  01/12/18 1840

## 2018-01-12 NOTE — ED Notes (Signed)
Pt wants numbing ointment placed on port before assess Pt has own supply Md aware.      Lauretta Grill, RN  01/12/18 1555

## 2018-01-13 LAB — TYPE AND SCREEN
ABO/Rh: O POS
Antibody Screen: POSITIVE

## 2018-01-13 LAB — PHOSPHORUS: Phosphorus: 4.6 mg/dL (ref 2.5–4.9)

## 2018-01-13 LAB — HEPATIC FUNCTION PANEL
ALT: 40 U/L (ref 10–40)
AST: 31 U/L (ref 15–37)
Albumin: 3.7 g/dL (ref 3.4–5.0)
Alkaline Phosphatase: 68 U/L (ref 40–129)
Bilirubin, Direct: <0.2 mg/dL (ref 0.0–0.3)
Total Bilirubin: 0.3 mg/dL (ref 0.0–1.0)
Total Protein: 7.3 g/dL (ref 6.4–8.2)

## 2018-01-13 LAB — BASIC METABOLIC PANEL
Anion Gap: 13 (ref 3–16)
BUN: 33 mg/dL — ABNORMAL HIGH (ref 7–20)
CO2: 24 mmol/L (ref 21–32)
Calcium: 9.3 mg/dL (ref 8.3–10.6)
Chloride: 97 mmol/L — ABNORMAL LOW (ref 99–110)
Creatinine: 0.8 mg/dL — ABNORMAL LOW (ref 0.9–1.3)
GFR African American: >60 (ref >60)
GFR Non-African American: >60 (ref >60)
Glucose: 151 mg/dL — ABNORMAL HIGH (ref 70–99)
Potassium: 4.7 mmol/L (ref 3.5–5.1)
Sodium: 134 mmol/L — ABNORMAL LOW (ref 136–145)

## 2018-01-13 LAB — CBC WITH AUTO DIFFERENTIAL
Basophils %: 0.4 %
Basophils Absolute: 0 10*3/uL (ref 0.0–0.2)
Eosinophils %: 0 %
Eosinophils Absolute: 0 10*3/uL (ref 0.0–0.6)
Hematocrit: 30.3 % — ABNORMAL LOW (ref 40.5–52.5)
Hemoglobin: 10 g/dL — ABNORMAL LOW (ref 13.5–17.5)
Lymphocytes %: 4.4 %
Lymphocytes Absolute: 0.5 10*3/uL — ABNORMAL LOW (ref 1.0–5.1)
MCH: 31.1 pg (ref 26.0–34.0)
MCHC: 32.9 g/dL (ref 31.0–36.0)
MCV: 94.7 fL (ref 80.0–100.0)
MPV: 8.2 fL (ref 5.0–10.5)
Monocytes %: 7.8 %
Monocytes Absolute: 0.8 10*3/uL (ref 0.0–1.3)
Neutrophils %: 87.4 %
Neutrophils Absolute: 9.2 10*3/uL — ABNORMAL HIGH (ref 1.7–7.7)
Platelets: 122 10*3/uL — ABNORMAL LOW (ref 135–450)
RBC: 3.2 M/uL — ABNORMAL LOW (ref 4.20–5.90)
RDW: 18.5 % — ABNORMAL HIGH (ref 12.4–15.4)
WBC: 10.6 10*3/uL (ref 4.0–11.0)

## 2018-01-13 LAB — ANTIBODY IDENTIFICATION: Antibody ID: POSITIVE

## 2018-01-13 LAB — LACTATE DEHYDROGENASE: LD: 349 U/L — ABNORMAL HIGH (ref 100–190)

## 2018-01-13 LAB — URINALYSIS
Bilirubin Urine: NEGATIVE
Blood, Urine: NEGATIVE
Glucose, Ur: NEGATIVE mg/dL
Ketones, Urine: NEGATIVE mg/dL
Leukocyte Esterase, Urine: NEGATIVE
Nitrite, Urine: NEGATIVE
Protein, UA: 30 mg/dL — AB
Specific Gravity, UA: >=1.03 (ref 1.005–1.030)
Urobilinogen, Urine: 0.2 E.U./dL (ref <2.0)
pH, UA: 6 (ref 5.0–8.0)

## 2018-01-13 LAB — APTT: aPTT: 24.1 s — ABNORMAL LOW (ref 26.0–36.0)

## 2018-01-13 LAB — PROTIME-INR
INR: 1.09 (ref 0.86–1.14)
Protime: 12.4 s (ref 9.8–13.0)

## 2018-01-13 LAB — DAT IGG: DAT IgG: NEGATIVE

## 2018-01-13 LAB — MAGNESIUM: Magnesium: 2 mg/dL (ref 1.80–2.40)

## 2018-01-13 LAB — MICROSCOPIC URINALYSIS: RBC, UA: NONE SEEN /HPF (ref 0–2)

## 2018-01-13 LAB — URIC ACID: Uric Acid, Serum: 3.5 mg/dL (ref 3.5–7.2)

## 2018-01-13 MED ORDER — ONDANSETRON HCL 8 MG PO TABS
8 MG | ORAL | Status: AC
Start: 2018-01-13 — End: 2018-01-17
  Administered 2018-01-13 – 2018-01-17 (×5): 24 mg via ORAL

## 2018-01-13 MED ORDER — LORAZEPAM 0.5 MG PO TABS
0.5 MG | ORAL | Status: DC | PRN
Start: 2018-01-13 — End: 2018-01-18
  Administered 2018-01-14: 02:00:00 0.5 mg via ORAL

## 2018-01-13 MED ORDER — DEXAMETHASONE 4 MG PO TABS
4 MG | ORAL | Status: AC
Start: 2018-01-13 — End: 2018-01-16
  Administered 2018-01-13 – 2018-01-16 (×4): 40 mg via ORAL

## 2018-01-13 MED ORDER — OXYCODONE HCL 5 MG PO TABS
5 MG | ORAL | Status: DC | PRN
Start: 2018-01-13 — End: 2018-01-17
  Administered 2018-01-13 – 2018-01-17 (×24): 10 mg via ORAL

## 2018-01-13 MED ORDER — PROCHLORPERAZINE EDISYLATE 10 MG/2ML IJ SOLN
10 MG/2ML | INTRAMUSCULAR | Status: DC | PRN
Start: 2018-01-13 — End: 2018-01-18
  Administered 2018-01-16: 14:00:00 10 mg via INTRAVENOUS

## 2018-01-13 MED ORDER — LORAZEPAM 2 MG/ML IJ SOLN
2 MG/ML | INTRAMUSCULAR | Status: DC | PRN
Start: 2018-01-13 — End: 2018-01-18

## 2018-01-13 MED ORDER — SODIUM CHLORIDE 0.9 % IV SOLN
0.9 % | INTRAVENOUS | Status: DC
Start: 2018-01-13 — End: 2018-01-18
  Administered 2018-01-13 – 2018-01-18 (×10): 1000 mL via INTRAVENOUS

## 2018-01-13 MED ORDER — CYCLOPHOSPHAMIDE 1 G IJ SOLR
1 GM | INTRAMUSCULAR | Status: AC
Start: 2018-01-13 — End: 2018-01-17
  Administered 2018-01-13 – 2018-01-16 (×4): via INTRAVENOUS

## 2018-01-13 MED ORDER — FENTANYL 50 MCG/HR TD PT72
50 MCG/HR | TRANSDERMAL | Status: DC
Start: 2018-01-13 — End: 2018-01-18
  Administered 2018-01-13 – 2018-01-16 (×2): 1 via TRANSDERMAL

## 2018-01-13 MED ORDER — PROCHLORPERAZINE MALEATE 10 MG PO TABS
10 MG | ORAL | Status: DC | PRN
Start: 2018-01-13 — End: 2018-01-18
  Administered 2018-01-18: 08:00:00 10 mg via ORAL

## 2018-01-13 MED ORDER — ENOXAPARIN SODIUM 40 MG/0.4ML SC SOLN
40 MG/0.4ML | Freq: Every evening | SUBCUTANEOUS | Status: DC
Start: 2018-01-13 — End: 2018-01-18
  Administered 2018-01-13 – 2018-01-17 (×5): 40 mg via SUBCUTANEOUS

## 2018-01-13 MED ORDER — FLUCONAZOLE 200 MG PO TABS
200 MG | Freq: Every day | ORAL | Status: DC
Start: 2018-01-13 — End: 2018-01-18
  Administered 2018-01-13 – 2018-01-18 (×6): 200 mg via ORAL

## 2018-01-13 MED ORDER — FUROSEMIDE 10 MG/ML IJ SOLN
10 MG/ML | Freq: Two times a day (BID) | INTRAMUSCULAR | Status: DC
Start: 2018-01-13 — End: 2018-01-16
  Administered 2018-01-14 – 2018-01-15 (×2): 40 mg via INTRAVENOUS

## 2018-01-13 MED FILL — FENTANYL 25 MCG/HR TD PT72: 25 ug/h | TRANSDERMAL | Qty: 1

## 2018-01-13 MED FILL — FLUCONAZOLE 200 MG PO TABS: 200 mg | ORAL | Qty: 1

## 2018-01-13 MED FILL — DEXAMETHASONE 4 MG PO TABS: 4 mg | ORAL | Qty: 1

## 2018-01-13 MED FILL — ONDANSETRON HCL 8 MG PO TABS: 8 mg | ORAL | Qty: 3

## 2018-01-13 MED FILL — OXYCODONE HCL 5 MG PO TABS: 5 mg | ORAL | Qty: 2

## 2018-01-13 MED FILL — ALBUTEROL SULFATE (2.5 MG/3ML) 0.083% IN NEBU: RESPIRATORY_TRACT | Qty: 3

## 2018-01-13 MED FILL — CYCLOPHOSPHAMIDE 1 G IJ SOLR: 1 g | INTRAMUSCULAR | Qty: 40

## 2018-01-13 MED FILL — LOVENOX 40 MG/0.4ML SC SOLN: 40 MG/0.4ML | SUBCUTANEOUS | Qty: 0.4

## 2018-01-13 MED FILL — PERFOROMIST 20 MCG/2ML IN NEBU: 20 MCG/2ML | RESPIRATORY_TRACT | Qty: 2

## 2018-01-13 MED FILL — DEXAMETHASONE 4 MG PO TABS: 4 mg | ORAL | Qty: 10

## 2018-01-13 MED FILL — OYSTER SHELL CALCIUM 500 MG PO TABS: 500 mg | ORAL | Qty: 1

## 2018-01-13 MED FILL — SERTRALINE HCL 50 MG PO TABS: 50 mg | ORAL | Qty: 1

## 2018-01-13 MED FILL — FENTANYL 50 MCG/HR TD PT72: 50 ug/h | TRANSDERMAL | Qty: 1

## 2018-01-13 MED FILL — LORAZEPAM 0.5 MG PO TABS: 0.5 mg | ORAL | Qty: 1

## 2018-01-13 MED FILL — VALACYCLOVIR HCL 500 MG PO TABS: 500 mg | ORAL | Qty: 1

## 2018-01-13 MED FILL — GABAPENTIN 300 MG PO CAPS: 300 mg | ORAL | Qty: 1

## 2018-01-13 MED FILL — SPIRIVA HANDIHALER 18 MCG IN CAPS: 18 ug | RESPIRATORY_TRACT | Qty: 5

## 2018-01-13 MED FILL — LIPITOR 20 MG PO TABS: 20 mg | ORAL | Qty: 1

## 2018-01-13 MED FILL — MORPHINE SULFATE 2 MG/ML IJ SOLN: 2 mg/mL | INTRAMUSCULAR | Qty: 1

## 2018-01-13 MED FILL — OXYCODONE-ACETAMINOPHEN 7.5-325 MG PO TABS: 7.5-325 mg | ORAL | Qty: 1

## 2018-01-13 MED FILL — BUDESONIDE 0.5 MG/2ML IN SUSP: 0.5 MG/2ML | RESPIRATORY_TRACT | Qty: 2

## 2018-01-13 MED FILL — SODIUM CHLORIDE 0.9 % IV SOLN: 0.9 % | INTRAVENOUS | Qty: 1000

## 2018-01-13 MED FILL — PANTOPRAZOLE SODIUM 40 MG PO TBEC: 40 mg | ORAL | Qty: 1

## 2018-01-13 NOTE — Unmapped (Signed)
Your patient was seen at a [facility]. Please go to [email or web address] to view information filed to your patient's chart in Epic.  If you need to view your patient's results prior to gaining access to   Epic CareLink, please contact the [facility] where your patient was seen.              H&P Notes      H&P by Emilee Hero, MD at 01/13/2018  8:29 AM  Version 2 of 2    Author:  Emilee Hero, MD Service:  Hematology Oncology Author Type:  Physician    Filed:  01/13/2018 10:15 PM Date of Service:  01/13/2018  8:29 AM Status:  Signed    Editor:  Emilee Hero, MD (Physician)    Related Notes:  Original Note by Awilda Metro, APRN - CNP (Nurse Practitioner) filed at 01/13/2018  3:22 PM                                     BCC History and Physical       Attending Physician: Emilee Hero, MD    Primary Care: Emilee Hero, MD       Name: Derek Benson DOB:  Jan 09, 1956  MRN:  [id number]    Admission: 01/12/2018      Date: 01/13/2018    Reason for Admission: Uncontrolled Pain d/t neoplasm     History of Present Illness:[ME.1]     Derek Benson is a 62 yo male w/ 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  His PMH is also significant for  HTN, HLD, anxiety/depression, COPD & peripheral neuropathy. He is most recently been treated with bimonthly velcade &  monthly   daratumumab w/ Dexamethasone for his relapsed MM (last tx 01/03/18). He has been following closely w/ Dr. Karie Kirks for COPD and recently been on a steroid taper for COPD exacerbation.     He recently underwent repeat myeloma labs (01/03/18) that show progressive disease and a PET scan (01/10/18) that showed marked progression w/ multiple new osseous lesions.  There is a paraspinal soft tissue mass along the lower thoracic & upper lumbar   spine.  There are new enlarged lymph nodes in the left axillary & subpectoral region.  He has been complaining of increased back pain and on 01/05/18 his Fentanyl  patch was increased from 12.5 mcg/hr to 25 mcg/hr and Pecocet dose was increased.  He[ME.2]   was[ME.3] then called [facility][ME.2] (01/12/18) w/ the PET scan results.  He was assessed and advised to report to the ED because of increased pain[ME.3], rating 9-10.  His pain is on the left side from the ribcage to waist / spine area.  He denies bowel and   bladder dysfunction and his leg strength is not strong because I have not been walking around much.  He was given Percocet and IV Morphine for management and his pain was slightly improved by the following morning.[ME.2] Today he c/o increased   shortness of breath, but less cough, increased fatigue, decreased appetite and weight loss.  He denies chest pain, headaches, visual changes or mouth pain. He denies increased numbness or lack of feeling in his lower extremities and states his bowel and   bladder function is normal. He denies foot drop. He is anxious, scared and very fatigued w/ significant low back pain.  He states  I am not ready to die or give up.  He will be admitted for pain management and chemotherapy.[ME.3]         Past Surgical History:   Procedure Laterality Date   ??????? BONE MARROW BIOPSY     ??????? BONE MARROW TRANSPLANT     ??????? OTHER SURGICAL HISTORY Left 08/17/2016    trifusion cath placement   ??????? PRE-MALIGNANT / BENIGN SKIN LESION EXCISION Left 03/29/2017    EXCISE LESION LEFT EXTERNAL EAR WITH FROZEN SECTION, FULL THICKNESS SKIN GRAFT       Past Medical History:   Diagnosis Date   ??????? Bone pain    ??????? Hyperlipidemia    ??????? Leukemia, plasma cell, in relapse (HCC)    ??????? Low back pain    ??????? Multiple myeloma (HCC)    ??????? Neuropathy     chemo induced, feet       Prior to Admission medications    Medication Sig Start Date End Date Taking? Authorizing Provider   dexamethasone (DECADRON) 4 MG tablet Take 4 mg by mouth 3 times daily   Yes Historical Provider, MD   Fluticasone-Umeclidin-Vilant (TRELEGY ELLIPTA) 100-62.5-25 MCG/INH AEPB Inhale 1 puff into the  lungs daily 01/11/18  Yes Historical Provider, MD   fentaNYL (DURAGESIC) 25 MCG/HR Place 1 patch onto the skin every 72 hours.   Yes Historical Provider, MD   lidocaine-prilocaine (EMLA) 2.5-2.5 % cream Apply topically as needed for Pain Apply topically as needed.   Yes Historical Provider, MD   albuterol sulfate HFA (PROAIR HFA) 108 (90 Base) MCG/ACT inhaler Inhale 2 puffs into the lungs every 6 hours as needed for Wheezing   Yes Historical Provider, MD   calcium carbonate (OSCAL) 500 MG TABS tablet Take 500 mg by mouth 2 times daily    Yes Historical Provider, MD   oxyCODONE-acetaminophen (PERCOCET) 7.5-325 MG per tablet Take 1 tablet by mouth every 4 hours as needed for Pain..   Yes Historical Provider, MD   ondansetron (ZOFRAN) 4 MG tablet Take 1 tablet by mouth every 6 hours as needed for Nausea or Vomiting 05/29/17  Yes Terressa Koyanagi, MD   sertraline (ZOLOFT) 50 MG tablet Take 50 mg by mouth daily   Yes Historical Provider, MD   gabapentin (NEURONTIN) 300 MG capsule Take 1 capsule by mouth 3 times daily for 30 days.. 05/01/17 01/12/18 Yes Lisette Grinder, MD   pantoprazole (PROTONIX) 40 MG tablet Take 1 tablet by mouth every morning (before breakfast) 05/01/17  Yes Lisette Grinder, MD   prochlorperazine (COMPAZINE) 10 MG tablet Take 1 tablet by mouth every 6 hours as needed (Nausea) 05/01/17  Yes Lisette Grinder, MD   atorvastatin (LIPITOR) 10 MG tablet Take 10 mg by mouth nightly    Yes Historical Provider, MD   valACYclovir (VALTREX) 500 MG tablet Take 500 mg by mouth 2 times daily   Yes Historical Provider, MD       Allergies   Allergen Reactions   ??????? Lenalidomide Rash   ??????? Pomalidomide Hives   ??????? Ceclor [Cefaclor] Hives       Family History   Problem Relation Age of Onset   ??????? Heart Disease Mother    ??????? Lung Cancer Father    ??????? Heart Attack Brother         Social History     Socioeconomic History   ??????? Marital status: Single     Spouse name: Not on file   ??????? Number of children: Not  on file   ??????? Years of education: Not on file   ??????? Highest education level: Not on file   Occupational History   ??????? Not on file   Social Needs   ??????? Financial resource strain: Not on file   ??????? Food insecurity:     Worry: Not on file     Inability: Not on file   ??????? Transportation needs:     Medical: Not on file     Non-medical: Not on file   Tobacco Use   ??????? Smoking status: Former Smoker     Last attempt to quit: 11/25/2012     Years since quitting: 5.1   ??????? Smokeless tobacco: Never Used   Substance and Sexual Activity   ??????? Alcohol use: Yes     Comment: socially   ??????? Drug use: No   ??????? Sexual activity: Not on file   Lifestyle   ??????? Physical activity:     Days per week: Not on file     Minutes per session: Not on file   ??????? Stress: Not on file   Relationships   ??????? Social connections:     Talks on phone: Not on file     Gets together: Not on file     Attends religious service: Not on file     Active member of club or organization: Not on file     Attends meetings of clubs or organizations: Not on file     Relationship status: Not on file   ??????? Intimate partner violence:     Fear of current or ex partner: Not on file     Emotionally abused: Not on file     Physically abused: Not on file     Forced sexual activity: Not on file   Other Topics Concern   ??????? Not on file   Social History Narrative   ??????? Not on file        ROS:  As noted above, otherwise remainder of 10-point ROS negative    Physical Exam:     Vital Signs:  BP 138/88    Pulse 100    Temp 97.6 ????F (36.4 ????C) (Oral)    Resp 18    Ht 5' 9 (1.753 m)    Wt 191 lb 6.4 oz (86.8 kg)    SpO2 96%    BMI 28.26 kg/m????     Weight:    Wt Readings from Last 3 Encounters:   01/13/18 191 lb 6.4 oz (86.8 kg)   01/10/18 194 lb 12.8 oz (88.4 kg)   12/06/17 195 lb (88.5 kg)         General: Awake, alert and oriented.  HEENT: normocephalic,[ME.1] alopecia,[ME.4] PERRL, no scleral erythema or icterus, Oral mucosa moist and intact,[ME.1] thrush in posterior pharynx[ME.4]    LYMPH:  Left axillary lymphadenopathy[ME.3]   NECK: supple without palpable adenopathy  BACK: Straight negative CVAT  SKIN: warm dry and intact without lesions rashes or masses  CHEST:[ME.1] Diffuse rhonchi w/ expiratory wheeze,[ME.4] without use of accessory muscles  CV: Normal S1 S2, RRR, no MRG  ABD: NT ND normoactive BS, no palpable masses or hepatosplenomegaly  EXTREMITIES: without edema, denies calf tenderness  NEURO: CN II - XII grossly intact  CATHETER:[ME.1] Right IJ chest Port (06/21/17, [facility]) - CDI[ME.2]    Laboratory Data:   CBC:   Recent Labs     01/12/18 1625 01/13/18 0235   WBC 11.5* 10.6   HGB 10.9* 10.0*   HCT 34.3*  30.3*   MCV 95.0 94.7   PLT 125* 122*     BMP/Mag:  Recent Labs     01/12/18 1625 01/13/18 0235   NA 134* 134*   K 4.8 4.7   CL 98* 97*   CO2 24 24   PHOS  --  4.6   BUN 32* 33*   CREATININE 0.8* 0.8*   MG  --  2.00     LIVP:   Recent Labs     01/13/18 0235   AST 31   ALT 40   BILIDIR <0.2   BILITOT 0.3   ALKPHOS 68     Coags:   Recent Labs     01/12/18 1624 01/13/18 0235   PROTIME 12.7 12.4   INR 1.11 1.09   APTT  --  24.1*     Uric Acid   Recent Labs     01/13/18 0235   LABURIC 3.5     Diagnostics:  1.  PET scan (01/10/18):      Myeloma Labs (01/03/18):  SPEP:  reveals that M2, previously characterized as monoclonal IgG kappa in  mid-to-slow gamma is 1.0 gm/dL, greater than the 0.2 gm/dL observed on 08 Nov 1608.   SIFE:  Serum immunofixation electrophoresis reveals persistent M2 in mid-to-slow gamma, a monoclonal IgG kappa.  A recent M-spike, M1, was minor monoclonal IgG lambda in mid-gamma; M1 continues to be absent.  SFLC:  Kappa:  30.80, previously (11/08/17) - 10.40  Lambda:  1.46, previously (11/08/17) - 5.97  Free Kappa Lambda Ratio:  21.10, previously (11/08/17) - 1.74  Quantitative Immunoglobulins:  IgG:   1480, previously (11/08/17) - 552  IgA:   <26, previously (11/08/17) - 30  IgM:   <20    PROBLEM LIST: ????????????????????   ????????[ME.1]  1.[ME.2]  IgG Kappa Multiple[ME.1] Myeloma  /[ME.2] Plasma Cell Leukemia ([ME.1]Dx[ME.2] 03/2017)[ME.1]  2.[ME.2]  Peripheral neuropathy[ME.1]  3.  Anxiety/[ME.2]Depression[ME.1]  4.[ME.2]  Hyperlipidemia[ME.1]  5.[ME.2]  Hypertension[ME.1]  6.[ME.2]  Insomnia[ME.1]  7.[ME.2]  Chronic low back pain[ME.1] d/t neoplasm ([ME.2]h/o lytic lesions[ME.1] &[ME.2] cord compression[ME.1] @[ME.2] T6[ME.1] & T11)  8.  COPD  9.[ME.2]  Influenza A[ME.1] ([ME.2]10/12/17[ME.1])[ME.2]  ????????  TREATMENT: ????????????????????   ????  1. Rad Tx to T5-7, T10-L3, Right Scapula - 3000 cGy - Dr. Bonita Quin 03/20/16-04/01/16  2. RVD x1 03/20/16 - discontinued d/t rash   3. Velcade/Pomalyst/Dex x3 cycles 04/23/16-06/17/16 - discontinued d/t reaction to pomalyst  4. Velcade/Dex x 1 cycle 06/26/16 (last dose of dexamethasone 07/22/16  5. High-dose melphalan followed by administration of PBSCs 2.36 x10^6 cd34cells/kg on 08/28/16  6. Maintenance Revlimid 10mg  daily (12/2016-04/23/17)  7. Dexamethasone 40mg  daily (04/24/17)  8. DCEP   Cycle #1 - 04/26/17  Cycle #2 - 05/25/17 - excellent response  9. Dara/Velcade/Dex (C1D1 - 06/22/17) - BMBx ????10/18/17 - CR[ME.1]  Cycles 7-8 (21 day cycle)  - Dex 20 mg po D4,5.8,9,11 and 12  - Dara 16 mg/Kg D1 only with Dex 20 mg IV  - Velcade 1.3 mg/M2 D1,4,8 and 11  Cycle 9 and beyond (maintenance, 11/08/17[ME.5] - 01/03/18[ME.2])  Dara 16 mg/Kg Q 28 days  Dex 12 mg IV D1  Velcade Q 2 weeks[ME.5]   10. DCEP Cycle #1 - 01/13/18[ME.2]    ASSESSMENT AND PLAN:????????????????????   ????  1. ????IgG kappa multiple myeloma / Plasma Cell Leukemia:[ME.1] Relapsed disease[ME.3]   - S/p[ME.1] treatment[ME.2] Dara/Velcade/Dex[ME.1] x 8 cycles[ME.2] (06/22/17[ME.1] - 10/2017[ME.2])[ME.1]. Followed by[ME.2] maintenance Dara[ME.1] Q4 wks[ME.2]/Velcade[ME.1] Q2ks[ME.2]/Dex[ME.1] (started 11/08/17)  - Now w/  progressive disease based on myeloma labs (01/03/18) & PET scan (01/10/18), see above[ME.2]   ????  PLAN:[ME.1] DCEP for disease and pain control. Hope for allogeneic transplant in CR2, but needs to disease response and  improvement in lung function[ME.2]   ????  2. ????ID: Afebrile[ME.1], no signs of infection[ME.2]   - Cont Valtrex prophylaxis[ME.1]   - Start Diflucan 200 mg daily for thrush (01/13/18)[ME.3]  ????  3. Heme:[ME.1] A[ME.2]nemia d/t chemotherapy  - Transfuse for Hgb < 7 and Platelets <[ME.1] 1[ME.6]0K  - No transfusion today  ????  4. Metabolic: Stable renal fxn and e-lytes[ME.1] except for hypoNa &[ME.2] steroid-induced hyperglycemia[ME.1]   - Start IVF:  NS @ 100 mL/hr  - Replace Mg and K+ per protocol[ME.2]     5. Pulmonary:[ME.1] No acute issues, but currently on steroid taper for COPD per Dr. Tanda Rockers  - Pulm Nodule: Stable[ME.1] on[ME.2] CT chest[ME.1]  From[ME.2] 04/23/17[ME.1] & 10/18/17 & 11/26/17[ME.6]; cont to monitor.   - CTPA[ME.1] ([ME.2]11/26/17[ME.1])[ME.2]: No PE; resolution of mild upper lobe tree-in-bud opacities; Mild emphysema. Two 3 mm LLL pulmonary nodules, stable[ME.1]  - PFT (12/27/17):  compared to 07/2016: FVC 3.25 L, 66%, and FEV1  1.42 L, 38%. FEV1/FVC is 44%. This is unchanged from prior study. Air trapping seen on lung volumes. Diffusion capacity 3.91, 79% of predicted, down from 5.21[ME.2]   - Follows up with Dr. Maryelizabeth Rowan for COPD[ME.2]  - Cont[ME.1] Dex 4 mg Q8hrs (started by [facility])[ME.6]  - Cont Albuterol inhaler prn & home nebulizer (Rx 10/12/17)[ME.1]  - Consult [facility] for[ME.6] COPD management[ME.2]   ????  6. GI/Nutrition: Appetite and oral intake is good[ME.1]   - Cont PPI ppx w/ steroids[ME.2]   - Cont low microbial diet ????[ME.1]  - Dietary to follow[ME.6]   ????  7. Cardiac:[ME.1] No acute issues   - H[ME.6]/o HLD & has septal wall defect on echocardiogram.  - Echo (07/13/16): abnormal (paradoxical) septal motion is present with preserved LVEF 55-60%.  - Cont Lipitor   ????  8. Anxiety/Depression: Ongoing  - Cont Zoloft[ME.1] 50 mg daily[ME.6]   - Cont Ativan PRN  - Psych to follow as needed   ????  9. Insomnia: ongoing[ME.1]  - He[ME.2] has failed multiple sleep aids in the past.  ????[ME.1]  -[ME.2] No complaints[ME.1] currently[ME.2]   ????  10. Peripheral Neuropathy: Ongoing  - Cont Gabapentin 300 mg[ME.1] TID[ME.6]  ????  11. 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  12.[ME.1] Acute on[ME.6] Chronic left lower back pain:[ME.1]  d/t Neoplasm and relapsed/progressive disease[ME.6]   - S/p Radiation ([ME.1]09/01/17 - 09/15/17,[ME.2] Fried)[ME.1]  - Increase Fentanyl patch[ME.6] to[ME.2] 50 mcg/hr[ME.6] (increased 01/13/18)[ME.2]  - Increase Oxycodone 10 mg q2hrs prn[ME.6] (increased 01/13/18)  - Stop IV Morphine 4 mg prn[ME.2]      - DVT Prophylaxis: Platelets >50,000 cells/dL, - daily lovenox prophylaxis ordered  Contraindications to pharmacologic prophylaxis: None  Contraindications to mechanical prophylaxis: None      - Disposition: Once pain controlled[ME.1] and chemo completes w/out toxicity[ME.6]     The patient was seen and examined by Dr. Lynnette Caffey. This admission history and physical has been discussed and agreed upon by Dr. Lynnette Caffey.    Awilda Metro, APRN - CNP[ME.1]  Emilee Hero, MD  [facility]  Please contact me through [employer][JE.1]  Attribution Key   JE.1 - Emilee Hero, MD on 01/13/2018 10:15 PM ME.1 - Awilda Metro, APRN - CNP on 01/13/2018  8:29 AM ME.2 - Awilda Metro, APRN - CNP on 01/13/2018  1:38 PM ME.3 - Awilda Metro, APRN - CNP on 01/13/2018  3:16 PM ME.4 - Awilda Metro, APRN - CNP on 01/13/2018  3:10 PM ME.5 - Awilda Metro, APRN - CNP on 01/13/2018  9:43 AM ME.6 - Awilda Metro, APRN - CNP on 01/13/2018  9:53 AM             H&P by Awilda Metro, APRN - CNP at 01/13/2018  8:29 AM  Version 1 of 2    Author:  Awilda Metro, APRN - CNP Service:  Hematology Oncology Author Type:  Nurse Practitioner    Filed:  01/13/2018  3:22 PM Date  of Service:  01/13/2018  8:29 AM Status:  Cosign Needed    Editor:  Awilda Metro, APRN - CNP (Nurse Practitioner)    Related Notes:  Addendum by Emilee Hero, MD (Physician) filed at 01/13/2018 10:15 PM    Cosign Required:  Yes                                     BCC History and Physical       Attending Physician: Emilee Hero, MD    Primary Care: Emilee Hero, MD       Name: VICTORHUGO PREIS DOB:  Sep 22, 1955  MRN:  [id number]    Admission: 01/12/2018      Date: 01/13/2018    Reason for Admission: Uncontrolled Pain d/t neoplasm     History of Present Illness:[ME.1]     Derek Benson is a 62 yo male w/ 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  His PMH is also significant for  HTN, HLD, anxiety/depression, COPD & peripheral neuropathy. He is most recently been treated with bimonthly velcade &  monthly   daratumumab w/ Dexamethasone for his relapsed MM (last tx 01/03/18). He has been following closely w/ Dr. Karie Kirks for COPD and recently been on a steroid taper for COPD exacerbation.     He recently underwent repeat myeloma labs (01/03/18) that show progressive disease and a PET scan (01/10/18) that showed marked progression w/ multiple new osseous lesions.  There is a paraspinal soft tissue mass along the lower thoracic & upper lumbar   spine.  There are new enlarged lymph nodes in the left axillary & subpectoral region.  He has been complaining of increased back pain and on 01/05/18 his Fentanyl patch was increased from 12.5 mcg/hr to 25 mcg/hr and Pecocet dose was increased.  He[ME.2]   was[ME.3] then called [facility][ME.2] (01/12/18) w/ the PET scan results.  He was assessed and advised to report to the ED because of increased pain[ME.3], rating 9-10.  His pain is on the left side from the ribcage to waist / spine area.  He denies bowel and   bladder dysfunction and his leg strength is not strong because I have not been walking around much.  He was given Percocet and IV Morphine for management and his pain  was slightly improved by the following morning.[ME.2] Today he c/o increased   shortness of breath, but less cough, increased fatigue, decreased appetite and weight loss.  He denies chest pain, headaches, visual  changes or mouth pain. He denies increased numbness or lack of feeling in his lower extremities and states his bowel and   bladder function is normal. He denies foot drop. He is anxious, scared and very fatigued w/ significant low back pain.  He states  I am not ready to die or give up.  He will be admitted for pain management and chemotherapy.[ME.3]         Past Surgical History:   Procedure Laterality Date   ??????? BONE MARROW BIOPSY     ??????? BONE MARROW TRANSPLANT     ??????? OTHER SURGICAL HISTORY Left 08/17/2016    trifusion cath placement   ??????? PRE-MALIGNANT / BENIGN SKIN LESION EXCISION Left 03/29/2017    EXCISE LESION LEFT EXTERNAL EAR WITH FROZEN SECTION, FULL THICKNESS SKIN GRAFT       Past Medical History:   Diagnosis Date   ??????? Bone pain    ??????? Hyperlipidemia    ??????? Leukemia, plasma cell, in relapse (HCC)    ??????? Low back pain    ??????? Multiple myeloma (HCC)    ??????? Neuropathy     chemo induced, feet       Prior to Admission medications    Medication Sig Start Date End Date Taking? Authorizing Provider   dexamethasone (DECADRON) 4 MG tablet Take 4 mg by mouth 3 times daily   Yes Historical Provider, MD   Fluticasone-Umeclidin-Vilant (TRELEGY ELLIPTA) 100-62.5-25 MCG/INH AEPB Inhale 1 puff into the lungs daily 01/11/18  Yes Historical Provider, MD   fentaNYL (DURAGESIC) 25 MCG/HR Place 1 patch onto the skin every 72 hours.   Yes Historical Provider, MD   lidocaine-prilocaine (EMLA) 2.5-2.5 % cream Apply topically as needed for Pain Apply topically as needed.   Yes Historical Provider, MD   albuterol sulfate HFA (PROAIR HFA) 108 (90 Base) MCG/ACT inhaler Inhale 2 puffs into the lungs every 6 hours as needed for Wheezing   Yes Historical Provider, MD   calcium carbonate (OSCAL) 500 MG TABS tablet Take 500 mg  by mouth 2 times daily    Yes Historical Provider, MD   oxyCODONE-acetaminophen (PERCOCET) 7.5-325 MG per tablet Take 1 tablet by mouth every 4 hours as needed for Pain..   Yes Historical Provider, MD   ondansetron (ZOFRAN) 4 MG tablet Take 1 tablet by mouth every 6 hours as needed for Nausea or Vomiting 05/29/17  Yes Terressa Koyanagi, MD   sertraline (ZOLOFT) 50 MG tablet Take 50 mg by mouth daily   Yes Historical Provider, MD   gabapentin (NEURONTIN) 300 MG capsule Take 1 capsule by mouth 3 times daily for 30 days.. 05/01/17 01/12/18 Yes Lisette Grinder, MD   pantoprazole (PROTONIX) 40 MG tablet Take 1 tablet by mouth every morning (before breakfast) 05/01/17  Yes Lisette Grinder, MD   prochlorperazine (COMPAZINE) 10 MG tablet Take 1 tablet by mouth every 6 hours as needed (Nausea) 05/01/17  Yes Lisette Grinder, MD   atorvastatin (LIPITOR) 10 MG tablet Take 10 mg by mouth nightly    Yes Historical Provider, MD   valACYclovir (VALTREX) 500 MG tablet Take 500 mg by mouth 2 times daily   Yes Historical Provider, MD       Allergies   Allergen Reactions   ??????? Lenalidomide Rash   ??????? Pomalidomide Hives   ??????? Ceclor [Cefaclor] Hives       Family History   Problem Relation Age of Onset   ??????? Heart Disease Mother    ??????? Lung  Cancer Father    ??????? Heart Attack Brother         Social History     Socioeconomic History   ??????? Marital status: Single     Spouse name: Not on file   ??????? Number of children: Not on file   ??????? Years of education: Not on file   ??????? Highest education level: Not on file   Occupational History   ??????? Not on file   Social Needs   ??????? Financial resource strain: Not on file   ??????? Food insecurity:     Worry: Not on file     Inability: Not on file   ??????? Transportation needs:     Medical: Not on file     Non-medical: Not on file   Tobacco Use   ??????? Smoking status: Former Smoker     Last attempt to quit: 11/25/2012     Years since quitting: 5.1   ??????? Smokeless tobacco: Never Used   Substance and  Sexual Activity   ??????? Alcohol use: Yes     Comment: socially   ??????? Drug use: No   ??????? Sexual activity: Not on file   Lifestyle   ??????? Physical activity:     Days per week: Not on file     Minutes per session: Not on file   ??????? Stress: Not on file   Relationships   ??????? Social connections:     Talks on phone: Not on file     Gets together: Not on file     Attends religious service: Not on file     Active member of club or organization: Not on file     Attends meetings of clubs or organizations: Not on file     Relationship status: Not on file   ??????? Intimate partner violence:     Fear of current or ex partner: Not on file     Emotionally abused: Not on file     Physically abused: Not on file     Forced sexual activity: Not on file   Other Topics Concern   ??????? Not on file   Social History Narrative   ??????? Not on file        ROS:  As noted above, otherwise remainder of 10-point ROS negative    Physical Exam:     Vital Signs:  BP 138/88    Pulse 100    Temp 97.6 ????F (36.4 ????C) (Oral)    Resp 18    Ht 5' 9 (1.753 m)    Wt 191 lb 6.4 oz (86.8 kg)    SpO2 96%    BMI 28.26 kg/m????     Weight:    Wt Readings from Last 3 Encounters:   01/13/18 191 lb 6.4 oz (86.8 kg)   01/10/18 194 lb 12.8 oz (88.4 kg)   12/06/17 195 lb (88.5 kg)         General: Awake, alert and oriented.  HEENT: normocephalic,[ME.1] alopecia,[ME.4] PERRL, no scleral erythema or icterus, Oral mucosa moist and intact,[ME.1] thrush in posterior pharynx[ME.4]   LYMPH:  Left axillary lymphadenopathy[ME.3]   NECK: supple without palpable adenopathy  BACK: Straight negative CVAT  SKIN: warm dry and intact without lesions rashes or masses  CHEST:[ME.1] Diffuse rhonchi w/ expiratory wheeze,[ME.4] without use of accessory muscles  CV: Normal S1 S2, RRR, no MRG  ABD: NT ND normoactive BS, no palpable masses or hepatosplenomegaly  EXTREMITIES: without edema, denies calf tenderness  NEURO: CN II -  XII grossly intact  CATHETER:[ME.1] Right IJ chest Port (06/21/17, [facility])  - CDI[ME.2]    Laboratory Data:   CBC:   Recent Labs     01/12/18 1625 01/13/18 0235   WBC 11.5* 10.6   HGB 10.9* 10.0*   HCT 34.3* 30.3*   MCV 95.0 94.7   PLT 125* 122*     BMP/Mag:  Recent Labs     01/12/18 1625 01/13/18 0235   NA 134* 134*   K 4.8 4.7   CL 98* 97*   CO2 24 24   PHOS  --  4.6   BUN 32* 33*   CREATININE 0.8* 0.8*   MG  --  2.00     LIVP:   Recent Labs     01/13/18 0235   AST 31   ALT 40   BILIDIR <0.2   BILITOT 0.3   ALKPHOS 68     Coags:   Recent Labs     01/12/18 1624 01/13/18 0235   PROTIME 12.7 12.4   INR 1.11 1.09   APTT  --  24.1*     Uric Acid   Recent Labs     01/13/18 0235   LABURIC 3.5     Diagnostics:  1.  PET scan (01/10/18):      Myeloma Labs (01/03/18):  SPEP:  reveals that M2, previously characterized as monoclonal IgG kappa in  mid-to-slow gamma is 1.0 gm/dL, greater than the 0.2 gm/dL observed on 08 Nov 1608.   SIFE:  Serum immunofixation electrophoresis reveals persistent M2 in mid-to-slow gamma, a monoclonal IgG kappa.  A recent M-spike, M1, was minor monoclonal IgG lambda in mid-gamma; M1 continues to be absent.  SFLC:  Kappa:  30.80, previously (11/08/17) - 10.40  Lambda:  1.46, previously (11/08/17) - 5.97  Free Kappa Lambda Ratio:  21.10, previously (11/08/17) - 1.74  Quantitative Immunoglobulins:  IgG:   1480, previously (11/08/17) - 552  IgA:   <26, previously (11/08/17) - 30  IgM:   <20    PROBLEM LIST: ????????????????????   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  Anxiety/[ME.2]Depression[ME.1]  4.[ME.2]  Hyperlipidemia[ME.1]  5.[ME.2]  Hypertension[ME.1]  6.[ME.2]  Insomnia[ME.1]  7.[ME.2]  Chronic low back pain[ME.1] d/t neoplasm ([ME.2]h/o lytic lesions[ME.1] &[ME.2] cord compression[ME.1] @[ME.2] T6[ME.1] & T11)  8.  COPD  9.[ME.2]  Influenza A[ME.1] ([ME.2]10/12/17[ME.1])[ME.2]  ????????  TREATMENT: ????????????????????   ????  1. Rad Tx to T5-7, T10-L3, Right Scapula - 3000 cGy - Dr.  Bonita Quin 03/20/16-04/01/16  2. RVD x1 03/20/16 - discontinued d/t rash   3. Velcade/Pomalyst/Dex x3 cycles 04/23/16-06/17/16 - discontinued d/t reaction to pomalyst  4. Velcade/Dex x 1 cycle 06/26/16 (last dose of dexamethasone 07/22/16  5. High-dose melphalan followed by administration of PBSCs 2.36 x10^6 cd34cells/kg on 08/28/16  6. Maintenance Revlimid 10mg  daily (12/2016-04/23/17)  7. Dexamethasone 40mg  daily (04/24/17)  8. DCEP   Cycle #1 - 04/26/17  Cycle #2 - 05/25/17 - excellent response  9. Dara/Velcade/Dex (C1D1 - 06/22/17) - BMBx ????10/18/17 - CR[ME.1]  Cycles 7-8 (21 day cycle)  - Dex 20 mg po D4,5.8,9,11 and 12  - Dara 16 mg/Kg D1 only with Dex 20 mg IV  - Velcade 1.3 mg/M2 D1,4,8 and 11  Cycle 9 and beyond (maintenance, 11/08/17[ME.5] - 01/03/18[ME.2])  Dara 16 mg/Kg Q 28 days  Dex 12 mg IV D1  Velcade Q 2 weeks[ME.5]   10. DCEP Cycle #1 -  01/13/18[ME.2]    ASSESSMENT AND PLAN:????????????????????   ????  1. ????IgG kappa multiple myeloma / Plasma Cell Leukemia:[ME.1] Relapsed disease[ME.3]   - S/p[ME.1] treatment[ME.2] Dara/Velcade/Dex[ME.1] x 8 cycles[ME.2] (06/22/17[ME.1] - 10/2017[ME.2])[ME.1]. Followed by[ME.2] maintenance Dara[ME.1] Q4 wks[ME.2]/Velcade[ME.1] Q2ks[ME.2]/Dex[ME.1] (started 11/08/17)  - Now w/ progressive disease based on myeloma labs (01/03/18) & PET scan (01/10/18), see above[ME.2]   ????  PLAN:[ME.1] DCEP for disease and pain control. Hope for allogeneic transplant in CR2, but needs to disease response and improvement in lung function[ME.2]   ????  2. ????ID: Afebrile[ME.1], no signs of infection[ME.2]   - Cont Valtrex prophylaxis[ME.1]   - Start Diflucan 200 mg daily for thrush (01/13/18)[ME.3]  ????  3. Heme:[ME.1] A[ME.2]nemia d/t chemotherapy  - Transfuse for Hgb < 7 and Platelets <[ME.1] 1[ME.6]0K  - No transfusion today  ????  4. Metabolic: Stable renal fxn and e-lytes[ME.1] except for hypoNa &[ME.2] steroid-induced hyperglycemia[ME.1]   - Start IVF:  NS @ 100 mL/hr  - Replace Mg and K+ per protocol[ME.2]     5.  Pulmonary:[ME.1] No acute issues, but currently on steroid taper for COPD per Dr. Tanda Rockers  - Pulm Nodule: Stable[ME.1] on[ME.2] CT chest[ME.1]  From[ME.2] 04/23/17[ME.1] & 10/18/17 & 11/26/17[ME.6]; cont to monitor.   - CTPA[ME.1] ([ME.2]11/26/17[ME.1])[ME.2]: No PE; resolution of mild upper lobe tree-in-bud opacities; Mild emphysema. Two 3 mm LLL pulmonary nodules, stable[ME.1]  - PFT (12/27/17):  compared to 07/2016: FVC 3.25 L, 66%, and FEV1  1.42 L, 38%. FEV1/FVC is 44%. This is unchanged from prior study. Air trapping seen on lung volumes. Diffusion capacity 3.91, 79% of predicted, down from 5.21[ME.2]   - Follows up with Dr. Maryelizabeth Rowan for COPD[ME.2]  - Cont[ME.1] Dex 4 mg Q8hrs (started by [facility])[ME.6]  - Cont Albuterol inhaler prn & home nebulizer (Rx 10/12/17)[ME.1]  - Consult [facility] for[ME.6] COPD management[ME.2]   ????  6. GI/Nutrition: Appetite and oral intake is good[ME.1]   - Cont PPI ppx w/ steroids[ME.2]   - Cont low microbial diet ????[ME.1]  - Dietary to follow[ME.6]   ????  7. Cardiac:[ME.1] No acute issues   - H[ME.6]/o HLD & has septal wall defect on echocardiogram.  - Echo (07/13/16): abnormal (paradoxical) septal motion is present with preserved LVEF 55-60%.  - Cont Lipitor   ????  8. Anxiety/Depression: Ongoing  - Cont Zoloft[ME.1] 50 mg daily[ME.6]   - Cont Ativan PRN  - Psych to follow as needed   ????  9. Insomnia: ongoing[ME.1]  - He[ME.2] has failed multiple sleep aids in the past. ????[ME.1]  -[ME.2] No complaints[ME.1] currently[ME.2]   ????  10. Peripheral Neuropathy: Ongoing  - Cont Gabapentin 300 mg[ME.1] TID[ME.6]  ????  11. 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  12.[ME.1] Acute on[ME.6] Chronic left lower back pain:[ME.1]  d/t Neoplasm and  relapsed/progressive disease[ME.6]   - S/p Radiation ([ME.1]09/01/17 - 09/15/17,[ME.2] Fried)[ME.1]  - Increase Fentanyl patch[ME.6] to[ME.2] 50 mcg/hr[ME.6] (increased 01/13/18)[ME.2]  - Increase Oxycodone 10 mg q2hrs prn[ME.6] (increased 01/13/18)  - Stop IV Morphine 4 mg prn[ME.2]      - DVT Prophylaxis: Platelets >50,000 cells/dL, - daily lovenox prophylaxis ordered  Contraindications to pharmacologic prophylaxis: None  Contraindications to mechanical prophylaxis: None      - Disposition: Once pain controlled[ME.1] and chemo completes w/out toxicity[ME.6]  The patient was seen and examined by Dr. Lynnette Caffey. This admission history and physical has been discussed and agreed upon by Dr. Lynnette Caffey.    Awilda Metro, APRN - CNP[ME.1]        Attribution Key   ME.1 - Awilda Metro, APRN - CNP on 01/13/2018  8:29 AM ME.2 - Awilda Metro, APRN - CNP on 01/13/2018  1:38 PM ME.3 - Awilda Metro, APRN - CNP on 01/13/2018  3:16 PM ME.4 - Awilda Metro, APRN - CNP on 01/13/2018  3:10 PM ME.5 -   Awilda Metro, APRN - CNP on 01/13/2018  9:43 AM ME.6 - Awilda Metro, APRN - CNP on 01/13/2018  9:53 AM

## 2018-01-13 NOTE — H&P (Signed)
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  His PMH is also significant for  HTN, HLD, anxiety/depression, COPD & peripheral neuropathy. He is most recently been treated with bimonthly velcade &  monthly daratumumab w/ Dexamethasone for his relapsed MM (last tx 01/03/18). He has been following closely w/ Dr. Dorann Lodge for COPD and recently been on a steroid taper for COPD exacerbation.     He recently underwent repeat myeloma labs (01/03/18) that show progressive disease and a PET scan (01/10/18) that showed marked progression w/ multiple new osseous lesions.  There is a paraspinal soft tissue mass along the lower thoracic & upper lumbar spine.  There are new enlarged lymph nodes in the left axillary & subpectoral region.  He has been complaining of increased back pain and on 01/05/18 his Fentanyl patch was increased from 12.5 mcg/hr to 25 mcg/hr and Pecocet dose was increased.  He was then called [facility] (01/12/18) w/ the PET scan results.  He was assessed and advised to report to the ED because of increased pain, rating 9-10.  His pain is on the left side from the ribcage to waist / spine area.  He denies bowel and bladder dysfunction and his leg strength is "not strong because I have not been walking around much".  He was given Percocet and IV Morphine for management and his pain was slightly improved by the following morning. Today he c/o increased shortness of breath, but less cough, increased fatigue, decreased appetite and weight loss.  He denies chest pain, headaches, visual changes or mouth pain. He denies increased numbness or lack of feeling in  his lower extremities and states his bowel and bladder function is normal. He denies foot drop. He is anxious, scared and very fatigued w/ significant low back pain.  He states " I am not ready to die or give up."  He will be admitted for pain management and chemotherapy.         Past Surgical History:   Procedure Laterality Date   ??? BONE MARROW BIOPSY     ??? BONE MARROW TRANSPLANT     ??? OTHER SURGICAL HISTORY Left 08/17/2016    trifusion cath placement   ??? PRE-MALIGNANT / BENIGN SKIN LESION EXCISION Left 03/29/2017    EXCISE LESION LEFT EXTERNAL EAR WITH FROZEN SECTION, FULL THICKNESS SKIN GRAFT       Past Medical History:   Diagnosis Date   ??? Bone pain    ??? Hyperlipidemia    ??? Leukemia, plasma cell, in relapse ([place])    ??? Low back pain    ??? Multiple myeloma ([place])    ??? Neuropathy     chemo induced, feet       Prior to Admission medications    Medication Sig Start Date End Date Taking? Authorizing Provider   dexamethasone (DECADRON) 4 MG tablet Take 4 mg by mouth 3 times daily   Yes Historical Provider, MD   Fluticasone-Umeclidin-Vilant (TRELEGY ELLIPTA) 100-62.5-25 MCG/INH AEPB Inhale 1 puff into  the lungs daily 01/11/18  Yes Historical Provider, MD   fentaNYL (DURAGESIC) 25 MCG/HR Place 1 patch onto the skin every 72 hours.   Yes Historical Provider, MD   lidocaine-prilocaine (EMLA) 2.5-2.5 % cream Apply topically as needed for Pain Apply topically as needed.   Yes Historical Provider, MD   albuterol sulfate HFA (PROAIR HFA) 108 (90 Base) MCG/ACT inhaler Inhale 2 puffs into the lungs every 6 hours as needed for Wheezing   Yes Historical Provider, MD   calcium carbonate (OSCAL) 500 MG TABS tablet Take 500 mg by mouth 2 times daily    Yes Historical Provider, MD   oxyCODONE-acetaminophen (PERCOCET) 7.5-325 MG per tablet Take 1 tablet by mouth every 4 hours as needed for Pain..   Yes Historical Provider, MD   ondansetron (ZOFRAN) 4 MG tablet Take 1 tablet by mouth every 6 hours as needed for Nausea or Vomiting 05/29/17   Yes Marianne Sofia, MD   sertraline (ZOLOFT) 50 MG tablet Take 50 mg by mouth daily   Yes Historical Provider, MD   gabapentin (NEURONTIN) 300 MG capsule Take 1 capsule by mouth 3 times daily for 30 days.. 05/01/17 01/12/18 Yes Jannette Spanner, MD   pantoprazole (PROTONIX) 40 MG tablet Take 1 tablet by mouth every morning (before breakfast) 05/01/17  Yes Jannette Spanner, MD   prochlorperazine (COMPAZINE) 10 MG tablet Take 1 tablet by mouth every 6 hours as needed (Nausea) 05/01/17  Yes Jannette Spanner, MD   atorvastatin (LIPITOR) 10 MG tablet Take 10 mg by mouth nightly    Yes Historical Provider, MD   valACYclovir (VALTREX) 500 MG tablet Take 500 mg by mouth 2 times daily   Yes Historical Provider, MD       Allergies   Allergen Reactions   ??? Lenalidomide Rash   ??? Pomalidomide Hives   ??? Ceclor [Cefaclor] Hives       Family History   Problem Relation Age of Onset   ??? Heart Disease Mother    ??? Lung Cancer Father    ??? Heart Attack Brother         Social History     Socioeconomic History   ??? Marital status: Single     Spouse name: Not on file   ??? Number of children: Not on file   ??? Years of education: Not on file   ??? Highest education level: Not on file   Occupational History   ??? Not on file   Social Needs   ??? Financial resource strain: Not on file   ??? Food insecurity:     Worry: Not on file     Inability: Not on file   ??? Transportation needs:     Medical: Not on file     Non-medical: Not on file   Tobacco Use   ??? Smoking status: Former Smoker     Last attempt to quit: 11/25/2012     Years since quitting: 5.1   ??? Smokeless tobacco: Never Used   Substance and Sexual Activity   ??? Alcohol use: Yes     Comment: socially   ??? Drug use: No   ??? Sexual activity: Not on file   Lifestyle   ??? Physical activity:     Days per week: Not on file     Minutes per session: Not on file   ??? Stress: Not on file   Relationships   ??? Social connections:     Talks on phone: Not on file  Gets together: Not on file     Attends  religious service: Not on file     Active member of club or organization: Not on file     Attends meetings of clubs or organizations: Not on file     Relationship status: Not on file   ??? Intimate partner violence:     Fear of current or ex partner: Not on file     Emotionally abused: Not on file     Physically abused: Not on file     Forced sexual activity: Not on file   Other Topics Concern   ??? Not on file   Social History Narrative   ??? Not on file        ROS:  As noted above, otherwise remainder of 10-point ROS negative    Physical Exam:     Vital Signs:  BP 138/88    Pulse 100    Temp 97.6 ??F (36.4 ??C) (Oral)    Resp 18    Ht 5' 9"  (1.753 m)    Wt 191 lb 6.4 oz (86.8 kg)    SpO2 96%    BMI 28.26 kg/m??     Weight:    Wt Readings from Last 3 Encounters:   01/13/18 191 lb 6.4 oz (86.8 kg)   01/10/18 194 lb 12.8 oz (88.4 kg)   12/06/17 195 lb (88.5 kg)         General: Awake, alert and oriented.  HEENT: normocephalic, alopecia, PERRL, no scleral erythema or icterus, Oral mucosa moist and intact, thrush in posterior pharynx   LYMPH:  Left axillary lymphadenopathy   NECK: supple without palpable adenopathy  BACK: Straight negative CVAT  SKIN: warm dry and intact without lesions rashes or masses  CHEST: Diffuse rhonchi w/ expiratory wheeze, without use of accessory muscles  CV: Normal S1 S2, RRR, no MRG  ABD: NT ND normoactive BS, no palpable masses or hepatosplenomegaly  EXTREMITIES: without edema, denies calf tenderness  NEURO: CN II - XII grossly intact  CATHETER: Right IJ chest Port (06/21/17, [facility]) - CDI    Laboratory Data:   CBC:   Recent Labs     01/12/18  1625 01/13/18  0235   WBC 11.5* 10.6   HGB 10.9* 10.0*   HCT 34.3* 30.3*   MCV 95.0 94.7   PLT 125* 122*     BMP/Mag:  Recent Labs     01/12/18  1625 01/13/18  0235   NA 134* 134*   K 4.8 4.7   CL 98* 97*   CO2 24 24   PHOS  --  4.6   BUN 32* 33*   CREATININE 0.8* 0.8*   MG  --  2.00     LIVP:   Recent Labs     01/13/18  0235   AST 31   ALT 40   BILIDIR  <0.2   BILITOT 0.3   ALKPHOS 68     Coags:   Recent Labs     01/12/18  1624 01/13/18  0235   PROTIME 12.7 12.4   INR 1.11 1.09   APTT  --  24.1*     Uric Acid   Recent Labs     01/13/18  0235   LABURIC 3.5     Diagnostics:  1.  PET scan (01/10/18):      Myeloma Labs (01/03/18):  SPEP:  reveals that M2, previously characterized as monoclonal IgG kappa in  mid-to-slow gamma is 1.0 gm/dL, greater than  the 0.2 gm/dL observed on 08 Nov 2017.   SIFE:  Serum immunofixation electrophoresis reveals persistent M2 in mid-to-slow gamma, a monoclonal IgG kappa.  A recent M-spike, M1, was minor monoclonal IgG lambda in mid-gamma; M1 continues to be absent.  SFLC:  Kappa:  30.80, previously (11/08/17) - 10.40  Lambda:  1.46, previously (11/08/17) - 5.97  Free Kappa Lambda Ratio:  21.10, previously (11/08/17) - 1.74  Quantitative Immunoglobulins:  IgG:   1480, previously (11/08/17) - 552  IgA:   <26, previously (11/08/17) - 30  IgM:   <20    PROBLEM LIST: ??????????   ????  1.  IgG Kappa Multiple Myeloma / Plasma Cell Leukemia (Dx 03/2017)  2.  Peripheral neuropathy  3.  Anxiety/Depression  4.  Hyperlipidemia  5.  Hypertension  6.  Insomnia  7.  Chronic low back pain d/t neoplasm (h/o lytic lesions & cord compression @ T6 & T11)  8.  COPD  9.  Influenza A (10/12/17)  ????  TREATMENT: ??????????   ??  1. Rad Tx to T5-7, T10-L3, Right Scapula - 3000 cGy - Dr. Jamas Lav 03/20/16-04/01/16  2. RVD x1 03/20/16 - discontinued d/t rash   3. Velcade/Pomalyst/Dex x3 cycles 04/23/16-06/17/16 - discontinued d/t reaction to pomalyst  4. Velcade/Dex x 1 cycle 06/26/16 (last dose of dexamethasone 07/22/16  5. High-dose melphalan followed by administration of PBSCs 2.36 x10^6 cd34cells/kg on 08/28/16  6. Maintenance Revlimid 10m daily (12/2016-04/23/17)  7. Dexamethasone 450mdaily (04/24/17)  8. DCEP   Cycle #1 - 04/26/17  Cycle #2 - 05/25/17 - excellent response  9. Dara/Velcade/Dex (C1D1 - 06/22/17) - BMBx ??10/18/17 - CR  Cycles 7-8 (21 day cycle)  - Dex 20 mg po D4,5.8,9,11 and  12  - Dara 16 mg/Kg D1 only with Dex 20 mg IV  - Velcade 1.3 mg/M2 D1,4,8 and 11  Cycle 9 and beyond (maintenance, 11/08/17 - 01/03/18)  Dara 16 mg/Kg Q 28 days  Dex 12 mg IV D1  Velcade Q 2 weeks   10. DCEP Cycle #1 - 01/13/18    ASSESSMENT AND PLAN:??????????   ??  1. ??IgG kappa multiple myeloma / Plasma Cell Leukemia: Relapsed disease   - S/p treatment Dara/Velcade/Dex x 8 cycles (06/22/17 - 10/2017). Followed by maintenance Dara Q4 wks/Velcade Q2ks/Dex (started 11/08/17)  - Now w/ progressive disease based on myeloma labs (01/03/18) & PET scan (01/10/18), see above   ??  PLAN: DCEP for disease and pain control. Hope for allogeneic transplant in CR2, but needs to disease response and improvement in lung function   ??  2. ??ID: Afebrile, no signs of infection   - Cont Valtrex prophylaxis   - Start Diflucan 200 mg daily for thrush (01/13/18)  ??  3. Heme: Anemia d/t chemotherapy  - Transfuse for Hgb < 7 and Platelets < 10K  - No transfusion today  ??  4. Metabolic: Stable renal fxn and e-lytes except for hypoNa & steroid-induced hyperglycemia   - Start IVF:  NS @ 100 mL/hr  - Replace Mg and K+ per protocol     5. Pulmonary: No acute issues, but currently on steroid taper for COPD per Dr. BlDorann Lodge- Pulm Nodule: Stable on CT chest  From 04/23/17 & 10/18/17 & 11/26/17; cont to monitor.   - CTPA (11/26/17): No PE; resolution of mild upper lobe tree-in-bud opacities; Mild emphysema. Two 3 mm LLL pulmonary nodules, stable  - PFT (12/27/17):  compared to 07/2016: FVC 3.25 L, 66%, and FEV1  1.42 L, 38%. FEV1/FVC is 44%. This is unchanged from prior study. Air trapping seen on lung volumes. Diffusion capacity 3.91, 79% of predicted, down from 5.21   - Follows up with Dr. Dorann Lodge for COPD  - Cont Dex 4 mg Q8hrs (started by [employer])  - Cont Albuterol inhaler prn & home nebulizer (Rx 10/12/17)  - Consult [facility] for COPD management   ??  6. GI/Nutrition: Appetite and oral intake is good   - Cont PPI ppx w/ steroids   - Cont low microbial diet ??  -  Dietary to follow   ??  7. Cardiac: No acute issues   - H/o HLD & has septal wall defect on echocardiogram.  - Echo (07/13/16): abnormal (paradoxical) septal motion is present with preserved LVEF 55-60%.  - Cont Lipitor   ??  8. Anxiety/Depression: Ongoing  - Cont Zoloft 50 mg daily   - Cont Ativan PRN  - Psych to follow as needed   ??  9. Insomnia: ongoing  - He has failed multiple sleep aids in the past. ??  - No complaints currently   ??  10. Peripheral Neuropathy: Ongoing  - Cont Gabapentin 300 mg TID  ??  11. 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  12. Acute on Chronic left lower back pain:  d/t Neoplasm and relapsed/progressive disease   - S/p Radiation (09/01/17 - 09/15/17, [facility])  - Increase Fentanyl patch to 50 mcg/hr (increased 01/13/18)  - Increase Oxycodone 10 mg q2hrs prn (increased 01/13/18)  - Stop IV Morphine 4 mg prn      - DVT Prophylaxis: Platelets >50,000 cells/dL, - daily lovenox prophylaxis ordered  Contraindications to pharmacologic prophylaxis: None  Contraindications to mechanical prophylaxis: None      - Disposition: Once pain controlled and chemo completes w/out toxicity     The patient was seen and examined by Dr. Hunt Oris. This admission history and physical has been discussed and agreed upon by Dr. Hunt Oris.    Wayland Salinas, APRN - CNP  Harlene Salts, MD  [facility]  Please contact me through [place]

## 2018-01-13 NOTE — Progress Notes (Signed)
VSS. Reassessment completed. PRN morphine given at pt request. Blood drawn from line. Flushed with normal saline. Sample sent to lab. Pt resting comfortably in bed. Call light within reach. Denies further needs at this time. Will continue to monitor. Electronically signed by Regis Bill, RN on 01/13/2018 at 2:40 AM

## 2018-01-13 NOTE — Behavioral Health Treatment Team (Signed)
Psychology  Pt presented to ED yesterday after results of PET/CT 5/13 showed explosive relapsed disease.  He has been suffering with severe intolerable pain for several days which he says came on suddenly in the middle of the night.  Dr. Lynnette Caffey apparently told the pt he needs aggressive chem and that if his disease doesn't respond he has about a month to live.      The pt says he is very scared and is also thrown by how quickly his disease has overtaken him.  He says he was prepping for transplant in 4-6 weeks and coordinator called and told him to get to ED for admission.  Sig other [name] came from work in time for rounds with Dr. Lynnette Caffey and is staying to provide emotional support.  Pt asking what he should do, how he should react.  Encouraged allowing self to feel whatever he feels today and then focus on quality of life and being present.  Pt asked for copies of his advanced directives to make sure they were up to date.  Reviewed them with the pt and [name] and they are in accord with his wishes.  [name]'s son graduates today and she will need to leave at 3:00. Will follow closely.   David Watkins, Psy.D., ABPP

## 2018-01-13 NOTE — Care Coordination-Inpatient (Signed)
01/13/2018  [place]  Clinical Case Management Department    Patient: David Watkins  MRN: [id number] / DOB: [birth year]  ACCT: [id number]       Met with pt at bedside to introduce department.  Pt very tearful when CM walked into the room .  Pt lives with his significant other David Watkins.  He was admitted yesterday to the ER after a phone call from Oncology stating his PET/CT from 5/13 showed relapse of disease. Pt states he was told he needs aggressive chemo and that if the disease does not respond he has about a month to live.  Pt met with Psychology  today to begin process feelings. Pt was in pain most days but was still working 5 days a week prior to admission.    Emergency Contacts  Healthcare Agent Appointed: Adult siblings  Healthcare Agent's Name: David Watkins    Admission Documentation  Attending Provider: Harlene Salts, MD  Admit date/time: 01/12/2018  3:32 PM  Status: Inpatient [101]  Diagnosis: Pain     Readmission within last 30 days:  no     Living Situation  Discharge Planning  Living Arrangements: Spouse/Significant Other  Support Systems: Spouse/Significant Other, Family Members  Potential Assistance Needed: N/A  Type of Home Care Services: None  Patient expects to be discharged to:: home  Expected Discharge Date: 01/15/18    Service Assessment       Values / Beliefs  Do you have any ethnic, cultural, sacramental, or spiritual religious needs you would like [place] to be aware of while you are in the hospital?: Yes  Spiritual Requests During Hospitalization: [place] (For Healthcare)  Pre-existing DNR Comfort Care/DNR Arrest/DNI Order: No  Healthcare Directive: Yes, patient has an advance directive for healthcare treatment  Type of Healthcare Directive: Durable power of attorney for health care  Copy in Chart: No, copy requested from family  Healthcare Agent Appointed: Adult siblings  Healthcare Agent's Name: David Watkins  If you are unable to speak for yourself, does your  Healthcare Agent or Legal Spokesperson know your healthcare wishes?: Yes                        Destination  home    [place]   none    Home Health/Skilled [place] at Discharge: None  Home care at home? No       Therapy Consults  PT evaluation needed?: No  OT Evalulation Needed?: No  SLP evaluation needed?: No    Home Medical Care  Pt and S.O. To provide   Pharmacy: TBD   Potential Assistance Purchasing Medications:  No  Does patient want to participate in local refill/meds to beds program?: No    Goals of Care  Patient expects to be discharged to:: home  Patient plans for SNF: no         Mode of transport from hospital: Private vehicle     Factors facilitating achievement of predicted outcomes: Caregiver support, Friend support, Cooperative and Pleasant    Barriers to discharge: Anxiety, Lower extremity weakness and Medical complications     Payton Emerald, RN  [facility]  Case Management Department  Ph: [phone]

## 2018-01-13 NOTE — Other (Signed)
01/13/18 1702   Encounter Summary   Services provided to: Patient   Volunteer Visit   (Fr. Amada Jupiter )   Sacraments   Communion Patient received communion

## 2018-01-13 NOTE — Other (Signed)
01/13/18 0737   Encounter Summary   Services provided to: Patient   Referral/Consult From: Patient   Continue Visiting   (es 5/16)   Complexity of Encounter Low   Length of Encounter 15 minutes   Routine   Intervention Placed on communion list

## 2018-01-13 NOTE — Progress Notes (Signed)
VSS. Reassessment completed. PRN percocet given for pain. Pt resting comfortably in bed. Call light within reach. Denies further needs at this time. Will continue to monitor. Electronically signed by Milinda Pointer, RN on 01/13/2018 at 12:08 AM

## 2018-01-13 NOTE — Plan of Care (Signed)
Problem: Falls - Risk of:  Goal: Will remain free from falls  Description  Will remain free from falls  Outcome: Ongoing  Note:   Orthostatic vital signs obtained at start of shift - see flowsheet for details.  Pt does not meet criteria for orthostasis.  Pt is a Med fall risk. See Lattie Corns Fall Score and ABCDS Injury Risk assessments. Pt bed is in low position, side rails up, call light and belongings are in reach.  Fall risk light is on outside pts room.  Pt encouraged to call for assistance as needed. Will continue with hourly rounds for PO intake, pain needs, toileting and repositioning as needed. Orthostatic vital signs obtained at start of shift    Problem: Pain:  Goal: Pain level will decrease  Description  Pain level will decrease  Outcome: Ongoing  Note:   Pain medication increased during shift. Patient verbalized pain regimen is controlling pain.      Problem: Bleeding:  Goal: Will show no signs and symptoms of excessive bleeding  Description  Will show no signs and symptoms of excessive bleeding  Outcome: Ongoing  Note:   Patient's hemoglobin this AM:   Recent Labs     01/13/18  0235   HGB 10.0*     Patient's platelet count this AM:   Recent Labs     01/13/18  0235   PLT 122*    Thrombocytopenia not present at this time.  Patient showing no signs or symptoms of active bleeding.  Transfusion not indicated at this time.  Patient verbalizes understanding of all instructions. Will continue to assess and implement POC. Call light within reach and hourly rounding in place.      Problem: Discharge Planning:  Goal: Discharged to appropriate level of care  Description  Discharged to appropriate level of care  Outcome: Ongoing  Note:   Pt verbalized understanding of treatment plan.

## 2018-01-13 NOTE — Plan of Care (Signed)
Problem: Falls - Risk of:  Goal: Will remain free from falls  Description  Will remain free from falls  Outcome: Ongoing  Note:   Orthostatic vital signs obtained at start of shift - see flowsheet for details.  Pt does not meet criteria for orthostasis.  Pt is a MED fall risk. See Lattie Corns Fall Score and ABCDS Injury Risk assessments. Pt bed is in low position, side rails up, call light and belongings are in reach.  Fall risk light is on outside pts room.  Pt encouraged to call for assistance as needed. Will continue with hourly rounds for PO intake, pain needs, toileting and repositioning as needed.          Problem: Pain:  Goal: Pain level will decrease  Description  Pain level will decrease  Outcome: Ongoing  Note:   Pt educated on importance of calling for pain meds when in pain. Pt verbalized understanding. Pain assessment completed at least once per shift. Will continue to monitor.        Problem: Bleeding:  Goal: Will show no signs and symptoms of excessive bleeding  Description  Will show no signs and symptoms of excessive bleeding  Outcome: Ongoing  Note:   Patient's hemoglobin this AM:   Recent Labs     01/13/18  0235   HGB 10.0*     Patient's platelet count this AM:   Recent Labs     01/13/18  0235   PLT 122*    Thrombocytopenia Precautions in place.  Patient showing no signs or symptoms of active bleeding.  Transfusion not indicated at this time.  Patient verbalizes understanding of all instructions. Will continue to assess and implement POC. Call light within reach and hourly rounding in place.

## 2018-01-13 NOTE — Other (Signed)
01/13/18 1105   Encounter Summary   Services provided to: Patient   Referral/Consult From: Rounding   Continue Visiting   (es 5/16)   Complexity of Encounter Moderate   Length of Encounter 15 minutes   Routine   Type Follow up   Assessment Approachable   Intervention Discussed illness/injury and it's impact;Discussed belief system/religious practices/faith   Outcome Receptive;Engaged in conversation

## 2018-01-13 NOTE — Progress Notes (Signed)
VSS. Ortho negative. Assessment completed. Scheduled meds given whole with water along with PRN oxy at pt request. Pt resting comfortably in bed. Call light within reach. Denies further needs at this time. Will continue to monitor. Electronically signed by Milinda Pointer, RN on 01/13/2018 at 8:03 PM

## 2018-01-13 NOTE — Progress Notes (Signed)
Original chemotherapy orders reviewed and acknowledged. Appropriateness of chemotherapy treatment regimen DCEP for diagnosis of MM was verified.  Patient educated on chemotherapy regimen.  Acknowledgement of informed consent for chemotherapy obtained.      Estimated body surface area is 2.06 meters squared as calculated from the following:    Height as of this encounter: 5\' 9"  (1.753 m).    Weight as of this encounter: 191 lb 6.4 oz (86.8 kg). verified.  Appropriate dosing calculations of chemotherapy based on above height, weight, and BSA verified.      Chemotherapy drug DCEP independently verified with Karsten Fells, RN prior to administration.  Acknowledgement of informed consent for chemotherapy administration verified.  Original order, appropriateness of regimen, drug supplied, height, weight, BSA, dose calculations, expiration dates/times, drug appearance, and two patient identifiers were verified by both RNs.  Drug checked for vesicant/irritant status and for risk of hypersensitivity.  Most recent laboratory values and allergies, were reviewed.  Positive, brisk blood return via CVC was confirmed prior to administration. Chest x-ray for correct line placement reviewed. Earl Lagos and Karsten Fells, RN verified correct rate of chemotherapy and maintenance IV fluids.  Patient was educated on chemotherapy regimen prior to administration including indication for treatment related to disease & side effects of chemotherapy drug.  Patient verbalizes understanding of all instructions.    Monitoring during infusion done per policy, see Flowsheets.  Blood return verified before, during, and after infusion per policy; no signs of extravasation.  Pt tolerating chemotherapy well and without incident.  Chemotherapy infusion end time on the [facility].  Will continue to monitor.

## 2018-01-14 LAB — POCT GLUCOSE
POC Glucose: 175 mg/dl — ABNORMAL HIGH (ref 70–99)
POC Glucose: 212 mg/dl — ABNORMAL HIGH (ref 70–99)
POC Glucose: 247 mg/dl — ABNORMAL HIGH (ref 70–99)

## 2018-01-14 LAB — CBC WITH AUTO DIFFERENTIAL
Basophils %: 0.2 %
Basophils Absolute: 0 10*3/uL (ref 0.0–0.2)
Eosinophils %: 0 %
Eosinophils Absolute: 0 10*3/uL (ref 0.0–0.6)
Hematocrit: 30.7 % — ABNORMAL LOW (ref 40.5–52.5)
Hemoglobin: 10.2 g/dL — ABNORMAL LOW (ref 13.5–17.5)
Lymphocytes %: 3.8 %
Lymphocytes Absolute: 0.3 10*3/uL — ABNORMAL LOW (ref 1.0–5.1)
MCH: 31.4 pg (ref 26.0–34.0)
MCHC: 33.1 g/dL (ref 31.0–36.0)
MCV: 95 fL (ref 80.0–100.0)
MPV: 7.7 fL (ref 5.0–10.5)
Monocytes %: 2.8 %
Monocytes Absolute: 0.2 10*3/uL (ref 0.0–1.3)
Neutrophils %: 93.2 %
Neutrophils Absolute: 7.9 10*3/uL — ABNORMAL HIGH (ref 1.7–7.7)
Platelets: 121 10*3/uL — ABNORMAL LOW (ref 135–450)
RBC: 3.23 M/uL — ABNORMAL LOW (ref 4.20–5.90)
RDW: 18.3 % — ABNORMAL HIGH (ref 12.4–15.4)
WBC: 8.5 10*3/uL (ref 4.0–11.0)

## 2018-01-14 LAB — BASIC METABOLIC PANEL
Anion Gap: 14 (ref 3–16)
BUN: 32 mg/dL — ABNORMAL HIGH (ref 7–20)
CO2: 22 mmol/L (ref 21–32)
Calcium: 8.8 mg/dL (ref 8.3–10.6)
Chloride: 98 mmol/L — ABNORMAL LOW (ref 99–110)
Creatinine: 0.8 mg/dL — ABNORMAL LOW (ref 0.9–1.3)
GFR African American: >60 (ref >60)
GFR Non-African American: >60 (ref >60)
Glucose: 220 mg/dL — ABNORMAL HIGH (ref 70–99)
Potassium: 4.6 mmol/L (ref 3.5–5.1)
Sodium: 134 mmol/L — ABNORMAL LOW (ref 136–145)

## 2018-01-14 MED ORDER — INSULIN LISPRO 100 UNIT/ML SC SOPN
100 UNIT/ML | Freq: Three times a day (TID) | SUBCUTANEOUS | Status: DC
Start: 2018-01-14 — End: 2018-01-18
  Administered 2018-01-14 (×2): 4 [IU] via SUBCUTANEOUS
  Administered 2018-01-15 – 2018-01-18 (×8): 2 [IU] via SUBCUTANEOUS

## 2018-01-14 MED ORDER — INSULIN LISPRO 100 UNIT/ML SC SOPN
100 UNIT/ML | Freq: Every evening | SUBCUTANEOUS | Status: DC
Start: 2018-01-14 — End: 2018-01-18
  Administered 2018-01-15 – 2018-01-17 (×3): 2 [IU] via SUBCUTANEOUS
  Administered 2018-01-18: 02:00:00 1 [IU] via SUBCUTANEOUS

## 2018-01-14 MED ORDER — DEXAMETHASONE 4 MG PO TABS
4 MG | Freq: Two times a day (BID) | ORAL | Status: DC
Start: 2018-01-14 — End: 2018-01-18
  Administered 2018-01-17 – 2018-01-18 (×3): 8 mg via ORAL

## 2018-01-14 MED ORDER — GLUCAGON HCL RDNA (DIAGNOSTIC) 1 MG IJ SOLR
1 MG | INTRAMUSCULAR | Status: DC | PRN
Start: 2018-01-14 — End: 2018-01-18

## 2018-01-14 MED ORDER — DEXTROSE 5 % IV SOLN
5 % | INTRAVENOUS | Status: DC | PRN
Start: 2018-01-14 — End: 2018-01-18

## 2018-01-14 MED ORDER — DEXTROSE 50 % IV SOLN
50 % | INTRAVENOUS | Status: DC | PRN
Start: 2018-01-14 — End: 2018-01-18

## 2018-01-14 MED ORDER — GLUCOSE 40 % PO GEL
40 % | ORAL | Status: DC | PRN
Start: 2018-01-14 — End: 2018-01-18

## 2018-01-14 MED ORDER — IPRATROPIUM-ALBUTEROL 0.5-2.5 (3) MG/3ML IN SOLN
RESPIRATORY_TRACT | Status: DC
Start: 2018-01-14 — End: 2018-01-14
  Administered 2018-01-14 (×2): 1 via RESPIRATORY_TRACT

## 2018-01-14 MED ORDER — DEXAMETHASONE 4 MG PO TABS
4 MG | Freq: Two times a day (BID) | ORAL | Status: DC
Start: 2018-01-14 — End: 2018-01-18

## 2018-01-14 MED FILL — CYCLOPHOSPHAMIDE 1 G IJ SOLR: 1 g | INTRAMUSCULAR | Qty: 40

## 2018-01-14 MED FILL — OXYCODONE HCL 5 MG PO TABS: 5 mg | ORAL | Qty: 2

## 2018-01-14 MED FILL — BUDESONIDE 0.5 MG/2ML IN SUSP: 0.5 MG/2ML | RESPIRATORY_TRACT | Qty: 2

## 2018-01-14 MED FILL — SODIUM CHLORIDE 0.9 % IV SOLN: 0.9 % | INTRAVENOUS | Qty: 1000

## 2018-01-14 MED FILL — FUROSEMIDE 10 MG/ML IJ SOLN: 10 mg/mL | INTRAMUSCULAR | Qty: 4

## 2018-01-14 MED FILL — IPRATROPIUM-ALBUTEROL 0.5-2.5 (3) MG/3ML IN SOLN: 0.5-2.5 (3) MG/3ML | RESPIRATORY_TRACT | Qty: 3

## 2018-01-14 MED FILL — SERTRALINE HCL 50 MG PO TABS: 50 mg | ORAL | Qty: 1

## 2018-01-14 MED FILL — PERFOROMIST 20 MCG/2ML IN NEBU: 20 MCG/2ML | RESPIRATORY_TRACT | Qty: 2

## 2018-01-14 MED FILL — LOVENOX 40 MG/0.4ML SC SOLN: 40 MG/0.4ML | SUBCUTANEOUS | Qty: 0.4

## 2018-01-14 MED FILL — OYSTER SHELL CALCIUM 500 MG PO TABS: 500 mg | ORAL | Qty: 1

## 2018-01-14 MED FILL — VALACYCLOVIR HCL 500 MG PO TABS: 500 mg | ORAL | Qty: 1

## 2018-01-14 MED FILL — GABAPENTIN 300 MG PO CAPS: 300 mg | ORAL | Qty: 1

## 2018-01-14 MED FILL — CYCLOPHOSPHAMIDE 1 G IJ SOLR: 1 g | INTRAMUSCULAR | Qty: 20

## 2018-01-14 MED FILL — ONDANSETRON HCL 8 MG PO TABS: 8 mg | ORAL | Qty: 3

## 2018-01-14 MED FILL — HUMALOG KWIKPEN 100 UNIT/ML SC SOPN: 100 [IU]/mL | SUBCUTANEOUS | Qty: 3

## 2018-01-14 MED FILL — FLUCONAZOLE 200 MG PO TABS: 200 mg | ORAL | Qty: 1

## 2018-01-14 MED FILL — DEXAMETHASONE 4 MG PO TABS: 4 mg | ORAL | Qty: 10

## 2018-01-14 MED FILL — LORAZEPAM 0.5 MG PO TABS: 0.5 mg | ORAL | Qty: 1

## 2018-01-14 MED FILL — PANTOPRAZOLE SODIUM 40 MG PO TBEC: 40 mg | ORAL | Qty: 1

## 2018-01-14 NOTE — Unmapped (Signed)
Your patient was seen at a [facility]. Please go to [email or web address] to view information filed to your patient's chart in Epic.  If you need to view your patient's results prior to gaining access to   Epic CareLink, please contact the [facility] where your patient was seen.              CONSULTS      Consults by Carmie Kanner, MD at 01/14/2018  1:29 PM  Version 1 of 1    Author:  Carmie Kanner, MD Service:  Pulmonology Author Type:  Physician    Filed:  01/14/2018  2:16 PM Date of Service:  01/14/2018  1:29 PM Status:  Signed    Editor:  Carmie Kanner, MD (Physician)       Initial Pulmonary & Critical Care Consult Note      Reason for Consult: COPD management  Requesting Physician: Dr. Lynnette Caffey  Subjective:   CHIEF COMPLAINT / HPI:                The patient is a 62 y.o. 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 He has a Hx of COPD and follows with Dr. Karie Kirks who   last saw him 5/13 and diagnosed him with COPD exac and increased his Decadron to 12 mg daily and started him on Trelegy in place of Breo.[EW.2]    He will be starting DCEP therapy. Currently he denies any dyspnea, cough, wheezing or chest tightness[EW.3]      Past Medical History:      Diagnosis Date   ??????? Bone pain    ??????? Hyperlipidemia    ??????? Leukemia, plasma cell, in relapse (HCC)    ??????? Low back pain    ??????? Multiple myeloma (HCC)    ??????? Neuropathy     chemo induced, feet      Past Surgical History:        Procedure Laterality Date   ??????? BONE MARROW BIOPSY     ??????? BONE MARROW TRANSPLANT     ??????? OTHER SURGICAL HISTORY Left 08/17/2016    trifusion cath placement   ??????? PRE-MALIGNANT / BENIGN SKIN LESION EXCISION Left 03/29/2017    EXCISE LESION LEFT EXTERNAL EAR WITH FROZEN SECTION, FULL THICKNESS SKIN GRAFT     Current Medications:    ??????? insulin lispro  0-12 Units Subcutaneous TID  WC   ??????? insulin lispro  0-6 Units Subcutaneous Nightly   ??????? [START ON 01/17/2018] dexamethasone  8 mg Oral 2 times per day   ??????? [START ON 01/19/2018] dexamethasone  4 mg Oral 2 times per day   ??????? ipratropium-albuterol  1 ampule Inhalation Q4H WA   ??????? enoxaparin  40 mg Subcutaneous QPM   ??????? fentaNYL  1 patch Transdermal Q72H   ??????? ondansetron  24 mg Oral Q24H   ??????? dexamethasone  40 mg Oral Q24H   ??????? cyclophosphamide/etoposide/CISplatin chemo infusion   Intravenous Q24H   ??????? furosemide  40 mg Intravenous Q12H   ??????? fluconazole  200 mg Oral Daily   ??????? gabapentin  300 mg Oral TID   ??????? pantoprazole  40 mg Oral QAM AC   ??????? sertraline  50 mg Oral Daily   ??????? valACYclovir  500 mg Oral BID   ??????? sodium chloride flush  10 mL Intravenous 2 times per day   ??????? Saline Mouthwash  15 mL Swish &  Spit 4x Daily AC & HS   ??????? budesonide  0.5 mg Nebulization BID   ??????? formoterol  20 mcg Nebulization BID   ??????? tiotropium  18 mcg Inhalation Daily   ??????? calcium elemental  500 mg Oral BID        Social History:    Former smoker - quick 2014    Family History:   CAD  Lung Cancer    REVIEW OF SYSTEMS:    CONSTITUTIONAL:  negative for fevers, chills, diaphoresis, activity change, appetite change, fatigue, night sweats and unexpected weight change.   EYES:  negative for blurred vision, eye discharge, visual disturbance and icterus  HEENT:  negative for hearing loss, tinnitus, ear drainage, sinus pressure, nasal congestion, epistaxis and snoring  RESPIRATORY:  See HPI  CARDIOVASCULAR:  negative for chest pain, palpitations, exertional chest pressure/discomfort, edema, syncope  GASTROINTESTINAL:  negative for nausea, vomiting, diarrhea, constipation, blood in stool and abdominal pain  GENITOURINARY:  negative for frequency, dysuria, urinary incontinence, decreased urine volume, and hematuria  HEMATOLOGIC/LYMPHATIC:  negative for easy bruising, bleeding and lymphadenopathy  ALLERGIC/IMMUNOLOGIC:  negative for recurrent infections,  angioedema, anaphylaxis and drug reactions  ENDOCRINE:  negative for weight changes and diabetic symptoms including polyuria, polydipsia and polyphagia  MUSCULOSKELETAL:  negative for  pain in all extremities, joint swelling, decreased range of motion in all extremities and muscle weakness  NEUROLOGICAL:  negative for headaches, slurred speech, unilateral weakness  PSYCHIATRIC/BEHAVIORAL: negative for hallucinations, behavioral problems, confusion and agitation.     Objective:   PHYSICAL EXAM:      VITALS:  BP (!) 142/91    Pulse 83    Temp 97.6 ????F (36.4 ????C) (Oral)    Resp 18    Ht 5' 9 (1.753 m)    Wt 194 lb 9.6 oz (88.3 kg)    SpO2 91%    BMI 28.74 kg/m????   24HR INTAKE/OUTPUT:      Intake/Output Summary (Last 24 hours) at 01/14/2018 1329  Last data filed at 01/14/2018 1141  Gross per 24 hour   Intake 3144 ml   Output 3375 ml   Net -231 ml     CURRENT PULSE OXIMETRY:  SpO2: 91 %  24HR PULSE OXIMETRY RANGE:  SpO2  Avg: 92.9 %  Min: 91 %  Max: 95 %  On room air  CONSTITUTIONAL:  awake, alert, cooperative, no apparent distress, and appears stated age  EYES: Pupils equal round and reactive to light.  Normal conjunctiva  EARS, NOSE, MOUTH & THROAT: Normal oropharynx.  Ears and nose appear normal  NECK:  Supple, symmetrical, trachea midline, no adenopathy, thyroid symmetric, not enlarged and no tenderness, skin normal  LUNGS:  no increased work of breathing.  Faint bilateral expiratory wheezing  CARDIOVASCULAR:  normal S1 and S2, no edema and no JVD  ABDOMEN:  normal bowel sounds, non-distended and no masses palpated, and no tenderness to palpation. No hepatospleenomegaly  LYMPHADENOPATHY:  no axillary or supraclavicular adenopathy. No cervical adnenopathy  PSYCHIATRIC: Oriented to person place and time. No obvious depression or anxiety.  MUSCULOSKELETAL: No obvious misalignment or effusion of the joints. No clubbing, cyanosis of the digits.  SKIN:  normal skin color, texture, turgor and no redness, warmth, or swelling.  No palpable nodules    DATA:    Old records have been reviewed  CBC with Differential:    Lab Results   Component Value Date    WBC 8.5 01/14/2018    RBC 3.23 01/14/2018  RBC 3.30 04/24/2016    HGB 10.2 01/14/2018    HCT 30.7 01/14/2018    PLT 121 01/14/2018    MCV 95.0 01/14/2018    MCH 31.4 01/14/2018    MCHC 33.1 01/14/2018    RDW 18.3 01/14/2018    NRBC 4 04/24/2017    BANDSPCT 1 05/26/2017    BLASTSPCT 2 04/25/2017    METASPCT 2 05/26/2017    LYMPHOPCT 3.8 01/14/2018    LYMPHOPCT 18.7 04/24/2016    PROMYELOPCT 1 04/27/2017    MONOPCT 2.8 01/14/2018    MYELOPCT 1 05/26/2017    BASOPCT 0.2 01/14/2018    MONOSABS 0.2 01/14/2018    LYMPHSABS 0.3 01/14/2018    EOSABS 0.0 01/14/2018    BASOSABS 0.0 01/14/2018     BMP:    Lab Results   Component Value Date    NA 134 01/14/2018    K 4.6 01/14/2018    K 4.8 01/12/2018    CL 98 01/14/2018    CO2 22 01/14/2018    BUN 32 01/14/2018    CREATININE 0.8 01/14/2018    CALCIUM 8.8 01/14/2018    GFRAA >60 01/14/2018    LABGLOM >60 01/14/2018    GLUCOSE 220 01/14/2018    GLUCOSE 110 04/24/2016     Hepatic Function Panel:    Lab Results   Component Value Date    ALKPHOS 68 01/13/2018    ALT 40 01/13/2018    AST 31 01/13/2018    PROT 7.3 01/13/2018    PROT 6.6 04/24/2016    BILITOT 0.3 01/13/2018    BILIDIR <0.2 01/13/2018    IBILI see below 01/13/2018     Radiology Review:  All pertinent images / reports were reviewed as a part of this visit.  CT chest November 26, 2017  reveals the following:  Impression   ????   1. ????No evidence of pulmonary was on the current exam.   ????   2. ???? Mild emphysema with interval resolution of previously noted tree-in-bud opacity in the right middle lobe. ????No localized consolidation seen.   ????   3. ????Small 3 mm pulmonary nodules previously described in the left lower lobe are less conspicuous on the current exam likely in part due to expiratory motion artifact.   ????   4. ????Redemonstration of lytic multiple myeloma lesions throughout the thoracic  spine.      Last PFTs:  07/2016  IMPRESSION:  1. ????Severe obstructive defect.  2. ????There is a significant response to bronchodilators in small and large  airways.  3. ????I am unable to assess for a restrictive defect.  4. ????Normal diffusion capacity.  ????  ECHO:  11/17  Summary  ????Left ventricle size is normal. Normal left ventricular wall thickness. Left  ????ventricular function is normal with ejection fraction estimated at 55-60%.  ????Abnormal (paradoxical) septal motion is present.  ????Mitral annular calcification is present.  ????Individual aortic valve leaflets are not well seen. The valve is sclerotic  ????but opens adequately.  ????There is mild tricuspid regurgitation with RVSP estimated at 33 mmHg.    Assessment       1.  Severe COPD  2.  Multiple myeloma - relapsed  3.  Uncontrolled pain    Plan    COPD appears to be at baseline.  Decadron used as part of his DCEP protocol will help.  Agree with scheduled DuoNeb's.  Continue Pulmicort, Spiriva and formoterol in place of Trilogy.       Will follow. Dr. Toma Deiters  on call this weekend    Derek Benson[EW.1]      Attribution Key   EW.1 - Carmie Kanner, MD on 01/14/2018  1:29 PM EW.2 - Carmie Kanner, MD on 01/14/2018  1:50 PM EW.3 - Carmie Kanner, MD on 01/14/2018  2:15 PM

## 2018-01-14 NOTE — Discharge Instructions (Signed)
[facility] Discharge Instructions    Call for Questions/Concerns:  [phone] [facility] office  The phone number listed above is available 24 hrs/7 days per week  [facility] is open M-F 8am-4:30pm; Sat-Sun/Holidays 8am-1pm    Symptoms to Report Immediately:     Fever of 100.5 or greater   Vomiting without relief after use of anti-nausea medication   Severe abdominal cramping   Diarrhea: More than 3 loose, watery bowel movements in a 24 hour period   Unusual or excessive bleeding from your mouth, nose, rectum, bladder    Sudden onset of shortness of breath or chest pain   Signs/symptoms of infection: redness, warmth, swelling-particularly to central line site    Report to Physician's office within 24 hours:     Pain not relieved by pain medication   Change in urination-odor, cloudiness, frequency, or pain with urination   Flu-like symptoms   Skin changes-rash, hives, redness or peeling of skin    Additional Instructions:     Avoid people with colds, flu-like symptoms, or any sign of infection   Drink plenty of fluids-attempt to consume 2-3 liters (60-100 ounces) of fluids/24 hour period   Continue low microbial diet until instructed by physician to resume normal diet   Bring all of your medications with you to your doctor's appointments   Bring your current medicine list to each hospital and office visit    [place]:       Type:  Right Single Lumen Port                      Date of placement:  06/21/17  Surgeon:  IR  Plan:{[facility] Alen Blew, REMOVE:22043}   01/14/2018 11:14 AM  Jerrye Beavers Lumina Gitto            My Discharge Checklist    Here at the [facility], we want to make sure you have the help you will need once you leave the hospital.  We are going to go over your discharge instructions with you. We give these to you in writing so you will have a reference if you have questions about symptoms or problems to look for after you leave the hospital.      We know you want to feel better and get home soon. Please answer these questions so we can be sure you have what you need, your questions are answered, and you feel prepared for discharge.    Yes No Do you understand your diagnosis?  Yes No Do you know when and who you need to follow up with?  Yes No Do you feel ready to go home & take care of your daily needs?  Yes No Do you have the help you need at home?  Yes No Do you understand what medications your are taking?  Yes No Do you understand what your medications are for?  Yes No Do you understand what medication side effects to watch for?  Yes No Do you know what symptoms or health problems that require an immediate call to your physician?  Yes No Do you feel ready for discharge?  Yes No Are there any questions that you have re: how to care for yourself at home?  Yes No Do you know about MyChart?    If you have any questions after you get home, feel free to call the unit and ask to speak with your nurse.  In about 7-10 days  you will receive a survey. We value your opinion and hope that you have received care that will enable you to choose the best scores when completing the survey.    It was our pleasure to take care of you,  [facility]           [phone]                                                     Outpatient Infusion [phone]    [facility] Physician Office [place] or Procedural Scheduling [phone] ([phone])

## 2018-01-14 NOTE — Behavioral Health Treatment Team (Signed)
Psychologist attended clinical rounds with team and remained after to speak with pt.  He reports his pain is better controlled today.  Provided emotional support and encouragement and will continue to see pt for routine visits through hospital stay.    Jaquel Glassburn, Psy.D., ABPP

## 2018-01-14 NOTE — Progress Notes (Signed)
Administration: Chemotherapy drug DCEP independently verified with Shellia Cleverly, RN prior to administration.  Acknowledgement of informed consent for chemotherapy administration verified.  Original order, appropriateness of regimen, drug supplied, height, weight, BSA, dose calculations, expiration dates/times, drug appearance, and two patient identifiers were verified by both RNs.  Drug checked for vesicant/irritant status and for risk of hypersensitivity.  Most recent laboratory values and allergies, were reviewed.  Positive, brisk blood return via CVC was confirmed prior to administration. Chest x-ray for correct line placement reviewed. Derriana Oser A. Jaretzi Droz and C. Lyman Speller, RN verified correct rate of chemotherapy and maintenance IV fluids.  Patient was educated on chemotherapy regimen prior to administration including indication for treatment related to disease & side effects of chemotherapy drug.  Patient verbalizes understanding of all instructions.    Completion of Chemotherapy: Monitoring during infusion done per policy, see Flowsheets.  Blood return verified before, during, and after infusion per policy; no signs of extravasation.  Pt tolerating chemotherapy well and without incident.  Chemotherapy infusion end time on the [facility].  Will continue to monitor.

## 2018-01-14 NOTE — Consults (Signed)
Initial Pulmonary & Critical Care Consult Note      Reason for Consult: COPD management  Requesting Physician: Dr. Hunt Oris  Subjective:   CHIEF COMPLAINT / HPI:                The patient is a 62 y.o. 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 He has a Hx of COPD and follows with Dr. Dorann Lodge who last saw him 5/13 and diagnosed him with COPD exac and increased his Decadron to 12 mg daily and started him on Trelegy in place of Breo.    He will be starting DCEP therapy. Currently he denies any dyspnea, cough, wheezing or chest tightness      Past Medical History:      Diagnosis Date   ??? Bone pain    ??? Hyperlipidemia    ??? Leukemia, plasma cell, in relapse ([place])    ??? Low back pain    ??? Multiple myeloma ([place])    ??? Neuropathy     chemo induced, feet      Past Surgical History:        Procedure Laterality Date   ??? BONE MARROW BIOPSY     ??? BONE MARROW TRANSPLANT     ??? OTHER SURGICAL HISTORY Left 08/17/2016    trifusion cath placement   ??? PRE-MALIGNANT / BENIGN SKIN LESION EXCISION Left 03/29/2017    EXCISE LESION LEFT EXTERNAL EAR WITH FROZEN SECTION, FULL THICKNESS SKIN GRAFT     Current Medications:    ??? insulin lispro  0-12 Units Subcutaneous TID WC   ??? insulin lispro  0-6 Units Subcutaneous Nightly   ??? [START ON 01/17/2018] dexamethasone  8 mg Oral 2 times per day   ??? [START ON 01/19/2018] dexamethasone  4 mg Oral 2 times per day   ??? ipratropium-albuterol  1 ampule Inhalation Q4H WA   ??? enoxaparin  40 mg Subcutaneous QPM   ??? fentaNYL  1 patch Transdermal Q72H   ??? ondansetron  24 mg Oral Q24H   ??? dexamethasone  40 mg Oral Q24H   ??? cyclophosphamide/etoposide/CISplatin chemo infusion   Intravenous Q24H   ??? furosemide  40 mg Intravenous Q12H   ??? fluconazole  200 mg Oral Daily   ??? gabapentin  300 mg Oral TID   ??? pantoprazole  40 mg Oral QAM AC   ??? sertraline  50 mg Oral Daily   ??? valACYclovir  500 mg Oral BID   ???  sodium chloride flush  10 mL Intravenous 2 times per day   ??? Saline Mouthwash  15 mL Swish & Spit 4x Daily AC & HS   ??? budesonide  0.5 mg Nebulization BID   ??? formoterol  20 mcg Nebulization BID   ??? tiotropium  18 mcg Inhalation Daily   ??? calcium elemental  500 mg Oral BID        Social History:    Former smoker - quick 2014    Family History:   CAD  Lung Cancer    REVIEW OF SYSTEMS:    CONSTITUTIONAL:  negative for fevers, chills, diaphoresis, activity change, appetite change, fatigue, night sweats and unexpected weight change.   EYES:  negative for blurred vision, eye discharge, visual disturbance and icterus  HEENT:  negative for hearing loss, tinnitus, ear drainage, sinus pressure, nasal congestion, epistaxis and snoring  RESPIRATORY:  See HPI  CARDIOVASCULAR:  negative for chest pain, palpitations, exertional chest pressure/discomfort, edema, syncope  GASTROINTESTINAL:  negative for nausea, vomiting, diarrhea, constipation, blood in stool and abdominal pain  GENITOURINARY:  negative for frequency, dysuria, urinary incontinence, decreased urine volume, and hematuria  HEMATOLOGIC/LYMPHATIC:  negative for easy bruising, bleeding and lymphadenopathy  ALLERGIC/IMMUNOLOGIC:  negative for recurrent infections, angioedema, anaphylaxis and drug reactions  ENDOCRINE:  negative for weight changes and diabetic symptoms including polyuria, polydipsia and polyphagia  MUSCULOSKELETAL:  negative for  pain in all extremities, joint swelling, decreased range of motion in all extremities and muscle weakness  NEUROLOGICAL:  negative for headaches, slurred speech, unilateral weakness  PSYCHIATRIC/BEHAVIORAL: negative for hallucinations, behavioral problems, confusion and agitation.     Objective:   PHYSICAL EXAM:      VITALS:  BP (!) 142/91    Pulse 83    Temp 97.6 ??F (36.4 ??C) (Oral)    Resp 18    Ht 5' 9"  (1.753 m)    Wt 194 lb 9.6 oz (88.3 kg)    SpO2 91%    BMI 28.74 kg/m??   24HR INTAKE/OUTPUT:      Intake/Output Summary (Last  24 hours) at 01/14/2018 1329  Last data filed at 01/14/2018 1141  Gross per 24 hour   Intake 3144 ml   Output 3375 ml   Net -231 ml     CURRENT PULSE OXIMETRY:  SpO2: 91 %  24HR PULSE OXIMETRY RANGE:  SpO2  Avg: 92.9 %  Min: 91 %  Max: 95 %  On room air  CONSTITUTIONAL:  awake, alert, cooperative, no apparent distress, and appears stated age  EYES: Pupils equal round and reactive to light.  Normal conjunctiva  EARS, NOSE, MOUTH & THROAT: Normal oropharynx.  Ears and nose appear normal  NECK:  Supple, symmetrical, trachea midline, no adenopathy, thyroid symmetric, not enlarged and no tenderness, skin normal  LUNGS:  no increased work of breathing.  Faint bilateral expiratory wheezing  CARDIOVASCULAR:  normal S1 and S2, no edema and no JVD  ABDOMEN:  normal bowel sounds, non-distended and no masses palpated, and no tenderness to palpation. No hepatospleenomegaly  LYMPHADENOPATHY:  no axillary or supraclavicular adenopathy. No cervical adnenopathy  PSYCHIATRIC: Oriented to person place and time. No obvious depression or anxiety.  MUSCULOSKELETAL: No obvious misalignment or effusion of the joints. No clubbing, cyanosis of the digits.  SKIN:  normal skin color, texture, turgor and no redness, warmth, or swelling. No palpable nodules    DATA:    Old records have been reviewed  CBC with Differential:    Lab Results   Component Value Date    WBC 8.5 01/14/2018    RBC 3.23 01/14/2018    RBC 3.30 04/24/2016    HGB 10.2 01/14/2018    HCT 30.7 01/14/2018    PLT 121 01/14/2018    MCV 95.0 01/14/2018    MCH 31.4 01/14/2018    MCHC 33.1 01/14/2018    RDW 18.3 01/14/2018    NRBC 4 04/24/2017    BANDSPCT 1 05/26/2017    BLASTSPCT 2 04/25/2017    METASPCT 2 05/26/2017    LYMPHOPCT 3.8 01/14/2018    LYMPHOPCT 18.7 04/24/2016    PROMYELOPCT 1 04/27/2017    MONOPCT 2.8 01/14/2018    MYELOPCT 1 05/26/2017    BASOPCT 0.2 01/14/2018    MONOSABS 0.2 01/14/2018    LYMPHSABS 0.3 01/14/2018    EOSABS 0.0 01/14/2018    BASOSABS 0.0 01/14/2018      BMP:  Lab Results   Component Value Date    NA 134 01/14/2018    K 4.6 01/14/2018    K 4.8 01/12/2018    CL 98 01/14/2018    CO2 22 01/14/2018    BUN 32 01/14/2018    CREATININE 0.8 01/14/2018    CALCIUM 8.8 01/14/2018    GFRAA >60 01/14/2018    LABGLOM >60 01/14/2018    GLUCOSE 220 01/14/2018    GLUCOSE 110 04/24/2016     Hepatic Function Panel:    Lab Results   Component Value Date    ALKPHOS 68 01/13/2018    ALT 40 01/13/2018    AST 31 01/13/2018    PROT 7.3 01/13/2018    PROT 6.6 04/24/2016    BILITOT 0.3 01/13/2018    BILIDIR <0.2 01/13/2018    IBILI see below 01/13/2018     Radiology Review:  All pertinent images / reports were reviewed as a part of this visit.  CT chest November 26, 2017  reveals the following:  Impression   ??   1. ??No evidence of pulmonary was on the current exam.   ??   2. ?? Mild emphysema with interval resolution of previously noted tree-in-bud opacity in the right middle lobe. ??No localized consolidation seen.   ??   3. ??Small 3 mm pulmonary nodules previously described in the left lower lobe are less conspicuous on the current exam likely in part due to expiratory motion artifact.   ??   4. ??Redemonstration of lytic multiple myeloma lesions throughout the thoracic spine.      Last PFTs:  07/2016  IMPRESSION:  1. ??Severe obstructive defect.  2. ??There is a significant response to bronchodilators in small and large  airways.  3. ??I am unable to assess for a restrictive defect.  4. ??Normal diffusion capacity.  ??  ECHO:  11/17  Summary  ??Left ventricle size is normal. Normal left ventricular wall thickness. Left  ??ventricular function is normal with ejection fraction estimated at 55-60%.  ??Abnormal (paradoxical) septal motion is present.  ??Mitral annular calcification is present.  ??Individual aortic valve leaflets are not well seen. The valve is sclerotic  ??but opens adequately.  ??There is mild tricuspid regurgitation with RVSP estimated at 33 mmHg.    Assessment       1.  Severe COPD  2.   Multiple myeloma - relapsed  3.  Uncontrolled pain    Plan    COPD appears to be at baseline.  Decadron used as part of his DCEP protocol will help.  Agree with scheduled DuoNeb's.  Continue Pulmicort, Spiriva and formoterol in place of Trilogy.       Will follow. Dr. Denver Faster on call this weekend    Sherril Cong

## 2018-01-14 NOTE — Plan of Care (Signed)
Problem: Falls - Risk of:  Goal: Will remain free from falls  Description  Will remain free from falls  01/14/2018 0550 by Milinda Pointer, RN  Outcome: Ongoing  Note:   Orthostatic vital signs obtained at start of shift - see flowsheet for details.  Pt does not meet criteria for orthostasis.  Pt is a Med fall risk. See Lattie Corns Fall Score and ABCDS Injury Risk assessments. Pt bed is in low position, side rails up, call light and belongings are in reach.  Fall risk light is on outside pts room.  Pt encouraged to call for assistance as needed. Will continue with hourly rounds for PO intake, pain needs, toileting and repositioning as needed.          Problem: Pain:  Goal: Pain level will decrease  Description  Pain level will decrease  01/14/2018 0550 by Milinda Pointer, RN  Outcome: Ongoing  Note:   Pt educated on importance of calling for pain meds when in pain. Pt verbalized understanding. Pain assessment completed at least once per shift. Will continue to monitor.        Problem: Bleeding:  Goal: Will show no signs and symptoms of excessive bleeding  Description  Will show no signs and symptoms of excessive bleeding  01/14/2018 0550 by Milinda Pointer, RN  Outcome: Ongoing  Note:   Patient's hemoglobin this AM:   Recent Labs     01/14/18  0303   HGB 10.2*     Patient's platelet count this AM:   Recent Labs     01/14/18  0303   PLT 121*    Thrombocytopenia Precautions in place.  Patient showing no signs or symptoms of active bleeding.  Transfusion not indicated at this time.  Patient verbalizes understanding of all instructions. Will continue to assess and implement POC. Call light within reach and hourly rounding in place.          Problem: Discharge Planning:  Goal: Discharged to appropriate level of care  Description  Discharged to appropriate level of care  01/14/2018 0550 by Milinda Pointer, RN  Outcome: Ongoing

## 2018-01-14 NOTE — Care Coordination-Inpatient (Signed)
Type of Admission  Relapse MM & increase pain  C1 Day #2 DCEP ( 01/13/18)        Central venous catheter  Right Single Lumen Port (IR, 06/21/17)      Plan    Pain control & treatment of relapse disease with DCEP      Update  01/14/18:  Admitted for pain control & start of DCEP.  States pain control is somewhat better today.  Steroids causing him insomnia and elevated blood sugars.  Psychologist following for anxiety.          Education  01/14/18:  Discussed use of Neulastqa OBI at dischrge, prefers to receive injection over OBI, discussed with Dr. Hunt Oris & [facility] notified  20    Discharge    At Completion of Chemotherapy---> Monday, 5/20      Pending

## 2018-01-14 NOTE — Progress Notes (Addendum)
Opp Progress Note    01/14/2018     David Watkins    MRN: [id number]    DOB: [birth year]    SUBJECTIVE:  Pain improved, overall feeling a little better.    ECOG PS:  (2) Ambulatory and capable of self care, unable to carry out work activity, up and about > 50% or waking hours    Isolation: None    Medications    Scheduled Meds:  ??? enoxaparin  40 mg Subcutaneous QPM   ??? fentaNYL  1 patch Transdermal Q72H   ??? ondansetron  24 mg Oral Q24H   ??? dexamethasone  40 mg Oral Q24H   ??? cyclophosphamide/etoposide/CISplatin chemo infusion   Intravenous Q24H   ??? furosemide  40 mg Intravenous Q12H   ??? fluconazole  200 mg Oral Daily   ??? gabapentin  300 mg Oral TID   ??? pantoprazole  40 mg Oral QAM AC   ??? sertraline  50 mg Oral Daily   ??? valACYclovir  500 mg Oral BID   ??? sodium chloride flush  10 mL Intravenous 2 times per day   ??? Saline Mouthwash  15 mL Swish & Spit 4x Daily AC & HS   ??? budesonide  0.5 mg Nebulization BID   ??? formoterol  20 mcg Nebulization BID   ??? tiotropium  18 mcg Inhalation Daily   ??? calcium elemental  500 mg Oral BID     Continuous Infusions:  ??? sodium chloride 1,000 mL (01/13/18 2158)   ??? sodium chloride       PRN Meds:.oxyCODONE, prochlorperazine **OR** prochlorperazine, LORazepam **OR** LORazepam, ondansetron, sodium chloride, sodium chloride flush, potassium chloride, magnesium hydroxide, Saline Mouthwash, alteplase, albuterol, LORazepam    ROS:  As noted above, otherwise remainder of 10-point ROS negative    Physical Exam:     I&O:      Intake/Output Summary (Last 24 hours) at 01/14/2018 0714  Last data filed at 01/14/2018 0534  Gross per 24 hour   Intake 3144 ml   Output 1675 ml   Net 1469 ml       Vital Signs:  BP (!) 143/86    Pulse 81    Temp 97.7 ??F (36.5 ??C) (Oral)    Resp 18    Ht 5' 9"  (1.753 m)    Wt 191 lb 6.4 oz (86.8 kg)    SpO2 92%    BMI 28.26 kg/m??     Weight:    Wt Readings from Last 3 Encounters:   01/13/18 191 lb 6.4 oz (86.8 kg)   01/10/18 194 lb 12.8 oz (88.4 kg)   12/06/17 195 lb  (88.5 kg)         General: Awake, alert and oriented.  HEENT: normocephalic, alopecia, PERRL, no scleral erythema or icterus, Oral mucosa moist and intact, thrush in posterior pharynx   LYMPH:  Left axillary lymphadenopathy   NECK: supple without palpable adenopathy  BACK: Straight negative CVAT  SKIN: warm dry and intact without lesions rashes or masses  CHEST: Diffuse rhonchi w/ expiratory wheeze, without use of accessory muscles  CV: Normal S1 S2, RRR, no MRG  ABD: NT ND normoactive BS, no palpable masses or hepatosplenomegaly  EXTREMITIES: without edema, denies calf tenderness  NEURO: CN II - XII grossly intact  CATHETER: Right IJ chest Port (06/21/17, [facility]) - CDI    Data    CBC:   Recent Labs     01/12/18  1625 01/13/18  0235 01/14/18  0303  WBC 11.5* 10.6 8.5   HGB 10.9* 10.0* 10.2*   HCT 34.3* 30.3* 30.7*   MCV 95.0 94.7 95.0   PLT 125* 122* 121*     BMP/Mag:  Recent Labs     01/12/18  1625 01/13/18  0235 01/14/18  0303   NA 134* 134* 134*   K 4.8 4.7 4.6   CL 98* 97* 98*   CO2 24 24 22    PHOS  --  4.6  --    BUN 32* 33* 32*   CREATININE 0.8* 0.8* 0.8*   MG  --  2.00  --      LIVP:   Recent Labs     01/13/18  0235   AST 31   ALT 40   BILIDIR <0.2   BILITOT 0.3   ALKPHOS 68     Coags:   Recent Labs     01/12/18  1624 01/13/18  0235   PROTIME 12.7 12.4   INR 1.11 1.09   APTT  --  24.1*     Uric Acid   Recent Labs     01/13/18  0235   LABURIC 3.5       Diagnostics:  1.  PET scan (01/10/18):    ??  Myeloma Labs (01/03/18):  SPEP:  reveals that M2, previously characterized as monoclonal IgG kappa in  mid-to-slow gamma is 1.0 gm/dL, greater than the 0.2 gm/dL observed on 08 Nov 2017.   SIFE:  Serum immunofixation electrophoresis reveals persistent M2 in mid-to-slow gamma, a monoclonal IgG kappa.  A recent M-spike, M1, was minor monoclonal IgG lambda in mid-gamma; M1 continues to be absent.  SFLC:  Kappa:  30.80, previously (11/08/17) - 10.40  Lambda:  1.46, previously (11/08/17) - 5.97  Free Kappa Lambda  Ratio:  21.10, previously (11/08/17) - 1.74  Quantitative Immunoglobulins:  IgG:   1480, previously (11/08/17) - 552  IgA:   <26, previously (11/08/17) - 30  IgM:   <20  ??  PROBLEM LIST: ??????????   ????  1.  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  Peripheral neuropathy  3.  Anxiety/Depression  4.  Hyperlipidemia  5.  Hypertension  6.  Insomnia  7.  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  COPD  9.  Influenza A (10/12/17)  ????  TREATMENT: ??????????   ??  1. Rad Tx to T5-7, T10-L3, Right Scapula - 3000 cGy - Dr. Jamas Lav 03/20/16-04/01/16  2. RVD x1 03/20/16 - discontinued d/t rash   3. Velcade/Pomalyst/Dex x3 cycles 04/23/16-06/17/16 - discontinued d/t reaction to pomalyst  4. Velcade/Dex x 1 cycle 06/26/16 (last dose of dexamethasone 07/22/16  5. High-dose melphalan followed by administration of PBSCs 2.36 x10^6 cd34cells/kg on 08/28/16  6. Maintenance Revlimid 21m daily (12/2016-04/23/17)  7. Dexamethasone 428mdaily (04/24/17)  8. DCEP   Cycle #1 - 04/26/17  Cycle #2 - 05/25/17 - excellent response  9.??Dara/Velcade/Dex (C1D1 - 06/22/17) - BMBx ??10/18/17 - CR  Cycles 7-8 (21 day cycle)  - Dex 20 mg po D4,5.8,9,11 and 12  - Dara 16 mg/Kg D1 only with Dex 20 mg IV  - Velcade 1.3 mg/M2 D1,4,8 and 11  Cycle 9 and beyond (maintenance, 11/08/17 - 01/03/18)  Dara 16 mg/Kg Q 28 days  Dex 12 mg IV D1  Velcade Q 2 weeks??  10. DCEP Cycle #1 - 01/13/18  ??  ASSESSMENT AND PLAN:??????????   ??  1. ??IgG kappa multiple myeloma / Plasma Cell Leukemia:??Relapsed disease   - S/p treatment Dara/Velcade/Dex x 8 cycles (06/22/17 - 10/2017). Followed by maintenance Dara Q4 wks/Velcade Q2ks/Dex (started 11/08/17)  - Now w/ progressive disease based on myeloma labs (01/03/18) & PET scan (01/10/18), see above   ??  PLAN: DCEP for disease and pain control. Hope for allogeneic transplant in CR2, but needs to disease response and improvement in lung function   - Cont DCEP    Cycle #1, Day + 2  ??  2. ??ID:  Afebrile  - Cont Valtrex prophylaxis   - Cont Diflucan 200 mg daily for thrush (01/13/18)  ??  3. Heme:??Anemia d/t chemotherapy  - Transfuse for Hgb < 7 and Platelets < 10K  - No transfusion today  ??  4.??Metabolic: Stable renal fxn and e-lytes except for hypoNa & steroid-induced hyperglycemia   - Hyperglycemia:  Start medium regimen SSI, AC&HS  - Cont IVF:  NS @ 100 mL/hr & Chemo @ 42 mL/hr  - Replace Mg and K+ per protocol   ??  5. Pulmonary:??No acute issues, but currently on steroid taper for COPD per Dr. Dorann Lodge   - Pulm Nodule: Stable on CT chest  From 04/23/17 & 10/18/17 & 11/26/17; cont to monitor.   - CTPA??(11/26/17): No PE;??resolution of??mild upper lobe tree-in-bud opacities; Mild emphysema. Two 3 mm LLL pulmonary nodules, stable  - PFT (12/27/17):  compared to 07/2016: FVC 3.25 L, 66%, and FEV1  1.42 L, 38%. FEV1/FVC is 44%. This is unchanged from prior study. Air trapping seen on lung volumes. Diffusion capacity 3.91, 79% of predicted, down from 5.21   - Follows up with Dr. Bloomer??for COPD  - Cont??Dex 40 mg daily x 4 doses w/chemo, then he was on 12 mg daily for COPD (started by Bloomer)  - Cont Albuterol inhaler prn & home nebulizer (Rx 10/12/17)  - Consult Bloomer for COPD management   ??  6. GI/Nutrition: Appetite and oral intake is good   - Cont PPI ppx w/ steroids   - Cont low microbial diet ??  - Dietary to follow   ??  7. 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  - Echo (07/13/16): abnormal (paradoxical) septal motion is present with preserved LVEF 55-60%.  - Cont Lipitor   ??  8. Anxiety/Depression: Ongoing  - Cont Zoloft 50 mg daily   - Cont Ativan PRN  - Psych to follow as needed   ??  9. Insomnia:??ongoing  - He has failed multiple sleep aids in the past. ??  - No complaints currently   ??  10. Peripheral Neuropathy:??Ongoing  - Cont Gabapentin 300 mg TID  ??  11. Bone Health: No acute fx   - H/o spinal cord compression & diffuse lytic lesions t/o skeleton  - CT Chest  (04/23/17) - multiple lytic lesions throughout the skeleton   - Cont Ca/Vit D &??Zometa monthly (given 01/03/18, next due 01/31/18)  ??  12.??Acute on Chronic left lower back pain:  d/t Neoplasm and relapsed/progressive disease   - S/p Radiation (09/01/17 - 09/15/17, [facility])  - Cont Fentanyl patch 50 mcg/hr (increased 01/13/18)  - Cont Oxycodone 10 mg q2hrs prn (increased 01/13/18)      - DVT Prophylaxis: Platelets >50,000 cells/dL, - daily lovenox prophylaxis ordered  Contraindications to pharmacologic prophylaxis: None  Contraindications to mechanical prophylaxis: None    - Disposition: Once pain controlled and chemo completes w/out toxicit.  Will need Neulasta  01/19/2018 (not OBI)        Wayland Salinas, APRN - CNP   Harlene Salts, MD  [facility]  Please contact me through [place]

## 2018-01-14 NOTE — Progress Notes (Signed)
RESPIRATORY THERAPY ASSESSMENT    Name:  David Watkins Record Number:  [id number]  Age: 62 y.o.   Gender: male  DOB: 17-Feb-1956  Today's Date:  01/14/2018  Room:  3515/3515-01    Assessment     Is the patient being admitted for a COPD or Asthma exacerbation?  No   (If yes the patient will be seen every 4 hours for the first 24 hours and then reassessed)    Patient Admission Diagnosis      Allergies  Allergies   Allergen Reactions   ??? Lenalidomide Rash   ??? Pomalidomide Hives   ??? Ceclor [Cefaclor] Hives       Minimum Predicted Vital Capacity:     0981          Actual Vital Capacity:      1914              Pulmonary History:COPD  Home Oxygen Therapy:  room air  Home Respiratory Therapy:Albuterol and Trelegy Ellipta   Current Respiratory Therapy:  Q4 WA Duoneb, BID Perforomist, BID Pulmicort, Q Day Spiriva  Treatment Type: HHN  Medications: Albuterol/Ipratropium    Respiratory Severity Index(RSI)   Patients with orders for inhalation medications, oxygen, or any therapeutic treatment modality will be placed on Respiratory Protocol.  They will be assessed with the first treatment and at least every 72 hours thereafter.  The following severity scale will be used to determine frequency of treatment intervention.    Smoking History: Pulmonary Disease or Smoking History, Greater than 15 pack year = 2    Social History  Social History     Tobacco Use   ??? Smoking status: Former Smoker     Last attempt to quit: 11/25/2012     Years since quitting: 5.1   ??? Smokeless tobacco: Never Used   Substance Use Topics   ??? Alcohol use: Yes     Comment: socially   ??? Drug use: No       Recent Surgical History: None = 0  Past Surgical History  Past Surgical History:   Procedure Laterality Date   ??? BONE MARROW BIOPSY     ??? BONE MARROW TRANSPLANT     ??? OTHER SURGICAL HISTORY Left 08/17/2016    trifusion cath placement   ??? PRE-MALIGNANT / BENIGN SKIN LESION EXCISION Left 03/29/2017    EXCISE LESION LEFT EXTERNAL EAR WITH FROZEN SECTION, FULL  THICKNESS SKIN GRAFT       Level of Consciousness: Alert, Oriented, and Cooperative = 0    Level of Activity: Walking unassisted = 0    Respiratory Pattern: Regular Pattern; RR 8-20 = 0    Breath Sounds: Absent bilaterally and/or with wheezes = 3    Sputum   ,  ,    Cough: Strong, spontaneous, non-productive = 0    Vital Signs   BP (!) 137/91    Pulse 79    Temp 97.7 ??F (36.5 ??C) (Oral)    Resp 15    Ht '5\' 9"'$  (1.753 m)    Wt 194 lb 9.6 oz (88.3 kg)    SpO2 93%    BMI 28.74 kg/m??   SPO2 (COPD values may differ): Greater than or equal to 92% on room air = 0    Peak Flow (asthma only): not applicable = 0    RSI: 5-6 = Q4hr PRN (every four hours as needed) for dyspnea        Plan  Goals: medication delivery    Patient/caregiver was educated on the proper method of use for Respiratory Care Devices:  Yes      Level of patient/caregiver understanding able to:   ? Verbalize understanding   ? Demonstrate understanding       ? Teach back        ? Needs reinforcement       ?  No available caregiver               ?  Other:     Response to education:  Good     Is patient being placed on Home Treatment Regimen?  No     Does the patient have everything they need prior to discharge?  NA     Comments: Pt with some SOB, states txs have helped in the past and would like to continue getting them on a regular basis at this time     Plan of Care: Q4 WA Duoneb, bid Perforomist, Bid Pulmicort, Q Day Spiriva    Electronically signed by Lanette Hampshire, RCP on 01/14/2018 at 11:22 AM    Respiratory Protocol Guidelines     1. Assessment and treatment by Respiratory Therapy will be initiated for medication and therapeutic interventions upon initiation of aerosolized medication.  2. Physician will be contacted for respiratory rate (RR) greater than 35 breaths per minute. Therapy will be held for heart rate (HR) greater than 140 beats per minute, pending direction from physician.  3. Bronchodilators will be administered via Metered Dose Inhaler  (MDI) with spacer when the following criteria are met:  a. Alert and cooperative     b. HR < 140 bpm  c. RR < 30 bpm                d. Can demonstrate a 2-3 second inspiratory hold  4. Bronchodilators will be administered via Hand Held Nebulizer [facility]) to patients when ANY of the following criteria are met  a. Incognizant or uncooperative          b. Patients treated with HHN at Home        c. Unable to demonstrate proper use of MDI with spacer     d. RR > 30 bpm   5. Bronchodilators will be delivered via Metered Dose Inhaler (MDI), HHN, Aerogen to intubated patients on mechanical ventilation.  6. Inhalation medication orders will be delivered and/or substituted as outlined below.    Aerosolized Medications Ordering and Administration Guidelines:    1. All Medications will be ordered by a physician, and their frequency and/or modality will be adjusted as defined by the patients Respiratory Severity Index (RSI) score.  2. If the patient does not have documented COPD, consider discontinuing anticholinergics when RSI is less than 9.  3. If the bronchospasm worsens (increased RSI), then the bronchodilator frequency can be increased to a maximum of every 4 hours.  If greater than every 4 hours is required, the physician will be contacted.  4. If the bronchospasm improves, the frequency of the bronchodilator can be decreased, based on the patient's RSI, but not less than home treatment regimen frequency.  5. Bronchodilator(s) will be discontinued if patient has a RSI less than 9 and has received no scheduled or as needed treatment for 72  Hrs.    Patients Ordered on a Mucolytic Agent:    1. Must always be administered with a bronchodilator.    2. Discontinue if patient experiences worsened bronchospasm, or secretions have lessened to  the point that the patient is able to clear them with a cough.    Anti-inflammatory and Combination Medications:    1. If the patient lacks prior history of lung disease, is not using inhaled  anti-inflammatory medication at home, and lacks wheezing by examination or by history for at least 24 hours, contact physician for possible discontinuation.

## 2018-01-14 NOTE — Progress Notes (Signed)
VSS. Reassessment completed. PRN oxy given at this time. Pt resting comfortably in bed. Call light within reach. Denies further needs at this time. Will continue to monitor. Electronically signed by Milinda Pointer, RN on 01/14/2018 at 12:07 AM

## 2018-01-14 NOTE — Progress Notes (Signed)
VSS. Reassessment completed. PRN oxy given at this time. Blood drawn from line. Flushed with normal saline. Sample sent to lab. Pt resting comfortably in bed. Call light within reach. Denies further needs at this time. Will continue to monitor. Electronically signed by Milinda Pointer, RN on 01/14/2018 at 3:02 AM

## 2018-01-15 LAB — CULTURE, VRE: VRE Culture: NEGATIVE

## 2018-01-15 LAB — CBC WITH AUTO DIFFERENTIAL
Basophils %: 0.2 %
Basophils Absolute: 0 10*3/uL (ref 0.0–0.2)
Eosinophils %: 0 %
Eosinophils Absolute: 0 10*3/uL (ref 0.0–0.6)
Hematocrit: 27.7 % — ABNORMAL LOW (ref 40.5–52.5)
Hemoglobin: 9.3 g/dL — ABNORMAL LOW (ref 13.5–17.5)
Lymphocytes %: 2.8 %
Lymphocytes Absolute: 0.2 10*3/uL — ABNORMAL LOW (ref 1.0–5.1)
MCH: 31.7 pg (ref 26.0–34.0)
MCHC: 33.4 g/dL (ref 31.0–36.0)
MCV: 95.1 fL (ref 80.0–100.0)
MPV: 6.9 fL (ref 5.0–10.5)
Monocytes %: 4.6 %
Monocytes Absolute: 0.4 10*3/uL (ref 0.0–1.3)
Neutrophils %: 92.4 %
Neutrophils Absolute: 7.4 10*3/uL (ref 1.7–7.7)
PLATELET SLIDE REVIEW: DECREASED
Platelets: 93 10*3/uL — ABNORMAL LOW (ref 135–450)
RBC: 2.92 M/uL — ABNORMAL LOW (ref 4.20–5.90)
RDW: 18.9 % — ABNORMAL HIGH (ref 12.4–15.4)
WBC: 8 10*3/uL (ref 4.0–11.0)

## 2018-01-15 LAB — BASIC METABOLIC PANEL
Anion Gap: 12 (ref 3–16)
BUN: 30 mg/dL — ABNORMAL HIGH (ref 7–20)
CO2: 24 mmol/L (ref 21–32)
Calcium: 8.4 mg/dL (ref 8.3–10.6)
Chloride: 100 mmol/L (ref 99–110)
Creatinine: 0.7 mg/dL — ABNORMAL LOW (ref 0.9–1.3)
GFR African American: >60 (ref >60)
GFR Non-African American: >60 (ref >60)
Glucose: 177 mg/dL — ABNORMAL HIGH (ref 70–99)
Potassium: 4.2 mmol/L (ref 3.5–5.1)
Sodium: 136 mmol/L (ref 136–145)

## 2018-01-15 LAB — POCT GLUCOSE
POC Glucose: 200 mg/dl — ABNORMAL HIGH (ref 70–99)
POC Glucose: 194 mg/dl — ABNORMAL HIGH (ref 70–99)
POC Glucose: 157 mg/dl — ABNORMAL HIGH (ref 70–99)
POC Glucose: 138 mg/dl — ABNORMAL HIGH (ref 70–99)

## 2018-01-15 MED ORDER — ALBUTEROL SULFATE (2.5 MG/3ML) 0.083% IN NEBU
RESPIRATORY_TRACT | Status: DC
Start: 2018-01-15 — End: 2018-01-18
  Administered 2018-01-15 – 2018-01-18 (×12): 2.5 mg via RESPIRATORY_TRACT

## 2018-01-15 MED ORDER — IPRATROPIUM-ALBUTEROL 0.5-2.5 (3) MG/3ML IN SOLN
RESPIRATORY_TRACT | Status: DC | PRN
Start: 2018-01-15 — End: 2018-01-18

## 2018-01-15 MED FILL — BUDESONIDE 0.5 MG/2ML IN SUSP: 0.5 MG/2ML | RESPIRATORY_TRACT | Qty: 2

## 2018-01-15 MED FILL — GABAPENTIN 300 MG PO CAPS: 300 mg | ORAL | Qty: 1

## 2018-01-15 MED FILL — ALBUTEROL SULFATE (2.5 MG/3ML) 0.083% IN NEBU: RESPIRATORY_TRACT | Qty: 3

## 2018-01-15 MED FILL — OXYCODONE HCL 5 MG PO TABS: 5 mg | ORAL | Qty: 2

## 2018-01-15 MED FILL — VALACYCLOVIR HCL 500 MG PO TABS: 500 mg | ORAL | Qty: 1

## 2018-01-15 MED FILL — SODIUM CHLORIDE 0.9 % IV SOLN: 0.9 % | INTRAVENOUS | Qty: 1000

## 2018-01-15 MED FILL — OYSTER SHELL CALCIUM 500 MG PO TABS: 500 mg | ORAL | Qty: 1

## 2018-01-15 MED FILL — PANTOPRAZOLE SODIUM 40 MG PO TBEC: 40 mg | ORAL | Qty: 1

## 2018-01-15 MED FILL — PERFOROMIST 20 MCG/2ML IN NEBU: 20 MCG/2ML | RESPIRATORY_TRACT | Qty: 2

## 2018-01-15 MED FILL — FUROSEMIDE 10 MG/ML IJ SOLN: 10 mg/mL | INTRAMUSCULAR | Qty: 4

## 2018-01-15 MED FILL — LORAZEPAM 0.5 MG PO TABS: 0.5 mg | ORAL | Qty: 1

## 2018-01-15 MED FILL — DEXAMETHASONE 4 MG PO TABS: 4 mg | ORAL | Qty: 10

## 2018-01-15 MED FILL — LOVENOX 40 MG/0.4ML SC SOLN: 40 MG/0.4ML | SUBCUTANEOUS | Qty: 0.4

## 2018-01-15 MED FILL — SERTRALINE HCL 50 MG PO TABS: 50 mg | ORAL | Qty: 1

## 2018-01-15 MED FILL — FLUCONAZOLE 200 MG PO TABS: 200 mg | ORAL | Qty: 1

## 2018-01-15 MED FILL — ONDANSETRON HCL 8 MG PO TABS: 8 mg | ORAL | Qty: 3

## 2018-01-15 NOTE — Plan of Care (Signed)
Problem: Falls - Risk of:  Goal: Will remain free from falls  Description  Will remain free from falls  Note:   Orthostatic vital signs obtained at start of shift - see flowsheet for details.  Pt does not meet criteria for orthostasis.  Pt is a Med fall risk. See Lattie Corns Fall Score and ABCDS Injury Risk assessments.     - Screening for Orthostasis AND not a High Falls Risk per MORSE/ABCDS: Pt bed is in low position, side rails up, call light and belongings are in reach.  Fall risk light is on outside pts room.  Pt encouraged to call for assistance as needed. Will continue with hourly rounds for PO intake, pain needs, toileting and repositioning as needed.         Problem: Pain:  Goal: Pain level will decrease  Description  Pain level will decrease  Note:   Pt complains of back pain.  Medicated with PO oxycodone.  Pt satisfied.  Will continue to monitor.      Problem: Bleeding:  Goal: Will show no signs and symptoms of excessive bleeding  Description  Will show no signs and symptoms of excessive bleeding  Note:   Patient's hemoglobin this AM:   Recent Labs     01/15/18  0400   HGB 9.3*     Patient's platelet count this AM:   Recent Labs     01/15/18  0400   PLT 93*    Thrombocytopenia Precautions in place.  Patient showing no signs or symptoms of active bleeding.  Transfusion not indicated at this time.  Patient verbalizes understanding of all instructions. Will continue to assess and implement POC. Call light within reach and hourly rounding in place.         Problem: Discharge Planning:  Goal: Discharged to appropriate level of care  Description  Discharged to appropriate level of care  Note:   Pt has been updated on POC and has no questions at this time.  Will continue to monitor.

## 2018-01-15 NOTE — Progress Notes (Signed)
Administration: Chemotherapy drug Cytoxan/etoposide/cisplatin BAG # 3  independently verified with Guerry Bruin, RN prior to administration.  Acknowledgement of informed consent for chemotherapy administration verified.  Original order, appropriateness of regimen, drug supplied, height, weight, BSA, dose calculations, expiration dates/times, drug appearance, and two patient identifiers were verified by both RNs.  Drug checked for vesicant/irritant status and for risk of hypersensitivity.  Most recent laboratory values and allergies, were reviewed.  Positive, brisk blood return via CVC was confirmed prior to administration. Chest x-ray for correct line placement reviewed. Nathaniel Man and Guerry Bruin, RN verified correct rate of chemotherapy and maintenance IV fluids.  Patient was educated on chemotherapy regimen prior to administration including indication for treatment related to disease & side effects of chemotherapy drug.  Patient verbalizes understanding of all instructions.     Monitoring during infusion done per policy, see Flowsheets.  Blood return verified before, duringper policy; no signs of extravasation.  Pt tolerating chemotherapy well and without incident.  Will continue to monitor.

## 2018-01-15 NOTE — Progress Notes (Signed)
[facility] Progress Note    01/15/2018     David Watkins    MRN: [id number]    DOB: 1956-04-07    SUBJECTIVE:  Pain improved, taking the oxycodone Q 6 H with Duragesic patch.  Walking today.    ECOG PS:  (2) Ambulatory and capable of self care, unable to carry out work activity, up and about > 50% or waking hours    Isolation: None    Medications    Scheduled Meds:  ??? insulin lispro  0-12 Units Subcutaneous TID WC   ??? insulin lispro  0-6 Units Subcutaneous Nightly   ??? [START ON 01/17/2018] dexamethasone  8 mg Oral 2 times per day   ??? [START ON 01/19/2018] dexamethasone  4 mg Oral 2 times per day   ??? albuterol  2.5 mg Nebulization Q4H WA   ??? enoxaparin  40 mg Subcutaneous QPM   ??? fentaNYL  1 patch Transdermal Q72H   ??? ondansetron  24 mg Oral Q24H   ??? dexamethasone  40 mg Oral Q24H   ??? cyclophosphamide/etoposide/CISplatin chemo infusion   Intravenous Q24H   ??? furosemide  40 mg Intravenous Q12H   ??? fluconazole  200 mg Oral Daily   ??? gabapentin  300 mg Oral TID   ??? pantoprazole  40 mg Oral QAM AC   ??? sertraline  50 mg Oral Daily   ??? valACYclovir  500 mg Oral BID   ??? sodium chloride flush  10 mL Intravenous 2 times per day   ??? Saline Mouthwash  15 mL Swish & Spit 4x Daily AC & HS   ??? budesonide  0.5 mg Nebulization BID   ??? formoterol  20 mcg Nebulization BID   ??? tiotropium  18 mcg Inhalation Daily   ??? calcium elemental  500 mg Oral BID     Continuous Infusions:  ??? dextrose     ??? sodium chloride 1,000 mL (01/15/18 0906)   ??? sodium chloride       PRN Meds:.glucose, dextrose, glucagon (rDNA), dextrose, ipratropium-albuterol, oxyCODONE, prochlorperazine **OR** prochlorperazine, LORazepam **OR** LORazepam, ondansetron, sodium chloride, sodium chloride flush, potassium chloride, magnesium hydroxide, Saline Mouthwash, alteplase, LORazepam    ROS:  As noted above, otherwise remainder of 10-point ROS negative    Physical Exam:     I&O:      Intake/Output Summary (Last 24 hours) at 01/15/2018 1114  Last data filed at 01/15/2018  0837  Gross per 24 hour   Intake 4483 ml   Output 4225 ml   Net 258 ml       Vital Signs:  BP 129/89    Pulse 80    Temp 98 ??F (36.7 ??C) (Oral)    Resp 16    Ht 5' 9"  (1.753 m)    Wt 198 lb 9.6 oz (90.1 kg)    SpO2 94%    BMI 29.33 kg/m??     Weight:    Wt Readings from Last 3 Encounters:   01/15/18 198 lb 9.6 oz (90.1 kg)   01/10/18 194 lb 12.8 oz (88.4 kg)   12/06/17 195 lb (88.5 kg)         General: Awake, alert and oriented.  HEENT: normocephalic, alopecia, PERRL, no scleral erythema or icterus, Oral mucosa moist and intact, thrush in posterior pharynx   LYMPH:  Left axillary lymphadenopathy   NECK: supple without palpable adenopathy  BACK: Straight negative CVAT  SKIN: warm dry and intact without lesions rashes or masses  CHEST: Diffuse rhonchi  w/ expiratory wheeze, without use of accessory muscles  CV: Normal S1 S2, RRR, no MRG  ABD: NT ND normoactive BS, no palpable masses or hepatosplenomegaly  EXTREMITIES: without edema, denies calf tenderness  NEURO: CN II - XII grossly intact  CATHETER: Right IJ chest Port (06/21/17, [facility]) - CDI    Data    CBC:   Recent Labs     01/13/18  0235 01/14/18  0303 01/15/18  0400   WBC 10.6 8.5 8.0   HGB 10.0* 10.2* 9.3*   HCT 30.3* 30.7* 27.7*   MCV 94.7 95.0 95.1   PLT 122* 121* 93*     BMP/Mag:  Recent Labs     01/13/18  0235 01/14/18  0303 01/15/18  0400   NA 134* 134* 136   K 4.7 4.6 4.2   CL 97* 98* 100   CO2 24 22 24    PHOS 4.6  --   --    BUN 33* 32* 30*   CREATININE 0.8* 0.8* 0.7*   MG 2.00  --   --      LIVP:   Recent Labs     01/13/18  0235   AST 31   ALT 40   BILIDIR <0.2   BILITOT 0.3   ALKPHOS 68     Coags:   Recent Labs     01/12/18  1624 01/13/18  0235   PROTIME 12.7 12.4   INR 1.11 1.09   APTT  --  24.1*     Uric Acid   Recent Labs     01/13/18  0235   LABURIC 3.5       Diagnostics:  1.  PET scan (01/10/18):    ??  Myeloma Labs (01/03/18):  SPEP:  reveals that M2, previously characterized as monoclonal IgG kappa in  mid-to-slow gamma is 1.0 gm/dL, greater than  the 0.2 gm/dL observed on 08 Nov 2017.   SIFE:  Serum immunofixation electrophoresis reveals persistent M2 in mid-to-slow gamma, a monoclonal IgG kappa.  A recent M-spike, M1, was minor monoclonal IgG lambda in mid-gamma; M1 continues to be absent.  SFLC:  Kappa:  30.80, previously (11/08/17) - 10.40  Lambda:  1.46, previously (11/08/17) - 5.97  Free Kappa Lambda Ratio:  21.10, previously (11/08/17) - 1.74  Quantitative Immunoglobulins:  IgG:   1480, previously (11/08/17) - 552  IgA:   <26, previously (11/08/17) - 30  IgM:   <20  ??  PROBLEM LIST: ??????????   ????  1.  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  Peripheral neuropathy  3.  Anxiety/Depression  4.  Hyperlipidemia  5.  Hypertension  6.  Insomnia  7.  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  COPD  9.  Influenza A (10/12/17)  ????  TREATMENT: ??????????   ??  1. Rad Tx to T5-7, T10-L3, Right Scapula - 3000 cGy - Dr. Jamas Lav 03/20/16-04/01/16  2. RVD x1 03/20/16 - discontinued d/t rash   3. Velcade/Pomalyst/Dex x3 cycles 04/23/16-06/17/16 - discontinued d/t reaction to pomalyst  4. Velcade/Dex x 1 cycle 06/26/16 (last dose of dexamethasone 07/22/16  5. High-dose melphalan followed by administration of PBSCs 2.36 x10^6 cd34cells/kg on 08/28/16  6. Maintenance Revlimid 69m daily (12/2016-04/23/17)  7. Dexamethasone 421mdaily (04/24/17)  8. DCEP   Cycle #1 - 04/26/17  Cycle #2 - 05/25/17 - excellent response  9.??Dara/Velcade/Dex (C1D1 - 06/22/17) - BMBx ??10/18/17 - CR  Cycles 7-8 (21 day cycle)  - Dex 20 mg po D4,5.8,9,11 and 12  - Dara 16 mg/Kg D1 only with Dex 20 mg IV  - Velcade 1.3 mg/M2 D1,4,8 and 11  Cycle 9 and beyond (maintenance, 11/08/17 - 01/03/18)  Dara 16 mg/Kg Q 28 days  Dex 12 mg IV D1  Velcade Q 2 weeks??  10. DCEP Cycle #1 - 01/13/18  ??  ASSESSMENT AND PLAN:??????????   ??  1. ??IgG kappa multiple myeloma / Plasma Cell Leukemia:??Relapsed disease   - S/p treatment Dara/Velcade/Dex x 8 cycles (06/22/17 - 10/2017).  Followed by maintenance Dara Q4 wks/Velcade Q2ks/Dex (started 11/08/17)  - Now w/ progressive disease based on myeloma labs (01/03/18) & PET scan (01/10/18), see above   ??  PLAN: DCEP for disease and pain control. Hope for allogeneic transplant in CR2, but needs to disease response and improvement in lung function   - Cont DCEP    Cycle #1, Day + 3  ??  2. ??ID: Afebrile  - Cont Valtrex prophylaxis   - Cont Diflucan 200 mg daily for thrush (01/13/18)  ??  3. Heme:??Anemia d/t chemotherapy  - Transfuse for Hgb < 7 and Platelets < 10K  - No transfusion today  ??  4.??Metabolic: Stable renal fxn and e-lytes except for hypoNa & steroid-induced hyperglycemia   - Hyperglycemia:  Start medium regimen SSI, AC&HS  - Cont IVF:  NS @ 100 mL/hr & Chemo @ 42 mL/hr  - Replace Mg and K+ per protocol   ??  5. Pulmonary:??No acute issues, but currently on steroid taper for COPD per Dr. Dorann Lodge   - Pulm Nodule: Stable on CT chest  From 04/23/17 & 10/18/17 & 11/26/17; cont to monitor.   - CTPA??(11/26/17): No PE;??resolution of??mild upper lobe tree-in-bud opacities; Mild emphysema. Two 3 mm LLL pulmonary nodules, stable  - PFT (12/27/17):  compared to 07/2016: FVC 3.25 L, 66%, and FEV1  1.42 L, 38%. FEV1/FVC is 44%. This is unchanged from prior study. Air trapping seen on lung volumes. Diffusion capacity 3.91, 79% of predicted, down from 5.21   - Follows up with Dr. Bloomer??for COPD  - Cont??Dex 40 mg daily x 4 doses w/chemo, then he was on 12 mg daily for COPD (started by Bloomer)  - Cont Albuterol inhaler prn & home nebulizer (Rx 10/12/17)  - Consult Bloomer for COPD management   ??  6. GI/Nutrition: Appetite and oral intake is good   - Cont PPI ppx w/ steroids   - Cont low microbial diet ??  - Dietary to follow   ??  7. 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  - Echo (07/13/16): abnormal (paradoxical) septal motion is present with preserved LVEF 55-60%.  - Cont Lipitor   ??  8. Anxiety/Depression: Ongoing  - Cont  Zoloft 50 mg daily   - Cont Ativan PRN  - Psych to follow as needed   ??  9. Insomnia:??ongoing  - He has failed multiple sleep aids in the past. ??  - No complaints currently   ??  10. Peripheral Neuropathy:??Ongoing  - Cont Gabapentin 300 mg TID  ??  11. 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  12.??Acute on Chronic left lower back pain:  d/t Neoplasm and relapsed/progressive disease   - S/p Radiation (09/01/17 - 09/15/17, [facility])  - Cont Fentanyl patch 50 mcg/hr (increased 01/13/18)  - Cont Oxycodone 10 mg q2hrs prn (increased 01/13/18)      - DVT Prophylaxis: Platelets >50,000 cells/dL, - daily lovenox prophylaxis ordered  Contraindications to pharmacologic prophylaxis: None  Contraindications to mechanical prophylaxis: None    - Disposition: Once pain controlled and chemo completes w/out toxicit.  Will need Neulasta 01/19/2018 (not OBI)        David Salts, MD   David Salts, MD  [facility]  Please contact me through [place]

## 2018-01-15 NOTE — Plan of Care (Signed)
Problem: Falls - Risk of:  Goal: Will remain free from falls  Description  Will remain free from falls  01/15/2018 1859 by Annie Sable, RN  Outcome: Ongoing  Orthostatic vital signs obtained at start of shift - see flowsheet for details.  Pt does not meet criteria for orthostasis.  Pt is a Med fall risk. See Leamon Arnt Fall Score and ABCDS Injury Risk assessments.     - Screening for Orthostasis AND not a High Falls Risk per MORSE/ABCDS: Pt bed is in low position, side rails up, call light and belongings are in reach.  Fall risk light is on outside pts room.  Pt encouraged to call for assistance as needed. Will continue with hourly rounds for PO intake, pain needs, toileting and repositioning as needed.    Problem: Pain:  Goal: Pain level will decrease  Description  Pain level will decrease  01/15/2018 1859 by Annie Sable, RN  Outcome: Ongoing  Pt cont to c/o back pain due to disease. Pt w/ fentanyl patch intact. Receiving oxycodone as ordered and reports adequate pain control.  Pt. Able to perform ADLS. Will cont to monitor comfort level.     Problem: Bleeding:  Goal: Will show no signs and symptoms of excessive bleeding  Description  Will show no signs and symptoms of excessive bleeding  01/15/2018 1859 by Annie Sable, RN  Outcome: Ongoing  Patient's hemoglobin this AM:   Recent Labs     01/15/18  0400   HGB 9.3*     Patient's platelet count this AM:   Recent Labs     01/15/18  0400   PLT 93*    Thrombocytopenia not present at this time.  Patient showing no signs or symptoms of active bleeding.  Transfusion not indicated at this time.  Patient verbalizes understanding of all instructions. Will continue to assess and implement POC. Call light within reach and hourly rounding in place.         Problem: Infection - Central Venous Catheter-Associated Bloodstream Infection:  Goal: Will show no infection signs and symptoms  Description  Will show no infection signs and symptoms  Outcome: Ongoing   CVC site  remains free of signs/symptoms of infection. No drainage, edema, erythema, pain, or warmth noted at site. Dressing changes continue per protocol and on an as needed basis - see flowsheet.     Compliant with [facility] Bath Protocol:  Performed CHG bath today per [facility] protocol utilizing CHG solution in the shower.  CVC site cleansed with CHG wipe over dressing, skin surrounding dressing, and first 6" of IV tubing.  Pt tolerated well.  Continued to encourage daily CHG bathing per [facility] protocol.    Problem: Breathing Pattern - Ineffective:  Goal: Ability to achieve and maintain a regular respiratory rate will improve  Description  Ability to achieve and maintain a regular respiratory rate will improve  Outcome: Ongoing   Pt. Denies sob. Exhibits mild DOE. Lungs diminished throughout. Remains wnl on room air. Respiratory following and administering inhaler treatments as ordered.  Will cont to monitor.     Problem: Serum Glucose Level - Abnormal:  Goal: Ability to maintain appropriate glucose levels has stabilized  Description  Ability to maintain appropriate glucose levels has stabilized  Outcome: Ongoing  Pt. W/ elevated blood glucose r/t steroids. Pt. Requiring 2 units insulin as ordered for breakfast and lunch.  Will cont to monitor.

## 2018-01-16 LAB — CBC WITH AUTO DIFFERENTIAL
Basophils %: 0.8 %
Basophils Absolute: 0.1 10*3/uL (ref 0.0–0.2)
Eosinophils %: 0 %
Eosinophils Absolute: 0 10*3/uL (ref 0.0–0.6)
Hematocrit: 28.1 % — ABNORMAL LOW (ref 40.5–52.5)
Hemoglobin: 9.3 g/dL — ABNORMAL LOW (ref 13.5–17.5)
Lymphocytes %: 2.4 %
Lymphocytes Absolute: 0.2 10*3/uL — ABNORMAL LOW (ref 1.0–5.1)
MCH: 31.5 pg (ref 26.0–34.0)
MCHC: 33.2 g/dL (ref 31.0–36.0)
MCV: 94.8 fL (ref 80.0–100.0)
MPV: 8.1 fL (ref 5.0–10.5)
Monocytes %: 4 %
Monocytes Absolute: 0.3 10*3/uL (ref 0.0–1.3)
Neutrophils %: 92.8 %
Neutrophils Absolute: 6.9 10*3/uL (ref 1.7–7.7)
Platelets: 103 10*3/uL — ABNORMAL LOW (ref 135–450)
RBC: 2.96 M/uL — ABNORMAL LOW (ref 4.20–5.90)
RDW: 18.7 % — ABNORMAL HIGH (ref 12.4–15.4)
WBC: 7.4 10*3/uL (ref 4.0–11.0)

## 2018-01-16 LAB — BASIC METABOLIC PANEL
Anion Gap: 14 (ref 3–16)
BUN: 33 mg/dL — ABNORMAL HIGH (ref 7–20)
CO2: 24 mmol/L (ref 21–32)
Calcium: 8.1 mg/dL — ABNORMAL LOW (ref 8.3–10.6)
Chloride: 101 mmol/L (ref 99–110)
Creatinine: 0.7 mg/dL — ABNORMAL LOW (ref 0.9–1.3)
GFR African American: >60 (ref >60)
GFR Non-African American: >60 (ref >60)
Glucose: 192 mg/dL — ABNORMAL HIGH (ref 70–99)
Potassium: 4.2 mmol/L (ref 3.5–5.1)
Sodium: 139 mmol/L (ref 136–145)

## 2018-01-16 LAB — POCT GLUCOSE
POC Glucose: 179 mg/dl — ABNORMAL HIGH (ref 70–99)
POC Glucose: 175 mg/dl — ABNORMAL HIGH (ref 70–99)
POC Glucose: 159 mg/dl — ABNORMAL HIGH (ref 70–99)
POC Glucose: 155 mg/dl — ABNORMAL HIGH (ref 70–99)

## 2018-01-16 MED ORDER — FUROSEMIDE 10 MG/ML IJ SOLN
10 MG/ML | Freq: Two times a day (BID) | INTRAMUSCULAR | Status: DC
Start: 2018-01-16 — End: 2018-01-18
  Administered 2018-01-16 – 2018-01-18 (×5): 40 mg via INTRAVENOUS

## 2018-01-16 MED ORDER — FUROSEMIDE 10 MG/ML IJ SOLN
10 MG/ML | Freq: Two times a day (BID) | INTRAMUSCULAR | Status: DC
Start: 2018-01-16 — End: 2018-01-16

## 2018-01-16 MED ORDER — BISACODYL 5 MG PO TBEC
5 MG | Freq: Every day | ORAL | Status: DC | PRN
Start: 2018-01-16 — End: 2018-01-18
  Administered 2018-01-16: 16:00:00 10 mg via ORAL

## 2018-01-16 MED FILL — VALACYCLOVIR HCL 500 MG PO TABS: 500 mg | ORAL | Qty: 1

## 2018-01-16 MED FILL — ALBUTEROL SULFATE (2.5 MG/3ML) 0.083% IN NEBU: RESPIRATORY_TRACT | Qty: 3

## 2018-01-16 MED FILL — BUDESONIDE 0.5 MG/2ML IN SUSP: 0.5 MG/2ML | RESPIRATORY_TRACT | Qty: 2

## 2018-01-16 MED FILL — ALBUTEROL SULFATE (2.5 MG/3ML) 0.083% IN NEBU: RESPIRATORY_TRACT | Qty: 9

## 2018-01-16 MED FILL — SERTRALINE HCL 50 MG PO TABS: 50 mg | ORAL | Qty: 1

## 2018-01-16 MED FILL — OYSTER SHELL CALCIUM 500 MG PO TABS: 500 mg | ORAL | Qty: 1

## 2018-01-16 MED FILL — LORAZEPAM 0.5 MG PO TABS: 0.5 mg | ORAL | Qty: 1

## 2018-01-16 MED FILL — OXYCODONE HCL 5 MG PO TABS: 5 mg | ORAL | Qty: 2

## 2018-01-16 MED FILL — GABAPENTIN 300 MG PO CAPS: 300 mg | ORAL | Qty: 1

## 2018-01-16 MED FILL — DEXAMETHASONE 4 MG PO TABS: 4 mg | ORAL | Qty: 10

## 2018-01-16 MED FILL — STIMULANT LAXATIVE 5 MG PO TBEC: 5 mg | ORAL | Qty: 2

## 2018-01-16 MED FILL — LOVENOX 40 MG/0.4ML SC SOLN: 40 MG/0.4ML | SUBCUTANEOUS | Qty: 0.4

## 2018-01-16 MED FILL — FUROSEMIDE 10 MG/ML IJ SOLN: 10 mg/mL | INTRAMUSCULAR | Qty: 4

## 2018-01-16 MED FILL — SODIUM CHLORIDE 0.9 % IV SOLN: 0.9 % | INTRAVENOUS | Qty: 1000

## 2018-01-16 MED FILL — FENTANYL 50 MCG/HR TD PT72: 50 ug/h | TRANSDERMAL | Qty: 1

## 2018-01-16 MED FILL — FLUCONAZOLE 200 MG PO TABS: 200 mg | ORAL | Qty: 1

## 2018-01-16 MED FILL — PROCHLORPERAZINE EDISYLATE 10 MG/2ML IJ SOLN: 10 MG/2ML | INTRAMUSCULAR | Qty: 2

## 2018-01-16 MED FILL — ONDANSETRON HCL 4 MG PO TABS: 4 mg | ORAL | Qty: 1

## 2018-01-16 MED FILL — ONDANSETRON HCL 8 MG PO TABS: 8 mg | ORAL | Qty: 3

## 2018-01-16 MED FILL — MILK OF MAGNESIA 7.75 % PO SUSP: 7.75 % | ORAL | Qty: 30

## 2018-01-16 MED FILL — PANTOPRAZOLE SODIUM 40 MG PO TBEC: 40 mg | ORAL | Qty: 1

## 2018-01-16 MED FILL — SODIUM CHLORIDE 0.9 % IV SOLN: 0.9 % | INTRAVENOUS | Qty: 500

## 2018-01-16 MED FILL — PERFOROMIST 20 MCG/2ML IN NEBU: 20 MCG/2ML | RESPIRATORY_TRACT | Qty: 2

## 2018-01-16 NOTE — Progress Notes (Signed)
BCC Progress Note    01/16/2018     David Watkins    MRN: [id number]    DOB: 1956-04-21    SUBJECTIVE:  Pain improved, but more tired today.    ECOG PS:  (2) Ambulatory and capable of self care, unable to carry out work activity, up and about > 50% or waking hours    Isolation: None    Medications    Scheduled Meds:  ??? insulin lispro  0-12 Units Subcutaneous TID WC   ??? insulin lispro  0-6 Units Subcutaneous Nightly   ??? [START ON 01/17/2018] dexamethasone  8 mg Oral 2 times per day   ??? [START ON 01/19/2018] dexamethasone  4 mg Oral 2 times per day   ??? albuterol  2.5 mg Nebulization Q4H WA   ??? enoxaparin  40 mg Subcutaneous QPM   ??? fentaNYL  1 patch Transdermal Q72H   ??? ondansetron  24 mg Oral Q24H   ??? dexamethasone  40 mg Oral Q24H   ??? cyclophosphamide/etoposide/CISplatin chemo infusion   Intravenous Q24H   ??? furosemide  40 mg Intravenous Q12H   ??? fluconazole  200 mg Oral Daily   ??? gabapentin  300 mg Oral TID   ??? pantoprazole  40 mg Oral QAM AC   ??? sertraline  50 mg Oral Daily   ??? valACYclovir  500 mg Oral BID   ??? sodium chloride flush  10 mL Intravenous 2 times per day   ??? Saline Mouthwash  15 mL Swish & Spit 4x Daily AC & HS   ??? budesonide  0.5 mg Nebulization BID   ??? formoterol  20 mcg Nebulization BID   ??? tiotropium  18 mcg Inhalation Daily   ??? calcium elemental  500 mg Oral BID     Continuous Infusions:  ??? dextrose     ??? sodium chloride 1,000 mL (01/16/18 0446)   ??? sodium chloride       PRN Meds:.glucose, dextrose, glucagon (rDNA), dextrose, ipratropium-albuterol, oxyCODONE, prochlorperazine **OR** prochlorperazine, LORazepam **OR** LORazepam, ondansetron, sodium chloride, sodium chloride flush, potassium chloride, magnesium hydroxide, Saline Mouthwash, alteplase, LORazepam    ROS:  As noted above, otherwise remainder of 10-point ROS negative    Physical Exam:     I&O:      Intake/Output Summary (Last 24 hours) at 01/16/2018 1103  Last data filed at 01/16/2018 0953  Gross per 24 hour   Intake 4669 ml   Output  3550 ml   Net 1119 ml       Vital Signs:  BP (!) 158/82    Pulse 92    Temp 97.7 ??F (36.5 ??C) (Oral)    Resp 20    Ht 5\' 9"  (1.753 m)    Wt 198 lb 9.6 oz (90.1 kg)    SpO2 94%    BMI 29.33 kg/m??     Weight:    Wt Readings from Last 3 Encounters:   01/15/18 198 lb 9.6 oz (90.1 kg)   01/10/18 194 lb 12.8 oz (88.4 kg)   12/06/17 195 lb (88.5 kg)         General: Awake, alert and oriented.  HEENT: normocephalic, alopecia, PERRL, no scleral erythema or icterus, Oral mucosa moist and intact, thrush in posterior pharynx   LYMPH:  Left axillary lymphadenopathy   NECK: supple without palpable adenopathy  BACK: Straight negative CVAT  SKIN: warm dry and intact without lesions rashes or masses  CHEST: Diffuse rhonchi w/ expiratory wheeze, without use of accessory  muscles  CV: Normal S1 S2, RRR, no MRG  ABD: NT ND normoactive BS, no palpable masses or hepatosplenomegaly  EXTREMITIES: without edema, denies calf tenderness  NEURO: CN II - XII grossly intact  CATHETER: Right IJ chest Port (06/21/17, [facility]) - CDI    Data    CBC:   Recent Labs     01/14/18  0303 01/15/18  0400 01/16/18  0450   WBC 8.5 8.0 7.4   HGB 10.2* 9.3* 9.3*   HCT 30.7* 27.7* 28.1*   MCV 95.0 95.1 94.8   PLT 121* 93* 103*     BMP/Mag:  Recent Labs     01/14/18  0303 01/15/18  0400 01/16/18  0450   NA 134* 136 139   K 4.6 4.2 4.2   CL 98* 100 101   CO2 22 24 24    BUN 32* 30* 33*   CREATININE 0.8* 0.7* 0.7*     LIVP:   No results for input(s): AST, ALT, LIPASE, BILIDIR, BILITOT, ALKPHOS in the last 72 hours.    Invalid input(s):  AMYLASE,  ALB  Coags:   No results for input(s): PROTIME, INR, APTT in the last 72 hours.  Uric Acid   No results for input(s): LABURIC in the last 72 hours.    Diagnostics:  1.  PET scan (01/10/18):    ??  Myeloma Labs (01/03/18):  SPEP:  reveals that M2, previously characterized as monoclonal IgG kappa in  mid-to-slow gamma is 1.0 gm/dL, greater than the 0.2 gm/dL observed on 08 Nov 4740.   SIFE:  Serum immunofixation electrophoresis  reveals persistent M2 in mid-to-slow gamma, a monoclonal IgG kappa.  A recent M-spike, M1, was minor monoclonal IgG lambda in mid-gamma; M1 continues to be absent.  SFLC:  Kappa:  30.80, previously (11/08/17) - 10.40  Lambda:  1.46, previously (11/08/17) - 5.97  Free Kappa Lambda Ratio:  21.10, previously (11/08/17) - 1.74  Quantitative Immunoglobulins:  IgG:   1480, previously (11/08/17) - 552  IgA:   <26, previously (11/08/17) - 30  IgM:   <20  ??  PROBLEM LIST: ??????????   ????  1.  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  Peripheral neuropathy  3.  Anxiety/Depression  4.  Hyperlipidemia  5.  Hypertension  6.  Insomnia  7.  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  COPD  9.  Influenza A (10/12/17)  ????  TREATMENT: ??????????   ??  1. Rad Tx to T5-7, T10-L3, Right Scapula - 3000 cGy - Dr. Bonita Quin 03/20/16-04/01/16  2. RVD x1 03/20/16 - discontinued d/t rash   3. Velcade/Pomalyst/Dex x3 cycles 04/23/16-06/17/16 - discontinued d/t reaction to pomalyst  4. Velcade/Dex x 1 cycle 06/26/16 (last dose of dexamethasone 07/22/16  5. High-dose melphalan followed by administration of PBSCs 2.36 x10^6 cd34cells/kg on 08/28/16  6. Maintenance Revlimid 10mg  daily (12/2016-04/23/17)  7. Dexamethasone 40mg  daily (04/24/17)  8. DCEP   Cycle #1 - 04/26/17  Cycle #2 - 05/25/17 - excellent response  9.??Dara/Velcade/Dex (C1D1 - 06/22/17) - BMBx ??10/18/17 - CR  Cycles 7-8 (21 day cycle)  - Dex 20 mg po D4,5.8,9,11 and 12  - Dara 16 mg/Kg D1 only with Dex 20 mg IV  - Velcade 1.3 mg/M2 D1,4,8 and 11  Cycle 9 and beyond (maintenance, 11/08/17 - 01/03/18)  Dara 16 mg/Kg Q 28 days  Dex 12 mg IV D1  Velcade Q 2  weeks??  10. DCEP Cycle #1 - 01/13/18  ??  ASSESSMENT AND PLAN:??????????   ??  1. ??IgG kappa multiple myeloma / Plasma Cell Leukemia:??Relapsed disease   - S/p treatment Dara/Velcade/Dex x 8 cycles (06/22/17 - 10/2017). Followed by maintenance Dara Q4 wks/Velcade Q2ks/Dex (started 11/08/17)  - Now w/  progressive disease based on myeloma labs (01/03/18) & PET scan (01/10/18), see above   ??  PLAN: DCEP for disease and pain control. Hope for allogeneic transplant in CR2, but needs to disease response and improvement in lung function   - Cont DCEP    Cycle #1, Day + 4  ??  2. ??ID: Afebrile  - Cont Valtrex prophylaxis   - Cont Diflucan 200 mg daily for thrush (01/13/18)  ??  3. Heme:??Anemia d/t chemotherapy  - Transfuse for Hgb < 7 and Platelets < 10K  - No transfusion today  ??  4.??Metabolic: Stable renal fxn and e-lytes except for hypoNa & steroid-induced hyperglycemia   - Hyperglycemia:  Start medium regimen SSI, AC&HS  - Cont IVF:  NS @ 100 mL/hr & Chemo @ 42 mL/hr  - Replace Mg and K+ per protocol  - Lasix 40 mg IV Q 12 H 01/16/2018  - Dulcolax 10 mg 01/16/2018  ??  5. Pulmonary:??No acute issues, but currently on steroid taper for COPD per Dr. Karie Kirks   - Pulm Nodule: Stable on CT chest  From 04/23/17 & 10/18/17 & 11/26/17; cont to monitor.   - CTPA??(11/26/17): No PE;??resolution of??mild upper lobe tree-in-bud opacities; Mild emphysema. Two 3 mm LLL pulmonary nodules, stable  - PFT (12/27/17):  compared to 07/2016: FVC 3.25 L, 66%, and FEV1  1.42 L, 38%. FEV1/FVC is 44%. This is unchanged from prior study. Air trapping seen on lung volumes. Diffusion capacity 3.91, 79% of predicted, down from 5.21   - Follows up with Dr. Bloomer??for COPD  - Cont??Dex 40 mg daily x 4 doses w/chemo, then he was on 12 mg daily for COPD (started by Bloomer)  - Cont Albuterol inhaler prn & home nebulizer (Rx 10/12/17)  - Consult Bloomer for COPD management   ??  6. GI/Nutrition: Appetite and oral intake is good   - Cont PPI ppx w/ steroids   - Cont low microbial diet ??  - Dietary to follow   ??  7. 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  - Echo (07/13/16): abnormal (paradoxical) septal motion is present with preserved LVEF 55-60%.  - Cont Lipitor   ??  8. Anxiety/Depression: Ongoing  - Cont Zoloft 50 mg daily   -  Cont Ativan PRN  - Psych to follow as needed   ??  9. Insomnia:??ongoing  - He has failed multiple sleep aids in the past. ??  - No complaints currently   ??  10. Peripheral Neuropathy:??Ongoing  - Cont Gabapentin 300 mg TID  ??  11. 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  12.??Acute on Chronic left lower back pain:  d/t Neoplasm and relapsed/progressive disease   - S/p Radiation (09/01/17 - 09/15/17, [facility] [place])  - Cont Fentanyl patch 50 mcg/hr (increased 01/13/18)  - Cont Oxycodone 10 mg q2hrs prn (increased 01/13/18)      - DVT Prophylaxis: Platelets >50,000 cells/dL, -  daily lovenox prophylaxis ordered  Contraindications to pharmacologic prophylaxis: None  Contraindications to mechanical prophylaxis: None    - Disposition: Once pain controlled and chemo completes w/out toxicit.  Will need Neulasta 01/19/2018 (not OBI)        Emilee Hero, MD   Emilee Hero, MD  [facility]  Please contact me through [employer]

## 2018-01-16 NOTE — Progress Notes (Signed)
Administration: Chemotherapy drug Cytoxan/Etoposide/Cisplatin BAG # 4 independently verified with Ephriam Knuckles, RN prior to administration.  Acknowledgement of informed consent for chemotherapy administration verified.  Original order, appropriateness of regimen, drug supplied, height, weight, BSA, dose calculations, expiration dates/times, drug appearance, and two patient identifiers were verified by both RNs.  Drug checked for vesicant/irritant status and for risk of hypersensitivity.  Most recent laboratory values and allergies, were reviewed.  Positive, brisk blood return via CVC was confirmed prior to administration. Chest x-ray for correct line placement reviewed. Annie Sable and Genoveva Ill, RN verified correct rate of chemotherapy and maintenance IV fluids.  Patient was educated on chemotherapy regimen prior to administration including indication for treatment related to disease & side effects of chemotherapy drug.  Patient verbalizes understanding of all instructions.    Monitoring during infusion done per policy, see Flowsheets.  Blood return verified before, during, and after infusion per policy; no signs of extravasation.  Pt tolerating chemotherapy well and without incident. Will continue to monitor.

## 2018-01-16 NOTE — Plan of Care (Signed)
Problem: Falls - Risk of:  Goal: Will remain free from falls  Description  Will remain free from falls  Outcome: Ongoing   Orthostatic vital signs obtained at start of shift - see flowsheet for details.  Pt does not meet criteria for orthostasis.  Pt is a Med fall risk. See Leamon Arnt Fall Score and ABCDS Injury Risk assessments.   - Screening for Orthostasis AND not a High Falls Risk per MORSE/ABCDS: Pt bed is in low position, side rails up, call light and belongings are in reach.  Fall risk light is on outside pts room.  Pt encouraged to call for assistance as needed. Will continue with hourly rounds for PO intake, pain needs, toileting and repositioning as needed.      Problem: Pain:  Goal: Pain level will decrease  Description  Pain level will decrease  Outcome: Ongoing   Pt w/ chronic back pain. Fentanyl patch replaced and CDI on L shoulderblade. Pt also receiving oxycodone as ordered prn and reports adequate pain control w/ current interventions. Will cont to monitor.  Problem: Bleeding:  Goal: Will show no signs and symptoms of excessive bleeding  Description  Will show no signs and symptoms of excessive bleeding  Outcome: Ongoing   Patient's hemoglobin this AM:   Recent Labs     01/16/18  0450   HGB 9.3*     Patient's platelet count this AM:   Recent Labs     01/16/18  0450   PLT 103*    Thrombocytopenia Precautions in place.  Patient showing no signs or symptoms of active bleeding.  Transfusion not indicated at this time.  Patient verbalizes understanding of all instructions. Will continue to assess and implement POC. Call light within reach and hourly rounding in place.    Problem: Infection - Central Venous Catheter-Associated Bloodstream Infection:  Goal: Will show no infection signs and symptoms  Description  Will show no infection signs and symptoms  Outcome: Ongoing     Problem: Breathing Pattern - Ineffective:  Goal: Ability to achieve and maintain a regular respiratory rate will  improve  Description  Ability to achieve and maintain a regular respiratory rate will improve  Outcome: Ongoing   Pt. Continues to receive breathing treatments per respiratory as ordered. Lungs w/ expiratory wheezes throughout. Pt w/ doe. Not requiring supp. Oxygen. Will cont to monitor.    Problem: Serum Glucose Level - Abnormal:  Goal: Ability to maintain appropriate glucose levels has stabilized  Description  Ability to maintain appropriate glucose levels has stabilized  Outcome: Ongoing  Pt w. Elevated glucose levels due to steroids. Requiring 2 units of insulin w/ meals per sliding scale order. Will cont to monitor.

## 2018-01-16 NOTE — Plan of Care (Signed)
Problem: Falls - Risk of:  Goal: Will remain free from falls  Description  Will remain free from falls  01/16/2018 0159 by Nadean Corwin, RN  Outcome: Ongoing  Note:   Orthostatic vital signs obtained at start of shift - see flowsheet for details.  Pt does not meet criteria for orthostasis.  Pt is a Med fall risk. See Lattie Corns Fall Score and ABCDS Injury Risk assessments.   - Screening for Orthostasis AND not a High Falls Risk per MORSE/ABCDS: Pt bed is in low position, side rails up, call light and belongings are in reach.  Fall risk light is on outside pts room.  Pt encouraged to call for assistance as needed. Will continue with hourly rounds for PO intake, pain needs, toileting and repositioning as needed.      Problem: Pain:  Goal: Pain level will decrease  Description  Pain level will decrease  01/16/2018 0159 by Nadean Corwin, RN  Outcome: Ongoing  Note:   Pt complaining of chronic back pain.  Pt given oxy as ordered and was asleep on reassessment.    Problem: Bleeding:  Goal: Will show no signs and symptoms of excessive bleeding  Description  Will show no signs and symptoms of excessive bleeding  01/16/2018 0159 by Nadean Corwin, RN  Outcome: Ongoing    Problem: Infection - Central Venous Catheter-Associated Bloodstream Infection:  Goal: Will show no infection signs and symptoms  Description  Will show no infection signs and symptoms  01/16/2018 0159 by Nadean Corwin, RN  Outcome: Ongoing  Note:   CVC site remains free of signs/symptoms of infection. No drainage, edema, erythema, pain, or warmth noted at site. Dressing changes continue per protocol and on an as needed basis - see flowsheet.     Compliant with BCC Bath Protocol:  Performed CHG bath today per BCC protocol utilizing CHG solution in the shower this shift.  CVC site cleansed with CHG wipe over dressing, skin surrounding dressing, and first 6" of IV tubing.  Pt tolerated well.  Continued to encourage daily CHG bathing per [facility]  protocol.      Problem: Serum Glucose Level - Abnormal:  Goal: Ability to maintain appropriate glucose levels has stabilized  Description  Ability to maintain appropriate glucose levels has stabilized  01/16/2018 0159 by Nadean Corwin, RN  Outcome: Ongoing  Note:   Pt blood glucose covered per sliding scale.

## 2018-01-16 NOTE — Plan of Care (Signed)
Problem: Fluid Volume:  Goal: Hemodynamic stability will improve  Description  Hemodynamic stability will improve  Outcome: Ongoing   Pt. W/ approx 4 lb weight gain from yesterday. MD aware. Pt receiving scheduled iv lasix as ordered BID. Pt. Diuresing well. Pt also w/ elevated BP -MD aware. Will cont to monitor as pt continues to diures. Trace edema to bil ankles/feet. Abdomen taut and distended.

## 2018-01-16 NOTE — Plan of Care (Signed)
Problem: Bowel/Gastric:  Goal: Ability to achieve a regular elimination pattern will improve  Description  Ability to achieve a regular elimination pattern will improve  Outcome: Ongoing   Pt reports last bm "3 days ago". Denies passing gas. + bowel sounds. Abdomen taut and distended. Non tender. Received mom last pm. Received dulcolax as ordered this morning. Pt states he has small firm bm. Will cont to monitor and assess for further intervention.

## 2018-01-17 ENCOUNTER — Encounter: Payer: BLUE CROSS/BLUE SHIELD | Attending: Pulmonary Disease | Primary: Hematology & Oncology

## 2018-01-17 LAB — HEPATIC FUNCTION PANEL
ALT: 58 U/L — ABNORMAL HIGH (ref 10–40)
AST: 28 U/L (ref 15–37)
Albumin: 3.4 g/dL (ref 3.4–5.0)
Alkaline Phosphatase: 54 U/L (ref 40–129)
Bilirubin, Direct: <0.2 mg/dL (ref 0.0–0.3)
Total Bilirubin: 0.3 mg/dL (ref 0.0–1.0)
Total Protein: 6.7 g/dL (ref 6.4–8.2)

## 2018-01-17 LAB — BASIC METABOLIC PANEL
Anion Gap: 12 (ref 3–16)
BUN: 33 mg/dL — ABNORMAL HIGH (ref 7–20)
CO2: 27 mmol/L (ref 21–32)
Calcium: 8 mg/dL — ABNORMAL LOW (ref 8.3–10.6)
Chloride: 95 mmol/L — ABNORMAL LOW (ref 99–110)
Creatinine: 0.7 mg/dL — ABNORMAL LOW (ref 0.9–1.3)
GFR African American: >60 (ref >60)
GFR Non-African American: >60 (ref >60)
Glucose: 208 mg/dL — ABNORMAL HIGH (ref 70–99)
Potassium: 4 mmol/L (ref 3.5–5.1)
Sodium: 134 mmol/L — ABNORMAL LOW (ref 136–145)

## 2018-01-17 LAB — PROTIME-INR
INR: 1.08 (ref 0.86–1.14)
Protime: 12.3 s (ref 9.8–13.0)

## 2018-01-17 LAB — CBC WITH AUTO DIFFERENTIAL
Basophils %: 0.5 %
Basophils Absolute: 0 10*3/uL (ref 0.0–0.2)
Eosinophils %: 0 %
Eosinophils Absolute: 0 10*3/uL (ref 0.0–0.6)
Hematocrit: 27.6 % — ABNORMAL LOW (ref 40.5–52.5)
Hemoglobin: 9.3 g/dL — ABNORMAL LOW (ref 13.5–17.5)
Lymphocytes %: 0.9 %
Lymphocytes Absolute: 0.1 10*3/uL — ABNORMAL LOW (ref 1.0–5.1)
MCH: 31.6 pg (ref 26.0–34.0)
MCHC: 33.6 g/dL (ref 31.0–36.0)
MCV: 93.9 fL (ref 80.0–100.0)
MPV: 7.6 fL (ref 5.0–10.5)
Monocytes %: 3 %
Monocytes Absolute: 0.2 10*3/uL (ref 0.0–1.3)
Neutrophils %: 95.6 %
Neutrophils Absolute: 7.3 10*3/uL (ref 1.7–7.7)
Platelets: 91 10*3/uL — ABNORMAL LOW (ref 135–450)
RBC: 2.94 M/uL — ABNORMAL LOW (ref 4.20–5.90)
RDW: 19 % — ABNORMAL HIGH (ref 12.4–15.4)
WBC: 7.7 10*3/uL (ref 4.0–11.0)

## 2018-01-17 LAB — POCT GLUCOSE
POC Glucose: 246 mg/dl — ABNORMAL HIGH (ref 70–99)
POC Glucose: 183 mg/dl — ABNORMAL HIGH (ref 70–99)
POC Glucose: 139 mg/dl — ABNORMAL HIGH (ref 70–99)
POC Glucose: 174 mg/dl — ABNORMAL HIGH (ref 70–99)

## 2018-01-17 LAB — URINALYSIS
Bilirubin Urine: NEGATIVE
Blood, Urine: NEGATIVE
Glucose, Ur: 250 mg/dL — AB
Ketones, Urine: NEGATIVE mg/dL
Leukocyte Esterase, Urine: NEGATIVE
Nitrite, Urine: NEGATIVE
Specific Gravity, UA: 1.02 (ref 1.005–1.030)
Urobilinogen, Urine: 0.2 E.U./dL (ref <2.0)
pH, UA: 6 (ref 5.0–8.0)

## 2018-01-17 LAB — MICROSCOPIC URINALYSIS

## 2018-01-17 LAB — PREPARE RBC (CROSSMATCH)

## 2018-01-17 LAB — URIC ACID: Uric Acid, Serum: 4.5 mg/dL (ref 3.5–7.2)

## 2018-01-17 LAB — LACTATE DEHYDROGENASE: LD: 269 U/L — ABNORMAL HIGH (ref 100–190)

## 2018-01-17 LAB — PHOSPHORUS: Phosphorus: 3.3 mg/dL (ref 2.5–4.9)

## 2018-01-17 LAB — MAGNESIUM: Magnesium: 2.5 mg/dL — ABNORMAL HIGH (ref 1.80–2.40)

## 2018-01-17 LAB — APTT: aPTT: 23.8 s — ABNORMAL LOW (ref 26.0–36.0)

## 2018-01-17 MED ORDER — SENNA-DOCUSATE SODIUM 8.6-50 MG PO TABS
Freq: Every day | ORAL | Status: DC
Start: 2018-01-17 — End: 2018-01-18
  Administered 2018-01-17 – 2018-01-18 (×2): 2 via ORAL

## 2018-01-17 MED ORDER — OXYCODONE HCL 5 MG PO TABS
5 MG | ORAL | Status: DC | PRN
Start: 2018-01-17 — End: 2018-01-18
  Administered 2018-01-17 – 2018-01-18 (×4): 10 mg via ORAL

## 2018-01-17 MED FILL — ALBUTEROL SULFATE (2.5 MG/3ML) 0.083% IN NEBU: RESPIRATORY_TRACT | Qty: 3

## 2018-01-17 MED FILL — ONDANSETRON HCL 8 MG PO TABS: 8 mg | ORAL | Qty: 3

## 2018-01-17 MED FILL — SODIUM CHLORIDE 0.9 % IV SOLN: 0.9 % | INTRAVENOUS | Qty: 1000

## 2018-01-17 MED FILL — SENNOSIDES-DOCUSATE SODIUM 8.6-50 MG PO TABS: 8.6-50 mg | ORAL | Qty: 2

## 2018-01-17 MED FILL — LOVENOX 40 MG/0.4ML SC SOLN: 40 MG/0.4ML | SUBCUTANEOUS | Qty: 0.4

## 2018-01-17 MED FILL — VALACYCLOVIR HCL 500 MG PO TABS: 500 mg | ORAL | Qty: 1

## 2018-01-17 MED FILL — PERFOROMIST 20 MCG/2ML IN NEBU: 20 MCG/2ML | RESPIRATORY_TRACT | Qty: 2

## 2018-01-17 MED FILL — OYSTER SHELL CALCIUM 500 MG PO TABS: 500 mg | ORAL | Qty: 1

## 2018-01-17 MED FILL — OXYCODONE HCL 5 MG PO TABS: 5 mg | ORAL | Qty: 2

## 2018-01-17 MED FILL — GABAPENTIN 300 MG PO CAPS: 300 mg | ORAL | Qty: 1

## 2018-01-17 MED FILL — BUDESONIDE 0.5 MG/2ML IN SUSP: 0.5 MG/2ML | RESPIRATORY_TRACT | Qty: 2

## 2018-01-17 MED FILL — FUROSEMIDE 10 MG/ML IJ SOLN: 10 mg/mL | INTRAMUSCULAR | Qty: 4

## 2018-01-17 MED FILL — DEXAMETHASONE 4 MG PO TABS: 4 mg | ORAL | Qty: 2

## 2018-01-17 MED FILL — SERTRALINE HCL 50 MG PO TABS: 50 mg | ORAL | Qty: 1

## 2018-01-17 MED FILL — MILK OF MAGNESIA 7.75 % PO SUSP: 7.75 % | ORAL | Qty: 30

## 2018-01-17 MED FILL — FLUCONAZOLE 200 MG PO TABS: 200 mg | ORAL | Qty: 1

## 2018-01-17 MED FILL — PANTOPRAZOLE SODIUM 40 MG PO TBEC: 40 mg | ORAL | Qty: 1

## 2018-01-17 MED FILL — SPIRIVA HANDIHALER 18 MCG IN CAPS: 18 ug | RESPIRATORY_TRACT | Qty: 5

## 2018-01-17 NOTE — Oncology Nurse Navigation (Signed)
Completion of Chemotherapy: Monitoring during infusion done per policy, see Flowsheets.  Blood return verified before, during, and after infusion per policy; no signs of extravasation.  Pt {tolerated chemotherapy well and without incident.  Chemotherapy infusion end time on the MAR.  Will continue to monitor.

## 2018-01-17 NOTE — Plan of Care (Signed)
Problem: Falls - Risk of:  Goal: Will remain free from falls  Description  Will remain free from falls  01/17/2018 0533 by Laurice Record, RN  Outcome: Met This Shift  Note:   Orthostatic vital signs obtained at start of shift - see flowsheet for details.  Pt does not meet criteria for orthostasis.  Pt is a Med fall risk. See Lattie Corns Fall Score and ABCDS Injury Risk assessments.     - Screening for Orthostasis AND not a High Falls Risk per MORSE/ABCDS: Pt bed is in low position, side rails up, call light and belongings are in reach.  Fall risk light is on outside pts room.  Pt encouraged to call for assistance as needed. Will continue with hourly rounds for PO intake, pain needs, toileting and repositioning as needed.        Problem: Bleeding:  Goal: Will show no signs and symptoms of excessive bleeding  Description  Will show no signs and symptoms of excessive bleeding  01/17/2018 0533 by Laurice Record, RN  Outcome: Met This Shift  Note:   Patient's hemoglobin this AM:   Recent Labs     01/17/18  0405   HGB 9.3*     Patient's platelet count this AM:   Recent Labs     01/17/18  0405   PLT 91*    Thrombocytopenia Precautions in place.  Patient showing no signs or symptoms of active bleeding.  Transfusion not indicated at this time.  Patient verbalizes understanding of all instructions. Will continue to assess and implement POC. Call light within reach and hourly rounding in place.         Problem: Infection - Central Venous Catheter-Associated Bloodstream Infection:  Goal: Will show no infection signs and symptoms  Description  Will show no infection signs and symptoms  01/17/2018 0533 by Laurice Record, RN  Outcome: Met This Shift  Note:   CVC site remains free of signs/symptoms of infection. No drainage, edema, erythema, pain, or warmth noted at site. Dressing changes continue per protocol and on an as needed basis - see flowsheet.     Compliant with BCC Bath Protocol:  Performed CHG bath today per BCC  protocol utilizing CHG solution in the shower.  CVC site cleansed with CHG wipe over dressing, skin surrounding dressing, and first 6" of IV tubing.  Pt tolerated well.  Continued to encourage daily CHG bathing per [facility] protocol.           Problem: Breathing Pattern - Ineffective:  Goal: Ability to achieve and maintain a regular respiratory rate will improve  Description  Ability to achieve and maintain a regular respiratory rate will improve  01/17/2018 0533 by Laurice Record, RN  Outcome: Met This Shift  Note:   No complaints of breathing difficulties. Pt remains on room air. Will continue to monitor.      Problem: Serum Glucose Level - Abnormal:  Goal: Ability to maintain appropriate glucose levels has stabilized  Description  Ability to maintain appropriate glucose levels has stabilized  01/17/2018 0533 by Laurice Record, RN  Outcome: Met This Shift  Note:   Pt accu checks AC/HS. Pt covered per sliding scale ordered. Pt shows no s/s of hypo or hyper glycemia. Will continue to monitor.      Problem: Pain:  Description  Pain management should include both nonpharmacologic and pharmacologic interventions.  Goal: Pain level will decrease  Description  Pain level will decrease  01/17/2018 0533 by Laurice Record, RN  Outcome: Ongoing  Note:   Pt having ongoing back pain. PRN oxycodone administered as ordered, see MAR. Pt currently in bed with eyes closed. RR 16-20. No s/s of pain or distress. Will continue to monitor.      Problem: Bowel/Gastric:  Goal: Ability to achieve a regular elimination pattern will improve  Description  Ability to achieve a regular elimination pattern will improve  01/17/2018 0533 by Laurice Record, RN  Note:   Pt had x1 bm this shift, per pt report. Pt still complaining of bloating/constipation-milk of mag PRN administered per orders. No further bowel movements reported. Will continue to monitor.      Problem: Fluid Volume:  Description  [TRUNCATED] Hyponatremia indicates a relatively  greater amount of water to sodium in plasma. In hypovolemia, a deficit of both total body water and sodium exists but relatively less water deficit. Euvolemia represents near normal total body sodium co ..Marland Kitchen[TRUNCATED] Hyponatremia indicates a relatively greater amount of water to sodium in plasma. In hypovolemia, a deficit of both total body water and sodium exists but relatively less water deficit. Euvolemia represents near normal total body sodium co ...  Goal: Hemodynamic stability will improve  Description  Hemodynamic stability will improve  01/17/2018 0533 by Laurice Record, RN  Outcome: Ongoing  Note:   Pt on scheduled lasix as ordered per [facility]. Pt having good output in clear urine. Will continue to monitor.

## 2018-01-17 NOTE — Progress Notes (Signed)
B[facility] Progress Note    01/17/2018     ENCARNACION DEGRUY    MRN: [id number]    DOB: 1956-08-26    SUBJECTIVE:  Pain controlled and breathing better.    ECOG PS:  (2) Ambulatory and capable of self care, unable to carry out work activity, up and about > 50% or waking hours    Isolation: None    Medications    Scheduled Meds:  ??? sennosides-docusate sodium  2 tablet Oral Daily   ??? furosemide  40 mg Intravenous BID   ??? insulin lispro  0-12 Units Subcutaneous TID WC   ??? insulin lispro  0-6 Units Subcutaneous Nightly   ??? dexamethasone  8 mg Oral 2 times per day   ??? [START ON 01/19/2018] dexamethasone  4 mg Oral 2 times per day   ??? albuterol  2.5 mg Nebulization Q4H WA   ??? enoxaparin  40 mg Subcutaneous QPM   ??? fentaNYL  1 patch Transdermal Q72H   ??? ondansetron  24 mg Oral Q24H   ??? cyclophosphamide/etoposide/CISplatin chemo infusion   Intravenous Q24H   ??? fluconazole  200 mg Oral Daily   ??? gabapentin  300 mg Oral TID   ??? pantoprazole  40 mg Oral QAM AC   ??? sertraline  50 mg Oral Daily   ??? valACYclovir  500 mg Oral BID   ??? sodium chloride flush  10 mL Intravenous 2 times per day   ??? Saline Mouthwash  15 mL Swish & Spit 4x Daily AC & HS   ??? budesonide  0.5 mg Nebulization BID   ??? formoterol  20 mcg Nebulization BID   ??? tiotropium  18 mcg Inhalation Daily   ??? calcium elemental  500 mg Oral BID     Continuous Infusions:  ??? dextrose     ??? sodium chloride 1,000 mL (01/17/18 0244)   ??? sodium chloride       PRN Meds:.bisacodyl, glucose, dextrose, glucagon (rDNA), dextrose, ipratropium-albuterol, oxyCODONE, prochlorperazine **OR** prochlorperazine, LORazepam **OR** LORazepam, ondansetron, sodium chloride, sodium chloride flush, potassium chloride, magnesium hydroxide, Saline Mouthwash, alteplase, LORazepam    ROS:  As noted above, otherwise remainder of 10-point ROS negative    Physical Exam:     I&O:      Intake/Output Summary (Last 24 hours) at 01/17/2018 1026  Last data filed at 01/17/2018 0833  Gross per 24 hour   Intake 3841 ml    Output 6250 ml   Net -2409 ml       Vital Signs:  BP (!) 147/91    Pulse 77    Temp 97.8 ??F (36.6 ??C) (Oral)    Resp 18    Ht 5\' 9"  (1.753 m)    Wt 196 lb 9.6 oz (89.2 kg)    SpO2 95%    BMI 29.03 kg/m??     Weight:    Wt Readings from Last 3 Encounters:   01/17/18 196 lb 9.6 oz (89.2 kg)   01/10/18 194 lb 12.8 oz (88.4 kg)   12/06/17 195 lb (88.5 kg)         General: Awake, alert and oriented.  HEENT: normocephalic, alopecia, PERRL, no scleral erythema or icterus, Oral mucosa moist and intact, thrush in posterior pharynx   LYMPH:  Left axillary lymphadenopathy   NECK: supple without palpable adenopathy  BACK: Straight negative CVAT  SKIN: warm dry and intact without lesions rashes or masses  CHEST: Clear without wheez, but poor air movement without use of accessory  muscles  CV: Normal S1 S2, RRR, no MRG  ABD: NT ND normoactive BS, no palpable masses or hepatosplenomegaly  EXTREMITIES: without edema, denies calf tenderness  NEURO: CN II - XII grossly intact  CATHETER: Right IJ chest Port (06/21/17, [facility]) - CDI    Data    CBC:   Recent Labs     01/15/18  0400 01/16/18  0450 01/17/18  0405   WBC 8.0 7.4 7.7   HGB 9.3* 9.3* 9.3*   HCT 27.7* 28.1* 27.6*   MCV 95.1 94.8 93.9   PLT 93* 103* 91*     BMP/Mag:  Recent Labs     01/15/18  0400 01/16/18  0450 01/17/18  0405   NA 136 139 134*   K 4.2 4.2 4.0   CL 100 101 95*   CO2 24 24 27    PHOS  --   --  3.3   BUN 30* 33* 33*   CREATININE 0.7* 0.7* 0.7*   MG  --   --  2.50*     LIVP:   Recent Labs     01/17/18  0405   AST 28   ALT 58*   BILIDIR <0.2   BILITOT 0.3   ALKPHOS 54     Coags:   Recent Labs     01/17/18  0405   PROTIME 12.3   INR 1.08   APTT 23.8*     Uric Acid   Recent Labs     01/17/18  0405   LABURIC 4.5       Diagnostics:  1.  PET scan (01/10/18):    ??  Myeloma Labs (01/03/18):  SPEP:  reveals that M2, previously characterized as monoclonal IgG kappa in  mid-to-slow gamma is 1.0 gm/dL, greater than the 0.2 gm/dL observed on 08 Nov 1608.   SIFE:  Serum  immunofixation electrophoresis reveals persistent M2 in mid-to-slow gamma, a monoclonal IgG kappa.  A recent M-spike, M1, was minor monoclonal IgG lambda in mid-gamma; M1 continues to be absent.  SFLC:  Kappa:  30.80, previously (11/08/17) - 10.40  Lambda:  1.46, previously (11/08/17) - 5.97  Free Kappa Lambda Ratio:  21.10, previously (11/08/17) - 1.74  Quantitative Immunoglobulins:  IgG:   1480, previously (11/08/17) - 552  IgA:   <26, previously (11/08/17) - 30  IgM:   <20  ??  PROBLEM LIST: ??????????   ????  1.  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  Peripheral neuropathy  3.  Anxiety/Depression  4.  Hyperlipidemia  5.  Hypertension  6.  Insomnia  7.  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  COPD  9.  Influenza A (10/12/17)  ????  TREATMENT: ??????????   ??  1. Rad Tx to T5-7, T10-L3, Right Scapula - 3000 cGy - Dr. Bonita Quin 03/20/16-04/01/16  2. RVD x1 03/20/16 - discontinued d/t rash   3. Velcade/Pomalyst/Dex x3 cycles 04/23/16-06/17/16 - discontinued d/t reaction to pomalyst  4. Velcade/Dex x 1 cycle 06/26/16 (last dose of dexamethasone 07/22/16  5. High-dose melphalan followed by administration of PBSCs 2.36 x10^6 cd34cells/kg on 08/28/16  6. Maintenance Revlimid 10mg  daily (12/2016-04/23/17)  7. Dexamethasone 40mg  daily (04/24/17)  8. DCEP   Cycle #1 - 04/26/17  Cycle #2 - 05/25/17 - excellent response  9.??Dara/Velcade/Dex (C1D1 - 06/22/17) - BMBx ??10/18/17 - CR  Cycles 7-8 (21 day cycle)  - Dex 20 mg po D4,5.8,9,11 and 12  -  Dara 16 mg/Kg D1 only with Dex 20 mg IV  - Velcade 1.3 mg/M2 D1,4,8 and 11  Cycle 9 and beyond (maintenance, 11/08/17 - 01/03/18)  Dara 16 mg/Kg Q 28 days  Dex 12 mg IV D1  Velcade Q 2 weeks??  10. DCEP Cycle #1 - 01/13/18  ??  ASSESSMENT AND PLAN:??????????   ??  1. ??IgG kappa multiple myeloma / Plasma Cell Leukemia:??Relapsed disease   - S/p treatment Dara/Velcade/Dex x 8 cycles (06/22/17 - 10/2017). Followed by maintenance Dara Q4 wks/Velcade Q2ks/Dex  (started 11/08/17)  - Now w/ progressive disease based on myeloma labs (01/03/18) & PET scan (01/10/18), see above   ??  PLAN: DCEP for disease and pain control. Hope for allogeneic transplant in CR2, but needs to disease response and improvement in lung function   - Cont DCEP    Cycle #1, Day + 5  ??  2. ??ID: Afebrile  - Cont Valtrex prophylaxis   - Cont Diflucan 200 mg daily for thrush (started 01/13/18)  ??  3. Heme:??Anemia d/t chemotherapy  - Transfuse for Hgb < 7 and Platelets < 10K  - No transfusion today  ??  4.??Metabolic: Stable renal fxn and e-lytes except for hypoNa & steroid-induced hyperglycemia   - Hyperglycemia:  Cont medium regimen Lispro SSI, AC&HS  - Cont IVF:  NS @ 100 mL/hr & Chemo @ 42 mL/hr  - Replace Mg and K+ per protocol  - Cont Lasix 40 mg IV Q 12 H (started 01/16/18)   ??  5. Pulmonary:??No acute issues, but currently on steroid taper for COPD per Dr. Karie Kirks   - Pulm Nodule: Stable on CT chest  From 04/23/17 & 10/18/17 & 11/26/17; cont to monitor.   - CTPA??(11/26/17): No PE;??resolution of??mild upper lobe tree-in-bud opacities; Mild emphysema. Two 3 mm LLL pulmonary nodules, stable  - PFT (12/27/17):  compared to 07/2016: FVC 3.25 L, 66%, and FEV1  1.42 L, 38%. FEV1/FVC is 44%. This is unchanged from prior study. Air trapping seen on lung volumes. Diffusion capacity 3.91, 79% of predicted, down from 5.21   - Follows up with Dr. Bloomer??for COPD  - Pulm following, appreciate recs  - Cont Dex taper for COPD:  8 mg bid x 2 days, 4 mg bid x 2 days, then stop (last day 01/20/18)  - Cont Albuterol inhaler prn & home nebulizer (Rx 10/12/17)  - Cont scheduled Duonebs  ??  6. GI/Nutrition: Appetite and oral intake is good   - Cont PPI ppx w/ steroids   - Cont low microbial diet ??  - Dietary to follow   Constipation:  Improved  - S/p Dulcolax (01/16/18)   - Start Senokot-s 2 tabs daily (01/17/18)  ??  7. Cardiac:??Mild HTN, possibly from hypervolemia d/t IVF  - H/o HLD &??has septal wall defect on echocardiogram.  - Echo  (07/13/16): abnormal (paradoxical) septal motion is present with preserved LVEF 55-60%.  - Cont Lipitor   - Cont Lasix 40 mg IV bid (started 01/16/18)  ??  8. Anxiety/Depression: Ongoing  - Cont Zoloft 50 mg daily   - Cont Ativan PRN  - Psych to follow as needed   ??  9. Insomnia:??ongoing  - He has failed multiple sleep aids in the past. ??  - No complaints currently   ??  10. Peripheral Neuropathy:??Ongoing  - Cont Gabapentin 300 mg TID  ??  11. Bone Health: No acute fx   - H/o spinal cord compression & diffuse lytic lesions t/o skeleton  -  CT Chest (04/23/17) - multiple lytic lesions throughout the skeleton   - Cont Ca/Vit D &??Zometa monthly (given 01/03/18, next due 01/31/18)  ??  12.??Acute on Chronic left lower back pain:  Improving w/ chemotherapy; d/t Neoplasm and relapsed/progressive disease   - S/p Radiation (09/01/17 - 09/15/17, [facility] [place])  - Cont Fentanyl patch 50 mcg/hr (increased 01/13/18)  - Cont Oxycodone 10 mg q2hrs prn (increased 01/13/18)      - DVT Prophylaxis: Platelets >50,000 cells/dL, - daily lovenox prophylaxis ordered  Contraindications to pharmacologic prophylaxis: None  Contraindications to mechanical prophylaxis: None    - Disposition: Once pain controlled and chemo completes w/out toxicity.  D/c home on 01/18/18 & will need Neulasta 01/19/2018 (not OBI)        Emilee Hero, MD   Emilee Hero, MD  [facility]  Please contact me through [employer]

## 2018-01-17 NOTE — Other (Signed)
01/17/18 1136   Encounter Summary   Services provided to: Patient   Volunteer Visit   (5/17 phil[facility]sandy schneider)   Sacraments   Communion Patient received communion

## 2018-01-17 NOTE — Progress Notes (Signed)
Pulmonary & Critical Care Medicine    Admit Date: 01/12/2018  PCP: Harlene Salts, MD    CC:  COPD  Events of Last 24 hours:   Overall COPD is improving.  He can speak full sentences and can walk in the hallways.  He is still wheezing but he states his breathing is much better especially after breathing treatments.    There is no fever or chills.    No sputum production.     Vitals:  Tmax:  VITALS:  BP (!) 136/95    Pulse 88    Temp 97.6 ??F (36.4 ??C) (Oral)    Resp 20    Ht 5' 9"  (1.753 m)    Wt 196 lb 9.6 oz (89.2 kg)    SpO2 91%    BMI 29.03 kg/m??   24HR INTAKE/OUTPUT:      Intake/Output Summary (Last 24 hours) at 01/17/2018 1349  Last data filed at 01/17/2018 1142  Gross per 24 hour   Intake 3601 ml   Output 8190 ml   Net -4589 ml     CURRENT PULSE OXIMETRY:  SpO2: 91 %  24HR PULSE OXIMETRY RANGE:  SpO2  Avg: 93.3 %  Min: 91 %  Max: 95 %    EXAM:  General: No distress. Alert.  Eyes: PERRL. No sclera icterus. No conjunctival injection.  ENT: No discharge. Moist oral mucosa   Neck: Trachea midline. Neck is supple   Resp: No accessory muscle use. Bilateral expiratory wheezing   CV: Regular rate. Regular rhythm. No mumur or rub. No edema.   GI: Non-tender. Non-distended.  Normal bowel sounds.   Skin: Warm and dry. No nodule on exposed extremities. No rash on exposed extremities.  M/S: No cyanosis. No joint deformity. No clubbing.   Neuro: Awake. Speech is clear.    Psych:  No anxiety or agitation.     Medications:    IV:  ??? dextrose     ??? sodium chloride 1,000 mL (01/17/18 1256)   ??? sodium chloride           Scheduled Meds:  ??? sennosides-docusate sodium  2 tablet Oral Daily   ??? furosemide  40 mg Intravenous BID   ??? insulin lispro  0-12 Units Subcutaneous TID WC   ??? insulin lispro  0-6 Units Subcutaneous Nightly   ??? dexamethasone  8 mg Oral 2 times per day   ??? [START ON 01/19/2018] dexamethasone  4 mg Oral 2 times per day   ??? albuterol  2.5 mg Nebulization Q4H WA   ??? enoxaparin  40 mg Subcutaneous QPM   ??? fentaNYL  1  patch Transdermal Q72H   ??? ondansetron  24 mg Oral Q24H   ??? cyclophosphamide/etoposide/CISplatin chemo infusion   Intravenous Q24H   ??? fluconazole  200 mg Oral Daily   ??? gabapentin  300 mg Oral TID   ??? pantoprazole  40 mg Oral QAM AC   ??? sertraline  50 mg Oral Daily   ??? valACYclovir  500 mg Oral BID   ??? sodium chloride flush  10 mL Intravenous 2 times per day   ??? Saline Mouthwash  15 mL Swish & Spit 4x Daily AC & HS   ??? budesonide  0.5 mg Nebulization BID   ??? formoterol  20 mcg Nebulization BID   ??? tiotropium  18 mcg Inhalation Daily   ??? calcium elemental  500 mg Oral BID         Diet: DIET GENERAL;  Results:  CBC:   Recent Labs     01/15/18  0400 01/16/18  0450 01/17/18  0405   WBC 8.0 7.4 7.7   HGB 9.3* 9.3* 9.3*   HCT 27.7* 28.1* 27.6*   MCV 95.1 94.8 93.9   PLT 93* 103* 91*     BMP:   Recent Labs     01/15/18  0400 01/16/18  0450 01/17/18  0405   NA 136 139 134*   K 4.2 4.2 4.0   CL 100 101 95*   CO2 24 24 27    PHOS  --   --  3.3   BUN 30* 33* 33*   CREATININE 0.7* 0.7* 0.7*     LIVER PROFILE:   Recent Labs     01/17/18  0405   AST 28   ALT 58*   BILIDIR <0.2   BILITOT 0.3   ALKPHOS 54     PT/INR:   Recent Labs     01/17/18  0405   PROTIME 12.3   INR 1.08     APTT:   Recent Labs     01/17/18  0405   APTT 23.8*     UA:  Recent Labs     01/17/18  0020   COLORU Yellow   PHUR 6.0   WBCUA 0-2   RBCUA 0-2   CLARITYU Clear   SPECGRAV 1.020   LEUKOCYTESUR Negative   UROBILINOGEN 0.2   BILIRUBINUR Negative   BLOODU Negative   GLUCOSEU 250*         Assessment/Plan:  62 y.o. male with     COPD exacerbation is improving.  Currently close to baseline.  Currently on room air.    Multiple myeloma - awaiting BMT    Continue pulmicort, formoterol, and spiriva nebs while in the hospital   Switch back to Trelegy on discharge.    I will sign off.  Please call with any questions or concerns    David Klemens [place] Faster, MD

## 2018-01-17 NOTE — Progress Notes (Signed)
VSS. Ortho negative. Assessment completed. Scheduled meds given along with PRN oxy. Pt resting comfortably in chair. Call light within reach. Family at bedside. Denies further needs at this time. Will continue to monitor. Electronically signed by Milinda Pointer, RN on 01/17/2018 at 9:37 PM

## 2018-01-17 NOTE — Care Coordination-Inpatient (Addendum)
Type of Admission  Relapse MM & increase pain  C1 Day #5  DCEP ( 01/13/18)        Central venous catheter  Right Single Lumen Port (IR, 06/21/17)      Plan    Pain control & treatment of relapse disease with DCEP      Update  01/14/18:  Admitted for pain control & start of DCEP.  States pain control is somewhat better today.  Steroids causing him insomnia and elevated blood sugars.  Psychologist following for anxiety.  01/17/18:  States pain level down to a "8", which he states as acceptable.  Will be discharged tomorrow, mr. Kneale is aware.          Education  01/14/18:  Discussed use of Neulasta OBI at dischrge, prefers to receive injection over OBI, discussed with Dr. Hunt Oris & [facility] notified  20    Discharge    At Completion of Chemotherapy--->Tuesday, 5/21  See AVS for Appointment Follow up    Pending

## 2018-01-17 NOTE — Plan of Care (Signed)
Problem: Falls - Risk of:  Goal: Will remain free from falls  Description  Will remain free from falls  01/17/2018 1118 by Lilla Shook, RN  Outcome: Ongoing    Pt with activity orders for up ad lib.  Encouraged pt to be up OOB as much as possible throughout the day and for all meals.  Encouraged frequent short naps as necessary to preserve energy but instructed that while awake, pt should be OOB.    Encouraged pt to ambulate in halls.  Pt seen up ad lib in halls once this shift. Pt is visualized to be OOB 76-100% of the time this shift.  Will continue to encourage frequent activity.    Problem: Pain:  Goal: Pain level will decrease  Description  Pain level will decrease  01/17/2018 1118 by Lilla Shook, RN  Outcome: Ongoing     Pt continues with back pain. Medicated with oxycodone. Encourage pt to call out with any needs or status changes. Will monitor.     Problem: Bleeding:  Goal: Will show no signs and symptoms of excessive bleeding  Description  Will show no signs and symptoms of excessive bleeding  01/17/2018 1118 by Lilla Shook, RN  Outcome: Ongoing   Patient's hemoglobin this AM:   Recent Labs     01/17/18  0405   HGB 9.3*     Patient's platelet count this AM:   Recent Labs     01/17/18  0405   PLT 91*    Thrombocytopenia Precautions in place.  Patient showing no signs or symptoms of active bleeding.  Transfusion not indicated at this time.  Patient verbalizes understanding of all instructions. Will continue to assess and implement POC. Call light within reach and hourly rounding in place.      Problem: Discharge Planning:  Goal: Discharged to appropriate level of care  Description  Discharged to appropriate level of care  Outcome: Ongoing   Pt verbalize understanding of treatment plan. Will monitor.     Problem: Infection - Central Venous Catheter-Associated Bloodstream Infection:  Goal: Will show no infection signs and symptoms  Description  Will show no infection signs and symptoms  01/17/2018 1118  by Lilla Shook, RN  Outcome: Ongoing   CVC site remains free of signs/symptoms of infection. No drainage, edema, erythema, pain, or warmth noted at site. Dressing changes continue per protocol and on an as needed basis - see flowsheet.     Problem: Breathing Pattern - Ineffective:  Goal: Ability to achieve and maintain a regular respiratory rate will improve  Description  Ability to achieve and maintain a regular respiratory rate will improve    Outcome: Met This Shift  Note:   No complaints of breathing difficulties. Pt remains on room air.  Pt has respiratory scheduled treatments. Will continue to monitor.     Problem: Serum Glucose Level - Abnormal:  Goal: Ability to maintain appropriate glucose levels has stabilized  Description  Ability to maintain appropriate glucose levels has stabilized  01/17/2018 1118 by Lilla Shook, RN  Outcome: Ongoing   Pt continues on blood sugar checks with insulin coverage.     Problem: Bowel/Gastric:  Goal: Ability to achieve a regular elimination pattern will improve  Description  Ability to achieve a regular elimination pattern will improve  01/17/2018 1118 by Lilla Shook, RN  Outcome: Ongoing   Pt verbalize had BM yesturday. Pt started on senokot today. Will monitor.

## 2018-01-17 NOTE — Care Coordination-Inpatient (Signed)
SW talked with patient and SO today as patient will likely d/c to home tomorrow.  SO thought patient could benefit from home therapy which patient was agreeable to.  He had home PT/OT with [facility] last year and is interested in this at discharge.  SW will get an order for this for discharge.  No other needs at this time.    Freddi Starr, MSW, [place]

## 2018-01-18 LAB — BASIC METABOLIC PANEL
Anion Gap: 12 (ref 3–16)
BUN: 33 mg/dL — ABNORMAL HIGH (ref 7–20)
CO2: 27 mmol/L (ref 21–32)
Calcium: 8.1 mg/dL — ABNORMAL LOW (ref 8.3–10.6)
Chloride: 93 mmol/L — ABNORMAL LOW (ref 99–110)
Creatinine: 0.8 mg/dL — ABNORMAL LOW (ref 0.9–1.3)
GFR African American: >60 (ref >60)
GFR Non-African American: >60 (ref >60)
Glucose: 209 mg/dL — ABNORMAL HIGH (ref 70–99)
Potassium: 4.3 mmol/L (ref 3.5–5.1)
Sodium: 132 mmol/L — ABNORMAL LOW (ref 136–145)

## 2018-01-18 LAB — CBC WITH AUTO DIFFERENTIAL
Basophils %: 0.2 %
Basophils Absolute: 0 10*3/uL (ref 0.0–0.2)
Eosinophils %: 0 %
Eosinophils Absolute: 0 10*3/uL (ref 0.0–0.6)
Hematocrit: 28 % — ABNORMAL LOW (ref 40.5–52.5)
Hemoglobin: 9.4 g/dL — ABNORMAL LOW (ref 13.5–17.5)
Lymphocytes %: 1 %
Lymphocytes Absolute: 0.1 10*3/uL — ABNORMAL LOW (ref 1.0–5.1)
MCH: 31.7 pg (ref 26.0–34.0)
MCHC: 33.6 g/dL (ref 31.0–36.0)
MCV: 94.5 fL (ref 80.0–100.0)
MPV: 7.2 fL (ref 5.0–10.5)
Monocytes %: 1.7 %
Monocytes Absolute: 0.1 10*3/uL (ref 0.0–1.3)
Neutrophils %: 97.1 %
Neutrophils Absolute: 7.3 10*3/uL (ref 1.7–7.7)
Platelets: 83 10*3/uL — ABNORMAL LOW (ref 135–450)
RBC: 2.97 M/uL — ABNORMAL LOW (ref 4.20–5.90)
RDW: 18.9 % — ABNORMAL HIGH (ref 12.4–15.4)
WBC: 7.5 10*3/uL (ref 4.0–11.0)

## 2018-01-18 LAB — POCT GLUCOSE
POC Glucose: 181 mg/dl — ABNORMAL HIGH (ref 70–99)
POC Glucose: 164 mg/dl — ABNORMAL HIGH (ref 70–99)

## 2018-01-18 MED ORDER — FENTANYL 50 MCG/HR TD PT72
50 MCG/HR | MEDICATED_PATCH | TRANSDERMAL | 0 refills | Status: AC
Start: 2018-01-18 — End: 2018-02-18

## 2018-01-18 MED ORDER — SENNA-DOCUSATE SODIUM 8.6-50 MG PO TABS
Freq: Every day | ORAL | Status: AC
Start: 2018-01-18 — End: ?

## 2018-01-18 MED ORDER — BISACODYL 5 MG PO TBEC
5 MG | Freq: Every day | ORAL | Status: AC | PRN
Start: 2018-01-18 — End: ?

## 2018-01-18 MED ORDER — DEXAMETHASONE 4 MG PO TABS
4 MG | ORAL_TABLET | ORAL | 0 refills | Status: DC
Start: 2018-01-18 — End: 2018-02-08

## 2018-01-18 MED ORDER — HEPARIN SOD (PORK) LOCK FLUSH 100 UNIT/ML IV SOLN
100 UNIT/ML | Freq: Once | INTRAVENOUS | Status: AC
Start: 2018-01-18 — End: 2018-01-18
  Administered 2018-01-18: 14:00:00 500 [IU]

## 2018-01-18 MED FILL — ALBUTEROL SULFATE (2.5 MG/3ML) 0.083% IN NEBU: RESPIRATORY_TRACT | Qty: 3

## 2018-01-18 MED FILL — PROCHLORPERAZINE MALEATE 10 MG PO TABS: 10 mg | ORAL | Qty: 1

## 2018-01-18 MED FILL — SODIUM CHLORIDE 0.9 % IV SOLN: 0.9 % | INTRAVENOUS | Qty: 1000

## 2018-01-18 MED FILL — VALACYCLOVIR HCL 500 MG PO TABS: 500 mg | ORAL | Qty: 1

## 2018-01-18 MED FILL — OXYCODONE HCL 5 MG PO TABS: 5 mg | ORAL | Qty: 2

## 2018-01-18 MED FILL — GABAPENTIN 300 MG PO CAPS: 300 mg | ORAL | Qty: 1

## 2018-01-18 MED FILL — SENNOSIDES-DOCUSATE SODIUM 8.6-50 MG PO TABS: 8.6-50 mg | ORAL | Qty: 2

## 2018-01-18 MED FILL — BUDESONIDE 0.5 MG/2ML IN SUSP: 0.5 MG/2ML | RESPIRATORY_TRACT | Qty: 2

## 2018-01-18 MED FILL — PERFOROMIST 20 MCG/2ML IN NEBU: 20 MCG/2ML | RESPIRATORY_TRACT | Qty: 2

## 2018-01-18 MED FILL — DEXAMETHASONE 4 MG PO TABS: 4 mg | ORAL | Qty: 2

## 2018-01-18 MED FILL — HEPARIN LOCK FLUSH 100 UNIT/ML IV SOLN: 100 [IU]/mL | INTRAVENOUS | Qty: 5

## 2018-01-18 MED FILL — SERTRALINE HCL 50 MG PO TABS: 50 mg | ORAL | Qty: 1

## 2018-01-18 MED FILL — PANTOPRAZOLE SODIUM 40 MG PO TBEC: 40 mg | ORAL | Qty: 1

## 2018-01-18 MED FILL — OYSTER SHELL CALCIUM 500 MG PO TABS: 500 mg | ORAL | Qty: 1

## 2018-01-18 MED FILL — FUROSEMIDE 10 MG/ML IJ SOLN: 10 mg/mL | INTRAMUSCULAR | Qty: 4

## 2018-01-18 MED FILL — FLUCONAZOLE 200 MG PO TABS: 200 mg | ORAL | Qty: 1

## 2018-01-18 NOTE — Unmapped (Signed)
Your patient was seen at a [facility]. Please go to [email or web address] to view information filed to your patient's chart in Epic.  If you need to view your patient's results prior to gaining access to   Epic CareLink, please contact the [facility] where your patient was seen.              Discharge Summary Notes      Discharge Summary by Awilda Metro, APRN - CNP at 01/18/2018  7:28 AM  Version 1 of 1    Author:  Awilda Metro, APRN - CNP Service:  Hematology Oncology Author Type:  Nurse Practitioner    Filed:  01/18/2018  9:46 AM Date of Service:  01/18/2018  7:28 AM Status:  Signed    Editor:  Awilda Metro, APRN - CNP (Nurse Practitioner) Cosigner:  Emilee Hero, MD at 01/19/2018  1:55 PM          [facility] Discharge Summary             Attending Physician: Emilee Hero, MD    Referring MD: No referring provider defined for this encounter.    Name: Derek Benson DOB:  May 17, 1956  MRN:  [id number]    Admission: 01/12/2018   Discharge:   01/18/18    Date: 01/18/2018    Diagnosis on admit:     Procedures: Routine chest x-ray, laboratories, EKG, IV fluid hydration, Respiratory therapy, Chemotherapy     Consultations:   1.  Pulmonary:  Dr. Carmie Kanner    Medications:[ME.1]    Jahaziel, Francois   Home Medication Instructions [phone]    Printed on:01/18/18 [phone]   Medication Information                      albuterol sulfate HFA (PROAIR HFA) 108 (90 Base) MCG/ACT inhaler Inhale 2 puffs into the lungs every 6 hours as needed for Wheezing            atorvastatin (LIPITOR) 10 MG tablet Take 10 mg by mouth nightly             bisacodyl (DULCOLAX) 5 MG EC tablet Take 2 tablets by mouth daily as needed for Constipation            calcium carbonate (OSCAL) 500 MG TABS tablet Take 500 mg by mouth 2 times daily             dexamethasone (DECADRON) 4 MG tablet 8 mg x 1 dose on Tuesday evening, then 4 mg twice daily x 2 days            fentaNYL (DURAGESIC) 50 MCG/HR Place 1  patch onto the skin every 72 hours for 30 days.            Fluticasone-Umeclidin-Vilant (TRELEGY ELLIPTA) 100-62.5-25 MCG/INH AEPB Inhale 1 puff into the lungs daily            gabapentin (NEURONTIN) 300 MG capsule Take 1 capsule by mouth 3 times daily for 30 days.Marland Kitchen            lidocaine-prilocaine (EMLA) 2.5-2.5 % cream Apply topically as needed for Pain Apply topically as needed.            ondansetron (ZOFRAN) 4 MG tablet Take 1 tablet by mouth every 6 hours as needed for Nausea or Vomiting            oxyCODONE-acetaminophen (PERCOCET) 7.5-325 MG per tablet Take 1 tablet by mouth  every 4 hours as needed for Pain..            pantoprazole (PROTONIX) 40 MG tablet Take 1 tablet by mouth every morning (before breakfast)            prochlorperazine (COMPAZINE) 10 MG tablet Take 1 tablet by mouth every 6 hours as needed (Nausea)            sennosides-docusate sodium (SENOKOT-S) 8.6-50 MG tablet Take 2 tablets by mouth daily            sertraline (ZOLOFT) 50 MG tablet Take 50 mg by mouth daily            valACYclovir (VALTREX) 500 MG tablet Take 500 mg by mouth 2 times daily[ME.2]                Reason for Admission: Uncontrolled Pain d/t neoplasm & Relapsed Disease      Past Medical History:  Benedetto Ryder is a 62 yo male w/ 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  His PMH is also significant for  HTN, HLD, anxiety/depression, COPD & peripheral neuropathy. He is most recently been treated with bimonthly velcade &  monthly   daratumumab w/ Dexamethasone for his relapsed MM (last tx 01/03/18). He has been following closely w/ Dr. Karie Kirks for COPD and recently been on a steroid taper for COPD exacerbation.   ????  He recently underwent repeat myeloma labs (01/03/18) that show progressive disease and a PET scan (01/10/18) that showed marked progression w/ multiple new osseous lesions.  There is a paraspinal soft tissue mass along the lower thoracic & upper lumbar   spine.  There are new enlarged lymph nodes in  the left axillary & subpectoral region.  He has been complaining of increased back pain and on 01/05/18 his Fentanyl patch was increased from 12.5 mcg/hr to 25 mcg/hr and Pecocet dose was increased.  He was   then called [facility] (01/12/18) w/ the PET scan results.  He was assessed and advised to report to the ED because of increased pain, rating 9-10.  His pain is on the left side from the ribcage to waist / spine area.  He denies bowel and bladder dysfunction and   his leg strength is not strong because I have not been walking around much.  He was given Percocet and IV Morphine for management and his pain was slightly improved by the following morning.      Hospital Course:    He then presented (01/12/18) w/ c/o increased shortness of breath, but less cough, increased fatigue, decreased appetite and weight loss.  He denied chest pain, headaches, visual changes or mouth pain. He denied increased numbness or lack of feeling in   his lower extremities and states his bowel and bladder function is normal. He denied foot drop. He was anxious, scared and very fatigued w/ significant low back pain.  He states  I am not ready to die or give up.  He will be admitted for pain   management and chemotherapy.[ME.1] On admission, he Fentanyl patch was increased to 50 mcg and Oxycodone was increased to 10 mg every 2 hours.  He then started DCEP chemotherapy (01/13/18).  By the following morning his pain was slightly better.    Pulmonary was consulted for management of his COPD exacerbation, which occurred prior to him being admitted.  His breathing and shortness of breath has continued to improve.  He received Decadron 40 mg daily x  4 days w/ chemotherapy and then resumed his   steroid taper for COPD. He will take 8 mg x 2 days followed by 4 mg x 2 days (last dose 01/20/18).      He continues to have low back pain, but it has improved. He is ambulating w/out assistance and able to perform his ADLs.  He completed his chemotherapy w/out  incidence and will be discharged home today.  He will follow up in [facility] on Wednesday (01/20/18)   for provider visit, Neulasta injection and labs (CBC w/ diff, CMP, Mag and Phos).[ME.2]        Physical Exam:     Vital Signs:  BP (!) 131/95    Pulse 76    Temp 97.6 ????F (36.4 ????C) (Oral)    Resp 18    Ht 5' 9 (1.753 m)    Wt 196 lb 9.6 oz (89.2 kg)    SpO2 95%    BMI 29.03 kg/m????     Weight:    Wt Readings from Last 3 Encounters:   01/17/18 196 lb 9.6 oz (89.2 kg)   01/10/18 194 lb 12.8 oz (88.4 kg)   12/06/17 195 lb (88.5 kg)       General: Awake, alert and oriented.  HEENT:????normocephalic, alopecia,????PERRL, no scleral erythema or icterus, Oral mucosa moist and intact  LYMPH: ????Left axillary lymphadenopathy????  NECK: supple without palpable adenopathy  BACK: Straight negative CVAT  SKIN:????warm dry and intact without lesions rashes or masses  CHEST:????Clear w[ME.1]/ exp[ME.2] wheez[ME.1]e[ME.2],????without use of accessory muscles  CV: Normal S1 S2, RRR, no MRG  ABD:????NT ND normoactive BS, no palpable masses or hepatosplenomegaly  EXTREMITIES:????without edema, denies calf tenderness  NEURO: CN II - XII grossly intact  CATHETER:????Right IJ[ME.1] PAC[ME.2] (06/21/17, [place])????- CDI      Discharge Laboratory Data:  CBC:   Recent Labs     01/16/18 0450 01/17/18 0405 01/18/18 0340   WBC 7.4 7.7 7.5   HGB 9.3* 9.3* 9.4*   HCT 28.1* 27.6* 28.0*   MCV 94.8 93.9 94.5   PLT 103* 91* 83*     BMP/Mag:  Recent Labs     01/16/18 0450 01/17/18 0405 01/18/18 0340   NA 139 134* 132*   K 4.2 4.0 4.3   CL 101 95* 93*   CO2 24 27 27    PHOS  --  3.3  --    BUN 33* 33* 33*   CREATININE 0.7* 0.7* 0.8*   MG  --  2.50*  --      LIVP:   Recent Labs     01/17/18 0405   AST 28   ALT 58*   BILIDIR <0.2   BILITOT 0.3   ALKPHOS 54     Coags:   Recent Labs     01/17/18 0405   PROTIME 12.3   INR 1.08   APTT 23.8*     Uric Acid   Recent Labs     01/17/18 0405   LABURIC 4.5     Diagnostics:  1. ????PET scan (01/10/18):    ????  Myeloma Labs  (01/03/18):  SPEP:????????reveals that M2, previously characterized as monoclonal IgG kappa in  mid-to-slow gamma is 1.0 gm/dL, greater than the 0.2 gm/dL observed on 08 Nov 1608.????  SIFE:????????Serum immunofixation electrophoresis reveals persistent M2 in mid-to-slow gamma, a monoclonal IgG kappa. ????A recent M-spike, M1, was minor monoclonal IgG lambda in mid-gamma; M1 continues to be absent.  SFLC:  Kappa:  30.80, previously (11/08/17) -????10.40  Lambda:  1.46, previously (11/08/17) -????5.97  Free Kappa Lambda Ratio:  21.10, previously (11/08/17) -????1.74  Quantitative Immunoglobulins:  IgG:????  1480, previously (11/08/17) - 552  IgA:????  <26, previously (11/08/17) -????30  IgM:????  <20  ????  PROBLEM LIST: ????????????????????   ????????  1.????????IgG Kappa Multiple????Myeloma????/????Plasma Cell Leukemia (Dx????03/2017)  2.????????Peripheral neuropathy  3. ????Anxiety/Depression  4.????????Hyperlipidemia  5.????????Hypertension  6.????????Insomnia  7.????????Chronic low????back pain????d/t neoplasm (h/o lytic lesions????&????cord compression @????T6 & T11)  8. ????COPD  9.????????Influenza A????(10/12/17)  ????????  TREATMENT: ????????????????????   ????  1. Rad Tx to T5-7, T10-L3, Right Scapula - 3000 cGy - Dr. Bonita Quin 03/20/16-04/01/16  2. RVD x1 03/20/16 - discontinued d/t rash   3. Velcade/Pomalyst/Dex x3 cycles 04/23/16-06/17/16 - discontinued d/t reaction to pomalyst  4. Velcade/Dex x 1 cycle 06/26/16 (last dose of dexamethasone 07/22/16  5. High-dose melphalan followed by administration of PBSCs 2.36 x10^6 cd34cells/kg on 08/28/16  6. Maintenance Revlimid 10mg  daily (12/2016-04/23/17)  7. Dexamethasone 40mg  daily (04/24/17)  8. DCEP   Cycle #1 - 04/26/17  Cycle #2 - 05/25/17 - excellent response  9.????Dara/Velcade/Dex (C1D1 - 06/22/17) - BMBx ????10/18/17 - CR  Cycles 7-8 (21 day cycle)  - Dex 20 mg po D4,5.8,9,11 and 12  - Dara 16 mg/Kg D1 only with Dex 20 mg IV  - Velcade 1.3 mg/M2 D1,4,8 and 11  Cycle 9 and beyond (maintenance, 11/08/17????- 01/03/18)  Dara 16 mg/Kg Q 28 days  Dex 12 mg IV D1  Velcade Q 2 weeks????  10. DCEP    Cycle #1 - 01/13/18  ????  ASSESSMENT AND PLAN:????????????????????   ????  1. ????IgG kappa multiple myeloma / Plasma Cell Leukemia:????Relapsed disease????  - S/p????treatment????Dara/Velcade/Dex????x 8 cycles????(06/22/17 - 10/2017). Followed by????maintenance Dara????Q4 wks/Velcade????Q2ks/Dex????(started 11/08/17)  - Now w/ progressive disease based on myeloma labs (01/03/18) & PET scan (01/10/18), see above????  ????  PLAN:????DCEP for disease and pain control. Hope for allogeneic transplant in CR2, but needs to disease response and improvement in lung function????  ????  DCEP Cycle #1, Day + 6  ????  2. ????ID: Afebrile  - Cont Valtrex prophylaxis????  - S/p Diflucan 200 mg daily for thrush (01/13/18 - 01/18/18)  - Start Levaquin and Diflucan when ANC < 1.5  ????  3. Heme:????Anemia d/t chemotherapy  - Transfuse for Hgb < 7 and Platelets <????20K  - No transfusion today  ????  4.????Metabolic: Stable renal fxn and e-lytes????except for hypoNa &????steroid-induced hyperglycemia   ????  5. Pulmonary:????No acute issues, but currently on steroid taper for COPD per Dr. Karie Kirks   - Pulm Nodule: Stable????on????CT chest ????From????04/23/17????& 10/18/17 & 11/26/17; cont to monitor.   - CTPA????(11/26/17): No PE;????resolution of????mild upper lobe tree-in-bud opacities; Mild emphysema. Two 3 mm LLL pulmonary nodules, stable  - PFT (12/27/17):????????compared to????07/2016:????FVC 3.25 L, 66%, and FEV1  1.42 L, 38%. FEV1/FVC is 44%. This is unchanged from prior study. Air trapping seen on lung volumes. Diffusion capacity 3.91, 79% of predicted, down from 5.21????  - Follows up with Dr. Karie Kirks????for COPD  - Pulm following, appreciate recs  - Cont Dex taper for COPD:  8 mg bid x 2 days, 4 mg bid x 2 days, then stop (last day 01/20/18)  - Cont Albuterol inhaler prn & home nebulizer   ????  6. GI/Nutrition: Appetite and oral intake is good????  - Cont PPI ppx w/ steroids????  - Cont low microbial diet ????  Constipation:  Improved  -[ME.1] Cont[ME.2] Dulcolax[ME.1] & Senokot-s prn[ME.2] (01/16/18)   ????  7. Cardiac:????Mild  HTN,  possibly from hypervolemia d/t IVF  - H/o HLD &????has septal wall defect on echocardiogram.  - Echo (07/13/16): abnormal (paradoxical) septal motion is present with preserved LVEF 55-60%.  -[ME.1] Resume[ME.2] Lipitor   ????  8.[ME.1] Psych:  Concerned about disease progression and ability to get transplant[ME.2]   Anxiety/Depression: Ongoing  - Cont Zoloft????50 mg daily????  ????Insomnia:????No complaints????currently????  ????[ME.1]  9[ME.2]. Peripheral Neuropathy:????Ongoing  - Cont Gabapentin 300 mg????TID  ????  1[ME.1]0[ME.2]. 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  1[ME.1]1[ME.2].????Acute on????Chronic left lower back pain:????????Improving w/ chemotherapy; d/t Neoplasm and relapsed/progressive disease????  - S/p Radiation (09/01/17 - 09/15/17,????[facility] [place])  - Cont Fentanyl patch????50 mcg/hr (increased 01/13/18) - Rx 01/18/18  - Resume Percocet 7.5/325 mg q 4hrs prn[ME.1] (No Rx on d/c)[ME.2]      Condition on discharge:[ME.1]  Stable[ME.2]    Discharge Instructions:[ME.1]  Return to [facility] on 01/19/18 for Neulasta injection, labs (CBC w/ diff, CMP, Mag & Phos) and provider visit.[ME.2]    The patient was advised on activity and dietary restrictions.    The patient was advised to follow up in the emergency department or contact the physician with any unresolved nausea/vomiting/diarrhea/pain or temperature greater than 100.5 F or any other unusual symptoms.       This discharge summary and plan was discussed and agreed upon with Dr. Lynnette Caffey.    Awilda Metro, CNP[ME.1]      Attribution Key   ME.1 - Awilda Metro, APRN - CNP on 01/18/2018  7:28 AM ME.2 - Awilda Metro, APRN - CNP on 01/18/2018  9:36 AM

## 2018-01-18 NOTE — Care Coordination-Inpatient (Signed)
[facility]    Patient aware and agreeable to services. Faxed orders to [facility].    Marcea Rojek, LPN  Care Transition Nurse  [facility]  [phone]

## 2018-01-18 NOTE — Progress Notes (Signed)
VSS. Reassessment completed. Blood drawn from line. Flushed with normal saline. Sample sent to lab. PRN oxy and compazine given at this time. Pt resting comfortably in bed. Call light within reach. Denies further needs at this time. Will continue to monitor. Electronically signed by Regis Bill, RN on 01/18/2018 at 3:51 AM

## 2018-01-18 NOTE — Progress Notes (Signed)
Joni Fears, RN reviewed discharge instructions with patient and  family members.  Reviewed discharge medications including dosing, schedule, indication, and adverse reactions.  Reviewed which medications were already taken today and next dosage due for each medication.      Reviewed signs and symptoms that prompt a call to the physician and appropriate phone numbers. Purple ER card given to the patient with explanations of its use.  Reviewed follow up appointments that have been made in [facility] and Outpatient Oncology.  Low microbial diet, activity restrictions, and increased risk of infection were reviewed.     Patient is being discharged with IV access d/t need for ongoing therapy:      Type:  PAC                          Plan:continue- implanted    CVC care and maintenance was reviewed with patient and family members.  Pt verbalizes understanding of line care and maintenance.      Patient verbalized understanding of all instructions and questions were answered to his. satisfaction.  Signed discharge instructions were given to the patient and a copy placed in the paper-lite chart.  Patient discharged to home per self with family members.      Electronically signed by Vida Rigger, RN on 01/18/2018 at 10:21 AM

## 2018-01-18 NOTE — Care Coordination-Inpatient (Signed)
Type of Admission  Relapse MM & increase pain  C1 Day #6 DCEP ( 01/13/18)        Central venous catheter  Right Single Lumen Port (IR, 06/21/17)      Plan    Pain control & treatment of relapse disease with DCEP      Update  01/14/18:  Admitted for pain control & start of DCEP.  States pain control is somewhat better today.  Steroids causing him insomnia and elevated blood sugars.  Psychologist following for anxiety.  01/17/18:  States pain level down to a "8", which he states as acceptable.  Will be discharged tomorrow, David Watkins is aware.  01/18/18:  Home today & will have follow up @ [facility] tomorrow.          Education  01/14/18:  Discussed use of Neulasta OBI at dischrge, prefers to receive injection over OBI, discussed with Dr. [facility] notified  20  01/18/18:Reviewed discharge instructions with patient and brother.Reviewed discharge medications including dosing, schedule, indication, and adverse reactions.  Reviewed which medications were already taken today and next dosage due for each medication.  David Watkins was given a written prescription for Fentanyl.  Reviewed signs and symptoms that prompt a call to the physician and appropriate phone numbers. Purple ER card given to the patient with explanations of its use.  Reviewed follow up appointments that have been made in [facility] and Outpatient Oncology.  Low microbial diet, activity restrictions, and increased risk of infection were reviewed.     Patient verbalized understanding of all instructions and questions were answered to his. satisfaction.  Signed discharge instructions were given to the patient and a copy placed in the paper-lite chart.  Patient discharged to home per self with brother.      David Watkins    Discharge    At Completion of Chemotherapy--->Tuesday, 5/21  See AVS for Appointment Follow up    Pending

## 2018-01-18 NOTE — Plan of Care (Signed)
Problem: Falls - Risk of:  Goal: Will remain free from falls  Description  Will remain free from falls  Outcome: Ongoing  Note:   Orthostatic vital signs obtained at start of shift - see flowsheet for details.  Pt does not meet criteria for orthostasis.  Pt is a Med fall risk. See Lattie Corns Fall Score and ABCDS Injury Risk assessments. Pt bed is in low position, side rails up, call light and belongings are in reach.  Fall risk light is on outside pts room.  Pt encouraged to call for assistance as needed. Will continue with hourly rounds for PO intake, pain needs, toileting and repositioning as needed.          Problem: Pain:  Goal: Pain level will decrease  Description  Pain level will decrease  Outcome: Ongoing  Note:   Pt educated on importance of calling for pain meds when in pain. Pt verbalized understanding. Pain assessment completed at least once per shift. Will continue to monitor.        Problem: Bleeding:  Goal: Will show no signs and symptoms of excessive bleeding  Description  Will show no signs and symptoms of excessive bleeding  Outcome: Ongoing  Note:   Patient's hemoglobin this AM:   Recent Labs     01/18/18  0340   HGB 9.4*     Patient's platelet count this AM:   Recent Labs     01/18/18  0340   PLT 83*    Thrombocytopenia Precautions in place.  Patient showing no signs or symptoms of active bleeding.  Transfusion not indicated at this time.  Patient verbalizes understanding of all instructions. Will continue to assess and implement POC. Call light within reach and hourly rounding in place.          Problem: Discharge Planning:  Goal: Discharged to appropriate level of care  Description  Discharged to appropriate level of care  Outcome: Ongoing

## 2018-01-18 NOTE — Discharge Summary (Signed)
[facility] Discharge Summary             Attending Physician: Harlene Salts, MD    Referring MD: No referring provider defined for this encounter.    Name: David Watkins DOB:  01/07/1956  MRN:  [id number]    Admission: 01/12/2018   Discharge:   01/18/18    Date: 01/18/2018    Diagnosis on admit:     Procedures: Routine chest x-ray, laboratories, EKG, IV fluid hydration, Respiratory therapy, Chemotherapy     Consultations:   1.  Pulmonary:  Dr. Sherril Cong    Medications:    David Watkins, David Watkins   Home Medication Instructions [phone]    Printed on:01/18/18 [phone]   Medication Information                      albuterol sulfate HFA (PROAIR HFA) 108 (90 Base) MCG/ACT inhaler  Inhale 2 puffs into the lungs every 6 hours as needed for Wheezing             atorvastatin (LIPITOR) 10 MG tablet  Take 10 mg by mouth nightly              bisacodyl (DULCOLAX) 5 MG EC tablet  Take 2 tablets by mouth daily as needed for Constipation             calcium carbonate (OSCAL) 500 MG TABS tablet  Take 500 mg by mouth 2 times daily              dexamethasone (DECADRON) 4 MG tablet  8 mg x 1 dose on Tuesday evening, then 4 mg twice daily x 2 days             fentaNYL (DURAGESIC) 50 MCG/HR  Place 1 patch onto the skin every 72 hours for 30 days.             Fluticasone-Umeclidin-Vilant (TRELEGY ELLIPTA) 100-62.5-25 MCG/INH AEPB  Inhale 1 puff into the lungs daily             gabapentin (NEURONTIN) 300 MG capsule  Take 1 capsule by mouth 3 times daily for 30 days.Marland Kitchen             lidocaine-prilocaine (EMLA) 2.5-2.5 % cream  Apply topically as needed for Pain Apply topically as needed.             ondansetron (ZOFRAN) 4 MG tablet  Take 1 tablet by mouth every 6 hours as needed for Nausea or Vomiting             oxyCODONE-acetaminophen (PERCOCET) 7.5-325 MG per tablet  Take 1 tablet by mouth every 4 hours as needed for Pain..             pantoprazole (PROTONIX) 40 MG tablet  Take 1 tablet by mouth every morning (before breakfast)              prochlorperazine (COMPAZINE) 10 MG tablet  Take 1 tablet by mouth every 6 hours as needed (Nausea)             sennosides-docusate sodium (SENOKOT-S) 8.6-50 MG tablet  Take 2 tablets by mouth daily             sertraline (ZOLOFT) 50 MG tablet  Take 50 mg by mouth daily             valACYclovir (VALTREX) 500 MG tablet  Take 500 mg by mouth 2 times daily  Reason for Admission: Uncontrolled Pain d/t neoplasm & Relapsed Disease      Past Medical History:  David Watkins is a 62 yo male w/ 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  His PMH is also significant for  HTN, HLD, anxiety/depression, COPD & peripheral neuropathy. He is most recently been treated with bimonthly velcade &  monthly daratumumab w/ Dexamethasone for his relapsed MM (last tx 01/03/18). He has been following closely w/ Dr. Dorann Lodge for COPD and recently been on a steroid taper for COPD exacerbation.   ??  He recently underwent repeat myeloma labs (01/03/18) that show progressive disease and a PET scan (01/10/18) that showed marked progression w/ multiple new osseous lesions.  There is a paraspinal soft tissue mass along the lower thoracic & upper lumbar spine.  There are new enlarged lymph nodes in the left axillary & subpectoral region.  He has been complaining of increased back pain and on 01/05/18 his Fentanyl patch was increased from 12.5 mcg/hr to 25 mcg/hr and Pecocet dose was increased.  He was then called [facility] (01/12/18) w/ the PET scan results.  He was assessed and advised to report to the ED because of increased pain, rating 9-10.  His pain is on the left side from the ribcage to waist / spine area.  He denies bowel and bladder dysfunction and his leg strength is "not strong because I have not been walking around much".  He was given Percocet and IV Morphine for management and his pain was slightly improved by the following morning.      Hospital Course:    He then presented (01/12/18) w/ c/o increased shortness of  breath, but less cough, increased fatigue, decreased appetite and weight loss.  He denied chest pain, headaches, visual changes or mouth pain. He denied increased numbness or lack of feeling in his lower extremities and states his bowel and bladder function is normal. He denied foot drop. He was anxious, scared and very fatigued w/ significant low back pain.  He states " I am not ready to die or give up."  He will be admitted for pain management and chemotherapy. On admission, he Fentanyl patch was increased to 50 mcg and Oxycodone was increased to 10 mg every 2 hours.  He then started DCEP chemotherapy (01/13/18).  By the following morning his pain was slightly better.  Pulmonary was consulted for management of his COPD exacerbation, which occurred prior to him being admitted.  His breathing and shortness of breath has continued to improve.  He received Decadron 40 mg daily x 4 days w/ chemotherapy and then resumed his steroid taper for COPD. He will take 8 mg x 2 days followed by 4 mg x 2 days (last dose 01/20/18).      He continues to have low back pain, but it has improved. He is ambulating w/out assistance and able to perform his ADLs.  He completed his chemotherapy w/out incidence and will be discharged home today.  He will follow up in [facility] on Wednesday (01/20/18) for provider visit, Neulasta injection and labs (CBC w/ diff, CMP, Mag and Phos).        Physical Exam:     Vital Signs:  BP (!) 131/95    Pulse 76    Temp 97.6 ??F (36.4 ??C) (Oral)    Resp 18    Ht 5' 9"  (1.753 m)    Wt 196 lb 9.6 oz (89.2 kg)    SpO2 95%  BMI 29.03 kg/m??     Weight:    Wt Readings from Last 3 Encounters:   01/17/18 196 lb 9.6 oz (89.2 kg)   01/10/18 194 lb 12.8 oz (88.4 kg)   12/06/17 195 lb (88.5 kg)       General: Awake, alert and oriented.  HEENT:??normocephalic, alopecia,??PERRL, no scleral erythema or icterus, Oral mucosa moist and intact  LYMPH: ??Left axillary lymphadenopathy??  NECK: supple without palpable adenopathy  BACK:  Straight negative CVAT  SKIN:??warm dry and intact without lesions rashes or masses  CHEST:??Clear w/ exp wheeze,??without use of accessory muscles  CV: Normal S1 S2, RRR, no MRG  ABD:??NT ND normoactive BS, no palpable masses or hepatosplenomegaly  EXTREMITIES:??without edema, denies calf tenderness  NEURO: CN II - XII grossly intact  CATHETER:??Right IJ PAC (06/21/17, [facility])??- CDI      Discharge Laboratory Data:  CBC:   Recent Labs     01/16/18  0450 01/17/18  0405 01/18/18  0340   WBC 7.4 7.7 7.5   HGB 9.3* 9.3* 9.4*   HCT 28.1* 27.6* 28.0*   MCV 94.8 93.9 94.5   PLT 103* 91* 83*     BMP/Mag:  Recent Labs     01/16/18  0450 01/17/18  0405 01/18/18  0340   NA 139 134* 132*   K 4.2 4.0 4.3   CL 101 95* 93*   CO2 24 27 27    PHOS  --  3.3  --    BUN 33* 33* 33*   CREATININE 0.7* 0.7* 0.8*   MG  --  2.50*  --      LIVP:   Recent Labs     01/17/18  0405   AST 28   ALT 58*   BILIDIR <0.2   BILITOT 0.3   ALKPHOS 54     Coags:   Recent Labs     01/17/18  0405   PROTIME 12.3   INR 1.08   APTT 23.8*     Uric Acid   Recent Labs     01/17/18  0405   LABURIC 4.5     Diagnostics:  1. ??PET scan (01/10/18):    ??  Myeloma Labs (01/03/18):  SPEP:????reveals that M2, previously characterized as monoclonal IgG kappa in  mid-to-slow gamma is 1.0 gm/dL, greater than the 0.2 gm/dL observed on 08 Nov 2017.??  SIFE:????Serum immunofixation electrophoresis reveals persistent M2 in mid-to-slow gamma, a monoclonal IgG kappa. ??A recent M-spike, M1, was minor monoclonal IgG lambda in mid-gamma; M1 continues to be absent.  SFLC:  Kappa:  30.80, previously (11/08/17) -??10.40  Lambda:  1.46, previously (11/08/17) -??5.97  Free Kappa Lambda Ratio:  21.10, previously (11/08/17) -??1.74  Quantitative Immunoglobulins:  IgG:??  1480, previously (11/08/17) - 552  IgA:??  <26, previously (11/08/17) -??30  IgM:??  <20  ??  PROBLEM LIST: ??????????   ????  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 ??COPD  9.????Influenza A??(10/12/17)  ????  TREATMENT: ??????????   ??  1. Rad Tx to T5-7, T10-L3, Right Scapula - 3000 cGy - Dr. Jamas Lav 03/20/16-04/01/16  2. RVD x1 03/20/16 - discontinued d/t rash   3. Velcade/Pomalyst/Dex x3 cycles 04/23/16-06/17/16 - discontinued d/t reaction to pomalyst  4. Velcade/Dex x 1 cycle 06/26/16 (last dose of dexamethasone 07/22/16  5. High-dose melphalan followed by administration of PBSCs 2.36 x10^6  cd34cells/kg on 08/28/16  6. Maintenance Revlimid 68m daily (12/2016-04/23/17)  7. Dexamethasone 436mdaily (04/24/17)  8. DCEP   Cycle #1 - 04/26/17  Cycle #2 - 05/25/17 - excellent response  9.??Dara/Velcade/Dex (C1D1 - 06/22/17) - BMBx ??10/18/17 - CR  Cycles 7-8 (21 day cycle)  - Dex 20 mg po D4,5.8,9,11 and 12  - Dara 16 mg/Kg D1 only with Dex 20 mg IV  - Velcade 1.3 mg/M2 D1,4,8 and 11  Cycle 9 and beyond (maintenance, 11/08/17??- 01/03/18)  Dara 16 mg/Kg Q 28 days  Dex 12 mg IV D1  Velcade Q 2 weeks??  10. DCEP   Cycle #1 - 01/13/18  ??  ASSESSMENT AND PLAN:??????????   ??  1. ??IgG kappa multiple myeloma / Plasma Cell Leukemia:??Relapsed disease??  - S/p??treatment??Dara/Velcade/Dex??x 8 cycles??(06/22/17 - 10/2017). Followed by??maintenance Dara??Q4 wks/Velcade??Q2ks/Dex??(started 11/08/17)  - Now w/ progressive disease based on myeloma labs (01/03/18) & PET scan (01/10/18), see above??  ??  PLAN:??DCEP for disease and pain control. Hope for allogeneic transplant in CR2, but needs to disease response and improvement in lung function??  ??  DCEP Cycle #1, Day + 6  ??  2. ??ID: Afebrile  - Cont Valtrex prophylaxis??  - S/p Diflucan 200 mg daily for thrush (01/13/18 - 01/18/18)  - Start Levaquin and Diflucan when [place] 1.5  ??  3. Heme:??Anemia d/t chemotherapy  - Transfuse for Hgb < 7 and Platelets <??20K  - No transfusion today  ??  4.??Metabolic: Stable renal fxn and e-lytes??except  for hypoNa &??steroid-induced hyperglycemia   ??  5. Pulmonary:??No acute issues, but currently on steroid taper for COPD per Dr. BlDorann Lodge - Pulm Nodule: Stable??on??CT chest ??From??04/23/17??& 10/18/17 & 11/26/17; cont to monitor.   - CTPA??(11/26/17): No PE;??resolution of??mild upper lobe tree-in-bud opacities; Mild emphysema. Two 3 mm LLL pulmonary nodules, stable  - PFT (12/27/17):????compared to??07/2016:??FVC 3.25 L, 66%, and FEV1  1.42 L, 38%. FEV1/FVC is 44%. This is unchanged from prior study. Air trapping seen on lung volumes. Diffusion capacity 3.91, 79% of predicted, down from 5.21??  - Follows up with Dr. Bloomer??for COPD  - Pulm following, appreciate recs  - Cont Dex taper for COPD:  8 mg bid x 2 days, 4 mg bid x 2 days, then stop (last day 01/20/18)  - Cont Albuterol inhaler prn & home nebulizer   ??  6. GI/Nutrition: Appetite and oral intake is good??  - Cont PPI ppx w/ steroids??  - Cont low microbial diet ??  Constipation:  Improved  - Cont Dulcolax & Senokot-s prn (01/16/18)   ??  7. Cardiac:??Mild HTN, possibly from hypervolemia d/t IVF  - H/o HLD &??has septal wall defect on echocardiogram.  - Echo (07/13/16): abnormal (paradoxical) septal motion is present with preserved LVEF 55-60%.  - Resume Lipitor   ??  8. Psych:  Concerned about disease progression and ability to get transplant   Anxiety/Depression: Ongoing  - Cont Zoloft??50 mg daily??  ??Insomnia:??No complaints??currently??  ??  9. Peripheral Neuropathy:??Ongoing  - Cont Gabapentin 300 mg??TID  ??  10. 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  11.??Acute on??Chronic left lower back pain:????Improving w/ chemotherapy; d/t Neoplasm and relapsed/progressive disease??  - S/p Radiation (09/01/17 - 09/15/17,??Fried)  - Cont Fentanyl patch??50 mcg/hr (increased 01/13/18) - Rx 01/18/18  -  Resume  Percocet 7.5/325 mg q 4hrs prn (No Rx on d/c)      Condition on discharge:  Stable    Discharge Instructions:  Return to [facility] on 01/19/18 for Neulasta injection, labs (CBC w/ diff, CMP, Mag & Phos) and provider visit.    The patient was advised on activity and dietary restrictions.    The patient was advised to follow up in the emergency department or contact the physician with any unresolved nausea/vomiting/diarrhea/pain or temperature greater than 100.5 F or any other unusual symptoms.       This discharge summary and plan was discussed and agreed upon with Dr. Hunt Oris.    Wayland Salinas, CNP

## 2018-01-18 NOTE — Discharge Instructions (Signed)
Continuity of Care Form    Patient Name: David Watkins   DOB:  06-Feb-1956  MRN:  [id number]    Admit date:  01/12/2018  Discharge date:  ***    Code Status Order: Full Code   Advance Directives:   Advance Care Flowsheet Documentation     Date/Time Healthcare Directive Type of Healthcare Directive Copy in Chart Healthcare Agent Appointed Healthcare Agent's Name Healthcare Agent's Phone Number    01/12/18 1843  Yes, patient has an advance directive for healthcare treatment  Durable power of attorney for health care  No, copy requested from family  Adult siblings  Truddie Crumble  --          Admitting Physician:  Terressa Koyanagi, MD  PCP: Emilee Hero, MD    Discharging Nurse: [facility] Unit/Room#: 3515/3515-01  Discharging Unit Phone Number: ***    Emergency Contact:   Extended Emergency Contact Information  Primary Emergency Contact: Lady Gary [place]  Home Phone: [phone]  Relation: Domestic Partner  Secondary Emergency Contact: Schubert,jiles  Home Phone: [phone]  Mobile Phone: [phone]  Relation: Brother/Sister    Past Surgical History:  Past Surgical History:   Procedure Laterality Date   ??? BONE MARROW BIOPSY     ??? BONE MARROW TRANSPLANT     ??? OTHER SURGICAL HISTORY Left 08/17/2016    trifusion cath placement   ??? PRE-MALIGNANT / BENIGN SKIN LESION EXCISION Left 03/29/2017    EXCISE LESION LEFT EXTERNAL EAR WITH FROZEN SECTION, FULL THICKNESS SKIN GRAFT       Immunization History:   Immunization History   Administered Date(s) Administered   ??? Hepatitis B, unspecified formulation 02/26/2017   ??? IPV (Ipol) 02/26/2017   ??? Influenza Virus Vaccine 06/01/2016   ??? Meningococcal MCV4P (Menactra) 02/26/2017   ??? Pneumococcal 13-valent Conjugate (Prevnar13) 02/26/2017   ??? Tdap (Boostrix, Adacel) 02/26/2017       Active Problems:  Patient Active Problem List   Diagnosis Code   ??? Multiple myeloma not having achieved remission (HCC) C90.00   ??? COPD, severe (HCC) J44.9   ???  Multiple myeloma in remission (HCC) C90.01   ??? Moderate malnutrition (HCC) E44.0   ??? Open wound of left ear S01.302A   ??? Ear lesion H93.90   ??? Skin lesion L98.9   ??? 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 Multiple myeloma and immunoproliferative neoplasms (HCC) C90.00, C88.9   ??? COPD exacerbation (HCC) J44.1   ??? Pulmonary nodule R91.1   ??? Pain R52       Isolation/Infection:   Isolation          No Isolation            Nurse Assessment:  Last Vital Signs: BP 121/75    Pulse 96    Temp 97.8 ??F (36.6 ??C) (Oral)    Resp 16    Ht 5\' 9"  (1.753 m)    Wt 194 lb 9.6 oz (88.3 kg)    SpO2 92%    BMI 28.74 kg/m??     Last documented pain score (0-10 scale): Pain Level: 8  Last Weight:   Wt Readings from Last 1 Encounters:   01/18/18 194 lb 9.6 oz (88.3 kg)     Mental Status:  {IP PT MENTAL STATUS:20030}    IV Access:  {MH COC IV ACCESS:304088262}    Nursing Mobility/ADLs:  Walking   {CHP DME PIRJ:188416606}  Transfer  {CHP DME  VOZD:664403474}  Bathing  {CHP DME QVZD:638756433}  Dressing  {CHP DME IRJJ:884166063}  Toileting  {CHP DME KZSW:109323557}  Feeding  {CHP DME DUKG:254270623}  Med Admin  {CHP DME JSEG:315176160}  Med Delivery   {MH COC MED Delivery:304088264}    Wound Care Documentation and Therapy:        Elimination:  Continence:   ?? Bowel: {YES / VP:71062}  ?? Bladder: {YES / IR:48546}  Urinary Catheter: {Urinary Catheter:304088013}   Colostomy/Ileostomy/Ileal Conduit: {YES / EV:03500}       Date of Last BM: ***    Intake/Output Summary (Last 24 hours) at 01/18/2018 1115  Last data filed at 01/18/2018 0656  Gross per 24 hour   Intake 2999 ml   Output 2850 ml   Net 149 ml     I/O last 3 completed shifts:  In: 3119 [P.O.:120; I.V.:2999]  Out: 4990 [Urine:4990]    Safety Concerns:     {MH COC Safety Concerns:304088272}    Impairments/Disabilities:      {MH COC Impairments/Disabilities:304088273}    Nutrition Therapy:  Current Nutrition Therapy:   {MH COC Diet List:304088271}    Routes of Feeding: {CHP DME  Other Feedings:304088042}  Liquids: {Slp liquid thickness:30034}  Daily Fluid Restriction: {CHP DME Yes amt example:304088041}  Last Modified Barium Swallow with Video (Video Swallowing Test): {Done Not Done XFGH:829937169}    Treatments at the Time of Hospital Discharge:   Respiratory Treatments: ***  Oxygen Therapy:  {Therapy; copd oxygen:17808}  Ventilator:    {MH CC Vent CVEL:381017510}    Rehab Therapies: {THERAPEUTIC INTERVENTION:[phone]}  Weight Bearing Status/Restrictions: {MH CC Weight Bearing:304508812}  Other Medical Equipment (for information only, NOT a DME order):  {EQUIPMENT:304520077}  Other Treatments: ***    Patient's personal belongings (please select all that are sent with patient):  {CHP DME Belongings:304088044}    RN SIGNATURE:  {Esignature:304088025}    CASE MANAGEMENT/SOCIAL WORK SECTION    Inpatient Status Date: ***    Readmission Risk Assessment Score:  Readmission Risk              Risk of Unplanned Readmission:        0           Discharging to Facility/ Agency   ?? Name:   ?? Address:  ?? Phone:  ?? Fax:    Dialysis Facility (if applicable)   ?? Name:  ?? Address:  ?? Dialysis Schedule:  ?? Phone:  ?? Fax:    Case Manager/Social Worker signature: {Esignature:304088025}    PHYSICIAN SECTION    Prognosis: {Prognosis:[phone]}    Condition at Discharge: {MH Patient Condition:304088024}    Rehab Potential (if transferring to Rehab): {Prognosis:[phone]}    Recommended Labs or Other Treatments After Discharge: ***    Physician Certification: I certify the above information and transfer of David Watkins  is necessary for the continuing treatment of the diagnosis listed and that he requires {Admit to Appropriate Level of Care:20763} for {GREATER/LESS:304500278} 30 days.     Update Admission H&P: {CHP DME Changes in CHENI:778242353}    PHYSICIAN SIGNATURE:  {Esignature:304088025}

## 2018-01-18 NOTE — Progress Notes (Signed)
VSS. Reassessment completed. Pt resting comfortably in bed without any signs of distress at this time. Respirations are easy and unlabored. Call light within reach. Will continue to monitor. Electronically signed by Regis Bill, RN on 01/18/2018 at 12:01 AM

## 2018-01-20 LAB — REFERENCE CASE BLOOD BANK

## 2018-01-21 ENCOUNTER — Emergency Department: Admit: 2018-01-22 | Payer: BLUE CROSS/BLUE SHIELD | Primary: Hematology & Oncology

## 2018-01-21 DIAGNOSIS — J441 Chronic obstructive pulmonary disease with (acute) exacerbation: Principal | ICD-10-CM

## 2018-01-21 NOTE — ED Triage Notes (Signed)
PT HAS HX OF MULTIPLE MYLOMA. PT FINISHED ROUND OF CHEMO ON Tuesday. PT STATES TODAY HE BEGAN HAVING A SHOOTING PAIN THAT STARTS IN HIS FACE AND JAW AND RADIATES DOWN CHEST. PT ALSO C/O SEVERE SOB. PT'S O2 SATURATION READING 95% BUT PLACED ON TWO LITERS FOR COMFORT.

## 2018-01-21 NOTE — ED Provider Notes (Signed)
ED Attending Attestation Note     Date of evaluation: 01/21/2018    This patient was seen by the advance practice provider.  I have seen and examined the patient, agree with the workup, evaluation, management and diagnosis. The care plan has been discussed.  I have reviewed the ECG and concur with the APP's interpretation.  My assessment reveals middle-aged gentleman who appears chronically ill he developed the sudden onset of chest pain which is pleuritic worse with movement coughing and deep breathing some relieved by rest.  No recent changes in medications.Saunders Glance, MD  01/22/18 Laureen Abrahams

## 2018-01-21 NOTE — ED Notes (Addendum)
PT TO CT SCAN CT TECH.             Bard Herbert, RN  01/21/18 2325

## 2018-01-21 NOTE — ED Provider Notes (Signed)
[facility] ENCOUNTER          PHYSICIAN ASSISTANT NOTE       Date of evaluation: 01/21/2018    Chief Complaint     Shortness of Breath      History of Present Illness     IRBIN FINES is a 62 y.o. male with history of COPD, multiple myeloma and chronic pain who presents with complaint of chest pain and shortness of breath.  Patient has been having ongoing issues with shortness of breath.  He states that a couple of months ago he was diagnosed with influenza and ever since has never fully resolved.  He was recently admitted and was discharged on a decadron taper which completed yesterday, however, he was having persistent shortness of breath and was seen by oncology today and placed on prednisone.  Patient states that he used a nebulizer treatment this evening but he continues to have persistent shortness of breath.  He's also developed a pain that he states is in his chest and his neck to his maxillary sinuses.  He states he feels like it starts in his maxillary sinuses that radiates inferiorly.  He describes a tightness in his chest when he tries to take a breath.  Denies lower extremity swelling or pain.  Patient had chemotherapy on Monday and was seen on Tuesday to get Neupogen and then again evaluated today and had significant drop in white blood cell per patient's wife.    Review of Systems     Review of Systems   Constitutional: Negative for chills and fever.   HENT: Positive for sinus pain. Negative for sore throat.    Eyes: Negative for redness.   Respiratory: Positive for chest tightness and shortness of breath. Negative for stridor.    Cardiovascular: Positive for chest pain and palpitations. Negative for leg swelling.   Gastrointestinal: Negative for abdominal pain, diarrhea, nausea and vomiting.   Skin: Negative for color change and wound.   Neurological: Negative for weakness, light-headedness, numbness and headaches.   Hematological: Does not bruise/bleed easily.    Psychiatric/Behavioral: Negative for confusion.   All other systems reviewed and are negative.      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  He has a past surgical history that includes bone marrow biopsy; other surgical history (Left, 08/17/2016); bone marrow transplant; and pre-malignant / benign skin lesion excision (Left, 03/29/2017).  His family history includes Heart Attack in his brother; Heart Disease in his mother; Lung Cancer in his father.  He reports that he quit smoking about 5 years ago. He has never used smokeless tobacco. He reports that he drinks alcohol. He reports that he does not use drugs.    Medications     Previous Medications    ALBUTEROL SULFATE HFA (PROAIR HFA) 108 (90 BASE) MCG/ACT INHALER    Inhale 2 puffs into the lungs every 6 hours as needed for Wheezing    ATORVASTATIN (LIPITOR) 10 MG TABLET    Take 10 mg by mouth nightly     BISACODYL (DULCOLAX) 5 MG EC TABLET    Take 2 tablets by mouth daily as needed for Constipation    CALCIUM CARBONATE (OSCAL) 500 MG TABS TABLET    Take 500 mg by mouth 2 times daily     DEXAMETHASONE (DECADRON)  4 MG TABLET    8 mg x 1 dose on Tuesday evening, then 4 mg twice daily x 2 days    FENTANYL (DURAGESIC) 50 MCG/HR    Place 1 patch onto the skin every 72 hours for 30 days.    FLUTICASONE-UMECLIDIN-VILANT (TRELEGY ELLIPTA) 100-62.5-25 MCG/INH AEPB    Inhale 1 puff into the lungs daily    GABAPENTIN (NEURONTIN) 300 MG CAPSULE    Take 1 capsule by mouth 3 times daily for 30 days.Marland Kitchen    LIDOCAINE-PRILOCAINE (EMLA) 2.5-2.5 % CREAM    Apply topically as needed for Pain Apply topically as needed.    ONDANSETRON (ZOFRAN) 4 MG TABLET    Take 1 tablet by mouth every 6 hours as needed for Nausea or Vomiting    OXYCODONE-ACETAMINOPHEN (PERCOCET) 7.5-325 MG PER TABLET    Take 1 tablet by mouth every  4 hours as needed for Pain.Marland Kitchen    PANTOPRAZOLE (PROTONIX) 40 MG TABLET    Take 1 tablet by mouth every morning (before breakfast)    PROCHLORPERAZINE (COMPAZINE) 10 MG TABLET    Take 1 tablet by mouth every 6 hours as needed (Nausea)    SENNOSIDES-DOCUSATE SODIUM (SENOKOT-S) 8.6-50 MG TABLET    Take 2 tablets by mouth daily    SERTRALINE (ZOLOFT) 50 MG TABLET    Take 50 mg by mouth daily    VALACYCLOVIR (VALTREX) 500 MG TABLET    Take 500 mg by mouth 2 times daily       Allergies     He is allergic to lenalidomide; pomalidomide; and ceclor [cefaclor].    Physical Exam     INITIAL VITALS: BP: 123/88, Temp: 97.8 ??F (36.6 ??C), Pulse: 114, Resp: 20, SpO2: 97 %  Physical Exam   Constitutional: He is oriented to person, place, and time. He appears well-developed and well-nourished. No distress.   Patient is tearful on examination   HENT:   Head: Normocephalic and atraumatic.   Eyes: Conjunctivae are normal.   Neck: Normal range of motion.   Cardiovascular: Regular rhythm, normal heart sounds and intact distal pulses. Tachycardia present.   Pulmonary/Chest: Effort normal. No respiratory distress. He has decreased breath sounds.   Abdominal: Soft. There is no tenderness.   Musculoskeletal: Normal range of motion. He exhibits no edema or tenderness.   Neurological: He is alert and oriented to person, place, and time.   Skin: Skin is warm and dry. He is not diaphoretic.   Psychiatric: He has a normal mood and affect. His behavior is normal. Judgment and thought content normal.   Nursing note and vitals reviewed.      Diagnostic Results     EKG   Interpreted in conjunction with emergency department physician No att. providers found  Rhythm: sinus tachycardia  Rate: 110-120  Axis: normal  Ectopy: none  Conduction: normal  ST Segments: no acute change  T Waves: flattening in  v2  Q Waves:none  Clinical Impression: non-specific EKG  Comparison:  November 26 2017    RADIOLOGY:  CTA PULMONARY W CONTRAST   Final Result   1.  No pulmonary  embolism.   2.  Patchy groundglass opacities and tree-in-bud are seen throughout the    lungs which are new compared to prior examination.  This most likely    represents atypical infection/inflammation.  Clinical correlation is    recommended.   3.  Paraseptal and centrilobular emphysematous change of the lungs.   4.  Enlarged left axillary lymph nodes are not  significantly changed    compared to PET/CT on 01/10/2018.  This is worrisome for nodal metastasis.   5.  Scattered lucent lesions are seen throughout the ribs, spine, and    scapula.  Findings may represent multiple myeloma or osseous metastasis.          XR CHEST STANDARD (2 VW)   Final Result      No acute cardiopulmonary findings.          LABS:   Results for orders placed or performed during the hospital encounter of 01/21/18   CBC auto differential   Result Value Ref Range    WBC 0.2 (LL) 4.0 - 11.0 K/uL    RBC 3.58 (L) 4.20 - 5.90 M/uL    Hemoglobin 11.2 (L) 13.5 - 17.5 g/dL    Hematocrit 34.0 (L) 40.5 - 52.5 %    MCV 95.0 80.0 - 100.0 fL    MCH 31.4 26.0 - 34.0 pg    MCHC 33.1 31.0 - 36.0 g/dL    RDW 18.2 (H) 12.4 - 15.4 %    Platelets 38 (LL) 135 - 450 K/uL    MPV 8.6 5.0 - 10.5 fL   Basic Metabolic Panel   Result Value Ref Range    Sodium 131 (L) 136 - 145 mmol/L    Potassium 4.0 3.5 - 5.1 mmol/L    Chloride 96 (L) 99 - 110 mmol/L    CO2 22 21 - 32 mmol/L    Anion Gap 13 3 - 16    Glucose 150 (H) 70 - 99 mg/dL    BUN 18 7 - 20 mg/dL    CREATININE 0.6 (L) 0.9 - 1.3 mg/dL    GFR Non-African American >60 >60    GFR African American >60 >60    Calcium 8.7 8.3 - 10.6 mg/dL   Troponin (lab)   Result Value Ref Range    Troponin <0.01 <0.01 ng/mL   Brain Natriuretic Peptide   Result Value Ref Range    Pro-BNP 324 (H) 0 - 124 pg/mL   POCT Venous   Result Value Ref Range    pH, Ven 7.381 7.350 - 7.450    pCO2, Ven 37.8 (L) 40.0 - 50.0 mm Hg    pO2, Ven 31 Not Established mm Hg    HCO3, Venous 22.4 (L) 23.0 - 29.0 mmol/L    Base Excess, Ven -3 -3 - 3    O2 Sat,  Ven 58 Not Established %    TC02 (Calc), Ven 24 Not Established mmol/L    Lactate 0.80 0.40 - 2.00 mmol/L    Sample Type VEN     Performed on SEE BELOW        ED BEDSIDE ULTRASOUND:      RECENT VITALS:  BP: 112/74, Temp: 97.8 ??F (36.6 ??C), Pulse: 116, Resp: 15, SpO2: 96 %     Procedures         ED Course     Nursing Notes, Past Medical Hx,Past Surgical Hx, Social Hx, Allergies, and Family Hx were reviewed.    The patient was given the following medications:  Orders Placed This Encounter   Medications   ??? ipratropium-albuterol (DUONEB) nebulizer solution 1 ampule   ??? morphine injection 4 mg   ??? ondansetron (ZOFRAN) injection 4 mg   ??? iopamidol (ISOVUE-370) 76 % injection 80 mL   ??? meropenem (MERREM) 1 g in sodium chloride 0.9 % 100 mL IVPB (mini-bag)   ??? 0.9 % sodium chloride bolus   ???  morphine injection 4 mg       CONSULTS:  [place] / ASSESSMENT / PLAN     OBINNA EHRESMAN is a 62 y.o. male sent to the emergency department with complaint of shortness of breath and chest pain.  Patient does have a history of COPD and is being managed with steroids.  He finished a taper of Decadron that he was prescribed at his last hospital admission, yesterday but saw oncology today and told him that he was continuing to have shortness of breath and was started on prednisone.  This evening he took the medication but has been increasingly short of breath.  He tried an albuterol nebulizer at home with no relief.  He describes a new tightness and pain in his chest which brought him to the emergency department.  With his history of multiple myeloma and tachycardia concerning for possible PE as the etiology.  Patient had decreased breath sounds and COPD exacerbation is also on the differential.      Patient was given a DuoNeb and a dose of morphine which she states did improve his pain.  CBC showed leukopenia with white blood cell count of 0.2 and platelets of 38.  BMP is unremarkable.  Troponin  normal.  ProBNP 324.  VBG with normal pH.  Chest x-ray showed no acute cardiopulmonary findings.  CTPA showed no PE as a patchy groundglass opacity and treated by seen throughout the lungs which is new compared to prior.  Could represent atypical infection versus inflammation.  Clinical correlation recommended.  With patient's tachycardia and neutropenia concern for possible infection since he also had a new onset of chest pain with shortness of breath that is unusual for his COPD.    I spoke with Dr. Corky Sox who is on-call for patient's oncologist and at this point we will prophylactically antibiosis him with Zosyn and he will be admitted for continued management and monitoring.  Additional labs were ordered since the patient is neutropenic and we are treating him for possible infection which process at this time.  Patient is in agreement with the plan of admission.    This patient was also evaluated by the attending physician. All care plans were discussed and agreed upon.    Clinical Impression     1. Atypical pneumonia    2. Neutropenia associated with infection ([place])    3. Sepsis due to pneumonia [facility])        Disposition     PATIENT REFERRED TO:  No follow-up provider specified.    DISCHARGE MEDICATIONS:  New Prescriptions    No medications on file       [place], [place]  01/22/18 [phone]

## 2018-01-22 ENCOUNTER — Inpatient Hospital Stay
Admission: EM | Admit: 2018-01-22 | Discharge: 2018-02-08 | Disposition: A | Discharge status: Home Health | Payer: BLUE CROSS/BLUE SHIELD | Source: Other Acute Inpatient Hospital | Admitting: Hematology & Oncology

## 2018-01-22 ENCOUNTER — Inpatient Hospital Stay: Admit: 2018-01-22 | Payer: BLUE CROSS/BLUE SHIELD | Primary: Hematology & Oncology

## 2018-01-22 LAB — POCT VENOUS
Base Excess, Ven: -3 (ref <=3)
HCO3, Venous: 22.4 mmol/L — ABNORMAL LOW (ref 23.0–29.0)
Lactate: 0.8 mmol/L (ref 0.40–2.00)
O2 Sat, Ven: 58 %
TC02 (Calc), Ven: 24 mmol/L
pCO2, Ven: 37.8 mm Hg — ABNORMAL LOW (ref 40.0–50.0)
pH, Ven: 7.381 (ref 7.350–7.450)
pO2, Ven: 31 mm Hg

## 2018-01-22 LAB — BASIC METABOLIC PANEL
Anion Gap: 13 (ref 3–16)
Anion Gap: 15 (ref 3–16)
BUN: 14 mg/dL (ref 7–20)
BUN: 18 mg/dL (ref 7–20)
CO2: 22 mmol/L (ref 21–32)
CO2: 23 mmol/L (ref 21–32)
Calcium: 8.4 mg/dL (ref 8.3–10.6)
Calcium: 8.7 mg/dL (ref 8.3–10.6)
Chloride: 95 mmol/L — ABNORMAL LOW (ref 99–110)
Chloride: 96 mmol/L — ABNORMAL LOW (ref 99–110)
Creatinine: 0.6 mg/dL — ABNORMAL LOW (ref 0.9–1.3)
Creatinine: 0.7 mg/dL — ABNORMAL LOW (ref 0.9–1.3)
GFR African American: >60 (ref >60)
GFR African American: >60 (ref >60)
GFR Non-African American: >60 (ref >60)
GFR Non-African American: >60 (ref >60)
Glucose: 150 mg/dL — ABNORMAL HIGH (ref 70–99)
Glucose: 172 mg/dL — ABNORMAL HIGH (ref 70–99)
Potassium: 4 mmol/L (ref 3.5–5.1)
Potassium: 4.7 mmol/L (ref 3.5–5.1)
Sodium: 131 mmol/L — ABNORMAL LOW (ref 136–145)
Sodium: 133 mmol/L — ABNORMAL LOW (ref 136–145)

## 2018-01-22 LAB — POCT ARTERIAL
Base Excess, Arterial: -2 (ref <=3)
Base Excess, Arterial: -2 (ref <=3)
Calcium, Ionized: 1.17 mmol/L (ref 1.12–1.32)
HCO3, Arterial: 23.9 mmol/L (ref 21.0–29.0)
HCO3, Arterial: 22.9 mmol/L (ref 21.0–29.0)
Lactate: 1.37 mmol/L (ref 0.40–2.00)
O2 Sat, Arterial: 83 % — ABNORMAL LOW (ref 93–100)
O2 Sat, Arterial: 98 % (ref 93–100)
POC Glucose: 137 mg/dl — ABNORMAL HIGH (ref 70–99)
POC Potassium: 4.6 mmol/L (ref 3.5–5.1)
POC Sodium: 131 mmol/L — ABNORMAL LOW (ref 136–145)
TCO2, Arterial: 25 mmol/L
TCO2, Arterial: 24 mmol/L
pCO2, Arterial: 47.1 mm Hg — ABNORMAL HIGH (ref 35.0–45.0)
pCO2, Arterial: 36.8 mm Hg (ref 35.0–45.0)
pH, Arterial: 7.313 — ABNORMAL LOW (ref 7.350–7.450)
pH, Arterial: 7.403 (ref 7.350–7.450)
pO2, Arterial: 52.5 mm Hg — ABNORMAL LOW (ref 75.0–108.0)
pO2, Arterial: 103.5 mm Hg (ref 75.0–108.0)

## 2018-01-22 LAB — CBC WITH AUTO DIFFERENTIAL
Hematocrit: 29.3 % — ABNORMAL LOW (ref 40.5–52.5)
Hematocrit: 31.1 % — ABNORMAL LOW (ref 40.5–52.5)
Hematocrit: 34 % — ABNORMAL LOW (ref 40.5–52.5)
Hemoglobin: 10.3 g/dL — ABNORMAL LOW (ref 13.5–17.5)
Hemoglobin: 11.2 g/dL — ABNORMAL LOW (ref 13.5–17.5)
Hemoglobin: 9.9 g/dL — ABNORMAL LOW (ref 13.5–17.5)
MCH: 31.1 pg (ref 26.0–34.0)
MCH: 31.4 pg (ref 26.0–34.0)
MCH: 31.7 pg (ref 26.0–34.0)
MCHC: 33.1 g/dL (ref 31.0–36.0)
MCHC: 33.1 g/dL (ref 31.0–36.0)
MCHC: 33.7 g/dL (ref 31.0–36.0)
MCV: 93.8 fL (ref 80.0–100.0)
MCV: 94 fL (ref 80.0–100.0)
MCV: 95 fL (ref 80.0–100.0)
MPV: 7.6 fL (ref 5.0–10.5)
MPV: 8.1 fL (ref 5.0–10.5)
MPV: 8.6 fL (ref 5.0–10.5)
Platelets: 21 10*3/uL — ABNORMAL LOW (ref 135–450)
Platelets: 26 10*3/uL — ABNORMAL LOW (ref 135–450)
Platelets: 38 10*3/uL — CL (ref 135–450)
RBC: 3.12 M/uL — ABNORMAL LOW (ref 4.20–5.90)
RBC: 3.31 M/uL — ABNORMAL LOW (ref 4.20–5.90)
RBC: 3.58 M/uL — ABNORMAL LOW (ref 4.20–5.90)
RDW: 17.9 % — ABNORMAL HIGH (ref 12.4–15.4)
RDW: 18.2 % — ABNORMAL HIGH (ref 12.4–15.4)
RDW: 18.4 % — ABNORMAL HIGH (ref 12.4–15.4)
WBC: 0.1 10*3/uL — CL (ref 4.0–11.0)
WBC: 0.1 10*3/uL — CL (ref 4.0–11.0)
WBC: 0.2 10*3/uL — CL (ref 4.0–11.0)

## 2018-01-22 LAB — URINALYSIS
Bilirubin Urine: NEGATIVE
Glucose, Ur: NEGATIVE mg/dL
Ketones, Urine: NEGATIVE mg/dL
Leukocyte Esterase, Urine: NEGATIVE
Nitrite, Urine: NEGATIVE
Protein, UA: 30 mg/dL — AB
Specific Gravity, UA: 1.015 (ref 1.005–1.030)
Urobilinogen, Urine: 0.2 E.U./dL (ref <2.0)
pH, UA: 6 (ref 5.0–8.0)

## 2018-01-22 LAB — HEPATIC FUNCTION PANEL
ALT: 45 U/L — ABNORMAL HIGH (ref 10–40)
AST: 16 U/L (ref 15–37)
Albumin: 3.4 g/dL (ref 3.4–5.0)
Alkaline Phosphatase: 66 U/L (ref 40–129)
Bilirubin, Direct: 0.3 mg/dL (ref 0.0–0.3)
Bilirubin, Indirect: 0.4 mg/dL (ref 0.0–1.0)
Total Bilirubin: 0.7 mg/dL (ref 0.0–1.0)
Total Protein: 6.5 g/dL (ref 6.4–8.2)

## 2018-01-22 LAB — COMPREHENSIVE METABOLIC PANEL
ALT: 46 U/L — ABNORMAL HIGH (ref 10–40)
AST: 20 U/L (ref 15–37)
Albumin/Globulin Ratio: 1 — ABNORMAL LOW (ref 1.1–2.2)
Albumin: 3.2 g/dL — ABNORMAL LOW (ref 3.4–5.0)
Alkaline Phosphatase: 65 U/L (ref 40–129)
Anion Gap: 13 (ref 3–16)
BUN: 15 mg/dL (ref 7–20)
CO2: 22 mmol/L (ref 21–32)
Calcium: 8.2 mg/dL — ABNORMAL LOW (ref 8.3–10.6)
Chloride: 93 mmol/L — ABNORMAL LOW (ref 99–110)
Creatinine: 0.6 mg/dL — ABNORMAL LOW (ref 0.9–1.3)
GFR African American: >60 (ref >60)
GFR Non-African American: >60 (ref >60)
Globulin: 3.1 g/dL
Glucose: 202 mg/dL — ABNORMAL HIGH (ref 70–99)
Potassium: 4.4 mmol/L (ref 3.5–5.1)
Sodium: 128 mmol/L — ABNORMAL LOW (ref 136–145)
Total Bilirubin: 0.5 mg/dL (ref 0.0–1.0)
Total Protein: 6.3 g/dL — ABNORMAL LOW (ref 6.4–8.2)

## 2018-01-22 LAB — PHOSPHORUS: Phosphorus: 3 mg/dL (ref 2.5–4.9)

## 2018-01-22 LAB — TROPONIN
Troponin: <0.01 ng/mL (ref <0.01)
Troponin: <0.01 ng/mL (ref <0.01)

## 2018-01-22 LAB — MICROSCOPIC URINALYSIS

## 2018-01-22 LAB — MAGNESIUM
Magnesium: 1.4 mg/dL — ABNORMAL LOW (ref 1.80–2.40)
Magnesium: 1.5 mg/dL — ABNORMAL LOW (ref 1.80–2.40)

## 2018-01-22 LAB — BRAIN NATRIURETIC PEPTIDE: Pro-BNP: 324 pg/mL — ABNORMAL HIGH (ref 0–124)

## 2018-01-22 LAB — PROTIME-INR
INR: 1.36 — ABNORMAL HIGH (ref 0.86–1.14)
Protime: 15.5 s — ABNORMAL HIGH (ref 9.8–13.0)

## 2018-01-22 LAB — LACTATE DEHYDROGENASE: LD: 187 U/L (ref 100–190)

## 2018-01-22 LAB — URIC ACID: Uric Acid, Serum: 2.5 mg/dL — ABNORMAL LOW (ref 3.5–7.2)

## 2018-01-22 MED ORDER — ONDANSETRON HCL 8 MG PO TABS
8 MG | Freq: Four times a day (QID) | ORAL | Status: DC | PRN
Start: 2018-01-22 — End: 2018-02-08
  Administered 2018-01-30: 01:00:00 8 mg via ORAL

## 2018-01-22 MED ORDER — SALINE MOUTHWASH
Freq: Four times a day (QID) | Status: DC
Start: 2018-01-22 — End: 2018-02-08
  Administered 2018-01-22 – 2018-02-08 (×42): 15 mL via ORAL

## 2018-01-22 MED ORDER — IOPAMIDOL 76 % IV SOLN
76 % | Freq: Once | INTRAVENOUS | Status: AC | PRN
Start: 2018-01-22 — End: 2018-01-21
  Administered 2018-01-22: 04:00:00 80 mL via INTRAVENOUS

## 2018-01-22 MED ORDER — MORPHINE SULFATE 4 MG/ML IJ SOLN
4 MG/ML | INTRAMUSCULAR | Status: DC | PRN
Start: 2018-01-22 — End: 2018-01-26
  Administered 2018-01-22 – 2018-01-25 (×11): 4 mg via INTRAVENOUS

## 2018-01-22 MED ORDER — SODIUM CHLORIDE 0.9 % IV BOLUS
0.9 % | Freq: Once | INTRAVENOUS | Status: AC
Start: 2018-01-22 — End: 2018-01-22
  Administered 2018-01-22: 05:00:00 1000 mL via INTRAVENOUS

## 2018-01-22 MED ORDER — PANTOPRAZOLE SODIUM 40 MG PO TBEC
40 MG | Freq: Every day | ORAL | Status: DC
Start: 2018-01-22 — End: 2018-02-08
  Administered 2018-01-22 – 2018-02-08 (×17): 40 mg via ORAL

## 2018-01-22 MED ORDER — LORAZEPAM 2 MG/ML IJ SOLN
2 MG/ML | INTRAMUSCULAR | Status: DC
Start: 2018-01-22 — End: 2018-01-24
  Administered 2018-01-22 – 2018-01-24 (×9): 1 mg via INTRAVENOUS

## 2018-01-22 MED ORDER — TIOTROPIUM BROMIDE MONOHYDRATE 18 MCG IN CAPS
18 MCG | Freq: Every day | RESPIRATORY_TRACT | Status: DC
Start: 2018-01-22 — End: 2018-02-08
  Administered 2018-01-22 – 2018-02-08 (×16): 18 ug via RESPIRATORY_TRACT

## 2018-01-22 MED ORDER — ASPIRIN 325 MG PO TBEC
325 MG | Freq: Every day | ORAL | Status: DC
Start: 2018-01-22 — End: 2018-01-22

## 2018-01-22 MED ORDER — FENTANYL 50 MCG/HR TD PT72
50 MCG/HR | TRANSDERMAL | Status: DC
Start: 2018-01-22 — End: 2018-02-08
  Administered 2018-01-23 – 2018-02-07 (×6): 1 via TRANSDERMAL

## 2018-01-22 MED ORDER — SENNA-DOCUSATE SODIUM 8.6-50 MG PO TABS
Freq: Every day | ORAL | Status: DC
Start: 2018-01-22 — End: 2018-02-08
  Administered 2018-01-22 – 2018-02-08 (×18): 2 via ORAL

## 2018-01-22 MED ORDER — METHYLPREDNISOLONE SODIUM SUCC 125 MG IJ SOLR
125 MG | Freq: Once | INTRAMUSCULAR | Status: AC
Start: 2018-01-22 — End: 2018-01-22

## 2018-01-22 MED ORDER — ATORVASTATIN CALCIUM 20 MG PO TABS
20 MG | Freq: Every evening | ORAL | Status: DC
Start: 2018-01-22 — End: 2018-02-08
  Administered 2018-01-23 – 2018-02-08 (×17): 10 mg via ORAL

## 2018-01-22 MED ORDER — SALINE MOUTHWASH
Status: DC | PRN
Start: 2018-01-22 — End: 2018-02-08

## 2018-01-22 MED ORDER — MORPHINE SULFATE 4 MG/ML IJ SOLN
4 MG/ML | INTRAMUSCULAR | Status: AC | PRN
Start: 2018-01-22 — End: 2018-01-21
  Administered 2018-01-22 (×2): 4 mg via INTRAVENOUS

## 2018-01-22 MED ORDER — NORMAL SALINE FLUSH 0.9 % IV SOLN
0.9 % | Freq: Two times a day (BID) | INTRAVENOUS | Status: DC
Start: 2018-01-22 — End: 2018-02-08
  Administered 2018-01-22 – 2018-02-08 (×32): 10 mL via INTRAVENOUS

## 2018-01-22 MED ORDER — METHYLPREDNISOLONE SODIUM SUCC 125 MG IJ SOLR
125 MG | Freq: Three times a day (TID) | INTRAMUSCULAR | Status: DC
Start: 2018-01-22 — End: 2018-01-25
  Administered 2018-01-23 – 2018-01-25 (×8): 80 mg via INTRAVENOUS

## 2018-01-22 MED ORDER — ALBUTEROL SULFATE (2.5 MG/3ML) 0.083% IN NEBU
RESPIRATORY_TRACT | Status: DC | PRN
Start: 2018-01-22 — End: 2018-01-24
  Administered 2018-01-23 – 2018-01-24 (×3): 2.5 mg via RESPIRATORY_TRACT

## 2018-01-22 MED ORDER — SODIUM CHLORIDE 0.9 % IV SOLN
0.9 % | INTRAVENOUS | Status: AC
Start: 2018-01-22 — End: 2018-01-22
  Administered 2018-01-22: 18:00:00 via INTRAVENOUS

## 2018-01-22 MED ORDER — MORPHINE SULFATE 4 MG/ML IJ SOLN
4 MG/ML | Freq: Once | INTRAMUSCULAR | Status: AC
Start: 2018-01-22 — End: 2018-01-22
  Administered 2018-01-22: 05:00:00 4 mg via INTRAVENOUS

## 2018-01-22 MED ORDER — ALTEPLASE 2 MG IJ SOLR
2 MG | INTRAMUSCULAR | Status: DC | PRN
Start: 2018-01-22 — End: 2018-02-08

## 2018-01-22 MED ORDER — LEVALBUTEROL HCL 1.25 MG/0.5ML IN NEBU
1.25 MG/0.5ML | RESPIRATORY_TRACT | Status: DC
Start: 2018-01-22 — End: 2018-02-07
  Administered 2018-01-22 – 2018-02-07 (×53): 1.25 mg via RESPIRATORY_TRACT

## 2018-01-22 MED ORDER — LORAZEPAM 2 MG/ML IJ SOLN
2 MG/ML | INTRAMUSCULAR | Status: AC
Start: 2018-01-22 — End: 2018-01-22
  Administered 2018-01-22: 17:00:00 1 via INTRAVENOUS

## 2018-01-22 MED ORDER — FUROSEMIDE 10 MG/ML IJ SOLN
10 MG/ML | Freq: Once | INTRAMUSCULAR | Status: AC
Start: 2018-01-22 — End: 2018-01-22
  Administered 2018-01-22: 17:00:00 20 mg via INTRAVENOUS

## 2018-01-22 MED ORDER — IPRATROPIUM-ALBUTEROL 0.5-2.5 (3) MG/3ML IN SOLN
Freq: Once | RESPIRATORY_TRACT | Status: AC
Start: 2018-01-22 — End: 2018-01-21
  Administered 2018-01-22: 02:00:00 1 via RESPIRATORY_TRACT

## 2018-01-22 MED ORDER — MORPHINE SULFATE (PF) 2 MG/ML IV SOLN
2 MG/ML | INTRAVENOUS | Status: DC | PRN
Start: 2018-01-22 — End: 2018-01-26
  Administered 2018-01-22 – 2018-01-26 (×5): 2 mg via INTRAVENOUS

## 2018-01-22 MED ORDER — FLUTICASONE-UMECLIDIN-VILANT 100-62.5-25 MCG/INH IN AEPB
Freq: Every day | RESPIRATORY_TRACT | Status: DC
Start: 2018-01-22 — End: 2018-01-22

## 2018-01-22 MED ORDER — NORMAL SALINE FLUSH 0.9 % IV SOLN
0.9 % | INTRAVENOUS | Status: DC | PRN
Start: 2018-01-22 — End: 2018-02-08

## 2018-01-22 MED ORDER — SODIUM CHLORIDE 0.9 % IV SOLN (MINI-BAG)
0.9 % | Freq: Three times a day (TID) | INTRAVENOUS | Status: DC
Start: 2018-01-22 — End: 2018-02-08
  Administered 2018-01-22 – 2018-02-08 (×52): 1 g via INTRAVENOUS

## 2018-01-22 MED ORDER — POTASSIUM CHLORIDE 20 MEQ/50ML IV SOLN
20 MEQ/50ML | INTRAVENOUS | Status: DC | PRN
Start: 2018-01-22 — End: 2018-02-08

## 2018-01-22 MED ORDER — LEVALBUTEROL HCL 1.25 MG/0.5ML IN NEBU
1.25 MG/0.5ML | Freq: Four times a day (QID) | RESPIRATORY_TRACT | Status: DC | PRN
Start: 2018-01-22 — End: 2018-01-22

## 2018-01-22 MED ORDER — MEROPENEM 1 G IV SOLR
1 g | Freq: Once | INTRAVENOUS | Status: AC
Start: 2018-01-22 — End: 2018-01-22
  Administered 2018-01-22: 05:00:00 1 g via INTRAVENOUS

## 2018-01-22 MED ORDER — BISACODYL 5 MG PO TBEC
5 MG | Freq: Every day | ORAL | Status: DC | PRN
Start: 2018-01-22 — End: 2018-02-08

## 2018-01-22 MED ORDER — OYSTER SHELL CALCIUM 500 MG PO TABS
500 MG | Freq: Two times a day (BID) | ORAL | Status: DC
Start: 2018-01-22 — End: 2018-02-08
  Administered 2018-01-22 – 2018-02-08 (×35): 500 mg via ORAL

## 2018-01-22 MED ORDER — ONDANSETRON HCL 4 MG/2ML IJ SOLN
4 MG/2ML | Freq: Once | INTRAMUSCULAR | Status: AC
Start: 2018-01-22 — End: 2018-01-21
  Administered 2018-01-22: 02:00:00 4 mg via INTRAVENOUS

## 2018-01-22 MED ORDER — LEVALBUTEROL HCL 1.25 MG/0.5ML IN NEBU
1.25 MG/0.5ML | Freq: Four times a day (QID) | RESPIRATORY_TRACT | Status: DC | PRN
Start: 2018-01-22 — End: 2018-01-22
  Administered 2018-01-22: 17:00:00 1.25 mg via RESPIRATORY_TRACT

## 2018-01-22 MED ORDER — LIDOCAINE HCL (PF) 1 % IJ SOLN
1 | INTRAMUSCULAR | Status: AC
Start: 2018-01-22 — End: 2018-01-22

## 2018-01-22 MED ORDER — SODIUM CHLORIDE 0.9 % IV SOLN
0.9 % | INTRAVENOUS | Status: DC
Start: 2018-01-22 — End: 2018-01-24
  Administered 2018-01-24: 08:00:00 500 mL via INTRAVENOUS

## 2018-01-22 MED ORDER — LORAZEPAM 1 MG PO TABS
1 MG | ORAL | Status: DC | PRN
Start: 2018-01-22 — End: 2018-02-08
  Administered 2018-01-22 – 2018-02-08 (×10): 1 mg via ORAL

## 2018-01-22 MED ORDER — VALACYCLOVIR HCL 500 MG PO TABS
500 MG | Freq: Two times a day (BID) | ORAL | Status: DC
Start: 2018-01-22 — End: 2018-02-08
  Administered 2018-01-22 – 2018-02-08 (×35): 500 mg via ORAL

## 2018-01-22 MED ORDER — METHYLPREDNISOLONE SODIUM SUCC 125 MG IJ SOLR
125 MG | INTRAMUSCULAR | Status: AC
Start: 2018-01-22 — End: 2018-01-22
  Administered 2018-01-22: 18:00:00 125 via INTRAVENOUS

## 2018-01-22 MED ORDER — MORPHINE SULFATE 2 MG/ML IJ SOLN
2 MG/ML | INTRAMUSCULAR | Status: AC
Start: 2018-01-22 — End: 2018-01-23

## 2018-01-22 MED ORDER — MAGNESIUM SULFATE 4000 MG/100 ML IVPB PREMIX
4 GM/100ML | INTRAVENOUS | Status: DC | PRN
Start: 2018-01-22 — End: 2018-02-08

## 2018-01-22 MED ORDER — PROCHLORPERAZINE MALEATE 10 MG PO TABS
10 MG | Freq: Four times a day (QID) | ORAL | Status: DC | PRN
Start: 2018-01-22 — End: 2018-02-08

## 2018-01-22 MED ORDER — SODIUM CHLORIDE 0.9 % IV SOLN
0.9 % | INTRAVENOUS | Status: DC
Start: 2018-01-22 — End: 2018-01-22
  Administered 2018-01-22: 09:00:00 500 mL via INTRAVENOUS

## 2018-01-22 MED ORDER — NITROGLYCERIN 0.4 MG SL SUBL
0.4 MG | SUBLINGUAL | Status: AC
Start: 2018-01-22 — End: 2018-01-22
  Administered 2018-01-22: 23:00:00

## 2018-01-22 MED ORDER — ALBUTEROL SULFATE HFA 108 (90 BASE) MCG/ACT IN AERS
108 (90 Base) MCG/ACT | Freq: Four times a day (QID) | RESPIRATORY_TRACT | Status: DC | PRN
Start: 2018-01-22 — End: 2018-01-22

## 2018-01-22 MED ORDER — OXYCODONE-ACETAMINOPHEN 7.5-325 MG PO TABS
ORAL | Status: DC | PRN
Start: 2018-01-22 — End: 2018-02-08
  Administered 2018-01-22 – 2018-02-08 (×35): 1 via ORAL

## 2018-01-22 MED ORDER — ALBUTEROL SULFATE (2.5 MG/3ML) 0.083% IN NEBU
Freq: Four times a day (QID) | RESPIRATORY_TRACT | Status: DC
Start: 2018-01-22 — End: 2018-01-22
  Administered 2018-01-22 (×2): 2.5 mg via RESPIRATORY_TRACT

## 2018-01-22 MED ORDER — MAGNESIUM HYDROXIDE 400 MG/5ML PO SUSP
400 MG/5ML | Freq: Every day | ORAL | Status: DC | PRN
Start: 2018-01-22 — End: 2018-02-08

## 2018-01-22 MED ORDER — GABAPENTIN 300 MG PO CAPS
300 MG | Freq: Three times a day (TID) | ORAL | Status: DC
Start: 2018-01-22 — End: 2018-02-08
  Administered 2018-01-22 – 2018-02-08 (×52): 300 mg via ORAL

## 2018-01-22 MED ORDER — SERTRALINE HCL 50 MG PO TABS
50 MG | Freq: Every day | ORAL | Status: DC
Start: 2018-01-22 — End: 2018-02-08
  Administered 2018-01-22 – 2018-02-08 (×18): 50 mg via ORAL

## 2018-01-22 MED ORDER — FORMOTEROL FUMARATE 20 MCG/2ML IN NEBU
20 MCG/2ML | Freq: Two times a day (BID) | RESPIRATORY_TRACT | Status: DC
Start: 2018-01-22 — End: 2018-02-08
  Administered 2018-01-22 – 2018-02-08 (×32): 20 ug via RESPIRATORY_TRACT

## 2018-01-22 MED ORDER — BUDESONIDE 0.25 MG/2ML IN SUSP
0.25 MG/2ML | Freq: Two times a day (BID) | RESPIRATORY_TRACT | Status: DC
Start: 2018-01-22 — End: 2018-02-08
  Administered 2018-01-22 – 2018-02-08 (×32): 250 mg via RESPIRATORY_TRACT

## 2018-01-22 MED ORDER — HEPARIN (PORCINE) IN NACL 1000-0.9 UT/500ML-% IV SOLN
1000-0.9 | INTRAVENOUS | Status: AC
Start: 2018-01-22 — End: 2018-01-22

## 2018-01-22 MED FILL — MEROPENEM 1 G IV SOLR: 1 g | INTRAVENOUS | Qty: 1

## 2018-01-22 MED FILL — SPIRIVA HANDIHALER 18 MCG IN CAPS: 18 ug | RESPIRATORY_TRACT | Qty: 5

## 2018-01-22 MED FILL — STIMULANT LAXATIVE 5 MG PO TBEC: 5 mg | ORAL | Qty: 2

## 2018-01-22 MED FILL — PANTOPRAZOLE SODIUM 40 MG PO TBEC: 40 mg | ORAL | Qty: 1

## 2018-01-22 MED FILL — MORPHINE SULFATE 4 MG/ML IJ SOLN: 4 mg/mL | INTRAMUSCULAR | Qty: 1

## 2018-01-22 MED FILL — LEVALBUTEROL HCL 1.25 MG/0.5ML IN NEBU: 1.25 MG/0.5ML | RESPIRATORY_TRACT | Qty: 1

## 2018-01-22 MED FILL — PERFOROMIST 20 MCG/2ML IN NEBU: 20 MCG/2ML | RESPIRATORY_TRACT | Qty: 2

## 2018-01-22 MED FILL — SENNOSIDES-DOCUSATE SODIUM 8.6-50 MG PO TABS: 8.6-50 mg | ORAL | Qty: 2

## 2018-01-22 MED FILL — GABAPENTIN 300 MG PO CAPS: 300 mg | ORAL | Qty: 1

## 2018-01-22 MED FILL — SODIUM CHLORIDE 0.9 % IV SOLN: 0.9 % | INTRAVENOUS | Qty: 1000

## 2018-01-22 MED FILL — HEPARIN (PORCINE) IN NACL 1000-0.9 UT/500ML-% IV SOLN: 1000-0.9 UT/500ML-% | INTRAVENOUS | Qty: 500

## 2018-01-22 MED FILL — PROVENTIL HFA 108 (90 BASE) MCG/ACT IN AERS: 108 (90 Base) MCG/ACT | RESPIRATORY_TRACT | Qty: 8

## 2018-01-22 MED FILL — SERTRALINE HCL 50 MG PO TABS: 50 mg | ORAL | Qty: 1

## 2018-01-22 MED FILL — FUROSEMIDE 10 MG/ML IJ SOLN: 10 mg/mL | INTRAMUSCULAR | Qty: 2

## 2018-01-22 MED FILL — LIDOCAINE HCL (PF) 1 % IJ SOLN: 1 % | INTRAMUSCULAR | Qty: 30

## 2018-01-22 MED FILL — VALACYCLOVIR HCL 500 MG PO TABS: 500 mg | ORAL | Qty: 1

## 2018-01-22 MED FILL — OYSTER SHELL CALCIUM 500 MG PO TABS: 500 mg | ORAL | Qty: 1

## 2018-01-22 MED FILL — BUDESONIDE 0.25 MG/2ML IN SUSP: 0.25 MG/2ML | RESPIRATORY_TRACT | Qty: 2

## 2018-01-22 MED FILL — ALBUTEROL SULFATE (2.5 MG/3ML) 0.083% IN NEBU: RESPIRATORY_TRACT | Qty: 3

## 2018-01-22 MED FILL — ASPIRIN EC 325 MG PO TBEC: 325 mg | ORAL | Qty: 1

## 2018-01-22 MED FILL — NITROSTAT 0.4 MG SL SUBL: 0.4 mg | SUBLINGUAL | Qty: 25

## 2018-01-22 MED FILL — MAGNESIUM SULFATE 4 GM/100ML IV SOLN: 4 GM/100ML | INTRAVENOUS | Qty: 100

## 2018-01-22 MED FILL — FENTANYL 50 MCG/HR TD PT72: 50 ug/h | TRANSDERMAL | Qty: 1

## 2018-01-22 MED FILL — ONDANSETRON HCL 4 MG/2ML IJ SOLN: 4 MG/2ML | INTRAMUSCULAR | Qty: 2

## 2018-01-22 MED FILL — LORAZEPAM 1 MG PO TABS: 1 mg | ORAL | Qty: 1

## 2018-01-22 MED FILL — SOLU-MEDROL 125 MG IJ SOLR: 125 mg | INTRAMUSCULAR | Qty: 125

## 2018-01-22 MED FILL — LORAZEPAM 2 MG/ML IJ SOLN: 2 mg/mL | INTRAMUSCULAR | Qty: 1

## 2018-01-22 MED FILL — OXYCODONE-ACETAMINOPHEN 7.5-325 MG PO TABS: 7.5-325 mg | ORAL | Qty: 1

## 2018-01-22 MED FILL — IPRATROPIUM-ALBUTEROL 0.5-2.5 (3) MG/3ML IN SOLN: 0.5-2.5 (3) MG/3ML | RESPIRATORY_TRACT | Qty: 3

## 2018-01-22 MED FILL — MORPHINE SULFATE 2 MG/ML IJ SOLN: 2 mg/mL | INTRAMUSCULAR | Qty: 1

## 2018-01-22 NOTE — Progress Notes (Signed)
EKG complete, appears to be STEMI.  Code stemi called to operator for notification of interventional cardiologist and cath lab team.  Patient updated on situation.

## 2018-01-22 NOTE — Plan of Care (Signed)
Problem: Nutrition  Goal: Optimal nutrition therapy  01/22/2018 1239 by Gloris Ham, RD, LD  Note:   Nutrition Problem: Predicted suboptimal energy intake  Intervention: Food and/or Nutrient Delivery: Continue current diet  Nutritional Goals: Patient to tolerate and consume >75% of meals     01/22/2018 1238 by Gloris Ham, RD, LD  Outcome: Ongoing  Note:   Nutrition Problem: Predicted suboptimal energy intake  Intervention: Food and/or Nutrient Delivery: Continue current diet  Nutritional Goals: Patient to tolerate and consume >75% of meals

## 2018-01-22 NOTE — Progress Notes (Signed)
Blood gasses drawn from right wrist.  Patient tolerated well.  Pressure held for 5 minutes.

## 2018-01-22 NOTE — Oncology Nurse Navigation (Signed)
4 Eyes Admission Assessment     I agree as the admission nurse that 2 RN's have performed a thorough Head to Toe Skin Assessment on the patient. ALL assessment sites listed below have been assessed on admission.       Areas assessed by both nurses: David Watkins and David Watkins  [x]    Head, Face, and Ears   [x]    Shoulders, Back, and Chest  [x]    Arms, Elbows, and Hands   [x]    Coccyx, Sacrum, and Ischum  [x]    Legs, Feet, and Heels        Does the Patient have Skin Breakdown?  No         Braden Prevention initiated:  No   Wound Care Orders initiated:  No      WOC nurse consulted for Pressure Injury (Stage 3,4, Unstageable, DTI, NWPT, and Complex wounds):  No      Nurse 1 eSignature: Electronically signed by Ermalinda Memos, RN on 01/22/18 at 3:10 AM    **SHARE this note so that the co-signing nurse is able to place an eSignature**    Nurse 2 eSignature: Electronically signed by Braxton Feathers, RN on 01/22/18 at 5:22 AM

## 2018-01-22 NOTE — Consults (Signed)
CC:  Respiratory distress.    HPI:  David Watkins is referred by Dr. Viona Gilmore for severe shortness of breath.  He has a well-known history of COPD, followed by myself.  He has had recurring exacerbations over the last few months, which appeared to have been triggered, initially at least, by influenza infection.  He has been on multiple courses of systemic steroids, improves, but relapses.  He is bronchodilators responsive.  He presented to his oncologist yesterday with more shortness of breath and chest discomfort.  He was re-started on prednisone 40 mg, took one dose, but felt he was doing worse and presented to the hospital.  Overnight he has received several doses of bronchodilator.  At this time of this visit, he recently had a dose of lorazepam and is drowsy.  His wife is available for questions.  She states she has never seen him struggle this hard to breathe.  His cough has been nonproductive.  He has not had gastrointestinal complaints.  Mentation was alert until receiving sedation.    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  Former smoker, quit in 2014, with 30+-pack-year accumulation history.  Drinks occasional alcohol.    Family history: Mother and a brother have heart disease.  Father had lung cancer    Drug allergies: lenalidomide causes rash.  Pomalidomide causes hives.  Ceclor causes hives    Review of systems: Cannot be obtained at this time because of lethargy    Physical Exam: Lethargic but arousable, minimal verbal response.  Tachypneic and using accessory respiratory muscles.  Pulse 140, regular.  Respirations 35-40.  BP 119/82.  O2 97.5.  O2 saturation 95% on oxygen at 4 L/m  HEENT: No oral lesions.  Dentition intact.  Oral mucous membranes are dry.  No lymphadenopathy or thyromegaly  Chest:  normal  configuration.  No retractions.  Breath sounds moderately decreased bilaterally, with diffuse polyphonic expiratory wheezes.  No discernible crackles.  Few rhonchi.  C-V: Radial pulses 2+ bilaterally.  Heart sounds are distant.  Unable to discern any murmur  Abdomen: Exam limited by upright posture.  No tenderness elicited.  Extremities: No cyanosis or edema.  Neurologic: Unable to test sensation and strength due to lethargy    Chest x-ray and CT scan are reviewed.  Emphysematous changes particularly in the upper lung zones as before.  He has some scattered areas of tree-in-bud nodular infiltrates, which have been present on prior CT scans, although this is a new finding from the most recent scan of 11/26/17.  WBC 0.1.  Hemoglobin 9.9 g.  Platelets 26 K  Creatinine 0.6.  Sodium 128, potassium 4.4, chloride 93, CO2 22.  Glucose 202  Arterial pH 7.31, PCO2 47, PO2 52, O2 saturation 83%    A&P: Acute respiratory distress with mild hypercapnia and hypoxemia, secondary to acute exacerbation of COPD.  He has severe airways inflammation/bronchospasm.  Scattered nodular infiltrates on CT scan are consistent with acute airways disease.  Hypercapnia and hypoxemia do not require ventilatory support at this time.  He improves with higher oxygen flow rate.  There is concern that he may develop respiratory muscle fatigue, given the increased work of breathing he exhibits.  Ventilator support would be recommended, as this acute illness is expected to improve, and ventilator support would be a  short-term process.  Adding high-dose steroids to bronchodilator and antibiotic therapy.  Neutropenia secondary to chemotherapy.  Agree with current antibiotic coverage.  Patient's wife is updated regarding his condition and outlook.  She understands the possible need for ventilator support and agrees with this.  Discussed with Dr. Corky Sox

## 2018-01-22 NOTE — Progress Notes (Signed)
Patient noted to have slight ST segment elevation on monitor.  EKG ordered.  Will await reading.  Patient sleeping quietly, respirations are easy and unlabored at this time.

## 2018-01-22 NOTE — Oncology Nurse Navigation (Signed)
Patient admitted to [facility] via stretcher from ED for diagnosis of Pneumonia.   Patient and wife oriented to patient room including call light and bed controls.  Admission assessment completed - see admission flowsheet documentation.  Patient is a medium fall risk.  Safety measures instituted per policy.      Patient and wife oriented to unit policies and procedures including: pain management practices, unit safety precautions, family rapid response, q4h vital signs and assessments, daily 4am lab draws, weekly chest x-rays, weekly VRE rectal swabs for surveillance, daily chlorhexidine bathing, standing transfusion orders, and routine central line care.  Also discussed use of call light and how to get in touch with nursing staff.  Stressed the importance of calling out immediately for any changes in condition including but not limited to: pain, chills, fever, nausea, vomiting, diarrhea, chest pain, sob/doe, assistance with toileting, bleeding, or any other symptoms that are out of the ordinary for the patient.  Patient verbalizes understanding of all instructions and will call for assistance as needed.

## 2018-01-22 NOTE — Progress Notes (Signed)
Dr. Pearlean Brownie updated on decreased urine output today.  today at this point.  Awaiting new orders.  Will continue to monitor.

## 2018-01-22 NOTE — Progress Notes (Signed)
Patient continues to struggle to breath.  Dr. Karie Kirks updated on status, in room to assess patient.  No new orders a this time, will continue to monitor.

## 2018-01-22 NOTE — Progress Notes (Signed)
UPDATE:    Troponin x2 negative.  Pt resting comfortably in bed without any increased pain or SOB.  Remains on 4L O2 with audible expiratory wheezing which is unchanged from admission.  Breathing tx helping.  Bedside point of care ultrasound limited, but not showing any severe abnormalities.  Interventional cardiologist updated.      Will continue to treat conservatively for probable pericarditis.    Continue breathing txs.  Will monitor.   Repeat EKG / trop if pain increases.     Signed,    [place] Jahrell Hamor, MD  PGY3  Resident physician  Internal Medicine

## 2018-01-22 NOTE — H&P (Signed)
BCC History and Physical   ??  ??  Attending Physician: Harlene Salts, MD              Primary Care: Harlene Salts, MD                            ??  Name: David Watkins DOB:  1956-05-28  MRN:  [id number]  ??  Admission: 01/12/2018    ??  Date: 01/13/2018  ??  Reason for Admission: Uncontrolled Pain d/t neoplasm       HPI:    Recently discharged after DCEP    Admitted last night due to respiratory symptoms :       Patient has been having ongoing issues with shortness of breath.  He states that a couple of months ago he was diagnosed with influenza and ever since has never fully resolved.  He was recently admitted and was discharged on a decadron taper which completed yesterday, however, he was having persistent shortness of breath and was seen by oncology today and placed on prednisone.  Patient states that he used a nebulizer treatment this evening but he continues to have persistent shortness of breath.  He's also developed a pain that he states is in his chest and his neck to his maxillary sinuses.  He states he feels like it starts in his maxillary sinuses that radiates inferiorly.  He describes a tightness in his chest when he tries to take a breath.  Denies lower extremity swelling or pain.  Patient had chemotherapy on Monday and was seen on Tuesday to get Neupogen and then again evaluated today and had significant drop in white blood cell per patient's wife.      Patient in moderate to severe distress; sitting up, 90 degress in bed; unable to breath well - sats > 92          Past Medical History:   Diagnosis Date   ??? Bone pain    ??? Hyperlipidemia    ??? Leukemia, plasma cell, in relapse ([place])    ??? Low back pain    ??? Multiple myeloma ([place])    ??? Neuropathy     chemo induced, feet          Past Surgical History:   Procedure Laterality Date   ??? BONE MARROW BIOPSY     ??? BONE MARROW TRANSPLANT     ??? OTHER SURGICAL HISTORY Left 08/17/2016    trifusion  cath placement   ??? PRE-MALIGNANT / BENIGN SKIN LESION EXCISION Left 03/29/2017    EXCISE LESION LEFT EXTERNAL EAR WITH FROZEN SECTION, FULL THICKNESS SKIN GRAFT          Family History   Problem Relation Age of Onset   ??? Heart Disease Mother    ??? Lung Cancer Father    ??? Heart Attack Brother           Social History     Tobacco Use   ??? Smoking status: Former Smoker     Last attempt to quit: 11/25/2012     Years since quitting: 5.1   ??? Smokeless tobacco: Never Used   Substance Use Topics   ??? Alcohol use: Yes     Comment: socially   ??? Drug use: No          Allergies   Allergen Reactions   ??? Lenalidomide Rash   ??? Pomalidomide Hives   ??? Ceclor [Cefaclor] Hives  Current Facility-Administered Medications:   ???  atorvastatin (LIPITOR) tablet 10 mg, 10 mg, Oral, Nightly, Verne Grain., DO  ???  bisacodyl (DULCOLAX) EC tablet 10 mg, 10 mg, Oral, Daily PRN, Verne Grain., DO  ???  calcium elemental (OSCAL) tablet 500 mg, 500 mg, Oral, BID, Verne Grain., DO, 500 mg at 01/22/18 3329  ???  fentaNYL (DURAGESIC) 50 MCG/HR 1 patch, 1 patch, Transdermal, Q72H, Verne Grain., DO  ???  gabapentin (NEURONTIN) capsule 300 mg, 300 mg, Oral, TID, Verne Grain., DO, 300 mg at 01/22/18 5188  ???  ondansetron [facility]) tablet 8 mg, 8 mg, Oral, Q6H PRN, Verne Grain., DO  ???  oxyCODONE-acetaminophen (PERCOCET) 7.5-325 MG per tablet 1 tablet, 1 tablet, Oral, Q4H PRN, Verne Grain., DO, 1 tablet at 01/22/18 4166  ???  pantoprazole (PROTONIX) tablet 40 mg, 40 mg, Oral, QAM AC, Verne Grain., DO, 40 mg at 01/22/18 0630  ???  prochlorperazine (COMPAZINE) tablet 10 mg, 10 mg, Oral, Q6H PRN, Verne Grain., DO  ???  sennosides-docusate sodium (SENOKOT-S) 8.6-50 MG tablet 2 tablet, 2 tablet, Oral, Daily, Verne Grain., DO, 2 tablet at 01/22/18 1601  ???  sertraline (ZOLOFT) tablet 50 mg, 50 mg, Oral, Daily, Verne Grain., DO, 50 mg at 01/22/18 0800  ???  valACYclovir (VALTREX) tablet 500 mg, 500 mg, Oral, BID,  Verne Grain., DO, 500 mg at 01/22/18 0759  ???  meropenem (MERREM) 1 g in sodium chloride 0.9 % 100 mL IVPB (mini-bag), 1 g, Intravenous, Q8H, Verne Grain., DO, Stopped at 01/22/18 0930  ???  0.9 % sodium chloride infusion, 500 mL, Intravenous, Continuous, Verne Grain., DO, Last Rate: 50 mL/hr at 01/22/18 0501, 500 mL at 01/22/18 0501  ???  sodium chloride flush 0.9 % injection 10 mL, 10 mL, Intravenous, 2 times per day, Verne Grain., DO, 10 mL at 01/22/18 0801  ???  sodium chloride flush 0.9 % injection 10 mL, 10 mL, Intravenous, PRN, Verne Grain., DO  ???  potassium chloride 20 mEq/50 mL IVPB (Central Line), 20 mEq, Intravenous, PRN, Verne Grain., DO  ???  magnesium sulfate 4 g in 100 mL IVPB premix, 4 g, Intravenous, PRN, Verne Grain., DO  ???  magnesium hydroxide (MILK OF MAGNESIA) 400 MG/5ML suspension 30 mL, 30 mL, Oral, Daily PRN, Verne Grain., DO  ???  Saline Mouthwash 15 mL, 15 mL, Swish & Spit, 4x Daily AC & HS, Verne Grain., DO, 15 mL at 01/22/18 0932  ???  Saline Mouthwash 15 mL, 15 mL, Swish & Spit, Q4H PRN, Verne Grain., DO  ???  alteplase (CATHFLO) injection 2 mg, 2 mg, Intracatheter, PRN, Verne Grain., DO  ???  tiotropium [facility]) inhalation capsule 18 mcg, 18 mcg, Inhalation, Daily, Verne Grain., DO, 18 mcg at 01/22/18 0930  ???  budesonide (PULMICORT) nebulizer suspension 250 mcg, 0.25 mg, Nebulization, BID, 250 mcg at 01/22/18 0754 **AND** formoterol (PERFOROMIST) nebulizer solution 20 mcg, 20 mcg, Nebulization, BID, Verne Grain., DO, 20 mcg at 01/22/18 3557  ???  LORazepam (ATIVAN) tablet 1 mg, 1 mg, Oral, Q4H PRN, Verne Grain., DO, 1 mg at 01/22/18 0815  ???  morphine (PF) injection 2 mg, 2 mg, Intravenous, Q4H PRN **OR** morphine injection 4 mg, 4 mg, Intravenous, Q4H PRN, Percell Miller  A Drucilla Schmidt., DO, 4 mg at 01/22/18 1200  ???  LORazepam (ATIVAN) injection 1 mg, 1 mg, Intravenous, Q4H, Verne Grain., DO  ???  LORazepam (ATIVAN) 2 MG/ML  injection, , , ,   ???  albuterol (ACCUNEB) nebulizer solution 1.25 mg, 1.25 mg, Nebulization, Q6H PRN, Verne Grain., DO  ???  furosemide (LASIX) injection 20 mg, 20 mg, Intravenous, Once, Verne Grain., DO                          Physical Exam:     Vital Signs:  BP 123/86    Pulse 124    Temp 98.3 ??F (36.8 ??C) (Oral)    Resp 29    Wt 194 lb 10.7 oz (88.3 kg)    SpO2 95%    BMI 28.75 kg/m??     Weight:    Wt Readings from Last 3 Encounters:   01/22/18 194 lb 10.7 oz (88.3 kg)   01/18/18 194 lb 9.6 oz (88.3 kg)   01/10/18 194 lb 12.8 oz (88.4 kg)     General: Awake, alert and oriented x 3 but in distress; holding onto bed  HEENT:??normocephalic, alopecia,??PERRL, no scleral erythema or icterus, Oral mucosa moist and intact  NECK: supple without palpable adenopathy  BACK: Straight negative CVAT  SKIN:??warm dry and intact without lesions rashes or masses  CHEST:??Wheezes, crackles thru out; secondary accessory muscle use  CV: Normal S1 S2, RRR, no MRG  ABD:??NT ND normoactive BS, no palpable masses or hepatosplenomegaly  EXTREMITIES:??without edema, denies calf tenderness  NEURO: CN II - XII grossly intact  CATHETER:??Right IJ PAC (06/21/17, [place])??- CDI      Discharge Laboratory Data:  CBC:   Recent Labs     01/21/18  2228 01/22/18  0700   WBC 0.2* 0.1*   HGB 11.2* 9.9*   HCT 34.0* 29.3*   MCV 95.0 93.8   PLT 38* 26*     BMP/Mag:  Recent Labs     01/21/18  2228 01/22/18  0112 01/22/18  0700   NA 131*  --  128*   K 4.0  --  4.4   CL 96*  --  93*   CO2 22  --  22   PHOS  --   --  3.0   BUN 18  --  15   CREATININE 0.6*  --  0.6*   MG  --  1.40* 1.50*     LIVP:   Recent Labs     01/22/18  0112 01/22/18  0700   AST 16 20   ALT 45* 46*   BILIDIR 0.3  --    BILITOT 0.7 0.5   ALKPHOS 66 65     Coags:   Recent Labs     01/22/18  0700   PROTIME 15.5*   INR 1.36*     Uric Acid   Recent Labs     01/22/18  0112   LABURIC 2.5*     Diagnostics:  1. ??PET scan (01/10/18):    ??  Myeloma Labs (01/03/18):  SPEP:????reveals that M2,  previously characterized as monoclonal IgG kappa in  mid-to-slow gamma is 1.0 gm/dL, greater than the 0.2 gm/dL observed on 08 Nov 2017.??  SIFE:????Serum immunofixation electrophoresis reveals persistent M2 in mid-to-slow gamma, a monoclonal IgG kappa. ??A recent M-spike, M1, was minor monoclonal IgG lambda in mid-gamma; M1 continues to be absent.  SFLC:  Kappa:  30.80, previously (11/08/17) -??10.40  Lambda:  1.46, previously (11/08/17) -??5.97  Free Kappa Lambda Ratio:  21.10, previously (11/08/17) -??1.74  Quantitative Immunoglobulins:  IgG:??  1480, previously (11/08/17) - 552  IgA:??  <26, previously (11/08/17) -??30  IgM:??  <20  ??  PROBLEM LIST: ??????????   ????  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 ??COPD  9.????Influenza A??(10/12/17)  ????  TREATMENT: ??????????   ??  1. Rad Tx to T5-7, T10-L3, Right Scapula - 3000 cGy - Dr. Jamas Lav 03/20/16-04/01/16  2. RVD x1 03/20/16 - discontinued d/t rash   3. Velcade/Pomalyst/Dex x3 cycles 04/23/16-06/17/16 - discontinued d/t reaction to pomalyst  4. Velcade/Dex x 1 cycle 06/26/16 (last dose of dexamethasone 07/22/16  5. High-dose melphalan followed by administration of PBSCs 2.36 x10^6 cd34cells/kg on 08/28/16  6. Maintenance Revlimid 14m daily (12/2016-04/23/17)  7. Dexamethasone 469mdaily (04/24/17)  8. DCEP   Cycle #1 - 04/26/17  Cycle #2 - 05/25/17 - excellent response  9.??Dara/Velcade/Dex (C1D1 - 06/22/17) - BMBx ??10/18/17 - CR  Cycles 7-8 (21 day cycle)  - Dex 20 mg po D4,5.8,9,11 and 12  - Dara 16 mg/Kg D1 only with Dex 20 mg IV  - Velcade 1.3 mg/M2 D1,4,8 and 11  Cycle 9 and beyond (maintenance, 11/08/17??- 01/03/18)  Dara 16 mg/Kg Q 28 days  Dex 12 mg IV D1  Velcade Q 2 weeks??  10. DCEP   Cycle #1 - 01/13/18  ??  ASSESSMENT AND PLAN:??????????   ??  1. ??IgG kappa multiple myeloma / Plasma  Cell Leukemia:??Relapsed disease??  - S/p??treatment??Dara/Velcade/Dex??x 8 cycles??(06/22/17 - 10/2017). Followed by??maintenance Dara??Q4 wks/Velcade??Q2ks/Dex??(started 11/08/17)  - Now w/ progressive disease based on myeloma labs (01/03/18) & PET scan (01/10/18), see above??  ??  PLAN:??DCEP for disease and pain control. Hope for allogeneic transplant in CR2, but needs to disease response and improvement in lung function??  ??  DCEP Cycle #1, Day +10  ??  2. ??ID: Afebrile  - Valtrex and Fluconazole  - Meropenem, day #1  ??  3. Heme:??Anemia d/t chemotherapy  - Transfuse for Hgb < 7 and Platelets <??20K  ??  4.??Metabolic: Stable renal fxn and e-lytes??except for hypoNa &??steroid-induced hyperglycemia   ??  5. Pulmonary:??No acute issues, but currently on steroid taper for COPD per Dr. BlDorann Lodge - Pulm Nodule: Stable??on??CT chest ??From??04/23/17??& 10/18/17 & 11/26/17; cont to monitor.   - CTPA??(11/26/17): No PE;??resolution of??mild upper lobe tree-in-bud opacities; Mild emphysema. Two 3 mm LLL pulmonary nodules, stable  - PFT (12/27/17):????compared to??07/2016:??FVC 3.25 L, 66%, and FEV1  1.42 L, 38%. FEV1/FVC is 44%. This is unchanged from prior study. Air trapping seen on lung volumes. Diffusion capacity 3.91, 79% of predicted, down from 5.21??  - ABG; repeat CXR  - CT negative upon admission    - ativan and morphine for labored breathing  ??  6. GI/Nutrition: Appetite and oral intake is good??  - Cont PPI ppx w/ steroids??  - Cont low microbial diet ??  Constipation:  Improved  - Cont Dulcolax & Senokot-s prn (01/16/18)   ??  7. Cardiac:??Mild HTN, possibly from hypervolemia d/t IVF  - H/o HLD &??has septal wall defect on echocardiogram.  - Echo (07/13/16): abnormal (paradoxical) septal motion is present with preserved LVEF 55-60%.  - Resume Lipitor   ??  8. Psych:  Concerned about disease progression and  ability to get transplant   Anxiety/Depression: Ongoing  - Cont Zoloft??50 mg daily??  ??Insomnia:??No complaints??currently??  ??  9. Peripheral  Neuropathy:??Ongoing  - Cont Gabapentin 300 mg??TID  ??  10. 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  11.??Acute on??Chronic left lower back pain:????Improving w/ chemotherapy; d/t Neoplasm and relapsed/progressive disease??  - S/p Radiation (09/01/17 - 09/15/17,??Fried)  - Cont Fentanyl patch??50 mcg/hr (increased 01/13/18) - Rx 01/18/18  - Resume Percocet 7.5/325 mg q 4hrs prn (No Rx on d/c)      DVT Prophylaxis:  SCD's; plts < 50K    Dispostion:   Tenuous, unknown      Anecia Nusbaum A. Drucilla Schmidt, DO, MS  [facility]    Please contact via:  1.  [employer]  2.  Cell Phone:  [phone]    01/22/2018   12:32 PM

## 2018-01-22 NOTE — Progress Notes (Signed)
Patient resting in bed, still complaining of chest pressure.  ST elevation still noted on EKG.  Repeat EKG ordered by Dr. Raina Mina.  Discussed case with Dr. Truett Perna.  Patient and girlfriend updated on situation.  Remaining vitals are within normal limits.

## 2018-01-22 NOTE — Progress Notes (Signed)
Patient is very anxious this morning.  Fearful he is going to die from his current illness.  Reassured patient.  Discussed with Dr. Pearlean Brownie.

## 2018-01-22 NOTE — Significant Event (Signed)
Code STEMI called at [facility] for chest pain and EKG changes.    Pt initially admitted for ongoing chest pain and SOB, which was attributed to sequelae of influenza illness he had some months ago, but never fully recovered from.  He is being treated for COPD exacerbation.  He was placed on broad spectrum abx for coverage as he is leukopenic from ongoing chemo tx for multiple myeloma.     On arrival, pt's nurse Marcene Brawn describes that pt complained of increased chest pain, prompting and EKG that showed new ST segment elevations in ST elevations in leads II, III, avF, V4 and V5.     Pt describes his pain as similar to prior episodes and is substernal, sharp and stabbing in nature with radiation around the rib line to the left mid-axillary line.   He has reported this type of pain earlier in the day.      He was immediately given morphine and SL nitro x2.  O2 sats were mid to high 90s on 4L.  He does not appear in any distress.  ASA was withheld given the extent of severe thrombocytopenia.      Case was discussed with interventional cardiologist, Drs. Shearon Stalls and Winona Legato.  Subsequent EKGs were sent to them for evaluation.  Heme/Onc Dr. Corky Sox also contacted and briefed.  Initial troponin was negative.  EKGs were felt to represent likely pericarditis.       PLAN:   The risk/benefit assessment of cardiac cath was discussed with pt and significant other, [name], at bedside.  After extensive discussion with all parties (intervention, heme/onc, pt, family), the decision was made to treat pt for active pericarditis.  Troponins will be trended and followed closely.  Pain to be treated with home percocet as NSAIDs are contraindicated based on PLT count.  We will facilitate a point of care cardiac ultrasound to look for any abnormalities and report back to Dr. Shearon Stalls.  All parties in agreement.      Signed,    [place] Chastidy Ranker, MD  PGY3  Resident physician  Internal Medicine

## 2018-01-22 NOTE — Progress Notes (Signed)
Verbal order from Dr. Pearlean Brownie to give an additional 4mg  Morphine IVP now.

## 2018-01-22 NOTE — Progress Notes (Signed)
RESPIRATORY THERAPY ASSESSMENT    Name:  David Watkins  Medical Record Number:  [id number]  Age: 62 y.o.   Gender: male  DOB: 1956/01/02  Today's Date:  01/22/2018  Room:  3526/3526-01    Assessment     Is the patient being admitted for a COPD or Asthma exacerbation?  No   (If yes the patient will be seen every 4 hours for the first 24 hours and then reassessed)    Patient Admission Diagnosis      Allergies  Allergies   Allergen Reactions   ??? Lenalidomide Rash   ??? Pomalidomide Hives   ??? Ceclor [Cefaclor] Hives       Minimum Predicted Vital Capacity:     1100          Actual Vital Capacity:      1250              Pulmonary History:former smoker  Home Oxygen Therapy:  room air  Home Respiratory Therapy:Albuterol   Current Respiratory Therapy:  hhn pulmicort qid, hhn pulmicort bid, hhn performist bid  Treatment Type: HHN  Medications: Albuterol/Ipratropium    Respiratory Severity Index(RSI)   Patients with orders for inhalation medications, oxygen, or any therapeutic treatment modality will be placed on Respiratory Protocol.  They will be assessed with the first treatment and at least every 72 hours thereafter.  The following severity scale will be used to determine frequency of treatment intervention.    Smoking History: Pulmonary Disease or Smoking History, Greater than 15 pack year = 2    Social History  Social History     Tobacco Use   ??? Smoking status: Former Smoker     Last attempt to quit: 11/25/2012     Years since quitting: 5.1   ??? Smokeless tobacco: Never Used   Substance Use Topics   ??? Alcohol use: Yes     Comment: socially   ??? Drug use: No       Recent Surgical History: None = 0  Past Surgical History  Past Surgical History:   Procedure Laterality Date   ??? BONE MARROW BIOPSY     ??? BONE MARROW TRANSPLANT     ??? OTHER SURGICAL HISTORY Left 08/17/2016    trifusion cath placement   ??? PRE-MALIGNANT / BENIGN SKIN LESION EXCISION Left 03/29/2017    EXCISE LESION LEFT EXTERNAL EAR WITH FROZEN SECTION, FULL THICKNESS  SKIN GRAFT       Level of Consciousness: Alert, Oriented, and Cooperative = 0    Level of Activity: Walking unassisted = 0    Respiratory Pattern: Regular Pattern; RR 8-20 = 0    Breath Sounds: Diminshed bilaterally and/or crackles = 2    Sputum   ,  ,    Cough: Strong, spontaneous, non-productive = 0    Vital Signs   BP 124/84    Pulse 110    Temp 98.7 ??F (37.1 ??C) (Oral)    Resp 18    Wt 194 lb 10.7 oz (88.3 kg)    SpO2 96%    BMI 28.75 kg/m??   SPO2 (COPD values may differ): 88-89% on room air or greater than 92% on FiO2 28- 35% = 2    Peak Flow (asthma only): not applicable = 0    RSI: 0-4 = See once and convert to home regimen or discontinue        Plan       Goals: medication delivery and improve oxygenation  Patient/caregiver was educated on the proper method of use for Respiratory Care Devices:  Yes      Level of patient/caregiver understanding able to:   ? Verbalize understanding   ? Demonstrate understanding       ? Teach back        ? Needs reinforcement       ?  No available caregiver               ?  Other:     Response to education:  Excellent     Is patient being placed on Home Treatment Regimen?  Yes     Does the patient have everything they need prior to discharge?  Yes     Comments: aqdmits with pneumonia    Plan of Care: hhn albuterol qid, hhn pulmicort bid, hhn performist bid    Electronically signed by Fransico Michael, RCP on 01/22/2018 at 3:50 AM    Respiratory Protocol Guidelines     1. Assessment and treatment by Respiratory Therapy will be initiated for medication and therapeutic interventions upon initiation of aerosolized medication.  2. Physician will be contacted for respiratory rate (RR) greater than 35 breaths per minute. Therapy will be held for heart rate (HR) greater than 140 beats per minute, pending direction from physician.  3. Bronchodilators will be administered via Metered Dose Inhaler (MDI) with spacer when the following criteria are met:  a. Alert and cooperative     b. HR  < 140 bpm  c. RR < 30 bpm                d. Can demonstrate a 2-3 second inspiratory hold  4. Bronchodilators will be administered via Hand Held Nebulizer [facility]) to patients when ANY of the following criteria are met  a. Incognizant or uncooperative          b. Patients treated with HHN at Home        c. Unable to demonstrate proper use of MDI with spacer     d. RR > 30 bpm   5. Bronchodilators will be delivered via Metered Dose Inhaler (MDI), HHN, Aerogen to intubated patients on mechanical ventilation.  6. Inhalation medication orders will be delivered and/or substituted as outlined below.    Aerosolized Medications Ordering and Administration Guidelines:    1. All Medications will be ordered by a physician, and their frequency and/or modality will be adjusted as defined by the patients Respiratory Severity Index (RSI) score.  2. If the patient does not have documented COPD, consider discontinuing anticholinergics when RSI is less than 9.  3. If the bronchospasm worsens (increased RSI), then the bronchodilator frequency can be increased to a maximum of every 4 hours.  If greater than every 4 hours is required, the physician will be contacted.  4. If the bronchospasm improves, the frequency of the bronchodilator can be decreased, based on the patient's RSI, but not less than home treatment regimen frequency.  5. Bronchodilator(s) will be discontinued if patient has a RSI less than 9 and has received no scheduled or as needed treatment for 72  Hrs.    Patients Ordered on a Mucolytic Agent:    1. Must always be administered with a bronchodilator.    2. Discontinue if patient experiences worsened bronchospasm, or secretions have lessened to the point that the patient is able to clear them with a cough.    Anti-inflammatory and Combination Medications:    1. If the patient lacks prior history  of lung disease, is not using inhaled anti-inflammatory medication at home, and lacks wheezing by examination or by history for  at least 24 hours, contact physician for possible discontinuation.

## 2018-01-22 NOTE — ED Notes (Signed)
REPORT CALLED TO [facility].      Bard Herbert, RN  01/22/18 [phone]

## 2018-01-22 NOTE — Progress Notes (Signed)
Patient is in respiratory distress, respirations are shallow and tachypenic.  Patient placed on oxygen at 4 LPM per NC.  ST on monitor with a rate of 130-140.  Dr. Pearlean Brownie called to see patient, additional orders given and carried out.  Dr. Karie Kirks also in to see patient, additional orders given and carried out.  Patient continues to be tachypenic.  Dr. Pearlean Brownie in to see patient again.

## 2018-01-22 NOTE — Progress Notes (Addendum)
Nutrition Assessment    Type and Reason for Visit: Initial, Positive Nutrition Screen    Nutrition Recommendations:   1. Continue current General diet, low microbial  2. Encourage adequate PO intake; patient declined ONS  3. Please record all PO intake  4. Monitor nutrition adequacy, bowel habits, wound healing, pertinent labs, weight changes, and clinical progress    Nutrition Assessment: Patient is at nutrition risk related to reported decreased appetite as evidenced by patient report. Reports appetite has been poor since chemo started 2 weeks ago. Encouraged adequate PO intake. Discussed ONS however patient declined all at this time. Unable to complete NFPE as patient appeared anxious.    Malnutrition Assessment:   Malnutrition Status: At risk for malnutrition   Context: Acute illness or injury   Findings of the 6 clinical characteristics of malnutrition (Minimum of 2 out of 6 clinical characteristics is required to make the diagnosis of moderate or severe Protein Calorie Malnutrition based on AND/ASPEN Guidelines):  1. Energy Intake-Unable to assess(suspect inadequate PO past 2 weeks),      2. Weight Loss-No significant weight loss,    3. Fat Loss-Unable to assess(2/2 patient condition at time of visit),    4. Muscle Loss-Unable to assess,    5. Fluid Accumulation-No significant fluid accumulation,    6. Grip Strength-Not measured    Nutrition Risk Level: Moderate    Nutrient Needs:   Estimated Daily Total Kcal: 1943 - 4081(44 - 25)   Estimated Daily Protein (g): 88 - 115(1.0 - 1.2)   Estimated Daily Total Fluid (ml/day): 1943 - 2208    Nutrition Diagnosis:    Problem: Predicted suboptimal energy intake   Etiology: related to Insufficient energy/nutrient consumption    . Signs and symptoms:  as evidenced by Patient report of    Objective Information:   Nutrition-Focused Physical Findings: +BM 5/23; trace BLE edema; appears ill   Wound Type: None   Current Nutrition Therapies:   Oral Diet Orders:  General    Oral Diet intake: 76-100%(of choc pudding)   Oral Nutrition Supplement (ONS) Orders: None   ONS intake: Unable to assess   Anthropometric Measures:   Ht:  5'9"    Current Body Wt: 194 lb 10.7 oz (88.3 kg)   Usual Body Wt: (194# "about usual" per patient)   % Weight Change:  ,  Weight appears stable per epic   Ideal Body Wt: 160 lb 4.4 oz (72.7 kg), % Ideal Body     BMI Classification: BMI 25.0 - 29.9 Overweight(28.8)    Nutrition Interventions:   Continue current diet  Continued Inpatient Monitoring    Nutrition Evaluation:    Evaluation: Goals set    Goals: Patient to tolerate and consume >75% of meals      Monitoring: Meal Intake, Diet Tolerance, Weight      Electronically signed by Freddy Jaksch, RD, LD on 01/22/18 at 12:32 PM    Contact Number: [phone]

## 2018-01-23 LAB — EKG 12-LEAD
Atrial Rate: 114 {beats}/min
Atrial Rate: 110 {beats}/min
Atrial Rate: 113 {beats}/min
P Axis: 81 degrees
P Axis: 72 degrees
P Axis: 78 degrees
P-R Interval: 130 ms
P-R Interval: 136 ms
P-R Interval: 138 ms
Q-T Interval: 354 ms
Q-T Interval: 314 ms
Q-T Interval: 326 ms
QRS Duration: 82 ms
QRS Duration: 82 ms
QRS Duration: 82 ms
QTc Calculation (Bazett): 487 ms
QTc Calculation (Bazett): 430 ms
QTc Calculation (Bazett): 441 ms
R Axis: 75 degrees
R Axis: 67 degrees
R Axis: 74 degrees
T Axis: 74 degrees
T Axis: 70 degrees
T Axis: 73 degrees
Ventricular Rate: 114 {beats}/min
Ventricular Rate: 110 {beats}/min
Ventricular Rate: 113 {beats}/min

## 2018-01-23 LAB — TROPONIN: Troponin: <0.01 ng/mL (ref <0.01)

## 2018-01-23 LAB — ECHOCARDIOGRAM COMPLETE 2D W DOPPLER W COLOR: Left Ventricular Ejection Fraction: 53

## 2018-01-23 LAB — BASIC METABOLIC PANEL
Anion Gap: 14 (ref 3–16)
BUN: 13 mg/dL (ref 7–20)
CO2: 21 mmol/L (ref 21–32)
Calcium: 8.6 mg/dL (ref 8.3–10.6)
Chloride: 97 mmol/L — ABNORMAL LOW (ref 99–110)
Creatinine: 0.7 mg/dL — ABNORMAL LOW (ref 0.9–1.3)
GFR African American: >60 (ref >60)
GFR Non-African American: >60 (ref >60)
Glucose: 243 mg/dL — ABNORMAL HIGH (ref 70–99)
Potassium: 4.2 mmol/L (ref 3.5–5.1)
Sodium: 132 mmol/L — ABNORMAL LOW (ref 136–145)

## 2018-01-23 LAB — CBC WITH AUTO DIFFERENTIAL
Hematocrit: 30.4 % — ABNORMAL LOW (ref 40.5–52.5)
Hemoglobin: 10.1 g/dL — ABNORMAL LOW (ref 13.5–17.5)
MCH: 31.7 pg (ref 26.0–34.0)
MCHC: 33.3 g/dL (ref 31.0–36.0)
MCV: 95 fL (ref 80.0–100.0)
MPV: 7.1 fL (ref 5.0–10.5)
Platelets: 20 10*3/uL — CL (ref 135–450)
RBC: 3.2 M/uL — ABNORMAL LOW (ref 4.20–5.90)
RDW: 18 % — ABNORMAL HIGH (ref 12.4–15.4)
WBC: 0.1 10*3/uL — CL (ref 4.0–11.0)

## 2018-01-23 LAB — CULTURE, URINE: Urine Culture, Routine: NO GROWTH

## 2018-01-23 MED ORDER — SODIUM CHLORIDE 0.9 % IV SOLN
0.9 % | INTRAVENOUS | Status: AC
Start: 2018-01-23 — End: 2018-01-23
  Administered 2018-01-23: 12:00:00 1000

## 2018-01-23 MED FILL — VALACYCLOVIR HCL 500 MG PO TABS: 500 mg | ORAL | Qty: 1

## 2018-01-23 MED FILL — MEROPENEM 1 G IV SOLR: 1 g | INTRAVENOUS | Qty: 1

## 2018-01-23 MED FILL — LORAZEPAM 2 MG/ML IJ SOLN: 2 mg/mL | INTRAMUSCULAR | Qty: 1

## 2018-01-23 MED FILL — SOLU-MEDROL 125 MG IJ SOLR: 125 mg | INTRAMUSCULAR | Qty: 125

## 2018-01-23 MED FILL — SODIUM CHLORIDE 0.9 % IV SOLN: 0.9 % | INTRAVENOUS | Qty: 1000

## 2018-01-23 MED FILL — LEVALBUTEROL HCL 1.25 MG/0.5ML IN NEBU: 1.25 MG/0.5ML | RESPIRATORY_TRACT | Qty: 1

## 2018-01-23 MED FILL — MORPHINE SULFATE 4 MG/ML IJ SOLN: 4 mg/mL | INTRAMUSCULAR | Qty: 1

## 2018-01-23 MED FILL — PERFOROMIST 20 MCG/2ML IN NEBU: 20 MCG/2ML | RESPIRATORY_TRACT | Qty: 2

## 2018-01-23 MED FILL — FENTANYL 50 MCG/HR TD PT72: 50 ug/h | TRANSDERMAL | Qty: 1

## 2018-01-23 MED FILL — GABAPENTIN 300 MG PO CAPS: 300 mg | ORAL | Qty: 1

## 2018-01-23 MED FILL — OYSTER SHELL CALCIUM 500 MG PO TABS: 500 mg | ORAL | Qty: 1

## 2018-01-23 MED FILL — BUDESONIDE 0.25 MG/2ML IN SUSP: 0.25 MG/2ML | RESPIRATORY_TRACT | Qty: 2

## 2018-01-23 MED FILL — OXYCODONE-ACETAMINOPHEN 7.5-325 MG PO TABS: 7.5-325 mg | ORAL | Qty: 1

## 2018-01-23 MED FILL — XOPENEX CONCENTRATE 1.25 MG/0.5ML IN NEBU: 1.25 MG/0.5ML | RESPIRATORY_TRACT | Qty: 1

## 2018-01-23 MED FILL — PANTOPRAZOLE SODIUM 40 MG PO TBEC: 40 mg | ORAL | Qty: 1

## 2018-01-23 MED FILL — SENNOSIDES-DOCUSATE SODIUM 8.6-50 MG PO TABS: 8.6-50 mg | ORAL | Qty: 2

## 2018-01-23 MED FILL — SODIUM CHLORIDE 0.9 % IV SOLN: 0.9 % | INTRAVENOUS | Qty: 500

## 2018-01-23 MED FILL — LIPITOR 20 MG PO TABS: 20 mg | ORAL | Qty: 1

## 2018-01-23 MED FILL — SERTRALINE HCL 50 MG PO TABS: 50 mg | ORAL | Qty: 1

## 2018-01-23 NOTE — Progress Notes (Signed)
Called to room by patient's visitor saying he was having a hard time breathing. Upon assessment patient sitting on the side of the bed coughing up thick white mucous and very tachypnic. Patient SpO2 80% on 4L NC.  Suctioned patient and placed him on non-rebreather. Respiratory called for breathing treatment. Patient SpO2 100% after a few minutes on non-rebreather. Breathing treatment given. Patient currently resting comfortably SpO2 98% on 6L High Flow NC

## 2018-01-23 NOTE — Progress Notes (Signed)
Pt with onset of ST elevation change on EKG. Corky Sox MD notified of change. Cardiology pages with no new orders. Per Dr. Keenan Bachelor, ok to not call cardiology with onset of ST elevation. Keenan Bachelor explained that this is a normal finding when patient has acute endocarditis. Will update oncoming RN.

## 2018-01-23 NOTE — Progress Notes (Signed)
[facility]         Attending Physician: Emilee Hero, MD                Primary Care: Emilee Hero, MD                              Name: David Watkins   DOB:  14-Dec-1955    MRN:  [id number]      Date: 01/13/2018      Reason for Admission: Uncontrolled Chest Pain      Subjective:    Quite an active day yesterday - COPD exacerbation, placed on corticosteroids  ST wave changes noted on telemetry; rapid response / code called    In the end, appears to have pericarditis    Breathing is less labored this AM - but remains quite weak    There was a concern for low UO - review of I/O's this morning show that UO is improving        Allergies   Allergen Reactions   . Lenalidomide Rash   . Pomalidomide Hives   . Ceclor [Cefaclor] Hives            Current Facility-Administered Medications:   .  atorvastatin (LIPITOR) tablet 10 mg, 10 mg, Oral, Nightly, Lisabeth Devoid., DO, 10 mg at 01/22/18 2108  .  bisacodyl (DULCOLAX) EC tablet 10 mg, 10 mg, Oral, Daily PRN, Lisabeth Devoid., DO  .  calcium elemental (OSCAL) tablet 500 mg, 500 mg, Oral, BID, Lisabeth Devoid., DO, 500 mg at 01/22/18 2109  .  fentaNYL (DURAGESIC) 50 MCG/HR 1 patch, 1 patch, Transdermal, Q72H, Lisabeth Devoid., DO, 1 patch at 01/22/18 2206  .  gabapentin (NEURONTIN) capsule 300 mg, 300 mg, Oral, TID, Lisabeth Devoid., DO, 300 mg at 01/22/18 2108  .  ondansetron (ZOFRAN) tablet 8 mg, 8 mg, Oral, Q6H PRN, Lisabeth Devoid., DO  .  oxyCODONE-acetaminophen (PERCOCET) 7.5-325 MG per tablet 1 tablet, 1 tablet, Oral, Q4H PRN, Lisabeth Devoid., DO, 1 tablet at 01/22/18 0358  .  pantoprazole (PROTONIX) tablet 40 mg, 40 mg, Oral, QAM AC, Lisabeth Devoid., DO, 40 mg at 01/22/18 6578  .  prochlorperazine (COMPAZINE) tablet 10 mg, 10 mg, Oral, Q6H PRN, Lisabeth Devoid., DO  .  sennosides-docusate sodium (SENOKOT-S) 8.6-50 MG tablet 2 tablet, 2 tablet, Oral, Daily, Lisabeth Devoid.,  DO, 2 tablet at 01/22/18 0759  .  sertraline (ZOLOFT) tablet 50 mg, 50 mg, Oral, Daily, Lisabeth Devoid., DO, 50 mg at 01/22/18 0800  .  valACYclovir (VALTREX) tablet 500 mg, 500 mg, Oral, BID, Lisabeth Devoid., DO, 500 mg at 01/22/18 2108  .  meropenem (MERREM) 1 g in sodium chloride 0.9 % 100 mL IVPB (mini-bag), 1 g, Intravenous, Q8H, Lisabeth Devoid., DO, Stopped at 01/23/18 [phone]  .  sodium chloride flush 0.9 % injection 10 mL, 10 mL, Intravenous, 2 times per day, Lisabeth Devoid., DO, 10 mL at 01/22/18 0801  .  sodium chloride flush 0.9 % injection 10 mL, 10 mL, Intravenous, PRN, Lisabeth Devoid., DO  .  potassium chloride 20 mEq/50 mL IVPB [facility]), 20 mEq, Intravenous, PRN, Lisabeth Devoid., DO  .  magnesium sulfate 4 g in 100 mL IVPB premix, 4 g, Intravenous, PRN, Lisabeth Devoid., DO  .  magnesium hydroxide (MILK OF MAGNESIA) 400 MG/5ML suspension 30 mL, 30 mL, Oral, Daily PRN, Lisabeth Devoid., DO  .  Saline Mouthwash 15 mL, 15 mL, Swish & Spit, 4x Daily AC & HS, Lisabeth Devoid., DO, 15 mL at 01/22/18 1747  .  Saline Mouthwash 15 mL, 15 mL, Swish & Spit, Q4H PRN, Lisabeth Devoid., DO  .  alteplase (CATHFLO) injection 2 mg, 2 mg, Intracatheter, PRN, Lisabeth Devoid., DO  .  tiotropium [facility]) inhalation capsule 18 mcg, 18 mcg, Inhalation, Daily, Lisabeth Devoid., DO, 18 mcg at 01/23/18 [phone]  .  budesonide (PULMICORT) nebulizer suspension 250 mcg, 0.25 mg, Nebulization, BID, 250 mcg at 01/23/18 0836 **AND** formoterol (PERFOROMIST) nebulizer solution 20 mcg, 20 mcg, Nebulization, BID, Lisabeth Devoid., DO, 20 mcg at 01/23/18 [phone]  .  LORazepam (ATIVAN) tablet 1 mg, 1 mg, Oral, Q4H PRN, Lisabeth Devoid., DO, 1 mg at 01/22/18 0815  .  morphine (PF) injection 2 mg, 2 mg, Intravenous, Q4H PRN, 2 mg at 01/22/18 1907 **OR** morphine injection 4 mg, 4 mg, Intravenous, Q4H PRN, Lisabeth Devoid., DO, 4 mg at 01/23/18 0920  .  LORazepam (ATIVAN) injection 1 mg, 1 mg, Intravenous,  Q4H, Lisabeth Devoid., DO, 1 mg at 01/23/18 0555  .  levalbuterol (XOPENEX) nebulizer solution 1.25 mg, 1.25 mg, Nebulization, Q4H WA, Lisabeth Devoid., DO, 1.25 mg at 01/23/18 0836  .  methylPREDNISolone sodium (SOLU-MEDROL) injection 80 mg, 80 mg, Intravenous, Q8H, Darlyn Read, MD, 80 mg at 01/23/18 0530  .  albuterol (PROVENTIL) nebulizer solution 2.5 mg, 2.5 mg, Nebulization, Q2H PRN, Darlyn Read, MD, 2.5 mg at 01/22/18 2335  .  0.9 % sodium chloride infusion, 500 mL, Intravenous, Continuous, Lisabeth Devoid., DO                          Physical Exam:     Vital Signs:  BP 112/71   Pulse 110   Temp 97.9 F (36.6 C)   Resp 21   Wt 194 lb 10.7 oz (88.3 kg)   SpO2 97% Comment: try 3l  BMI 28.75 kg/m     Weight:    Wt Readings from Last 3 Encounters:   01/22/18 194 lb 10.7 oz (88.3 kg)   01/18/18 194 lb 9.6 oz (88.3 kg)   01/10/18 194 lb 12.8 oz (88.4 kg)     General: Awake, alert and oriented x 3 but in distress; holding onto bed  HEENT:normocephalic, alopecia,PERRL, no scleral erythema or icterus, Oral mucosa moist and intact  NECK: supple without palpable adenopathy  BACK: Straight negative CVAT  SKIN:warm dry and intact without lesions rashes or masses  CHEST:Wheezes, crackles thru out; secondary accessory muscle use  CV: Normal S1 S2, RRR, no MRG  ABD:NT ND normoactive BS, no palpable masses or hepatosplenomegaly  EXTREMITIES:without edema, denies calf tenderness  NEURO: CN II - XII grossly intact  CATHETER:Right IJ PAC (06/21/17, [facility])- CDI      Discharge Laboratory Data:  CBC:   Recent Labs     01/22/18  0700 01/22/18  1927 01/23/18  0340   WBC 0.1* 0.1* 0.1*   HGB 9.9* 10.3* 10.1*   HCT 29.3* 31.1* 30.4*   MCV 93.8 94.0 95.0   PLT 26* 21* 20*     BMP/Mag:  Recent Labs     01/22/18  0112 01/22/18  0700 01/22/18  1927 01/23/18  0340   NA  --  128* 133* 132*   K  --  4.4 4.7 4.2   CL  --  93* 95* 97*   CO2  --  22 23 21    PHOS  --  3.0  --   --    BUN  --  15 14 13     CREATININE  --  0.6* 0.7* 0.7*   MG 1.40* 1.50*  --   --      LIVP:   Recent Labs     01/22/18  0112 01/22/18  0700   AST 16 20   ALT 45* 46*   BILIDIR 0.3  --    BILITOT 0.7 0.5   ALKPHOS 66 65     Coags:   Recent Labs     01/22/18  0700   PROTIME 15.5*   INR 1.36*     Uric Acid   Recent Labs     01/22/18  0112   LABURIC 2.5*     Diagnostics:  1. PET scan (01/10/18):      Myeloma Labs (01/03/18):  SPEP:reveals that M2, previously characterized as monoclonal IgG kappa in  mid-to-slow gamma is 1.0 gm/dL, greater than the 0.2 gm/dL observed on 08 Nov 6385.  SIFE:Serum immunofixation electrophoresis reveals persistent M2 in mid-to-slow gamma, a monoclonal IgG kappa. A recent M-spike, M1, was minor monoclonal IgG lambda in mid-gamma; M1 continues to be absent.  SFLC:  Kappa:  30.80, previously (11/08/17) -10.40  Lambda:  1.46, previously (11/08/17) -5.97  Free Kappa Lambda Ratio:  21.10, previously (11/08/17) -1.74  Quantitative Immunoglobulins:  IgG:  1480, previously (11/08/17) - 552  IgA:  <26, previously (11/08/17) -30  IgM:  <20    PROBLEM LIST:      1.IgG Kappa MultipleMyeloma/Plasma Cell Leukemia (Dx8/2018)  2.Peripheral neuropathy  3. Anxiety/Depression  4.Hyperlipidemia  5.Hypertension  6.Insomnia  7.Chronic lowback paind/t neoplasm (h/o lytic lesions&cord compression @T6  & T11)  8. COPD  9.Influenza A(10/12/17)    TREATMENT:      1. Rad Tx to T5-7, T10-L3, Right Scapula - 3000 cGy - Dr. Bonita Quin 03/20/16-04/01/16  2. RVD x1 03/20/16 - discontinued d/t rash   3. Velcade/Pomalyst/Dex x3 cycles 04/23/16-06/17/16 - discontinued d/t reaction to pomalyst  4. Velcade/Dex x 1 cycle 06/26/16 (last dose of dexamethasone 07/22/16  5. High-dose melphalan followed by administration of PBSCs 2.36 x10^6 cd34cells/kg on 08/28/16  6. Maintenance Revlimid 10mg  daily (12/2016-04/23/17)  7. Dexamethasone 40mg  daily (04/24/17)  8. DCEP   Cycle #1 - 04/26/17  Cycle #2 - 05/25/17 - excellent  response  9.Dara/Velcade/Dex (C1D1 - 06/22/17) - BMBx 10/18/17 - CR  Cycles 7-8 (21 day cycle)  - Dex 20 mg po D4,5.8,9,11 and 12  - Dara 16 mg/Kg D1 only with Dex 20 mg IV  - Velcade 1.3 mg/M2 D1,4,8 and 11  Cycle 9 and beyond (maintenance, 11/08/17- 01/03/18)  Dara 16 mg/Kg Q 28 days  Dex 12 mg IV D1  Velcade Q 2 weeks  10. DCEP   Cycle #1 - 01/13/18    ASSESSMENT AND PLAN:     1. IgG kappa multiple myeloma / Plasma Cell Leukemia:Relapsed disease  - S/ptreatmentDara/Velcade/Dexx 8 cycles(06/22/17 - 10/2017). Followed bymaintenance DaraQ4 wks/VelcadeQ2ks/Dex(started 11/08/17)  - Now w/ progressive disease based on myeloma labs (01/03/18) & PET scan (01/10/18), see above    PLAN:DCEP for disease and pain control. Hope for allogeneic transplant in CR2, but needs to disease response and  improvement in lung function    DCEP Cycle #1, Day +11    2. ID: Afebrile  - Valtrex and Fluconazole  - Meropenem, day #2    3. Heme:Anemia d/t chemotherapy  - Transfuse for Hgb < 7 and Platelets <20K    4.Metabolic: Stable renal fxn and e-lytesexcept for hypoNa &steroid-induced hyperglycemia     5. Pulmonary:No acute issues, but currently on steroid taper for COPD per Dr. Karie Kirks   - Pulm Nodule: StableonCT chest From8/24/18& 10/18/17 & 11/26/17; cont to monitor.   - CTPA(11/26/17): No PE;resolution ofmild upper lobe tree-in-bud opacities; Mild emphysema. Two 3 mm LLL pulmonary nodules, stable  - PFT (12/27/17):compared to11/2017:FVC 3.25 L, 66%, and FEV1  1.42 L, 38%. FEV1/FVC is 44%. This is unchanged from prior study. Air trapping seen on lung volumes. Diffusion capacity 3.91, 79% of predicted, down from 5.21  - ABG; repeat CXR  - CT negative upon admission    - solumedrol    6. GI/Nutrition: Appetite and oral intake is good  - Cont PPI ppx w/ steroids  - Cont low microbial diet   Constipation:  Improved  - Cont Dulcolax & Senokot-s prn (01/16/18)     7. Cardiac:Mild HTN, possibly from  hypervolemia d/t IVF  - H/o HLD &has septal wall defect on echocardiogram.  - Echo (07/13/16): abnormal (paradoxical) septal motion is present with preserved LVEF 55-60%.  - Resume Lipitor     8. Psych:  Concerned about disease progression and ability to get transplant   Anxiety/Depression: Ongoing  - Cont Zoloft50 mg daily  Insomnia:No complaintscurrently    9. Peripheral Neuropathy:Ongoing  - Cont Gabapentin 300 mgTID    10. Bone Health:No acute fx   -H/o spinal cord compression & diffuse lytic lesions t/o skeleton  - CT Chest (04/23/17) - multiple lytic lesions throughout the skeleton   - Cont Ca/Vit D &Zometa monthly (given5/6/19, next due6/3/19)    11.Acute onChronic left lower back pain:Improving w/ chemotherapy; d/t Neoplasm and relapsed/progressive disease  - S/p Radiation (09/01/17 - 09/15/17,Fried)  - Cont Fentanyl patch50 mcg/hr (increased 01/13/18) - Rx 01/18/18  - Resume Percocet 7.5/325 mg q 4hrs prn (No Rx on d/c)      DVT Prophylaxis:  SCD's; plts < 50K    Dispostion:   Tenuous, unknown      Dollene Mallery A. Nanci Pina, DO, MS  [facility]    Please contact via:  1.  Perfect Serve  2.  Cell Phone:  [phone]    01/23/2018   9:55 AM

## 2018-01-23 NOTE — Progress Notes (Signed)
CC:  COPD exacerbation    Episode of chest pain last night triggered further evaluation with ECG and troponin studies.  End result was discovery of findings of pericarditis, no indication of coronary ischemia.  Shortness of breath has improved some.  Nonproductive cough.  Afebrile  WBC 0.1.  Hemoglobin stable 10.1 g, platelets 20 K  Empirically on meropenem for neutropenia, associated with chemotherapy  BP 106/72  Mild sinus tachycardia  Heart sounds distant  O2 saturation 96% on O2 at 3 L/m  Slightly improved breath sound quality.  Diffuse polyphonic wheezes.  No crackles.  Abdomen soft, no tenderness.  No peripheral edema.  Fluid balance slightly positive by I/O, but large insensible loss via respiratory system not counted  Creatinine stable 0.7.  Sodium 133, potassium 4.2, chloride 97, CO2 21.  Glucose 243    A&P: Severe COPD with severe exacerbation of airways disease.  Airways inflammation remains quite significant.  He is moving air more readily but progress is slow.  Continuing high-dose steroids, bronchodilators.  Hypoxemia compensated with low-flow oxygen  Neutropenia, severe.  Antibiotic coverage with meropenem.  No clear sign of infection source  Pericarditis, unclear origin, would suspect viral disease.  Multiple myeloma, on active treatment for progressive disease  Discussed with Dr. Bunnie Philips

## 2018-01-23 NOTE — Consults (Addendum)
History from chart, patient, and significant other.    62 y.o. man admitted with severe SOB. Has h/o multiple myeloma, hyperipidemia, and COPD.   Presented couple days prior to admission with sob and chest discomfort. Was started on prednisone, took a dose but felt worse so went to the hospital.  Yesterday, had had chest tightness/pain and was noted by RN to have ST abnl on tele monitor. ECG initially suggested acute inferior MI, and cath lab activated. ECG evolved into what appeared to be acute pericarditis, and Tns came back neg (despite hours, if not longer) of symptoms. Given blood counts, likely pericarditis, severe lung disease, and myeloma not in remission, cath was called off.    This morning, he is lethargic/sleepy but arousable. Denied chest pain except when sitting up and changing position. He is sob, but there is no sig change.    Has had nausea. No synocpe. SOB all the time (flat or upright).    Past Medical History:   Diagnosis Date   ??? Bone pain    ??? Hyperlipidemia    ??? Leukemia, plasma cell, in relapse ([place])    ??? Low back pain    ??? Multiple myeloma ([place])    ??? Neuropathy     chemo induced, feet     Past Surgical History:   Procedure Laterality Date   ??? BONE MARROW BIOPSY     ??? BONE MARROW TRANSPLANT     ??? OTHER SURGICAL HISTORY Left 08/17/2016    trifusion cath placement   ??? PRE-MALIGNANT / BENIGN SKIN LESION EXCISION Left 03/29/2017    EXCISE LESION LEFT EXTERNAL EAR WITH FROZEN SECTION, FULL THICKNESS SKIN GRAFT     Social History     Tobacco Use   ??? Smoking status: Former Smoker     Last attempt to quit: 11/25/2012     Years since quitting: 5.1   ??? Smokeless tobacco: Never Used   Substance Use Topics   ??? Alcohol use: Yes     Comment: socially   ??? Drug use: No     Allergies   Allergen Reactions   ??? Lenalidomide Rash   ??? Pomalidomide Hives   ??? Ceclor [Cefaclor] Hives     Family History   Problem Relation Age of Onset   ??? Heart Disease Mother    ??? Lung Cancer Father    ??? Heart Attack Brother       Review of Systems   General: No fevers, chills, fatigue, or night sweats.    HEENT: No blurry or decreased vision.    Cardiovascular: See HPI. No cramping in legs or buttocks when walking.   Respiratory: No cough, hemoptysis, or wheezing. No history of asthma.   Gastrointestinal: No abdominal pain, hematochezia, melana, or history of GI ulcers.   Genito-Urinary: No dysuria or hematuria.    Musculoskeletal: + bone/joint pain.   Neurological: No prior tia or stroke.   Psychological: + anxiety  Hematological and Lymphatic: No abnormal bleeding or bruising, blood clots, jaundice.   Endocrine: No malaise/lethargy, palpitations, polydipsia/polyuria, temperature intolerance or unexpected weight changes.   Skin: No rashes or non-healing ulcers.    PE:  Blood pressure 112/71, pulse 110, temperature 97.9 ??F (36.6 ??C), resp. rate 21, weight 194 lb 10.7 oz (88.3 kg), SpO2 100 %.  General (appearance):  Alert and cooperative. Lethargic but responsive  Eyes: Anicteric  Neck: Supple. No JVD  Ears/Nose/Mouth/Thorat: No cyanosis  CV: Regular tachycardia. No obvious m/r/g  Respiratory:  Wheezing, tachypnic. Some  use of accessory m.  GI: Abd s/nt/nd. No peritoneal signs  Skin: Warm, dry. No rashes  Neuro/Psych: Alert and oriented x 3. Appropriate behavior  Ext:  No c/c. No pitting edema  Pulses:  2+ radial    Lab Results   Component Value Date    WBC 0.1 (LL) 01/23/2018    HGB 10.1 (L) 01/23/2018    HCT 30.4 (L) 01/23/2018    MCV 95.0 01/23/2018    PLT 20 (LL) 01/23/2018     Lab Results   Component Value Date    NA 132 (L) 01/23/2018    K 4.2 01/23/2018    CL 97 (L) 01/23/2018    CO2 21 01/23/2018    BUN 13 01/23/2018    CREATININE 0.7 (L) 01/23/2018    GLUCOSE 243 (H) 01/23/2018    CALCIUM 8.6 01/23/2018    PROT 6.3 (L) 01/22/2018    LABALBU 3.2 (L) 01/22/2018    BILITOT 0.5 01/22/2018    ALKPHOS 65 01/22/2018    AST 20 01/22/2018    ALT 46 (H) 01/22/2018    LABGLOM >60 01/23/2018    GFRAA >60 01/23/2018    AGRATIO 1.0 (L)  01/22/2018    GLOB 3.1 01/22/2018     Lab Results   Component Value Date    INR 1.36 (H) 01/22/2018    INR 1.08 01/17/2018    INR 1.09 01/13/2018    PROTIME 15.5 (H) 01/22/2018    PROTIME 12.3 01/17/2018    PROTIME 12.4 01/13/2018     Lab Results   Component Value Date    TROPONINI <0.01 01/22/2018       01/22/18 ECGs reviewed. Earlier ecg seemed to demonstrate acute inferior MI but Tns negative and further ecgs showed evolution other ST segments suggesting acute pericarditis.    01/21/2018 CTA Chest: No PE. Tree-in-bud bilateral opacities suggesting atypical infection/inflammation. Enlarged L axillary notes unchanged. Ribs, spine, and scapula lesions suggesting MM/bony mets. No sig pericardial effusion noted.    01/22/18 CXR: Hyperinflated lungs. No definite acute consolidation    A/P:  62 y.o. admitted with severe SOB.  -Acute pericarditis  -Multiple myeloma  -COPD  -Neutropenia  -Pneumonia with "tree in bud" appearance of CT chest  -Hyperlipidemia    Recs:  -Echo today  -Getting steroids  -Colchicine bid if ok. Plt count is 20 so would prob avoid aggressive NSAIDs. Steroids, for copd, may help but relapse may be high (of pericarditis). Could do steroid dosing and add on the colchicine if needed or as steroids tapered off.  -No plan for intervention unless there is a significant pericardial effusion.  -Keep on dryer side of things. Pulmonary team following.    Roderic Palau A. Bunnie Philips, MD, [facility], [facility]

## 2018-01-24 DIAGNOSIS — J441 Chronic obstructive pulmonary disease with (acute) exacerbation: Secondary | ICD-10-CM

## 2018-01-24 LAB — BASIC METABOLIC PANEL
Anion Gap: 12 (ref 3–16)
BUN: 21 mg/dL — ABNORMAL HIGH (ref 7–20)
CO2: 23 mmol/L (ref 21–32)
Calcium: 8.9 mg/dL (ref 8.3–10.6)
Chloride: 101 mmol/L (ref 99–110)
Creatinine: 0.6 mg/dL — ABNORMAL LOW (ref 0.9–1.3)
GFR African American: >60 (ref >60)
GFR Non-African American: >60 (ref >60)
Glucose: 191 mg/dL — ABNORMAL HIGH (ref 70–99)
Potassium: 4.5 mmol/L (ref 3.5–5.1)
Sodium: 136 mmol/L (ref 136–145)

## 2018-01-24 LAB — CBC WITH AUTO DIFFERENTIAL
Hematocrit: 26.7 % — ABNORMAL LOW (ref 40.5–52.5)
Hemoglobin: 9 g/dL — ABNORMAL LOW (ref 13.5–17.5)
MCH: 31.4 pg (ref 26.0–34.0)
MCHC: 33.6 g/dL (ref 31.0–36.0)
MCV: 93.3 fL (ref 80.0–100.0)
MPV: 8.8 fL (ref 5.0–10.5)
Platelets: 18 10*3/uL — CL (ref 135–450)
RBC: 2.87 M/uL — ABNORMAL LOW (ref 4.20–5.90)
RDW: 18 % — ABNORMAL HIGH (ref 12.4–15.4)
WBC: 0.1 10*3/uL — CL (ref 4.0–11.0)

## 2018-01-24 LAB — CULTURE, THROAT: Throat Culture: NORMAL

## 2018-01-24 MED ORDER — LORAZEPAM 2 MG/ML IJ SOLN
2 MG/ML | Freq: Four times a day (QID) | INTRAMUSCULAR | Status: DC | PRN
Start: 2018-01-24 — End: 2018-02-08
  Administered 2018-01-25 (×2): 1 mg via INTRAVENOUS

## 2018-01-24 MED ORDER — SODIUM CHLORIDE 0.9 % IV BOLUS
0.9 % | Freq: Once | INTRAVENOUS | Status: AC
Start: 2018-01-24 — End: 2018-01-24

## 2018-01-24 MED ORDER — DILTIAZEM HCL 125 MG/25ML IV SOLN
125 MG/25ML | INTRAVENOUS | Status: DC
Start: 2018-01-24 — End: 2018-01-25
  Administered 2018-01-24: 16:00:00 5 mg/h via INTRAVENOUS
  Administered 2018-01-25 (×2): 15 mg/h via INTRAVENOUS

## 2018-01-24 MED ORDER — DILTIAZEM HCL 25 MG/5ML IV SOLN
25 MG/5ML | Freq: Once | INTRAVENOUS | Status: AC
Start: 2018-01-24 — End: 2018-01-24
  Administered 2018-01-24: 16:00:00 10 mg via INTRAVENOUS

## 2018-01-24 MED ORDER — METOPROLOL TARTRATE 25 MG PO TABS
25 MG | Freq: Two times a day (BID) | ORAL | Status: DC
Start: 2018-01-24 — End: 2018-02-02
  Administered 2018-01-25 – 2018-02-02 (×17): 25 mg via ORAL

## 2018-01-24 MED ORDER — ALBUTEROL SULFATE (2.5 MG/3ML) 0.083% IN NEBU
RESPIRATORY_TRACT | Status: DC | PRN
Start: 2018-01-24 — End: 2018-02-08
  Administered 2018-02-01 – 2018-02-03 (×2): 2.5 mg via RESPIRATORY_TRACT

## 2018-01-24 MED ORDER — FLUCONAZOLE 200 MG PO TABS
200 MG | Freq: Every day | ORAL | Status: DC
Start: 2018-01-24 — End: 2018-01-25
  Administered 2018-01-24 – 2018-01-25 (×2): 200 mg via ORAL

## 2018-01-24 MED ORDER — SODIUM CHLORIDE 0.9 % IV SOLN
0.9 % | INTRAVENOUS | Status: DC
Start: 2018-01-24 — End: 2018-02-08
  Administered 2018-01-24 – 2018-02-05 (×13): via INTRAVENOUS

## 2018-01-24 MED ORDER — SODIUM CHLORIDE 0.9 % IV SOLN
0.9 % | INTRAVENOUS | Status: AC
Start: 2018-01-24 — End: 2018-01-24
  Administered 2018-01-24: 14:00:00 1000 via INTRAVENOUS

## 2018-01-24 MED ORDER — METOPROLOL TARTRATE 25 MG PO TABS
25 MG | Freq: Once | ORAL | Status: AC
Start: 2018-01-24 — End: 2018-01-24
  Administered 2018-01-24: 21:00:00 25 mg via ORAL

## 2018-01-24 MED FILL — FLUCONAZOLE 200 MG PO TABS: 200 mg | ORAL | Qty: 1

## 2018-01-24 MED FILL — LORAZEPAM 2 MG/ML IJ SOLN: 2 mg/mL | INTRAMUSCULAR | Qty: 1

## 2018-01-24 MED FILL — PANTOPRAZOLE SODIUM 40 MG PO TBEC: 40 mg | ORAL | Qty: 1

## 2018-01-24 MED FILL — MORPHINE SULFATE 4 MG/ML IJ SOLN: 4 mg/mL | INTRAMUSCULAR | Qty: 1

## 2018-01-24 MED FILL — MEROPENEM 1 G IV SOLR: 1 g | INTRAVENOUS | Qty: 1

## 2018-01-24 MED FILL — BUDESONIDE 0.25 MG/2ML IN SUSP: 0.25 MG/2ML | RESPIRATORY_TRACT | Qty: 2

## 2018-01-24 MED FILL — SODIUM CHLORIDE 0.9 % IV SOLN: 0.9 % | INTRAVENOUS | Qty: 1000

## 2018-01-24 MED FILL — SODIUM CHLORIDE 0.9 % IV SOLN: 0.9 % | INTRAVENOUS | Qty: 500

## 2018-01-24 MED FILL — GABAPENTIN 300 MG PO CAPS: 300 mg | ORAL | Qty: 1

## 2018-01-24 MED FILL — DILTIAZEM HCL 50 MG/10ML IV SOLN: 50 MG/10ML | INTRAVENOUS | Qty: 25

## 2018-01-24 MED FILL — ALBUTEROL SULFATE (2.5 MG/3ML) 0.083% IN NEBU: RESPIRATORY_TRACT | Qty: 3

## 2018-01-24 MED FILL — VALACYCLOVIR HCL 500 MG PO TABS: 500 mg | ORAL | Qty: 1

## 2018-01-24 MED FILL — SERTRALINE HCL 50 MG PO TABS: 50 mg | ORAL | Qty: 1

## 2018-01-24 MED FILL — OXYCODONE-ACETAMINOPHEN 7.5-325 MG PO TABS: 7.5-325 mg | ORAL | Qty: 1

## 2018-01-24 MED FILL — METOPROLOL TARTRATE 25 MG PO TABS: 25 mg | ORAL | Qty: 1

## 2018-01-24 MED FILL — DILTIAZEM HCL 25 MG/5ML IV SOLN: 25 MG/5ML | INTRAVENOUS | Qty: 2

## 2018-01-24 MED FILL — OYSTER SHELL CALCIUM 500 MG PO TABS: 500 mg | ORAL | Qty: 1

## 2018-01-24 MED FILL — LEVALBUTEROL HCL 1.25 MG/0.5ML IN NEBU: 1.25 MG/0.5ML | RESPIRATORY_TRACT | Qty: 1

## 2018-01-24 MED FILL — SOLU-MEDROL 125 MG IJ SOLR: 125 mg | INTRAMUSCULAR | Qty: 125

## 2018-01-24 MED FILL — MORPHINE SULFATE 2 MG/ML IJ SOLN: 2 mg/mL | INTRAMUSCULAR | Qty: 1

## 2018-01-24 MED FILL — SENNOSIDES-DOCUSATE SODIUM 8.6-50 MG PO TABS: 8.6-50 mg | ORAL | Qty: 2

## 2018-01-24 NOTE — Plan of Care (Signed)
Problem: Falls - Risk of:  Goal: Will remain free from falls  Description  Will remain free from falls  Outcome: Ongoing  Note:   Pt is a High fall risk. See Lattie Corns Fall Score and ABCDS Injury Risk assessments. Explained fall risk precautions to pt and family and rationale behind their use to keep the patient safe. Pt bed is in low position, side rails up, call light and belongings are in reach. Fall wristband applied and present on pts wrist.  Bed alarm on.  Pt encouraged to call for assistance. Will continue with hourly rounds for PO intake, pain needs, toileting and repositioning as needed.    Goal: Absence of physical injury  Description  Absence of physical injury  Outcome: Ongoing     Problem: Pain:  Goal: Pain level will decrease  Description  Pain level will decrease  Outcome: Ongoing  Note:   Pt complaining of chest/flank pain. Receiving morphine and percocet as needed for pain.   Goal: Control of acute pain  Description  Control of acute pain  Outcome: Ongoing  Goal: Control of chronic pain  Description  Control of chronic pain  Outcome: Ongoing     Problem: Nutrition  Goal: Optimal nutrition therapy  Outcome: Ongoing

## 2018-01-24 NOTE — Progress Notes (Signed)
Came in to assess pt this AM. Pt VSS. RN assisted patient into the chair. Not 5 minutes after transfer patients HR jumped into the 160s. Order placed for stat EKG. Vagal maneuvers attempted. Cardiology paged. Per MD Keenan Bachelor RN should given 1 liter at 200 an hour and then return to normal fluids. Will continue to monitor.

## 2018-01-24 NOTE — Progress Notes (Signed)
Pt HR still elevated in the 130-150 range. Diltiazem gtt tirated to 10 mL/hr

## 2018-01-24 NOTE — Progress Notes (Signed)
RN gave 5 mg Diltiazem bolus and started diltiazem gtt at 5 ml/hr. VSS. Will continue to monitor and manage as needed.

## 2018-01-24 NOTE — Progress Notes (Signed)
Pt continues to be in what appears to be atrial fibrillation on telemetry. Heart rate is still elevated 140s-120s. MD Keenan Bachelor updated on patient status and diltiazem gtt. MD Keenan Bachelor ordered pt be given 25 mg metoprolol PO BID. With first dose to be given now. RN verified that next dose would be tonight at 2100. MD Keenan Bachelor agreed. Will continue to monitor.

## 2018-01-24 NOTE — Progress Notes (Signed)
Internal Medicine PGY- 1 Resident Progress Note  Pulmonology        PCP: Harlene Salts, MD    Date of Admission: 01/21/2018    Chief Complaint: chest pain     Subjective: patient was seen at bedside this morning. Breathing is subjectively improving. Chest pain is residual, worse with cough and deep inspiration. Was up out of bed and became very tachycardic. Did seem dry was responding to one liter bolus of normal saline. Cough was still productive.         Medications:  Reviewed    Infusion Medications   ??? sodium chloride 500 mL (01/24/18 0409)     Scheduled Medications   ??? sodium chloride  1,000 mL Intravenous Once   ??? atorvastatin  10 mg Oral Nightly   ??? calcium elemental  500 mg Oral BID   ??? fentaNYL  1 patch Transdermal Q72H   ??? gabapentin  300 mg Oral TID   ??? pantoprazole  40 mg Oral QAM AC   ??? sennosides-docusate sodium  2 tablet Oral Daily   ??? sertraline  50 mg Oral Daily   ??? valACYclovir  500 mg Oral BID   ??? meropenem  1 g Intravenous Q8H   ??? sodium chloride flush  10 mL Intravenous 2 times per day   ??? Saline Mouthwash  15 mL Swish & Spit 4x Daily AC & HS   ??? tiotropium  18 mcg Inhalation Daily   ??? budesonide  0.25 mg Nebulization BID    And   ??? formoterol  20 mcg Nebulization BID   ??? LORazepam  1 mg Intravenous Q4H   ??? levalbuterol  1.25 mg Nebulization Q4H WA   ??? methylPREDNISolone  80 mg Intravenous Q8H     PRN Meds: bisacodyl, ondansetron, oxyCODONE-acetaminophen, prochlorperazine, sodium chloride flush, potassium chloride, magnesium sulfate, magnesium hydroxide, Saline Mouthwash, alteplase, LORazepam, morphine **OR** morphine, albuterol      Intake/Output Summary (Last 24 hours) at 01/24/2018 0959  Last data filed at 01/24/2018 0915  Gross per 24 hour   Intake 1853 ml   Output 1320 ml   Net 533 ml       Physical Exam Performed:    BP (!) 135/114    Pulse 163    Temp 98.4 ??F (36.9 ??C) (Oral)    Resp 23    Wt 194 lb 10.7 oz (88.3 kg)    SpO2 93%    BMI 28.75 kg/m??     Physical Exam   Constitutional: He is  oriented to person, place, and time. He appears well-developed.   Was coughing, seemed fatigued and ill appearing.    HENT:   Head: Normocephalic and atraumatic.   Eyes: Pupils are equal, round, and reactive to light. Conjunctivae and EOM are normal.   Neck: Normal range of motion. Neck supple.   Cardiovascular: Normal heart sounds and intact distal pulses.   tachycardia with normal rhythm    Pulmonary/Chest:   Decreased air intake. Diffuse rales with slight inspiratory wheezing   Musculoskeletal: He exhibits no edema.   Neurological: He is alert and oriented to person, place, and time.   Skin: Skin is warm and dry. No rash noted. No erythema.   Psychiatric: He has a normal mood and affect.           Labs:   Recent Labs     01/22/18  1927 01/23/18  0340 01/24/18  0410   WBC 0.1* 0.1* 0.1*   HGB 10.3* 10.1*  9.0*   HCT 31.1* 30.4* 26.7*   PLT 21* 20* 18*     Recent Labs     01/22/18  0700 01/22/18  1927 01/23/18  0340 01/24/18  0410   NA 128* 133* 132* 136   K 4.4 4.7 4.2 4.5   CL 93* 95* 97* 101   CO2 22 23 21 23    BUN 15 14 13  21*   CREATININE 0.6* 0.7* 0.7* 0.6*   CALCIUM 8.2* 8.4 8.6 8.9   PHOS 3.0  --   --   --      Recent Labs     01/22/18  0112 01/22/18  0700   AST 16 20   ALT 45* 46*   BILIDIR 0.3  --    BILITOT 0.7 0.5   ALKPHOS 66 65     Recent Labs     01/22/18  0700   INR 1.36*     Recent Labs     01/21/18  2228 01/22/18  1927 01/22/18  2140   TROPONINI <0.01 <0.01 <0.01       Urinalysis:      Lab Results   Component Value Date    NITRU Negative 01/22/2018    WBCUA 0-2 01/22/2018    BACTERIA Rare 01/22/2018    RBCUA 0-2 01/22/2018    BLOODU TRACE-INTACT 01/22/2018    SPECGRAV 1.015 01/22/2018    GLUCOSEU Negative 01/22/2018       Radiology:  XR CHEST PORTABLE   Final Result      No acute pulmonary pathology or change from the prior study                  CTA PULMONARY W CONTRAST   Final Result   1.  No pulmonary embolism.   2.  Patchy groundglass opacities and tree-in-bud are seen throughout the    lungs  which are new compared to prior examination.  This most likely    represents atypical infection/inflammation.  Clinical correlation is    recommended.   3.  Paraseptal and centrilobular emphysematous change of the lungs.   4.  Enlarged left axillary lymph nodes are not significantly changed    compared to PET/CT on 01/10/2018.  This is worrisome for nodal metastasis.   5.  Scattered lucent lesions are seen throughout the ribs, spine, and    scapula.  Findings may represent multiple myeloma or osseous metastasis.          XR CHEST STANDARD (2 VW)   Final Result      No acute cardiopulmonary findings.              Assessment/Plan:    COPD exacerbation with possible superimposed pneumonia.   - groundglass opacities and tree-in-bud are seen throughout the lungs   - history of multiple exacerbations in the past, shortness of breath at baseline   - Increased wheezing with good air movement   - Afebrile overnight, one liter of oxygen requirement is decreasing, leukopenia white count of 0.1   - continue budesonide and formoterol   - continue levalbuterol   - spriva 18 mcg overnight   - solumedrol 80 mg IV TID for advanced inflammation of airways   - meropenem day 3   - pumonary nodule stable    - PFT (12/27/17):  compared to 07/2016: FVC 3.25 L, 66%, and FEV1  1.42 L, 38%. FEV1/FVC is 44%.    Atrial fibrillation, RVR, new onset   Hemodynamically stable  Treating with  IV Cardizem    Diet: DIET GENERAL;  Code Status: Full Code    PT/OT Eval Status: none     Dispo - [facility]     Rae Mar, MD    I will discuss the patient with Dr. Orville Govern, MD  Internal Medicine Resident PGY- 1   Patient examined, findings as discussed with Dr. Fransisca Connors.  Agree with assessment and plan as edited above.  Continuing aggressive treatment for exacerbation of airways disease, which is improving very slowly.  Atrial fibrillation consequence of COPD, endeavored to achieve rate control

## 2018-01-24 NOTE — Plan of Care (Signed)
Problem: Falls - Risk of:  Goal: Will remain free from falls  Description  Will remain free from falls  01/24/2018 1109 by Anastasia Fiedler, RN  Outcome: Ongoing  Pt is a High fall risk. See Lattie Corns Fall Score and ABCDS Injury Risk assessments. Explained fall risk precautions to pt and family and rationale behind their use to keep the patient safe. Pt bed is in low position, side rails up, call light and belongings are in reach. Fall wristband applied and present on pts wrist.  Bed alarm on.  Pt encouraged to call for assistance. Will continue with hourly rounds for PO intake, pain needs, toileting and repositioning as needed.     Problem: Pain:  Goal: Pain level will decrease  Description  Pain level will decrease  01/24/2018 1109 by Anastasia Fiedler, RN  Outcome: Ongoing  Pt complaining of left flank/chest pain due to pericarditis. See flowsheets for details. PRN pain medication given per the eMAR. Pt resting quietly most of the shift. Will continue to monitor and manage as needed.      Problem: Nutrition  Goal: Optimal nutrition therapy  01/24/2018 1109 by Anastasia Fiedler, RN  Outcome: Ongoing  Pt attempting to eat at each meal. Appetite improving. Will continue to monitor.      Problem: Cardiac:  Goal: Ability to maintain vital signs within normal range will improve  Description  Ability to maintain vital signs within normal range will improve  Outcome: Ongoing  Pt HR jumped into 170's this shift; appears to be in atrial fibrillation. EKG completed; see results. Cardiology updated. NS Bolus given per MD Harrison Mons order. Diltiazem gtt started per MD Bloomer. Vital signs monitored. Will continue to monitor and manage as needed.      Problem: Bleeding:  Goal: Will show no signs and symptoms of excessive bleeding  Description  Will show no signs and symptoms of excessive bleeding  Outcome: Ongoing  Patient's hemoglobin this AM:   Recent Labs     01/24/18  0410   HGB 9.0*     Patient's platelet count this AM:   Recent Labs      01/24/18  0410   PLT 18*    Thrombocytopenia Precautions in place.  Patient showing no signs or symptoms of active bleeding.  Transfusion not indicated at this time.  Patient verbalizes understanding of all instructions. Will continue to assess and implement POC. Call light within reach and hourly rounding in place. ;     Problem: Breathing Pattern - Ineffective:  Goal: Ability to achieve and maintain a regular respiratory rate will improve  Description  Ability to achieve and maintain a regular respiratory rate will improve  Outcome: Ongoing   Pt with SOB, tachypnea and dyspnea with exertion. Pt's lungs sound wheezy and rhonchorous. Pt on 1 L NC throughout shift. O2 sats kept above 92%.  Pt with strong productive cough. Will continue to monitor.

## 2018-01-24 NOTE — Progress Notes (Signed)
[facility]         Attending Physician: Emilee Hero, MD                Primary Care: Emilee Hero, MD                              Name: David Watkins   DOB:  04-04-1956    MRN:  [id number]      Date: 01/13/2018      Reason for Admission: Uncontrolled Chest Pain      Subjective:    Quite an active day Saturday - COPD exacerbation, placed on corticosteroids  ST wave changes noted on telemetry; rapid response / code called    In the end, appears to have pericarditis    Breathing is less labored this AM - but remains quite weak    There was a concern for low UO on Saturday - review of I/O's this morning show that UO is improving on Sunday and now, Monday; received a liter bolus    Up in chair today - appears weak; but very appropriate when directed    Assistance by cardiology and pulmonary greatly appreciated        Allergies   Allergen Reactions   . Lenalidomide Rash   . Pomalidomide Hives   . Ceclor [Cefaclor] Hives            Current Facility-Administered Medications:   .  0.9 % sodium chloride infusion, , Intravenous, Continuous, Laurette Schimke, MD, Last Rate: 150 mL/hr at 01/24/18 1137  .  [COMPLETED] diltiazem injection 10 mg, 10 mg, Intravenous, Once, 10 mg at 01/24/18 1213 **FOLLOWED BY** diltiazem 125 mg in dextrose 5 % 125 mL infusion, 5 mg/hr, Intravenous, Continuous, Lily Kocher, MD, Last Rate: 15 mL/hr at 01/24/18 1416, 15 mg/hr at 01/24/18 1416  .  LORazepam (ATIVAN) injection 1 mg, 1 mg, Intravenous, Q6H PRN, Darlyn Read, MD  .  atorvastatin (LIPITOR) tablet 10 mg, 10 mg, Oral, Nightly, Lisabeth Devoid., DO, 10 mg at 01/23/18 1951  .  bisacodyl (DULCOLAX) EC tablet 10 mg, 10 mg, Oral, Daily PRN, Lisabeth Devoid., DO  .  calcium elemental (OSCAL) tablet 500 mg, 500 mg, Oral, BID, Lisabeth Devoid., DO, 500 mg at 01/24/18 4403  .  fentaNYL (DURAGESIC) 50 MCG/HR 1 patch, 1 patch, Transdermal, Q72H, Lisabeth Devoid.,  DO, 1 patch at 01/22/18 2206  .  gabapentin (NEURONTIN) capsule 300 mg, 300 mg, Oral, TID, Lisabeth Devoid., DO, 300 mg at 01/24/18 1310  .  ondansetron (ZOFRAN) tablet 8 mg, 8 mg, Oral, Q6H PRN, Lisabeth Devoid., DO  .  oxyCODONE-acetaminophen (PERCOCET) 7.5-325 MG per tablet 1 tablet, 1 tablet, Oral, Q4H PRN, Lisabeth Devoid., DO, 1 tablet at 01/24/18 0911  .  pantoprazole (PROTONIX) tablet 40 mg, 40 mg, Oral, QAM AC, Lisabeth Devoid., DO, 40 mg at 01/24/18 0853  .  prochlorperazine (COMPAZINE) tablet 10 mg, 10 mg, Oral, Q6H PRN, Lisabeth Devoid., DO  .  sennosides-docusate sodium (SENOKOT-S) 8.6-50 MG tablet 2 tablet, 2 tablet, Oral, Daily, Lisabeth Devoid., DO, 2 tablet at 01/24/18 [phone]  .  sertraline (ZOLOFT) tablet 50 mg, 50 mg, Oral, Daily, Lisabeth Devoid., DO, 50 mg at 01/24/18 5956  .  valACYclovir (VALTREX) tablet 500 mg, 500 mg, Oral, BID, Lisabeth Devoid., DO,  500 mg at 01/24/18 0853  .  meropenem (MERREM) 1 g in sodium chloride 0.9 % 100 mL IVPB (mini-bag), 1 g, Intravenous, Q8H, Lisabeth Devoid., DO, Last Rate: 200 mL/hr at 01/24/18 1625, 1 g at 01/24/18 1625  .  sodium chloride flush 0.9 % injection 10 mL, 10 mL, Intravenous, 2 times per day, Lisabeth Devoid., DO, 10 mL at 01/24/18 0454  .  sodium chloride flush 0.9 % injection 10 mL, 10 mL, Intravenous, PRN, Lisabeth Devoid., DO  .  potassium chloride 20 mEq/50 mL IVPB [facility]), 20 mEq, Intravenous, PRN, Lisabeth Devoid., DO  .  magnesium sulfate 4 g in 100 mL IVPB premix, 4 g, Intravenous, PRN, Lisabeth Devoid., DO  .  magnesium hydroxide (MILK OF MAGNESIA) 400 MG/5ML suspension 30 mL, 30 mL, Oral, Daily PRN, Lisabeth Devoid., DO  .  Saline Mouthwash 15 mL, 15 mL, Swish & Spit, 4x Daily AC & HS, Lisabeth Devoid., DO, 15 mL at 01/24/18 1010  .  Saline Mouthwash 15 mL, 15 mL, Swish & Spit, Q4H PRN, Lisabeth Devoid., DO  .  alteplase (CATHFLO) injection 2 mg, 2 mg, Intracatheter, PRN, Lisabeth Devoid., DO  .   tiotropium [facility]) inhalation capsule 18 mcg, 18 mcg, Inhalation, Daily, Lisabeth Devoid., DO, 18 mcg at 01/24/18 0757  .  budesonide (PULMICORT) nebulizer suspension 250 mcg, 0.25 mg, Nebulization, BID, 250 mcg at 01/24/18 0757 **AND** formoterol (PERFOROMIST) nebulizer solution 20 mcg, 20 mcg, Nebulization, BID, Lisabeth Devoid., DO, 20 mcg at 01/24/18 0757  .  LORazepam (ATIVAN) tablet 1 mg, 1 mg, Oral, Q4H PRN, Lisabeth Devoid., DO, 1 mg at 01/22/18 0815  .  morphine (PF) injection 2 mg, 2 mg, Intravenous, Q4H PRN, 2 mg at 01/22/18 1907 **OR** morphine injection 4 mg, 4 mg, Intravenous, Q4H PRN, Lisabeth Devoid., DO, 4 mg at 01/24/18 0418  .  levalbuterol (XOPENEX) nebulizer solution 1.25 mg, 1.25 mg, Nebulization, Q4H WA, Lisabeth Devoid., DO, 1.25 mg at 01/24/18 1140  .  methylPREDNISolone sodium (SOLU-MEDROL) injection 80 mg, 80 mg, Intravenous, Q8H, Darlyn Read, MD, 80 mg at 01/24/18 1309  .  albuterol (PROVENTIL) nebulizer solution 2.5 mg, 2.5 mg, Nebulization, Q2H PRN, Darlyn Read, MD, 2.5 mg at 01/24/18 1535                          Physical Exam:     Vital Signs:  BP 97/76   Pulse 126   Temp 98 F (36.7 C) (Oral)   Resp 22   Ht 5\' 9"  (1.753 m)   Wt 194 lb 10.7 oz (88.3 kg)   SpO2 94%   BMI 28.75 kg/m     Weight:    Wt Readings from Last 3 Encounters:   01/22/18 194 lb 10.7 oz (88.3 kg)   01/18/18 194 lb 9.6 oz (88.3 kg)   01/10/18 194 lb 12.8 oz (88.4 kg)     General: Awake, alert and oriented x 3 but in distress; holding onto bed  HEENT:normocephalic, alopecia,PERRL, no scleral erythema or icterus, Oral mucosa moist and intact  NECK: supple without palpable adenopathy  BACK: Straight negative CVAT  SKIN:warm dry and intact without lesions rashes or masses  CHEST:Wheezes better; no crackles today; mostly end expiratory wheezes today  CV: Normal S1 S2, RRR, no MRG  ABD:NT  ND normoactive BS, no palpable masses or hepatosplenomegaly  EXTREMITIES:without edema, denies  calf tenderness  NEURO: CN II - XII grossly intact  CATHETER:Right IJ PAC (06/21/17, [facility])- CDI      Discharge Laboratory Data:  CBC:   Recent Labs     01/22/18  1927 01/23/18  0340 01/24/18  0410   WBC 0.1* 0.1* 0.1*   HGB 10.3* 10.1* 9.0*   HCT 31.1* 30.4* 26.7*   MCV 94.0 95.0 93.3   PLT 21* 20* 18*     BMP/Mag:  Recent Labs     01/22/18  0112 01/22/18  0700 01/22/18  1927 01/23/18  0340 01/24/18  0410   NA  --  128* 133* 132* 136   K  --  4.4 4.7 4.2 4.5   CL  --  93* 95* 97* 101   CO2  --  22 23 21 23    PHOS  --  3.0  --   --   --    BUN  --  15 14 13  21*   CREATININE  --  0.6* 0.7* 0.7* 0.6*   MG 1.40* 1.50*  --   --   --      LIVP:   Recent Labs     01/22/18  0112 01/22/18  0700   AST 16 20   ALT 45* 46*   BILIDIR 0.3  --    BILITOT 0.7 0.5   ALKPHOS 66 65     Coags:   Recent Labs     01/22/18  0700   PROTIME 15.5*   INR 1.36*     Uric Acid   Recent Labs     01/22/18  0112   LABURIC 2.5*     Diagnostics:  1. PET scan (01/10/18):      Myeloma Labs (01/03/18):  SPEP:reveals that M2, previously characterized as monoclonal IgG kappa in  mid-to-slow gamma is 1.0 gm/dL, greater than the 0.2 gm/dL observed on 09 Nov 9602.  SIFE:Serum immunofixation electrophoresis reveals persistent M2 in mid-to-slow gamma, a monoclonal IgG kappa. A recent M-spike, M1, was minor monoclonal IgG lambda in mid-gamma; M1 continues to be absent.  SFLC:  Kappa:  30.80, previously (11/08/17) -10.40  Lambda:  1.46, previously (11/08/17) -5.97  Free Kappa Lambda Ratio:  21.10, previously (11/08/17) -1.74  Quantitative Immunoglobulins:  IgG:  1480, previously (11/08/17) - 552  IgA:  <26, previously (11/08/17) -30  IgM:  <20    PROBLEM LIST:      1.IgG Kappa MultipleMyeloma/Plasma Cell Leukemia (Dx8/2018)  2.Peripheral neuropathy  3. Anxiety/Depression  4.Hyperlipidemia  5.Hypertension  6.Insomnia  7.Chronic lowback paind/t neoplasm (h/o lytic lesions&cord compression @T6  & T11)  8.  COPD  9.Influenza A(10/12/17)    TREATMENT:      1. Rad Tx to T5-7, T10-L3, Right Scapula - 3000 cGy - Dr. Bonita Quin 03/20/16-04/01/16  2. RVD x1 03/20/16 - discontinued d/t rash   3. Velcade/Pomalyst/Dex x3 cycles 04/23/16-06/17/16 - discontinued d/t reaction to pomalyst  4. Velcade/Dex x 1 cycle 06/26/16 (last dose of dexamethasone 07/22/16  5. High-dose melphalan followed by administration of PBSCs 2.36 x10^6 cd34cells/kg on 08/28/16  6. Maintenance Revlimid 10mg  daily (12/2016-04/23/17)  7. Dexamethasone 40mg  daily (04/24/17)  8. DCEP   Cycle #1 - 04/26/17  Cycle #2 - 05/25/17 - excellent response  9.Dara/Velcade/Dex (C1D1 - 06/22/17) - BMBx 10/18/17 - CR  Cycles 7-8 (21 day cycle)  - Dex 20 mg po D4,5.8,9,11 and 12  - Dara 16 mg/Kg D1 only  with Dex 20 mg IV  - Velcade 1.3 mg/M2 D1,4,8 and 11  Cycle 9 and beyond (maintenance, 11/08/17- 01/03/18)  Dara 16 mg/Kg Q 28 days  Dex 12 mg IV D1  Velcade Q 2 weeks  10. DCEP   Cycle #1 - 01/13/18    ASSESSMENT AND PLAN:     1. IgG kappa multiple myeloma / Plasma Cell Leukemia:Relapsed disease  - S/ptreatmentDara/Velcade/Dexx 8 cycles(06/22/17 - 10/2017). Followed bymaintenance DaraQ4 wks/VelcadeQ2ks/Dex(started 11/08/17)  - Now w/ progressive disease based on myeloma labs (01/03/18) & PET scan (01/10/18), see above    PLAN:DCEP for disease and pain control. Hope for allogeneic transplant in CR2, but needs to disease response and improvement in lung function    DCEP Cycle #1, Day +12; counts nadir    2. ID: Afebrile  - Valtrex and Fluconazole  - Meropenem, day #3    3. Heme:Anemia d/t chemotherapy  - Transfuse for Hgb < 7 and Platelets <20K    4.Metabolic: Stable renal fxn and e-lytesexcept for hypoNa &steroid-induced hyperglycemia     5. Pulmonary:No acute issues, but currently on steroid taper for COPD per Dr. Karie Kirks   - Pulm Nodule: StableonCT chest From8/24/18& 10/18/17 & 11/26/17; cont to monitor.   - CTPA(11/26/17): No PE;resolution  ofmild upper lobe tree-in-bud opacities; Mild emphysema. Two 3 mm LLL pulmonary nodules, stable  - PFT (12/27/17):compared to11/2017:FVC 3.25 L, 66%, and FEV1  1.42 L, 38%. FEV1/FVC is 44%. This is unchanged from prior study. Air trapping seen on lung volumes. Diffusion capacity 3.91, 79% of predicted, down from 5.21  - ABG; repeat CXR  - CT negative upon admission    - solumedrol 80 TID - addressing COPD and pericarditis    6. GI/Nutrition: Appetite and oral intake is good  - Cont PPI ppx w/ steroids  - Cont low microbial diet   Constipation:  Improved  - Cont Dulcolax & Senokot-s prn (01/16/18)     7. 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  - Lipitor   - diltiazem gtt,  15 mg/hr  - solumedrol for pericarditis; as tapered and if counts improve, consider colchicine    8. Psych:  Concerned about disease progression and ability to get transplant   Anxiety/Depression: Ongoing  - Cont Zoloft50 mg daily  Insomnia:No complaintscurrently    9. Peripheral Neuropathy:Ongoing  - Cont Gabapentin 300 mgTID    10. Bone Health:No acute fx   -H/o spinal cord compression & diffuse lytic lesions t/o skeleton  - CT Chest (04/23/17) - multiple lytic lesions throughout the skeleton   - Cont Ca/Vit D &Zometa monthly (given5/6/19, next due6/3/19)    11.Acute onChronic left lower back pain:Improving w/ chemotherapy; d/t Neoplasm and relapsed/progressive disease  - S/p Radiation (09/01/17 - 09/15/17,Fried)  - Cont Fentanyl patch50 mcg/hr (increased 01/13/18) - Rx 01/18/18  - Resume Percocet 7.5/325 mg q 4hrs prn (No Rx on d/c)      DVT Prophylaxis:  SCD's; plts < 50K    Dispostion:   Tenuous, unknown      Idalys Konecny A. Nanci Pina, DO, MS  [facility]    Please contact via:  1.  Perfect Serve  2.  Cell Phone:  [phone]    01/24/2018   4:45 PM

## 2018-01-24 NOTE — Progress Notes (Signed)
Still sob. Has some cp with deep breaths, as he had yesterday. Complaining of more rib pain on L side this morning.    PE:  Blood pressure 137/89, pulse 113, temperature 98.2 ??F (36.8 ??C), temperature source Oral, resp. rate 21, weight 194 lb 10.7 oz (88.3 kg), SpO2 96 %.  General (appearance):  Alert and cooperative.   Eyes: Anicteric  Neck: Supple. No JVD  Ears/Nose/Mouth/Thorat: No cyanosis  CV: Regular tachycardia. No obvious m/r/g  Respiratory:  Wheezing, tachypnic.  Getting treatment during exam this a.m.  GI: Abd s/nt/nd. No peritoneal signs  Skin: Warm, dry. No rashes  Neuro/Psych: Alert and oriented x 3. Appropriate behavior  Ext:  No c/c. No pitting edema  Pulses:  2+ radial    Lab Results   Component Value Date    WBC 0.1 (LL) 01/24/2018    HGB 9.0 (L) 01/24/2018    HCT 26.7 (L) 01/24/2018    MCV 93.3 01/24/2018    PLT 18 (LL) 01/24/2018     Lab Results   Component Value Date    NA 136 01/24/2018    K 4.5 01/24/2018    CL 101 01/24/2018    CO2 23 01/24/2018    BUN 21 (H) 01/24/2018    CREATININE 0.6 (L) 01/24/2018    GLUCOSE 191 (H) 01/24/2018    CALCIUM 8.9 01/24/2018    PROT 6.3 (L) 01/22/2018    LABALBU 3.2 (L) 01/22/2018    BILITOT 0.5 01/22/2018    ALKPHOS 65 01/22/2018    AST 20 01/22/2018    ALT 46 (H) 01/22/2018    LABGLOM >60 01/24/2018    GFRAA >60 01/24/2018    AGRATIO 1.0 (L) 01/22/2018    GLOB 3.1 01/22/2018     Lab Results   Component Value Date    INR 1.36 (H) 01/22/2018    INR 1.08 01/17/2018    INR 1.09 01/13/2018    PROTIME 15.5 (H) 01/22/2018    PROTIME 12.3 01/17/2018    PROTIME 12.4 01/13/2018     Lab Results   Component Value Date    TROPONINI <0.01 01/22/2018     01/21/2018 TTE: EF 50-55%. No def RWMA (inferior and inferoseptum was normal). No sig valve regurgitation or stenosis.    01/22/18 ECGs reviewed. Earlier ecg seemed to demonstrate acute inferior MI but Tns negative and further ecgs showed evolution other ST segments suggesting acute pericarditis.    01/21/2018 CTA Chest: No  PE. Tree-in-bud bilateral opacities suggesting atypical infection/inflammation. Enlarged L axillary notes unchanged. Ribs, spine, and scapula lesions suggesting MM/bony mets. No sig pericardial effusion noted.    01/22/18 CXR: Hyperinflated lungs. No definite acute consolidation    A/P:  62 y.o. admitted with severe SOB.  -Acute pericarditis  -Multiple myeloma  -COPD  -Neutropenia  -Pneumonia with "tree in bud" appearance of CT chest  -Hyperlipidemia    Recs:  -Seems major issue is the lungs/myeloma  -No sig effusion on echo; treat pericarditis medically  -On steroids for lungs  -Consider tacking on colchicine 0.6 q day or BID if acceptable from BMT/diarrhea standpoint. Rebound likelihood is somewhat high with steroid taper.  -Not much else to add.    Call with questions.    Roderic Palau A. Bunnie Philips, MD, [facility], [facility]

## 2018-01-24 NOTE — Progress Notes (Signed)
Per MD Harrison Mons RN to give the remainder of the fluid bolus he ordered prior over one hour. RN then to increase patients fluid rate of normal saline to 150 ml/hr an hour. RN will monitor and update MD Harrison Mons as needed. Will continue to monitor.

## 2018-01-24 NOTE — Progress Notes (Signed)
HR still remains in the 140-130's. Diltiazem gtt increased to 15 mL/hr. Will continue to monitor.

## 2018-01-25 LAB — BASIC METABOLIC PANEL
Anion Gap: 9 (ref 3–16)
BUN: 23 mg/dL — ABNORMAL HIGH (ref 7–20)
CO2: 24 mmol/L (ref 21–32)
Calcium: 8 mg/dL — ABNORMAL LOW (ref 8.3–10.6)
Chloride: 102 mmol/L (ref 99–110)
Creatinine: 0.6 mg/dL — ABNORMAL LOW (ref 0.9–1.3)
GFR African American: >60 (ref >60)
GFR Non-African American: >60 (ref >60)
Glucose: 216 mg/dL — ABNORMAL HIGH (ref 70–99)
Potassium: 4.1 mmol/L (ref 3.5–5.1)
Sodium: 135 mmol/L — ABNORMAL LOW (ref 136–145)

## 2018-01-25 LAB — CBC WITH AUTO DIFFERENTIAL
Hematocrit: 22.7 % — ABNORMAL LOW (ref 40.5–52.5)
Hemoglobin: 7.6 g/dL — ABNORMAL LOW (ref 13.5–17.5)
MCH: 31.6 pg (ref 26.0–34.0)
MCHC: 33.6 g/dL (ref 31.0–36.0)
MCV: 94 fL (ref 80.0–100.0)
MPV: 9 fL (ref 5.0–10.5)
Platelets: 13 10*3/uL — CL (ref 135–450)
RBC: 2.41 M/uL — ABNORMAL LOW (ref 4.20–5.90)
RDW: 17.7 % — ABNORMAL HIGH (ref 12.4–15.4)
WBC: 0.1 10*3/uL — CL (ref 4.0–11.0)

## 2018-01-25 LAB — POCT GLUCOSE
POC Glucose: 256 mg/dl — ABNORMAL HIGH (ref 70–99)
POC Glucose: 194 mg/dl — ABNORMAL HIGH (ref 70–99)

## 2018-01-25 LAB — HEPATIC FUNCTION PANEL
ALT: 54 U/L — ABNORMAL HIGH (ref 10–40)
AST: 26 U/L (ref 15–37)
Albumin: 2.8 g/dL — ABNORMAL LOW (ref 3.4–5.0)
Alkaline Phosphatase: 86 U/L (ref 40–129)
Bilirubin, Direct: <0.2 mg/dL (ref 0.0–0.3)
Total Bilirubin: 0.3 mg/dL (ref 0.0–1.0)
Total Protein: 5.9 g/dL — ABNORMAL LOW (ref 6.4–8.2)

## 2018-01-25 LAB — MAGNESIUM: Magnesium: 1.9 mg/dL (ref 1.80–2.40)

## 2018-01-25 LAB — APTT: aPTT: 27.2 s (ref 26.0–36.0)

## 2018-01-25 LAB — PROTIME-INR
INR: 1.18 — ABNORMAL HIGH (ref 0.86–1.14)
Protime: 13.5 s — ABNORMAL HIGH (ref 9.8–13.0)

## 2018-01-25 LAB — LACTATE DEHYDROGENASE: LD: 134 U/L (ref 100–190)

## 2018-01-25 LAB — PHOSPHORUS: Phosphorus: 1.6 mg/dL — ABNORMAL LOW (ref 2.5–4.9)

## 2018-01-25 LAB — CULTURE, HSV

## 2018-01-25 LAB — URIC ACID: Uric Acid, Serum: 2.1 mg/dL — ABNORMAL LOW (ref 3.5–7.2)

## 2018-01-25 MED ORDER — DEXTROSE 5 % IV SOLN
5 % | INTRAVENOUS | Status: DC
Start: 2018-01-25 — End: 2018-01-26
  Administered 2018-01-25: 19:00:00 0.5 mg/min via INTRAVENOUS

## 2018-01-25 MED ORDER — POTASSIUM PHOSPHATE 3 MMOL/ML IV SOLN (MIXTURES ONLY)
15 mmol/5 mL | Freq: Once | INTRAVENOUS | Status: AC
Start: 2018-01-25 — End: 2018-01-25
  Administered 2018-01-25: 15:00:00 30 mmol via INTRAVENOUS

## 2018-01-25 MED ORDER — INSULIN LISPRO 100 UNIT/ML SC SOPN
100 UNIT/ML | Freq: Every evening | SUBCUTANEOUS | Status: DC
Start: 2018-01-25 — End: 2018-02-08
  Administered 2018-01-26: 04:00:00 5 [IU] via SUBCUTANEOUS
  Administered 2018-01-27: 02:00:00 2 [IU] via SUBCUTANEOUS
  Administered 2018-01-28: 01:00:00 5 [IU] via SUBCUTANEOUS
  Administered 2018-01-29: 03:00:00 6 [IU] via SUBCUTANEOUS
  Administered 2018-01-31: 01:00:00 2 [IU] via SUBCUTANEOUS
  Administered 2018-02-01: 01:00:00 3 [IU] via SUBCUTANEOUS
  Administered 2018-02-02: 01:00:00 2 [IU] via SUBCUTANEOUS
  Administered 2018-02-03: 01:00:00 5 [IU] via SUBCUTANEOUS
  Administered 2018-02-04: 02:00:00 3 [IU] via SUBCUTANEOUS
  Administered 2018-02-06: 5 [IU] via SUBCUTANEOUS
  Administered 2018-02-07 – 2018-02-08 (×2): 2 [IU] via SUBCUTANEOUS

## 2018-01-25 MED ORDER — AMIODARONE HCL 150 MG/3ML IV SOLN
1503 MG/3ML | INTRAVENOUS | Status: AC
Start: 2018-01-25 — End: 2018-01-25
  Administered 2018-01-25: 14:00:00 1 mg/min via INTRAVENOUS

## 2018-01-25 MED ORDER — GLUCOSE 40 % PO GEL
40 % | ORAL | Status: DC | PRN
Start: 2018-01-25 — End: 2018-02-08

## 2018-01-25 MED ORDER — DEXTROSE 5 % IV SOLN
5 % | Freq: Once | INTRAVENOUS | Status: DC
Start: 2018-01-25 — End: 2018-01-25

## 2018-01-25 MED ORDER — DEXTROSE 50 % IV SOLN
50 % | INTRAVENOUS | Status: DC | PRN
Start: 2018-01-25 — End: 2018-02-08

## 2018-01-25 MED ORDER — GLUCAGON HCL RDNA (DIAGNOSTIC) 1 MG IJ SOLR
1 MG | INTRAMUSCULAR | Status: DC | PRN
Start: 2018-01-25 — End: 2018-02-08

## 2018-01-25 MED ORDER — INSULIN LISPRO 100 UNIT/ML SC SOPN
100 UNIT/ML | Freq: Three times a day (TID) | SUBCUTANEOUS | Status: DC
Start: 2018-01-25 — End: 2018-02-08
  Administered 2018-01-25: 21:00:00 3 [IU] via SUBCUTANEOUS
  Administered 2018-01-25: 15:00:00 9 [IU] via SUBCUTANEOUS
  Administered 2018-01-26: 12:00:00 6 [IU] via SUBCUTANEOUS
  Administered 2018-01-26 (×2): 9 [IU] via SUBCUTANEOUS
  Administered 2018-01-27 – 2018-01-28 (×5): 6 [IU] via SUBCUTANEOUS
  Administered 2018-01-28: 16:00:00 9 [IU] via SUBCUTANEOUS
  Administered 2018-01-29: 16:00:00 6 [IU] via SUBCUTANEOUS
  Administered 2018-01-29: 22:00:00 3 [IU] via SUBCUTANEOUS
  Administered 2018-01-30 (×3): 6 [IU] via SUBCUTANEOUS
  Administered 2018-01-31: 21:00:00 3 [IU] via SUBCUTANEOUS
  Administered 2018-01-31 – 2018-02-01 (×2): 6 [IU] via SUBCUTANEOUS
  Administered 2018-02-01 – 2018-02-02 (×4): 3 [IU] via SUBCUTANEOUS
  Administered 2018-02-04: 11:00:00 9 [IU] via SUBCUTANEOUS
  Administered 2018-02-04 (×2): 3 [IU] via SUBCUTANEOUS
  Administered 2018-02-05 (×2): 6 [IU] via SUBCUTANEOUS
  Administered 2018-02-06: 22:00:00 5 [IU] via SUBCUTANEOUS
  Administered 2018-02-06: 14:00:00 3 [IU] via SUBCUTANEOUS
  Administered 2018-02-06 – 2018-02-07 (×3): 6 [IU] via SUBCUTANEOUS
  Administered 2018-02-07 – 2018-02-08 (×2): 3 [IU] via SUBCUTANEOUS

## 2018-01-25 MED ORDER — FUROSEMIDE 10 MG/ML IJ SOLN
10 MG/ML | Freq: Once | INTRAMUSCULAR | Status: AC
Start: 2018-01-25 — End: 2018-01-25
  Administered 2018-01-25: 15:00:00 40 mg via INTRAVENOUS

## 2018-01-25 MED ORDER — ANIDULAFUNGIN 100 MG IV SOLR
100 MG | INTRAVENOUS | Status: DC
Start: 2018-01-25 — End: 2018-02-08
  Administered 2018-01-26 – 2018-02-08 (×14): 100 mg via INTRAVENOUS

## 2018-01-25 MED ORDER — POTASSIUM PHOSPHATE 3 MMOL/ML IV SOLN (MIXTURES ONLY)
15 mmol/5 mL | INTRAVENOUS | Status: DC | PRN
Start: 2018-01-25 — End: 2018-01-27
  Administered 2018-01-26 – 2018-01-27 (×2): 20 mmol via INTRAVENOUS

## 2018-01-25 MED ORDER — MAGNESIUM SULFATE 2000 MG/50 ML IVPB PREMIX
2 GM/50ML | INTRAVENOUS | Status: DC | PRN
Start: 2018-01-25 — End: 2018-02-08
  Administered 2018-01-25 – 2018-01-29 (×2): 2 g via INTRAVENOUS

## 2018-01-25 MED ORDER — POTASSIUM CHLORIDE 20 MEQ/50ML IV SOLN
20 MEQ/50ML | INTRAVENOUS | Status: DC | PRN
Start: 2018-01-25 — End: 2018-02-08

## 2018-01-25 MED ORDER — DILTIAZEM HCL 125 MG/25ML IV SOLN
12525 MG/25ML | INTRAVENOUS | Status: DC
Start: 2018-01-25 — End: 2018-01-27
  Administered 2018-01-25: 21:00:00 5 mg/h via INTRAVENOUS
  Administered 2018-01-26 – 2018-01-27 (×2): 10 mg/h via INTRAVENOUS

## 2018-01-25 MED ORDER — DEXTROSE 5 % IV SOLN
5 % | Freq: Once | INTRAVENOUS | Status: AC
Start: 2018-01-25 — End: 2018-01-25
  Administered 2018-01-25: 13:00:00 150 mg via INTRAVENOUS

## 2018-01-25 MED ORDER — DILTIAZEM HCL 125 MG/25ML IV SOLN
125 MG/25ML | INTRAVENOUS | Status: DC
Start: 2018-01-25 — End: 2018-01-25

## 2018-01-25 MED ORDER — METHYLPREDNISOLONE SODIUM SUCC 40 MG IJ SOLR
40 MG | Freq: Three times a day (TID) | INTRAMUSCULAR | Status: DC
Start: 2018-01-25 — End: 2018-01-26
  Administered 2018-01-25 – 2018-01-26 (×3): 40 mg via INTRAVENOUS

## 2018-01-25 MED ORDER — DEXTROSE 5 % IV SOLN
5 % | INTRAVENOUS | Status: DC | PRN
Start: 2018-01-25 — End: 2018-02-08

## 2018-01-25 MED ORDER — DEXTROSE 5 % IV SOLN
5 % | Freq: Once | INTRAVENOUS | Status: AC
Start: 2018-01-25 — End: 2018-01-25
  Administered 2018-01-25: 16:00:00 200 mg via INTRAVENOUS

## 2018-01-25 MED FILL — MEROPENEM 1 G IV SOLR: 1 g | INTRAVENOUS | Qty: 1

## 2018-01-25 MED FILL — SOLU-MEDROL 125 MG IJ SOLR: 125 mg | INTRAMUSCULAR | Qty: 125

## 2018-01-25 MED FILL — PANTOPRAZOLE SODIUM 40 MG PO TBEC: 40 mg | ORAL | Qty: 1

## 2018-01-25 MED FILL — SERTRALINE HCL 50 MG PO TABS: 50 mg | ORAL | Qty: 1

## 2018-01-25 MED FILL — METOPROLOL TARTRATE 25 MG PO TABS: 25 mg | ORAL | Qty: 1

## 2018-01-25 MED FILL — LEVALBUTEROL HCL 1.25 MG/0.5ML IN NEBU: 1.25 MG/0.5ML | RESPIRATORY_TRACT | Qty: 1

## 2018-01-25 MED FILL — PERFOROMIST 20 MCG/2ML IN NEBU: 20 MCG/2ML | RESPIRATORY_TRACT | Qty: 2

## 2018-01-25 MED FILL — OXYCODONE-ACETAMINOPHEN 7.5-325 MG PO TABS: 7.5-325 mg | ORAL | Qty: 1

## 2018-01-25 MED FILL — AMIODARONE HCL 450 MG/9ML IV SOLN: 450 MG/9ML | INTRAVENOUS | Qty: 9

## 2018-01-25 MED FILL — LIPITOR 20 MG PO TABS: 20 mg | ORAL | Qty: 1

## 2018-01-25 MED FILL — OYSTER SHELL CALCIUM 500 MG PO TABS: 500 mg | ORAL | Qty: 1

## 2018-01-25 MED FILL — FLUCONAZOLE 200 MG PO TABS: 200 mg | ORAL | Qty: 1

## 2018-01-25 MED FILL — FUROSEMIDE 10 MG/ML IJ SOLN: 10 mg/mL | INTRAMUSCULAR | Qty: 4

## 2018-01-25 MED FILL — METHYLPREDNISOLONE SODIUM SUCC 40 MG IJ SOLR: 40 mg | INTRAMUSCULAR | Qty: 40

## 2018-01-25 MED FILL — ALBUTEROL SULFATE (2.5 MG/3ML) 0.083% IN NEBU: RESPIRATORY_TRACT | Qty: 3

## 2018-01-25 MED FILL — ERAXIS 100 MG IV SOLR: 100 mg | INTRAVENOUS | Qty: 60

## 2018-01-25 MED FILL — VALACYCLOVIR HCL 500 MG PO TABS: 500 mg | ORAL | Qty: 1

## 2018-01-25 MED FILL — BUDESONIDE 0.25 MG/2ML IN SUSP: 0.25 MG/2ML | RESPIRATORY_TRACT | Qty: 2

## 2018-01-25 MED FILL — AMIODARONE HCL 150 MG/3ML IV SOLN: 150 MG/3ML | INTRAVENOUS | Qty: 9

## 2018-01-25 MED FILL — GABAPENTIN 300 MG PO CAPS: 300 mg | ORAL | Qty: 1

## 2018-01-25 MED FILL — MAGNESIUM SULFATE 2 GM/50ML IV SOLN: 2 GM/50ML | INTRAVENOUS | Qty: 50

## 2018-01-25 MED FILL — SODIUM CHLORIDE 0.9 % IV SOLN: 0.9 % | INTRAVENOUS | Qty: 1000

## 2018-01-25 MED FILL — POTASSIUM PHOSPHATES 45 MMOLE/15ML IV SOLN: 45 MMOLE/15ML | INTRAVENOUS | Qty: 10

## 2018-01-25 MED FILL — DILTIAZEM HCL 125 MG/25ML IV SOLN: 125 MG/25ML | INTRAVENOUS | Qty: 25

## 2018-01-25 MED FILL — SENNOSIDES-DOCUSATE SODIUM 8.6-50 MG PO TABS: 8.6-50 mg | ORAL | Qty: 2

## 2018-01-25 MED FILL — MORPHINE SULFATE 4 MG/ML IJ SOLN: 4 mg/mL | INTRAMUSCULAR | Qty: 1

## 2018-01-25 MED FILL — AMIODARONE HCL 150 MG/3ML IV SOLN: 150 MG/3ML | INTRAVENOUS | Qty: 3

## 2018-01-25 MED FILL — DILTIAZEM HCL 50 MG/10ML IV SOLN: 50 MG/10ML | INTRAVENOUS | Qty: 25

## 2018-01-25 MED FILL — LORAZEPAM 2 MG/ML IJ SOLN: 2 mg/mL | INTRAMUSCULAR | Qty: 1

## 2018-01-25 MED FILL — HUMALOG KWIKPEN 100 UNIT/ML SC SOPN: 100 [IU]/mL | SUBCUTANEOUS | Qty: 3

## 2018-01-25 NOTE — Progress Notes (Signed)
CC: COPD exacerbation    Feeling somewhat better, less chest pain.  Cough remains uncomfortable, but he is able to clear mucopurulent sputum.  Atrial fibrillation, rate controlled with Cardizem infusion and 3 doses of metoprolol.  Changing to amiodarone this morning.  "Will I be going home tomorrow?"  Afebrile  WBC 0.1  Empiric antibiotic therapy with meropenem, for neutropenia.  Continues on methylprednisolone 80 mg every 8 hours  BP 126/88.  Atrial fibrillation, rate ~80  S1, S2 diminished  O2 saturation 97% on O2 at 2 L/m  Breath sounds moderately decreased, with scattered expiratory wheezes  Abdomen soft and no tenderness.  No peripheral edema.  Fluid balance +3.7 L/24 hours, irrespective of insensible respiratory loss.  Creatinine stable 0.6.  Sodium 135, potassium 4.1, chloride 102, CO2 24.  Glucose 216.    A&P: Multiple myeloma, on active treatment, with subsequent neutropenia.  Exacerbation of COPD improving with high-dose steroids and bronchodilators.  Steroid dose can be reduced.  Atrial fibrillation, likely triggered by COPD.  Rate controlled on Cardizem.  Switching to amiodarone.  Chest pain, with findings of pericarditis.  He may respond to NSAID.  Renal function intact.  Hemodynamically stable.  Discussed with Dr. Bunnie Philips

## 2018-01-25 NOTE — Progress Notes (Addendum)
Devel a fib with rvr.  No angina. + SOB. Denied major changes, however.    PE:  Blood pressure (!) 123/96, pulse 115, temperature 97.6 ??F (36.4 ??C), temperature source Oral, resp. rate 21, height 5' 9"  (1.753 m), weight 200 lb 2.8 oz (90.8 kg), SpO2 98 %.  General (appearance):  Alert and cooperative.   Eyes: Anicteric  Neck: Supple. No JVD  Ears/Nose/Mouth/Thorat: No cyanosis  CV: Irreg irreg tachycardia. No obvious m/r/g  Respiratory:  Wheezing, tachypnic.  Getting treatment during exam this a.m.  GI: Abd s/nt/nd. No peritoneal signs  Skin: Warm, dry. No rashes  Neuro/Psych: Alert and oriented x 3. Appropriate behavior  Ext:  No c/c. No pitting edema  Pulses:  2+ radial    Lab Results   Component Value Date    WBC 0.1 (LL) 01/25/2018    HGB 7.6 (L) 01/25/2018    HCT 22.7 (L) 01/25/2018    MCV 94.0 01/25/2018    PLT 13 (LL) 01/25/2018     Lab Results   Component Value Date    NA 135 (L) 01/25/2018    K 4.1 01/25/2018    CL 102 01/25/2018    CO2 24 01/25/2018    BUN 23 (H) 01/25/2018    CREATININE 0.6 (L) 01/25/2018    GLUCOSE 216 (H) 01/25/2018    CALCIUM 8.0 (L) 01/25/2018    PROT 6.3 (L) 01/22/2018    LABALBU 3.2 (L) 01/22/2018    BILITOT 0.5 01/22/2018    ALKPHOS 65 01/22/2018    AST 20 01/22/2018    ALT 46 (H) 01/22/2018    LABGLOM >60 01/25/2018    GFRAA >60 01/25/2018    AGRATIO 1.0 (L) 01/22/2018    GLOB 3.1 01/22/2018     Lab Results   Component Value Date    INR 1.36 (H) 01/22/2018    INR 1.08 01/17/2018    INR 1.09 01/13/2018    PROTIME 15.5 (H) 01/22/2018    PROTIME 12.3 01/17/2018    PROTIME 12.4 01/13/2018     Lab Results   Component Value Date    TROPONINI <0.01 01/22/2018     Lab Results   Component Value Date    ALT 46 (H) 01/22/2018    AST 20 01/22/2018    ALKPHOS 65 01/22/2018    BILITOT 0.5 01/22/2018       01/21/2018 TTE: EF 50-55%. No def RWMA (inferior and inferoseptum was normal). No sig valve regurgitation or stenosis.    01/22/18 ECGs reviewed. Earlier ecg seemed to demonstrate acute inferior  MI but Tns negative and further ecgs showed evolution other ST segments suggesting acute pericarditis.    01/21/2018 CTA Chest: No PE. Tree-in-bud bilateral opacities suggesting atypical infection/inflammation. Enlarged L axillary notes unchanged. Ribs, spine, and scapula lesions suggesting MM/bony mets. No sig pericardial effusion noted.    01/22/18 CXR: Hyperinflated lungs. No definite acute consolidation    A/P:  62 y.o. admitted with severe SOB.  -Acute pericarditis  -Multiple myeloma  -COPD  -Neutropenia  -Pneumonia with "tree in bud" appearance of CT chest  -Hyperlipidemia  -A fib with RVR    Recs:  -K>4, Mg>2  -Dilt gtt  -D/W Dr. Dorann Lodge.  Starting amio.  -Check mag and lft today    Call with questions.    Roderic Palau A. Bunnie Philips, MD, [facility], [facility]

## 2018-01-25 NOTE — Plan of Care (Signed)
Problem: Falls - Risk of:  Goal: Will remain free from falls  Description  Will remain free from falls  01/24/2018 1109 by Anastasia Fiedler, RN  Outcome: Ongoing  Pt is a High fall risk. See Lattie Corns Fall Score and ABCDS Injury Risk assessments. Explained fall risk precautions to pt and family and rationale behind their use to keep the patient safe. Pt bed is in low position, side rails up, call light and belongings are in reach. Fall wristband applied and present on pts wrist.  Bed alarm on.  Pt encouraged to call for assistance. Will continue with hourly rounds for PO intake, pain needs, toileting and repositioning as needed.     Problem: Pain:  Goal: Pain level will decrease  Description  Pain level will decrease  01/24/2018 1109 by Anastasia Fiedler, RN  Outcome: Ongoing  Pt complaining of left flank/chest pain due to pericarditis. See flowsheets for details. PRN pain medication given per the eMAR. Pt resting quietly most of the shift. Will continue to monitor and manage as needed.      Problem: Nutrition  Goal: Optimal nutrition therapy  01/24/2018 1109 by Anastasia Fiedler, RN  Outcome: Ongoing  Pt attempting to eat at each meal. Appetite improving. Will continue to monitor.      Problem: Cardiac:  Goal: Ability to maintain vital signs within normal range will improve  Description  Ability to maintain vital signs within normal range will improve  Outcome: Ongoing  Pt remains in atrial fibrillation. Pt switched to amio gtt today per cardiology. Pt HR stating below 100 bpm for a majority of the shift. BP tolerating gtt well. Will continue to monitor.     Problem: Bleeding:  Goal: Will show no signs and symptoms of excessive bleeding  Description  Will show no signs and symptoms of excessive bleeding  Outcome: Ongoing  Patient's hemoglobin this AM:   Recent Labs     01/25/18  0415   HGB 7.6*     Patient's platelet count this AM:   Recent Labs     01/25/18  0415   PLT 13*    Thrombocytopenia Precautions in place.  Patient  showing no signs or symptoms of active bleeding.  Transfusion not indicated at this time.  Patient verbalizes understanding of all instructions. Will continue to assess and implement POC. Call light within reach and hourly rounding in place. ;     Problem: Breathing Pattern - Ineffective:  Goal: Ability to achieve and maintain a regular respiratory rate will improve  Description  Ability to achieve and maintain a regular respiratory rate will improve  Outcome: Ongoing   Pt with SOB, tachypnea and dyspnea with exertion. Pt's lungs sound wheezy and rhonchorous. Pt on 1 L NC throughout shift. O2 sats kept above 92%.  Pt with strong productive cough. Will continue to monitor.

## 2018-01-25 NOTE — Progress Notes (Signed)
[place] Progress Note    01/25/2018     David Watkins    MRN: [id number]    DOB: December 23, 1955      SUBJECTIVE:  Dyspnea and left lower lateral rib pain/pleuritic pain    ECOG PS:  (2) Ambulatory and capable of self care, unable to carry out work activity, up and about > 50% or waking hours    Isolation: None    Medications    Scheduled Meds:  ??? insulin lispro  0-18 Units Subcutaneous TID WC   ??? insulin lispro  0-9 Units Subcutaneous Nightly   ??? fluconazole  200 mg Oral Daily   ??? metoprolol tartrate  25 mg Oral BID   ??? atorvastatin  10 mg Oral Nightly   ??? calcium elemental  500 mg Oral BID   ??? fentaNYL  1 patch Transdermal Q72H   ??? gabapentin  300 mg Oral TID   ??? pantoprazole  40 mg Oral QAM AC   ??? sennosides-docusate sodium  2 tablet Oral Daily   ??? sertraline  50 mg Oral Daily   ??? valACYclovir  500 mg Oral BID   ??? meropenem  1 g Intravenous Q8H   ??? sodium chloride flush  10 mL Intravenous 2 times per day   ??? Saline Mouthwash  15 mL Swish & Spit 4x Daily AC & HS   ??? tiotropium  18 mcg Inhalation Daily   ??? budesonide  0.25 mg Nebulization BID    And   ??? formoterol  20 mcg Nebulization BID   ??? levalbuterol  1.25 mg Nebulization Q4H WA   ??? methylPREDNISolone  80 mg Intravenous Q8H     Continuous Infusions:  ??? dextrose     ??? sodium chloride 150 mL/hr at 01/25/18 0753   ??? diltiazem (CARDIZEM) 125 mg in dextrose 5% 125 mL infusion 10 mg/hr (01/25/18 0826)     PRN Meds:.glucose, dextrose, glucagon (rDNA), dextrose, LORazepam, albuterol, bisacodyl, ondansetron, oxyCODONE-acetaminophen, prochlorperazine, sodium chloride flush, potassium chloride, magnesium sulfate, magnesium hydroxide, Saline Mouthwash, alteplase, LORazepam, morphine **OR** morphine    ROS:  As noted above, otherwise remainder of 10-point ROS negative    Physical Exam:     I&O:      Intake/Output Summary (Last 24 hours) at 01/25/2018 0826  Last data filed at 01/25/2018 0753  Gross per 24 hour   Intake 5683 ml   Output 2200 ml   Net 3483 ml       Vital Signs:   BP (!) 123/96    Pulse 115    Temp 97.6 ??F (36.4 ??C) (Oral)    Resp 21    Ht 5' 9"  (1.753 m)    Wt 200 lb 2.8 oz (90.8 kg)    SpO2 98%    BMI 29.56 kg/m??     Weight:    Wt Readings from Last 3 Encounters:   01/25/18 200 lb 2.8 oz (90.8 kg)   01/18/18 194 lb 9.6 oz (88.3 kg)   01/10/18 194 lb 12.8 oz (88.4 kg)         ??  General: Awake, alert and oriented.  HEENT:??normocephalic, alopecia,??PERRL, no scleral erythema or icterus, Oral mucosa moist and intact  LYMPH: ??Left axillary lymphadenopathy??  NECK: supple without palpable adenopathy  BACK: Straight negative CVAT  SKIN:??warm dry and intact without lesions rashes or masses  CHEST:??rhonchi w/ exp wheeze,??without use of accessory muscles  CV: Normal S1 S2, irregular, no MRG  ABD:??NT ND normoactive BS, no palpable masses or hepatosplenomegaly  EXTREMITIES:??without edema, denies calf tenderness  NEURO: CN II - XII grossly intact  CATHETER:??Right IJ PAC (06/21/17, [facility])??- CDI        Data    CBC:   Recent Labs     01/23/18  0340 01/24/18  0410 01/25/18  0415   WBC 0.1* 0.1* 0.1*   HGB 10.1* 9.0* 7.6*   HCT 30.4* 26.7* 22.7*   MCV 95.0 93.3 94.0   PLT 20* 18* 13*     BMP/Mag:  Recent Labs     01/23/18  0340 01/24/18  0410 01/25/18  0415   NA 132* 136 135*   K 4.2 4.5 4.1   CL 97* 101 102   CO2 21 23 24    BUN 13 21* 23*   CREATININE 0.7* 0.6* 0.6*     LIVP: No results for input(s): AST, ALT, LIPASE, BILIDIR, BILITOT, ALKPHOS in the last 72 hours.    Invalid input(s):  AMYLASE,  ALB  Coags: No results for input(s): PROTIME, INR, APTT in the last 72 hours.  Uric Acid No results for input(s): LABURIC in the last 72 hours.    Diagnostics:  1. ??PET scan (01/10/18):    ??    2.  CTA Chest (525/19):  1. ??No pulmonary embolism.  2. ??Patchy groundglass opacities and tree-in-bud are seen throughout the   lungs which are new compared to prior examination. ??This most likely   represents atypical infection/inflammation. ??Clinical correlation is   recommended.  3. ??Paraseptal and  centrilobular emphysematous change of the lungs.  4. ??Enlarged left axillary lymph nodes are not significantly changed   compared to PET/CT on 01/10/2018. ??This is worrisome for nodal metastasis.  5. ??Scattered lucent lesions are seen throughout the ribs, spine, and   scapula. ??Findings may represent multiple myeloma or osseous metastasis.    3.  Echocardiogram (01/23/18):  ??Technically difficult examination.  ??Normal left ventricle size, wall thickness, and systolic function with an  ??estimated ejection fraction of 50-55%. No obvious regional wall motion  ??abnormalities are seen (inferior and inferoseptal walls do appear normal, as  ??clinically questioned). Diastolic filling parameters suggests normal  ??diastolic function.  ??Aortic valve appears sclerotic but opens adequately.  ??Possible small/organized pericardial effusion though fat pad cannot be ruled  ??out.    Myeloma Labs (01/03/18):  SPEP:????reveals that M2, previously characterized as monoclonal IgG kappa in  mid-to-slow gamma is 1.0 gm/dL, greater than the 0.2 gm/dL observed on 08 Nov 2017.??  SIFE:????Serum immunofixation electrophoresis reveals persistent M2 in mid-to-slow gamma, a monoclonal IgG kappa. ??A recent M-spike, M1, was minor monoclonal IgG lambda in mid-gamma; M1 continues to be absent.  SFLC:  Kappa:  30.80, previously (11/08/17) -??10.40  Lambda:  1.46, previously (11/08/17) -??5.97  Free Kappa Lambda Ratio:  21.10, previously (11/08/17) -??1.74  Quantitative Immunoglobulins:  IgG:??  1480, previously (11/08/17) - 552  IgA:??  <26, previously (11/08/17) -??30  IgM:??  <20  ??  PROBLEM LIST: ??????????   ????  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 ??COPD   9.????Influenza A??(10/12/17)  10.  COPD Exacerbation (12/2017)  11.  Pericarditis (12/2017)  12.  A.  Fib w/ RVR (12/2017)  ????  TREATMENT: ??????????   ??  1. Rad Tx to T5-7, T10-L3, Right Scapula - 3000 cGy - Dr. Jamas Lav 03/20/16-04/01/16  2. RVD x1 03/20/16 - discontinued d/t  rash   3. Velcade/Pomalyst/Dex x3 cycles 04/23/16-06/17/16 - discontinued d/t reaction to pomalyst  4. Velcade/Dex x 1 cycle 06/26/16 (last dose of dexamethasone 07/22/16  5. High-dose melphalan followed by administration of PBSCs 2.36 x10^6 cd34cells/kg on 08/28/16  6. Maintenance Revlimid 68m daily (12/2016-04/23/17)  7. Dexamethasone 422mdaily (04/24/17)  8. DCEP   Cycle #1 - 04/26/17  Cycle #2 - 05/25/17 - excellent response  9.??Dara/Velcade/Dex (C1D1 - 06/22/17) - BMBx ??10/18/17 - CR  Cycles 7-8 (21 day cycle)  - Dex 20 mg po D4,5.8,9,11 and 12  - Dara 16 mg/Kg D1 only with Dex 20 mg IV  - Velcade 1.3 mg/M2 D1,4,8 and 11  Cycle 9 and beyond (maintenance, 11/08/17??- 01/03/18)  Dara 16 mg/Kg Q 28 days  Dex 12 mg IV D1  Velcade Q 2 weeks??  10. DCEP   Cycle #1 - 01/13/18  ??  ASSESSMENT AND PLAN:??????????   ??  1. ??IgG kappa multiple myeloma / Plasma Cell Leukemia:??Relapsed disease??  - S/p??treatment??Dara/Velcade/Dex??x 8 cycles??(06/22/17 - 10/2017). Followed by??maintenance Dara??Q4 wks/Velcade??Q2ks/Dex??(started 11/08/17)  - Now w/ progressive disease based on myeloma labs (01/03/18) & PET scan (01/10/18), see above??  ??  PLAN:??DCEP for disease and pain control. Hope for allogeneic transplant in CR2, but needs to disease response and improvement in lung function??  ??  DCEP Cycle #1, Day + 13  ??  2. ??ID: Afebrile w/ possibly multifocal PNA, unknown organism and pericarditis  - Cont Valtrex and Fluconazole ppx   - Cont Meropenem Day + 4  - Eraxis D +1 (fungitell and galatomanin)    ??  3. Heme:??Chemotherapy induced pancytopenia   - Transfuse for Hgb < 7 and Platelets <??10K  - No transfusion today  - S/p Neulasta (01/19/18)  ??  4.??Metabolic: Stable renal fxn and e-lytes??except for hypoNa &??steroid-induced hyperglycemia   - Start high regimen Lispro SSI, AC&HS  - Lower IVF:  NS at 50  mL/hr  - Keep Mg > 2 and K+ > 4.0  - Lasix 40 mg IVP  ??  5. Pulmonary:??Acute respiratory distress with mild hypercapnia and hypoxemia from COPD exacerbation, may also have PNA, unknown organism.  This developed after tapering off of steroids (01/20/18) for previous COPD exacerbation.   - Pulm Nodule: Stable??on??CT chest from??04/23/17??&??10/18/17 &??11/26/17; cont to monitor.   - CTPA??(11/26/17): No PE;??resolution of??mild upper lobe tree-in-bud opacities; Mild emphysema. Two 3 mm LLL pulmonary nodules, stable  - PFT (12/27/17):????compared to??07/2016:??FVC 3.25 L, 66%, and FEV1  1.42 L, 38%. FEV1/FVC is 44%. This is unchanged from prior study. Air trapping seen on lung volumes. Diffusion capacity 3.91, 79% of predicted, down from 5.21??  - CTPA (01/22/18):  No PE, patchy groundglass opacities and tree-in-bud are seen throughout the  lungs which are new compared to prior examination.   - Pulm following, appreciate recs. Cont regular f/u w/ Dr. BlDorann Lodgen d/c  - Cont home inhalers   - Cont Xopenox q4hrs  - Cont Solumedrol 80 mg TID (started 01/22/18) - addressing COPD & pericarditis  ??  6. GI/Nutrition: Appetite and oral intake is good??  - Cont PPI ppx w/ steroids??  - Cont low microbial diet ??  Constipation: ??Improved  - Cont Dulcolax prn & Senokot-s 2 tabs daily    ??  7. Cardiac:??Pericarditis, A fib with RVR  - H/o HLD &??has septal wall defect on echocardiogram.  - Cardiology following, appreciate recs  - Cont Lipitor??  - Cont Amiodarone & Lopressor 25 mg bid  - Cont Solumedrol  for pericarditis & COPD exacerbation; as tapered and if counts improve, consider colchicine d/t high relapse of pericarditis when steroids tapered  ??  8. Psych:  Concerned about disease progression and ability to get transplant   Anxiety/Depression: Ongoing  - Cont Zoloft??50 mg daily??  ??Insomnia:??No complaints??currently??  ??  9. Peripheral Neuropathy:??Ongoing  - Cont Gabapentin 300 mg??TID  ??  10. Bone Health:??No acute fx   -??H/o spinal cord compression &  diffuse lytic lesions t/o skeleton  - CT Chest (04/23/17) - multiple lytic lesions throughout the skeleton   - Cont Ca/Vit D &??Zometa monthly (given??01/03/18, next due??01/31/18)  ??  11.??Acute on??Chronic left lower back pain d/t Neoplasm:????Improved w/ chemotherapy  - S/p Radiation (09/01/17 - 09/15/17,??Fried)  - Cont Fentanyl patch??50 mcg/hr (increased 01/13/18) - Rx 01/18/18  - Cont Percocet 7.5/325 mg q 4hrs prn      - DVT Prophylaxis: Platelets <50,000 cells/dL - prophylactic lovenox on hold and mechanical prophylaxis with bilateral SCDs while in bed in place.  Contraindications to pharmacologic prophylaxis: Thrombocytopenia  Contraindications to mechanical prophylaxis: None    - Disposition: Once breathing and chest pain improves       Wayland Salinas, APRN - CNP   Harlene Salts, MD  [facility]  Please contact me through [place]

## 2018-01-25 NOTE — Care Coordination-Inpatient (Signed)
[facility] attempted to call patient 5/21 through readmission. May attempt again once d/c from [place]. Electronically signed by Jerline Pain, LPN on 3/79/0240 at 97:35 AM

## 2018-01-25 NOTE — Plan of Care (Signed)
Problem: Falls - Risk of:  Goal: Will remain free from falls  Description  Will remain free from falls  Outcome: Ongoing  Note:   Pt is a High fall risk. See Lattie Corns Fall Score and ABCDS Injury Risk assessments. Explained fall risk precautions to pt and family and rationale behind their use to keep the patient safe. Pt bed is in low position, side rails up, call light and belongings are in reach. Fall wristband applied and present on pts wrist.  Bed alarm on.  Pt encouraged to call for assistance. Will continue with hourly rounds for PO intake, pain needs, toileting and repositioning as needed.    Goal: Absence of physical injury  Description  Absence of physical injury  Outcome: Ongoing     Problem: Pain:  Goal: Pain level will decrease  Description  Pain level will decrease  Outcome: Ongoing  Note:   Pt complaining of chest/flank pain. Receiving morphine and percocet as needed for pain.   Goal: Control of acute pain  Description  Control of acute pain  Outcome: Ongoing  Goal: Control of chronic pain  Description  Control of chronic pain  Outcome: Ongoing     Problem: Nutrition  Goal: Optimal nutrition therapy  Outcome: Ongoing

## 2018-01-25 NOTE — Care Coordination-Inpatient (Signed)
Type of Admission  Multiple Myeloma/ Plasma Cell Leukemia  C1 D13 DCEP  Admitted with SOB        Central venous catheter  Right Single Lumen Port ( 01/12/18)        Plan          Update  01/25/18:n  Admitted through the Emergency Room with acute shortness of breath. Now being treated with acute pericarditis.  States he is feeling better.  Out of bed to chair, audible wheezing.  Currently of a Amiofdarone infusion.          Education          Discharge          Pending

## 2018-01-26 LAB — HEPATIC FUNCTION PANEL
ALT: 62 U/L — ABNORMAL HIGH (ref 10–40)
AST: 22 U/L (ref 15–37)
Albumin: 2.8 g/dL — ABNORMAL LOW (ref 3.4–5.0)
Alkaline Phosphatase: 91 U/L (ref 40–129)
Bilirubin, Direct: <0.2 mg/dL (ref 0.0–0.3)
Total Bilirubin: 0.3 mg/dL (ref 0.0–1.0)
Total Protein: 6 g/dL — ABNORMAL LOW (ref 6.4–8.2)

## 2018-01-26 LAB — EKG 12-LEAD
Atrial Rate: 300 {beats}/min
Q-T Interval: 290 ms
QRS Duration: 86 ms
QTc Calculation (Bazett): 465 ms
R Axis: 72 degrees
T Axis: 64 degrees
Ventricular Rate: 155 {beats}/min

## 2018-01-26 LAB — POCT GLUCOSE
POC Glucose: 265 mg/dl — ABNORMAL HIGH (ref 70–99)
POC Glucose: 221 mg/dl — ABNORMAL HIGH (ref 70–99)
POC Glucose: 259 mg/dl — ABNORMAL HIGH (ref 70–99)
POC Glucose: 267 mg/dl — ABNORMAL HIGH (ref 70–99)

## 2018-01-26 LAB — BASIC METABOLIC PANEL
Anion Gap: 11 (ref 3–16)
BUN: 25 mg/dL — ABNORMAL HIGH (ref 7–20)
CO2: 24 mmol/L (ref 21–32)
Calcium: 8.4 mg/dL (ref 8.3–10.6)
Chloride: 100 mmol/L (ref 99–110)
Creatinine: <0.5 mg/dL — ABNORMAL LOW (ref 0.9–1.3)
GFR African American: >60 (ref >60)
GFR Non-African American: >60 (ref >60)
Glucose: 242 mg/dL — ABNORMAL HIGH (ref 70–99)
Potassium: 4.1 mmol/L (ref 3.5–5.1)
Sodium: 135 mmol/L — ABNORMAL LOW (ref 136–145)

## 2018-01-26 LAB — CBC WITH AUTO DIFFERENTIAL
Hematocrit: 23.9 % — ABNORMAL LOW (ref 40.5–52.5)
Hemoglobin: 8 g/dL — ABNORMAL LOW (ref 13.5–17.5)
MCH: 31.2 pg (ref 26.0–34.0)
MCHC: 33.4 g/dL (ref 31.0–36.0)
MCV: 93.3 fL (ref 80.0–100.0)
MPV: 9.8 fL (ref 5.0–10.5)
Platelets: 12 10*3/uL — CL (ref 135–450)
RBC: 2.56 M/uL — ABNORMAL LOW (ref 4.20–5.90)
RDW: 18.1 % — ABNORMAL HIGH (ref 12.4–15.4)
WBC: 0.2 10*3/uL — CL (ref 4.0–11.0)

## 2018-01-26 LAB — MAGNESIUM: Magnesium: 2.1 mg/dL (ref 1.80–2.40)

## 2018-01-26 LAB — LACTATE DEHYDROGENASE: LD: 133 U/L (ref 100–190)

## 2018-01-26 LAB — PHOSPHORUS: Phosphorus: 1.9 mg/dL — ABNORMAL LOW (ref 2.5–4.9)

## 2018-01-26 LAB — URIC ACID: Uric Acid, Serum: 2 mg/dL — ABNORMAL LOW (ref 3.5–7.2)

## 2018-01-26 MED ORDER — AMIODARONE HCL 200 MG PO TABS
200 MG | Freq: Three times a day (TID) | ORAL | Status: AC
Start: 2018-01-26 — End: 2018-01-29
  Administered 2018-01-26 – 2018-01-30 (×12): 400 mg via ORAL

## 2018-01-26 MED ORDER — FUROSEMIDE 10 MG/ML IJ SOLN
10 MG/ML | Freq: Every day | INTRAMUSCULAR | Status: DC
Start: 2018-01-26 — End: 2018-01-31
  Administered 2018-01-26 – 2018-01-30 (×5): 40 mg via INTRAVENOUS

## 2018-01-26 MED ORDER — MORPHINE SULFATE 4 MG/ML IJ SOLN
4 MG/ML | INTRAMUSCULAR | Status: DC | PRN
Start: 2018-01-26 — End: 2018-01-31
  Administered 2018-01-26 – 2018-01-31 (×20): 4 mg via INTRAVENOUS

## 2018-01-26 MED ORDER — CELECOXIB 100 MG PO CAPS
100 MG | Freq: Two times a day (BID) | ORAL | Status: DC
Start: 2018-01-26 — End: 2018-02-08
  Administered 2018-01-26 – 2018-02-08 (×27): 100 mg via ORAL

## 2018-01-26 MED ORDER — MORPHINE SULFATE (PF) 2 MG/ML IV SOLN
2 MG/ML | INTRAVENOUS | Status: DC | PRN
Start: 2018-01-26 — End: 2018-01-31
  Administered 2018-01-26 – 2018-01-27 (×2): 2 mg via INTRAVENOUS

## 2018-01-26 MED ORDER — METHYLPREDNISOLONE SODIUM SUCC 40 MG IJ SOLR
40 MG | Freq: Two times a day (BID) | INTRAMUSCULAR | Status: AC
Start: 2018-01-26 — End: 2018-02-02
  Administered 2018-01-26 – 2018-02-02 (×15): 40 mg via INTRAVENOUS

## 2018-01-26 MED FILL — CELEBREX 100 MG PO CAPS: 100 mg | ORAL | Qty: 1

## 2018-01-26 MED FILL — LEVALBUTEROL HCL 1.25 MG/0.5ML IN NEBU: 1.25 MG/0.5ML | RESPIRATORY_TRACT | Qty: 1

## 2018-01-26 MED FILL — METOPROLOL TARTRATE 25 MG PO TABS: 25 mg | ORAL | Qty: 1

## 2018-01-26 MED FILL — VALACYCLOVIR HCL 500 MG PO TABS: 500 mg | ORAL | Qty: 1

## 2018-01-26 MED FILL — AMIODARONE HCL 150 MG/3ML IV SOLN: 150 MG/3ML | INTRAVENOUS | Qty: 9

## 2018-01-26 MED FILL — MORPHINE SULFATE 2 MG/ML IJ SOLN: 2 mg/mL | INTRAMUSCULAR | Qty: 1

## 2018-01-26 MED FILL — SERTRALINE HCL 50 MG PO TABS: 50 mg | ORAL | Qty: 1

## 2018-01-26 MED FILL — DILTIAZEM HCL 125 MG/25ML IV SOLN: 125 MG/25ML | INTRAVENOUS | Qty: 25

## 2018-01-26 MED FILL — MORPHINE SULFATE 4 MG/ML IJ SOLN: 4 mg/mL | INTRAMUSCULAR | Qty: 1

## 2018-01-26 MED FILL — BUDESONIDE 0.25 MG/2ML IN SUSP: 0.25 MG/2ML | RESPIRATORY_TRACT | Qty: 2

## 2018-01-26 MED FILL — MEROPENEM 1 G IV SOLR: 1 g | INTRAVENOUS | Qty: 1

## 2018-01-26 MED FILL — AMIODARONE HCL 200 MG PO TABS: 200 mg | ORAL | Qty: 2

## 2018-01-26 MED FILL — PANTOPRAZOLE SODIUM 40 MG PO TBEC: 40 mg | ORAL | Qty: 1

## 2018-01-26 MED FILL — FENTANYL 50 MCG/HR TD PT72: 50 ug/h | TRANSDERMAL | Qty: 1

## 2018-01-26 MED FILL — METHYLPREDNISOLONE SODIUM SUCC 40 MG IJ SOLR: 40 mg | INTRAMUSCULAR | Qty: 40

## 2018-01-26 MED FILL — FUROSEMIDE 10 MG/ML IJ SOLN: 10 mg/mL | INTRAMUSCULAR | Qty: 4

## 2018-01-26 MED FILL — SENNOSIDES-DOCUSATE SODIUM 8.6-50 MG PO TABS: 8.6-50 mg | ORAL | Qty: 2

## 2018-01-26 MED FILL — OYSTER SHELL CALCIUM 500 MG PO TABS: 500 mg | ORAL | Qty: 1

## 2018-01-26 MED FILL — POTASSIUM PHOSPHATES 45 MMOLE/15ML IV SOLN: 45 MMOLE/15ML | INTRAVENOUS | Qty: 6.67

## 2018-01-26 MED FILL — GABAPENTIN 300 MG PO CAPS: 300 mg | ORAL | Qty: 1

## 2018-01-26 MED FILL — SODIUM CHLORIDE 0.9 % IV SOLN: 0.9 % | INTRAVENOUS | Qty: 1000

## 2018-01-26 MED FILL — ERAXIS 100 MG IV SOLR: 100 mg | INTRAVENOUS | Qty: 30

## 2018-01-26 MED FILL — LIPITOR 20 MG PO TABS: 20 mg | ORAL | Qty: 1

## 2018-01-26 MED FILL — PERFOROMIST 20 MCG/2ML IN NEBU: 20 MCG/2ML | RESPIRATORY_TRACT | Qty: 2

## 2018-01-26 NOTE — Care Coordination-Inpatient (Signed)
Type of Admission  Multiple Myeloma/ Plasma Cell Leukemia  C1 D14 DCEP  Admitted with SOB        Central venous catheter  Right Single Lumen Port ( 01/12/18)        Plan          Update  01/25/18:n  Admitted through the Emergency Room with acute shortness of breath. Now being treated with acute pericarditis.  States he is feeling better.  Out of bed to chair, audible wheezing.  Currently of a Amiofdarone infusion.  01/26/18: Walked to bathroom & became very dyspneic & congested.  Assisted back to bed with the assist of 2 nurses.  Medicated with IV Morphine.          Education          Discharge          Pending

## 2018-01-26 NOTE — Plan of Care (Signed)
Problem: Falls - Risk of:  Goal: Will remain free from falls  Description  Will remain free from falls  01/26/2018 1828 by Volney American, RN  Outcome: Ongoing  Note:   Orthostatic vital signs obtained at start of shift - see flowsheet for details.  Pt does not meet criteria for orthostasis.  Pt is a High fall risk. See Lattie Corns Fall Score and ABCDS Injury Risk assessments.   + Screening for Orthostasis and/or + High Fall Risk per MORSE/ABCDS: Explained fall risk precautions to pt and family and rationale behind their use to keep the patient safe. Pt bed is in low position, side rails up, call light and belongings are in reach. Fall wristband applied and present on pts wrist.  Bed alarm on.  Pt encouraged to call for assistance. Will continue with hourly rounds for PO intake, pain needs, toileting and repositioning as needed.            Problem: Pain:  Goal: Pain level will decrease  Description  Pain level will decrease  Outcome: Ongoing  Note:   Pt complaining of right side flank pain. Gave morphine as ordered. See MAR.      Problem: Cardiac:  Goal: Ability to maintain vital signs within normal range will improve  Description  Ability to maintain vital signs within normal range will improve  01/26/2018 1828 by Volney American, RN  Outcome: Ongoing  Note:   Pt v/s stable  at this time. Will continue to monitor.      Problem: Bleeding:  Goal: Will show no signs and symptoms of excessive bleeding  Description  Will show no signs and symptoms of excessive bleeding  01/26/2018 1828 by Volney American, RN  Outcome: Ongoing  Note:   Patient's hemoglobin this AM:   Recent Labs     01/26/18  0330   HGB 8.0*     Patient's platelet count this AM:   Recent Labs     01/26/18  0330   PLT 12*    Thrombocytopenia Precautions in place.  Patient showing no signs or symptoms of active bleeding.  Transfusion not indicated at this time.  Patient verbalizes understanding of all instructions. Will continue to assess and implement POC.  Call light within reach and hourly rounding in place.

## 2018-01-26 NOTE — Plan of Care (Signed)
Problem: Falls - Risk of:  Goal: Will remain free from falls  Outcome: Ongoing   Pt is a High fall risk. See Lattie Corns Fall Score and ABCDS Injury Risk assessments.     Problem: Cardiac:  Goal: Ability to maintain vital signs within normal range will improve  Outcome: Ongoing   Patient remains in a-fib this shift MDs aware of this rhythm. Amio and cardizem drip in place as well as oral metoprolol. Blood pressure stable.       Problem: Bleeding:  Goal: Will show no signs and symptoms of excessive bleeding  Outcome: Ongoing   Patient's hemoglobin this AM:   Recent Labs     01/26/18  0330   HGB 8.0*     Patient's platelet count this AM:   Recent Labs     01/26/18  0330   PLT 12*    Thrombocytopenia Precautions in place.  Patient showing no signs or symptoms of active bleeding.  Transfusion not indicated at this time.  Patient verbalizes understanding of all instructions. Will continue to assess and implement POC. Call light within reach and hourly rounding in place.

## 2018-01-26 NOTE — Progress Notes (Signed)
CC: COPD exacerbation    Complaining of right-sided chest pain that radiates circumferentially but has a particular focus anteriorly.  Coughing productively.  He isn't sure that he is any less short of breath, because his chest wall pain limits his breathing significantly.  Afebrile  WBC remains 0.2..  Platelets 12  Continuing meropenem empirically for neutropenia, anidulafungin added.  Fungitel and galactomannan studies pending  BP 137/91  Atrial fibrillation, rate 110-120  Heart sounds decreased  O2 saturation 98% on O2 at 2 L/m  Breath sounds moderately decreased, only a few scattered wheezes that are medium pitched.  Right anterior chest wall tenderness  Abdomen soft, no tenderness.  No peripheral edema.  Fluid balance positive by I/O  Creatinine 0.5.  Electrolytes normal.  Glucose 265    A&P:  Multiple myeloma, on active treatment, with neutropenia  Exacerbation of COPD continues to improve.  He exhibits better airflow.  Tapering steroid dose.  Chest wall pain secondary to cough and increased use of respiratory muscles.  This is inhibiting breathing and adding to sense of dyspnea.  Discussed use of NSAID with Dr. Hunt Oris  Atrial fibrillation, rate control not quite as good today.  Renal function remains intact.  Hemodynamically stable

## 2018-01-26 NOTE — Progress Notes (Addendum)
David Watkins Progress Note    01/26/2018     David Watkins    MRN: [id number]    DOB: February 10, 1956      SUBJECTIVE:  Persistent dyspnea left sided pleuritic chest substernal chest pain.    ECOG PS:  (2) Ambulatory and capable of self care, unable to carry out work activity, up and about > 50% or waking hours    Isolation: None    Medications    Scheduled Meds:  ??? insulin lispro  0-18 Units Subcutaneous TID WC   ??? insulin lispro  0-9 Units Subcutaneous Nightly   ??? anidulafungin  100 mg Intravenous Q24H   ??? methylPREDNISolone  40 mg Intravenous Q8H   ??? metoprolol tartrate  25 mg Oral BID   ??? atorvastatin  10 mg Oral Nightly   ??? calcium elemental  500 mg Oral BID   ??? fentaNYL  1 patch Transdermal Q72H   ??? gabapentin  300 mg Oral TID   ??? pantoprazole  40 mg Oral QAM AC   ??? sennosides-docusate sodium  2 tablet Oral Daily   ??? sertraline  50 mg Oral Daily   ??? valACYclovir  500 mg Oral BID   ??? meropenem  1 g Intravenous Q8H   ??? sodium chloride flush  10 mL Intravenous 2 times per day   ??? Saline Mouthwash  15 mL Swish & Spit 4x Daily AC & HS   ??? tiotropium  18 mcg Inhalation Daily   ??? budesonide  0.25 mg Nebulization BID    And   ??? formoterol  20 mcg Nebulization BID   ??? levalbuterol  1.25 mg Nebulization Q4H WA     Continuous Infusions:  ??? dextrose     ??? amiodarone 0.5 mg/min (01/25/18 1521)   ??? diltiazem (CARDIZEM) 125 mg in dextrose 5% 125 mL infusion 5 mg/hr (01/25/18 1650)   ??? sodium chloride 50 mL/hr at 01/25/18 1051     PRN Meds:.glucose, dextrose, glucagon (rDNA), dextrose, potassium chloride, magnesium sulfate, potassium phosphate IVPB, LORazepam, albuterol, bisacodyl, ondansetron, oxyCODONE-acetaminophen, prochlorperazine, sodium chloride flush, potassium chloride, magnesium sulfate, magnesium hydroxide, Saline Mouthwash, alteplase, LORazepam, morphine **OR** morphine    ROS:  As noted above, otherwise remainder of 10-point ROS negative    Physical Exam:     I&O:      Intake/Output Summary (Last 24 hours) at 01/26/2018  0733  Last data filed at 01/26/2018 0543  Gross per 24 hour   Intake 3091 ml   Output 3025 ml   Net 66 ml       Vital Signs:  BP (!) 133/94    Pulse 111    Temp 97.9 ??F (36.6 ??C) (Oral)    Resp 14    Ht 5' 9"  (1.753 m)    Wt 200 lb 2.8 oz (90.8 kg)    SpO2 98%    BMI 29.56 kg/m??     Weight:    Wt Readings from Last 3 Encounters:   01/25/18 200 lb 2.8 oz (90.8 kg)   01/18/18 194 lb 9.6 oz (88.3 kg)   01/10/18 194 lb 12.8 oz (88.4 kg)         ??  General: Awake, alert and oriented.  HEENT:??normocephalic, alopecia,??PERRL, no scleral erythema or icterus, Oral mucosa moist and intact  LYMPH: ??Left axillary lymphadenopathy??  NECK: supple without palpable adenopathy  BACK: Straight negative CVAT  SKIN:??warm dry and intact without lesions rashes or masses  CHEST:??rhonchi w/ exp wheeze,??without use of accessory muscles  CV:  Normal S1 S2, irregular, no MRG  ABD:??NT ND normoactive BS, no palpable masses or hepatosplenomegaly  EXTREMITIES:??without edema, denies calf tenderness  NEURO: CN II - XII grossly intact  CATHETER:??Right IJ PAC (06/21/17, [facility])??- CDI        Data    CBC:   Recent Labs     01/24/18  0410 01/25/18  0415 01/26/18  0330   WBC 0.1* 0.1* 0.2*   HGB 9.0* 7.6* 8.0*   HCT 26.7* 22.7* 23.9*   MCV 93.3 94.0 93.3   PLT 18* 13* 12*     BMP/Mag:  Recent Labs     01/24/18  0410 01/25/18  0415 01/26/18  0330   NA 136 135* 135*   K 4.5 4.1 4.1   CL 101 102 100   CO2 23 24 24    PHOS  --  1.6* 1.9*   BUN 21* 23* 25*   CREATININE 0.6* 0.6* <0.5*   MG  --  1.90 2.10     LIVP:   Recent Labs     01/25/18  0415 01/26/18  0330   AST 26 22   ALT 54* 62*   BILIDIR <0.2 <0.2   BILITOT 0.3 0.3   ALKPHOS 86 91     Coags:   Recent Labs     01/25/18  0835   PROTIME 13.5*   INR 1.18*   APTT 27.2     Uric Acid   Recent Labs     01/25/18  0415 01/26/18  0330   LABURIC 2.1* 2.0*       Diagnostics:  1. ??PET scan (01/10/18):    ??    2.  CTA Chest (525/19):  1. ??No pulmonary embolism.  2. ??Patchy groundglass opacities and tree-in-bud are  seen throughout the   lungs which are new compared to prior examination. ??This most likely   represents atypical infection/inflammation. ??Clinical correlation is   recommended.  3. ??Paraseptal and centrilobular emphysematous change of the lungs.  4. ??Enlarged left axillary lymph nodes are not significantly changed   compared to PET/CT on 01/10/2018. ??This is worrisome for nodal metastasis.  5. ??Scattered lucent lesions are seen throughout the ribs, spine, and   scapula. ??Findings may represent multiple myeloma or osseous metastasis.    3.  Echocardiogram (01/23/18):  ??Technically difficult examination.  ??Normal left ventricle size, wall thickness, and systolic function with an  ??estimated ejection fraction of 50-55%. No obvious regional wall motion  ??abnormalities are seen (inferior and inferoseptal walls do appear normal, as  ??clinically questioned). Diastolic filling parameters suggests normal  ??diastolic function.  ??Aortic valve appears sclerotic but opens adequately.  ??Possible small/organized pericardial effusion though fat pad cannot be ruled  ??out.    Myeloma Labs (01/03/18):  SPEP:????reveals that M2, previously characterized as monoclonal IgG kappa in  mid-to-slow gamma is 1.0 gm/dL, greater than the 0.2 gm/dL observed on 08 Nov 2017.??  SIFE:????Serum immunofixation electrophoresis reveals persistent M2 in mid-to-slow gamma, a monoclonal IgG kappa. ??A recent M-spike, M1, was minor monoclonal IgG lambda in mid-gamma; M1 continues to be absent.  SFLC:  Kappa:  30.80, previously (11/08/17) -??10.40  Lambda:  1.46, previously (11/08/17) -??5.97  Free Kappa Lambda Ratio:  21.10, previously (11/08/17) -??1.74  Quantitative Immunoglobulins:  IgG:??  1480, previously (11/08/17) - 552  IgA:??  <26, previously (11/08/17) -??30  IgM:??  <20  ??  PROBLEM LIST: ??????????   ????  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  ??  Anxiety/Depression  4.????Hyperlipidemia  5.????Hypertension  6.????Insomnia  7.????Chronic low??back pain??d/t neoplasm (h/o lytic lesions??&??cord compression @??T6 & T11)  8. ??COPD   9.????Influenza A??(10/12/17)  10.  COPD Exacerbation (12/2017)  11.  Pericarditis (12/2017)  12.  A. Fib w/ RVR (12/2017)  ????  TREATMENT: ??????????   ??  1. Rad Tx to T5-7, T10-L3, Right Scapula - 3000 cGy - Dr. Jamas Lav 03/20/16-04/01/16  2. RVD x1 03/20/16 - discontinued d/t rash   3. Velcade/Pomalyst/Dex x3 cycles 04/23/16-06/17/16 - discontinued d/t reaction to pomalyst  4. Velcade/Dex x 1 cycle 06/26/16 (last dose of dexamethasone 07/22/16  5. High-dose melphalan followed by administration of PBSCs 2.36 x10^6 cd34cells/kg on 08/28/16  6. Maintenance Revlimid 59m daily (12/2016-04/23/17)  7. Dexamethasone 484mdaily (04/24/17)  8. DCEP   Cycle #1 - 04/26/17  Cycle #2 - 05/25/17 - excellent response  9.??Dara/Velcade/Dex (C1D1 - 06/22/17) - BMBx ??10/18/17 - CR  Cycles 7-8 (21 day cycle)  - Dex 20 mg po D4,5.8,9,11 and 12  - Dara 16 mg/Kg D1 only with Dex 20 mg IV  - Velcade 1.3 mg/M2 D1,4,8 and 11  Cycle 9 and beyond (maintenance, 11/08/17??- 01/03/18)  Dara 16 mg/Kg Q 28 days  Dex 12 mg IV D1  Velcade Q 2 weeks??  10. DCEP   Cycle #1 - 01/13/18  ??  ASSESSMENT AND PLAN:??????????   ??  1. ??IgG kappa multiple myeloma / Plasma Cell Leukemia:??Relapsed disease??  - S/p??treatment??Dara/Velcade/Dex??x 8 cycles??(06/22/17 - 10/2017). Followed by??maintenance Dara??Q4 wks/Velcade??Q2ks/Dex??(started 11/08/17)  - Now w/ progressive disease based on myeloma labs (01/03/18) & PET scan (01/10/18), see above??  ??  PLAN:??DCEP for disease and pain control. Hope for allogeneic transplant in CR2, but needs to disease response and improvement in lung function??  ??  DCEP Cycle #1, Day + 14  ??  2. ??ID: Afebrile w/ possibly multifocal PNA, unknown organism and pericarditis. He is high risk for fungal PNA d/t long term steroids   - Fungitell and galactomannan (01/25/18) - Pending   - Cont Valtrex ppx  - Cont  Meropenem Day + 5  - Eraxis Day + 2     3. Heme:??Chemotherapy induced pancytopenia   - Transfuse for Hgb < 7 and Platelets <??10K  - No transfusion today  - S/p Neulasta (01/19/18)  ??  4.??Metabolic: Stable renal fxn and e-lytes??except for hypoNa &??steroid-induced hyperglycemia and hypoPhos  - Cont high regimen Lispro SSI, AC&HS  - Cont Lasix 40 mg IV daily (started 01/25/18)   - Cont IVF:  NS @ 50 mL/hr  - Keep Mg > 2 and K+ > 4.0 and Phos > 2.0    ??  5. Pulmonary:??Acute respiratory distress with mild hypercapnia and hypoxemia from COPD exacerbation, may also have PNA, unknown organism.  This developed after tapering off of steroids (01/20/18) for previous COPD exacerbation.   - Pulm Nodule: Stable??on??CT chest from??04/23/17??&??10/18/17 &??11/26/17; cont to monitor.   - CTPA??(11/26/17): No PE;??resolution of??mild upper lobe tree-in-bud opacities; Mild emphysema. Two 3 mm LLL pulmonary nodules, stable  - PFT (12/27/17):????compared to??07/2016:??FVC 3.25 L, 66%, and FEV1  1.42 L, 38%. FEV1/FVC is 44%. This is unchanged from prior study. Air trapping seen on lung volumes. Diffusion capacity 3.91, 79% of predicted, down from 5.21??  - CTPA (01/22/18):  No PE, patchy groundglass opacities and tree-in-bud are seen throughout the  lungs which are new compared to prior examination.   - Pulm following, appreciate recs. Cont regular f/u w/ Dr. BlDorann Lodgen d/c  - Cont home  inhalers   - Cont Xopenox q4hrs  - Cont Steroid taper: Solumedrol 80 mg TID (started 01/22/18), 40 mg TID (01/25/18), pulm tapering    ??  6. GI/Nutrition: Appetite and oral intake is good??  - Cont PPI ppx w/ steroids??  - Cont low microbial diet ??  Constipation: ??Improved  - Cont Dulcolax prn & Senokot-s 2 tabs daily    ??  7. Cardiac:??Pericarditis, A fib with RVR  - H/o HLD &??has septal wall defect on echocardiogram.  - Echo (01/23/18):  LVEF 50-55# w/ normal diastolic fxn & no obvious regional wall motion abnormalities  - Cardiology following, appreciate recs  - Cont Lipitor??  -  Cont Amiodarone & Lopressor 25 mg bid  - Cont Dilt gtt  - Cont Solumedrol COPD exacerbation & pericarditis; as tapered and if counts improve, consider colchicine d/t high relapse of pericarditis when steroids tapered  - Start Celebrex (started 01/26/18)  ??  8. Psych:  Ongoing concerns about disease progression and ability to get transplant   Anxiety/Depression: Ongoing  - Cont Zoloft??50 mg daily??  - Psych to follow   Insomnia:??No complaints??currently??  ??  9. Peripheral Neuropathy:??Ongoing  - Cont Gabapentin 300 mg??TID  ??  10. 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  11.??Acute on??Chronic left lower back pain d/t Neoplasm:????Improved w/ chemotherapy, but now w/ lateral rib pain/pleuritic pain  - S/p Radiation (09/01/17 - 09/15/17,??Fried)  - Cont Fentanyl patch??50 mcg/hr (increased 01/13/18, Rx 01/18/18)  - Cont Percocet 7.5/325 mg q4hrs prn      - DVT Prophylaxis: Platelets <50,000 cells/dL - prophylactic lovenox on hold and mechanical prophylaxis with bilateral SCDs while in bed in place.  Contraindications to pharmacologic prophylaxis: Thrombocytopenia  Contraindications to mechanical prophylaxis: None    - Disposition: Once breathing and chest pain improves       Wayland Salinas, APRN - CNP      Harlene Salts, MD  [facility]  Please contact me through [place]

## 2018-01-26 NOTE — Behavioral Health Treatment Team (Signed)
Psychology  Pt extremely anxious after becoming dyspneic coming from restroom to bed.  Pt kept repeating, "I'm not ready, don't let him (Dr. Lynnette Caffey) give up on me."  Assisted RNs in calming pt, who settled down considerably after morphine helped ease his air-hunger and anxiety.  Will see pt periodically through day to provide support.   Elizette Shek, Psy.D., ABPP

## 2018-01-26 NOTE — Progress Notes (Signed)
Still in a fib.  No angina but has L rib pain with cough, breathing.    PE:  Blood pressure (!) 133/94, pulse 111, temperature 97.9 ??F (36.6 ??C), temperature source Oral, resp. rate 14, height '5\' 9"'$  (1.753 m), weight 200 lb 2.8 oz (90.8 kg), SpO2 98 %.  General (appearance):  Alert and cooperative.   Eyes: Anicteric  Neck: Supple. No JVD  Ears/Nose/Mouth/Thorat: No cyanosis  CV: Irreg irreg. Tachycardic. No obvious m/r/g  Respiratory:  Mild wheezing  GI: Abd s/nt/nd. No peritoneal signs  Skin: Warm, dry. No rashes  Neuro/Psych: Alert and oriented x 3. Appropriate behavior  Ext:  No c/c. No pitting edema  Pulses:  2+ radial    Lab Results   Component Value Date    WBC 0.2 (LL) 01/26/2018    HGB 8.0 (L) 01/26/2018    HCT 23.9 (L) 01/26/2018    MCV 93.3 01/26/2018    PLT 12 (LL) 01/26/2018     Lab Results   Component Value Date    NA 135 (L) 01/26/2018    K 4.1 01/26/2018    CL 100 01/26/2018    CO2 24 01/26/2018    BUN 25 (H) 01/26/2018    CREATININE <0.5 (L) 01/26/2018    GLUCOSE 242 (H) 01/26/2018    CALCIUM 8.4 01/26/2018    PROT 6.0 (L) 01/26/2018    LABALBU 2.8 (L) 01/26/2018    BILITOT 0.3 01/26/2018    ALKPHOS 91 01/26/2018    AST 22 01/26/2018    ALT 62 (H) 01/26/2018    LABGLOM >60 01/26/2018    GFRAA >60 01/26/2018    AGRATIO 1.0 (L) 01/22/2018    GLOB 3.1 01/22/2018     Lab Results   Component Value Date    INR 1.18 (H) 01/25/2018    INR 1.36 (H) 01/22/2018    INR 1.08 01/17/2018    PROTIME 13.5 (H) 01/25/2018    PROTIME 15.5 (H) 01/22/2018    PROTIME 12.3 01/17/2018     Lab Results   Component Value Date    TROPONINI <0.01 01/22/2018     Lab Results   Component Value Date    ALT 62 (H) 01/26/2018    AST 22 01/26/2018    ALKPHOS 91 01/26/2018    BILITOT 0.3 01/26/2018     Lab Results   Component Value Date    ALT 62 (H) 01/26/2018    AST 22 01/26/2018    ALKPHOS 91 01/26/2018    BILITOT 0.3 01/26/2018       01/21/2018 TTE: EF 50-55%. No def RWMA (inferior and inferoseptum was normal). No sig valve  regurgitation or stenosis.    01/22/18 ECGs reviewed. Earlier ecg seemed to demonstrate acute inferior MI but Tns negative and further ecgs showed evolution other ST segments suggesting acute pericarditis.    01/21/2018 CTA Chest: No PE. Tree-in-bud bilateral opacities suggesting atypical infection/inflammation. Enlarged L axillary notes unchanged. Ribs, spine, and scapula lesions suggesting MM/bony mets. No sig pericardial effusion noted.    01/22/18 CXR: Hyperinflated lungs. No definite acute consolidation    A/P:  62 y.o. admitted with severe SOB.  -Acute pericarditis  -Multiple myeloma  -COPD  -Neutropenia  -Pneumonia with "tree in bud" appearance of CT chest  -Hyperlipidemia  -A fib with RVR    Recs:  -Change amio to 400 oral tid x 4 days. Sunday start 200 mg daily  -Cont dilt but at 10 mg/hour.  -No a/c given myeloma/thrombocytpenia  -K>4, Mg>2  Roderic Palau A. Bunnie Philips, MD, [facility], [facility]

## 2018-01-27 ENCOUNTER — Inpatient Hospital Stay: Admit: 2018-01-27 | Payer: BLUE CROSS/BLUE SHIELD | Primary: Hematology & Oncology

## 2018-01-27 LAB — CULTURE BLOOD #1: Blood Culture, Routine: NO GROWTH

## 2018-01-27 LAB — PHOSPHORUS: Phosphorus: 1.9 mg/dL — ABNORMAL LOW (ref 2.5–4.9)

## 2018-01-27 LAB — CULTURE, BLOOD 2: Culture, Blood 2: NO GROWTH

## 2018-01-27 LAB — POCT GLUCOSE
POC Glucose: 178 mg/dl — ABNORMAL HIGH (ref 70–99)
POC Glucose: 212 mg/dl — ABNORMAL HIGH (ref 70–99)
POC Glucose: 223 mg/dl — ABNORMAL HIGH (ref 70–99)
POC Glucose: 239 mg/dl — ABNORMAL HIGH (ref 70–99)
POC Glucose: 290 mg/dl — ABNORMAL HIGH (ref 70–99)

## 2018-01-27 LAB — PROTIME-INR
INR: 1.11 (ref 0.86–1.14)
Protime: 12.7 s (ref 9.8–13.0)

## 2018-01-27 LAB — BASIC METABOLIC PANEL
Anion Gap: 9 (ref 3–16)
BUN: 26 mg/dL — ABNORMAL HIGH (ref 7–20)
CO2: 26 mmol/L (ref 21–32)
Calcium: 7.9 mg/dL — ABNORMAL LOW (ref 8.3–10.6)
Chloride: 100 mmol/L (ref 99–110)
Creatinine: 0.6 mg/dL — ABNORMAL LOW (ref 0.9–1.3)
GFR African American: >60 (ref >60)
GFR Non-African American: >60 (ref >60)
Glucose: 210 mg/dL — ABNORMAL HIGH (ref 70–99)
Potassium: 4.1 mmol/L (ref 3.5–5.1)
Sodium: 135 mmol/L — ABNORMAL LOW (ref 136–145)

## 2018-01-27 LAB — PREPARE PLATELETS: Dispense Status Blood Bank: TRANSFUSED

## 2018-01-27 LAB — APTT: aPTT: 26 s (ref 26.0–36.0)

## 2018-01-27 LAB — CBC WITH AUTO DIFFERENTIAL
Hematocrit: 23.3 % — ABNORMAL LOW (ref 40.5–52.5)
Hemoglobin: 7.9 g/dL — ABNORMAL LOW (ref 13.5–17.5)
MCH: 31.7 pg (ref 26.0–34.0)
MCHC: 34.1 g/dL (ref 31.0–36.0)
MCV: 92.9 fL (ref 80.0–100.0)
MPV: 10.5 fL (ref 5.0–10.5)
Platelets: 10 10*3/uL — CL (ref 135–450)
RBC: 2.5 M/uL — ABNORMAL LOW (ref 4.20–5.90)
RDW: 18.3 % — ABNORMAL HIGH (ref 12.4–15.4)
WBC: 0.4 10*3/uL — CL (ref 4.0–11.0)

## 2018-01-27 LAB — MAGNESIUM: Magnesium: 2 mg/dL (ref 1.80–2.40)

## 2018-01-27 MED ORDER — DILTIAZEM HCL ER COATED BEADS 240 MG PO CP24
240 MG | Freq: Every day | ORAL | Status: DC
Start: 2018-01-27 — End: 2018-02-08
  Administered 2018-01-27 – 2018-02-08 (×13): 240 mg via ORAL

## 2018-01-27 MED ORDER — SODIUM CHLORIDE 0.9 % IV BOLUS
0.9 % | Freq: Once | INTRAVENOUS | Status: AC
Start: 2018-01-27 — End: 2018-01-27
  Administered 2018-01-27: 11:00:00 250 mL via INTRAVENOUS

## 2018-01-27 MED ORDER — DIPHENHYDRAMINE HCL 25 MG PO TABS
25 MG | ORAL | Status: DC | PRN
Start: 2018-01-27 — End: 2018-02-08
  Administered 2018-01-27 – 2018-02-03 (×2): 25 mg via ORAL

## 2018-01-27 MED ORDER — ACETAMINOPHEN 325 MG PO TABS
325 MG | ORAL | Status: DC | PRN
Start: 2018-01-27 — End: 2018-02-08
  Administered 2018-01-27 – 2018-02-03 (×2): 650 mg via ORAL

## 2018-01-27 MED ORDER — POTASSIUM PHOSPHATE 3 MMOL/ML IV SOLN (MIXTURES ONLY)
15 mmol/5 mL | Freq: Every day | INTRAVENOUS | Status: DC | PRN
Start: 2018-01-27 — End: 2018-02-08
  Administered 2018-01-28 – 2018-02-04 (×2): 30 mmol via INTRAVENOUS

## 2018-01-27 MED ORDER — IOPAMIDOL 76 % IV SOLN
76 % | Freq: Once | INTRAVENOUS | Status: AC | PRN
Start: 2018-01-27 — End: 2018-01-27
  Administered 2018-01-27: 16:00:00 80 mL via INTRAVENOUS

## 2018-01-27 MED FILL — OYSTER SHELL CALCIUM 500 MG PO TABS: 500 mg | ORAL | Qty: 1

## 2018-01-27 MED FILL — FUROSEMIDE 10 MG/ML IJ SOLN: 10 mg/mL | INTRAMUSCULAR | Qty: 4

## 2018-01-27 MED FILL — PANTOPRAZOLE SODIUM 40 MG PO TBEC: 40 mg | ORAL | Qty: 1

## 2018-01-27 MED FILL — MORPHINE SULFATE 4 MG/ML IJ SOLN: 4 mg/mL | INTRAMUSCULAR | Qty: 1

## 2018-01-27 MED FILL — ERAXIS 100 MG IV SOLR: 100 mg | INTRAVENOUS | Qty: 30

## 2018-01-27 MED FILL — SPIRIVA HANDIHALER 18 MCG IN CAPS: 18 ug | RESPIRATORY_TRACT | Qty: 5

## 2018-01-27 MED FILL — OXYCODONE-ACETAMINOPHEN 7.5-325 MG PO TABS: 7.5-325 mg | ORAL | Qty: 1

## 2018-01-27 MED FILL — BANOPHEN 25 MG PO TABS: 25 mg | ORAL | Qty: 1

## 2018-01-27 MED FILL — AMIODARONE HCL 200 MG PO TABS: 200 mg | ORAL | Qty: 2

## 2018-01-27 MED FILL — LEVALBUTEROL HCL 1.25 MG/0.5ML IN NEBU: 1.25 MG/0.5ML | RESPIRATORY_TRACT | Qty: 2

## 2018-01-27 MED FILL — VALACYCLOVIR HCL 500 MG PO TABS: 500 mg | ORAL | Qty: 1

## 2018-01-27 MED FILL — MEROPENEM 1 G IV SOLR: 1 g | INTRAVENOUS | Qty: 1

## 2018-01-27 MED FILL — ACETAMINOPHEN 325 MG PO TABS: 325 mg | ORAL | Qty: 2

## 2018-01-27 MED FILL — BUDESONIDE 0.25 MG/2ML IN SUSP: 0.25 MG/2ML | RESPIRATORY_TRACT | Qty: 2

## 2018-01-27 MED FILL — METOPROLOL TARTRATE 25 MG PO TABS: 25 mg | ORAL | Qty: 1

## 2018-01-27 MED FILL — SENNOSIDES-DOCUSATE SODIUM 8.6-50 MG PO TABS: 8.6-50 mg | ORAL | Qty: 2

## 2018-01-27 MED FILL — METHYLPREDNISOLONE SODIUM SUCC 40 MG IJ SOLR: 40 mg | INTRAMUSCULAR | Qty: 40

## 2018-01-27 MED FILL — GABAPENTIN 300 MG PO CAPS: 300 mg | ORAL | Qty: 1

## 2018-01-27 MED FILL — PERFOROMIST 20 MCG/2ML IN NEBU: 20 MCG/2ML | RESPIRATORY_TRACT | Qty: 2

## 2018-01-27 MED FILL — DILTIAZEM HCL 125 MG/25ML IV SOLN: 125 MG/25ML | INTRAVENOUS | Qty: 25

## 2018-01-27 MED FILL — LIPITOR 20 MG PO TABS: 20 mg | ORAL | Qty: 1

## 2018-01-27 MED FILL — SERTRALINE HCL 50 MG PO TABS: 50 mg | ORAL | Qty: 1

## 2018-01-27 MED FILL — MORPHINE SULFATE 2 MG/ML IJ SOLN: 2 mg/mL | INTRAMUSCULAR | Qty: 1

## 2018-01-27 MED FILL — CELEBREX 100 MG PO CAPS: 100 mg | ORAL | Qty: 1

## 2018-01-27 MED FILL — SODIUM CHLORIDE 0.9 % IV SOLN: 0.9 % | INTRAVENOUS | Qty: 250

## 2018-01-27 MED FILL — SODIUM CHLORIDE 0.9 % IV SOLN: 0.9 % | INTRAVENOUS | Qty: 1000

## 2018-01-27 MED FILL — POTASSIUM PHOSPHATES 45 MMOLE/15ML IV SOLN: 45 MMOLE/15ML | INTRAVENOUS | Qty: 6.67

## 2018-01-27 MED FILL — DILTIAZEM HCL ER COATED BEADS 240 MG PO CP24: 240 mg | ORAL | Qty: 1

## 2018-01-27 NOTE — Care Coordination-Inpatient (Signed)
SW tried to talk with patient today but he was napping.  SW talked with patient's wife who was working while with patient.  She said they did not need anything at this time. Patient is currently on oxygen, which is new for patient.  [facility] had tried to see patient after discharge from the hospital last week.  Patient and SO did not return [facility] phone calls.  Will readdress with patient.    Freddi Starr, MSW, [place]

## 2018-01-27 NOTE — Progress Notes (Signed)
Returned from CT scan

## 2018-01-27 NOTE — Progress Notes (Signed)
[name] Progress Note    01/27/2018     David Watkins    MRN: [id number]    DOB: [birth year]      SUBJECTIVE:  Breathing better today with less pain.    ECOG PS:  (2) Ambulatory and capable of self care, unable to carry out work activity, up and about > 50% or waking hours    Isolation: None    Medications    Scheduled Meds:  ??? sodium chloride  250 mL Intravenous Once   ??? diltiazem  240 mg Oral Daily   ??? furosemide  40 mg Intravenous Daily   ??? amiodarone  400 mg Oral TID   ??? celecoxib  100 mg Oral BID   ??? methylPREDNISolone  40 mg Intravenous Q12H   ??? insulin lispro  0-18 Units Subcutaneous TID WC   ??? insulin lispro  0-9 Units Subcutaneous Nightly   ??? anidulafungin  100 mg Intravenous Q24H   ??? metoprolol tartrate  25 mg Oral BID   ??? atorvastatin  10 mg Oral Nightly   ??? calcium elemental  500 mg Oral BID   ??? fentaNYL  1 patch Transdermal Q72H   ??? gabapentin  300 mg Oral TID   ??? pantoprazole  40 mg Oral QAM AC   ??? sennosides-docusate sodium  2 tablet Oral Daily   ??? sertraline  50 mg Oral Daily   ??? valACYclovir  500 mg Oral BID   ??? meropenem  1 g Intravenous Q8H   ??? sodium chloride flush  10 mL Intravenous 2 times per day   ??? Saline Mouthwash  15 mL Swish & Spit 4x Daily AC & HS   ??? tiotropium  18 mcg Inhalation Daily   ??? budesonide  0.25 mg Nebulization BID    And   ??? formoterol  20 mcg Nebulization BID   ??? levalbuterol  1.25 mg Nebulization Q4H WA     Continuous Infusions:  ??? dextrose     ??? sodium chloride 50 mL/hr at 01/27/18 0548     PRN Meds:.acetaminophen, diphenhydrAMINE, potassium phosphate IVPB, morphine **OR** morphine, glucose, dextrose, glucagon (rDNA), dextrose, potassium chloride, magnesium sulfate, LORazepam, albuterol, bisacodyl, ondansetron, oxyCODONE-acetaminophen, prochlorperazine, sodium chloride flush, potassium chloride, magnesium sulfate, magnesium hydroxide, Saline Mouthwash, alteplase, LORazepam    ROS:  As noted above, otherwise remainder of 10-point ROS negative    Physical Exam:     I&O:       Intake/Output Summary (Last 24 hours) at 01/27/2018 1036  Last data filed at 01/27/2018 0827  Gross per 24 hour   Intake 3395 ml   Output 2075 ml   Net 1320 ml       Vital Signs:  BP 122/73    Pulse 68    Temp 98.4 ??F (36.9 ??C) (Oral)    Resp 11    Ht 5' 9"  (1.753 m)    Wt 204 lb 12.9 oz (92.9 kg)    SpO2 100%    BMI 30.24 kg/m??     Weight:    Wt Readings from Last 3 Encounters:   01/27/18 204 lb 12.9 oz (92.9 kg)   01/18/18 194 lb 9.6 oz (88.3 kg)   01/10/18 194 lb 12.8 oz (88.4 kg)         ??  General: Awake, alert and oriented.  HEENT:??normocephalic, alopecia,??PERRL, no scleral erythema or icterus, Oral mucosa moist and intact  LYMPH: ??Left axillary lymphadenopathy??  NECK: supple without palpable adenopathy  BACK: Straight negative CVAT  SKIN:??warm  dry and intact without lesions rashes or masses  CHEST:??rhonchi w/ exp wheeze,??without use of accessory muscles  CV: Normal S1 S2, irregular, no MRG  ABD:??NT ND normoactive BS, no palpable masses or hepatosplenomegaly  EXTREMITIES:??without edema, denies calf tenderness  NEURO: CN II - XII grossly intact  CATHETER:??Right IJ PAC (06/21/17, [facility])??- CDI        Data    CBC:   Recent Labs     01/25/18  0415 01/26/18  0330 01/27/18  0339   WBC 0.1* 0.2* 0.4*   HGB 7.6* 8.0* 7.9*   HCT 22.7* 23.9* 23.3*   MCV 94.0 93.3 92.9   PLT 13* 12* 10*     BMP/Mag:  Recent Labs     01/25/18  0415 01/26/18  0330 01/27/18  0339 01/27/18  0412   NA 135* 135*  --  135*   K 4.1 4.1  --  4.1   CL 102 100  --  100   CO2 24 24  --  26   PHOS 1.6* 1.9* 1.9*  --    BUN 23* 25*  --  26*   CREATININE 0.6* <0.5*  --  0.6*   MG 1.90 2.10 2.00  --      LIVP:   Recent Labs     01/25/18  0415 01/26/18  0330   AST 26 22   ALT 54* 62*   BILIDIR <0.2 <0.2   BILITOT 0.3 0.3   ALKPHOS 86 91     Coags:   Recent Labs     01/25/18  0835 01/27/18  0339   PROTIME 13.5* 12.7   INR 1.18* 1.11   APTT 27.2 26.0     Uric Acid   Recent Labs     01/25/18  0415 01/26/18  0330   LABURIC 2.1* 2.0*        Diagnostics:  1. ??PET scan (01/10/18):    ??    2.  CTA Chest (525/19):  1. ??No pulmonary embolism.  2. ??Patchy groundglass opacities and tree-in-bud are seen throughout the   lungs which are new compared to prior examination. ??This most likely   represents atypical infection/inflammation. ??Clinical correlation is   recommended.  3. ??Paraseptal and centrilobular emphysematous change of the lungs.  4. ??Enlarged left axillary lymph nodes are not significantly changed   compared to PET/CT on 01/10/2018. ??This is worrisome for nodal metastasis.  5. ??Scattered lucent lesions are seen throughout the ribs, spine, and   scapula. ??Findings may represent multiple myeloma or osseous metastasis.    3.  Echocardiogram (01/23/18):  ??Technically difficult examination.  ??Normal left ventricle size, wall thickness, and systolic function with an  ??estimated ejection fraction of 50-55%. No obvious regional wall motion  ??abnormalities are seen (inferior and inferoseptal walls do appear normal, as  ??clinically questioned). Diastolic filling parameters suggests normal  ??diastolic function.  ??Aortic valve appears sclerotic but opens adequately.  ??Possible small/organized pericardial effusion though fat pad cannot be ruled  ??out.    Myeloma Labs (01/03/18):  SPEP:????reveals that M2, previously characterized as monoclonal IgG kappa in  mid-to-slow gamma is 1.0 gm/dL, greater than the 0.2 gm/dL observed on 08 Nov 2017.??  SIFE:????Serum immunofixation electrophoresis reveals persistent M2 in mid-to-slow gamma, a monoclonal IgG kappa. ??A recent M-spike, M1, was minor monoclonal IgG lambda in mid-gamma; M1 continues to be absent.  SFLC:  Kappa:  30.80, previously (11/08/17) -??10.40  Lambda:  1.46, previously (11/08/17) -??5.97  Free Kappa Lambda Ratio:  21.10, previously (11/08/17) -??1.74  Quantitative Immunoglobulins:  IgG:??  1480, previously (11/08/17) - 552  IgA:??  <26, previously (11/08/17) -??30  IgM:??  <20  ??  PROBLEM LIST: ??????????   ????  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 ??COPD   9.????Influenza A??(10/12/17)  10.  COPD Exacerbation (12/2017)  11.  Pericarditis (12/2017)  12.  A. Fib w/ RVR (12/2017)  ????  TREATMENT: ??????????   ??  1. Rad Tx to T5-7, T10-L3, Right Scapula - 3000 cGy - Dr. Jamas Lav 03/20/16-04/01/16  2. RVD x1 03/20/16 - discontinued d/t rash   3. Velcade/Pomalyst/Dex x3 cycles 04/23/16-06/17/16 - discontinued d/t reaction to pomalyst  4. Velcade/Dex x 1 cycle 06/26/16 (last dose of dexamethasone 07/22/16  5. High-dose melphalan followed by administration of PBSCs 2.36 x10^6 cd34cells/kg on 08/28/16  6. Maintenance Revlimid 39m daily (12/2016-04/23/17)  7. Dexamethasone 480mdaily (04/24/17)  8. DCEP   Cycle #1 - 04/26/17  Cycle #2 - 05/25/17 - excellent response  9.??Dara/Velcade/Dex (C1D1 - 06/22/17) - BMBx ??10/18/17 - CR  Cycles 7-8 (21 day cycle)  - Dex 20 mg po D4,5.8,9,11 and 12  - Dara 16 mg/Kg D1 only with Dex 20 mg IV  - Velcade 1.3 mg/M2 D1,4,8 and 11  Cycle 9 and beyond (maintenance, 11/08/17??- 01/03/18)  Dara 16 mg/Kg Q 28 days  Dex 12 mg IV D1  Velcade Q 2 weeks??  10. DCEP   Cycle #1 - 01/13/18  ??  ASSESSMENT AND PLAN:??????????   ??  1. ??IgG kappa multiple myeloma / Plasma Cell Leukemia:??Relapsed disease??  - S/p??treatment??Dara/Velcade/Dex??x 8 cycles??(06/22/17 - 10/2017). Followed by??maintenance Dara??Q4 wks/Velcade??Q2ks/Dex??(started 11/08/17)  - Now w/ progressive disease based on myeloma labs (01/03/18) & PET scan (01/10/18), see above??  ??  PLAN:??DCEP for disease and pain control. Hope for allogeneic transplant in CR2, but needs to disease response and improvement in lung function??  ??  DCEP Cycle #1, Day + 15    Myeloma labs 5/31  ??  2. ??ID: Afebrile w/ possibly multifocal PNA, unknown organism and pericarditis. He is high risk for fungal PNA  d/t long term steroids   - Fungitell and galactomannan (01/25/18) - Pending   - Cont Valtrex ppx  - Cont Meropenem Day + 6  - Eraxis Day + 3    3. Heme:??Chemotherapy induced pancytopenia   - Transfuse for Hgb < 7 and Platelets <??10K  - No transfusion today  - S/p Neulasta (01/19/18)  ??  4.??Metabolic: Stable renal fxn and e-lytes??except for hypoNa &??steroid-induced hyperglycemia and hypoPhos  - Cont high regimen Lispro SSI, AC&HS  - Cont Lasix 40 mg IV daily (started 01/25/18)   - Cont IVF:  NS @ 50 mL/hr  - Keep Mg > 2 and K+ > 4.0 and Phos > 2.0    ??  5. Pulmonary:??Acute respiratory distress with mild hypercapnia and hypoxemia from COPD exacerbation, may also have PNA, unknown organism.  This developed after tapering off of steroids (01/20/18) for previous COPD exacerbation.   - Pulm Nodule: Stable??on??CT chest from??04/23/17??&??10/18/17 &??11/26/17; cont to monitor.   - CTPA??(11/26/17): No PE;??resolution of??mild upper lobe tree-in-bud opacities; Mild emphysema. Two 3 mm LLL pulmonary nodules, stable  - PFT (12/27/17):????compared to??07/2016:??FVC 3.25 L, 66%, and FEV1  1.42 L, 38%. FEV1/FVC is 44%. This is unchanged from prior study. Air trapping seen on lung volumes.  Diffusion capacity 3.91, 79% of predicted, down from 5.21??  - CTPA (01/22/18):  No PE, patchy groundglass opacities and tree-in-bud are seen throughout the  lungs which are new compared to prior examination.   - Pulm following, appreciate recs. Cont regular f/u w/ Dr. Dorann Lodge on d/c  - Cont home inhalers   - Cont Xopenox q4hrs  - Cont Steroid taper: Solumedrol 80 mg TID (started 01/22/18), 40 mg TID (01/25/18), pulm tapering    ??  6. GI/Nutrition: Appetite and oral intake is good??  - Cont PPI ppx w/ steroids??  - Cont low microbial diet ??  Constipation: ??Improved  - Cont Dulcolax prn & Senokot-s 2 tabs daily    ??  7. Cardiac:??Pericarditis, A fib with RVR  - H/o HLD &??has septal wall defect on echocardiogram.  - Echo (01/23/18):  LVEF 50-55# w/ normal diastolic fxn & no  obvious regional wall motion abnormalities  - Cardiology following, appreciate recs  - Cont Lipitor??  - Cont Amiodarone & Lopressor 25 mg bid  - Cont Dilt gtt  - Cont Solumedrol COPD exacerbation & pericarditis; as tapered and if counts improve, consider colchicine d/t high relapse of pericarditis when steroids tapered  - Start Celebrex (started 01/26/18)  ??  8. Psych:  Ongoing concerns about disease progression and ability to get transplant   Anxiety/Depression: Ongoing  - Cont Zoloft??50 mg daily??  - Psych to follow   Insomnia:??No complaints??currently??  ??  9. Peripheral Neuropathy:??Ongoing  - Cont Gabapentin 300 mg??TID  ??  10. 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  11.??Acute on??Chronic left lower back pain d/t Neoplasm:????Improved w/ chemotherapy, but now w/ lateral rib pain/pleuritic pain  - S/p Radiation (09/01/17 - 09/15/17,??Fried)  - Cont Fentanyl patch??50 mcg/hr (increased 01/13/18, Rx 01/18/18)  - Cont Percocet 7.5/325 mg q4hrs prn      - DVT Prophylaxis: Platelets <50,000 cells/dL - prophylactic lovenox on hold and mechanical prophylaxis with bilateral SCDs while in bed in place.  Contraindications to pharmacologic prophylaxis: Thrombocytopenia  Contraindications to mechanical prophylaxis: None    - Disposition: Once breathing and chest pain improves       Harlene Salts, MD      Harlene Salts, MD  [facility]  Please contact me through [place]

## 2018-01-27 NOTE — Oncology Nurse Navigation (Signed)
Called pharmacy, ok to run Potassium Phosphate 20 mmol in D5 via primary line,  peripheral access.

## 2018-01-27 NOTE — Progress Notes (Signed)
In NSR. Still sob.    PE:  Blood pressure 122/73, pulse 68, temperature 98.4 ??F (36.9 ??C), temperature source Oral, resp. rate 11, height 5' 9"  (1.753 m), weight 204 lb 12.9 oz (92.9 kg), SpO2 100 %.  General (appearance):  Alert and cooperative.   Eyes: Anicteric  Neck: Supple. No JVD  Ears/Nose/Mouth/Thorat: No cyanosis  CV:RRR No obvious m/r/g  Respiratory:  +wheezing  GI: Abd s/nt/nd. No peritoneal signs  Skin: Warm, dry. No rashes  Neuro/Psych: Alert and oriented x 3. Appropriate behavior  Ext:  No c/c. No pitting edema  Pulses:  2+ radial    Lab Results   Component Value Date    WBC 0.4 (LL) 01/27/2018    HGB 7.9 (L) 01/27/2018    HCT 23.3 (L) 01/27/2018    MCV 92.9 01/27/2018    PLT 10 (LL) 01/27/2018     Lab Results   Component Value Date    NA 135 (L) 01/27/2018    K 4.1 01/27/2018    CL 100 01/27/2018    CO2 26 01/27/2018    BUN 26 (H) 01/27/2018    CREATININE 0.6 (L) 01/27/2018    GLUCOSE 210 (H) 01/27/2018    CALCIUM 7.9 (L) 01/27/2018    PROT 6.0 (L) 01/26/2018    LABALBU 2.8 (L) 01/26/2018    BILITOT 0.3 01/26/2018    ALKPHOS 91 01/26/2018    AST 22 01/26/2018    ALT 62 (H) 01/26/2018    LABGLOM >60 01/27/2018    GFRAA >60 01/27/2018    AGRATIO 1.0 (L) 01/22/2018    GLOB 3.1 01/22/2018     Lab Results   Component Value Date    INR 1.11 01/27/2018    INR 1.18 (H) 01/25/2018    INR 1.36 (H) 01/22/2018    PROTIME 12.7 01/27/2018    PROTIME 13.5 (H) 01/25/2018    PROTIME 15.5 (H) 01/22/2018     Lab Results   Component Value Date    TROPONINI <0.01 01/22/2018     Lab Results   Component Value Date    ALT 62 (H) 01/26/2018    AST 22 01/26/2018    ALKPHOS 91 01/26/2018    BILITOT 0.3 01/26/2018     Lab Results   Component Value Date    ALT 62 (H) 01/26/2018    AST 22 01/26/2018    ALKPHOS 91 01/26/2018    BILITOT 0.3 01/26/2018       01/21/2018 TTE: EF 50-55%. No def RWMA (inferior and inferoseptum was normal). No sig valve regurgitation or stenosis.    01/22/18 ECGs reviewed. Earlier ecg seemed to demonstrate  acute inferior MI but Tns negative and further ecgs showed evolution other ST segments suggesting acute pericarditis.    01/21/2018 CTA Chest: No PE. Tree-in-bud bilateral opacities suggesting atypical infection/inflammation. Enlarged L axillary notes unchanged. Ribs, spine, and scapula lesions suggesting MM/bony mets. No sig pericardial effusion noted.    01/22/18 CXR: Hyperinflated lungs. No definite acute consolidation    A/P:  62 y.o. admitted with severe SOB.  -Acute pericarditis  -Multiple myeloma  -COPD  -Neutropenia  -Pneumonia with "tree in bud" appearance of CT chest  -Hyperlipidemia  -parox A fib with RVR    Recs:  -Change amio to 400 oral tid x 4 days. Sunday start 200 mg daily  -Change dilt to 240 mg oral daily. Go to 360 if tolerates later  -No a/c given myeloma/pancytopenia    Yizel Canby A. Bunnie Philips, MD, [facility], [facility]

## 2018-01-27 NOTE — Progress Notes (Signed)
CC: COPD exacerbation    Chest discomfort improved today.  Continues to cough productively.  Dyspnea better this morning, but had some real trouble yesterday ambulating to the bathroom.  He is very frightened, and particularly worried that he will not "pass the test" that would enable him to qualify for stem cell transplant.  "I'm not ready to go yet"  Afebrile.  WBC 0.4  Continuing empiric antibiotic/antifungal therapy for neutropenia  BP 122/73.  Converted to sinus rhythm  S1, S2 distant  O2 saturation 95%, O2 @ 2 L/m  Breath sounds moderately reduced.  Rare very faint expiratory wheeze.  Pleural friction rub both lung bases  Abdomen obese, soft, no tenderness.  No peripheral edema.  Fluid balance positive    A&P: COPD exacerbation continues to improve.  Slowly tapering steroids.  Pleuritis and pericarditis, unclear origin, may be viral.  Certainly a source of pain which is a little better today on Cox 2 agent  Atrial fibrillation, converted to sinus rhythm.  Continuing Cardizem and amiodarone  Neutropenia, chemotherapy induced.  Count improving  Multiple myeloma, progressive disease

## 2018-01-27 NOTE — Plan of Care (Signed)
Problem: Falls - Risk of:  Goal: Will remain free from falls  Description  Will remain free from falls  01/27/2018 0702 by Kathlynn Grate, RN  Outcome: Ongoing  Orthostatic vital signs obtained at start of shift - see flowsheet for details.  Pt meets criteria for orthostasis.  Pt is a High fall risk. See Leamon Arnt Fall Score and ABCDS Injury Risk assessments.   + Screening for Orthostasis and/or + High Fall Risk per MORSE/ABCDS: Explained fall risk precautions to pt and family and rationale behind their use to keep the patient safe. Pt bed is in low position, side rails up, call light and belongings are in reach. Fall wristband applied and present on pts wrist.  Bed alarm on.  Pt encouraged to call for assistance. Will continue with hourly rounds for PO intake, pain needs, toileting and repositioning as needed.     Problem: Pain:  Goal: Pain level will decrease  Description  Pain level will decrease  01/27/2018 0702 by Kathlynn Grate, RN  Outcome: Ongoing   Pt requested pain meds x5 during shift; stated that alternating Percocet and Morphine has helped in past.  Pt responded well to pain meds after 3rd administration.  RR down to 7 at times, but easily arousable.    Problem: Nutrition  Goal: Optimal nutrition therapy  01/27/2018 0702 by Kathlynn Grate, RN  Outcome: Ongoing   Pt's oral intake adequate, snacked throughout night shift and tolerated well.    Problem: Bleeding:  Goal: Will show no signs and symptoms of excessive bleeding  Description  Will show no signs and symptoms of excessive bleeding  01/27/2018 0702 by Kathlynn Grate, RN  Outcome: Ongoing   Patient's hemoglobin this AM:   Recent Labs     01/27/18  0339   HGB 7.9*     Patient's platelet count this AM:   Recent Labs     01/27/18  0339   PLT 10*    Thrombocytopenia Precautions in place.  Patient showing no signs or symptoms of active bleeding.  Patient transfused blood products per orders - see flowsheet.  Patient verbalizes understanding of all instructions.  Will continue to assess and implement POC. Call light within reach and hourly rounding in place.      Problem: PROTECTIVE PRECAUTIONS  Goal: Patient will remain free of nosocomial Infections  Outcome: Ongoing   WBC today is 0.4.  Pt afebrile.  No signs/sx of infection this visit.  CVC site remains free of signs/symptoms of infection. No drainage, edema, erythema, pain, or warmth noted at site. Line care provided per flowsheets. Medications reviewed, pt taking all prophylactic antimicrobials as prescribed.  Reviewed neutropenic precautions.

## 2018-01-27 NOTE — Plan of Care (Signed)
Problem: Falls - Risk of:  Goal: Will remain free from falls  01/27/2018 1822 by Lilla Shook, RN  Outcome: Ongoing  Bed alarm in use.  Call light in reach.  Patient reminded not to get up  without help.      Problem: Cardiac:  Goal: Ability to maintain vital signs within normal range will improve  Outcome: Ongoing  Patient is back in nora ml sinus rhythm and has been transitioned over to po Cardizem and po amiodarone.    Problem: Breathing Pattern - Ineffective:  Goal: Ability to achieve and maintain a regular respiratory rate will improve  Outcome: Ongoing  Patient is now on room air and has been able to cough up some secretions.      Problem: Pain:  Goal: Control of acute pain  Outcome: Ongoing  Patient states pain is better than yesterday.  Patient medicated with Morphine and Oxycodone

## 2018-01-28 LAB — IGA: IgA: 17 mg/dL — ABNORMAL LOW (ref 70.0–400.0)

## 2018-01-28 LAB — CBC WITH AUTO DIFFERENTIAL
Basophils %: 0.3 %
Basophils Absolute: 0 10*3/uL (ref 0.0–0.2)
Eosinophils %: 0.5 %
Eosinophils Absolute: 0 10*3/uL (ref 0.0–0.6)
Hematocrit: 24 % — ABNORMAL LOW (ref 40.5–52.5)
Hemoglobin: 8.1 g/dL — ABNORMAL LOW (ref 13.5–17.5)
Lymphocytes %: 4.7 %
Lymphocytes Absolute: 0 10*3/uL — ABNORMAL LOW (ref 1.0–5.1)
MCH: 31.4 pg (ref 26.0–34.0)
MCHC: 33.5 g/dL (ref 31.0–36.0)
MCV: 93.6 fL (ref 80.0–100.0)
MPV: 7.5 fL (ref 5.0–10.5)
Monocytes %: 5.9 %
Monocytes Absolute: 0 10*3/uL (ref 0.0–1.3)
Neutrophils %: 88.6 %
Neutrophils Absolute: 0.5 10*3/uL — CL (ref 1.7–7.7)
Platelets: 25 10*3/uL — ABNORMAL LOW (ref 135–450)
RBC: 2.57 M/uL — ABNORMAL LOW (ref 4.20–5.90)
RDW: 18.3 % — ABNORMAL HIGH (ref 12.4–15.4)
WBC: 0.6 10*3/uL — ABNORMAL LOW (ref 4.0–11.0)

## 2018-01-28 LAB — BASIC METABOLIC PANEL
Anion Gap: 9 (ref 3–16)
BUN: 26 mg/dL — ABNORMAL HIGH (ref 7–20)
CO2: 26 mmol/L (ref 21–32)
Calcium: 8.1 mg/dL — ABNORMAL LOW (ref 8.3–10.6)
Chloride: 102 mmol/L (ref 99–110)
Creatinine: 0.6 mg/dL — ABNORMAL LOW (ref 0.9–1.3)
GFR African American: >60 (ref >60)
GFR Non-African American: >60 (ref >60)
Glucose: 237 mg/dL — ABNORMAL HIGH (ref 70–99)
Potassium: 4.5 mmol/L (ref 3.5–5.1)
Sodium: 137 mmol/L (ref 136–145)

## 2018-01-28 LAB — POCT GLUCOSE
POC Glucose: 219 mg/dl — ABNORMAL HIGH (ref 70–99)
POC Glucose: 250 mg/dl — ABNORMAL HIGH (ref 70–99)
POC Glucose: 220 mg/dl — ABNORMAL HIGH (ref 70–99)

## 2018-01-28 LAB — 1,3 BETA-D-GLUCAN
(1,3)-Beta-D-Glucan (Fungitell) Interpretation: POSITIVE — AB
(1,3)-Beta-D-Glucan (fungitell): 403 pg/mL

## 2018-01-28 LAB — HEPATIC FUNCTION PANEL
ALT: 63 U/L — ABNORMAL HIGH (ref 10–40)
AST: 20 U/L (ref 15–37)
Albumin: 2.8 g/dL — ABNORMAL LOW (ref 3.4–5.0)
Alkaline Phosphatase: 101 U/L (ref 40–129)
Bilirubin, Direct: <0.2 mg/dL (ref 0.0–0.3)
Total Bilirubin: 0.3 mg/dL (ref 0.0–1.0)
Total Protein: 5.6 g/dL — ABNORMAL LOW (ref 6.4–8.2)

## 2018-01-28 LAB — URIC ACID: Uric Acid, Serum: 2.1 mg/dL — ABNORMAL LOW (ref 3.5–7.2)

## 2018-01-28 LAB — PHOSPHORUS: Phosphorus: 1.6 mg/dL — ABNORMAL LOW (ref 2.5–4.9)

## 2018-01-28 LAB — IGG: IgG: 791 mg/dL (ref 700.0–1600.0)

## 2018-01-28 LAB — ASPERGILLUS GALACTOMANNAN AG ASSAY
Aspergillus Galacto AG: POSITIVE — AB
Aspergillus Galacto Index: 0.59

## 2018-01-28 LAB — MAGNESIUM: Magnesium: 2 mg/dL (ref 1.80–2.40)

## 2018-01-28 LAB — LACTATE DEHYDROGENASE: LD: 147 U/L (ref 100–190)

## 2018-01-28 LAB — IGM: IgM: 11 mg/dL — ABNORMAL LOW (ref 40.0–230.0)

## 2018-01-28 MED FILL — FUROSEMIDE 10 MG/ML IJ SOLN: 10 mg/mL | INTRAMUSCULAR | Qty: 4

## 2018-01-28 MED FILL — BUDESONIDE 0.25 MG/2ML IN SUSP: 0.25 MG/2ML | RESPIRATORY_TRACT | Qty: 2

## 2018-01-28 MED FILL — VALACYCLOVIR HCL 500 MG PO TABS: 500 mg | ORAL | Qty: 1

## 2018-01-28 MED FILL — MEROPENEM 1 G IV SOLR: 1 g | INTRAVENOUS | Qty: 1

## 2018-01-28 MED FILL — SENNOSIDES-DOCUSATE SODIUM 8.6-50 MG PO TABS: 8.6-50 mg | ORAL | Qty: 2

## 2018-01-28 MED FILL — SODIUM CHLORIDE 0.9 % IV SOLN: 0.9 % | INTRAVENOUS | Qty: 1000

## 2018-01-28 MED FILL — MORPHINE SULFATE 4 MG/ML IJ SOLN: 4 mg/mL | INTRAMUSCULAR | Qty: 1

## 2018-01-28 MED FILL — OYSTER SHELL CALCIUM 500 MG PO TABS: 500 mg | ORAL | Qty: 1

## 2018-01-28 MED FILL — AMIODARONE HCL 200 MG PO TABS: 200 mg | ORAL | Qty: 2

## 2018-01-28 MED FILL — LEVALBUTEROL HCL 1.25 MG/0.5ML IN NEBU: 1.25 MG/0.5ML | RESPIRATORY_TRACT | Qty: 1

## 2018-01-28 MED FILL — PERFOROMIST 20 MCG/2ML IN NEBU: 20 MCG/2ML | RESPIRATORY_TRACT | Qty: 2

## 2018-01-28 MED FILL — GABAPENTIN 300 MG PO CAPS: 300 mg | ORAL | Qty: 1

## 2018-01-28 MED FILL — CELEBREX 100 MG PO CAPS: 100 mg | ORAL | Qty: 1

## 2018-01-28 MED FILL — METHYLPREDNISOLONE SODIUM SUCC 40 MG IJ SOLR: 40 mg | INTRAMUSCULAR | Qty: 40

## 2018-01-28 MED FILL — METOPROLOL TARTRATE 25 MG PO TABS: 25 mg | ORAL | Qty: 1

## 2018-01-28 MED FILL — LORAZEPAM 1 MG PO TABS: 1 mg | ORAL | Qty: 1

## 2018-01-28 MED FILL — PANTOPRAZOLE SODIUM 40 MG PO TBEC: 40 mg | ORAL | Qty: 1

## 2018-01-28 MED FILL — SERTRALINE HCL 50 MG PO TABS: 50 mg | ORAL | Qty: 1

## 2018-01-28 MED FILL — LIPITOR 20 MG PO TABS: 20 mg | ORAL | Qty: 1

## 2018-01-28 MED FILL — DILTIAZEM HCL ER COATED BEADS 240 MG PO CP24: 240 mg | ORAL | Qty: 1

## 2018-01-28 MED FILL — POTASSIUM PHOSPHATES 45 MMOLE/15ML IV SOLN: 45 MMOLE/15ML | INTRAVENOUS | Qty: 10

## 2018-01-28 MED FILL — ERAXIS 100 MG IV SOLR: 100 mg | INTRAVENOUS | Qty: 30

## 2018-01-28 NOTE — Progress Notes (Addendum)
S: Continues to have SOB and cough     Tele:    O:  Physical Exam:  BP 124/75    Pulse 71    Temp 97.8 ??F (36.6 ??C) (Oral)    Resp 13    Ht 5' 9"  (1.753 m)    Wt 204 lb 12.9 oz (92.9 kg)    SpO2 96%    BMI 30.24 kg/m??    General (appearance):  No acute distress  Eyes: anicteric   Neck: soft, No JVD  Ears/Nose/Mouth/Thorat: No cyanosis  CV: RRR   Respiratory:  + wheeze  GI: soft, non-tender, non-distended  Skin: Warm, dry. No rashes  Neuro/Psych: Alert and oriented x 3. Appropriate behavior  Ext:  No c/c. No pitting edema  Pulses:  2+ radial     I.O's= +6 liters    Weight  Admission: Weight: 194 lb 10.7 oz (88.3 kg)   Today: Weight: 204 lb 12.9 oz (92.9 kg)    CBC:   Recent Labs     01/26/18  0330 01/27/18  0339 01/28/18  0330   WBC 0.2* 0.4* 0.6*   HGB 8.0* 7.9* 8.1*   HCT 23.9* 23.3* 24.0*   MCV 93.3 92.9 93.6   PLT 12* 10* 25*     BMP:   Recent Labs     01/26/18  0330 01/27/18  0339 01/27/18  0412 01/28/18  0330   NA 135*  --  135* 137   K 4.1  --  4.1 4.5   CL 100  --  100 102   CO2 24  --  26 26   PHOS 1.9* 1.9*  --  1.6*   BUN 25*  --  26* 26*   CREATININE <0.5*  --  0.6* 0.6*     Mag:   Lab Results   Component Value Date    MG 2.00 01/28/2018     LIVER PROFILE:   Recent Labs     01/26/18  0330 01/28/18  0330   AST 22 20   ALT 62* 63*   BILIDIR <0.2 <0.2   BILITOT 0.3 0.3   ALKPHOS 91 101     PT/INR:   Recent Labs     01/27/18  0339   PROTIME 12.7   INR 1.11     APTT:   Recent Labs     01/27/18  0339   APTT 26.0     Pro-BNP:   Lab Results   Component Value Date    PROBNP 324 01/21/2018       Imaging:    01/21/2018 TTE: EF 50-55%. No def RWMA (inferior and inferoseptum was normal). No sig valve regurgitation or stenosis.  ??  01/22/18 ECGs reviewed. Earlier ecg seemed to demonstrate acute inferior MI but Tns negative and further ecgs showed evolution other ST segments suggesting acute pericarditis.  ??  01/21/2018 CTA Chest: No PE. Tree-in-bud bilateral opacities suggesting atypical infection/inflammation. Enlarged L  axillary notes unchanged. Ribs, spine, and scapula lesions suggesting MM/bony mets. No sig pericardial effusion noted.  ??  01/22/18 CXR: Hyperinflated lungs. No definite acute consolidation    Assessment:    62 y.o. admitted with severe SOB.  -Acute pericarditis  -Multiple myeloma  -COPD  -Neutropenia  -Pneumonia with "tree in bud" appearance of CT chest  -Hyperlipidemia  -parox A fib with RVR      Plan:  -Keep K>4, Mg>2.  -Amio TID x 4 days then Sunday start amio 200 mg po daily  -Diltiazem, metoprolol   -  No A/C d/t pancytopenia   -Lipitor  -lasix

## 2018-01-28 NOTE — Plan of Care (Signed)
Problem: Falls - Risk of:  Goal: Will remain free from falls  Description  Will remain free from falls  01/27/2018 0702 by Clearnce Sorrel, RN  Outcome: Ongoing  Orthostatic vital signs obtained at start of shift - see flowsheet for details.  Pt meets criteria for orthostasis.  Pt is a High fall risk. See Lattie Corns Fall Score and ABCDS Injury Risk assessments.   + Screening for Orthostasis and/or + High Fall Risk per MORSE/ABCDS: Explained fall risk precautions to pt and family and rationale behind their use to keep the patient safe. Pt bed is in low position, side rails up, call light and belongings are in reach. Fall wristband applied and present on pts wrist.  Bed alarm on.  Pt encouraged to call for assistance. Will continue with hourly rounds for PO intake, pain needs, toileting and repositioning as needed.     Problem: Pain:  Goal: Pain level will decrease  Description  Pain level will decrease  01/27/2018 0702 by Clearnce Sorrel, RN  Outcome: Ongoing   Pt requested pain meds x3 during shift; pt responded well.    Problem: Nutrition  Goal: Optimal nutrition therapy  01/27/2018 0702 by Clearnce Sorrel, RN  Outcome: Ongoing   Pt's oral intake adequate.    Problem: Bleeding:  Goal: Will show no signs and symptoms of excessive bleeding  Description  Will show no signs and symptoms of excessive bleeding  01/27/2018 0702 by Clearnce Sorrel, RN  Outcome: Ongoing     Patient's hemoglobin this AM:   Recent Labs     01/28/18  0330   HGB 8.1*     Patient's platelet count this AM:   Recent Labs     01/28/18  0330   PLT 25*    Thrombocytopenia Precautions in place.  Patient showing no signs or symptoms of active bleeding.  Transfusion not indicated at this time.  Patient verbalizes understanding of all instructions. Will continue to assess and implement POC. Call light within reach and hourly rounding in place.   -Pt required K Phos replacement.    Problem: PROTECTIVE PRECAUTIONS  Goal: Patient will remain free of nosocomial  Infections  Outcome: Ongoing   ANC today is 0.5.  Pt afebrile.  No signs/sx of infection this visit.  CVC site remains free of signs/symptoms of infection. No drainage, edema, erythema, pain, or warmth noted at site. Line care provided per flowsheets. Medications reviewed, pt taking all prophylactic antimicrobials as prescribed.  Reviewed neutropenic precautions.      GAS EXCHANGE:   Pt remained on room air throughout shift.

## 2018-01-28 NOTE — Progress Notes (Addendum)
Nutrition Assessment    Type and Reason for Visit: Reassess    Nutrition Recommendations:   1. Continue current General diet, low microbial  2. Monitor weight, labs and clinical progress  3. Monitor, record and encourage adequate PO intake at all meals through admission especially protein  4. ONS: Start Magic Cup bid per pt preference      Nutrition Assessment: Follow-up for po adequacy. Pt continues to be nutritionally compromised AEB fair appetite for the past few days, however seems to be improving today. RD encouraged adequate protein intake including snacks. Pt states he has been consuming granola bars, yogurt and candy. Will start ONS per pt preference (likes ice cream), continue to monitor intake adequacy and nutrition status.     Malnutrition Assessment:  ?? Malnutrition Status: At risk for malnutrition  ?? Context: Chronic illness  ?? Findings of the 6 clinical characteristics of malnutrition (Minimum of 2 out of 6 clinical characteristics is required to make the diagnosis of moderate or severe Protein Calorie Malnutrition based on AND/ASPEN Guidelines):  1. Energy Intake-Less than or equal to 75% of estimated energy requirement, Greater than or equal to 5 days    2. Weight Loss-No significant weight loss,    3. Fat Loss-No significant subcutaneous fat loss,    4. Muscle Loss-No significant muscle mass loss,    5. Fluid Accumulation-No significant fluid accumulation,    6. Grip Strength-Not measured    Nutrition Risk Level: Moderate    Nutrient Needs:  ?? Estimated Daily Total Kcal: 8756-4332 (22-25)  ?? Estimated Daily Protein (g): 88-115 (1-1.2)  ?? Estimated Daily Total Fluid (ml/day): 9518-8416 (1 ml/kcal)    Nutrition Diagnosis:   ?? Problem: Inadequate oral intake  ?? Etiology: related to Insufficient energy/nutrient consumption    ??? Signs and symptoms:  as evidenced by Intake 25-50%    Objective Information:  ?? Nutrition-Focused Physical Findings: appears obese; last BM x 1 on 5/31; non-pitting RUE and  LUE edema and trace non-pitting RLE and LLE edema; +5.8 L since admit on diuretic therapy  ?? Wound Type: None  ?? Current Nutrition Therapies:  ?? Oral Diet Orders: General, Low Microbial   ?? Oral Diet intake: 26-50%, 51-75%, 76-100%  ?? Oral Nutrition Supplement (ONS) Orders: None  ?? Anthropometric Measures:  ?? Ht: 5\' 9"  (175.3 cm)   ?? Current Body Wt: 204 lb (92.5 kg)  ?? Admission Body Wt: 194 lb (88 kg)  ?? Usual Body Wt: (194# "about usual" per patient)  ?? % Weight Change:  ,  Weight appears stable per epic  ?? Ideal Body Wt: 160 lb 4.4 oz (72.7 kg),   ?? BMI Classification: BMI 30.0 - 34.9 Obese Class I    Nutrition Interventions:   Continue current diet, Start ONS  Continued Inpatient Monitoring    Nutrition Evaluation:   ?? Evaluation: Progressing toward goals   ?? Goals: Patient to tolerate and consume >75% of meals     ?? Monitoring: Meal Intake, Supplement Intake, Diet Tolerance, I&O, Weight, Pertinent Labs      Electronically signed by Dorcas Mcmurray, RD, LD on 01/28/18 at 1:41 PM    Contact Number: [phone]

## 2018-01-28 NOTE — Progress Notes (Signed)
[facility] Progress Note    01/28/2018     David Watkins    MRN: [id number]    DOB: 1956-08-19    SUBJECTIVE:  He feels well. His previously noted sob cont to improve.     ECOG PS:  (2) Ambulatory and capable of self care, unable to carry out work activity, up and about > 50% or waking hours    Isolation: None    Medications    Scheduled Meds:  ??? diltiazem  240 mg Oral Daily   ??? furosemide  40 mg Intravenous Daily   ??? amiodarone  400 mg Oral TID   ??? celecoxib  100 mg Oral BID   ??? methylPREDNISolone  40 mg Intravenous Q12H   ??? insulin lispro  0-18 Units Subcutaneous TID WC   ??? insulin lispro  0-9 Units Subcutaneous Nightly   ??? anidulafungin  100 mg Intravenous Q24H   ??? metoprolol tartrate  25 mg Oral BID   ??? atorvastatin  10 mg Oral Nightly   ??? calcium elemental  500 mg Oral BID   ??? fentaNYL  1 patch Transdermal Q72H   ??? gabapentin  300 mg Oral TID   ??? pantoprazole  40 mg Oral QAM AC   ??? sennosides-docusate sodium  2 tablet Oral Daily   ??? sertraline  50 mg Oral Daily   ??? valACYclovir  500 mg Oral BID   ??? meropenem  1 g Intravenous Q8H   ??? sodium chloride flush  10 mL Intravenous 2 times per day   ??? Saline Mouthwash  15 mL Swish & Spit 4x Daily AC & HS   ??? tiotropium  18 mcg Inhalation Daily   ??? budesonide  0.25 mg Nebulization BID    And   ??? formoterol  20 mcg Nebulization BID   ??? levalbuterol  1.25 mg Nebulization Q4H WA     Continuous Infusions:  ??? dextrose     ??? sodium chloride 50 mL/hr at 01/28/18 0315     PRN Meds:.acetaminophen, diphenhydrAMINE, potassium phosphate IVPB, morphine **OR** morphine, glucose, dextrose, glucagon (rDNA), dextrose, potassium chloride, magnesium sulfate, LORazepam, albuterol, bisacodyl, ondansetron, oxyCODONE-acetaminophen, prochlorperazine, sodium chloride flush, potassium chloride, magnesium sulfate, magnesium hydroxide, Saline Mouthwash, alteplase, LORazepam    ROS:  As noted above, otherwise remainder of 10-point ROS negative    Physical Exam:     I&O:      Intake/Output Summary  (Last 24 hours) at 01/28/2018 0840  Last data filed at 01/28/2018 9147  Gross per 24 hour   Intake 1942 ml   Output 1975 ml   Net -33 ml       Vital Signs:  BP 113/84    Pulse 76    Temp 97.7 ??F (36.5 ??C) (Oral)    Resp 15    Ht 5\' 9"  (1.753 m)    Wt 204 lb 12.9 oz (92.9 kg)    SpO2 95%    BMI 30.24 kg/m??     Weight:    Wt Readings from Last 3 Encounters:   01/27/18 204 lb 12.9 oz (92.9 kg)   01/18/18 194 lb 9.6 oz (88.3 kg)   01/10/18 194 lb 12.8 oz (88.4 kg)         ??  General: Awake, alert and oriented.  HEENT:??normocephalic, alopecia,??PERRL, no scleral erythema or icterus, Oral mucosa moist and intact  LYMPH: ??Left axillary lymphadenopathy??  NECK: supple without palpable adenopathy  BACK: Straight negative CVAT  SKIN:??warm dry and intact without lesions rashes or  masses  CHEST:??rhonchi w/ exp wheeze,??without use of accessory muscles  CV: Normal S1 S2, irregular, no MRG  ABD:??NT ND normoactive BS, no palpable masses or hepatosplenomegaly  EXTREMITIES:??without edema, denies calf tenderness  NEURO: CN II - XII grossly intact  CATHETER:??Right IJ PAC (06/21/17, [facility])??- CDI        Data    CBC:   Recent Labs     01/26/18  0330 01/27/18  0339 01/28/18  0330   WBC 0.2* 0.4* 0.6*   HGB 8.0* 7.9* 8.1*   HCT 23.9* 23.3* 24.0*   MCV 93.3 92.9 93.6   PLT 12* 10* 25*     BMP/Mag:  Recent Labs     01/26/18  0330 01/27/18  0339 01/27/18  0412 01/28/18  0330   NA 135*  --  135* 137   K 4.1  --  4.1 4.5   CL 100  --  100 102   CO2 24  --  26 26   PHOS 1.9* 1.9*  --  1.6*   BUN 25*  --  26* 26*   CREATININE <0.5*  --  0.6* 0.6*   MG 2.10 2.00  --  2.00     LIVP:   Recent Labs     01/26/18  0330 01/28/18  0330   AST 22 20   ALT 62* 63*   BILIDIR <0.2 <0.2   BILITOT 0.3 0.3   ALKPHOS 91 101     Coags:   Recent Labs     01/27/18  0339   PROTIME 12.7   INR 1.11   APTT 26.0     Uric Acid   Recent Labs     01/26/18  0330 01/28/18  0330   LABURIC 2.0* 2.1*       Diagnostics:  1. ??PET scan (01/10/18):    ??    2.  CTA Chest (525/19):  1.  ??No pulmonary embolism.  2. ??Patchy groundglass opacities and tree-in-bud are seen throughout the   lungs which are new compared to prior examination. ??This most likely   represents atypical infection/inflammation. ??Clinical correlation is   recommended.  3. ??Paraseptal and centrilobular emphysematous change of the lungs.  4. ??Enlarged left axillary lymph nodes are not significantly changed   compared to PET/CT on 01/10/2018. ??This is worrisome for nodal metastasis.  5. ??Scattered lucent lesions are seen throughout the ribs, spine, and   scapula. ??Findings may represent multiple myeloma or osseous metastasis.    3.  Echocardiogram (01/23/18):  ??Technically difficult examination.  ??Normal left ventricle size, wall thickness, and systolic function with an  ??estimated ejection fraction of 50-55%. No obvious regional wall motion  ??abnormalities are seen (inferior and inferoseptal walls do appear normal, as  ??clinically questioned). Diastolic filling parameters suggests normal  ??diastolic function.  ??Aortic valve appears sclerotic but opens adequately.  ??Possible small/organized pericardial effusion though fat pad cannot be ruled  ??out.    Myeloma Labs (01/03/18):  SPEP:????reveals that M2, previously characterized as monoclonal IgG kappa in  mid-to-slow gamma is 1.0 gm/dL, greater than the 0.2 gm/dL observed on 09 Nov 979.??  SIFE:????Serum immunofixation electrophoresis reveals persistent M2 in mid-to-slow gamma, a monoclonal IgG kappa. ??A recent M-spike, M1, was minor monoclonal IgG lambda in mid-gamma; M1 continues to be absent.  SFLC:  Kappa:  30.80, previously (11/08/17) -??10.40  Lambda:  1.46, previously (11/08/17) -??5.97  Free Kappa Lambda Ratio:  21.10, previously (11/08/17) -??1.74  Quantitative Immunoglobulins:  IgG:??  1480, previously (11/08/17) -  552  IgA:??  <26, previously (11/08/17) -??30  IgM:??  <20  ??  PROBLEM LIST: ??????????   ????  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 ??COPD   9.????Influenza A??(10/12/17)  10.  COPD Exacerbation (12/2017)  11.  Pericarditis (12/2017)  12.  A. Fib w/ RVR (12/2017)  ????  TREATMENT: ??????????   ??  1. Rad Tx to T5-7, T10-L3, Right Scapula - 3000 cGy - Dr. Bonita Quin 03/20/16-04/01/16  2. RVD x1 03/20/16 - discontinued d/t rash   3. Velcade/Pomalyst/Dex x3 cycles 04/23/16-06/17/16 - discontinued d/t reaction to pomalyst  4. Velcade/Dex x 1 cycle 06/26/16 (last dose of dexamethasone 07/22/16  5. High-dose melphalan followed by administration of PBSCs 2.36 x10^6 cd34cells/kg on 08/28/16  6. Maintenance Revlimid 10mg  daily (12/2016-04/23/17)  7. Dexamethasone 40mg  daily (04/24/17)  8. DCEP   Cycle #1 - 04/26/17  Cycle #2 - 05/25/17 - excellent response  9.??Dara/Velcade/Dex (C1D1 - 06/22/17) - BMBx ??10/18/17 - CR  Cycles 7-8 (21 day cycle)  - Dex 20 mg po D4,5.8,9,11 and 12  - Dara 16 mg/Kg D1 only with Dex 20 mg IV  - Velcade 1.3 mg/M2 D1,4,8 and 11  Cycle 9 and beyond (maintenance, 11/08/17??- 01/03/18)  Dara 16 mg/Kg Q 28 days  Dex 12 mg IV D1  Velcade Q 2 weeks??  10. DCEP   Cycle #1 - 01/13/18  ??  ASSESSMENT AND PLAN:??????????   ??  1. ??IgG kappa multiple myeloma / Plasma Cell Leukemia:??Relapsed disease??  - S/p??treatment??Dara/Velcade/Dex??x 8 cycles??(06/22/17 - 10/2017). Followed by??maintenance Dara??Q4 wks/Velcade??Q2ks/Dex??(started 11/08/17)  - Now w/ progressive disease based on myeloma labs (01/03/18) & PET scan (01/10/18), see above??  ??  PLAN:??DCEP for disease and pain control. Hope for allogeneic transplant in CR2, but needs to disease response and improvement in lung function??  ??  DCEP Cycle #1, Day + 16    Myeloma labs 5/31  ??  2. ??ID: Afebrile w/ possibly multifocal PNA, unknown organism and pericarditis. He is high risk for fungal PNA d/t long term steroids   - Fungitell and galactomannan (01/25/18) - Pending    - Cont Valtrex ppx  - Cont Meropenem Day + 7  - Eraxis Day + 4    3. Heme:??Chemotherapy induced pancytopenia   - Transfuse for Hgb < 7 and Platelets <??10K  - No transfusion today  - S/p Neulasta (01/19/18)  ??  4.??Metabolic: hypervolemia + fair uop + steroid-induced hyperglycemia and hypoPhos  - Cont high regimen Lispro SSI, AC&HS  - Cont Lasix 40 mg IV daily (started 01/25/18)   - Cont IVF:  NS @ 50 mL/hr  - Keep Mg > 2 and K+ > 4.0 and Phos > 2.0    5. Pulmonary:??Acute respiratory distress with mild hypercapnia and hypoxemia from COPD exacerbation, may also have PNA, unknown organism.  This developed after tapering off of steroids (01/20/18) for previous COPD exacerbation.   - Pulm Nodule: Stable??on??CT chest from??04/23/17??&??10/18/17 &??11/26/17; cont to monitor.   - CTPA??(11/26/17): No PE;??resolution of??mild upper lobe tree-in-bud opacities; Mild emphysema. Two 3 mm LLL pulmonary nodules, stable  - PFT (12/27/17):????compared to??07/2016:??FVC 3.25 L, 66%, and FEV1  1.42 L, 38%. FEV1/FVC is 44%. This is unchanged from prior study. Air trapping seen on lung volumes. Diffusion capacity 3.91, 79% of predicted, down from 5.21??  - CTPA (01/22/18):  No PE, patchy groundglass  opacities and tree-in-bud are seen throughout the  lungs which are new compared to prior examination.   - Pulm following, appreciate recs. Cont regular f/u w/ Dr. Karie Kirks on d/c  - Cont home inhalers   - Cont Xopenox q4hrs  - Cont Steroid taper: Solumedrol 80 mg TID (started 01/22/18), 40 mg TID (01/25/18), pulm tapering    ??  6. GI/Nutrition: Appetite and oral intake is good??  - Cont PPI ppx w/ steroids??  - Cont low microbial diet ??  Constipation: ??Improved  - Cont Dulcolax prn & Senokot-s 2 tabs daily    ??  7. Cardiac:??Pericarditis, A fib with RVR  - H/o HLD &??has septal wall defect on echocardiogram.  - Echo (01/23/18):  LVEF 50-55# w/ normal diastolic fxn & no obvious regional wall motion abnormalities  - Cardiology following, appreciate recs  - Cont  Lipitor??  - Cont Amiodarone & Lopressor 25 mg bid  - Cont Dilt gtt  - Cont Solumedrol COPD exacerbation & pericarditis; as tapered and if counts improve, consider colchicine d/t high relapse of pericarditis when steroids tapered  - Start Celebrex (started 01/26/18)  ??  8. Psych:  Ongoing concerns about disease progression and ability to get transplant   Anxiety/Depression: Ongoing  - Cont Zoloft??50 mg daily??  - Psych to follow   Insomnia:??No complaints??currently??  ??  9. Peripheral Neuropathy:??Ongoing  - Cont Gabapentin 300 mg??TID  ??  10. 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  11.??Acute on??Chronic left lower back pain d/t Neoplasm:????Improved w/ chemotherapy, but now w/ lateral rib pain/pleuritic pain  - S/p Radiation (09/01/17 - 09/15/17,??Fried)  - Cont Fentanyl patch??50 mcg/hr (increased 01/13/18, Rx 01/18/18)  - Cont Percocet 7.5/325 mg q4hrs prn    - DVT Prophylaxis: Platelets <50,000 cells/dL - prophylactic lovenox on hold and mechanical prophylaxis with bilateral SCDs while in bed in place.  Contraindications to pharmacologic prophylaxis: Thrombocytopenia  Contraindications to mechanical prophylaxis: None    - Disposition: Once breathing and chest pain improves     Terressa Koyanagi, MD

## 2018-01-28 NOTE — Other (Signed)
01/28/18 1149   Encounter Summary   Services provided to: Patient   Referral/Consult From: Rounding   Continue Visiting   (es 5/31)   Complexity of Encounter Moderate   Length of Encounter 15 minutes   Spiritual/Religious   Type Spiritual support   Assessment Approachable   Intervention Active listening;Prayer;Placed on communion list;Contacted support as requested per patient/family request   Who? fr dale   Why? pt is Catholic and would like priest visit   At Request Of patient   Outcome Receptive;Engaged in conversation

## 2018-01-28 NOTE — Progress Notes (Signed)
CC: COPD exacerbation    Continues to have chest discomfort, although slightly improving.  Cough is becoming more productive of purulent sputum.  Dyspnea better, although he has some limitation because of chest pain.  Continues to have real concerns about being able to have a stem cell transplant.  As we discussed trying to keep his lung disease controlled, clearly managing infection is a major problem.  He is considering retirement on his birthday in 2 weeks, avoiding unnecessary exposures to pathogens in his work environment (especially travel.)  Afebrile  WBC increasing to 0.6  Continue empiric antibiotic therapy with meropenem and anidulafungin  Methylprednisolone dose at 80 mg daily  Celebrex for serositis  BP 124/75  Sinus rhythm  S1, S2 distant  O2 saturation percent on oxygen at 2 L/m  Breath sounds mildly reduced, improved quality.  Polyphonic wheezes with cough.  Pleural friction rub heard on the left  Abdomen soft, no tenderness.  No peripheral edema.  Fluid balance about neutral  Creatinine stable 0.6.  Lites lites normal.  Glucose 220    A&P: COPD exacerbation slowly resolving.  Distinct signs of improving airflow with more productive cough.  Gradually tapering steroids.  He may benefit from chest physiotherapy  Pleuritis and pericarditis, symptomatically improved with anti-inflammatory agent.  Atrial fibrillation remains converted to NSR.  Continue Cardizem and amiodarone.  Neutropenia post chemotherapy, counts improving.   Myeloma, progressive disease

## 2018-01-28 NOTE — Plan of Care (Signed)
Nutrition Problem: Inadequate oral intake  Intervention: Food and/or Nutrient Delivery: Continue current diet, Start ONS  Nutritional Goals: Patient to tolerate and consume >75% of meals

## 2018-01-28 NOTE — Other (Signed)
01/28/18 1411   Encounter Summary   Services provided to: Patient   Volunteer Visit   (fr dale 5/31)   Sacraments   Communion Patient received communion

## 2018-01-28 NOTE — Plan of Care (Signed)
Problem: Falls - Risk of:  Goal: Will remain free from falls  Description  Will remain free from falls  Outcome: Ongoing  Patient is a high fall risk. See Lattie Corns Fall Score.   Precautions in place, pt educated on importance of calling for help before getting up.  Pt checked on frequently, tray table and call light in place.  Bed in low position.  Patient calling out appropriately.  Chair alarm on.Will continue to monitor.         Problem: Pain:  Goal: Pain level will decrease  Description  Pain level will decrease  Outcome: Ongoing   Patient reporting pain.  10/10 to rib cage coughing causes pain as well as acapella breathing treatments. Pain mediation provided, repositioned and distraction were also coping skills of the patient.       Problem: Nutrition  Goal: Optimal nutrition therapy  01/28/2018 2331 by Garald Braver Jourdain Guay  Outcome: Ongoing   Patient eating and drinking.   Problem: Cardiac:  Goal: Ability to maintain vital signs within normal range will improve  Description  Ability to maintain vital signs within normal range will improve  Outcome: Ongoing   Vitals stable this shift and WDL.     Problem: Bleeding:  Goal: Will show no signs and symptoms of excessive bleeding  Description  Will show no signs and symptoms of excessive bleeding  Outcome: Ongoing   Patient's hemoglobin this AM:   Recent Labs     01/28/18  0330   HGB 8.1*     Patient's platelet count this AM:   Recent Labs     01/28/18  0330   PLT 25*    Thrombocytopenia Precautions in place.  Patient showing no signs or symptoms of active bleeding.  Transfusion not indicated at this time.  Patient verbalizes understanding of all instructions. Will continue to assess and implement POC. Call light within reach and hourly rounding in place.        Problem: Breathing Pattern - Ineffective:  Goal: Ability to achieve and maintain a regular respiratory rate will improve  Description  Ability to achieve and maintain a regular respiratory rate will  improve  Outcome: Ongoing    Patient is on room air.  Breathing treatments provided patient is able to cough up mucous.  Acapella is also being used and this is helpful to the patient. Will continue to monitor.   Problem: PROTECTIVE PRECAUTIONS  Goal: Patient will remain free of nosocomial Infections  Outcome: Ongoing   Pt remains in protective precautions.  Pt educated on wearing mask when in hallways. Pt, staff, and visitors adhering to handwashing guidelines. Pt educated to shower or bathe daily with chlorhexidine and linens changed daily per protocol. Pt verbalizes understanding of low microbial diet. Will continue to monitor.

## 2018-01-29 LAB — CBC WITH AUTO DIFFERENTIAL
Basophils %: 0.4 %
Basophils Absolute: 0 10*3/uL (ref 0.0–0.2)
Eosinophils %: 0 %
Eosinophils Absolute: 0 10*3/uL (ref 0.0–0.6)
Hematocrit: 25 % — ABNORMAL LOW (ref 40.5–52.5)
Hemoglobin: 8.4 g/dL — ABNORMAL LOW (ref 13.5–17.5)
Lymphocytes %: 3.9 %
Lymphocytes Absolute: 0.1 10*3/uL — ABNORMAL LOW (ref 1.0–5.1)
MCH: 31.3 pg (ref 26.0–34.0)
MCHC: 33.5 g/dL (ref 31.0–36.0)
MCV: 93.6 fL (ref 80.0–100.0)
MPV: 8.9 fL (ref 5.0–10.5)
Monocytes %: 5.6 %
Monocytes Absolute: 0.1 10*3/uL (ref 0.0–1.3)
Neutrophils %: 90.1 %
Neutrophils Absolute: 1.2 10*3/uL — ABNORMAL LOW (ref 1.7–7.7)
Platelets: 22 10*3/uL — ABNORMAL LOW (ref 135–450)
RBC: 2.67 M/uL — ABNORMAL LOW (ref 4.20–5.90)
RDW: 18.1 % — ABNORMAL HIGH (ref 12.4–15.4)
WBC: 1.3 10*3/uL — ABNORMAL LOW (ref 4.0–11.0)

## 2018-01-29 LAB — BASIC METABOLIC PANEL
Anion Gap: 9 (ref 3–16)
BUN: 25 mg/dL — ABNORMAL HIGH (ref 7–20)
CO2: 27 mmol/L (ref 21–32)
Calcium: 8.6 mg/dL (ref 8.3–10.6)
Chloride: 102 mmol/L (ref 99–110)
Creatinine: <0.5 mg/dL — ABNORMAL LOW (ref 0.9–1.3)
GFR African American: >60 (ref >60)
GFR Non-African American: >60 (ref >60)
Glucose: 142 mg/dL — ABNORMAL HIGH (ref 70–99)
Potassium: 4.6 mmol/L (ref 3.5–5.1)
Sodium: 138 mmol/L (ref 136–145)

## 2018-01-29 LAB — POCT GLUCOSE
POC Glucose: 315 mg/dl — ABNORMAL HIGH (ref 70–99)
POC Glucose: 130 mg/dl — ABNORMAL HIGH (ref 70–99)
POC Glucose: 232 mg/dl — ABNORMAL HIGH (ref 70–99)
POC Glucose: 153 mg/dl — ABNORMAL HIGH (ref 70–99)

## 2018-01-29 LAB — MAGNESIUM: Magnesium: 1.9 mg/dL (ref 1.80–2.40)

## 2018-01-29 LAB — PHOSPHORUS: Phosphorus: 2 mg/dL — ABNORMAL LOW (ref 2.5–4.9)

## 2018-01-29 MED FILL — AMIODARONE HCL 200 MG PO TABS: 200 mg | ORAL | Qty: 2

## 2018-01-29 MED FILL — MEROPENEM 1 G IV SOLR: 1 g | INTRAVENOUS | Qty: 1

## 2018-01-29 MED FILL — OYSTER SHELL CALCIUM 500 MG PO TABS: 500 mg | ORAL | Qty: 1

## 2018-01-29 MED FILL — GABAPENTIN 300 MG PO CAPS: 300 mg | ORAL | Qty: 1

## 2018-01-29 MED FILL — VALACYCLOVIR HCL 500 MG PO TABS: 500 mg | ORAL | Qty: 1

## 2018-01-29 MED FILL — CELEBREX 100 MG PO CAPS: 100 mg | ORAL | Qty: 1

## 2018-01-29 MED FILL — SODIUM CHLORIDE 0.9 % IV SOLN: 0.9 % | INTRAVENOUS | Qty: 1000

## 2018-01-29 MED FILL — MORPHINE SULFATE 4 MG/ML IJ SOLN: 4 mg/mL | INTRAMUSCULAR | Qty: 1

## 2018-01-29 MED FILL — LEVALBUTEROL HCL 1.25 MG/0.5ML IN NEBU: 1.25 MG/0.5ML | RESPIRATORY_TRACT | Qty: 1

## 2018-01-29 MED FILL — METOPROLOL TARTRATE 25 MG PO TABS: 25 mg | ORAL | Qty: 1

## 2018-01-29 MED FILL — LIPITOR 20 MG PO TABS: 20 mg | ORAL | Qty: 1

## 2018-01-29 MED FILL — PANTOPRAZOLE SODIUM 40 MG PO TBEC: 40 mg | ORAL | Qty: 1

## 2018-01-29 MED FILL — MAGNESIUM SULFATE 2 GM/50ML IV SOLN: 2 GM/50ML | INTRAVENOUS | Qty: 50

## 2018-01-29 MED FILL — PERFOROMIST 20 MCG/2ML IN NEBU: 20 MCG/2ML | RESPIRATORY_TRACT | Qty: 2

## 2018-01-29 MED FILL — BUDESONIDE 0.25 MG/2ML IN SUSP: 0.25 MG/2ML | RESPIRATORY_TRACT | Qty: 2

## 2018-01-29 MED FILL — ERAXIS 100 MG IV SOLR: 100 mg | INTRAVENOUS | Qty: 30

## 2018-01-29 MED FILL — SERTRALINE HCL 50 MG PO TABS: 50 mg | ORAL | Qty: 1

## 2018-01-29 MED FILL — METHYLPREDNISOLONE SODIUM SUCC 40 MG IJ SOLR: 40 mg | INTRAMUSCULAR | Qty: 40

## 2018-01-29 MED FILL — DILTIAZEM HCL ER COATED BEADS 240 MG PO CP24: 240 mg | ORAL | Qty: 1

## 2018-01-29 MED FILL — FUROSEMIDE 10 MG/ML IJ SOLN: 10 mg/mL | INTRAMUSCULAR | Qty: 4

## 2018-01-29 MED FILL — SENNOSIDES-DOCUSATE SODIUM 8.6-50 MG PO TABS: 8.6-50 mg | ORAL | Qty: 2

## 2018-01-29 MED FILL — FENTANYL 50 MCG/HR TD PT72: 50 ug/h | TRANSDERMAL | Qty: 1

## 2018-01-29 MED FILL — LEVALBUTEROL HCL 1.25 MG/0.5ML IN NEBU: 1.25 MG/0.5ML | RESPIRATORY_TRACT | Qty: 3

## 2018-01-29 NOTE — Plan of Care (Signed)
Problem: Falls - Risk of:  Goal: Will remain free from falls  Description  Will remain free from falls  01/29/2018 0812 by Donella Stade, RN  Outcome: Ongoing  Note:   Pt is a High fall risk. See Leamon Arnt Fall Score and ABCDS Injury Risk assessments.  Explained fall risk precautions to pt and family and rationale behind their use to keep the patient safe. Pt bed is in low position, side rails up, call light and belongings are in reach. Fall wristband applied and present on pts wrist.  Bed alarm on.  Pt encouraged to call for assistance. Will continue with hourly rounds for PO intake, pain needs, toileting and repositioning as needed.        Problem: Pain:  Goal: Pain level will decrease  Description  Pain level will decrease  01/29/2018 0812 by Donella Stade, RN  Outcome: Ongoing  Note:   Pt able to appropriately rate pain on scale of 0-10. C/o pain in rib cage, PRN pain meds given per [facility] with moderate relief per pt. Educated on pain control measures, verbalized understanding. Will monitor.       Problem: Nutrition  Goal: Optimal nutrition therapy  01/29/2018 0812 by Donella Stade, RN  Outcome: Ongoing  Note:   Pt with adequate PO intake at this time. Continue to encourage pt to eat and drink.      Problem: Cardiac:  Goal: Ability to maintain vital signs within normal range will improve  Description  Ability to maintain vital signs within normal range will improve  01/29/2018 0812 by Donella Stade, RN  Outcome: Ongoing     Problem: Bleeding:  Goal: Will show no signs and symptoms of excessive bleeding  Description  Will show no signs and symptoms of excessive bleeding  01/29/2018 0812 by Donella Stade, RN  Outcome: Ongoing  Note:   Patient's hemoglobin this AM:   Recent Labs     01/29/18  0430   HGB 8.4*     Patient's platelet count this AM:   Recent Labs     01/29/18  0430   PLT 22*    Thrombocytopenia Precautions in place.  Patient showing no signs or symptoms of active bleeding.  Transfusion not indicated at  this time.  Patient verbalizes understanding of all instructions. Will continue to assess and implement POC. Call light within reach and hourly rounding in place.         Problem: Breathing Pattern - Ineffective:  Goal: Ability to achieve and maintain a regular respiratory rate will improve  Description  Ability to achieve and maintain a regular respiratory rate will improve  01/29/2018 0812 by Donella Stade, RN  Outcome: Ongoing  Note:   Patient O2 saturation WNL on RA. Still endorses productive cough, lungs with expiratory wheezes to all fields. Will monitor.     Problem: PROTECTIVE PRECAUTIONS  Goal: Patient will remain free of nosocomial Infections  01/29/2018 0812 by Donella Stade, RN  Outcome: Ongoing  Note:   Pt compliant with protective precautions, remains in private room. Pt, staff and visitors adhere to handwashing protocol. Pt verbalizes understanding of low microbial diet. Pt instructed to wear mask while in halls.      Electronically signed by Fannie Knee, RN on 01/29/2018 at 2:52 PM

## 2018-01-29 NOTE — Progress Notes (Signed)
[facility]  Cardiology Inpatient Consult Service  Daily Progress Note        Admit Date:  01/21/2018    Referring Physician: Harlene Salts, MD    Reason for Consultation/Chief Complaint:   Pericarditis/AF/HLD    Subjective:   Interval history:  Breathing is better    Medications:  ??? diltiazem  240 mg Oral Daily   ??? furosemide  40 mg Intravenous Daily   ??? amiodarone  400 mg Oral TID   ??? celecoxib  100 mg Oral BID   ??? methylPREDNISolone  40 mg Intravenous Q12H   ??? insulin lispro  0-18 Units Subcutaneous TID WC   ??? insulin lispro  0-9 Units Subcutaneous Nightly   ??? anidulafungin  100 mg Intravenous Q24H   ??? metoprolol tartrate  25 mg Oral BID   ??? atorvastatin  10 mg Oral Nightly   ??? calcium elemental  500 mg Oral BID   ??? fentaNYL  1 patch Transdermal Q72H   ??? gabapentin  300 mg Oral TID   ??? pantoprazole  40 mg Oral QAM AC   ??? sennosides-docusate sodium  2 tablet Oral Daily   ??? sertraline  50 mg Oral Daily   ??? valACYclovir  500 mg Oral BID   ??? meropenem  1 g Intravenous Q8H   ??? sodium chloride flush  10 mL Intravenous 2 times per day   ??? Saline Mouthwash  15 mL Swish & Spit 4x Daily AC & HS   ??? tiotropium  18 mcg Inhalation Daily   ??? budesonide  0.25 mg Nebulization BID    And   ??? formoterol  20 mcg Nebulization BID   ??? levalbuterol  1.25 mg Nebulization Q4H WA       IV drips:  ??? dextrose     ??? sodium chloride 50 mL/hr at 01/29/18 0045       PRN:  acetaminophen, diphenhydrAMINE, potassium phosphate IVPB, morphine **OR** morphine, glucose, dextrose, glucagon (rDNA), dextrose, potassium chloride, magnesium sulfate, LORazepam, albuterol, bisacodyl, ondansetron, oxyCODONE-acetaminophen, prochlorperazine, sodium chloride flush, potassium chloride, magnesium sulfate, magnesium hydroxide, Saline Mouthwash, alteplase, LORazepam      Objective:     Vitals:    01/29/18 0814 01/29/18 0822 01/29/18 0851 01/29/18 0932   BP:   124/74    Pulse:   71    Resp: 14 14 16     Temp:   98 ??F (36.7 ??C)     TempSrc:   Oral    SpO2:   92%    Weight:    201 lb 1 oz (91.2 kg)   Height:           Intake/Output Summary (Last 24 hours) at 01/29/2018 1019  Last data filed at 01/29/2018 1013  Gross per 24 hour   Intake 1730 ml   Output 4175 ml   Net -2445 ml     I/O last 3 completed shifts:  In: 4696 [P.O.:716; I.V.:1014]  Out: 2550 [Urine:2550]  Wt Readings from Last 3 Encounters:   01/29/18 201 lb 1 oz (91.2 kg)   01/18/18 194 lb 9.6 oz (88.3 kg)   01/10/18 194 lb 12.8 oz (88.4 kg)       Admit Wt: Weight: 194 lb 10.7 oz (88.3 kg)   Todays Wt: Weight: 201 lb 1 oz (91.2 kg)    TELEMETRY: Sinus     Physical Exam:     General:  Awake, alert, NAD  Skin:  Warm and dry  Neck:  -JVP  Chest:  Clear to auscultation, respiration normal  Cardiovascular:  RRR S1S2  Abdomen:  Soft -  Extremities:  - edema      Objective    Labs:   Recent Labs     01/27/18  0339 01/27/18  0412 01/28/18  0330 01/29/18  0430   NA  --  135* 137 138   K  --  4.1 4.5 4.6   BUN  --  26* 26* 25*   CREATININE  --  0.6* 0.6* <0.5*   CL  --  100 102 102   CO2  --  26 26 27    GLUCOSE  --  210* 237* 142*   CALCIUM  --  7.9* 8.1* 8.6   MG 2.00  --  2.00 1.90     Recent Labs     01/27/18  0339 01/28/18  0330 01/29/18  0430   WBC 0.4* 0.6* 1.3*   HGB 7.9* 8.1* 8.4*   HCT 23.3* 24.0* 25.0*   PLT 10* 25* 22*   MCV 92.9 93.6 93.6     No results for input(s): CHOLTOT, TRIG, HDL in the last 72 hours.    Invalid input(s): LIPIDCOMM, CHOLHDL, VLDCHOL, LDL  Recent Labs     01/27/18  0339   INR 1.11     No results for input(s): CKTOTAL, CKMB, CKMBINDEX, TROPONINI in the last 72 hours.  No results for input(s): BNP in the last 72 hours.  No results for input(s): NTPROBNP in the last 72 hours.  No results for input(s): TSH in the last 72 hours.    Imaging:   I personally reviewed imaging studies including CXR, Stress test, TTE/TEE.      Assessment & Plan:     Pericarditis/AF/HLD  - Continue amiodarone TID today, tomorrow 200 qd  - continue metoprolol and diltiazem  - not OAC 2/2  blood count  - breathing is better    Thank you for allowing to [place] to participate in the care of David Watkins. Please call our service with questions.    All questions and concerns were addressed to the patient/family. Alternatives to my treatment were discussed. The note was completed using EMR. Every effort was made to ensure accuracy; however, inadvertent computerized transcription errors may be present.    David Heizer R. Eugenio Hoes, MD, [facility], [facility]    01/29/2018 10:19 AM

## 2018-01-29 NOTE — Progress Notes (Signed)
Pulmonary Followup Note    CC: COPD exacerbation, pneumonia, neutropenic fever and myeloma  Subjective:  Says he's feeling better than when he came in.  However, still having cough and wheezing and appears dyspneic.  Says dyspnea is improving as he can walk to the bathroom without panting.  Has pain in his back which is chronic.    ROS:  Denies headache, nausea or chest pain.    24HR INTAKE/OUTPUT:      Intake/Output Summary (Last 24 hours) at 01/29/2018 1304  Last data filed at 01/29/2018 1230  Gross per 24 hour   Intake 1854 ml   Output 4775 ml   Net -2921 ml       ??? diltiazem  240 mg Oral Daily   ??? furosemide  40 mg Intravenous Daily   ??? amiodarone  400 mg Oral TID   ??? celecoxib  100 mg Oral BID   ??? methylPREDNISolone  40 mg Intravenous Q12H   ??? insulin lispro  0-18 Units Subcutaneous TID WC   ??? insulin lispro  0-9 Units Subcutaneous Nightly   ??? anidulafungin  100 mg Intravenous Q24H   ??? metoprolol tartrate  25 mg Oral BID   ??? atorvastatin  10 mg Oral Nightly   ??? calcium elemental  500 mg Oral BID   ??? fentaNYL  1 patch Transdermal Q72H   ??? gabapentin  300 mg Oral TID   ??? pantoprazole  40 mg Oral QAM AC   ??? sennosides-docusate sodium  2 tablet Oral Daily   ??? sertraline  50 mg Oral Daily   ??? valACYclovir  500 mg Oral BID   ??? meropenem  1 g Intravenous Q8H   ??? sodium chloride flush  10 mL Intravenous 2 times per day   ??? Saline Mouthwash  15 mL Swish & Spit 4x Daily AC & HS   ??? tiotropium  18 mcg Inhalation Daily   ??? budesonide  0.25 mg Nebulization BID    And   ??? formoterol  20 mcg Nebulization BID   ??? levalbuterol  1.25 mg Nebulization Q4H WA           PHYSICAL EXAMINATION:  BP 117/78    Pulse 72    Temp 98 ??F (36.7 ??C) (Oral)    Resp 13    Ht 5' 9"  (1.753 m)    Wt 201 lb 1 oz (91.2 kg)    SpO2 96%    BMI 29.69 kg/m??   CURRENT PULSE OXIMETRY:  SpO2: 96 %  24HR PULSE OXIMETRY RANGE:  SpO2  Avg: 95.3 %  Min: 92 %  Max: 96 % on ra      Gen: mild distress. Speaking in full sentences  with trace accessory muscle use  HEENT: PERRL, EOMI, OP nl  Lung:inspiratory ronchi and expiratory wheezing throughout  CV: RRR without M/R/R  Abd: +BS, soft, NT/ND  Ext: No edema.    DATA  CBC:   Recent Labs     01/27/18  0339 01/28/18  0330 01/29/18  0430   WBC 0.4* 0.6* 1.3*   HGB 7.9* 8.1* 8.4*   HCT 23.3* 24.0* 25.0*   MCV 92.9 93.6 93.6   PLT 10* 25* 22*     BMP:   Recent Labs     01/27/18  0339 01/27/18  0412 01/28/18  0330 01/29/18  0430   NA  --  135* 137 138   K  --  4.1 4.5 4.6   CL  --  100  102 102   CO2  --  26 26 27    PHOS 1.9*  --  1.6* 2.0*   BUN  --  26* 26* 25*   CREATININE  --  0.6* 0.6* <0.5*     No results for input(s): PHART, PCO2ART, PO2ART in the last 72 hours.  LIVER PROFILE:   Recent Labs     01/28/18  0330   AST 20   ALT 63*   BILIDIR <0.2   BILITOT 0.3   ALKPHOS 101       CXR REVIEWED BY ME AND SHOWED:  CT CHEST W CONTRAST   Final Result   1. Interval development of multiple pulmonary nodules seen scattered throughout both lungs. There is also some nodular areas of patchy consolidation within the left lung and some interstitial infiltrate within the right upper lobe periphery. Findings are    nonspecific but favor an inflammatory etiology such as an atypical pneumonia or bronchiolitis. Interval development of pulmonary metastatic disease is considered less likely.   2. Moderate emphysema.   3. Left axillary lymphadenopathy is mildly decreased in comparison the prior study.   4. Previously seen abnormal paraspinal soft tissue identified along the lower thoracic spine has decreased in size and only some small ill-defined nodular soft tissue remains. Findings are consistent with neoplastic disease which is responding to    therapy.   5. Stable osseous metastatic disease.      XR CHEST PORTABLE   Final Result      No acute pulmonary pathology or change from the prior study                  CTA PULMONARY W CONTRAST   Final Result   1.  No pulmonary embolism.   2.  Patchy groundglass opacities  and tree-in-bud are seen throughout the    lungs which are new compared to prior examination.  This most likely    represents atypical infection/inflammation.  Clinical correlation is    recommended.   3.  Paraseptal and centrilobular emphysematous change of the lungs.   4.  Enlarged left axillary lymph nodes are not significantly changed    compared to PET/CT on 01/10/2018.  This is worrisome for nodal metastasis.   5.  Scattered lucent lesions are seen throughout the ribs, spine, and    scapula.  Findings may represent multiple myeloma or osseous metastasis.          XR CHEST STANDARD (2 VW)   Final Result      No acute cardiopulmonary findings.           ASSESSMENT/PLAN:  This is a 62 y.o. male with Fever, pulmonary nodules and multiple myeloma    Patient no longer on oxygen but his lung exam is still markedly abnormal and he appears short of breath.    Appropriately on steroids, meropenem and antifungals for positive galactomannan and fungitel.    Slow to respond unfortunately so we discussed potential need for a bronchoscopy.  However, with his immune system and a possible fungal pneumonia he will be slow to respond so utility of bronchoscopy is suspect at this time.        Neville Route, MD

## 2018-01-29 NOTE — Plan of Care (Signed)
Problem: Falls - Risk of:  Goal: Will remain free from falls  Description  Will remain free from falls  01/29/2018 0812 by Max Fickle, RN  Outcome: Ongoing  Note:   Pt is a High fall risk. See Lattie Corns Fall Score and ABCDS Injury Risk assessments.  Explained fall risk precautions to pt and family and rationale behind their use to keep the patient safe. Pt bed is in low position, side rails up, call light and belongings are in reach. Fall wristband applied and present on pts wrist.  Bed alarm on.  Pt encouraged to call for assistance. Will continue with hourly rounds for PO intake, pain needs, toileting and repositioning as needed.        Problem: Pain:  Goal: Pain level will decrease  Description  Pain level will decrease  01/29/2018 0812 by Max Fickle, RN  Outcome: Ongoing  Note:   Patient c/o ribcage pain, rated 8/10. PRN morphine 4mg  administered per order. Upon reassessment, pt was asleep with RR greater than 10. Will continue to monitor.      Problem: Nutrition  Goal: Optimal nutrition therapy  01/29/2018 0812 by Max Fickle, RN  Outcome: Ongoing  Note:   Patient has not eaten this shift. Tolerating PO fluids.      Problem: Cardiac:  Goal: Ability to maintain vital signs within normal range will improve  Description  Ability to maintain vital signs within normal range will improve  01/29/2018 0812 by Max Fickle, RN  Outcome: Ongoing     Problem: Bleeding:  Goal: Will show no signs and symptoms of excessive bleeding  Description  Will show no signs and symptoms of excessive bleeding  01/29/2018 0812 by Max Fickle, RN  Outcome: Ongoing  Note:   Patient's hemoglobin this AM:   Recent Labs     01/29/18  0430   HGB 8.4*     Patient's platelet count this AM:   Recent Labs     01/29/18  0430   PLT 22*    Thrombocytopenia Precautions in place.  Patient showing no signs or symptoms of active bleeding.  Transfusion not indicated at this time.  Patient verbalizes understanding of all instructions. Will  continue to assess and implement POC. Call light within reach and hourly rounding in place.         Problem: Breathing Pattern - Ineffective:  Goal: Ability to achieve and maintain a regular respiratory rate will improve  Description  Ability to achieve and maintain a regular respiratory rate will improve  01/29/2018 0812 by Max Fickle, RN  Outcome: Ongoing  Note:   Patient on room air, but still reports productive cough. Oxygen saturation levels remained above 92% this shift.      Problem: PROTECTIVE PRECAUTIONS  Goal: Patient will remain free of nosocomial Infections  01/29/2018 0812 by Max Fickle, RN  Outcome: Ongoing  Note:   Pt remains in neutropenic precautions per floor policy. Pt, visitors, and staff noted to be following precautions appropriately. Handwashing in place; pt in private room. Low microbial diet in place. Will continue to monitor.

## 2018-01-29 NOTE — Progress Notes (Signed)
[facility] Progress Note    01/29/2018     David Watkins    MRN: [id number]    DOB: Jan 21, 1956    SUBJECTIVE:  He feels better. His previously noted sob cont to improve. Persistent productive cough    ECOG PS:  (2) Ambulatory and capable of self care, unable to carry out work activity, up and about > 50% or waking hours    Isolation: None    Medications    Scheduled Meds:  . diltiazem  240 mg Oral Daily   . furosemide  40 mg Intravenous Daily   . amiodarone  400 mg Oral TID   . celecoxib  100 mg Oral BID   . methylPREDNISolone  40 mg Intravenous Q12H   . insulin lispro  0-18 Units Subcutaneous TID WC   . insulin lispro  0-9 Units Subcutaneous Nightly   . anidulafungin  100 mg Intravenous Q24H   . metoprolol tartrate  25 mg Oral BID   . atorvastatin  10 mg Oral Nightly   . calcium elemental  500 mg Oral BID   . fentaNYL  1 patch Transdermal Q72H   . gabapentin  300 mg Oral TID   . pantoprazole  40 mg Oral QAM AC   . sennosides-docusate sodium  2 tablet Oral Daily   . sertraline  50 mg Oral Daily   . valACYclovir  500 mg Oral BID   . meropenem  1 g Intravenous Q8H   . sodium chloride flush  10 mL Intravenous 2 times per day   . Saline Mouthwash  15 mL Swish & Spit 4x Daily AC & HS   . tiotropium  18 mcg Inhalation Daily   . budesonide  0.25 mg Nebulization BID    And   . formoterol  20 mcg Nebulization BID   . levalbuterol  1.25 mg Nebulization Q4H WA     Continuous Infusions:  . dextrose     . sodium chloride 50 mL/hr at 01/29/18 0045     PRN Meds:.acetaminophen, diphenhydrAMINE, potassium phosphate IVPB, morphine **OR** morphine, glucose, dextrose, glucagon (rDNA), dextrose, potassium chloride, magnesium sulfate, LORazepam, albuterol, bisacodyl, ondansetron, oxyCODONE-acetaminophen, prochlorperazine, sodium chloride flush, potassium chloride, magnesium sulfate, magnesium hydroxide, Saline Mouthwash, alteplase, LORazepam    ROS:  As noted above, otherwise remainder of 10-point ROS negative    Physical Exam:     I&O:       Intake/Output Summary (Last 24 hours) at 01/29/2018 1131  Last data filed at 01/29/2018 1055  Gross per 24 hour   Intake 1494 ml   Output 4175 ml   Net -2681 ml       Vital Signs:  BP 124/74   Pulse 71   Temp 98 F (36.7 C) (Oral)   Resp 16   Ht 5\' 9"  (1.753 m)   Wt 201 lb 1 oz (91.2 kg)   SpO2 92%   BMI 29.69 kg/m     Weight:    Wt Readings from Last 3 Encounters:   01/29/18 201 lb 1 oz (91.2 kg)   01/18/18 194 lb 9.6 oz (88.3 kg)   01/10/18 194 lb 12.8 oz (88.4 kg)           General: Awake, alert and oriented.  HEENT:normocephalic, alopecia,PERRL, no scleral erythema or icterus, Oral mucosa moist and intact  LYMPH: Left axillary lymphadenopathy  NECK: supple without palpable adenopathy  BACK: Straight negative CVAT  SKIN:warm dry and intact without lesions rashes or masses  CHEST:rhonchi w/ insp/exp  wheeze,without use of accessory muscles  CV: Normal S1 S2, irregular, no MRG  ABD:NT ND normoactive BS, no palpable masses or hepatosplenomegaly  EXTREMITIES:without edema, denies calf tenderness  NEURO: CN II - XII grossly intact  CATHETER:Right IJ PAC (06/21/17, [facility])- CDI        Data    CBC:   Recent Labs     01/27/18  0339 01/28/18  0330 01/29/18  0430   WBC 0.4* 0.6* 1.3*   HGB 7.9* 8.1* 8.4*   HCT 23.3* 24.0* 25.0*   MCV 92.9 93.6 93.6   PLT 10* 25* 22*     BMP/Mag:  Recent Labs     01/27/18  0339 01/27/18  0412 01/28/18  0330 01/29/18  0430   NA  --  135* 137 138   K  --  4.1 4.5 4.6   CL  --  100 102 102   CO2  --  26 26 27    PHOS 1.9*  --  1.6* 2.0*   BUN  --  26* 26* 25*   CREATININE  --  0.6* 0.6* <0.5*   MG 2.00  --  2.00 1.90     LIVP:   Recent Labs     01/28/18  0330   AST 20   ALT 63*   BILIDIR <0.2   BILITOT 0.3   ALKPHOS 101     Coags:   Recent Labs     01/27/18  0339   PROTIME 12.7   INR 1.11   APTT 26.0     Uric Acid   Recent Labs     01/28/18  0330   LABURIC 2.1*       Diagnostics:  1. PET scan (01/10/18):        2.  CTA Chest (525/19):  1. No pulmonary embolism.  2.  Patchy groundglass opacities and tree-in-bud are seen throughout the   lungs which are new compared to prior examination. This most likely   represents atypical infection/inflammation. Clinical correlation is   recommended.  3. Paraseptal and centrilobular emphysematous change of the lungs.  4. Enlarged left axillary lymph nodes are not significantly changed   compared to PET/CT on 01/10/2018. This is worrisome for nodal metastasis.  5. Scattered lucent lesions are seen throughout the ribs, spine, and   scapula. Findings may represent multiple myeloma or osseous metastasis.    3.  Echocardiogram (01/23/18):  Technically difficult examination.  Normal left ventricle size, wall thickness, and systolic function with an  estimated ejection fraction of 50-55%. No obvious regional wall motion  abnormalities are seen (inferior and inferoseptal walls do appear normal, as  clinically questioned). Diastolic filling parameters suggests normal  diastolic function.  Aortic valve appears sclerotic but opens adequately.  Possible small/organized pericardial effusion though fat pad cannot be ruled  out.    Myeloma Labs (01/03/18):  SPEP:reveals that M2, previously characterized as monoclonal IgG kappa in  mid-to-slow gamma is 1.0 gm/dL, greater than the 0.2 gm/dL observed on 08 Nov 9145.  SIFE:Serum immunofixation electrophoresis reveals persistent M2 in mid-to-slow gamma, a monoclonal IgG kappa. A recent M-spike, M1, was minor monoclonal IgG lambda in mid-gamma; M1 continues to be absent.  SFLC:  Kappa:  30.80, previously (11/08/17) -10.40  Lambda:  1.46, previously (11/08/17) -5.97  Free Kappa Lambda Ratio:  21.10, previously (11/08/17) -1.74  Quantitative Immunoglobulins:  IgG:  1480, previously (11/08/17) - 552  IgA:  <26, previously (11/08/17) -30  IgM:  <20    PROBLEM  LIST:      1.IgG Kappa MultipleMyeloma/Plasma Cell Leukemia (Dx8/2018)  2.Peripheral neuropathy  3.  Anxiety/Depression  4.Hyperlipidemia  5.Hypertension  6.Insomnia  7.Chronic lowback paind/t neoplasm (h/o lytic lesions&cord compression @T6  & T11)  8. COPD   9.Influenza A(10/12/17)  10.  COPD Exacerbation (12/2017)  11.  Pericarditis (12/2017)  12.  A. Fib w/ RVR (12/2017)    TREATMENT:      1. Rad Tx to T5-7, T10-L3, Right Scapula - 3000 cGy - Dr. Bonita Quin 03/20/16-04/01/16  2. RVD x1 03/20/16 - discontinued d/t rash   3. Velcade/Pomalyst/Dex x3 cycles 04/23/16-06/17/16 - discontinued d/t reaction to pomalyst  4. Velcade/Dex x 1 cycle 06/26/16 (last dose of dexamethasone 07/22/16  5. High-dose melphalan followed by administration of PBSCs 2.36 x10^6 cd34cells/kg on 08/28/16  6. Maintenance Revlimid 10mg  daily (12/2016-04/23/17)  7. Dexamethasone 40mg  daily (04/24/17)  8. DCEP   Cycle #1 - 04/26/17  Cycle #2 - 05/25/17 - excellent response  9.Dara/Velcade/Dex (C1D1 - 06/22/17) - BMBx 10/18/17 - CR  Cycles 7-8 (21 day cycle)  - Dex 20 mg po D4,5.8,9,11 and 12  - Dara 16 mg/Kg D1 only with Dex 20 mg IV  - Velcade 1.3 mg/M2 D1,4,8 and 11  Cycle 9 and beyond (maintenance, 11/08/17- 01/03/18)  Dara 16 mg/Kg Q 28 days  Dex 12 mg IV D1  Velcade Q 2 weeks  10. DCEP   Cycle #1 - 01/13/18    ASSESSMENT AND PLAN:     1. IgG kappa multiple myeloma / Plasma Cell Leukemia:Relapsed disease  - S/ptreatmentDara/Velcade/Dexx 8 cycles(06/22/17 - 10/2017). Followed bymaintenance DaraQ4 wks/VelcadeQ2ks/Dex(started 11/08/17)  - Now w/ progressive disease based on myeloma labs (01/03/18) & PET scan (01/10/18), see above    PLAN:DCEP for disease and pain control. Hope for allogeneic transplant in CR2, but needs to disease response and improvement in lung function    DCEP Cycle #1, Day + 17    Myeloma labs 5/31    2. ID: Afebrile w/ possibly multifocal PNA, unknown organism and pericarditis. He is high risk for fungal PNA d/t long term steroids   - Fungitell and galactomannan (01/25/18) - Pending   - Cont  Valtrex ppx  - Cont Meropenem Day + 8  - Eraxis Day + 5    3. Heme:Chemotherapy induced pancytopenia   - Transfuse for Hgb < 7 and Platelets <10K  - No transfusion today  - S/p Neulasta (01/19/18)    4.Metabolic: hypervolemia + fair uop + steroid-induced hyperglycemia and hypoPhos  - Cont high regimen Lispro SSI, AC&HS  - Cont Lasix 40 mg IV daily (started 01/25/18)   - Cont IVF:  NS @ 50 mL/hr  - Keep Mg > 2 and K+ > 4.0 and Phos > 2.0    5. Pulmonary:Acute respiratory distress with mild hypercapnia and hypoxemia from COPD exacerbation, may also have PNA, unknown organism.  This developed after tapering off of steroids (01/20/18) for previous COPD exacerbation.   - Pulm Nodule: StableonCT chest from8/24/18&10/18/17 &11/26/17; cont to monitor.   - CTPA(11/26/17): No PE;resolution ofmild upper lobe tree-in-bud opacities; Mild emphysema. Two 3 mm LLL pulmonary nodules, stable  - PFT (12/27/17):compared to11/2017:FVC 3.25 L, 66%, and FEV1  1.42 L, 38%. FEV1/FVC is 44%. This is unchanged from prior study. Air trapping seen on lung volumes. Diffusion capacity 3.91, 79% of predicted, down from 5.21  - CTPA (01/22/18):  No PE, patchy groundglass opacities and tree-in-bud are seen throughout the  lungs which are new compared to prior examination.   -  Pulm following, appreciate recs. Cont regular f/u w/ Dr. Karie Kirks on d/c  - Cont home inhalers   - Cont Xopenox q4hrs  - Cont Steroid taper: Solumedrol 80 mg TID (started 01/22/18), 40 mg TID (01/25/18), pulm tapering      6. GI/Nutrition: Appetite and oral intake is good  - Cont PPI ppx w/ steroids  - Cont low microbial diet   Constipation: Improved  - Cont Dulcolax prn & Senokot-s 2 tabs daily      7. 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  - Echo (01/23/18):  LVEF 50-55# w/ normal diastolic fxn & no obvious regional wall motion abnormalities  - Cardiology following, appreciate recs  - Cont Lipitor  - Cont  Amiodarone & Lopressor 25 mg bid  - Cont Dilt gtt  - Cont Solumedrol COPD exacerbation & pericarditis; as tapered and if counts improve, consider colchicine d/t high relapse of pericarditis when steroids tapered  - Start Celebrex (started 01/26/18)    8. Psych:  Ongoing concerns about disease progression and ability to get transplant   Anxiety/Depression: Ongoing  - Cont Zoloft50 mg daily  - Psych to follow   Insomnia:No complaintscurrently    9. Peripheral Neuropathy:Ongoing  - Cont Gabapentin 300 mgTID    10. Bone Health:No acute fx   -H/o spinal cord compression & diffuse lytic lesions t/o skeleton  - CT Chest (04/23/17) - multiple lytic lesions throughout the skeleton   - Cont Ca/Vit D &Zometa monthly (given5/6/19, next due6/3/19)    11.Acute onChronic left lower back pain d/t Neoplasm:Improved w/ chemotherapy, but now w/ lateral rib pain/pleuritic pain  - S/p Radiation (09/01/17 - 09/15/17,Fried)  - Cont Fentanyl patch50 mcg/hr (increased 01/13/18, Rx 01/18/18)  - Cont Percocet 7.5/325 mg q4hrs prn    - DVT Prophylaxis: Platelets <50,000 cells/dL - prophylactic lovenox on hold and mechanical prophylaxis with bilateral SCDs while in bed in place.  Contraindications to pharmacologic prophylaxis: Thrombocytopenia  Contraindications to mechanical prophylaxis: None    - Disposition: Once breathing and chest pain improves     Lisette Grinder, MD

## 2018-01-30 LAB — POCT GLUCOSE
POC Glucose: 177 mg/dl — ABNORMAL HIGH (ref 70–99)
POC Glucose: 224 mg/dl — ABNORMAL HIGH (ref 70–99)
POC Glucose: 218 mg/dl — ABNORMAL HIGH (ref 70–99)
POC Glucose: 234 mg/dl — ABNORMAL HIGH (ref 70–99)

## 2018-01-30 LAB — ELECTROPHORESIS PROTEIN, SERUM
Albumin: 2.2 g/dL — ABNORMAL LOW (ref 3.1–4.9)
Alpha-1-Globulin: 0.4 g/dL (ref 0.2–0.4)
Alpha-2-Globulin: 1.2 g/dL — ABNORMAL HIGH (ref 0.4–1.1)
Beta Globulin: 0.8 g/dL — ABNORMAL LOW (ref 0.9–1.6)
Gamma Globulin: 0.6 g/dL (ref 0.6–1.8)
M Spike 1, Electrophoresis Protein Serum: 0.5 g/dL
Total Protein: 5.3 g/dL — ABNORMAL LOW (ref 6.4–8.2)

## 2018-01-30 LAB — BASIC METABOLIC PANEL
Anion Gap: 10 (ref 3–16)
BUN: 29 mg/dL — ABNORMAL HIGH (ref 7–20)
CO2: 27 mmol/L (ref 21–32)
Calcium: 8.6 mg/dL (ref 8.3–10.6)
Chloride: 100 mmol/L (ref 99–110)
Creatinine: <0.5 mg/dL — ABNORMAL LOW (ref 0.9–1.3)
GFR African American: >60 (ref >60)
GFR Non-African American: >60 (ref >60)
Glucose: 240 mg/dL — ABNORMAL HIGH (ref 70–99)
Potassium: 4.6 mmol/L (ref 3.5–5.1)
Sodium: 137 mmol/L (ref 136–145)

## 2018-01-30 LAB — CBC WITH AUTO DIFFERENTIAL
Bands Relative: 8 % — ABNORMAL HIGH (ref 0–7)
Basophils %: 0 %
Basophils Absolute: 0 10*3/uL (ref 0.0–0.2)
Eosinophils %: 0 %
Eosinophils Absolute: 0 10*3/uL (ref 0.0–0.6)
Hematocrit: 25.5 % — ABNORMAL LOW (ref 40.5–52.5)
Hemoglobin: 8.7 g/dL — ABNORMAL LOW (ref 13.5–17.5)
Lymphocytes %: 3 %
Lymphocytes Absolute: 0.1 10*3/uL — ABNORMAL LOW (ref 1.0–5.1)
MCH: 31.5 pg (ref 26.0–34.0)
MCHC: 34 g/dL (ref 31.0–36.0)
MCV: 92.7 fL (ref 80.0–100.0)
MPV: 9.2 fL (ref 5.0–10.5)
Metamyelocytes Relative: 2 % — AB
Monocytes %: 13 %
Monocytes Absolute: 0.3 10*3/uL (ref 0.0–1.3)
Neutrophils %: 74 %
Neutrophils Absolute: 1.8 10*3/uL (ref 1.7–7.7)
Platelets: 18 10*3/uL — CL (ref 135–450)
RBC: 2.75 M/uL — ABNORMAL LOW (ref 4.20–5.90)
RDW: 18.2 % — ABNORMAL HIGH (ref 12.4–15.4)
WBC: 2.2 10*3/uL — ABNORMAL LOW (ref 4.0–11.0)

## 2018-01-30 LAB — MAGNESIUM: Magnesium: 2 mg/dL (ref 1.80–2.40)

## 2018-01-30 LAB — PHOSPHORUS: Phosphorus: 2.5 mg/dL (ref 2.5–4.9)

## 2018-01-30 MED ORDER — AMIODARONE HCL 200 MG PO TABS
200 MG | Freq: Every day | ORAL | Status: DC
Start: 2018-01-30 — End: 2018-01-31
  Administered 2018-01-30: 15:00:00 200 mg via ORAL

## 2018-01-30 MED ORDER — GUAIFENESIN ER 600 MG PO TB12
600 MG | Freq: Two times a day (BID) | ORAL | Status: DC
Start: 2018-01-30 — End: 2018-02-08
  Administered 2018-01-30 – 2018-02-08 (×19): 600 mg via ORAL

## 2018-01-30 MED FILL — PERFOROMIST 20 MCG/2ML IN NEBU: 20 MCG/2ML | RESPIRATORY_TRACT | Qty: 2

## 2018-01-30 MED FILL — METHYLPREDNISOLONE SODIUM SUCC 40 MG IJ SOLR: 40 mg | INTRAMUSCULAR | Qty: 40

## 2018-01-30 MED FILL — CELEBREX 100 MG PO CAPS: 100 mg | ORAL | Qty: 1

## 2018-01-30 MED FILL — FUROSEMIDE 10 MG/ML IJ SOLN: 10 mg/mL | INTRAMUSCULAR | Qty: 4

## 2018-01-30 MED FILL — PANTOPRAZOLE SODIUM 40 MG PO TBEC: 40 mg | ORAL | Qty: 1

## 2018-01-30 MED FILL — VALACYCLOVIR HCL 500 MG PO TABS: 500 mg | ORAL | Qty: 1

## 2018-01-30 MED FILL — METOPROLOL TARTRATE 25 MG PO TABS: 25 mg | ORAL | Qty: 1

## 2018-01-30 MED FILL — MEROPENEM 1 G IV SOLR: 1 g | INTRAVENOUS | Qty: 1

## 2018-01-30 MED FILL — AMIODARONE HCL 200 MG PO TABS: 200 mg | ORAL | Qty: 2

## 2018-01-30 MED FILL — BUDESONIDE 0.25 MG/2ML IN SUSP: 0.25 MG/2ML | RESPIRATORY_TRACT | Qty: 2

## 2018-01-30 MED FILL — MORPHINE SULFATE 4 MG/ML IJ SOLN: 4 mg/mL | INTRAMUSCULAR | Qty: 1

## 2018-01-30 MED FILL — MUCINEX 600 MG PO TB12: 600 mg | ORAL | Qty: 1

## 2018-01-30 MED FILL — LIPITOR 20 MG PO TABS: 20 mg | ORAL | Qty: 1

## 2018-01-30 MED FILL — SERTRALINE HCL 50 MG PO TABS: 50 mg | ORAL | Qty: 1

## 2018-01-30 MED FILL — SODIUM CHLORIDE 0.9 % IV SOLN: 0.9 % | INTRAVENOUS | Qty: 1000

## 2018-01-30 MED FILL — GABAPENTIN 300 MG PO CAPS: 300 mg | ORAL | Qty: 1

## 2018-01-30 MED FILL — ONDANSETRON HCL 8 MG PO TABS: 8 mg | ORAL | Qty: 1

## 2018-01-30 MED FILL — DILTIAZEM HCL ER COATED BEADS 240 MG PO CP24: 240 mg | ORAL | Qty: 1

## 2018-01-30 MED FILL — LEVALBUTEROL HCL 1.25 MG/0.5ML IN NEBU: 1.25 MG/0.5ML | RESPIRATORY_TRACT | Qty: 1

## 2018-01-30 MED FILL — OYSTER SHELL CALCIUM 500 MG PO TABS: 500 mg | ORAL | Qty: 1

## 2018-01-30 MED FILL — OXYCODONE-ACETAMINOPHEN 7.5-325 MG PO TABS: 7.5-325 mg | ORAL | Qty: 1

## 2018-01-30 MED FILL — AMIODARONE HCL 200 MG PO TABS: 200 mg | ORAL | Qty: 1

## 2018-01-30 MED FILL — ERAXIS 100 MG IV SOLR: 100 mg | INTRAVENOUS | Qty: 30

## 2018-01-30 MED FILL — SENNOSIDES-DOCUSATE SODIUM 8.6-50 MG PO TABS: 8.6-50 mg | ORAL | Qty: 2

## 2018-01-30 NOTE — Progress Notes (Signed)
Pulmonary Followup Note    CC: COPD exacerbation, pneumonia, neutropenic fever and myeloma  Subjective:  Reports continued improvement.  However, still having cough and wheezing.  Says dyspnea is improving as he can walk even further without panting.  Has pain in his back which is chronic.    ROS:  Denies headache, nausea or chest pain.    24HR INTAKE/OUTPUT:      Intake/Output Summary (Last 24 hours) at 01/30/2018 1450  Last data filed at 01/30/2018 1121  Gross per 24 hour   Intake 2179 ml   Output 1925 ml   Net 254 ml       ??? amiodarone  200 mg Oral Daily   ??? guaiFENesin  600 mg Oral BID   ??? diltiazem  240 mg Oral Daily   ??? furosemide  40 mg Intravenous Daily   ??? celecoxib  100 mg Oral BID   ??? methylPREDNISolone  40 mg Intravenous Q12H   ??? insulin lispro  0-18 Units Subcutaneous TID WC   ??? insulin lispro  0-9 Units Subcutaneous Nightly   ??? anidulafungin  100 mg Intravenous Q24H   ??? metoprolol tartrate  25 mg Oral BID   ??? atorvastatin  10 mg Oral Nightly   ??? calcium elemental  500 mg Oral BID   ??? fentaNYL  1 patch Transdermal Q72H   ??? gabapentin  300 mg Oral TID   ??? pantoprazole  40 mg Oral QAM AC   ??? sennosides-docusate sodium  2 tablet Oral Daily   ??? sertraline  50 mg Oral Daily   ??? valACYclovir  500 mg Oral BID   ??? meropenem  1 g Intravenous Q8H   ??? sodium chloride flush  10 mL Intravenous 2 times per day   ??? Saline Mouthwash  15 mL Swish & Spit 4x Daily AC & HS   ??? tiotropium  18 mcg Inhalation Daily   ??? budesonide  0.25 mg Nebulization BID    And   ??? formoterol  20 mcg Nebulization BID   ??? levalbuterol  1.25 mg Nebulization Q4H WA           PHYSICAL EXAMINATION:  BP 102/61    Pulse 76    Temp 97.7 ??F (36.5 ??C) (Oral)    Resp 15    Ht 5' 9"  (1.753 m)    Wt 192 lb (87.1 kg)    SpO2 96%    BMI 28.35 kg/m??   CURRENT PULSE OXIMETRY:  SpO2: 96 %  24HR PULSE OXIMETRY RANGE:  SpO2  Avg: 96.3 %  Min: 93 %  Max: 98 % on ra      Gen: mild distress. Speaking in full sentences with trace  accessory muscle use  HEENT: PERRL, EOMI, OP nl  Lung:inspiratory ronchi and expiratory wheezing throughout  CV: RRR without M/R/R  Abd: +BS, soft, NT/ND  Ext: No edema.    DATA  CBC:   Recent Labs     01/28/18  0330 01/29/18  0430 01/30/18  0330   WBC 0.6* 1.3* 2.2*   HGB 8.1* 8.4* 8.7*   HCT 24.0* 25.0* 25.5*   MCV 93.6 93.6 92.7   PLT 25* 22* 18*     BMP:   Recent Labs     01/28/18  0330 01/29/18  0430 01/30/18  0330   NA 137 138 137   K 4.5 4.6 4.6   CL 102 102 100   CO2 26 27 27    PHOS 1.6* 2.0* 2.5  BUN 26* 25* 29*   CREATININE 0.6* <0.5* <0.5*     No results for input(s): PHART, PCO2ART, PO2ART in the last 72 hours.  LIVER PROFILE:   Recent Labs     01/28/18  0330   AST 20   ALT 63*   BILIDIR <0.2   BILITOT 0.3   ALKPHOS 101       CXR REVIEWED BY ME AND SHOWED:  CT CHEST W CONTRAST   Final Result   1. Interval development of multiple pulmonary nodules seen scattered throughout both lungs. There is also some nodular areas of patchy consolidation within the left lung and some interstitial infiltrate within the right upper lobe periphery. Findings are    nonspecific but favor an inflammatory etiology such as an atypical pneumonia or bronchiolitis. Interval development of pulmonary metastatic disease is considered less likely.   2. Moderate emphysema.   3. Left axillary lymphadenopathy is mildly decreased in comparison the prior study.   4. Previously seen abnormal paraspinal soft tissue identified along the lower thoracic spine has decreased in size and only some small ill-defined nodular soft tissue remains. Findings are consistent with neoplastic disease which is responding to    therapy.   5. Stable osseous metastatic disease.      XR CHEST PORTABLE   Final Result      No acute pulmonary pathology or change from the prior study                  CTA PULMONARY W CONTRAST   Final Result   1.  No pulmonary embolism.   2.  Patchy groundglass opacities and tree-in-bud are seen throughout the    lungs which are new  compared to prior examination.  This most likely    represents atypical infection/inflammation.  Clinical correlation is    recommended.   3.  Paraseptal and centrilobular emphysematous change of the lungs.   4.  Enlarged left axillary lymph nodes are not significantly changed    compared to PET/CT on 01/10/2018.  This is worrisome for nodal metastasis.   5.  Scattered lucent lesions are seen throughout the ribs, spine, and    scapula.  Findings may represent multiple myeloma or osseous metastasis.          XR CHEST STANDARD (2 VW)   Final Result      No acute cardiopulmonary findings.           ASSESSMENT/PLAN:  This is a 62 y.o. male with Fever, pulmonary nodules and multiple myeloma    Patient no longer on oxygen but his lung exam is still markedly abnormal and he appears short of breath.    Appropriately on steroids, meropenem and antifungals for positive galactomannan and fungitel.    Slow to respond unfortunately so we discussed potential need for a bronchoscopy.  However, with his immune system and a possible fungal pneumonia he will be slow to respond so utility of bronchoscopy is suspect at this time.    Reviewed images with family today.  Complaining of secretions he can't quite expectorate.  I'm leery of adding hypertonic because of his bronchospasm, so I added mucinex.  Continue chest vest.      Neville Route, MD

## 2018-01-30 NOTE — Progress Notes (Signed)
[facility] Progress Note    01/30/2018     David Watkins    MRN: [id number]    DOB: 01-03-1956    SUBJECTIVE:  He feels better. His previously noted sob cont to improve. Persistent productive cough    ECOG PS:  (2) Ambulatory and capable of self care, unable to carry out work activity, up and about > 50% or waking hours    Isolation: None    Medications    Scheduled Meds:  ??? amiodarone  200 mg Oral Daily   ??? diltiazem  240 mg Oral Daily   ??? furosemide  40 mg Intravenous Daily   ??? celecoxib  100 mg Oral BID   ??? methylPREDNISolone  40 mg Intravenous Q12H   ??? insulin lispro  0-18 Units Subcutaneous TID WC   ??? insulin lispro  0-9 Units Subcutaneous Nightly   ??? anidulafungin  100 mg Intravenous Q24H   ??? metoprolol tartrate  25 mg Oral BID   ??? atorvastatin  10 mg Oral Nightly   ??? calcium elemental  500 mg Oral BID   ??? fentaNYL  1 patch Transdermal Q72H   ??? gabapentin  300 mg Oral TID   ??? pantoprazole  40 mg Oral QAM AC   ??? sennosides-docusate sodium  2 tablet Oral Daily   ??? sertraline  50 mg Oral Daily   ??? valACYclovir  500 mg Oral BID   ??? meropenem  1 g Intravenous Q8H   ??? sodium chloride flush  10 mL Intravenous 2 times per day   ??? Saline Mouthwash  15 mL Swish & Spit 4x Daily AC & HS   ??? tiotropium  18 mcg Inhalation Daily   ??? budesonide  0.25 mg Nebulization BID    And   ??? formoterol  20 mcg Nebulization BID   ??? levalbuterol  1.25 mg Nebulization Q4H WA     Continuous Infusions:  ??? dextrose     ??? sodium chloride 50 mL/hr at 01/29/18 2032     PRN Meds:.acetaminophen, diphenhydrAMINE, potassium phosphate IVPB, morphine **OR** morphine, glucose, dextrose, glucagon (rDNA), dextrose, potassium chloride, magnesium sulfate, LORazepam, albuterol, bisacodyl, ondansetron, oxyCODONE-acetaminophen, prochlorperazine, sodium chloride flush, potassium chloride, magnesium sulfate, magnesium hydroxide, Saline Mouthwash, alteplase, LORazepam    ROS:  As noted above, otherwise remainder of 10-point ROS negative    Physical Exam:     I&O:       Intake/Output Summary (Last 24 hours) at 01/30/2018 1217  Last data filed at 01/30/2018 1121  Gross per 24 hour   Intake 2539 ml   Output 2525 ml   Net 14 ml       Vital Signs:  BP 102/61    Pulse 76    Temp 97.7 ??F (36.5 ??C) (Oral)    Resp 15    Ht 5\' 9"  (1.753 m)    Wt 192 lb (87.1 kg)    SpO2 96%    BMI 28.35 kg/m??     Weight:    Wt Readings from Last 3 Encounters:   01/30/18 192 lb (87.1 kg)   01/18/18 194 lb 9.6 oz (88.3 kg)   01/10/18 194 lb 12.8 oz (88.4 kg)         ??  General: Awake, alert and oriented.  HEENT:??normocephalic, alopecia,??PERRL, no scleral erythema or icterus, Oral mucosa moist and intact  LYMPH: ??Left axillary lymphadenopathy??  NECK: supple without palpable adenopathy  BACK: Straight negative CVAT  SKIN:??warm dry and intact without lesions rashes or masses  CHEST:??rhonchi w/ insp/exp wheeze,??without use of accessory muscles  CV: Normal S1 S2, irregular, no MRG  ABD:??NT ND normoactive BS, no palpable masses or hepatosplenomegaly  EXTREMITIES:??without edema, denies calf tenderness  NEURO: CN II - XII grossly intact  CATHETER:??Right IJ PAC (06/21/17, [facility])??- CDI        Data    CBC:   Recent Labs     01/28/18  0330 01/29/18  0430 01/30/18  0330   WBC 0.6* 1.3* 2.2*   HGB 8.1* 8.4* 8.7*   HCT 24.0* 25.0* 25.5*   MCV 93.6 93.6 92.7   PLT 25* 22* 18*     BMP/Mag:  Recent Labs     01/28/18  0330 01/29/18  0430 01/30/18  0330   NA 137 138 137   K 4.5 4.6 4.6   CL 102 102 100   CO2 26 27 27    PHOS 1.6* 2.0* 2.5   BUN 26* 25* 29*   CREATININE 0.6* <0.5* <0.5*   MG 2.00 1.90 2.00     LIVP:   Recent Labs     01/28/18  0330   AST 20   ALT 63*   BILIDIR <0.2   BILITOT 0.3   ALKPHOS 101     Coags:   No results for input(s): PROTIME, INR, APTT in the last 72 hours.  Uric Acid   Recent Labs     01/28/18  0330   LABURIC 2.1*       Diagnostics:  1. ??PET scan (01/10/18):    ??    2.  CTA Chest (525/19):  1. ??No pulmonary embolism.  2. ??Patchy groundglass opacities and tree-in-bud are seen throughout the    lungs which are new compared to prior examination. ??This most likely   represents atypical infection/inflammation. ??Clinical correlation is   recommended.  3. ??Paraseptal and centrilobular emphysematous change of the lungs.  4. ??Enlarged left axillary lymph nodes are not significantly changed   compared to PET/CT on 01/10/2018. ??This is worrisome for nodal metastasis.  5. ??Scattered lucent lesions are seen throughout the ribs, spine, and   scapula. ??Findings may represent multiple myeloma or osseous metastasis.    3.  Echocardiogram (01/23/18):  ??Technically difficult examination.  ??Normal left ventricle size, wall thickness, and systolic function with an  ??estimated ejection fraction of 50-55%. No obvious regional wall motion  ??abnormalities are seen (inferior and inferoseptal walls do appear normal, as  ??clinically questioned). Diastolic filling parameters suggests normal  ??diastolic function.  ??Aortic valve appears sclerotic but opens adequately.  ??Possible small/organized pericardial effusion though fat pad cannot be ruled  ??out.    Myeloma Labs (01/03/18):  SPEP:????reveals that M2, previously characterized as monoclonal IgG kappa in  mid-to-slow gamma is 1.0 gm/dL, greater than the 0.2 gm/dL observed on 08 Nov 9516.??  SIFE:????Serum immunofixation electrophoresis reveals persistent M2 in mid-to-slow gamma, a monoclonal IgG kappa. ??A recent M-spike, M1, was minor monoclonal IgG lambda in mid-gamma; M1 continues to be absent.  SFLC:  Kappa:  30.80, previously (11/08/17) -??10.40  Lambda:  1.46, previously (11/08/17) -??5.97  Free Kappa Lambda Ratio:  21.10, previously (11/08/17) -??1.74  Quantitative Immunoglobulins:  IgG:??  1480, previously (11/08/17) - 552  IgA:??  <26, previously (11/08/17) -??30  IgM:??  <20  ??  PROBLEM LIST: ??????????   ????  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 ??COPD   9.????Influenza A??(10/12/17)  10.  COPD Exacerbation (12/2017)  11.  Pericarditis (12/2017)  12.  A. Fib w/ RVR (12/2017)  ????  TREATMENT: ??????????   ??  1. Rad Tx to T5-7, T10-L3, Right Scapula - 3000 cGy - Dr. Bonita Quin 03/20/16-04/01/16  2. RVD x1 03/20/16 - discontinued d/t rash   3. Velcade/Pomalyst/Dex x3 cycles 04/23/16-06/17/16 - discontinued d/t reaction to pomalyst  4. Velcade/Dex x 1 cycle 06/26/16 (last dose of dexamethasone 07/22/16  5. High-dose melphalan followed by administration of PBSCs 2.36 x10^6 cd34cells/kg on 08/28/16  6. Maintenance Revlimid 10mg  daily (12/2016-04/23/17)  7. Dexamethasone 40mg  daily (04/24/17)  8. DCEP   Cycle #1 - 04/26/17  Cycle #2 - 05/25/17 - excellent response  9.??Dara/Velcade/Dex (C1D1 - 06/22/17) - BMBx ??10/18/17 - CR  Cycles 7-8 (21 day cycle)  - Dex 20 mg po D4,5.8,9,11 and 12  - Dara 16 mg/Kg D1 only with Dex 20 mg IV  - Velcade 1.3 mg/M2 D1,4,8 and 11  Cycle 9 and beyond (maintenance, 11/08/17??- 01/03/18)  Dara 16 mg/Kg Q 28 days  Dex 12 mg IV D1  Velcade Q 2 weeks??  10. DCEP   Cycle #1 - 01/13/18  ??  ASSESSMENT AND PLAN:??????????   ??  1. ??IgG kappa multiple myeloma / Plasma Cell Leukemia:??Relapsed disease??  - S/p??treatment??Dara/Velcade/Dex??x 8 cycles??(06/22/17 - 10/2017). Followed by??maintenance Dara??Q4 wks/Velcade??Q2ks/Dex??(started 11/08/17)  - Now w/ progressive disease based on myeloma labs (01/03/18) & PET scan (01/10/18), see above??  ??  PLAN:??DCEP for disease and pain control. Hope for allogeneic transplant in CR2, but needs to disease response and improvement in lung function??  ??  DCEP Cycle #1, Day + 18    Myeloma labs 5/31 - M spike 0.5  ??  2. ??ID: Afebrile w/ possibly multifocal PNA, unknown organism and pericarditis. He is high risk for fungal PNA d/t long term steroids   - Fungitell and galactomannan (01/25/18) - Pending   - Cont Valtrex ppx  - Cont Meropenem Day + 9  - Eraxis Day + 6    3.  Heme:??Chemotherapy induced pancytopenia   - Transfuse for Hgb < 7 and Platelets <??10K  - No transfusion today  - S/p Neulasta (01/19/18)  ??  4.??Metabolic: hypervolemia + fair uop + steroid-induced hyperglycemia and hypoPhos  - Cont high regimen Lispro SSI, AC&HS  - Cont Lasix 40 mg IV daily (started 01/25/18)   - Cont IVF:  NS @ 50 mL/hr  - Keep Mg > 2 and K+ > 4.0 and Phos > 2.0    5. Pulmonary:??Acute respiratory distress with mild hypercapnia and hypoxemia from COPD exacerbation, may also have PNA, unknown organism.  This developed after tapering off of steroids (01/20/18) for previous COPD exacerbation.   - Pulm Nodule: Stable??on??CT chest from??04/23/17??&??10/18/17 &??11/26/17; cont to monitor.   - CTPA??(11/26/17): No PE;??resolution of??mild upper lobe tree-in-bud opacities; Mild emphysema. Two 3 mm LLL pulmonary nodules, stable  - PFT (12/27/17):????compared to??07/2016:??FVC 3.25 L, 66%, and FEV1  1.42 L, 38%. FEV1/FVC is 44%. This is unchanged from prior study. Air trapping seen on lung volumes. Diffusion capacity 3.91, 79% of predicted, down from 5.21??  - CTPA (01/22/18):  No PE, patchy groundglass opacities and tree-in-bud are seen throughout the  lungs which are new compared to prior examination.   - Pulm following, appreciate recs. Cont regular f/u w/ Dr. Karie Kirks on d/c  - Cont home inhalers   - Cont Xopenox q4hrs  - Cont Steroid taper: Solumedrol  80 mg TID (started 01/22/18), 40 mg TID (01/25/18), pulm tapering    ??  6. GI/Nutrition: Appetite and oral intake is good??  - Cont PPI ppx w/ steroids??  - Cont low microbial diet ??  Constipation: ??Improved  - Cont Dulcolax prn & Senokot-s 2 tabs daily    ??  7. Cardiac:??Pericarditis, A fib with RVR  - H/o HLD &??has septal wall defect on echocardiogram.  - Echo (01/23/18):  LVEF 50-55# w/ normal diastolic fxn & no obvious regional wall motion abnormalities  - Cardiology following, appreciate recs  - Cont Lipitor??  - Cont Amiodarone & Lopressor 25 mg bid  - Cont Dilt gtt  - Cont  Solumedrol COPD exacerbation & pericarditis; as tapered and if counts improve, consider colchicine d/t high relapse of pericarditis when steroids tapered  - Start Celebrex (started 01/26/18)  ??  8. Psych:  Ongoing concerns about disease progression and ability to get transplant   Anxiety/Depression: Ongoing  - Cont Zoloft??50 mg daily??  - Psych to follow   Insomnia:??No complaints??currently??  ??  9. Peripheral Neuropathy:??Ongoing  - Cont Gabapentin 300 mg??TID  ??  10. 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  11.??Acute on??Chronic left lower back pain d/t Neoplasm:????Improved w/ chemotherapy, but now w/ lateral rib pain/pleuritic pain  - S/p Radiation (09/01/17 - 09/15/17,??Fried)  - Cont Fentanyl patch??50 mcg/hr (increased 01/13/18, Rx 01/18/18)  - Cont Percocet 7.5/325 mg q4hrs prn    - DVT Prophylaxis: Platelets <50,000 cells/dL - prophylactic lovenox on hold and mechanical prophylaxis with bilateral SCDs while in bed in place.  Contraindications to pharmacologic prophylaxis: Thrombocytopenia  Contraindications to mechanical prophylaxis: None    - Disposition: Once breathing and chest pain improves     Lisette Grinder, MD

## 2018-01-30 NOTE — Plan of Care (Signed)
Problem: Falls - Risk of:  Goal: Will remain free from falls  Description  Will remain free from falls  Outcome: Ongoing  Note:   Pt is a high fall risk. See Lattie Corns Fall Score and ABCDS Injury Risk assessments.   Explained fall risk precautions to pt and family and rationale behind their use to keep the patient safe. Pt bed is in low position, side rails up, call light and belongings are in reach. Fall wristband applied and present on pts wrist.  Bed alarm on.  Pt encouraged to call for assistance. Will continue with hourly rounds for PO intake, pain needs, toileting and repositioning as needed.        Problem: Bleeding:  Goal: Will show no signs and symptoms of excessive bleeding  Description  Will show no signs and symptoms of excessive bleeding  Outcome: Ongoing  Note:   Pt without s/s of active bleeding this shift.     Problem: PROTECTIVE PRECAUTIONS  Goal: Patient will remain free of nosocomial Infections  Outcome: Ongoing  Note:   Pt remains in protective precautions.  Pt educated on wearing mask when in hallways. Pt, staff, and visitors adhering to handwashing guidelines. Pt educated to shower or bathe daily with chlorhexidine and linens changed daily per protocol. Pt verbalizes understanding of low microbial diet. Will continue to monitor.

## 2018-01-30 NOTE — Progress Notes (Signed)
[facility]  Cardiology Inpatient Consult Service  Daily Progress Note        Admit Date:  01/21/2018    Referring Physician: Harlene Salts, MD    Reason for Consultation/Chief Complaint:   Pericarditis/AF/HLD    Subjective:   Interval history:  Remains in NSR    Medications:  ??? amiodarone  200 mg Oral Daily   ??? diltiazem  240 mg Oral Daily   ??? furosemide  40 mg Intravenous Daily   ??? celecoxib  100 mg Oral BID   ??? methylPREDNISolone  40 mg Intravenous Q12H   ??? insulin lispro  0-18 Units Subcutaneous TID WC   ??? insulin lispro  0-9 Units Subcutaneous Nightly   ??? anidulafungin  100 mg Intravenous Q24H   ??? metoprolol tartrate  25 mg Oral BID   ??? atorvastatin  10 mg Oral Nightly   ??? calcium elemental  500 mg Oral BID   ??? fentaNYL  1 patch Transdermal Q72H   ??? gabapentin  300 mg Oral TID   ??? pantoprazole  40 mg Oral QAM AC   ??? sennosides-docusate sodium  2 tablet Oral Daily   ??? sertraline  50 mg Oral Daily   ??? valACYclovir  500 mg Oral BID   ??? meropenem  1 g Intravenous Q8H   ??? sodium chloride flush  10 mL Intravenous 2 times per day   ??? Saline Mouthwash  15 mL Swish & Spit 4x Daily AC & HS   ??? tiotropium  18 mcg Inhalation Daily   ??? budesonide  0.25 mg Nebulization BID    And   ??? formoterol  20 mcg Nebulization BID   ??? levalbuterol  1.25 mg Nebulization Q4H WA       IV drips:  ??? dextrose     ??? sodium chloride 50 mL/hr at 01/29/18 2032       PRN:  acetaminophen, diphenhydrAMINE, potassium phosphate IVPB, morphine **OR** morphine, glucose, dextrose, glucagon (rDNA), dextrose, potassium chloride, magnesium sulfate, LORazepam, albuterol, bisacodyl, ondansetron, oxyCODONE-acetaminophen, prochlorperazine, sodium chloride flush, potassium chloride, magnesium sulfate, magnesium hydroxide, Saline Mouthwash, alteplase, LORazepam      Objective:     Vitals:    01/29/18 2300 01/30/18 0325 01/30/18 0828 01/30/18 0853   BP: 103/61 115/88  112/74   Pulse: 74 76  71   Resp: 14 12  14    Temp: 98 ??F (36.7  ??C) 97.8 ??F (36.6 ??C)  97.9 ??F (36.6 ??C)   TempSrc: Oral Oral  Oral   SpO2: 98% 95% 97% 97%   Weight:    192 lb (87.1 kg)   Height:           Intake/Output Summary (Last 24 hours) at 01/30/2018 1019  Last data filed at 01/30/2018 0853  Gross per 24 hour   Intake 2539 ml   Output 2725 ml   Net -186 ml     I/O last 3 completed shifts:  In: 2539 [P.O.:360; I.V.:1899; IV Piggyback:280]  Out: 1610 [Urine:3825]  Wt Readings from Last 3 Encounters:   01/30/18 192 lb (87.1 kg)   01/18/18 194 lb 9.6 oz (88.3 kg)   01/10/18 194 lb 12.8 oz (88.4 kg)       Admit Wt: Weight: 194 lb 10.7 oz (88.3 kg)   Todays Wt: Weight: 192 lb (87.1 kg)    TELEMETRY: Sinus     Physical Exam:     General:  Awake, alert, NAD  Skin:  Warm  and dry  Neck:  -JVP  Chest:  Clear to auscultation, respiration normal  Cardiovascular:  RRR S1S2  Abdomen:  Soft -  Extremities:  - edema      Objective    Labs:   Recent Labs     01/28/18  0330 01/29/18  0430 01/30/18  0330   NA 137 138 137   K 4.5 4.6 4.6   BUN 26* 25* 29*   CREATININE 0.6* <0.5* <0.5*   CL 102 102 100   CO2 26 27 27    GLUCOSE 237* 142* 240*   CALCIUM 8.1* 8.6 8.6   MG 2.00 1.90 2.00     Recent Labs     01/28/18  0330 01/29/18  0430 01/30/18  0330   WBC 0.6* 1.3* 2.2*   HGB 8.1* 8.4* 8.7*   HCT 24.0* 25.0* 25.5*   PLT 25* 22* 18*   MCV 93.6 93.6 92.7     No results for input(s): CHOLTOT, TRIG, HDL in the last 72 hours.    Invalid input(s): LIPIDCOMM, CHOLHDL, VLDCHOL, LDL  No results for input(s): PTT, INR in the last 72 hours.    Invalid input(s): PT  No results for input(s): CKTOTAL, CKMB, CKMBINDEX, TROPONINI in the last 72 hours.  No results for input(s): BNP in the last 72 hours.  No results for input(s): NTPROBNP in the last 72 hours.  No results for input(s): TSH in the last 72 hours.    Imaging:   I personally reviewed imaging studies including CXR, Stress test, TTE/TEE.      Assessment & Plan:     Pericarditis/AF/HLD  - Continue amiodarone, down to 200mg  qd today  - continue metoprolol  and diltiazem  - not OAC 2/2 low blood count  - feels better    Thank you for allowing to [place] to participate in the care of Donald Siva. Please call our service with questions.    All questions and concerns were addressed to the patient/family. Alternatives to my treatment were discussed. The note was completed using EMR. Every effort was made to ensure accuracy; however, inadvertent computerized transcription errors may be present.    Hilda Rynders R. Eugenio Hoes, MD, [facility], [facility]    01/30/2018 10:19 AM

## 2018-01-31 LAB — URINALYSIS
Bilirubin Urine: NEGATIVE
Glucose, Ur: 250 mg/dL — AB
Ketones, Urine: NEGATIVE mg/dL
Leukocyte Esterase, Urine: NEGATIVE
Nitrite, Urine: NEGATIVE
Protein, UA: 100 mg/dL — AB
Specific Gravity, UA: 1.02 (ref 1.005–1.030)
Urobilinogen, Urine: 0.2 E.U./dL (ref <2.0)
pH, UA: 6.5 (ref 5.0–8.0)

## 2018-01-31 LAB — EKG 12-LEAD
Atrial Rate: 66 {beats}/min
P Axis: 76 degrees
P-R Interval: 164 ms
Q-T Interval: 410 ms
QRS Duration: 90 ms
QTc Calculation (Bazett): 429 ms
R Axis: 73 degrees
T Axis: 75 degrees
Ventricular Rate: 66 {beats}/min

## 2018-01-31 LAB — BASIC METABOLIC PANEL
Anion Gap: 9 (ref 3–16)
BUN: 29 mg/dL — ABNORMAL HIGH (ref 7–20)
CO2: 28 mmol/L (ref 21–32)
Calcium: 8.3 mg/dL (ref 8.3–10.6)
Chloride: 96 mmol/L — ABNORMAL LOW (ref 99–110)
Creatinine: <0.5 mg/dL — ABNORMAL LOW (ref 0.9–1.3)
GFR African American: >60 (ref >60)
GFR Non-African American: >60 (ref >60)
Glucose: 181 mg/dL — ABNORMAL HIGH (ref 70–99)
Potassium: 4.3 mmol/L (ref 3.5–5.1)
Sodium: 133 mmol/L — ABNORMAL LOW (ref 136–145)

## 2018-01-31 LAB — POCT GLUCOSE
POC Glucose: 166 mg/dl — ABNORMAL HIGH (ref 70–99)
POC Glucose: 138 mg/dl — ABNORMAL HIGH (ref 70–99)
POC Glucose: 243 mg/dl — ABNORMAL HIGH (ref 70–99)
POC Glucose: 196 mg/dl — ABNORMAL HIGH (ref 70–99)

## 2018-01-31 LAB — CBC WITH AUTO DIFFERENTIAL
Basophils %: 0.2 %
Basophils Absolute: 0 10*3/uL (ref 0.0–0.2)
Eosinophils %: 0 %
Eosinophils Absolute: 0 10*3/uL (ref 0.0–0.6)
Hematocrit: 27.2 % — ABNORMAL LOW (ref 40.5–52.5)
Hemoglobin: 9.2 g/dL — ABNORMAL LOW (ref 13.5–17.5)
Lymphocytes %: 2.6 %
Lymphocytes Absolute: 0.1 10*3/uL — ABNORMAL LOW (ref 1.0–5.1)
MCH: 31.5 pg (ref 26.0–34.0)
MCHC: 33.8 g/dL (ref 31.0–36.0)
MCV: 93.2 fL (ref 80.0–100.0)
MPV: 9.2 fL (ref 5.0–10.5)
Monocytes %: 6.5 %
Monocytes Absolute: 0.2 10*3/uL (ref 0.0–1.3)
Neutrophils %: 90.7 %
Neutrophils Absolute: 3 10*3/uL (ref 1.7–7.7)
Platelets: 16 10*3/uL — CL (ref 135–450)
RBC: 2.92 M/uL — ABNORMAL LOW (ref 4.20–5.90)
RDW: 18.5 % — ABNORMAL HIGH (ref 12.4–15.4)
WBC: 3.3 10*3/uL — ABNORMAL LOW (ref 4.0–11.0)

## 2018-01-31 LAB — PROTIME-INR
INR: 1.12 (ref 0.86–1.14)
Protime: 12.8 s (ref 9.8–13.0)

## 2018-01-31 LAB — URIC ACID: Uric Acid, Serum: 1.9 mg/dL — ABNORMAL LOW (ref 3.5–7.2)

## 2018-01-31 LAB — MICROSCOPIC URINALYSIS

## 2018-01-31 LAB — HEPATIC FUNCTION PANEL
ALT: 59 U/L — ABNORMAL HIGH (ref 10–40)
AST: 25 U/L (ref 15–37)
Albumin: 2.9 g/dL — ABNORMAL LOW (ref 3.4–5.0)
Alkaline Phosphatase: 101 U/L (ref 40–129)
Bilirubin, Direct: <0.2 mg/dL (ref 0.0–0.3)
Total Bilirubin: 0.3 mg/dL (ref 0.0–1.0)
Total Protein: 5.5 g/dL — ABNORMAL LOW (ref 6.4–8.2)

## 2018-01-31 LAB — LACTATE DEHYDROGENASE: LD: 178 U/L (ref 100–190)

## 2018-01-31 LAB — PHOSPHORUS: Phosphorus: 2.2 mg/dL — ABNORMAL LOW (ref 2.5–4.9)

## 2018-01-31 LAB — MAGNESIUM: Magnesium: 1.8 mg/dL (ref 1.80–2.40)

## 2018-01-31 LAB — APTT: aPTT: 24.3 s — ABNORMAL LOW (ref 26.0–36.0)

## 2018-01-31 MED ORDER — AMIODARONE HCL 200 MG PO TABS
200 MG | Freq: Every day | ORAL | Status: DC
Start: 2018-01-31 — End: 2018-02-08
  Administered 2018-01-31 – 2018-02-08 (×9): 200 mg via ORAL

## 2018-01-31 MED ORDER — DEXTROSE 5 % IV SOLN
5 % | Freq: Once | INTRAVENOUS | Status: AC
Start: 2018-01-31 — End: 2018-01-31
  Administered 2018-01-31: 08:00:00 150 mg via INTRAVENOUS

## 2018-01-31 MED ORDER — DEXTROSE 5 % IV SOLN
5 % | INTRAVENOUS | Status: DC
Start: 2018-01-31 — End: 2018-01-31
  Administered 2018-01-31 (×2): 1 mg/min via INTRAVENOUS

## 2018-01-31 MED FILL — GABAPENTIN 300 MG PO CAPS: 300 mg | ORAL | Qty: 1

## 2018-01-31 MED FILL — MUCINEX 600 MG PO TB12: 600 mg | ORAL | Qty: 1

## 2018-01-31 MED FILL — OYSTER SHELL CALCIUM 500 MG PO TABS: 500 mg | ORAL | Qty: 1

## 2018-01-31 MED FILL — BUDESONIDE 0.25 MG/2ML IN SUSP: 0.25 MG/2ML | RESPIRATORY_TRACT | Qty: 2

## 2018-01-31 MED FILL — CELEBREX 100 MG PO CAPS: 100 mg | ORAL | Qty: 1

## 2018-01-31 MED FILL — DILTIAZEM HCL ER COATED BEADS 240 MG PO CP24: 240 mg | ORAL | Qty: 1

## 2018-01-31 MED FILL — AMIODARONE HCL 450 MG/9ML IV SOLN: 450 MG/9ML | INTRAVENOUS | Qty: 9

## 2018-01-31 MED FILL — PERFOROMIST 20 MCG/2ML IN NEBU: 20 MCG/2ML | RESPIRATORY_TRACT | Qty: 2

## 2018-01-31 MED FILL — SODIUM CHLORIDE 0.9 % IV SOLN: 0.9 % | INTRAVENOUS | Qty: 1000

## 2018-01-31 MED FILL — MEROPENEM 1 G IV SOLR: 1 g | INTRAVENOUS | Qty: 1

## 2018-01-31 MED FILL — PANTOPRAZOLE SODIUM 40 MG PO TBEC: 40 mg | ORAL | Qty: 1

## 2018-01-31 MED FILL — LIPITOR 20 MG PO TABS: 20 mg | ORAL | Qty: 1

## 2018-01-31 MED FILL — LEVALBUTEROL HCL 1.25 MG/0.5ML IN NEBU: 1.25 MG/0.5ML | RESPIRATORY_TRACT | Qty: 1

## 2018-01-31 MED FILL — MAGNESIUM SULFATE 2 GM/50ML IV SOLN: 2 GM/50ML | INTRAVENOUS | Qty: 50

## 2018-01-31 MED FILL — SPIRIVA HANDIHALER 18 MCG IN CAPS: 18 ug | RESPIRATORY_TRACT | Qty: 5

## 2018-01-31 MED FILL — METHYLPREDNISOLONE SODIUM SUCC 40 MG IJ SOLR: 40 mg | INTRAMUSCULAR | Qty: 40

## 2018-01-31 MED FILL — AMIODARONE HCL 200 MG PO TABS: 200 mg | ORAL | Qty: 1

## 2018-01-31 MED FILL — ERAXIS 100 MG IV SOLR: 100 mg | INTRAVENOUS | Qty: 30

## 2018-01-31 MED FILL — MORPHINE SULFATE 4 MG/ML IJ SOLN: 4 mg/mL | INTRAMUSCULAR | Qty: 1

## 2018-01-31 MED FILL — AMIODARONE HCL 150 MG/3ML IV SOLN: 150 MG/3ML | INTRAVENOUS | Qty: 3

## 2018-01-31 MED FILL — SERTRALINE HCL 50 MG PO TABS: 50 mg | ORAL | Qty: 1

## 2018-01-31 MED FILL — ACETAMINOPHEN 325 MG PO TABS: 325 mg | ORAL | Qty: 2

## 2018-01-31 MED FILL — VALACYCLOVIR HCL 500 MG PO TABS: 500 mg | ORAL | Qty: 1

## 2018-01-31 MED FILL — OXYCODONE-ACETAMINOPHEN 7.5-325 MG PO TABS: 7.5-325 mg | ORAL | Qty: 1

## 2018-01-31 MED FILL — METOPROLOL TARTRATE 25 MG PO TABS: 25 mg | ORAL | Qty: 1

## 2018-01-31 MED FILL — SENNOSIDES-DOCUSATE SODIUM 8.6-50 MG PO TABS: 8.6-50 mg | ORAL | Qty: 2

## 2018-01-31 NOTE — Plan of Care (Signed)
Problem: Bleeding:  Goal: Will show no signs and symptoms of excessive bleeding  Description  Will show no signs and symptoms of excessive bleeding  01/31/2018 1418 by Scharlene Corn, RN  Note:   Patient's hemoglobin this AM:   Recent Labs     01/31/18  0400   HGB 9.2*     Patient's platelet count this AM:   Recent Labs     01/31/18  0400   PLT 16*    Thrombocytopenia Precautions in place.  Patient showing no signs or symptoms of active bleeding.  Transfusion not indicated at this time.  Patient verbalizes understanding of all instructions. Will continue to assess and implement POC. Call light within reach and hourly rounding in place.         Problem: Breathing Pattern - Ineffective:  Goal: Ability to achieve and maintain a regular respiratory rate will improve  Description  Ability to achieve and maintain a regular respiratory rate will improve  01/31/2018 1418 by Scharlene Corn, RN  Note:   Patient with wheezes and crackles, he denies shortness of breath or chest pain. He remains on RA and continues to receive breathing treatment. Will continue to monitor.     Problem: PROTECTIVE PRECAUTIONS  Goal: Patient will remain free of nosocomial Infections  01/31/2018 1418 by Scharlene Corn, RN  Note:   Pt remains in protective precautions. No living plants or fresh flowers in his room. Patient educated on wearing mask when in hallways. Patient, staff, and visitors adhering to handwashing guidelines. Patient cleansed with chlorhexidine wipes and linens changed daily per protocol. Pt verbalizes understanding of low microbial diet. Patient remains free of nosocomial infections.       Problem: Falls - Risk of:  Goal: Absence of physical injury  Description  Absence of physical injury  Note:   Pt is a High fall risk. See Lattie Corns Fall Score and ABCDS Injury Risk assessments. Explained fall risk precautions to pt and family and rationale behind their use to keep the patient safe. Pt bed is in low position, side rails up, call  light and belongings are in reach. Fall wristband applied and present on pts wrist.  Bed alarm on.  Pt encouraged to call for assistance. Will continue with hourly rounds for PO intake, pain needs, toileting and repositioning as needed.        Problem: Pain:  Goal: Control of chronic pain  Description  Control of chronic pain  Note:   Patient complained of pleuritic pain, and requested analgesic. Administered analgesic as per order. Upon re-assessment patient verbalized some relief.

## 2018-01-31 NOTE — Progress Notes (Signed)
Physical Therapy/Occupational Therapy Attempt    Pt requesting therapy evaluations be deferred until later 2/2 him getting little sleep over night 2/2 going in to A-Fib. Will re-attempt therapy evaluations at a later time.     Jonetta Speak, PT, DPT

## 2018-01-31 NOTE — Progress Notes (Signed)
[facility] Progress Note    01/31/2018     David Watkins    MRN: [id number]    DOB: 03/24/1956    SUBJECTIVE:  Feels better, but remains weak    ECOG PS:  (2) Ambulatory and capable of self care, unable to carry out work activity, up and about > 50% or waking hours    Isolation: None    Medications    Scheduled Meds:  ??? guaiFENesin  600 mg Oral BID   ??? diltiazem  240 mg Oral Daily   ??? furosemide  40 mg Intravenous Daily   ??? celecoxib  100 mg Oral BID   ??? methylPREDNISolone  40 mg Intravenous Q12H   ??? insulin lispro  0-18 Units Subcutaneous TID WC   ??? insulin lispro  0-9 Units Subcutaneous Nightly   ??? anidulafungin  100 mg Intravenous Q24H   ??? metoprolol tartrate  25 mg Oral BID   ??? atorvastatin  10 mg Oral Nightly   ??? calcium elemental  500 mg Oral BID   ??? fentaNYL  1 patch Transdermal Q72H   ??? gabapentin  300 mg Oral TID   ??? pantoprazole  40 mg Oral QAM AC   ??? sennosides-docusate sodium  2 tablet Oral Daily   ??? sertraline  50 mg Oral Daily   ??? valACYclovir  500 mg Oral BID   ??? meropenem  1 g Intravenous Q8H   ??? sodium chloride flush  10 mL Intravenous 2 times per day   ??? Saline Mouthwash  15 mL Swish & Spit 4x Daily AC & HS   ??? tiotropium  18 mcg Inhalation Daily   ??? budesonide  0.25 mg Nebulization BID    And   ??? formoterol  20 mcg Nebulization BID   ??? levalbuterol  1.25 mg Nebulization Q4H WA     Continuous Infusions:  ??? amiodarone 429m/250ml D5W infusion 1 mg/min (01/31/18 0616)   ??? dextrose     ??? sodium chloride 50 mL/hr at 01/31/18 0401     PRN Meds:.acetaminophen, diphenhydrAMINE, potassium phosphate IVPB, morphine **OR** morphine, glucose, dextrose, glucagon (rDNA), dextrose, potassium chloride, magnesium sulfate, LORazepam, albuterol, bisacodyl, ondansetron, oxyCODONE-acetaminophen, prochlorperazine, sodium chloride flush, potassium chloride, magnesium sulfate, magnesium hydroxide, Saline Mouthwash, alteplase, LORazepam    ROS:  As noted above, otherwise remainder of 10-point ROS negative    Physical Exam:      I&O:      Intake/Output Summary (Last 24 hours) at 01/31/2018 0812  Last data filed at 01/31/2018 0645  Gross per 24 hour   Intake 2320 ml   Output 3025 ml   Net -705 ml       Vital Signs:  BP 115/73    Pulse 114    Temp 97.9 ??F (36.6 ??C) (Oral)    Resp 13    Ht 5' 9"  (1.753 m)    Wt 192 lb (87.1 kg)    SpO2 96%    BMI 28.35 kg/m??     Weight:    Wt Readings from Last 3 Encounters:   01/30/18 192 lb (87.1 kg)   01/18/18 194 lb 9.6 oz (88.3 kg)   01/10/18 194 lb 12.8 oz (88.4 kg)         ??  General: Awake, alert and oriented.  HEENT:??normocephalic, alopecia,??PERRL, no scleral erythema or icterus, Oral mucosa moist and intact  LYMPH: ??Left axillary lymphadenopathy??  NECK: supple without palpable adenopathy  BACK: Straight negative CVAT  SKIN:??warm dry and intact without lesions rashes  or masses  CHEST:??rhonchi w/ insp/exp wheeze,??without use of accessory muscles  CV: Normal S1 S2, irregular, no MRG  ABD:??NT ND normoactive BS, no palpable masses or hepatosplenomegaly  EXTREMITIES:??without edema, denies calf tenderness  NEURO: CN II - XII grossly intact  CATHETER:??Right IJ PAC (06/21/17, [facility])??- CDI        Data    CBC:   Recent Labs     01/29/18  0430 01/30/18  0330 01/31/18  0400   WBC 1.3* 2.2* 3.3*   HGB 8.4* 8.7* 9.2*   HCT 25.0* 25.5* 27.2*   MCV 93.6 92.7 93.2   PLT 22* 18* 16*     BMP/Mag:  Recent Labs     01/29/18  0430 01/30/18  0330 01/31/18  0400   NA 138 137 133*   K 4.6 4.6 4.3   CL 102 100 96*   CO2 27 27 28    PHOS 2.0* 2.5 2.2*   BUN 25* 29* 29*   CREATININE <0.5* <0.5* <0.5*   MG 1.90 2.00 1.80     LIVP:   Recent Labs     01/31/18  0400   AST 25   ALT 59*   BILIDIR <0.2   BILITOT 0.3   ALKPHOS 101     Coags:   Recent Labs     01/31/18  0400   PROTIME 12.8   INR 1.12   APTT 24.3*     Uric Acid   Recent Labs     01/31/18  0400   LABURIC 1.9*       Diagnostics:  1. ??PET scan (01/10/18):    ??    2.  CTA Chest (525/19):  1. ??No pulmonary embolism.  2. ??Patchy groundglass opacities and tree-in-bud are seen  throughout the   lungs which are new compared to prior examination. ??This most likely   represents atypical infection/inflammation. ??Clinical correlation is   recommended.  3. ??Paraseptal and centrilobular emphysematous change of the lungs.  4. ??Enlarged left axillary lymph nodes are not significantly changed   compared to PET/CT on 01/10/2018. ??This is worrisome for nodal metastasis.  5. ??Scattered lucent lesions are seen throughout the ribs, spine, and   scapula. ??Findings may represent multiple myeloma or osseous metastasis.    3.  Echocardiogram (01/23/18):  ??Technically difficult examination.  ??Normal left ventricle size, wall thickness, and systolic function with an  ??estimated ejection fraction of 50-55%. No obvious regional wall motion  ??abnormalities are seen (inferior and inferoseptal walls do appear normal, as  ??clinically questioned). Diastolic filling parameters suggests normal  ??diastolic function.  ??Aortic valve appears sclerotic but opens adequately.  ??Possible small/organized pericardial effusion though fat pad cannot be ruled  ??out.    Myeloma Labs (01/03/18):  SPEP:????reveals that M2, previously characterized as monoclonal IgG kappa in  mid-to-slow gamma is 1.0 gm/dL, greater than the 0.2 gm/dL observed on 08 Nov 2017.??  SIFE:????Serum immunofixation electrophoresis reveals persistent M2 in mid-to-slow gamma, a monoclonal IgG kappa. ??A recent M-spike, M1, was minor monoclonal IgG lambda in mid-gamma; M1 continues to be absent.  SFLC:  Kappa:  30.80, previously (11/08/17) -??10.40  Lambda:  1.46, previously (11/08/17) -??5.97  Free Kappa Lambda Ratio:  21.10, previously (11/08/17) -??1.74  Quantitative Immunoglobulins:  IgG:??  1480, previously (11/08/17) - 552  IgA:??  <26, previously (11/08/17) -??30  IgM:??  <20    Myeloma Labs (01/27/18):  SPE/IFE:??M-protein persists in the gamma region on serum protein electrophoresis. The amount of this protein is 0.5 gm/dL  Quantitative  Immunoglobulins:  IgG:??  791  IgA:??  17  IgM:??  11  ??  PROBLEM LIST: ??????????   ????  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 ??COPD   9.????Influenza A??(10/12/17)  10.  COPD Exacerbation (12/2017)  11.  Pericarditis (12/2017)  12.  A. Fib w/ RVR (12/2017)  ????  TREATMENT: ??????????   ??  1. Rad Tx to T5-7, T10-L3, Right Scapula - 3000 cGy - Dr. Jamas Lav 03/20/16-04/01/16  2. RVD x1 03/20/16 - discontinued d/t rash   3. Velcade/Pomalyst/Dex x3 cycles 04/23/16-06/17/16 - discontinued d/t reaction to pomalyst  4. Velcade/Dex x 1 cycle 06/26/16 (last dose of dexamethasone 07/22/16  5. High-dose melphalan followed by administration of PBSCs 2.36 x10^6 cd34cells/kg on 08/28/16  6. Maintenance Revlimid 42m daily (12/2016-04/23/17)  7. Dexamethasone 432mdaily (04/24/17)  8. DCEP   Cycle #1 - 04/26/17  Cycle #2 - 05/25/17 - excellent response  9.??Dara/Velcade/Dex (C1D1 - 06/22/17) - BMBx ??10/18/17 - CR  Cycles 7-8 (21 day cycle)  - Dex 20 mg po D4,5.8,9,11 and 12  - Dara 16 mg/Kg D1 only with Dex 20 mg IV  - Velcade 1.3 mg/M2 D1,4,8 and 11  Cycle 9 and beyond (maintenance, 11/08/17??- 01/03/18)  Dara 16 mg/Kg Q 28 days  Dex 12 mg IV D1  Velcade Q 2 weeks??  10. DCEP   Cycle #1 - 01/13/18  ??  ASSESSMENT AND PLAN:??????????   ??  1. ??IgG kappa multiple myeloma / Plasma Cell Leukemia:??Relapsed disease??  - S/p??treatment??Dara/Velcade/Dex??x 8 cycles??(06/22/17 - 10/2017). Followed by??maintenance Dara??Q4 wks/Velcade??Q2ks/Dex??(started 11/08/17)  - Now w/ progressive disease based on myeloma labs (01/03/18) & PET scan (01/10/18), see above??  ??  PLAN:??DCEP for disease and pain control. Hope for allogeneic transplant in CR2, but needs to disease response and improvement in lung function??    - M-spike (01/27/18) - 0.5 g/dL, previously 1.0 g/dL  ??  DCEP Cycle #1,  Day + 19    ??  2. ??ID: Afebrile w/ possible bacterial and fungal multifocal PNA  - Fungitell and galactomannan (01/25/18) - Positive   - Cont Valtrex ppx  - Cont Meropenem Day + 10  - Eraxis Day + 7, consider changing to Cresemba    3. Heme:??Chemotherapy induced pancytopenia   - Transfuse for Hgb < 7 and Platelets <??10K  - No transfusion today  - S/p Neulasta (01/19/18)  ??  4.??Metabolic: Stable renal fxn and e-lytes except for steroid-induced hyperglycemia, hypoNa and hypoPhos  - Cont high regimen Lispro SSI, AC&HS  - S/p Lasix 40 mg IV daily (01/25/18 - 01/30/18)   - Decrease IVF:  NS @ 20 mL/hr  - Keep Mg > 2 and K+ > 4.0 and Phos > 2.0    5. Pulmonary:??Acute respiratory distress with mild hypercapnia and hypoxemia from COPD exacerbation and underlying PNA has resolved.  He also is being treated for bacterial and fungal PNA which was likely contributing to acute respiratory failure   - Pulm Nodule: Stable??on??CT chest from??04/23/17??&??10/18/17 &??11/26/17; cont to monitor.   - CTPA??(11/26/17): No PE;??resolution of??mild upper lobe tree-in-bud opacities; Mild emphysema. Two 3 mm LLL pulmonary nodules, stable  - PFT (12/27/17):????compared to??07/2016:??FVC 3.25 L, 66%, and FEV1  1.42 L, 38%. FEV1/FVC is 44%. This is unchanged from prior study. Air trapping seen on lung volumes. Diffusion capacity 3.91, 79% of predicted, down from  5.21??  - CTPA (01/22/18):  No PE, patchy groundglass opacities and tree-in-bud are seen throughout the  lungs which are new compared to prior examination.   - Pulm following, appreciate recs. Cont regular f/u w/ Dr. Dorann Lodge on d/c  - Cont home inhalers    - Cont Xopenox q4hrs  - Cont Steroid taper: Solumedrol 80 mg TID (started 01/22/18), 40 mg TID (01/25/18), 40 mg bid (01/26/18), pulm tapering    ??  6. GI/Nutrition: Appetite and oral intake is good??  - Cont PPI ppx w/ steroids??  - Cont low microbial diet ??  Constipation: ??Improved  - Cont Dulcolax prn & Senokot-s 2 tabs daily    ??  7. Cardiac:??Ongoing A fib  with RVR  - H/o HLD &??has septal wall defect on echocardiogram.  - Echo (01/23/18):  LVEF 50-55# w/ normal diastolic fxn & no obvious regional wall motion abnormalities  - Cardiology following, appreciate recs  - Cont Lipitor??  - S/p Dilt gtt (stopped 01/27/18), cont Diltiazem CD 240 mg daily & Lopressor 25 mg bid  - Cont IV Amiodarone  - Cont Solumedrol COPD exacerbation & pericarditis  - Cont Celebrex (started 01/26/18)  ??  8. Psych:  Ongoing concerns about disease progression and ability to get transplant   Anxiety/Depression: Ongoing  - Cont Zoloft??50 mg daily??  - Psych to follow   Insomnia:??No complaints??currently??  ??  9. Peripheral Neuropathy:??Ongoing  - Cont Gabapentin 300 mg??TID  ??  10. 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  11.??Acute on??Chronic left lower back pain d/t Neoplasm:????Improved w/ chemotherapy  - S/p Radiation (09/01/17 - 09/15/17,??Fried)  - Cont Fentanyl patch??50 mcg/hr (increased 01/13/18, Rx 01/18/18)  - Cont Percocet 7.5/325 mg q4hrs prn    - DVT Prophylaxis: Platelets <50,000 cells/dL - prophylactic lovenox on hold and mechanical prophylaxis with bilateral SCDs while in bed in place.  Contraindications to pharmacologic prophylaxis: Thrombocytopenia  Contraindications to mechanical prophylaxis: None    - Disposition: Once breathing and chest pain improves     Wayland Salinas, APRN - CNP       David Albert. Drucilla Schmidt, DO, MS  [facility]    Please contact via:  1.  Perfect Serve  2.  Cell Phone:  [phone]    01/31/2018   9:36 PM

## 2018-01-31 NOTE — Plan of Care (Signed)
Problem: Falls - Risk of:  Goal: Will remain free from falls  Description  Will remain free from falls  Outcome: Ongoing  Note:   Orthostatic vital signs obtained at start of shift - see flowsheet for details.  Pt is a high fall risk. See Lattie Corns Fall Score and ABCDS Injury Risk assessments.   Explained fall risk precautions to pt and family and rationale behind their use to keep the patient safe. Pt bed is in low position, side rails up, call light and belongings are in reach. Fall wristband applied and present on pts wrist. Wife at bedside, both agree to call RN before getting OOB.  Pt encouraged to call for assistance. Will continue with hourly rounds for PO intake, pain needs, toileting and repositioning as needed.        Problem: Pain:  Goal: Pain level will decrease  Description  Pain level will decrease  Outcome: Ongoing  Note:   Pt complains of acute pleuritic pain to the right side that is worse when coughing. Medicated with PRN morphine and pt verbalizes relief. Pain under control per pt at this time. Will continue to assess.      Problem: Nutrition  Goal: Optimal nutrition therapy  Outcome: Ongoing     Problem: Cardiac:  Goal: Ability to maintain vital signs within normal range will improve  Description  Ability to maintain vital signs within normal range will improve  Outcome: Ongoing  Note:   See progress note.      Problem: Bleeding:  Goal: Will show no signs and symptoms of excessive bleeding  Description  Will show no signs and symptoms of excessive bleeding  Outcome: Ongoing  Note:   Patient's hemoglobin this AM:   Recent Labs     01/31/18  0400   HGB 9.2*     Patient's platelet count this AM:   Recent Labs     01/31/18  0400   PLT 16*    Thrombocytopenia Precautions in place.  Patient showing no signs or symptoms of active bleeding.  Transfusion not indicated at this time.  Patient verbalizes understanding of all instructions. Will continue to assess and implement POC. Call light within reach and  hourly rounding in place.          Problem: Breathing Pattern - Ineffective:  Goal: Ability to achieve and maintain a regular respiratory rate will improve  Description  Ability to achieve and maintain a regular respiratory rate will improve  Outcome: Ongoing  Note:   Pt continues with strong cough and dyspnea with exertion. Wheezes noted to lungs bilaterally. O2 saturation remains WNL on room air. Utilizing suction for productive cough.      Problem: PROTECTIVE PRECAUTIONS  Goal: Patient will remain free of nosocomial Infections  Outcome: Ongoing

## 2018-01-31 NOTE — Progress Notes (Signed)
Physical Therapy    Facility/Department: [facility]  Initial Assessment/Treatment note    NAME: David Watkins  DOB: [birth year]  MRN: [id number]    Date of Service: 01/31/2018    Discharge Recommendations:[name] scored a 18/24 on the AM-PAC short mobility form. Current research shows that an AM-PAC score of 18 or greater is typically associated with a discharge to the patient's home setting. Based on the patient's AM-PAC score and their current functional mobility deficits, it is recommended that the patient have 2-3 sessions per week of Physical Therapy at d/c to increase the patient's independence.      HOME HEALTH CARE: LEVEL 2 SOCIAL     - Initial home health evaluation to occur within 24-48 hours, in patient home   - Therapy to evaluate with goal of regaining prior level of functioning   - Therapy to evaluate if patient has Home Health Aide needs for personal care   - Social Worker evaluation within 24-48 hours, includes evaluation of resources and insurance to determine AL/IL/LTC/Medicaid options   - Council on Aging Referral   - Family/POA Care Conference to discuss home support and care needs post discharge within two weeks of discharge.        PT Equipment Recommendations  Equipment Needed: Yes(Anticipate that he will need a RW upon d/c.)    Assessment   Body structures, Functions, Activity limitations: Decreased functional mobility ;Decreased endurance;Decreased strength;Decreased balance;Increased Pain  Assessment: Pt is a 62 yo male who is currently functioning below his baseline for functional mobility following a prolonged hospitalization where he has been minimally mobile. Pt demosntrating B LEs weakness and decreased endurance with all mobility necessitating the need of an RW and physical assistance for safe mobility. PT would benefit from continued therapy to increase his independence with functional mboility to allow for a safe return home.   Treatment Diagnosis: decreased  independence with functional mobility  Prognosis: Good  Decision Making: Low Complexity  Patient Education: Pt educated on PT POC, goals for rehab stay, and d/c recommendations. Pt verbalized understanding.  REQUIRES PT FOLLOW UP: Yes  Activity Tolerance  Activity Tolerance: Patient limited by fatigue;Patient limited by endurance;Patient limited by pain       Patient Diagnosis(es): The primary encounter diagnosis was Atypical pneumonia. Diagnoses of Neutropenia associated with infection (HCC) and Sepsis due to pneumonia [facility]) were also pertinent to this visit.     has a past medical history of Bone pain, Hyperlipidemia, Leukemia, plasma cell, in relapse (HCC), Low back pain, Multiple myeloma (HCC), and Neuropathy.   has a past surgical history that includes bone marrow biopsy; other surgical history (Left, 08/17/2016); bone marrow transplant; and pre-malignant / benign skin lesion excision (Left, 03/29/2017).    Restrictions  Position Activity Restriction  Other position/activity restrictions: Up as tolerated  Vision/Hearing  Vision: Impaired  Vision Exceptions: Wears glasses at all times  Hearing: Within functional limits     Subjective  General  Chart Reviewed: Yes  Additional Pertinent Hx: Pt is a 62 yo male who presented to the ER on with complaint of chest pain and shortness of breath. PMH: bone pain, HLD, leukemia, low back pain, multiple myeloma, neuropathy.   Referring Practitioner: Dr. Lynne Logan Commands: Within Functional Limits  General Comment  Comments: Pt was sitting in the bedside chair upon arrival. Pt agreeable to PT intervention  Subjective  Subjective: Pt states that he has only walked 5-6 times to/from the bathroom since he got  here 9 days ago. Pt stating that he is very nervous about how his breathing will be during  mobility.   Pain Screening  Patient Currently in Pain: Yes  Vital Signs  Patient Currently in Pain: Yes(6/10, R ribs and spine. States this is the typical multiple myeloma pain  he lives with. Denies need for intervention. )       Orientation  Orientation  Overall Orientation Status: Within Functional Limits  Social/Functional History  Social/Functional History  Lives With: Significant other(2 kids)  Type of Home: House  Home Layout: One level, Laundry in basement  Home Access: Stairs to enter with rails  Entrance Stairs - Number of Steps: 2  Bathroom Shower/Tub: Pension scheme manager: Midwife: Grab bars in shower, Hand-held shower  Home Equipment: Sports administrator  ADL Assistance: Independent  Homemaking Assistance: Independent(and yard work )  Optometrist: Independent  Transfer Assistance: Surveyor, minerals: Yes  Occupation: Full time employment  Type of occupation: Engineer, manufacturing  Leisure & Hobbies: Golf, fish, go for drives  Cognition        Objective          AROM RLE (degrees)  RLE AROM: WFL  AROM LLE (degrees)  LLE AROM : WFL  Strength RLE  Strength RLE: WFL  Strength LLE  Strength LLE: WFL     Sensation  Overall Sensation Status: (denies numbness or tingling)     Transfers  Sit to Stand: Contact guard assistance(From bed, recliner and commode. VC for hand placement when using RW)  Stand to sit: Contact guard assistance(VC for hand placement)  Ambulation  Ambulation?: Yes  More Ambulation?: Yes  Ambulation 1  Surface: level tile  Device: No Device  Assistance: Contact guard assistance  Quality of Gait: step through pattern, decreased step length and foot clearance B, fwd flexed posture. Reaching out with B UEs for increased support. No outward LOB.   Distance: 25'  Comments: VC for urpight psoture with fwd gaze, increased B step length and foot clearance and pursed lip breathing. Pt stating that he needed to promptly sit down 2/2 his legs feeling "shaky"  Ambulation 2  Surface - 2: level tile  Device 2: Rolling Walker  Assistance 2: Contact guard assistance  Quality of Gait 2: reciprocal pattern with decreased B step length and foot  clearance. Fwd flexed posture. Poor walker positioning. No LOB noted  Distance: 30', 150', 20'  Comments: VC for upright posture with fwd gaze, positioning in RW, increased B heel strike and step length, and pursed lip breathing. Pt did require multiple standing rest breaks during longer bout of ambulation.   Stairs/Curb  Stairs?: No     Balance  Sitting - Static: Good  Sitting - Dynamic: Good  Standing - Static: Good;-  Standing - Dynamic: Fair        Plan   Plan  Times per week: 2-5  Current Treatment Recommendations: Strengthening, Stair training, Investment banker, operational, Location manager, Teaching laboratory technician, Building services engineer, Teacher, early years/pre, Tour manager Devices  Type of devices: Left in chair, Nurse notified, Call light within reach(Rn stating that while pt's s.o. is present the alarm does not need to be one.)    G-Code       OutComes Score  AM-PAC Score             Goals  Short term goals  Time Frame for Short term goals: by d/c  Short term goal 1: Pt will transfer supine <> sit independently  Short term goal 2: Pt will transfer sit <> stand MOD I  Short term goal 3: Pt will ambulate 200' with RW and MOD I  Patient Goals   Patient goals : Be able to independently get up and walk around without feeling SOB.       Therapy Time   Individual Concurrent Group Co-treatment   Time In 1410         Time Out 1449         Minutes [address] PT    If pt d/c'd prior to next treatment, this note serves as a discharge note.

## 2018-01-31 NOTE — Progress Notes (Signed)
Occupational Therapy   Occupational Therapy Initial Assessment/treatment  Date: 01/31/2018   Patient Name: David Watkins  MRN: [id number]     DOB: 18-Dec-1955    Date of Service: 01/31/2018    Discharge Recommendations:    David Watkins scored a 20/24 on the AM-PAC ADL Inpatient form. Current research shows that an AM-PAC score of 18 or greater is typically associated with a discharge to the patient's home setting. Based on the patient's AM-PAC score and their current ADL deficits, it is recommended that the patient have 2-3 sessions per week of Occupational Therapy at d/c to increase the patient's independence.    OT Equipment Recommendations  Equipment Needed: Yes  Mobility Devices: ADL Assistive Devices  ADL Assistive Devices: Shower Chair with back;Toileting - Raised Toilet Seat with arms    Assessment   Performance deficits / Impairments: Decreased functional mobility ;Decreased ADL status;Decreased endurance  Assessment: Pt presents with a decline in functional independence.  Pt is from home and was independent prior to admission.  Pt plans to go home at discharge, but might not have 24 hr assist available.  Treatment Diagnosis: Impaired ADL, functional activity tolerance, and functional mobility  Prognosis: Good  Decision Making: Medium Complexity  Patient Education: Role of OT:  Pt verbalized understanding  REQUIRES OT FOLLOW UP: Yes  Activity Tolerance  Activity Tolerance: Patient Tolerated treatment well  Safety Devices  Safety Devices in place: Yes  Type of devices: Left in chair;Call light within reach;Nurse notified       Treatment Diagnosis: Impaired ADL, functional activity tolerance, and functional mobility      Restrictions  Position Activity Restriction  Other position/activity restrictions: Up as tolerated    Subjective   General  Chart Reviewed: Yes  Additional Pertinent Hx: Pt admitted 01/22/18 with shortness of breath.             PMH includes: Multiple myeloma, Leukemia, low back pain  Family /  Caregiver Present: Yes  Referring Practitioner: Dr. Baruch Merl  Diagnosis: Pneumonia  Subjective  Subjective: Pt up in chair upon entry.     Pt agreeable to activity.  Pain Assessment  Pain Level: 6  -  Nurse aware  Social/Functional History  Social/Functional History  Lives With: Significant other(2 kids)  Type of Home: House  Home Layout: One level, Laundry in basement  Home Access: Stairs to enter with rails  Entrance Stairs - Number of Steps: 2  Bathroom Shower/Tub: Pension scheme manager: Midwife: Grab bars in shower, Hand-held shower  Home Equipment: Sports administrator  ADL Assistance: Independent  Homemaking Assistance: Independent(and yard work )  Optometrist: Independent  Transfer Assistance: Surveyor, minerals: Yes  Occupation: Full time employment  Type of occupation: Engineer, manufacturing  Leisure & Hobbies: Golf, fish, go for drives       Objective   Vision: Impaired  Vision Exceptions: Wears glasses at all times  Hearing: Within functional limits    Orientation  Overall Orientation Status: Within Normal Limits     Balance  Sitting Balance: Independent  Standing Balance: Contact guard assistance(to SBA)  Standing Balance  Time: ~4 min  Activity: bathroom mobility/activity, walk in hall  Functional Mobility  Functional - Mobility Device: Rolling Walker  Activity: To/from bathroom  Assist Level: Manufacturing systems engineer - Technique: Ambulating  Equipment Used: Standard toilet(Grab bar)  Toilet Transfer: Contact guard assistance(increased effort)  ADL  Grooming: Stand by assistance(washing hands standing at  sink)  LE Dressing: Minimal assistance(for slippers)  Toileting: (denied need)  Tone RUE  RUE Tone: Normotonic  Tone LUE  LUE Tone: Normotonic  Coordination  Movements Are Fluid And Coordinated: Yes        Transfers  Stand Step Transfers: Contact guard assistance  Sit to stand: Contact guard assistance  Stand to sit: Contact guard assistance      Cognition  Overall Cognitive Status: WNL        Sensation  Overall Sensation Status: (denies numbness or tingling)        LUE AROM (degrees)  LUE AROM : WFL  RUE AROM (degrees)  RUE AROM : WFL  LUE Strength  Gross LUE Strength: WFL  RUE Strength  Gross RUE Strength: WFL     Hand Dominance  Hand Dominance: Right     Treatment included ADL and transfer training.        Plan   Plan  Times per week: 2-5  Times per day: Daily  Current Treatment Recommendations: Building services engineer, Endurance Training, Self-Care / ADL    If patient is discharged prior to next treatment, this note will serve as the discharge.    AM-PAC Score        AM-PAC Inpatient Daily Activity Raw Score: 20  AM-PAC Inpatient ADL T-Scale Score : 42.03  ADL Inpatient CMS 0-100% Score: 38.32  ADL Inpatient CMS G-Code Modifier : CJ    Goals                      No goals met  Short term goals  Time Frame for Short term goals: Discharge  Short term goal 1: Transfer to/from toilet with SBA  Short term goal 2: Stance with SBA x6 min while engaging in ADL/functional mobility  Short term goal 3: lower body dressing with CG and assistive devices as needed  Patient Goals   Patient goals : Go home.   Be able to get up and walk on his own.       Therapy Time   Individual Concurrent Group Co-treatment   Time In 1410         Time Out 1449         Minutes 39         Timed Code Treatment Minutes: 29 Minutes    Total Treatment Time:39    Derrill Kay, OTR/L [phone]

## 2018-01-31 NOTE — Progress Notes (Signed)
Pulmonary Followup Note    CC: COPD exacerbation, pneumonia, neutropenic fever and myeloma  Subjective:  Reports continued improvement.  However, still having cough and wheezing and appears dyspneic.  Says dyspnea is improving as he can walk even further without panting.  Had an episode of panting but it was associated with being in afib with RVR.  Continue having pain in his back which is chronic.    ROS:  Denies headache, nausea or chest pain.    24HR INTAKE/OUTPUT:      Intake/Output Summary (Last 24 hours) at 01/31/2018 1807  Last data filed at 01/31/2018 1642  Gross per 24 hour   Intake 2440 ml   Output 2400 ml   Net 40 ml       ??? amiodarone  200 mg Oral Daily   ??? guaiFENesin  600 mg Oral BID   ??? diltiazem  240 mg Oral Daily   ??? celecoxib  100 mg Oral BID   ??? methylPREDNISolone  40 mg Intravenous Q12H   ??? insulin lispro  0-18 Units Subcutaneous TID WC   ??? insulin lispro  0-9 Units Subcutaneous Nightly   ??? anidulafungin  100 mg Intravenous Q24H   ??? metoprolol tartrate  25 mg Oral BID   ??? atorvastatin  10 mg Oral Nightly   ??? calcium elemental  500 mg Oral BID   ??? fentaNYL  1 patch Transdermal Q72H   ??? gabapentin  300 mg Oral TID   ??? pantoprazole  40 mg Oral QAM AC   ??? sennosides-docusate sodium  2 tablet Oral Daily   ??? sertraline  50 mg Oral Daily   ??? valACYclovir  500 mg Oral BID   ??? meropenem  1 g Intravenous Q8H   ??? sodium chloride flush  10 mL Intravenous 2 times per day   ??? Saline Mouthwash  15 mL Swish & Spit 4x Daily AC & HS   ??? tiotropium  18 mcg Inhalation Daily   ??? budesonide  0.25 mg Nebulization BID    And   ??? formoterol  20 mcg Nebulization BID   ??? levalbuterol  1.25 mg Nebulization Q4H WA           PHYSICAL EXAMINATION:  BP 118/77    Pulse 62    Temp 97.8 ??F (36.6 ??C) (Oral)    Resp 14    Ht 5' 9" (1.753 m)    Wt 197 lb 9.6 oz (89.6 kg)    SpO2 98%    BMI 29.18 kg/m??   CURRENT PULSE OXIMETRY:  SpO2: 98 %  24HR PULSE OXIMETRY RANGE:  SpO2  Avg: 95.8 %  Min: 94 %   Max: 99 % on ra      Gen: mild distress. Speaking in full sentences with trace accessory muscle use  HEENT: PERRL, EOMI, OP nl  Lung: inspiratory ronchi and expiratory wheezing throughout  CV: RRR without M/R/R  Abd: +BS, soft, NT/ND  Ext: No edema.    DATA  CBC:   Recent Labs     01/29/18  0430 01/30/18  0330 01/31/18  0400   WBC 1.3* 2.2* 3.3*   HGB 8.4* 8.7* 9.2*   HCT 25.0* 25.5* 27.2*   MCV 93.6 92.7 93.2   PLT 22* 18* 16*     BMP:   Recent Labs     01/29/18  0430 01/30/18  0330 01/31/18  0400   NA 138 137 133*   K 4.6 4.6 4.3   CL 102 100  96*   CO2 _0 PHOS 2.0* 2.5 2.2*   BUN 25* 29* 29*   CREATININE <0.5* <0.5* <0.5*     No results for input(s): PHART, PCO2ART, PO2ART in the last 72 hours.  LIVER PROFILE:   Recent Labs     01/31/18  0400   AST 25   ALT 59*   BILIDIR <0.2   BILITOT 0.3   ALKPHOS 101       CXR REVIEWED BY ME AND SHOWED:  CT CHEST W CONTRAST   Final Result   1. Interval development of multiple pulmonary nodules seen scattered throughout both lungs. There is also some nodular areas of patchy consolidation within the left lung and some interstitial infiltrate within the right upper lobe periphery. Findings are    nonspecific but favor an inflammatory etiology such as an atypical pneumonia or bronchiolitis. Interval development of pulmonary metastatic disease is considered less likely.   2. Moderate emphysema.   3. Left axillary lymphadenopathy is mildly decreased in comparison the prior study.   4. Previously seen abnormal paraspinal soft tissue identified along the lower thoracic spine has decreased in size and only some small ill-defined nodular soft tissue remains. Findings are consistent with neoplastic disease which is responding to    therapy.   5. Stable osseous metastatic disease.      XR CHEST PORTABLE   Final Result      No acute pulmonary pathology or change from the prior study                  CTA PULMONARY W CONTRAST   Final Result   1.  No pulmonary embolism.   2.  Patchy  groundglass opacities and tree-in-bud are seen throughout the    lungs which are new compared to prior examination.  This most likely    represents atypical infection/inflammation.  Clinical correlation is    recommended.   3.  Paraseptal and centrilobular emphysematous change of the lungs.   4.  Enlarged left axillary lymph nodes are not significantly changed    compared to PET/CT on 01/10/2018.  This is worrisome for nodal metastasis.   5.  Scattered lucent lesions are seen throughout the ribs, spine, and    scapula.  Findings may represent multiple myeloma or osseous metastasis.          XR CHEST STANDARD (2 VW)   Final Result      No acute cardiopulmonary findings.           ASSESSMENT/PLAN:  This is a 62 y.o. male with Fever, pulmonary nodules and multiple myeloma    Patient no longer on oxygen but his lung exam is still markedly abnormal and he appears short of breath.    Appropriately on steroids, meropenem and antifungals for positive galactomannan and fungitel.      Says he's subjectively improving but exam wise he still seems short of breath and his lungs sound bad.  Possible fungal infection which will be slow to correct may be the cause.  Discussed bronchoscopy again which may be helpful to clear secretions and further identify what's going on.    Want to get a repeat image to see where best to lavage and if there's any progression for the better or for the worse in terms of his nodules.      Sedating him for a bronchoscopy may be a challenge.    Neville Route, MD

## 2018-01-31 NOTE — Plan of Care (Signed)
Increase independence w/ADL and functional transfers.

## 2018-01-31 NOTE — Progress Notes (Signed)
0300: Pt converted back to Afib from sinus rhythm with a HR between 100-110bpm. Dr. Shawnie Dapper notified ordered bolus Amiodarone IV.   0500: Pt rhythm did not respond, remains in Afib and HR increased to 115-125bpm. Dr. Shawnie Dapper notified again and ordered Amio gtt. BP remains stable. Pt anxious but asymptomatic. Will continue to monitor.

## 2018-01-31 NOTE — Progress Notes (Signed)
Went to a fib, got IV amio, now back in a fib.    PE:  Blood pressure 118/77, pulse 62, temperature 97.8 ??F (36.6 ??C), temperature source Oral, resp. rate 14, height 5' 9"  (1.753 m), weight 197 lb 9.6 oz (89.6 kg), SpO2 98 %.  General (appearance):  Alert and cooperative.   Eyes: Anicteric  Neck: Supple. No JVD  Ears/Nose/Mouth/Thorat: No cyanosis  CV:RRR No obvious m/r/g  Respiratory:  + mild end-expiratory wheezing  GI: Abd s/nt/nd. No peritoneal signs  Skin: Warm, dry. No rashes  Neuro/Psych: Alert and oriented x 3. Appropriate behavior  Ext:  No c/c. Mild edema  Pulses:  2+ radial    Lab Results   Component Value Date    WBC 3.3 (L) 01/31/2018    HGB 9.2 (L) 01/31/2018    HCT 27.2 (L) 01/31/2018    MCV 93.2 01/31/2018    PLT 16 (LL) 01/31/2018     Lab Results   Component Value Date    NA 133 (L) 01/31/2018    K 4.3 01/31/2018    CL 96 (L) 01/31/2018    CO2 28 01/31/2018    BUN 29 (H) 01/31/2018    CREATININE <0.5 (L) 01/31/2018    GLUCOSE 181 (H) 01/31/2018    CALCIUM 8.3 01/31/2018    PROT 5.5 (L) 01/31/2018    LABALBU 2.9 (L) 01/31/2018    BILITOT 0.3 01/31/2018    ALKPHOS 101 01/31/2018    AST 25 01/31/2018    ALT 59 (H) 01/31/2018    LABGLOM >60 01/31/2018    GFRAA >60 01/31/2018    AGRATIO 1.0 (L) 01/22/2018    GLOB 3.1 01/22/2018     Lab Results   Component Value Date    INR 1.12 01/31/2018    INR 1.11 01/27/2018    INR 1.18 (H) 01/25/2018    PROTIME 12.8 01/31/2018    PROTIME 12.7 01/27/2018    PROTIME 13.5 (H) 01/25/2018     Lab Results   Component Value Date    TROPONINI <0.01 01/22/2018     Lab Results   Component Value Date    ALT 59 (H) 01/31/2018    AST 25 01/31/2018    ALKPHOS 101 01/31/2018    BILITOT 0.3 01/31/2018     Lab Results   Component Value Date    ALT 59 (H) 01/31/2018    AST 25 01/31/2018    ALKPHOS 101 01/31/2018    BILITOT 0.3 01/31/2018       01/21/2018 TTE: EF 50-55%. No def RWMA (inferior and inferoseptum was normal). No sig valve regurgitation or stenosis.    01/22/18 ECGs reviewed.  Earlier ecg seemed to demonstrate acute inferior MI but Tns negative and further ecgs showed evolution other ST segments suggesting acute pericarditis.    01/21/2018 CTA Chest: No PE. Tree-in-bud bilateral opacities suggesting atypical infection/inflammation. Enlarged L axillary notes unchanged. Ribs, spine, and scapula lesions suggesting MM/bony mets. No sig pericardial effusion noted.    01/22/18 CXR: Hyperinflated lungs. No definite acute consolidation      Current Facility-Administered Medications:   ???  amiodarone (CORDARONE) tablet 200 mg, 200 mg, Oral, Daily, Krystal Clark, MD  ???  guaiFENesin [facility]) extended release tablet 600 mg, 600 mg, Oral, BID, Neville Route, MD, 600 mg at 01/31/18 1022  ???  acetaminophen (TYLENOL) tablet 650 mg, 650 mg, Oral, PRN, Jody M Moehring, APRN - CNP, 650 mg at 01/27/18 0622  ???  diphenhydrAMINE (BENADRYL) tablet 25 mg, 25  mg, Oral, PRN, Jody M Moehring, APRN - CNP, 25 mg at 01/27/18 3419  ???  diltiazem (CARDIZEM CD) extended release capsule 240 mg, 240 mg, Oral, Daily, Krystal Clark, MD, 240 mg at 01/31/18 6222  ???  potassium phosphate 30 mmol in dextrose 5 % 250 mL IVPB, 30 mmol, Intravenous, Daily PRN, Tomi Likens, APRN - CNP, Stopped at 01/28/18 1018  ???  celecoxib (CELEBREX) capsule 100 mg, 100 mg, Oral, BID, Megan M Earhart, APRN - CNP, 100 mg at 01/31/18 0909  ???  methylPREDNISolone sodium (SOLU-MEDROL) injection 40 mg, 40 mg, Intravenous, Q12H, Charleen Kirks, MD, 40 mg at 01/31/18 1636  ???  insulin lispro (HUMALOG) injection pen 0-18 Units, 0-18 Units, Subcutaneous, TID WC, Megan M Earhart, APRN - CNP, 3 Units at 01/31/18 1647  ???  insulin lispro (HUMALOG) injection pen 0-9 Units, 0-9 Units, Subcutaneous, Nightly, Megan M Earhart, APRN - CNP, 2 Units at 01/30/18 2042  ???  glucose (GLUTOSE) 40 % oral gel 15 g, 15 g, Oral, PRN, Megan M Earhart, APRN - CNP  ???  dextrose 50 % solution 12.5 g, 12.5 g, Intravenous, PRN, Wayland Salinas, APRN - CNP  ???  glucagon (rDNA)  injection 1 mg, 1 mg, Intramuscular, PRN, Megan M Earhart, APRN - CNP  ???  dextrose 5 % solution, 100 mL/hr, Intravenous, PRN, Wayland Salinas, APRN - CNP  ???  potassium chloride 20 mEq/50 mL IVPB (Central Line), 20 mEq, Intravenous, PRN, Megan M Earhart, APRN - CNP  ???  magnesium sulfate 2 g in 50 mL IVPB premix, 2 g, Intravenous, PRN, Wayland Salinas, APRN - CNP, Stopped at 01/29/18 0946  ???  [COMPLETED] anidulafungin (ERAXIS) 200 mg in dextrose 5 % 260 mL IVPB, 200 mg, Intravenous, Once, Stopped at 01/25/18 1510 **AND** anidulafungin (ERAXIS) 100 mg in dextrose 5 % 130 mL IVPB, 100 mg, Intravenous, Q24H, Megan M Earhart, APRN - CNP, Stopped at 01/31/18 1306  ???  0.9 % sodium chloride infusion, , Intravenous, Continuous, Megan M Earhart, APRN - CNP, Last Rate: 20 mL/hr at 01/31/18 1022  ???  LORazepam (ATIVAN) injection 1 mg, 1 mg, Intravenous, Q6H PRN, Charleen Kirks, MD, 1 mg at 01/25/18 0408  ???  albuterol (PROVENTIL) nebulizer solution 2.5 mg, 2.5 mg, Nebulization, Q4H PRN, Harlene Salts, MD  ???  metoprolol tartrate (LOPRESSOR) tablet 25 mg, 25 mg, Oral, BID, Hervey Ard, MD, 25 mg at 01/31/18 9798  ???  atorvastatin (LIPITOR) tablet 10 mg, 10 mg, Oral, Nightly, Verne Grain., DO, 10 mg at 01/30/18 2038  ???  bisacodyl (DULCOLAX) EC tablet 10 mg, 10 mg, Oral, Daily PRN, Verne Grain., DO  ???  calcium elemental (OSCAL) tablet 500 mg, 500 mg, Oral, BID, Verne Grain., DO, 500 mg at 01/31/18 1022  ???  fentaNYL (DURAGESIC) 50 MCG/HR 1 patch, 1 patch, Transdermal, Q72H, Verne Grain., DO, 1 patch at 01/28/18 2159  ???  gabapentin (NEURONTIN) capsule 300 mg, 300 mg, Oral, TID, Verne Grain., DO, 300 mg at 01/31/18 1311  ???  ondansetron [facility]) tablet 8 mg, 8 mg, Oral, Q6H PRN, Verne Grain., DO, 8 mg at 01/29/18 2039  ???  oxyCODONE-acetaminophen (PERCOCET) 7.5-325 MG per tablet 1 tablet, 1 tablet, Oral, Q4H PRN, Verne Grain., DO, 1 tablet at 01/31/18 0847  ???  pantoprazole (PROTONIX) tablet  40 mg, 40 mg, Oral, QAM AC, Verne Grain., DO, 40 mg  at 01/31/18 7253  ???  prochlorperazine (COMPAZINE) tablet 10 mg, 10 mg, Oral, Q6H PRN, Verne Grain., DO  ???  sennosides-docusate sodium (SENOKOT-S) 8.6-50 MG tablet 2 tablet, 2 tablet, Oral, Daily, Verne Grain., DO, 2 tablet at 01/31/18 0908  ???  sertraline (ZOLOFT) tablet 50 mg, 50 mg, Oral, Daily, Verne Grain., DO, 50 mg at 01/31/18 6644  ???  valACYclovir (VALTREX) tablet 500 mg, 500 mg, Oral, BID, Verne Grain., DO, 500 mg at 01/31/18 0347  ???  meropenem (MERREM) 1 g in sodium chloride 0.9 % 100 mL IVPB (mini-bag), 1 g, Intravenous, Q8H, Verne Grain., DO, Last Rate: 200 mL/hr at 01/31/18 1633, 1 g at 01/31/18 1633  ???  sodium chloride flush 0.9 % injection 10 mL, 10 mL, Intravenous, 2 times per day, Verne Grain., DO, 10 mL at 01/31/18 1023  ???  sodium chloride flush 0.9 % injection 10 mL, 10 mL, Intravenous, PRN, Verne Grain., DO  ???  potassium chloride 20 mEq/50 mL IVPB (Central Line), 20 mEq, Intravenous, PRN, Verne Grain., DO  ???  magnesium sulfate 4 g in 100 mL IVPB premix, 4 g, Intravenous, PRN, Verne Grain., DO  ???  magnesium hydroxide (MILK OF MAGNESIA) 400 MG/5ML suspension 30 mL, 30 mL, Oral, Daily PRN, Verne Grain., DO  ???  Saline Mouthwash 15 mL, 15 mL, Swish & Spit, 4x Daily AC & HS, Verne Grain., DO, 15 mL at 01/31/18 1644  ???  Saline Mouthwash 15 mL, 15 mL, Swish & Spit, Q4H PRN, Verne Grain., DO  ???  alteplase (CATHFLO) injection 2 mg, 2 mg, Intracatheter, PRN, Verne Grain., DO  ???  tiotropium [facility]) inhalation capsule 18 mcg, 18 mcg, Inhalation, Daily, Verne Grain., DO, 18 mcg at 01/31/18 1041  ???  budesonide (PULMICORT) nebulizer suspension 250 mcg, 0.25 mg, Nebulization, BID, 250 mcg at 01/31/18 1038 **AND** formoterol (PERFOROMIST) nebulizer solution 20 mcg, 20 mcg, Nebulization, BID, Verne Grain., DO, 20 mcg at 01/31/18 1038  ???  LORazepam (ATIVAN) tablet 1 mg,  1 mg, Oral, Q4H PRN, Verne Grain., DO, 1 mg at 01/27/18 2227  ???  levalbuterol (XOPENEX) nebulizer solution 1.25 mg, 1.25 mg, Nebulization, Q4H WA, Verne Grain., DO, 1.25 mg at 01/31/18 1617    A/P:  62 y.o. admitted with severe SOB.  -Acute pericarditis  -Multiple myeloma  -COPD  -Neutropenia  -Pneumonia with "tree in bud" appearance of CT chest  -Hyperlipidemia  -parox A fib with RVR    Recs:  -Amiodarone 200 mg daily  -Dilt to 240 mg oral daily. Go to 360 if tolerates later  -No a/c given myeloma/pancytopenia    David Watkins A. Bunnie Philips, MD, [facility], [facility]

## 2018-01-31 NOTE — Behavioral Health Treatment Team (Signed)
Psychology Progress Note    Psychological trainee attended rounds and checked-in with Pt and his wife. Pt expressed concerns regarding his longevity; however, he was appreciative that his health was trending upwards. Will continue to follow and support.    Alison Stalling, [place]

## 2018-01-31 NOTE — Care Coordination-Inpatient (Signed)
Type of Admission  Multiple Myeloma/ Plasma Cell Leukemia  C1 D18 DCEP  Admitted with SOB        Central venous catheter  Right Single Lumen Port ( 01/12/18)        Plan          Update  01/25/18:n  Admitted through the Emergency Room with acute shortness of breath. Now being treated with acute pericarditis.  States he is feeling better.  Out of bed to chair, audible wheezing.  Currently of a Amiofdarone infusion.  01/26/18: Walked to bathroom & became very dyspneic & congested.  Assisted back to bed with the assist of 2 nurses.  Medicated with IV Morphine.  01/31/18:  Positive culture for galactomanan & aspergillus.  Currently on room air.  For  Possible bronchoscopy mid-week.        Education          Discharge  To be determined        Pending  01/31/18:  [place] Form Completed

## 2018-02-01 ENCOUNTER — Inpatient Hospital Stay: Admit: 2018-02-01 | Payer: BLUE CROSS/BLUE SHIELD | Primary: Hematology & Oncology

## 2018-02-01 LAB — BASIC METABOLIC PANEL
Anion Gap: 9 (ref 3–16)
BUN: 25 mg/dL — ABNORMAL HIGH (ref 7–20)
CO2: 26 mmol/L (ref 21–32)
Calcium: 8.1 mg/dL — ABNORMAL LOW (ref 8.3–10.6)
Chloride: 96 mmol/L — ABNORMAL LOW (ref 99–110)
Creatinine: <0.5 mg/dL — ABNORMAL LOW (ref 0.9–1.3)
GFR African American: >60 (ref >60)
GFR Non-African American: >60 (ref >60)
Glucose: 190 mg/dL — ABNORMAL HIGH (ref 70–99)
Potassium: 4.5 mmol/L (ref 3.5–5.1)
Sodium: 131 mmol/L — ABNORMAL LOW (ref 136–145)

## 2018-02-01 LAB — POCT GLUCOSE
POC Glucose: 202 mg/dl — ABNORMAL HIGH (ref 70–99)
POC Glucose: 144 mg/dl — ABNORMAL HIGH (ref 70–99)
POC Glucose: 228 mg/dl — ABNORMAL HIGH (ref 70–99)
POC Glucose: 159 mg/dl — ABNORMAL HIGH (ref 70–99)

## 2018-02-01 LAB — CBC WITH AUTO DIFFERENTIAL
Bands Relative: 6 % (ref 0–7)
Basophils %: 0 %
Basophils Absolute: 0 10*3/uL (ref 0.0–0.2)
Eosinophils %: 0 %
Eosinophils Absolute: 0 10*3/uL (ref 0.0–0.6)
Hematocrit: 25 % — ABNORMAL LOW (ref 40.5–52.5)
Hemoglobin: 8.3 g/dL — ABNORMAL LOW (ref 13.5–17.5)
Lymphocytes %: 4 %
Lymphocytes Absolute: 0.1 10*3/uL — ABNORMAL LOW (ref 1.0–5.1)
MCH: 31.1 pg (ref 26.0–34.0)
MCHC: 33.4 g/dL (ref 31.0–36.0)
MCV: 93.3 fL (ref 80.0–100.0)
MPV: 9.8 fL (ref 5.0–10.5)
Monocytes %: 7 %
Monocytes Absolute: 0.2 10*3/uL (ref 0.0–1.3)
Neutrophils %: 83 %
Neutrophils Absolute: 2.8 10*3/uL (ref 1.7–7.7)
Platelets: 14 10*3/uL — CL (ref 135–450)
RBC: 2.68 M/uL — ABNORMAL LOW (ref 4.20–5.90)
RDW: 17.9 % — ABNORMAL HIGH (ref 12.4–15.4)
WBC: 3.2 10*3/uL — ABNORMAL LOW (ref 4.0–11.0)

## 2018-02-01 LAB — PHOSPHORUS: Phosphorus: 2.3 mg/dL — ABNORMAL LOW (ref 2.5–4.9)

## 2018-02-01 LAB — MAGNESIUM: Magnesium: 2 mg/dL (ref 1.80–2.40)

## 2018-02-01 MED FILL — GABAPENTIN 300 MG PO CAPS: 300 mg | ORAL | Qty: 1

## 2018-02-01 MED FILL — METOPROLOL TARTRATE 25 MG PO TABS: 25 mg | ORAL | Qty: 1

## 2018-02-01 MED FILL — METHYLPREDNISOLONE SODIUM SUCC 40 MG IJ SOLR: 40 mg | INTRAMUSCULAR | Qty: 40

## 2018-02-01 MED FILL — DILTIAZEM HCL ER COATED BEADS 240 MG PO CP24: 240 mg | ORAL | Qty: 1

## 2018-02-01 MED FILL — OYSTER SHELL CALCIUM 500 MG PO TABS: 500 mg | ORAL | Qty: 1

## 2018-02-01 MED FILL — SODIUM CHLORIDE 0.9 % IV SOLN: 0.9 % | INTRAVENOUS | Qty: 1000

## 2018-02-01 MED FILL — LORAZEPAM 1 MG PO TABS: 1 mg | ORAL | Qty: 1

## 2018-02-01 MED FILL — OXYCODONE-ACETAMINOPHEN 7.5-325 MG PO TABS: 7.5-325 mg | ORAL | Qty: 1

## 2018-02-01 MED FILL — SERTRALINE HCL 50 MG PO TABS: 50 mg | ORAL | Qty: 1

## 2018-02-01 MED FILL — LEVALBUTEROL HCL 1.25 MG/0.5ML IN NEBU: 1.25 MG/0.5ML | RESPIRATORY_TRACT | Qty: 1

## 2018-02-01 MED FILL — VALACYCLOVIR HCL 500 MG PO TABS: 500 mg | ORAL | Qty: 1

## 2018-02-01 MED FILL — FENTANYL 50 MCG/HR TD PT72: 50 ug/h | TRANSDERMAL | Qty: 1

## 2018-02-01 MED FILL — BUDESONIDE 0.25 MG/2ML IN SUSP: 0.25 MG/2ML | RESPIRATORY_TRACT | Qty: 2

## 2018-02-01 MED FILL — MUCINEX 600 MG PO TB12: 600 mg | ORAL | Qty: 1

## 2018-02-01 MED FILL — CELEBREX 100 MG PO CAPS: 100 mg | ORAL | Qty: 1

## 2018-02-01 MED FILL — MEROPENEM 1 G IV SOLR: 1 g | INTRAVENOUS | Qty: 1

## 2018-02-01 MED FILL — ERAXIS 100 MG IV SOLR: 100 mg | INTRAVENOUS | Qty: 30

## 2018-02-01 MED FILL — SENNOSIDES-DOCUSATE SODIUM 8.6-50 MG PO TABS: 8.6-50 mg | ORAL | Qty: 2

## 2018-02-01 MED FILL — PERFOROMIST 20 MCG/2ML IN NEBU: 20 MCG/2ML | RESPIRATORY_TRACT | Qty: 2

## 2018-02-01 MED FILL — LIPITOR 20 MG PO TABS: 20 mg | ORAL | Qty: 1

## 2018-02-01 MED FILL — AMIODARONE HCL 200 MG PO TABS: 200 mg | ORAL | Qty: 1

## 2018-02-01 MED FILL — PANTOPRAZOLE SODIUM 40 MG PO TBEC: 40 mg | ORAL | Qty: 1

## 2018-02-01 NOTE — Plan of Care (Signed)
Problem: Falls - Risk of:  Goal: Will remain free from falls  Description  Will remain free from falls  Outcome: Ongoing  Note:   Pt is a high fall risk. See Lattie Corns Fall Score and ABCDS Injury Risk assessments.   Explained fall risk precautions to pt and family and rationale behind their use to keep the patient safe. Pt bed is in low position, side rails up, call light and belongings are in reach. Fall wristband applied and present on pts wrist. Wife at bedside, both agree to call RN before getting OOB.  Pt encouraged to call for assistance. Will continue with hourly rounds for PO intake, pain needs, toileting and repositioning as needed.        Problem: Pain:  Goal: Pain level will decrease  Description  Pain level will decrease  Outcome: Ongoing  Note:   Pt complains chronic lower back pain this shift. Medicated with PRN percocet and pt verbalizes relief. Pain under control per pt at this time. Will continue to assess.      Problem: Nutrition  Goal: Optimal nutrition therapy  Outcome: Ongoing     Problem: Cardiac:  Goal: Ability to maintain vital signs within normal range will improve  Description  Ability to maintain vital signs within normal range will improve  Outcome: Ongoing  Note:   Pt switched from gtt to oral Amiodarone and remains in NSR with stable BP and HR this shift. Remains on tele. Will continue to monitor.      Problem: Bleeding:  Goal: Will show no signs and symptoms of excessive bleeding  Description  Will show no signs and symptoms of excessive bleeding  Outcome: Ongoing  Note:   Patient's hemoglobin this AM:   Recent Labs     02/01/18  0357   HGB 8.3*     Patient's platelet count this AM:   Recent Labs     02/01/18  0357   PLT 14*   Thrombocytopenic precautions in place.  Patient showing no signs or symptoms of active bleeding. Transfusion not indicated at this time. Patient verbalizes understanding of all instructions. Will continue to assess and implement POC. Call light within reach and  hourly rounding in place.          Problem: Breathing Pattern - Ineffective:  Goal: Ability to achieve and maintain a regular respiratory rate will improve  Description  Ability to achieve and maintain a regular respiratory rate will improve  Outcome: Ongoing  Note:   Pt continues with strong cough and dyspnea with exertion. Wheezes noted to lungs bilaterally. O2 saturation remains WNL on room air. Utilizing suction for productive cough.      Problem: PROTECTIVE PRECAUTIONS  Goal: Patient will remain free of nosocomial Infections  Outcome: Ongoing   Pt, staff, and visitors adhering to handwashing guidelines. Pt showers daily with chlorhexidine and linens changed daily per protocol. Pt verbalizes understanding of low microbial diet. Will continue to monitor.

## 2018-02-01 NOTE — Significant Event (Signed)
This NP called to pts room by charge RN to assess pt post-fall. Pt states he was standing at bedside using the urinal when he felt his legs begin to buckle and weaken and he fell. He states he landed on his butt and denies injury to any other area (denies head, chest, extremity trauma). No noted lacerations or bruising on skin check. He denies any associated lightheadedness/dizziness. Did not lose consciousness. States his legs simply became weak, specifically refers to bilateral quadriceps. 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 PT/OT already working w/pt. Nursing assisted pt back to bed. VSS. Cont high-fall risk precautions. No further evaluation needed at this time. Pt knows to notify nursing if he develops any new pain.

## 2018-02-01 NOTE — Plan of Care (Signed)
Problem: Falls - Risk of:  Goal: Absence of physical injury  Description  Absence of physical injury  Note:    Pt is a High fall risk. See Lattie Corns Fall Score and ABCDS Injury Risk assessments. Explained fall risk precautions to pt and family and rationale behind their use to keep the patient safe. Pt bed is in low position, side rails up, call light and belongings are in reach. Fall wristband applied and present on pts wrist.  Chair Alarm on.  Pt encouraged to call for assistance. Will continue with hourly rounds for PO intake, pain needs, toileting and repositioning as needed.        Problem: Pain:  Goal: Control of chronic pain  Description  Control of chronic pain  Note:   Patient complained of pleuritic and lowe back pain. Administered analgesic as per order. Upon re-assessment patient verbalized adequate relief. Will continue to monitor.      Problem: Cardiac:  Goal: Ability to maintain vital signs within normal range will improve  Description  Ability to maintain vital signs within normal range will improve  Note:   Patient remains on Tele:NSR, he denies any chest pain or discomfort. Will continue to monitor.      Problem: Bleeding:  Goal: Will show no signs and symptoms of excessive bleeding  Description  Will show no signs and symptoms of excessive bleeding  Note:   Patient's hemoglobin this AM:   Recent Labs     02/01/18  0357   HGB 8.3*     Patient's platelet count this AM:   Recent Labs     02/01/18  0357   PLT 14*    Thrombocytopenia Precautions in place.  Patient showing no signs or symptoms of active bleeding.  Transfusion not indicated at this time.  Patient verbalizes understanding of all instructions. Will continue to assess and implement POC. Call light within reach and hourly rounding in place.         Problem: Breathing Pattern - Ineffective:  Goal: Ability to achieve and maintain a regular respiratory rate will improve  Description  Ability to achieve and maintain a regular respiratory rate will  improve  Note:   Patient with crackles and wheezes through his lungs, he verbalized shortness of breath with exertion. He remains in RA, and continues to receive breathing treatment per RT.      Problem: PROTECTIVE PRECAUTIONS  Goal: Patient will remain free of nosocomial Infections  Note:   Pt remains in protective precautions. No living plants or fresh flowers in his room. Patient educated on wearing mask when in hallways. Patient, staff, and visitors adhering to handwashing guidelines. Patient cleansed with chlorhexidine wipes and linens changed daily per protocol. Pt verbalizes understanding of low microbial diet. Patient remains free of nosocomial infections.

## 2018-02-01 NOTE — Progress Notes (Signed)
[facility] Progress Note    02/01/2018     David Watkins    MRN: [id number]    DOB: 1956-01-13    SUBJECTIVE:  In chair - breathing better - bronch today or tomorrow    ECOG PS:  (2) Ambulatory and capable of self care, unable to carry out work activity, up and about > 50% or waking hours    Isolation: None    Medications    Scheduled Meds:  ??? amiodarone  200 mg Oral Daily   ??? guaiFENesin  600 mg Oral BID   ??? diltiazem  240 mg Oral Daily   ??? celecoxib  100 mg Oral BID   ??? methylPREDNISolone  40 mg Intravenous Q12H   ??? insulin lispro  0-18 Units Subcutaneous TID WC   ??? insulin lispro  0-9 Units Subcutaneous Nightly   ??? anidulafungin  100 mg Intravenous Q24H   ??? metoprolol tartrate  25 mg Oral BID   ??? atorvastatin  10 mg Oral Nightly   ??? calcium elemental  500 mg Oral BID   ??? fentaNYL  1 patch Transdermal Q72H   ??? gabapentin  300 mg Oral TID   ??? pantoprazole  40 mg Oral QAM AC   ??? sennosides-docusate sodium  2 tablet Oral Daily   ??? sertraline  50 mg Oral Daily   ??? valACYclovir  500 mg Oral BID   ??? meropenem  1 g Intravenous Q8H   ??? sodium chloride flush  10 mL Intravenous 2 times per day   ??? Saline Mouthwash  15 mL Swish & Spit 4x Daily AC & HS   ??? tiotropium  18 mcg Inhalation Daily   ??? budesonide  0.25 mg Nebulization BID    And   ??? formoterol  20 mcg Nebulization BID   ??? levalbuterol  1.25 mg Nebulization Q4H WA     Continuous Infusions:  ??? dextrose     ??? sodium chloride 20 mL/hr at 01/31/18 1022     PRN Meds:.acetaminophen, diphenhydrAMINE, potassium phosphate IVPB, glucose, dextrose, glucagon (rDNA), dextrose, potassium chloride, magnesium sulfate, LORazepam, albuterol, bisacodyl, ondansetron, oxyCODONE-acetaminophen, prochlorperazine, sodium chloride flush, potassium chloride, magnesium sulfate, magnesium hydroxide, Saline Mouthwash, alteplase, LORazepam    ROS:  As noted above, otherwise remainder of 10-point ROS negative    Physical Exam:     I&O:      Intake/Output Summary (Last 24 hours) at 02/01/2018  0837  Last data filed at 02/01/2018 0347  Gross per 24 hour   Intake 1192 ml   Output 1800 ml   Net -608 ml       Vital Signs:  BP 114/82    Pulse 59    Temp 97.8 ??F (36.6 ??C) (Oral)    Resp 11    Ht 5' 9" (1.753 m)    Wt 195 lb 8 oz (88.7 kg)    SpO2 94%    BMI 28.87 kg/m??     Weight:    Wt Readings from Last 3 Encounters:   02/01/18 195 lb 8 oz (88.7 kg)   01/18/18 194 lb 9.6 oz (88.3 kg)   01/10/18 194 lb 12.8 oz (88.4 kg)         ??  General: Awake, alert and oriented.  HEENT:??normocephalic, alopecia,??PERRL, no scleral erythema or icterus, Oral mucosa moist and intact  LYMPH: ??Left axillary lymphadenopathy??  NECK: supple without palpable adenopathy  BACK: Straight negative CVAT  SKIN:??warm dry and intact without lesions rashes or masses  CHEST:??rhonchi w/  insp/exp wheeze,??without use of accessory muscles  CV: Normal S1 S2, irregular, no MRG  ABD:??NT ND normoactive BS, no palpable masses or hepatosplenomegaly  EXTREMITIES:??without edema, denies calf tenderness  NEURO: CN II - XII grossly intact  CATHETER:??Right IJ PAC (06/21/17, [facility])??- CDI        Data    CBC:   Recent Labs     01/30/18  0330 01/31/18  0400 02/01/18  0357   WBC 2.2* 3.3* 3.2*   HGB 8.7* 9.2* 8.3*   HCT 25.5* 27.2* 25.0*   MCV 92.7 93.2 93.3   PLT 18* 16* 14*     BMP/Mag:  Recent Labs     01/30/18  0330 01/31/18  0400 02/01/18  0357   NA 137 133* 131*   K 4.6 4.3 4.5   CL 100 96* 96*   CO2 _0 PHOS 2.5 2.2* 2.3*   BUN 29* 29* 25*   CREATININE <0.5* <0.5* <0.5*   MG 2.00 1.80 2.00     LIVP:   Recent Labs     01/31/18  0400   AST 25   ALT 59*   BILIDIR <0.2   BILITOT 0.3   ALKPHOS 101     Coags:   Recent Labs     01/31/18  0400   PROTIME 12.8   INR 1.12   APTT 24.3*     Uric Acid   Recent Labs     01/31/18  0400   LABURIC 1.9*       Diagnostics:  1. ??PET scan (01/10/18):    ??    2.  CTA Chest (525/19):  1. ??No pulmonary embolism.  2. ??Patchy groundglass opacities and tree-in-bud are seen throughout the   lungs which are new compared to prior  examination. ??This most likely   represents atypical infection/inflammation. ??Clinical correlation is   recommended.  3. ??Paraseptal and centrilobular emphysematous change of the lungs.  4. ??Enlarged left axillary lymph nodes are not significantly changed   compared to PET/CT on 01/10/2018. ??This is worrisome for nodal metastasis.  5. ??Scattered lucent lesions are seen throughout the ribs, spine, and   scapula. ??Findings may represent multiple myeloma or osseous metastasis.    3.  Echocardiogram (01/23/18):  ??Technically difficult examination.  ??Normal left ventricle size, wall thickness, and systolic function with an  ??estimated ejection fraction of 50-55%. No obvious regional wall motion  ??abnormalities are seen (inferior and inferoseptal walls do appear normal, as  ??clinically questioned). Diastolic filling parameters suggests normal  ??diastolic function.  ??Aortic valve appears sclerotic but opens adequately.  ??Possible small/organized pericardial effusion though fat pad cannot be ruled  ??out.    4.  CT Chest (01/31/18):  Stable innumerable pulmonary nodules bilaterally favoring metastatic disease.    Minimal decrease in size of dominant left axillary lymph node Jan 27, 2018    Stable osteolytic lesions of the axial skeleton, likely metastatic.    Myeloma Labs (01/03/18):  SPEP:????reveals that M2, previously characterized as monoclonal IgG kappa in  mid-to-slow gamma is 1.0 gm/dL, greater than the 0.2 gm/dL observed on 08 Nov 2017.??  SIFE:????Serum immunofixation electrophoresis reveals persistent M2 in mid-to-slow gamma, a monoclonal IgG kappa. ??A recent M-spike, M1, was minor monoclonal IgG lambda in mid-gamma; M1 continues to be absent.  SFLC:  Kappa:  30.80, previously (11/08/17) -??10.40  Lambda:  1.46, previously (11/08/17) -??5.97  Free Kappa Lambda Ratio:  21.10, previously (11/08/17) -??1.74  Quantitative Immunoglobulins:  IgG:??  1480,  previously (11/08/17) - 552  IgA:??  <26, previously (11/08/17)  -??30  IgM:??  <20    Myeloma Labs (01/27/18):  Serum Light Chains: Pending    SPE/IFE:??M-protein persists in the gamma region on serum protein electrophoresis. The amount of this protein is 0.5 gm/dL     Quantitative Immunoglobulins:  IgG:??  791  IgA:??  17  IgM:??  11  ??  PROBLEM LIST: ??????????   ????  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 ??COPD   9.????Influenza A??(10/12/17)  10.  COPD Exacerbation (12/2017)  11.  Pericarditis (12/2017)  12.  A. Fib w/ RVR (12/2017)  ????  TREATMENT: ??????????   ??  1. Rad Tx to T5-7, T10-L3, Right Scapula - 3000 cGy - Dr. Jamas Lav 03/20/16-04/01/16  2. RVD x1 03/20/16 - discontinued d/t rash   3. Velcade/Pomalyst/Dex x3 cycles 04/23/16-06/17/16 - discontinued d/t reaction to pomalyst  4. Velcade/Dex x 1 cycle 06/26/16 (last dose of dexamethasone 07/22/16  5. High-dose melphalan followed by administration of PBSCs 2.36 x10^6 cd34cells/kg on 08/28/16  6. Maintenance Revlimid 34m daily (12/2016-04/23/17)  7. Dexamethasone 476mdaily (04/24/17)  8. DCEP   Cycle #1 - 04/26/17  Cycle #2 - 05/25/17 - excellent response  9.??Dara/Velcade/Dex (C1D1 - 06/22/17) - BMBx ??10/18/17 - CR  Cycles 7-8 (21 day cycle)  - Dex 20 mg po D4,5.8,9,11 and 12  - Dara 16 mg/Kg D1 only with Dex 20 mg IV  - Velcade 1.3 mg/M2 D1,4,8 and 11  Cycle 9 and beyond (maintenance, 11/08/17??- 01/03/18)  Dara 16 mg/Kg Q 28 days  Dex 12 mg IV D1  Velcade Q 2 weeks??  10. DCEP   Cycle #1 - 01/13/18  ??  ASSESSMENT AND PLAN:??????????   ??  1. ??IgG kappa multiple myeloma / Plasma Cell Leukemia:??Relapsed disease??  - S/p??treatment??Dara/Velcade/Dex??x 8 cycles??(06/22/17 - 10/2017). Followed by??maintenance Dara??Q4 wks/Velcade??Q2ks/Dex??(started 11/08/17)  - Now w/ progressive disease based on myeloma labs (01/03/18) & PET scan (01/10/18), see  above??  ??  PLAN:??DCEP for disease and pain control. Hope for allogeneic transplant in CR2, but needs to disease response and improvement in lung function??    - M-spike (01/27/18) - 0.5 g/dL, previously 1.0 g/dL  ??  DCEP Cycle #1, Day + 20    ??  2. ??ID: Afebrile w/ possible bacterial and fungal multifocal PNA  - Fungitell and galactomannan (01/25/18) - Positive   - Cont Valtrex ppx  - Cont Meropenem Day + 11  - Eraxis Day + 8    3. Heme:??Chemotherapy induced pancytopenia   - Transfuse for Hgb < 7 and Platelets <??10K  - Platelet transfusion today prior to bronchoscopy   - S/p Neulasta (01/19/18)  ??  4.??Metabolic: Stable renal fxn and e-lytes except for steroid-induced hyperglycemia, hypoNa and hypoPhos  - Cont high regimen Lispro SSI, AC&HS  - S/p Lasix 40 mg IV daily (01/25/18 - 01/30/18)   - Cont IVF:  NS @ 20 mL/hr  - Keep Mg > 2 and K+ > 4.0 and Phos > 2.0    5. Pulmonary:??Acute respiratory distress with mild hypercapnia and hypoxemia from COPD exacerbation and underlying PNA has resolved.  He also is being treated for bacterial and fungal PNA which was likely contributing to acute respiratory failure   - Pulm Nodule: Stable??on??CT chest from??04/23/17??&??10/18/17 &??11/26/17; cont to monitor.   - CTPA??(11/26/17): No PE;??resolution  of??mild upper lobe tree-in-bud opacities; Mild emphysema. Two 3 mm LLL pulmonary nodules, stable  - PFT (12/27/17):????compared to??07/2016:??FVC 3.25 L, 66%, and FEV1  1.42 L, 38%. FEV1/FVC is 44%. This is unchanged from prior study. Air trapping seen on lung volumes. Diffusion capacity 3.91, 79% of predicted, down from 5.21??  - CTPA (01/22/18):  No PE, patchy groundglass opacities and tree-in-bud are seen throughout the  lungs which are new compared to prior examination.   - CT chest (01/31/18): Stable innumerable pulmonary nodules bilaterally favoring metastatic disease.  - Pulm following, appreciate recs. Cont regular f/u w/ Dr. Dorann Lodge on d/c  - Cont home inhalers    - Cont Xopenox q4hrs  - Cont Steroid  taper: Solumedrol 80 mg TID (started 01/22/18), 40 mg TID (01/25/18), 40 mg bid (01/26/18), pulm tapering    - Dyspnea improved, but still w/ markedly abnormal lung exam. CT chest (01/31/18) cont to show pulm nodules  - Bronch (02/01/18) - Pending       ??  6. GI/Nutrition: Appetite and oral intake is good??  - Cont PPI ppx w/ steroids??  - Cont low microbial diet ??  Constipation: ??Improved  - Cont Dulcolax prn & Senokot-s 2 tabs daily    ??  7. Cardiac:??Ongoing A fib with RVR  - H/o HLD &??has septal wall defect on echocardiogram.  - Echo (01/23/18):  LVEF 50-55# w/ normal diastolic fxn & no obvious regional wall motion abnormalities  - Cardiology following, appreciate recs  - Cont Lipitor??  - S/p Dilt gtt (stopped 01/27/18) & Amio gtt (stopped 01/31/18), cont Diltiazem CD 240 mg daily, Amiodarone 200 mg daily & Lopressor 25 mg bid  - Cont Solumedrol COPD exacerbation & pericarditis  - Cont Celebrex (started 01/26/18)  ??  8. Psych:  Ongoing concerns about disease progression and ability to get transplant   Anxiety/Depression: Ongoing  - Cont Zoloft??50 mg daily??  - Psych to follow   Insomnia:??No complaints??currently??  ??  9. Peripheral Neuropathy:??Ongoing  - Cont Gabapentin 300 mg??TID  ??  10. 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  11.??Acute on??Chronic left lower back pain d/t Neoplasm:????Improved w/ chemotherapy  - S/p Radiation (09/01/17 - 09/15/17,??Fried)  - Cont Fentanyl patch??50 mcg/hr (increased 01/13/18, Rx 01/18/18)  - Cont Percocet 7.5/325 mg q4hrs prn    - DVT Prophylaxis: Platelets <50,000 cells/dL - prophylactic lovenox on hold and mechanical prophylaxis with bilateral SCDs while in bed in place.  Contraindications to pharmacologic prophylaxis: Thrombocytopenia  Contraindications to mechanical prophylaxis: None    - Disposition: Once  breathing and chest pain improves     Wayland Salinas, APRN - CNP       Merlyn Albert. Drucilla Schmidt, DO, MS  [facility]    Please contact via:  1.  Perfect Serve  2.  Cell Phone:  [phone]    02/01/2018   11:28 AM

## 2018-02-01 NOTE — Progress Notes (Signed)
Patient call light went on and RN entered room to find patient lying on floor in puddle of urine with urinal next to him on the floor. Chair alarm was not on and not plugged into call light on wall. Patient denies turning off chair alarm. Patient states his knees buckled and he fell onto his buttocks. Pt denies hitting head during fall. Vitals stable. Charge RN, Chemical engineer, and NP notified and assessed patient. Patient cleaned up and placed back in chair with chair alarm in place and plugged into call light on wall. Patient educated to call for needs, verbalized understanding. Will continue to monitor.

## 2018-02-01 NOTE — Progress Notes (Signed)
Pulmonary Followup Note    CC: COPD exacerbation, pneumonia, neutropenic fever and myeloma  Subjective:  Reports continued improvement.  However, still having cough and wheezing and appears dyspneic.  Says dyspnea is improving as he can walk even further without panting.  However, he did later have a fall while using the urinal. Continues having pain in his back which is chronic.    ROS:  Denies headache, nausea or chest pain.    24HR INTAKE/OUTPUT:      Intake/Output Summary (Last 24 hours) at 02/01/2018 1759  Last data filed at 02/01/2018 1150  Gross per 24 hour   Intake 1452 ml   Output 1250 ml   Net 202 ml       ??? amiodarone  200 mg Oral Daily   ??? guaiFENesin  600 mg Oral BID   ??? diltiazem  240 mg Oral Daily   ??? celecoxib  100 mg Oral BID   ??? methylPREDNISolone  40 mg Intravenous Q12H   ??? insulin lispro  0-18 Units Subcutaneous TID WC   ??? insulin lispro  0-9 Units Subcutaneous Nightly   ??? anidulafungin  100 mg Intravenous Q24H   ??? metoprolol tartrate  25 mg Oral BID   ??? atorvastatin  10 mg Oral Nightly   ??? calcium elemental  500 mg Oral BID   ??? fentaNYL  1 patch Transdermal Q72H   ??? gabapentin  300 mg Oral TID   ??? pantoprazole  40 mg Oral QAM AC   ??? sennosides-docusate sodium  2 tablet Oral Daily   ??? sertraline  50 mg Oral Daily   ??? valACYclovir  500 mg Oral BID   ??? meropenem  1 g Intravenous Q8H   ??? sodium chloride flush  10 mL Intravenous 2 times per day   ??? Saline Mouthwash  15 mL Swish & Spit 4x Daily AC & HS   ??? tiotropium  18 mcg Inhalation Daily   ??? budesonide  0.25 mg Nebulization BID    And   ??? formoterol  20 mcg Nebulization BID   ??? levalbuterol  1.25 mg Nebulization Q4H WA           PHYSICAL EXAMINATION:  BP 104/62    Pulse 65    Temp 97.7 ??F (36.5 ??C) (Oral)    Resp 15    Ht 5' 9"  (1.753 m)    Wt 195 lb 8 oz (88.7 kg)    SpO2 93%    BMI 28.87 kg/m??   CURRENT PULSE OXIMETRY:  SpO2: 93 %  24HR PULSE OXIMETRY RANGE:  SpO2  Avg: 95.6 %  Min: 93 %  Max: 98 % on  ra      Gen: mild distress. Speaking in full sentences with trace accessory muscle use  HEENT: PERRL, EOMI, OP nl  Lung: inspiratory ronchi and expiratory wheezing throughout  CV: RRR without M/R/R  Abd: +BS, soft, NT/ND  Ext: No edema.    DATA  CBC:   Recent Labs     01/30/18  0330 01/31/18  0400 02/01/18  0357   WBC 2.2* 3.3* 3.2*   HGB 8.7* 9.2* 8.3*   HCT 25.5* 27.2* 25.0*   MCV 92.7 93.2 93.3   PLT 18* 16* 14*     BMP:   Recent Labs     01/30/18  0330 01/31/18  0400 02/01/18  0357   NA 137 133* 131*   K 4.6 4.3 4.5   CL 100 96* 96*   CO2 27  28 26   PHOS 2.5 2.2* 2.3*   BUN 29* 29* 25*   CREATININE <0.5* <0.5* <0.5*     No results for input(s): PHART, PCO2ART, PO2ART in the last 72 hours.  LIVER PROFILE:   Recent Labs     01/31/18  0400   AST 25   ALT 59*   BILIDIR <0.2   BILITOT 0.3   ALKPHOS 101       CXR REVIEWED BY ME AND SHOWED:  CT Chest WO Contrast   Final Result      Stable innumerable pulmonary nodules bilaterally favoring metastatic disease.      Minimal decrease in size of dominant left axillary lymph node Jan 27, 2018      Stable osteolytic lesions of the axial skeleton, likely metastatic.            CT CHEST W CONTRAST   Final Result   1. Interval development of multiple pulmonary nodules seen scattered throughout both lungs. There is also some nodular areas of patchy consolidation within the left lung and some interstitial infiltrate within the right upper lobe periphery. Findings are    nonspecific but favor an inflammatory etiology such as an atypical pneumonia or bronchiolitis. Interval development of pulmonary metastatic disease is considered less likely.   2. Moderate emphysema.   3. Left axillary lymphadenopathy is mildly decreased in comparison the prior study.   4. Previously seen abnormal paraspinal soft tissue identified along the lower thoracic spine has decreased in size and only some small ill-defined nodular soft tissue remains. Findings are consistent with neoplastic disease which is  responding to    therapy.   5. Stable osseous metastatic disease.      XR CHEST PORTABLE   Final Result      No acute pulmonary pathology or change from the prior study                  CTA PULMONARY W CONTRAST   Final Result   1.  No pulmonary embolism.   2.  Patchy groundglass opacities and tree-in-bud are seen throughout the    lungs which are new compared to prior examination.  This most likely    represents atypical infection/inflammation.  Clinical correlation is    recommended.   3.  Paraseptal and centrilobular emphysematous change of the lungs.   4.  Enlarged left axillary lymph nodes are not significantly changed    compared to PET/CT on 01/10/2018.  This is worrisome for nodal metastasis.   5.  Scattered lucent lesions are seen throughout the ribs, spine, and    scapula.  Findings may represent multiple myeloma or osseous metastasis.          XR CHEST STANDARD (2 VW)   Final Result      No acute cardiopulmonary findings.           ASSESSMENT/PLAN:  This is a 62 y.o. male with Fever, pulmonary nodules and multiple myeloma    As per our discussion yesterday patient keeps saying that he's feeling and breathing better but his lung exam remains terrible and he continues to appear dyspneic even at rest.    He does have some weakness as evidenced by his fall today.  We discussed the pulmonary nodules which are unchanged relative to several days ago.  No worsening, but no improvement either since I've been seeing him.  We deliberated over bronchoscopy with him deferring to me.  Ultimately I decided it would be best to  quit talking about it and just do it so that we'll know if it were helpful or not and so we wouldn't wish we'd done it sooner.    Will make him NPO after midnight.  I have a call out to surgery scheduling and am requesting anesthesia which makes scheduling more difficult.  If I can't get a time tomorrow I'll restart his diet.    Neville Route, MD

## 2018-02-01 NOTE — Progress Notes (Signed)
S: No change to SOB or cough. Denies cp    Tele: Sinus     O:  Physical Exam:  BP 114/82    Pulse 59    Temp 97.8 ??F (36.6 ??C) (Oral)    Resp 12    Ht 5' 9"  (1.753 m)    Wt 195 lb 8 oz (88.7 kg)    SpO2 94%    BMI 28.87 kg/m??    General (appearance):  No acute distress  Eyes: anicteric   Neck: soft, No JVD  Ears/Nose/Mouth/Thorat: No cyanosis  CV: RRR   Respiratory:  + wheeze and rhonchi   GI: soft, non-tender, non-distended  Skin: Warm, dry. No rashes  Neuro/Psych: Alert and oriented x 3. Appropriate behavior  Ext:  No c/c. 2+ pitting edema  Pulses:  2+ radial     I.O's= +2.1 liters    Weight  Admission: Weight: 194 lb 10.7 oz (88.3 kg)   Today: Weight: 195 lb 8 oz (88.7 kg)    CBC:   Recent Labs     01/30/18  0330 01/31/18  0400 02/01/18  0357   WBC 2.2* 3.3* 3.2*   HGB 8.7* 9.2* 8.3*   HCT 25.5* 27.2* 25.0*   MCV 92.7 93.2 93.3   PLT 18* 16* 14*     BMP:   Recent Labs     01/30/18  0330 01/31/18  0400 02/01/18  0357   NA 137 133* 131*   K 4.6 4.3 4.5   CL 100 96* 96*   CO2 27 28 26    PHOS 2.5 2.2* 2.3*   BUN 29* 29* 25*   CREATININE <0.5* <0.5* <0.5*     Mag:   Lab Results   Component Value Date    MG 2.00 02/01/2018     LIVER PROFILE:   Recent Labs     01/31/18  0400   AST 25   ALT 59*   BILIDIR <0.2   BILITOT 0.3   ALKPHOS 101     PT/INR:   Recent Labs     01/31/18  0400   PROTIME 12.8   INR 1.12     APTT:   Recent Labs     01/31/18  0400   APTT 24.3*     Pro-BNP:   Lab Results   Component Value Date    PROBNP 324 01/21/2018       Imaging:    02/01/2018 CT chest:     Stable innumerable pulmonary nodules bilaterally favoring metastatic disease.   ??   Minimal decrease in size of dominant left axillary lymph node Jan 27, 2018   ??   Stable osteolytic lesions of the axial skeleton, likely metastatic.     01/31/2018 ECG: NSR    01/27/2018 CT chest:  1. Interval development of multiple pulmonary nodules seen scattered throughout both lungs. There is also some nodular areas of patchy consolidation within the left lung and  some interstitial infiltrate within the right upper lobe periphery. Findings are   ??nonspecific but favor an inflammatory etiology such as an atypical pneumonia or bronchiolitis. Interval development of pulmonary metastatic disease is considered less likely.   2. Moderate emphysema.   3. Left axillary lymphadenopathy is mildly decreased in comparison the prior study.   4. Previously seen abnormal paraspinal soft tissue identified along the lower thoracic spine has decreased in size and only some small ill-defined nodular soft tissue remains. Findings are consistent with neoplastic disease which is responding to  therapy.   5. Stable osseous metastatic disease.       01/21/2018 CTA PE     1. ??No pulmonary embolism.   2. ??Patchy groundglass opacities and tree-in-bud are seen throughout the    lungs which are new compared to prior examination. ??This most likely    represents atypical infection/inflammation. ??Clinical correlation is    recommended.   3. ??Paraseptal and centrilobular emphysematous change of the lungs.   4. ??Enlarged left axillary lymph nodes are not significantly changed    compared to PET/CT on 01/10/2018. ??This is worrisome for nodal metastasis.   5. ??Scattered lucent lesions are seen throughout the ribs, spine, and    scapula. ??Findings may represent multiple myeloma or osseous metastasis.       01/21/2018 TTE: EF 50-55%. No def RWMA (inferior and inferoseptum was normal). No sig valve regurgitation or stenosis.  ??  01/22/18 ECGs reviewed. Earlier ecg seemed to demonstrate acute inferior MI but Tns negative and further ecgs showed evolution other ST segments suggesting acute pericarditis.  ??  01/21/2018 CTA Chest: No PE. Tree-in-bud bilateral opacities suggesting atypical infection/inflammation. Enlarged L axillary notes unchanged. Ribs, spine, and scapula lesions suggesting MM/bony mets. No sig pericardial effusion noted.  ??  01/22/18 CXR: Hyperinflated lungs. No definite acute  consolidation    Assessment:    62 y.o. admitted with severe SOB.  -Acute pericarditis  -Multiple myeloma  -COPD  -Neutropenia  -Pneumonia with "tree in bud" appearance of CT chest  -Hyperlipidemia  -parox A fib with RVR      Plan:  -Keep K>4, Mg>2.  -Amio, diltiazem, metoprolol   -Lipitor

## 2018-02-02 LAB — CBC WITH AUTO DIFFERENTIAL
Basophils %: 0.1 %
Basophils Absolute: 0 10*3/uL (ref 0.0–0.2)
Eosinophils %: 0 %
Eosinophils Absolute: 0 10*3/uL (ref 0.0–0.6)
Hematocrit: 25.2 % — ABNORMAL LOW (ref 40.5–52.5)
Hemoglobin: 8.5 g/dL — ABNORMAL LOW (ref 13.5–17.5)
Lymphocytes %: 2.4 %
Lymphocytes Absolute: 0.1 10*3/uL — ABNORMAL LOW (ref 1.0–5.1)
MCH: 31.5 pg (ref 26.0–34.0)
MCHC: 33.8 g/dL (ref 31.0–36.0)
MCV: 93 fL (ref 80.0–100.0)
MPV: 10 fL (ref 5.0–10.5)
Monocytes %: 8.1 %
Monocytes Absolute: 0.3 10*3/uL (ref 0.0–1.3)
Neutrophils %: 89.4 %
Neutrophils Absolute: 3 10*3/uL (ref 1.7–7.7)
Platelets: 13 10*3/uL — CL (ref 135–450)
RBC: 2.7 M/uL — ABNORMAL LOW (ref 4.20–5.90)
RDW: 17.9 % — ABNORMAL HIGH (ref 12.4–15.4)
WBC: 3.4 10*3/uL — ABNORMAL LOW (ref 4.0–11.0)

## 2018-02-02 LAB — BASIC METABOLIC PANEL
Anion Gap: 8 (ref 3–16)
BUN: 22 mg/dL — ABNORMAL HIGH (ref 7–20)
CO2: 27 mmol/L (ref 21–32)
Calcium: 8.2 mg/dL — ABNORMAL LOW (ref 8.3–10.6)
Chloride: 98 mmol/L — ABNORMAL LOW (ref 99–110)
Creatinine: 0.5 mg/dL — ABNORMAL LOW (ref 0.9–1.3)
GFR African American: >60 (ref >60)
GFR Non-African American: >60 (ref >60)
Glucose: 234 mg/dL — ABNORMAL HIGH (ref 70–99)
Potassium: 4.7 mmol/L (ref 3.5–5.1)
Sodium: 133 mmol/L — ABNORMAL LOW (ref 136–145)

## 2018-02-02 LAB — POCT GLUCOSE
POC Glucose: 187 mg/dl — ABNORMAL HIGH (ref 70–99)
POC Glucose: 178 mg/dl — ABNORMAL HIGH (ref 70–99)
POC Glucose: 160 mg/dl — ABNORMAL HIGH (ref 70–99)
POC Glucose: 160 mg/dl — ABNORMAL HIGH (ref 70–99)

## 2018-02-02 LAB — LACTATE DEHYDROGENASE: LD: 181 U/L (ref 100–190)

## 2018-02-02 LAB — HEPATIC FUNCTION PANEL
ALT: 53 U/L — ABNORMAL HIGH (ref 10–40)
AST: 17 U/L (ref 15–37)
Albumin: 2.8 g/dL — ABNORMAL LOW (ref 3.4–5.0)
Alkaline Phosphatase: 100 U/L (ref 40–129)
Bilirubin, Direct: <0.2 mg/dL (ref 0.0–0.3)
Total Bilirubin: 0.3 mg/dL (ref 0.0–1.0)
Total Protein: 5 g/dL — ABNORMAL LOW (ref 6.4–8.2)

## 2018-02-02 LAB — MAGNESIUM: Magnesium: 2 mg/dL (ref 1.80–2.40)

## 2018-02-02 LAB — URIC ACID: Uric Acid, Serum: 1.8 mg/dL — ABNORMAL LOW (ref 3.5–7.2)

## 2018-02-02 LAB — PHOSPHORUS: Phosphorus: 2.3 mg/dL — ABNORMAL LOW (ref 2.5–4.9)

## 2018-02-02 MED ORDER — METOPROLOL TARTRATE 50 MG PO TABS
50 | Freq: Two times a day (BID) | ORAL | Status: DC
Start: 2018-02-02 — End: 2018-02-08
  Administered 2018-02-02 – 2018-02-08 (×11): 50 mg via ORAL

## 2018-02-02 MED ORDER — PREDNISONE 20 MG PO TABS
20 MG | Freq: Two times a day (BID) | ORAL | Status: DC
Start: 2018-02-02 — End: 2018-02-07
  Administered 2018-02-03 – 2018-02-07 (×9): 25 mg via ORAL

## 2018-02-02 MED FILL — MUCINEX 600 MG PO TB12: 600 mg | ORAL | Qty: 1

## 2018-02-02 MED FILL — LORAZEPAM 1 MG PO TABS: 1 mg | ORAL | Qty: 1

## 2018-02-02 MED FILL — ERAXIS 100 MG IV SOLR: 100 mg | INTRAVENOUS | Qty: 30

## 2018-02-02 MED FILL — PERFOROMIST 20 MCG/2ML IN NEBU: 20 MCG/2ML | RESPIRATORY_TRACT | Qty: 2

## 2018-02-02 MED FILL — LEVALBUTEROL HCL 1.25 MG/0.5ML IN NEBU: 1.25 MG/0.5ML | RESPIRATORY_TRACT | Qty: 1

## 2018-02-02 MED FILL — VALACYCLOVIR HCL 500 MG PO TABS: 500 mg | ORAL | Qty: 1

## 2018-02-02 MED FILL — GABAPENTIN 300 MG PO CAPS: 300 mg | ORAL | Qty: 1

## 2018-02-02 MED FILL — SENNOSIDES-DOCUSATE SODIUM 8.6-50 MG PO TABS: 8.6-50 mg | ORAL | Qty: 2

## 2018-02-02 MED FILL — OYSTER SHELL CALCIUM 500 MG PO TABS: 500 mg | ORAL | Qty: 1

## 2018-02-02 MED FILL — METOPROLOL TARTRATE 50 MG PO TABS: 50 mg | ORAL | Qty: 1

## 2018-02-02 MED FILL — AMIODARONE HCL 200 MG PO TABS: 200 mg | ORAL | Qty: 1

## 2018-02-02 MED FILL — METHYLPREDNISOLONE SODIUM SUCC 40 MG IJ SOLR: 40 mg | INTRAMUSCULAR | Qty: 40

## 2018-02-02 MED FILL — OXYCODONE-ACETAMINOPHEN 7.5-325 MG PO TABS: 7.5-325 mg | ORAL | Qty: 1

## 2018-02-02 MED FILL — SERTRALINE HCL 50 MG PO TABS: 50 mg | ORAL | Qty: 1

## 2018-02-02 MED FILL — BUDESONIDE 0.25 MG/2ML IN SUSP: 0.25 MG/2ML | RESPIRATORY_TRACT | Qty: 2

## 2018-02-02 MED FILL — DILTIAZEM HCL ER COATED BEADS 240 MG PO CP24: 240 mg | ORAL | Qty: 1

## 2018-02-02 MED FILL — PANTOPRAZOLE SODIUM 40 MG PO TBEC: 40 mg | ORAL | Qty: 1

## 2018-02-02 MED FILL — MEROPENEM 1 G IV SOLR: 1 g | INTRAVENOUS | Qty: 2

## 2018-02-02 MED FILL — CELEBREX 100 MG PO CAPS: 100 mg | ORAL | Qty: 1

## 2018-02-02 MED FILL — METOPROLOL TARTRATE 25 MG PO TABS: 25 mg | ORAL | Qty: 1

## 2018-02-02 MED FILL — LIPITOR 20 MG PO TABS: 20 mg | ORAL | Qty: 1

## 2018-02-02 MED FILL — MEROPENEM 1 G IV SOLR: 1 g | INTRAVENOUS | Qty: 1

## 2018-02-02 NOTE — Progress Notes (Addendum)
Physical Therapy  Facility/Department: [facility]  Daily Treatment Note  NAME: David Watkins  DOB: 07-21-1956  MRN: [id number]    Date of Service: 02/02/2018    Discharge Recommendations:    David Watkins scored a 18/24 on the AM-PAC short mobility form. Current research shows that an AM-PAC score of 18 or greater is typically associated with a discharge to the patient's home setting. Based on the patient's AM-PAC score and their current functional mobility deficits, it is recommended that the patient have 2-3 sessions per week of Physical Therapy at d/c to increase the patient's independence.      HOME HEALTH CARE: LEVEL 2 SOCIAL     - Initial home health evaluation to occur within 24-48 hours, in patient home   - Therapy to evaluate with goal of regaining prior level of functioning   - Therapy to evaluate if patient has Home Health Aide needs for personal care   - Social Worker evaluation within 24-48 hours, includes evaluation of resources and insurance to determine AL/IL/LTC/Medicaid options   - Council on Aging Referral   - Family/POA Care Conference to discuss home support and care needs post discharge within two weeks of discharge.    PT Equipment Recommendations  Equipment Needed: Yes  Mobility Devices: Walker  Walker: Rolling    Assessment   Body structures, Functions, Activity limitations: Decreased functional mobility ;Decreased endurance;Decreased strength;Decreased balance;Increased Pain  Assessment: Pt continues to be limited by BLE weakness and decreased overall endurance. Pt amb 100' +150' with RW and CGA however with 2 instances of RLE buckling requring min/mod A to correct and use of BUE on RW. Pt with impaired gait requiring VC frequent VC for correction. Anticipate good progress with further practice. Will continue to folllow.   Treatment Diagnosis: decreased independence with functional mobility  Prognosis: Good  Patient Education: Pt educated on PT POC, goals for rehab stay, and d/c  recommendations. Pt verbalized understanding.  REQUIRES PT FOLLOW UP: Yes  Activity Tolerance  Activity Tolerance: Patient limited by fatigue;Patient limited by endurance;Patient limited by pain     Patient Diagnosis(es): The primary encounter diagnosis was Atypical pneumonia. Diagnoses of Neutropenia associated with infection (HCC) and Sepsis due to pneumonia [facility]) were also pertinent to this visit.     has a past medical history of Bone pain, Hyperlipidemia, Leukemia, plasma cell, in relapse (HCC), Low back pain, Multiple myeloma (HCC), and Neuropathy.   has a past surgical history that includes bone marrow biopsy; other surgical history (Left, 08/17/2016); bone marrow transplant; and pre-malignant / benign skin lesion excision (Left, 03/29/2017).    Restrictions  Position Activity Restriction  Other position/activity restrictions: Up as tolerated  Subjective   General  Chart Reviewed: Yes  Additional Pertinent Hx: Pt is a 62 yo male who presented to the ER on with complaint of chest pain and shortness of breath. PMH: bone pain, HLD, leukemia, low back pain, multiple myeloma, neuropathy.   Family / Caregiver Present: No  Referring Practitioner: Dr. Loyal Buba  General Comment  Comments: Pt was sitting in the bedside chair upon arrival. Reporting global pain - unrated - RN aware. Pt agreeable to PT intervention          Orientation  Orientation  Overall Orientation Status: Within Functional Limits  Objective      Transfers  Sit to Stand: Contact guard assistance(from recliner, from bench )  Stand to sit: Contact guard assistance(to bench, to recliner. VC for hand placement)  Ambulation  Ambulation?: Yes  Ambulation 1  Surface: level tile  Device: Rolling Walker  Assistance: Minimal assistance;Moderate assistance  Quality of Gait: step through pattern, decreased step length and foot clearance B, fwd flexed posture. RLE ER - corrects briefly with cues - R knee buckling x2 instances requiring Min/Mod Ax1 and RW to correct  balance. Gait improving with distance - stride length and step height  increasing.   Distance: 100' + 150'   Comments: Seated rest break between bouts of amb 2/2 to fatigue and reports of B calf tightness  Stairs/Curb  Stairs?: No        Exercises  Comments: AP, LAQ, seated marches 10 BLE; sit to stand x5 from recliner with RW and CGA - VC and focus on slow and safe eccentric control               Goals  Short term goals  Time Frame for Short term goals: by d/c  Short term goal 1: Pt will transfer supine <> sit independently ongoing  Short term goal 2: Pt will transfer sit <> stand MOD I ongoing  Short term goal 3: Pt will ambulate 200' with RW and MOD I ongoing  Patient Goals   Patient goals : Be able to independently get up and walk around without feeling SOB.    Plan    Plan  Times per week: 2-5  Current Treatment Recommendations: Strengthening, Stair training, Investment banker, operational, Location manager, Teaching laboratory technician, Building services engineer, Teacher, early years/pre, Tour manager Devices  Type of devices: Left in chair, Nurse notified, Call light within reach, Chair alarm in place, Gait belt     Therapy Time   Individual Concurrent Group Co-treatment   Time In 1425         Time Out 1503         Minutes 38           Timed Code Treatment Minutes:  38    Total Treatment Minutes: 38     If the patient is discharged before the next treatment session, this note will serve as the discharge summary.     Domingo Mend, PT, DPT [phone]

## 2018-02-02 NOTE — Progress Notes (Addendum)
Nutrition Assessment    Type and Reason for Visit: Reassess    Nutrition Recommendations:   1. Continue current General diet, low microbial  2. Monitor weight, labs and clinical progress  3. Monitor, record and encourage adequate PO intake at all meals through admission especially protein  4. ONS: Offer Ensure Enlive once daily per pt request, can make into milkshake as desired  5. Encourage good oral care prior to meals to manage taste changes        Nutrition Assessment: Follow-up for po intake and ONS acceptance. Pt is improving from a nutritional standpoint AEB increased appetite over the past 5 days. Pt remains at nutritional risk d/t c/o taste changes (water tastes like aluminium) d/t chemo. RD offered plastic utensils since metallic taste present, pt declined. Encouraged adequate protein intake including ONS. Pt reports he maybe consumed 1-2 Magic Cup a few days ago but none since then. Will modify ONS per pt request (likes ice cream), continue to monitor intake adequacy and nutrition status.     Malnutrition Assessment:  ?? Malnutrition Status: No malnutrition  ?? Context: Chronic illness  ?? Findings of the 6 clinical characteristics of malnutrition (Minimum of 2 out of 6 clinical characteristics is required to make the diagnosis of moderate or severe Protein Calorie Malnutrition based on AND/ASPEN Guidelines):  1. Energy Intake-(Greater than 50% of estimated energy requirement), Greater than or equal to 5 days    2. Weight Loss-No significant weight loss,    3. Fat Loss-No significant subcutaneous fat loss,    4. Muscle Loss-No significant muscle mass loss,    5. Fluid Accumulation-No significant fluid accumulation,    6. Grip Strength-Not measured    Nutrition Risk Level: Moderate    Nutrient Needs:  ?? Estimated Daily Total Kcal: 4166-0630 (22-25)  ?? Estimated Daily Protein (g): 88-115 (1-1.2)  ?? Estimated Daily Total Fluid (ml/day): 1601-0932 (1 ml/kcal)    Nutrition Diagnosis:   ?? Problem: Altered  taste perception  ?? Etiology: related to Other (chemo)    ??? Signs and symptoms:  as evidenced by Patient report of, Other (taste changes)    Objective Information:  ?? Nutrition-Focused Physical Findings: appears obese; non-pitting RUE and LUE edema and +1 pitting RLE and LLE edema; +1.9 L since admit s/p diuretic therapy; last BM x 1 on 6/4  ?? Wound Type: None  ?? Current Nutrition Therapies:  ?? Oral Diet Orders: General, Low Microbial   ?? Oral Diet intake: 51-75%, 76-100%  ?? Oral Nutrition Supplement (ONS) Orders: Frozen Oral Supplement  ?? ONS intake: 0%(per I/O)  ?? Anthropometric Measures:  ?? Ht: 5\' 9"  (175.3 cm)   ?? Current Body Wt: 194 lb (88 kg)  ?? Admission Body Wt: 194 lb (88 kg)  ?? Usual Body Wt: (194# "about usual" per patient)  ?? % Weight Change:  ,  Weight appears stable per epic  ?? Ideal Body Wt: 160 lb 4.4 oz (72.7 kg),   ?? BMI Classification: BMI 25.0 - 29.9 Overweight    Nutrition Interventions:   Continue current diet, Modify current ONS  Continued Inpatient Monitoring    Nutrition Evaluation:   ?? Evaluation: Goals set   ?? Goals: Pt will consume and tolerate at least 50% of meals/supplements offered    ?? Monitoring: Meal Intake, Supplement Intake, Diet Tolerance, Weight, Pertinent Labs      Electronically signed by Campbell Stall, RD, LD on 02/02/18 at 12:22 PM    Contact Number: [phone]

## 2018-02-02 NOTE — Progress Notes (Signed)
Pulmonary Followup Note    CC: COPD exacerbation, pneumonia, neutropenic fever and myeloma  Subjective:  Reports continued improvement with his breathing and chest tightness, however, still having cough and wheezing and appears dyspneic.  Had a fall yesterday and complains of weakness in his thighs.    ROS:  Denies headache, nausea or chest pain.    24HR INTAKE/OUTPUT:      Intake/Output Summary (Last 24 hours) at 02/02/2018 1836  Last data filed at 02/02/2018 1805  Gross per 24 hour   Intake 1420 ml   Output 2075 ml   Net -655 ml       ??? metoprolol tartrate  50 mg Oral BID   ??? [START ON 02/03/2018] predniSONE  25 mg Oral BID   ??? amiodarone  200 mg Oral Daily   ??? guaiFENesin  600 mg Oral BID   ??? diltiazem  240 mg Oral Daily   ??? celecoxib  100 mg Oral BID   ??? insulin lispro  0-18 Units Subcutaneous TID WC   ??? insulin lispro  0-9 Units Subcutaneous Nightly   ??? anidulafungin  100 mg Intravenous Q24H   ??? atorvastatin  10 mg Oral Nightly   ??? calcium elemental  500 mg Oral BID   ??? fentaNYL  1 patch Transdermal Q72H   ??? gabapentin  300 mg Oral TID   ??? pantoprazole  40 mg Oral QAM AC   ??? sennosides-docusate sodium  2 tablet Oral Daily   ??? sertraline  50 mg Oral Daily   ??? valACYclovir  500 mg Oral BID   ??? meropenem  1 g Intravenous Q8H   ??? sodium chloride flush  10 mL Intravenous 2 times per day   ??? Saline Mouthwash  15 mL Swish & Spit 4x Daily AC & HS   ??? tiotropium  18 mcg Inhalation Daily   ??? budesonide  0.25 mg Nebulization BID    And   ??? formoterol  20 mcg Nebulization BID   ??? levalbuterol  1.25 mg Nebulization Q4H WA           PHYSICAL EXAMINATION:  BP 101/72    Pulse 64    Temp 97.6 ??F (36.4 ??C) (Oral)    Resp 14    Ht 5' 9" (1.753 m)    Wt 194 lb 14.2 oz (88.4 kg)    SpO2 95%    BMI 28.78 kg/m??   CURRENT PULSE OXIMETRY:  SpO2: 95 %  24HR PULSE OXIMETRY RANGE:  SpO2  Avg: 95.9 %  Min: 95 %  Max: 97 % on ra      Gen: mild distress. Speaking in full sentences with trace accessory muscle  use  HEENT: PERRL, EOMI, OP nl  Lung: inspiratory ronchi and expiratory wheezing throughout  CV: RRR without M/R/R  Abd: +BS, soft, NT/ND  Ext: No edema.    DATA  CBC:   Recent Labs     01/31/18  0400 02/01/18  0357 02/02/18  0345   WBC 3.3* 3.2* 3.4*   HGB 9.2* 8.3* 8.5*   HCT 27.2* 25.0* 25.2*   MCV 93.2 93.3 93.0   PLT 16* 14* 13*     BMP:   Recent Labs     01/31/18  0400 02/01/18  0357 02/02/18  0345   NA 133* 131* 133*   K 4.3 4.5 4.7   CL 96* 96* 98*   CO2 _0 PHOS 2.2* 2.3* 2.3*   BUN 29* 25* 22*  CREATININE <0.5* <0.5* 0.5*     No results for input(s): PHART, PCO2ART, PO2ART in the last 72 hours.  LIVER PROFILE:   Recent Labs     01/31/18  0400 02/02/18  0345   AST 25 17   ALT 59* 53*   BILIDIR <0.2 <0.2   BILITOT 0.3 0.3   ALKPHOS 101 100       CXR REVIEWED BY ME AND SHOWED:  CT Chest WO Contrast   Final Result      Stable innumerable pulmonary nodules bilaterally favoring metastatic disease.      Minimal decrease in size of dominant left axillary lymph node Jan 27, 2018      Stable osteolytic lesions of the axial skeleton, likely metastatic.            CT CHEST W CONTRAST   Final Result   1. Interval development of multiple pulmonary nodules seen scattered throughout both lungs. There is also some nodular areas of patchy consolidation within the left lung and some interstitial infiltrate within the right upper lobe periphery. Findings are    nonspecific but favor an inflammatory etiology such as an atypical pneumonia or bronchiolitis. Interval development of pulmonary metastatic disease is considered less likely.   2. Moderate emphysema.   3. Left axillary lymphadenopathy is mildly decreased in comparison the prior study.   4. Previously seen abnormal paraspinal soft tissue identified along the lower thoracic spine has decreased in size and only some small ill-defined nodular soft tissue remains. Findings are consistent with neoplastic disease which is responding to    therapy.   5. Stable osseous  metastatic disease.      XR CHEST PORTABLE   Final Result      No acute pulmonary pathology or change from the prior study                  CTA PULMONARY W CONTRAST   Final Result   1.  No pulmonary embolism.   2.  Patchy groundglass opacities and tree-in-bud are seen throughout the    lungs which are new compared to prior examination.  This most likely    represents atypical infection/inflammation.  Clinical correlation is    recommended.   3.  Paraseptal and centrilobular emphysematous change of the lungs.   4.  Enlarged left axillary lymph nodes are not significantly changed    compared to PET/CT on 01/10/2018.  This is worrisome for nodal metastasis.   5.  Scattered lucent lesions are seen throughout the ribs, spine, and    scapula.  Findings may represent multiple myeloma or osseous metastasis.          XR CHEST STANDARD (2 VW)   Final Result      No acute cardiopulmonary findings.           ASSESSMENT/PLAN:  This is a 62 y.o. male with Fever, pulmonary nodules and multiple myeloma    Nothing by mouth after midnight for bronchoscopy tomorrow.  Procedure to be done with general anesthesia as patient is on chronic pain medications and says he's difficult to sedate for endoscopy procedures.  Will get a platelet transfusion in the morning to mitigate bleeding risk from the airway.  Plan is to aspirate retained secretions if present and perform a BAL.    Spoke with primary team and with the recent fall and continued wheezing despite high dose steroids, it is time to wean further.  Dropping to prednisone 47m/day  Continue empiric antibiotics and  treatments for aspergillus    Neville Route, MD

## 2018-02-02 NOTE — Progress Notes (Signed)
S: States SOB and cough are unchanged.He had a fall yesterday after using the urinal. He felt weak in the knees and thighs and then fell.  Denies LH/dizziness or syncope. Converted to A fib overnight.      Tele: A fib     O:  Physical Exam:  BP 104/80    Pulse 102    Temp 97.6 ??F (36.4 ??C) (Oral)    Resp 16    Ht 5' 9"  (1.753 m)    Wt 194 lb 14.2 oz (88.4 kg)    SpO2 96%    BMI 28.78 kg/m??    General (appearance):  No acute distress  Eyes: anicteric   Neck: soft, No JVD  Ears/Nose/Mouth/Thorat: No cyanosis  CV: RRR   Respiratory:  + wheeze and rhonchi   GI: soft, non-tender, non-distended  Skin: Warm, dry. No rashes  Neuro/Psych: Alert and oriented x 3. Appropriate behavior  Ext:  No c/c. 2+ pitting edema  Pulses:  2+ radial     I.O's= +2.5 liters    Weight  Admission: Weight: 194 lb 10.7 oz (88.3 kg)   Today: Weight: 194 lb 14.2 oz (88.4 kg)    CBC:   Recent Labs     01/31/18  0400 02/01/18  0357 02/02/18  0345   WBC 3.3* 3.2* 3.4*   HGB 9.2* 8.3* 8.5*   HCT 27.2* 25.0* 25.2*   MCV 93.2 93.3 93.0   PLT 16* 14* 13*     BMP:   Recent Labs     01/31/18  0400 02/01/18  0357 02/02/18  0345   NA 133* 131* 133*   K 4.3 4.5 4.7   CL 96* 96* 98*   CO2 28 26 27    PHOS 2.2* 2.3* 2.3*   BUN 29* 25* 22*   CREATININE <0.5* <0.5* 0.5*     Mag:   Lab Results   Component Value Date    MG 2.00 02/02/2018     LIVER PROFILE:   Recent Labs     01/31/18  0400 02/02/18  0345   AST 25 17   ALT 59* 53*   BILIDIR <0.2 <0.2   BILITOT 0.3 0.3   ALKPHOS 101 100     PT/INR:   Recent Labs     01/31/18  0400   PROTIME 12.8   INR 1.12     APTT:   Recent Labs     01/31/18  0400   APTT 24.3*     Pro-BNP:   Lab Results   Component Value Date    PROBNP 324 01/21/2018       Imaging:    02/01/2018 CT chest:     Stable innumerable pulmonary nodules bilaterally favoring metastatic disease.   ??   Minimal decrease in size of dominant left axillary lymph node Jan 27, 2018   ??   Stable osteolytic lesions of the axial skeleton, likely metastatic.     01/31/2018  ECG: NSR    01/27/2018 CT chest:  1. Interval development of multiple pulmonary nodules seen scattered throughout both lungs. There is also some nodular areas of patchy consolidation within the left lung and some interstitial infiltrate within the right upper lobe periphery. Findings are   ??nonspecific but favor an inflammatory etiology such as an atypical pneumonia or bronchiolitis. Interval development of pulmonary metastatic disease is considered less likely.   2. Moderate emphysema.   3. Left axillary lymphadenopathy is mildly decreased in comparison the prior study.   4.  Previously seen abnormal paraspinal soft tissue identified along the lower thoracic spine has decreased in size and only some small ill-defined nodular soft tissue remains. Findings are consistent with neoplastic disease which is responding to    therapy.   5. Stable osseous metastatic disease.       01/21/2018 CTA PE     1. ??No pulmonary embolism.   2. ??Patchy groundglass opacities and tree-in-bud are seen throughout the    lungs which are new compared to prior examination. ??This most likely    represents atypical infection/inflammation. ??Clinical correlation is    recommended.   3. ??Paraseptal and centrilobular emphysematous change of the lungs.   4. ??Enlarged left axillary lymph nodes are not significantly changed    compared to PET/CT on 01/10/2018. ??This is worrisome for nodal metastasis.   5. ??Scattered lucent lesions are seen throughout the ribs, spine, and    scapula. ??Findings may represent multiple myeloma or osseous metastasis.       01/21/2018 TTE: EF 50-55%. No def RWMA (inferior and inferoseptum was normal). No sig valve regurgitation or stenosis.  ??  01/22/18 ECGs reviewed. Earlier ecg seemed to demonstrate acute inferior MI but Tns negative and further ecgs showed evolution other ST segments suggesting acute pericarditis.  ??  01/21/2018 CTA Chest: No PE. Tree-in-bud bilateral opacities suggesting atypical infection/inflammation.  Enlarged L axillary notes unchanged. Ribs, spine, and scapula lesions suggesting MM/bony mets. No sig pericardial effusion noted.  ??  01/22/18 CXR: Hyperinflated lungs. No definite acute consolidation    Assessment:    62 y.o. admitted with severe SOB.  -Acute pericarditis  -Multiple myeloma  -COPD  -Neutropenia  -Pneumonia with "tree in bud" appearance of CT chest  -Hyperlipidemia  -parox A fib with RVR      Plan:  -Keep K>4, Mg>2.  -Amio, diltiazem,   -Metoprolol increased to 50 mg po bid   -Lipitor  -Bronchoscopy scheduled for tomorrow.     Will discuss with Dr. Gilman Buttner NP

## 2018-02-02 NOTE — Plan of Care (Signed)
Nutrition Problem: Increased nutrient needs  Intervention: Food and/or Nutrient Delivery: Continue current diet, Modify current ONS  Nutritional Goals: Pt will consume and tolerate at least 50% of meals/supplements offered

## 2018-02-02 NOTE — Anesthesia Pre-Procedure Evaluation (Addendum)
Department of Anesthesiology  Preprocedure Note       Name:  David Watkins   Age:  62 y.o.  DOB:  Sep 03, 1955                                          MRN:  [id number]         Date:  02/02/2018      Surgeon: Moishe Spice):  [name], MD    Procedure: BRONCHOSCOPY WITH BAL, CHOICE ANESTHESIA (N/A )    Medications prior to admission:   Prior to Admission medications    Medication Sig Start Date End Date Taking? Authorizing Provider   dexamethasone (DECADRON) 4 MG tablet 8 mg x 1 dose on Tuesday evening, then 4 mg twice daily x 2 days 01/18/18  Yes Emilee Hero, MD   fentaNYL (DURAGESIC) 50 MCG/HR Place 1 patch onto the skin every 72 hours for 30 days. 01/19/18 02/18/18 Yes Emilee Hero, MD   bisacodyl (DULCOLAX) 5 MG EC tablet Take 2 tablets by mouth daily as needed for Constipation 01/18/18  Yes Emilee Hero, MD   sennosides-docusate sodium (SENOKOT-S) 8.6-50 MG tablet Take 2 tablets by mouth daily 01/18/18  Yes Emilee Hero, MD   Fluticasone-Umeclidin-Vilant (TRELEGY ELLIPTA) 100-62.5-25 MCG/INH AEPB Inhale 1 puff into the lungs daily 01/11/18  Yes Historical Provider, MD   lidocaine-prilocaine (EMLA) 2.5-2.5 % cream Apply topically as needed for Pain Apply topically as needed.   Yes Historical Provider, MD   albuterol sulfate HFA (PROAIR HFA) 108 (90 Base) MCG/ACT inhaler Inhale 2 puffs into the lungs every 6 hours as needed for Wheezing   Yes Historical Provider, MD   calcium carbonate (OSCAL) 500 MG TABS tablet Take 500 mg by mouth 2 times daily    Yes Historical Provider, MD   oxyCODONE-acetaminophen (PERCOCET) 7.5-325 MG per tablet Take 1 tablet by mouth every 4 hours as needed for Pain..   Yes Historical Provider, MD   ondansetron (ZOFRAN) 4 MG tablet Take 1 tablet by mouth every 6 hours as needed for Nausea or Vomiting 05/29/17  Yes Terressa Koyanagi, MD   sertraline (ZOLOFT) 50 MG tablet Take 50 mg by mouth daily   Yes Historical Provider, MD   gabapentin (NEURONTIN) 300 MG capsule Take 1 capsule by  mouth 3 times daily for 30 days.. 05/01/17 01/22/18 Yes Lisette Grinder, MD   pantoprazole (PROTONIX) 40 MG tablet Take 1 tablet by mouth every morning (before breakfast) 05/01/17  Yes Lisette Grinder, MD   prochlorperazine (COMPAZINE) 10 MG tablet Take 1 tablet by mouth every 6 hours as needed (Nausea) 05/01/17  Yes Lisette Grinder, MD   atorvastatin (LIPITOR) 10 MG tablet Take 10 mg by mouth nightly    Yes Historical Provider, MD   valACYclovir (VALTREX) 500 MG tablet Take 500 mg by mouth 2 times daily   Yes Historical Provider, MD       Current medications:    Current Facility-Administered Medications   Medication Dose Route Frequency Provider Last Rate Last Dose   . metoprolol tartrate (LOPRESSOR) tablet 50 mg  50 mg Oral BID Bishop Limbo, MD   50 mg at 02/02/18 2112   . [START ON 02/03/2018] predniSONE (DELTASONE) tablet 25 mg  25 mg Oral BID Jody M Moehring, APRN - CNP       . amiodarone (CORDARONE) tablet 200  mg  200 mg Oral Daily Althia Forts, MD   200 mg at 02/02/18 0909   . guaiFENesin [facility]) extended release tablet 600 mg  600 mg Oral BID [name], MD   600 mg at 02/02/18 2112   . acetaminophen (TYLENOL) tablet 650 mg  650 mg Oral PRN Glenna Fellows, APRN - CNP   650 mg at 01/27/18 7829   . diphenhydrAMINE (BENADRYL) tablet 25 mg  25 mg Oral PRN Glenna Fellows, APRN - CNP   25 mg at 01/27/18 5621   . diltiazem (CARDIZEM CD) extended release capsule 240 mg  240 mg Oral Daily Althia Forts, MD   240 mg at 02/02/18 0908   . potassium phosphate 30 mmol in dextrose 5 % 250 mL IVPB  30 mmol Intravenous Daily PRN Glenna Fellows, APRN - CNP   Stopped at 01/28/18 1018   . celecoxib (CELEBREX) capsule 100 mg  100 mg Oral BID Megan M Earhart, APRN - CNP   100 mg at 02/02/18 2112   . insulin lispro (HUMALOG) injection pen 0-18 Units  0-18 Units Subcutaneous TID WC Awilda Metro, APRN - CNP   3 Units at 02/02/18 1613   . insulin lispro (HUMALOG) injection pen 0-9 Units  0-9 Units  Subcutaneous Nightly Awilda Metro, APRN - CNP   5 Units at 02/02/18 2126   . glucose (GLUTOSE) 40 % oral gel 15 g  15 g Oral PRN Awilda Metro, APRN - CNP       . dextrose 50 % solution 12.5 g  12.5 g Intravenous PRN Awilda Metro, APRN - CNP       . glucagon (rDNA) injection 1 mg  1 mg Intramuscular PRN Awilda Metro, APRN - CNP       . dextrose 5 % solution  100 mL/hr Intravenous PRN Awilda Metro, APRN - CNP       . potassium chloride 20 mEq/50 mL IVPB (Central Line)  20 mEq Intravenous PRN Awilda Metro, APRN - CNP       . magnesium sulfate 2 g in 50 mL IVPB premix  2 g Intravenous PRN Awilda Metro, APRN - CNP   Stopped at 01/29/18 0946   . anidulafungin (ERAXIS) 100 mg in dextrose 5 % 130 mL IVPB  100 mg Intravenous Q24H Awilda Metro, APRN - CNP   Stopped at 02/02/18 1325   . 0.9 % sodium chloride infusion   Intravenous Continuous Awilda Metro, APRN - CNP 20 mL/hr at 02/01/18 1903     . LORazepam (ATIVAN) injection 1 mg  1 mg Intravenous Q6H PRN Darlyn Read, MD   1 mg at 01/25/18 0408   . albuterol (PROVENTIL) nebulizer solution 2.5 mg  2.5 mg Nebulization Q4H PRN Emilee Hero, MD   2.5 mg at 02/01/18 1951   . atorvastatin (LIPITOR) tablet 10 mg  10 mg Oral Nightly Lisabeth Devoid., DO   10 mg at 02/02/18 2112   . bisacodyl (DULCOLAX) EC tablet 10 mg  10 mg Oral Daily PRN Lisabeth Devoid., DO       . calcium elemental (OSCAL) tablet 500 mg  500 mg Oral BID Lisabeth Devoid., DO   500 mg at 02/02/18 2112   . fentaNYL (DURAGESIC) 50 MCG/HR 1 patch  1 patch Transdermal Q72H Lisabeth Devoid., DO   1 patch at 01/31/18 2129   . gabapentin (  NEURONTIN) capsule 300 mg  300 mg Oral TID Lisabeth Devoid., DO   300 mg at 02/02/18 2112   . ondansetron (ZOFRAN) tablet 8 mg  8 mg Oral Q6H PRN Lisabeth Devoid., DO   8 mg at 01/29/18 2039   . oxyCODONE-acetaminophen (PERCOCET) 7.5-325 MG per tablet 1 tablet  1 tablet Oral Q4H PRN Lisabeth Devoid., DO   1 tablet at 02/02/18 2119   .  pantoprazole (PROTONIX) tablet 40 mg  40 mg Oral QAM AC Lisabeth Devoid., DO   Stopped at 02/02/18 [id number]   . prochlorperazine (COMPAZINE) tablet 10 mg  10 mg Oral Q6H PRN Lisabeth Devoid., DO       . sennosides-docusate sodium (SENOKOT-S) 8.6-50 MG tablet 2 tablet  2 tablet Oral Daily Lisabeth Devoid., DO   2 tablet at 02/02/18 0908   . sertraline (ZOLOFT) tablet 50 mg  50 mg Oral Daily Lisabeth Devoid., DO   50 mg at 02/02/18 0908   . valACYclovir (VALTREX) tablet 500 mg  500 mg Oral BID Lisabeth Devoid., DO   500 mg at 02/02/18 2112   . meropenem (MERREM) 1 g in sodium chloride 0.9 % 100 mL IVPB (mini-bag)  1 g Intravenous Q8H Lisabeth Devoid., DO   Stopped at 02/02/18 1818   . sodium chloride flush 0.9 % injection 10 mL  10 mL Intravenous 2 times per day Lisabeth Devoid., DO   10 mL at 02/02/18 2113   . sodium chloride flush 0.9 % injection 10 mL  10 mL Intravenous PRN Lisabeth Devoid., DO       . potassium chloride 20 mEq/50 mL IVPB (Central Line)  20 mEq Intravenous PRN Lisabeth Devoid., DO       . magnesium sulfate 4 g in 100 mL IVPB premix  4 g Intravenous PRN Lisabeth Devoid., DO       . magnesium hydroxide (MILK OF MAGNESIA) 400 MG/5ML suspension 30 mL  30 mL Oral Daily PRN Lisabeth Devoid., DO       . Saline Mouthwash 15 mL  15 mL Swish & Spit 4x Daily AC & HS Lisabeth Devoid., DO   15 mL at 02/02/18 1616   . Saline Mouthwash 15 mL  15 mL Swish & Spit Q4H PRN Lisabeth Devoid., DO       . alteplase (CATHFLO) injection 2 mg  2 mg Intracatheter PRN Lisabeth Devoid., DO       . tiotropium [facility]) inhalation capsule 18 mcg  18 mcg Inhalation Daily Lisabeth Devoid., DO   18 mcg at 02/02/18 0845   . budesonide (PULMICORT) nebulizer suspension 250 mcg  0.25 mg Nebulization BID Lisabeth Devoid., DO   250 mcg at 02/02/18 0845    And   . formoterol (PERFOROMIST) nebulizer solution 20 mcg  20 mcg Nebulization BID Lisabeth Devoid., DO   20 mcg at 02/02/18 0844   . LORazepam (ATIVAN)  tablet 1 mg  1 mg Oral Q4H PRN Lisabeth Devoid., DO   1 mg at 02/02/18 2119   . levalbuterol (XOPENEX) nebulizer solution 1.25 mg  1.25 mg Nebulization Q4H [place] Lisabeth Devoid., DO   1.25 mg at 02/02/18 1334       Allergies:    Allergies   Allergen Reactions   .  Lenalidomide Rash   . Pomalidomide Hives   . Ceclor [Cefaclor] Hives       Problem List:    Patient Active Problem List   Diagnosis Code   . Multiple myeloma not having achieved remission (HCC) C90.00   . 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 Multiple myeloma in remission (HCC) C90.01   . Moderate malnutrition (HCC) E44.0   . Open wound of left ear S01.302A   . Ear lesion H93.90   . Skin lesion L98.9   . 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 COPD exacerbation (HCC) J44.1   . Pulmonary nodule R91.1   . Pain R52   . Pneumonia J18.9   . Acute respiratory failure with hypoxia and hypercapnia (HCC) J96.01, J96.02   . Neutropenia associated with infection (HCC) D70.3   . Paroxysmal atrial fibrillation (HCC) I48.0   . Mixed hyperlipidemia E78.2   . Acute pericarditis I30.9   . Atypical pneumonia J18.9       Past Medical History:        Diagnosis Date   . Bone pain    . Hyperlipidemia    . Leukemia, plasma cell, in relapse (HCC)    . Low back pain    . Multiple myeloma (HCC)    . Neuropathy     chemo induced, feet       Past Surgical History:        Procedure Laterality Date   . BONE MARROW BIOPSY     . BONE MARROW TRANSPLANT     . OTHER SURGICAL HISTORY Left 08/17/2016    trifusion cath placement   . PRE-MALIGNANT / BENIGN SKIN LESION EXCISION Left 03/29/2017    EXCISE LESION LEFT EXTERNAL EAR WITH FROZEN SECTION, FULL THICKNESS SKIN GRAFT       Social History:    Social History     Tobacco Use   . Smoking status: Former Smoker     Last attempt to quit: 11/25/2012     Years since quitting: 5.1   . Smokeless tobacco: Never Used   Substance Use Topics   . Alcohol use: Yes      Comment: socially                                Counseling given: Not Answered      Vital Signs (Current):   Vitals:    02/02/18 1205 02/02/18 1357 02/02/18 1602 02/02/18 2103   BP:   101/72 123/70   Pulse:   64 76   Resp:  14  16   Temp:   97.6 F (36.4 C) 98.4 F (36.9 C)   TempSrc:   Oral Oral   SpO2:  96% 95% 97%   Weight:       Height: 5\' 9"  (1.753 m)                                                 BP Readings from Last 3 Encounters:   02/02/18 123/70   01/18/18 121/75   01/10/18 122/70       NPO Status:  BMI:   Wt Readings from Last 3 Encounters:   02/02/18 194 lb 14.2 oz (88.4 kg)   01/18/18 194 lb 9.6 oz (88.3 kg)   01/10/18 194 lb 12.8 oz (88.4 kg)     Body mass index is 28.78 kg/m.    CBC:   Lab Results   Component Value Date    WBC 3.4 02/02/2018    RBC 2.70 02/02/2018    RBC 3.30 04/24/2016    HGB 8.5 02/02/2018    HCT 25.2 02/02/2018    MCV 93.0 02/02/2018    RDW 17.9 02/02/2018    PLT 13 02/02/2018       CMP:   Lab Results   Component Value Date    NA 133 02/02/2018    K 4.7 02/02/2018    K 4.8 01/12/2018    CL 98 02/02/2018    CO2 27 02/02/2018    BUN 22 02/02/2018    CREATININE 0.5 02/02/2018    GFRAA >60 02/02/2018    AGRATIO 1.0 01/22/2018    LABGLOM >60 02/02/2018    GLUCOSE 234 02/02/2018    GLUCOSE 110 04/24/2016    PROT 5.0 02/02/2018    PROT 6.6 04/24/2016    CALCIUM 8.2 02/02/2018    BILITOT 0.3 02/02/2018    ALKPHOS 100 02/02/2018    AST 17 02/02/2018    ALT 53 02/02/2018       POC Tests:   Recent Labs     02/02/18  1612   POCGLU 160*       Coags:   Lab Results   Component Value Date    PROTIME 12.8 01/31/2018    INR 1.12 01/31/2018    APTT 24.3 01/31/2018       HCG (If Applicable): No results found for: PREGTESTUR, PREGSERUM, HCG, HCGQUANT     ABGs:   Lab Results   Component Value Date    PHART 7.403 01/22/2018    PO2ART 103.5 01/22/2018    PCO2ART 36.8 01/22/2018    HCO3ART 22.9 01/22/2018    BEART -2  01/22/2018    O2SATART 98 01/22/2018        Type & Screen (If Applicable):  No results found for: LABABO, LABRH    Anesthesia Evaluation  Nursing notes reviewed  Airway: Mallampati: II  TM distance: >3 FB   Neck ROM: full  Mouth opening: > = 3 FB Dental:          Pulmonary:Negative Pulmonary ROS   (+) COPD:                             Cardiovascular:    (+) dysrhythmias: atrial fibrillation, hyperlipidemia                  Neuro/Psych:   Negative Neuro/Psych ROS              GI/Hepatic/Renal: Neg GI/Hepatic/Renal ROS            Endo/Other: Negative Endo/Other ROS   (+) blood dyscrasia (platelets 30): thrombocytopenia:., malignancy/cancer (multiple myeloma).                 Abdominal:           Vascular:                                        Anesthesia Plan  general     ASA 4       Induction: intravenous.      Anesthetic plan and risks discussed with patient.      Plan discussed with CRNA.    Attending anesthesiologist reviewed and agrees with Pre Eval content          Will need platelets to be increased prior to surgery    Truddie Coco, MD   02/02/2018

## 2018-02-02 NOTE — Care Coordination-Inpatient (Signed)
Type of Admission  Multiple Myeloma/ Plasma Cell Leukemia  C1 D20 DCEP  Admitted with SOB        Central venous catheter  Right Single Lumen Port ( 01/12/18)        Plan          Update  01/25/18:n  Admitted through the Emergency Room with acute shortness of breath. Now being treated with acute pericarditis.  States he is feeling better.  Out of bed to chair, audible wheezing.  Currently of a Amiofdarone infusion.  01/26/18: Walked to bathroom & became very dyspneic & congested.  Assisted back to bed with the assist of 2 nurses.  Medicated with IV Morphine.  01/31/18:  Positive culture for galactomanan & aspergillus.  Currently on room air.  For  Possible bronchoscopy mid-week.  02/02/18:  Currently on room air for bronchoscopy tomorrow. Continue prednisone taper.        Education  02/01/18:  Discussed Cresemba as fungal coverage, assisted Lyden with for for [facility] Support      Discharge  To be determined        P[place]in  01/31/18:  [employer] Form Completed--> Re-sent to [employer]

## 2018-02-02 NOTE — Plan of Care (Signed)
Problem: Falls - Risk of:  Goal: Will remain free from falls  Description  Will remain free from falls  Outcome: Ongoing  Note:   Pt is a High fall risk. See Leamon Arnt Fall Score and ABCDS Injury Risk assessments.   Explained fall risk precautions to pt and family and rationale behind their use to keep the patient safe. Pt bed is in low position, side rails up, call light and belongings are in reach. Fall wristband applied and present on pts wrist.  Bed alarm on.  Pt encouraged to call for assistance. Will continue with hourly rounds for PO intake, pain needs, toileting and repositioning as needed.          Problem: Pain:  Goal: Pain level will decrease  Description  Pain level will decrease  Outcome: Ongoing  Note:   Fay continues to c/o chronic lower back pain- pain medication given with good result- see eMAR.      Problem: Bleeding:  Goal: Will show no signs and symptoms of excessive bleeding  Description  Will show no signs and symptoms of excessive bleeding  Outcome: Ongoing  Note:   Patient's hemoglobin this AM:   Recent Labs     02/02/18  0345   HGB 8.5*     Patient's platelet count this AM:   Recent Labs     02/02/18  0345   PLT 13*    Thrombocytopenia Precautions in place.  Patient showing no signs or symptoms of active bleeding.  Transfusion not indicated at this time.  Patient verbalizes understanding of all instructions. Will continue to assess and implement POC. Call light within reach and hourly rounding in place.         Problem: PROTECTIVE PRECAUTIONS  Goal: Patient will remain free of nosocomial Infections  Outcome: Ongoing  Note:   Pt remains in protective precautions.  Pt educated on wearing mask when in hallways. Pt, staff, and visitors adhering to handwashing guidelines. Pt educated to shower or bathe daily with chlorhexidine and linens changed daily per protocol. Pt verbalizes understanding of low microbial diet. Will continue to monitor.

## 2018-02-02 NOTE — Progress Notes (Signed)
[facility] Progress Note    02/02/2018     ARIAS WEINERT    MRN: [id number]    DOB: [birth year]    SUBJECTIVE:  Sleeping, but arousable; bronch scheduled for tomorrow    ECOG PS:  (2) Ambulatory and capable of self care, unable to carry out work activity, up and about > 50% or waking hours    Isolation: None    Medications    Scheduled Meds:  ??? metoprolol tartrate  50 mg Oral BID   ??? amiodarone  200 mg Oral Daily   ??? guaiFENesin  600 mg Oral BID   ??? diltiazem  240 mg Oral Daily   ??? celecoxib  100 mg Oral BID   ??? methylPREDNISolone  40 mg Intravenous Q12H   ??? insulin lispro  0-18 Units Subcutaneous TID WC   ??? insulin lispro  0-9 Units Subcutaneous Nightly   ??? anidulafungin  100 mg Intravenous Q24H   ??? atorvastatin  10 mg Oral Nightly   ??? calcium elemental  500 mg Oral BID   ??? fentaNYL  1 patch Transdermal Q72H   ??? gabapentin  300 mg Oral TID   ??? pantoprazole  40 mg Oral QAM AC   ??? sennosides-docusate sodium  2 tablet Oral Daily   ??? sertraline  50 mg Oral Daily   ??? valACYclovir  500 mg Oral BID   ??? meropenem  1 g Intravenous Q8H   ??? sodium chloride flush  10 mL Intravenous 2 times per day   ??? Saline Mouthwash  15 mL Swish & Spit 4x Daily AC & HS   ??? tiotropium  18 mcg Inhalation Daily   ??? budesonide  0.25 mg Nebulization BID    And   ??? formoterol  20 mcg Nebulization BID   ??? levalbuterol  1.25 mg Nebulization Q4H WA     Continuous Infusions:  ??? dextrose     ??? sodium chloride 20 mL/hr at 02/01/18 1903     PRN Meds:.acetaminophen, diphenhydrAMINE, potassium phosphate IVPB, glucose, dextrose, glucagon (rDNA), dextrose, potassium chloride, magnesium sulfate, LORazepam, albuterol, bisacodyl, ondansetron, oxyCODONE-acetaminophen, prochlorperazine, sodium chloride flush, potassium chloride, magnesium sulfate, magnesium hydroxide, Saline Mouthwash, alteplase, LORazepam    ROS:  As noted above, otherwise remainder of 10-point ROS negative    Physical Exam:     I&O:      Intake/Output Summary (Last 24 hours) at 02/02/2018  1118  Last data filed at 02/02/2018 0800  Gross per 24 hour   Intake 620 ml   Output 1275 ml   Net -655 ml       Vital Signs:  BP 123/84    Pulse 108    Temp 97.6 ??F (36.4 ??C) (Oral)    Resp 14    Ht 5' 9"  (1.753 m)    Wt 194 lb 14.2 oz (88.4 kg)    SpO2 97%    BMI 28.78 kg/m??     Weight:    Wt Readings from Last 3 Encounters:   02/02/18 194 lb 14.2 oz (88.4 kg)   01/18/18 194 lb 9.6 oz (88.3 kg)   01/10/18 194 lb 12.8 oz (88.4 kg)         ??  General: Awake, alert and oriented.  HEENT:??normocephalic, alopecia,??PERRL, no scleral erythema or icterus, Oral mucosa moist and intact  LYMPH: ??Left axillary lymphadenopathy??  NECK: supple without palpable adenopathy  BACK: Straight negative CVAT  SKIN:??warm dry and intact without lesions rashes or masses  CHEST:??rhonchi w/ insp/exp wheeze,??without use  of accessory muscles  CV: Normal S1 S2, irregular, no MRG  ABD:??NT ND normoactive BS, no palpable masses or hepatosplenomegaly  EXTREMITIES:??without edema, denies calf tenderness  NEURO: CN II - XII grossly intact  CATHETER:??Right IJ PAC (06/21/17, [facility])??- CDI        Data    CBC:   Recent Labs     01/31/18  0400 02/01/18  0357 02/02/18  0345   WBC 3.3* 3.2* 3.4*   HGB 9.2* 8.3* 8.5*   HCT 27.2* 25.0* 25.2*   MCV 93.2 93.3 93.0   PLT 16* 14* 13*     BMP/Mag:  Recent Labs     01/31/18  0400 02/01/18  0357 02/02/18  0345   NA 133* 131* 133*   K 4.3 4.5 4.7   CL 96* 96* 98*   CO2 28 26 27    PHOS 2.2* 2.3* 2.3*   BUN 29* 25* 22*   CREATININE <0.5* <0.5* 0.5*   MG 1.80 2.00 2.00     LIVP:   Recent Labs     01/31/18  0400 02/02/18  0345   AST 25 17   ALT 59* 53*   BILIDIR <0.2 <0.2   BILITOT 0.3 0.3   ALKPHOS 101 100     Coags:   Recent Labs     01/31/18  0400   PROTIME 12.8   INR 1.12   APTT 24.3*     Uric Acid   Recent Labs     01/31/18  0400 02/02/18  0345   LABURIC 1.9* 1.8*       Diagnostics:  1. ??PET scan (01/10/18):    ??    2.  CTA Chest (525/19):  1. ??No pulmonary embolism.  2. ??Patchy groundglass opacities and tree-in-bud  are seen throughout the   lungs which are new compared to prior examination. ??This most likely   represents atypical infection/inflammation. ??Clinical correlation is   recommended.  3. ??Paraseptal and centrilobular emphysematous change of the lungs.  4. ??Enlarged left axillary lymph nodes are not significantly changed   compared to PET/CT on 01/10/2018. ??This is worrisome for nodal metastasis.  5. ??Scattered lucent lesions are seen throughout the ribs, spine, and   scapula. ??Findings may represent multiple myeloma or osseous metastasis.    3.  Echocardiogram (01/23/18):  ??Technically difficult examination.  ??Normal left ventricle size, wall thickness, and systolic function with an  ??estimated ejection fraction of 50-55%. No obvious regional wall motion  ??abnormalities are seen (inferior and inferoseptal walls do appear normal, as  ??clinically questioned). Diastolic filling parameters suggests normal  ??diastolic function.  ??Aortic valve appears sclerotic but opens adequately.  ??Possible small/organized pericardial effusion though fat pad cannot be ruled  ??out.    4.  CT Chest (01/31/18):  Stable innumerable pulmonary nodules bilaterally favoring metastatic disease.    Minimal decrease in size of dominant left axillary lymph node Jan 27, 2018    Stable osteolytic lesions of the axial skeleton, likely metastatic.    Myeloma Labs (01/03/18):  SPEP:????reveals that M2, previously characterized as monoclonal IgG kappa in  mid-to-slow gamma is 1.0 gm/dL, greater than the 0.2 gm/dL observed on 08 Nov 2017.??  SIFE:????Serum immunofixation electrophoresis reveals persistent M2 in mid-to-slow gamma, a monoclonal IgG kappa. ??A recent M-spike, M1, was minor monoclonal IgG lambda in mid-gamma; M1 continues to be absent.  SFLC:  Kappa:  30.80, previously (11/08/17) -??10.40  Lambda:  1.46, previously (11/08/17) -??5.97  Free Kappa Lambda Ratio:  21.10, previously (  11/08/17) -??1.74  Quantitative Immunoglobulins:  IgG:??  1480, previously  (11/08/17) - 552  IgA:??  <26, previously (11/08/17) -??30  IgM:??  <20    Myeloma Labs (01/27/18):  Serum Light Chains: Pending    SPE/IFE:??M-protein persists in the gamma region on serum protein electrophoresis. The amount of this protein is 0.5 gm/dL     Quantitative Immunoglobulins:  IgG:??  791  IgA:??  17  IgM:??  11  ??  PROBLEM LIST: ??????????   ????  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 ??COPD   9.????Influenza A??(10/12/17)  10.  COPD Exacerbation (12/2017)  11.  Pericarditis (12/2017)  12.  A. Fib w/ RVR (12/2017)  ????  TREATMENT: ??????????   ??  1. Rad Tx to T5-7, T10-L3, Right Scapula - 3000 cGy - Dr. Jamas Lav 03/20/16-04/01/16  2. RVD x1 03/20/16 - discontinued d/t rash   3. Velcade/Pomalyst/Dex x3 cycles 04/23/16-06/17/16 - discontinued d/t reaction to pomalyst  4. Velcade/Dex x 1 cycle 06/26/16 (last dose of dexamethasone 07/22/16  5. High-dose melphalan followed by administration of PBSCs 2.36 x10^6 cd34cells/kg on 08/28/16  6. Maintenance Revlimid 20m daily (12/2016-04/23/17)  7. Dexamethasone 455mdaily (04/24/17)  8. DCEP   Cycle #1 - 04/26/17  Cycle #2 - 05/25/17 - excellent response  9.??Dara/Velcade/Dex (C1D1 - 06/22/17) - BMBx ??10/18/17 - CR  Cycles 7-8 (21 day cycle)  - Dex 20 mg po D4,5.8,9,11 and 12  - Dara 16 mg/Kg D1 only with Dex 20 mg IV  - Velcade 1.3 mg/M2 D1,4,8 and 11  Cycle 9 and beyond (maintenance, 11/08/17??- 01/03/18)  Dara 16 mg/Kg Q 28 days  Dex 12 mg IV D1  Velcade Q 2 weeks??  10. DCEP   Cycle #1 - 01/13/18  ??  ASSESSMENT AND PLAN:??????????   ??  1. ??IgG kappa multiple myeloma / Plasma Cell Leukemia:??Relapsed disease??  - S/p??treatment??Dara/Velcade/Dex??x 8 cycles??(06/22/17 - 10/2017). Followed by??maintenance Dara??Q4 wks/Velcade??Q2ks/Dex??(started 11/08/17)  - Now w/ progressive disease based on myeloma labs  (01/03/18) & PET scan (01/10/18), see above??  ??  PLAN:??DCEP for disease and pain control. Hope for allogeneic transplant in CR2, but needs to disease response and improvement in lung function??    - M-spike (01/27/18) - 0.5 g/dL, previously 1.0 g/dL  ??  DCEP Cycle #1, Day + 22    ??  2. ??ID: Afebrile w/ possible bacterial and fungal multifocal PNA  - Fungitell and galactomannan (01/25/18) - Positive   - Cont Valtrex ppx  - Cont Meropenem Day + 13  - Eraxis Day + 10    3. Heme:??Chemotherapy induced pancytopenia   - Transfuse for Hgb < 7 and Platelets <??10K  - S/p Neulasta (01/19/18)  ??  4.??Metabolic: Stable renal fxn and e-lytes except for steroid-induced hyperglycemia, hypoNa and hypoPhos  - Cont high regimen Lispro SSI, AC&HS  - S/p Lasix 40 mg IV daily (01/25/18 - 01/30/18)   - Cont IVF:  NS @ 20 mL/hr  - Keep Mg > 2 and K+ > 4.0 and Phos > 2.0    5. Pulmonary:??Acute respiratory distress with mild hypercapnia and hypoxemia from COPD exacerbation and underlying PNA has resolved.  He also is being treated for bacterial and fungal PNA which was likely contributing to acute respiratory failure   - Pulm Nodule: Stable??on??CT chest from??04/23/17??&??10/18/17 &??11/26/17; cont to monitor.   - CTPA??(11/26/17): No PE;??resolution  of??mild upper lobe tree-in-bud opacities; Mild emphysema. Two 3 mm LLL pulmonary nodules, stable  - PFT (12/27/17):????compared to??07/2016:??FVC 3.25 L, 66%, and FEV1  1.42 L, 38%. FEV1/FVC is 44%. This is unchanged from prior study. Air trapping seen on lung volumes. Diffusion capacity 3.91, 79% of predicted, down from 5.21??  - CTPA (01/22/18):  No PE, patchy groundglass opacities and tree-in-bud are seen throughout the  lungs which are new compared to prior examination.   - CT chest (01/31/18): Stable innumerable pulmonary nodules bilaterally favoring metastatic disease.  - Pulm following, appreciate recs. Cont regular f/u w/ Dr. Dorann Lodge on d/c  - Cont home inhalers    - Cont Xopenox q4hrs  - Cont Steroid taper:  Solumedrol 80 mg TID (started 01/22/18), 40 mg TID (01/25/18), 40 mg bid (01/26/18), decrease to Pred 25 mg PO BID on 02/03/18  - Dyspnea improved, but still w/ markedly abnormal lung exam. CT chest (01/31/18) cont to show pulm nodules  - Plan for bronch on 02/03/18 @ 1130    ??  6. GI/Nutrition: Appetite and oral intake is good??  - Cont PPI ppx w/ steroids??  - Cont low microbial diet ??  Constipation: ??Improved  - Cont Dulcolax prn & Senokot-s 2 tabs daily    ??  7. Cardiac:??Ongoing A fib with RVR  - H/o HLD &??has septal wall defect on echocardiogram.  - Echo (01/23/18):  LVEF 50-55# w/ normal diastolic fxn & no obvious regional wall motion abnormalities  - Cardiology following, appreciate recs  - Cont Lipitor??  - S/p Dilt gtt (stopped 01/27/18) & Amio gtt (stopped 01/31/18), cont Diltiazem CD 240 mg daily, Amiodarone 200 mg daily & Lopressor 25 mg bid  - Cont steroids for COPD exacerbation & pericarditis  - Cont Celebrex (started 01/26/18)  ??  8. Psych:  Ongoing concerns about disease progression and ability to get transplant   Anxiety/Depression: Ongoing  - Cont Zoloft??50 mg daily??  - Psych to follow   Insomnia:??No complaints??currently??  ??  9. Peripheral Neuropathy:??Ongoing  - Cont Gabapentin 300 mg??TID  ??  10. 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  11.??Acute on??Chronic left lower back pain d/t Neoplasm:????Improved w/ chemotherapy  - S/p Radiation (09/01/17 - 09/15/17,??Fried)  - Cont Fentanyl patch??50 mcg/hr (increased 01/13/18, Rx 01/18/18)  - Cont Percocet 7.5/325 mg q4hrs prn    12.  Steroid induced myopathy:  -Fall on 02/01/18  -Continue PT/OT    - DVT Prophylaxis: Platelets <50,000 cells/dL - prophylactic lovenox on hold and mechanical prophylaxis with bilateral SCDs while in bed in place.  Contraindications to pharmacologic prophylaxis:  Thrombocytopenia  Contraindications to mechanical prophylaxis: None    - Disposition: Once breathing and chest pain improves       Jody M Moehring, APRN - CNP       Merlyn Albert. Drucilla Schmidt, DO, MS  [facility]    Please contact via:  1.  Perfect Serve  2.  Cell Phone:  [phone]    02/02/2018   11:18 AM

## 2018-02-02 NOTE — Progress Notes (Signed)
Occupational Therapy  Facility/Department: [facility]  Daily Treatment Note  NAME: David Watkins  DOB: November 28, 1955  MRN: [id number]    Date of Service: 02/02/2018    Discharge Recommendations:    David Watkins scored a 20/24 on the AM-PAC ADL Inpatient form. Current research shows that an AM-PAC score of 18 or greater is typically associated with a discharge to the patient's home setting. Based on the patient???s AM-PAC score and their current ADL deficits, it is recommended that the patient have 2-3 sessions per week of Occupational Therapy at d/c to increase the patient???s independence.    OT Equipment Recommendations  Equipment Needed: Yes  Mobility Devices: ADL Assistive Devices  ADL Assistive Devices: Shower Chair with back;Toileting - Raised Toilet Seat with arms    Assessment   Performance deficits / Impairments: Decreased functional mobility ;Decreased ADL status;Decreased endurance  Assessment: Pt is pleasant and ccoperative.  Pt had a fall yesterday when standing to use urinal.  Pt is to have bronchoscopy tomorrow.  Cont OT tx per plan of care.  Treatment Diagnosis: Impaired ADL, functional activity tolerance, and functional mobility  Prognosis: Good  Patient Education: Role of OT:  Pt verbalized understanding  REQUIRES OT FOLLOW UP: Yes  Activity Tolerance  Activity Tolerance: Patient Tolerated treatment well  Safety Devices  Safety Devices in place: Yes  Type of devices: Left in chair;Call light within reach;Chair alarm in place;Nurse notified(Resp therapist present)     Restrictions  Position Activity Restriction  Other position/activity restrictions: Up as tolerated  Subjective   General  Chart Reviewed: Yes  Additional Pertinent Hx: Pt admitted 01/22/18 with shortness of breath.             PMH includes: Multiple myeloma, Leukemia, low back pain  Family / Caregiver Present: No  Referring Practitioner: Dr. Gerrit Halls  Diagnosis: Pneumonia  Subjective  Subjective: Pt in bed upon entry.   Pt requested  to change his clothes.  Vital Signs  Patient Currently in Pain: Yes(6/10, nurse aware)   Objective    ADL  Grooming: Contact guard assistance(to SBA wahing hands and brushing teeth standing at sink)  UE Dressing: Independent  LE Dressing: Contact guard assistance(to don/doff pants)  Toileting: (denied need)        Balance  Sitting Balance: Independent  Standing Balance: Contact guard assistance(to SBA)  Standing Balance  Time: 5 min  Activity: bathroom mobility/activity  Functional Mobility  Functional - Mobility Device: Rolling Walker  Activity: To/from bathroom  Assist Level: Advertising account executive - Technique: Ambulating  Equipment Used: Standard toilet(grab bar)  Toilet Transfer: Contact guard assistance  Bed mobility  Supine to Sit: Stand by assistance  Transfers  Stand Step Transfers: Contact guard assistance  Sit to stand: Contact guard assistance  Stand to sit: Contact guard assistance                       Cognition  Overall Cognitive Status: WNL                                         Plan   Plan  Times per week: 2-5  Times per day: Daily  Current Treatment Recommendations: Therapist, nutritional, Endurance Training, Self-Care / ADL     If patient is discharged prior to next treatment, this note will serve as  the discharge.    AM-PAC Score        AM-PAC Inpatient Daily Activity Raw Score: 20 (01/31/18 1457)  AM-PAC Inpatient ADL T-Scale Score : 42.03 (01/31/18 1457)  ADL Inpatient CMS 0-100% Score: 38.32 (01/31/18 1457)  ADL Inpatient CMS G-Code Modifier : CJ (01/31/18 1457)    Goals                           No goals met  Short term goals  Time Frame for Short term goals: Discharge  Short term goal 1: Transfer to/from toilet with SBA  Short term goal 2: Stance with SBA x6 min while engaging in ADL/functional mobility  Short term goal 3: lower body dressing with CG and assistive devices as needed  Patient Goals   Patient goals : Go home.   Be able to get up and walk on his  own.       Therapy Time   Individual Concurrent Group Co-treatment   Time In 1329         Time Out 1357         Minutes 28         Timed Code Treatment Minutes: 28 Minutes    Total Treatment [place], OTR/L [phone]

## 2018-02-02 NOTE — Progress Notes (Addendum)
Back in a fib.    PE:  Blood pressure 100/84, pulse 94, temperature 97.6 ??F (36.4 ??C), temperature source Oral, resp. rate 14, height 5' 9"  (1.753 m), weight 194 lb 14.2 oz (88.4 kg), SpO2 97 %.  General (appearance):  Alert and cooperative.   Eyes: Anicteric  Neck: Supple. No JVD  Ears/Nose/Mouth/Thorat: No cyanosis  CV: Irreg irreg. No obvious m/r/g  Respiratory:  + mild end-expiratory wheezing  GI: Abd s/nt/nd. No peritoneal signs  Skin: Warm, dry. No rashes  Neuro/Psych: Alert and oriented x 3. Appropriate behavior  Ext:  No c/c. Mild edema  Pulses:  2+ radial    Lab Results   Component Value Date    WBC 3.4 (L) 02/02/2018    HGB 8.5 (L) 02/02/2018    HCT 25.2 (L) 02/02/2018    MCV 93.0 02/02/2018    PLT 13 (LL) 02/02/2018     Lab Results   Component Value Date    NA 133 (L) 02/02/2018    K 4.7 02/02/2018    CL 98 (L) 02/02/2018    CO2 27 02/02/2018    BUN 22 (H) 02/02/2018    CREATININE 0.5 (L) 02/02/2018    GLUCOSE 234 (H) 02/02/2018    CALCIUM 8.2 (L) 02/02/2018    PROT 5.0 (L) 02/02/2018    LABALBU 2.8 (L) 02/02/2018    BILITOT 0.3 02/02/2018    ALKPHOS 100 02/02/2018    AST 17 02/02/2018    ALT 53 (H) 02/02/2018    LABGLOM >60 02/02/2018    GFRAA >60 02/02/2018    AGRATIO 1.0 (L) 01/22/2018    GLOB 3.1 01/22/2018     Lab Results   Component Value Date    INR 1.12 01/31/2018    INR 1.11 01/27/2018    INR 1.18 (H) 01/25/2018    PROTIME 12.8 01/31/2018    PROTIME 12.7 01/27/2018    PROTIME 13.5 (H) 01/25/2018     Lab Results   Component Value Date    TROPONINI <0.01 01/22/2018     Lab Results   Component Value Date    ALT 53 (H) 02/02/2018    AST 17 02/02/2018    ALKPHOS 100 02/02/2018    BILITOT 0.3 02/02/2018     Lab Results   Component Value Date    ALT 53 (H) 02/02/2018    AST 17 02/02/2018    ALKPHOS 100 02/02/2018    BILITOT 0.3 02/02/2018       01/21/2018 TTE: EF 50-55%. No def RWMA (inferior and inferoseptum was normal). No sig valve regurgitation or stenosis.    01/22/18 ECGs reviewed. Earlier ecg  seemed to demonstrate acute inferior MI but Tns negative and further ecgs showed evolution other ST segments suggesting acute pericarditis.    01/21/2018 CTA Chest: No PE. Tree-in-bud bilateral opacities suggesting atypical infection/inflammation. Enlarged L axillary notes unchanged. Ribs, spine, and scapula lesions suggesting MM/bony mets. No sig pericardial effusion noted.    01/22/18 CXR: Hyperinflated lungs. No definite acute consolidation      Current Facility-Administered Medications:   ???  metoprolol tartrate (LOPRESSOR) tablet 50 mg, 50 mg, Oral, BID, Glennon Hamilton, MD, 50 mg at 02/02/18 0647  ???  [START ON 02/03/2018] predniSONE (DELTASONE) tablet 25 mg, 25 mg, Oral, BID, Jody M Moehring, APRN - CNP  ???  amiodarone (CORDARONE) tablet 200 mg, 200 mg, Oral, Daily, Krystal Clark, MD, 200 mg at 02/02/18 2440  ???  guaiFENesin (MUCINEX) extended release tablet 600 mg, 600 mg,  Oral, BID, Neville Route, MD, 600 mg at 02/02/18 9485  ???  acetaminophen (TYLENOL) tablet 650 mg, 650 mg, Oral, PRN, Jody M Moehring, APRN - CNP, 650 mg at 01/27/18 4627  ???  diphenhydrAMINE (BENADRYL) tablet 25 mg, 25 mg, Oral, PRN, Jody M Moehring, APRN - CNP, 25 mg at 01/27/18 0350  ???  diltiazem (CARDIZEM CD) extended release capsule 240 mg, 240 mg, Oral, Daily, Krystal Clark, MD, 240 mg at 02/02/18 0908  ???  potassium phosphate 30 mmol in dextrose 5 % 250 mL IVPB, 30 mmol, Intravenous, Daily PRN, Tomi Likens, APRN - CNP, Stopped at 01/28/18 1018  ???  celecoxib (CELEBREX) capsule 100 mg, 100 mg, Oral, BID, Megan M Earhart, APRN - CNP, 100 mg at 02/02/18 0908  ???  methylPREDNISolone sodium (SOLU-MEDROL) injection 40 mg, 40 mg, Intravenous, Q12H, Jody M Moehring, APRN - CNP, 40 mg at 02/02/18 0620  ???  insulin lispro (HUMALOG) injection pen 0-18 Units, 0-18 Units, Subcutaneous, TID WC, Wayland Salinas, APRN - CNP, 3 Units at 02/02/18 0938  ???  insulin lispro (HUMALOG) injection pen 0-9 Units, 0-9 Units, Subcutaneous, Nightly, Wayland Salinas, APRN - CNP, 2 Units at 02/01/18 2045  ???  glucose (GLUTOSE) 40 % oral gel 15 g, 15 g, Oral, PRN, Megan M Earhart, APRN - CNP  ???  dextrose 50 % solution 12.5 g, 12.5 g, Intravenous, PRN, Wayland Salinas, APRN - CNP  ???  glucagon (rDNA) injection 1 mg, 1 mg, Intramuscular, PRN, Megan M Earhart, APRN - CNP  ???  dextrose 5 % solution, 100 mL/hr, Intravenous, PRN, Wayland Salinas, APRN - CNP  ???  potassium chloride 20 mEq/50 mL IVPB (Central Line), 20 mEq, Intravenous, PRN, Megan M Earhart, APRN - CNP  ???  magnesium sulfate 2 g in 50 mL IVPB premix, 2 g, Intravenous, PRN, Wayland Salinas, APRN - CNP, Stopped at 01/29/18 0946  ???  [COMPLETED] anidulafungin (ERAXIS) 200 mg in dextrose 5 % 260 mL IVPB, 200 mg, Intravenous, Once, Stopped at 01/25/18 1510 **AND** anidulafungin (ERAXIS) 100 mg in dextrose 5 % 130 mL IVPB, 100 mg, Intravenous, Q24H, Megan M Earhart, APRN - CNP, Last Rate: 86.7 mL/hr at 02/02/18 1155, 100 mg at 02/02/18 1155  ???  0.9 % sodium chloride infusion, , Intravenous, Continuous, Megan M Earhart, APRN - CNP, Last Rate: 20 mL/hr at 02/01/18 1903  ???  LORazepam (ATIVAN) injection 1 mg, 1 mg, Intravenous, Q6H PRN, Charleen Kirks, MD, 1 mg at 01/25/18 0408  ???  albuterol (PROVENTIL) nebulizer solution 2.5 mg, 2.5 mg, Nebulization, Q4H PRN, Harlene Salts, MD, 2.5 mg at 02/01/18 1951  ???  atorvastatin (LIPITOR) tablet 10 mg, 10 mg, Oral, Nightly, Verne Grain., DO, 10 mg at 02/01/18 2046  ???  bisacodyl (DULCOLAX) EC tablet 10 mg, 10 mg, Oral, Daily PRN, Verne Grain., DO  ???  calcium elemental (OSCAL) tablet 500 mg, 500 mg, Oral, BID, Verne Grain., DO, 500 mg at 02/02/18 1829  ???  fentaNYL (DURAGESIC) 50 MCG/HR 1 patch, 1 patch, Transdermal, Q72H, Verne Grain., DO, 1 patch at 01/31/18 2129  ???  gabapentin (NEURONTIN) capsule 300 mg, 300 mg, Oral, TID, Verne Grain., DO, 300 mg at 02/02/18 9371  ???  ondansetron (ZOFRAN) tablet 8 mg, 8 mg, Oral, Q6H PRN, Verne Grain., DO, 8 mg at  01/29/18 2039  ???  oxyCODONE-acetaminophen (PERCOCET) 7.5-325 MG per tablet 1  tablet, 1 tablet, Oral, Q4H PRN, Verne Grain., DO, 1 tablet at 02/02/18 1000  ???  pantoprazole (PROTONIX) tablet 40 mg, 40 mg, Oral, QAM AC, Verne Grain., DO, Stopped at 02/02/18 [phone]  ???  prochlorperazine (COMPAZINE) tablet 10 mg, 10 mg, Oral, Q6H PRN, Verne Grain., DO  ???  sennosides-docusate sodium (SENOKOT-S) 8.6-50 MG tablet 2 tablet, 2 tablet, Oral, Daily, Verne Grain., DO, 2 tablet at 02/02/18 0908  ???  sertraline (ZOLOFT) tablet 50 mg, 50 mg, Oral, Daily, Verne Grain., DO, 50 mg at 02/02/18 2831  ???  valACYclovir (VALTREX) tablet 500 mg, 500 mg, Oral, BID, Verne Grain., DO, 500 mg at 02/02/18 5176  ???  meropenem (MERREM) 1 g in sodium chloride 0.9 % 100 mL IVPB (mini-bag), 1 g, Intravenous, Q8H, Verne Grain., DO, Stopped at 02/02/18 [phone]  ???  sodium chloride flush 0.9 % injection 10 mL, 10 mL, Intravenous, 2 times per day, Verne Grain., DO, 10 mL at 02/02/18 0909  ???  sodium chloride flush 0.9 % injection 10 mL, 10 mL, Intravenous, PRN, Verne Grain., DO  ???  potassium chloride 20 mEq/50 mL IVPB (Central Line), 20 mEq, Intravenous, PRN, Verne Grain., DO  ???  magnesium sulfate 4 g in 100 mL IVPB premix, 4 g, Intravenous, PRN, Verne Grain., DO  ???  magnesium hydroxide (MILK OF MAGNESIA) 400 MG/5ML suspension 30 mL, 30 mL, Oral, Daily PRN, Verne Grain., DO  ???  Saline Mouthwash 15 mL, 15 mL, Swish & Spit, 4x Daily AC & HS, Verne Grain., DO, 15 mL at 02/02/18 3710  ???  Saline Mouthwash 15 mL, 15 mL, Swish & Spit, Q4H PRN, Verne Grain., DO  ???  alteplase (CATHFLO) injection 2 mg, 2 mg, Intracatheter, PRN, Verne Grain., DO  ???  tiotropium [facility]) inhalation capsule 18 mcg, 18 mcg, Inhalation, Daily, Verne Grain., DO, 18 mcg at 02/02/18 0845  ???  budesonide (PULMICORT) nebulizer suspension 250 mcg, 0.25 mg, Nebulization, BID, 250 mcg at 02/02/18 0845 **AND**  formoterol (PERFOROMIST) nebulizer solution 20 mcg, 20 mcg, Nebulization, BID, Verne Grain., DO, 20 mcg at 02/02/18 0844  ???  LORazepam (ATIVAN) tablet 1 mg, 1 mg, Oral, Q4H PRN, Verne Grain., DO, 1 mg at 02/01/18 2321  ???  levalbuterol (XOPENEX) nebulizer solution 1.25 mg, 1.25 mg, Nebulization, Q4H WA, Verne Grain., DO, 1.25 mg at 02/02/18 6269    A/P:  62 y.o. admitted with severe SOB.  -Acute pericarditis  -Multiple myeloma  -COPD  -Neutropenia  -Pneumonia with "tree in bud" appearance of CT chest  -Hyperlipidemia  -parox A fib with RVR    Recs:  -Amiodarone 200 mg daily  -Cont metoprolol and dilt. We added metop on this morning and tolerating ok so far.  -If bp tolerates, increase dilt tomorrow    Roderic Palau A. Bunnie Philips, MD, [facility], [facility]

## 2018-02-02 NOTE — Progress Notes (Signed)
RESPIRATORY THERAPY ASSESSMENT    Name:  David Watkins Record Number:  [id number]  Age: 62 y.o.   Gender: male  DOB: [birth year]  Today's Date:  02/02/2018  Room:  3526/3526-01    Assessment     Is the patient being admitted for a COPD or Asthma exacerbation?  NA   (If yes the patient will be seen every 4 hours for the first 24 hours and then reassessed)    Patient Admission Diagnosis      Allergies  Allergies   Allergen Reactions   ??? Lenalidomide Rash   ??? Pomalidomide Hives   ??? Ceclor [Cefaclor] Hives       Minimum Predicted Vital Capacity:     1100          Actual Vital Capacity:      1500              Pulmonary History:Former Smoker  Home Oxygen Therapy:  room air  Home Respiratory Therapy:Albuterol   Current Respiratory Therapy:  Xopenex HHN QID; Pulmicort HHN BID; Perforomist HHN BID; Spiriva Daily  Treatment Type: HHN  Medications: Levalbuterol HCL    Respiratory Severity Index(RSI)   Patients with orders for inhalation medications, oxygen, or any therapeutic treatment modality will be placed on Respiratory Protocol.  They will be assessed with the first treatment and at least every 72 hours thereafter.  The following severity scale will be used to determine frequency of treatment intervention.    Smoking History: Pulmonary Disease or Smoking History, Greater than 15 pack year = 2    Social History  Social History     Tobacco Use   ??? Smoking status: Former Smoker     Last attempt to quit: 11/25/2012     Years since quitting: 5.1   ??? Smokeless tobacco: Never Used   Substance Use Topics   ??? Alcohol use: Yes     Comment: socially   ??? Drug use: No       Recent Surgical History: None = 0  Past Surgical History  Past Surgical History:   Procedure Laterality Date   ??? BONE MARROW BIOPSY     ??? BONE MARROW TRANSPLANT     ??? OTHER SURGICAL HISTORY Left 08/17/2016    trifusion cath placement   ??? PRE-MALIGNANT / BENIGN SKIN LESION EXCISION Left 03/29/2017    EXCISE LESION LEFT EXTERNAL EAR WITH FROZEN SECTION, FULL  THICKNESS SKIN GRAFT       Level of Consciousness: Alert, Oriented, and Cooperative = 0    Level of Activity: Walking with assistance = 1    Respiratory Pattern: Dyspnea with exertion;Irregular pattern;or RR less than 6 = 2    Breath Sounds: Diminshed bilaterally and/or crackles = 2    Sputum  Sputum Color: White, Tenacity: Thick, Sputum How Obtained: Spontaneous cough  Cough: Strong, productive = 1    Vital Signs   BP 101/72    Pulse 64    Temp 97.6 ??F (36.4 ??C) (Oral)    Resp 14    Ht '5\' 9"'$  (1.753 m)    Wt 194 lb 14.2 oz (88.4 kg)    SpO2 95%    BMI 28.78 kg/m??   SPO2 (COPD values may differ): Greater than or equal to 92% on room air = 0    Peak Flow (asthma only): not applicable = 0    RSI: 7-8 = BID and Q4HPRN (every four hours as needed) for dyspnea  Plan       Goals: medication delivery    Patient/caregiver was educated on the proper method of use for Respiratory Care Devices:  Yes      Level of patient/caregiver understanding able to:   ? Verbalize understanding   ? Demonstrate understanding       ? Teach back        ? Needs reinforcement       ?  No available caregiver               ?  Other:     Response to education:  Excellent     Is patient being placed on Home Treatment Regimen?  No     Does the patient have everything they need prior to discharge?  NA     Comments: Chart reviewed    Plan of Care: Continue current therapy    Electronically signed by Ward Chatters, RCP on 02/02/2018 at 7:37 PM    Respiratory Protocol Guidelines     1. Assessment and treatment by Respiratory Therapy will be initiated for medication and therapeutic interventions upon initiation of aerosolized medication.  2. Physician will be contacted for respiratory rate (RR) greater than 35 breaths per minute. Therapy will be held for heart rate (HR) greater than 140 beats per minute, pending direction from physician.  3. Bronchodilators will be administered via Metered Dose Inhaler (MDI) with spacer when the following criteria are  met:  a. Alert and cooperative     b. HR < 140 bpm  c. RR < 30 bpm                d. Can demonstrate a 2-3 second inspiratory hold  4. Bronchodilators will be administered via Hand Held Nebulizer [facility]) to patients when ANY of the following criteria are met  a. Incognizant or uncooperative          b. Patients treated with HHN at Home        c. Unable to demonstrate proper use of MDI with spacer     d. RR > 30 bpm   5. Bronchodilators will be delivered via Metered Dose Inhaler (MDI), HHN, Aerogen to intubated patients on mechanical ventilation.  6. Inhalation medication orders will be delivered and/or substituted as outlined below.    Aerosolized Medications Ordering and Administration Guidelines:    1. All Medications will be ordered by a physician, and their frequency and/or modality will be adjusted as defined by the patients Respiratory Severity Index (RSI) score.  2. If the patient does not have documented COPD, consider discontinuing anticholinergics when RSI is less than 9.  3. If the bronchospasm worsens (increased RSI), then the bronchodilator frequency can be increased to a maximum of every 4 hours.  If greater than every 4 hours is required, the physician will be contacted.  4. If the bronchospasm improves, the frequency of the bronchodilator can be decreased, based on the patient's RSI, but not less than home treatment regimen frequency.  5. Bronchodilator(s) will be discontinued if patient has a RSI less than 9 and has received no scheduled or as needed treatment for 72  Hrs.    Patients Ordered on a Mucolytic Agent:    1. Must always be administered with a bronchodilator.    2. Discontinue if patient experiences worsened bronchospasm, or secretions have lessened to the point that the patient is able to clear them with a cough.    Anti-inflammatory and Combination Medications:    1.  If the patient lacks prior history of lung disease, is not using inhaled anti-inflammatory medication at home, and lacks  wheezing by examination or by history for at least 24 hours, contact physician for possible discontinuation.

## 2018-02-02 NOTE — Progress Notes (Addendum)
8756: Pt converted back to Afib from sinus rhythm with a HR between 95-110bpm. All other vital signs stable. Asymptomatic. Attempted to call cardiology three times, pt stable, awaiting response.

## 2018-02-03 LAB — CBC WITH AUTO DIFFERENTIAL
Basophils %: 0 %
Basophils Absolute: 0 10*3/uL (ref 0.0–0.2)
Eosinophils %: 0 %
Eosinophils Absolute: 0 10*3/uL (ref 0.0–0.6)
Hematocrit: 24 % — ABNORMAL LOW (ref 40.5–52.5)
Hemoglobin: 8.1 g/dL — ABNORMAL LOW (ref 13.5–17.5)
Lymphocytes %: 3 %
Lymphocytes Absolute: 0.1 10*3/uL — ABNORMAL LOW (ref 1.0–5.1)
MCH: 31.2 pg (ref 26.0–34.0)
MCHC: 33.9 g/dL (ref 31.0–36.0)
MCV: 92.1 fL (ref 80.0–100.0)
MPV: 9.7 fL (ref 5.0–10.5)
Monocytes %: 7 %
Monocytes Absolute: 0.3 10*3/uL (ref 0.0–1.3)
Neutrophils %: 90 %
Neutrophils Absolute: 3.3 10*3/uL (ref 1.7–7.7)
Platelets: 14 10*3/uL — CL (ref 135–450)
RBC: 2.6 M/uL — ABNORMAL LOW (ref 4.20–5.90)
RDW: 17.6 % — ABNORMAL HIGH (ref 12.4–15.4)
WBC: 3.7 10*3/uL — ABNORMAL LOW (ref 4.0–11.0)

## 2018-02-03 LAB — BASIC METABOLIC PANEL
Anion Gap: 9 (ref 3–16)
BUN: 25 mg/dL — ABNORMAL HIGH (ref 7–20)
CO2: 27 mmol/L (ref 21–32)
Calcium: 8.4 mg/dL (ref 8.3–10.6)
Chloride: 96 mmol/L — ABNORMAL LOW (ref 99–110)
Creatinine: 0.6 mg/dL — ABNORMAL LOW (ref 0.9–1.3)
GFR African American: >60 (ref >60)
GFR Non-African American: >60 (ref >60)
Glucose: 209 mg/dL — ABNORMAL HIGH (ref 70–99)
Potassium: 4.5 mmol/L (ref 3.5–5.1)
Sodium: 132 mmol/L — ABNORMAL LOW (ref 136–145)

## 2018-02-03 LAB — CELL COUNT WITH DIFFERENTIAL, BAL FLUID
Lymphocytes, BAL: 8 % (ref 5–10)
Macrophages, BAL: 37 % — ABNORMAL LOW (ref 90–95)
Number of Cells Counted BAL (Lavage): 200
RBC, BAL: 25 /mm3
Segmented Neutrophils, BAL: 55 % — ABNORMAL HIGH (ref 5–10)
WBC/EPI Cells Bal: 425 /mm3

## 2018-02-03 LAB — PREPARE PLATELETS
Dispense Status Blood Bank: TRANSFUSED
Dispense Status Blood Bank: TRANSFUSED

## 2018-02-03 LAB — TYPE AND SCREEN
ABO/Rh: O POS
Antibody Screen: POSITIVE

## 2018-02-03 LAB — APTT: aPTT: 46.7 s — ABNORMAL HIGH (ref 26.0–36.0)

## 2018-02-03 LAB — POCT GLUCOSE
POC Glucose: 264 mg/dl — ABNORMAL HIGH (ref 70–99)
POC Glucose: 166 mg/dl — ABNORMAL HIGH (ref 70–99)
POC Glucose: 238 mg/dl — ABNORMAL HIGH (ref 70–99)

## 2018-02-03 LAB — KAPPA/LAMBDA QUANTITATIVE FREE LIGHT CHAINS, SERUM
K/L RATIO: 3.23 — ABNORMAL HIGH (ref 0.26–1.65)
KAPPA, FREE LIGHT CHAINS, SERUM: 4.69 mg/L (ref 3.30–19.40)
LAMBDA, FREE LIGHT CHAINS, SERUM: 1.45 mg/L — ABNORMAL LOW (ref 5.71–26.30)

## 2018-02-03 LAB — DAT IGG: DAT IgG: NEGATIVE

## 2018-02-03 LAB — PROTIME-INR
INR: 1.51 — ABNORMAL HIGH (ref 0.86–1.14)
Protime: 17.2 s — ABNORMAL HIGH (ref 9.8–13.0)

## 2018-02-03 LAB — ANTIBODY IDENTIFICATION: Antibody ID: POSITIVE

## 2018-02-03 LAB — PLATELET COUNT
Platelets: 39 10*3/uL — ABNORMAL LOW (ref 135–450)
Platelets: 75 10*3/uL — ABNORMAL LOW (ref 135–450)

## 2018-02-03 LAB — MAGNESIUM: Magnesium: 1.8 mg/dL (ref 1.80–2.40)

## 2018-02-03 LAB — PHOSPHORUS: Phosphorus: 2.2 mg/dL — ABNORMAL LOW (ref 2.5–4.9)

## 2018-02-03 MED ORDER — PROMETHAZINE HCL 25 MG/ML IJ SOLN
25 MG/ML | Freq: Once | INTRAMUSCULAR | Status: DC | PRN
Start: 2018-02-03 — End: 2018-02-03

## 2018-02-03 MED ORDER — MIDAZOLAM HCL 2 MG/2ML IJ SOLN
2 | INTRAMUSCULAR | Status: AC
Start: 2018-02-03 — End: 2018-02-03

## 2018-02-03 MED ORDER — SUGAMMADEX SODIUM 200 MG/2ML IV SOLN
200 MG/2ML | INTRAVENOUS | Status: DC | PRN
Start: 2018-02-03 — End: 2018-02-03
  Administered 2018-02-03: 17:00:00 200 via INTRAVENOUS

## 2018-02-03 MED ORDER — ONDANSETRON HCL 4 MG/2ML IJ SOLN
4 MG/2ML | Freq: Once | INTRAMUSCULAR | Status: DC | PRN
Start: 2018-02-03 — End: 2018-02-03

## 2018-02-03 MED ORDER — PROPOFOL 200 MG/20ML IV EMUL
200 | INTRAVENOUS | Status: AC
Start: 2018-02-03 — End: 2018-02-03

## 2018-02-03 MED ORDER — ROCURONIUM BROMIDE 50 MG/5ML IV SOLN
50 | INTRAVENOUS | Status: AC
Start: 2018-02-03 — End: 2018-02-03

## 2018-02-03 MED ORDER — HYDROMORPHONE HCL 1 MG/ML IJ SOLN
1 MG/ML | INTRAMUSCULAR | Status: DC | PRN
Start: 2018-02-03 — End: 2018-02-03

## 2018-02-03 MED ORDER — PROPOFOL 200 MG/20ML IV EMUL
200 MG/20ML | INTRAVENOUS | Status: DC | PRN
Start: 2018-02-03 — End: 2018-02-03
  Administered 2018-02-03: 16:00:00 100 via INTRAVENOUS
  Administered 2018-02-03: 16:00:00 50 via INTRAVENOUS

## 2018-02-03 MED ORDER — LIDOCAINE (CARDIAC) 100 MG/5ML IV SOLN (MIXTURES ONLY)
100 MG/5ML | INTRAVENOUS | Status: DC | PRN
Start: 2018-02-03 — End: 2018-02-03
  Administered 2018-02-03: 16:00:00 80 via INTRAVENOUS

## 2018-02-03 MED ORDER — LABETALOL HCL 20 MG/4ML IV SOSY
20 MG/4ML | INTRAVENOUS | Status: DC | PRN
Start: 2018-02-03 — End: 2018-02-03

## 2018-02-03 MED ORDER — FENTANYL CITRATE (PF) 100 MCG/2ML IJ SOLN
100 MCG/2ML | INTRAMUSCULAR | Status: DC | PRN
Start: 2018-02-03 — End: 2018-02-03

## 2018-02-03 MED ORDER — SODIUM CHLORIDE 0.9 % IV BOLUS
0.9 % | Freq: Once | INTRAVENOUS | Status: DC
Start: 2018-02-03 — End: 2018-02-04

## 2018-02-03 MED ORDER — PHENYLEPHRINE HCL 10 MG/ML SOLN (MIXTURES ONLY)
10 MG/ML | Status: DC | PRN
Start: 2018-02-03 — End: 2018-02-03
  Administered 2018-02-03 (×4): 100 via INTRAVENOUS

## 2018-02-03 MED ORDER — SUGAMMADEX SODIUM 200 MG/2ML IV SOLN
200 | INTRAVENOUS | Status: AC
Start: 2018-02-03 — End: 2018-02-03

## 2018-02-03 MED ORDER — SODIUM CHLORIDE 0.9 % IV SOLN
0.9 % | INTRAVENOUS | Status: DC | PRN
Start: 2018-02-03 — End: 2018-02-03
  Administered 2018-02-03: 16:00:00 via INTRAVENOUS

## 2018-02-03 MED ORDER — ROCURONIUM BROMIDE 50 MG/5ML IV SOLN
50 MG/5ML | INTRAVENOUS | Status: DC | PRN
Start: 2018-02-03 — End: 2018-02-03
  Administered 2018-02-03: 16:00:00 30 via INTRAVENOUS

## 2018-02-03 MED ORDER — FENTANYL CITRATE (PF) 100 MCG/2ML IJ SOLN
100 | INTRAMUSCULAR | Status: AC
Start: 2018-02-03 — End: 2018-02-03

## 2018-02-03 MED ORDER — OXYCODONE-ACETAMINOPHEN 5-325 MG PO TABS
5-325 MG | Freq: Once | ORAL | Status: DC | PRN
Start: 2018-02-03 — End: 2018-02-03

## 2018-02-03 MED ORDER — PROPOFOL 200 MG/20ML IV EMUL
200 MG/20ML | INTRAVENOUS | Status: DC | PRN
Start: 2018-02-03 — End: 2018-02-03
  Administered 2018-02-03: 16:00:00 150 via INTRAVENOUS

## 2018-02-03 MED ORDER — MAGNESIUM SULFATE 2000 MG/50 ML IVPB PREMIX
2 GM/50ML | Freq: Once | INTRAVENOUS | Status: AC
Start: 2018-02-03 — End: 2018-02-03
  Administered 2018-02-03: 13:00:00 2 g via INTRAVENOUS

## 2018-02-03 MED ORDER — LIDOCAINE HCL 2 % IJ SOLN
2 | INTRAMUSCULAR | Status: AC
Start: 2018-02-03 — End: 2018-02-03

## 2018-02-03 MED ORDER — HYDRALAZINE HCL 20 MG/ML IJ SOLN
20 MG/ML | INTRAMUSCULAR | Status: DC | PRN
Start: 2018-02-03 — End: 2018-02-03

## 2018-02-03 MED ORDER — LIDOCAINE (CARDIAC) 100 MG/5ML IV SOLN (MIXTURES ONLY)
100 | INTRAVENOUS | Status: AC
Start: 2018-02-03 — End: 2018-02-03

## 2018-02-03 MED ORDER — MIDAZOLAM HCL 2 MG/2ML IJ SOLN
2 MG/ML | INTRAMUSCULAR | Status: DC | PRN
Start: 2018-02-03 — End: 2018-02-03
  Administered 2018-02-03: 16:00:00 2 via INTRAVENOUS

## 2018-02-03 MED ORDER — FENTANYL CITRATE (PF) 100 MCG/2ML IJ SOLN
100 MCG/2ML | INTRAMUSCULAR | Status: DC | PRN
Start: 2018-02-03 — End: 2018-02-03
  Administered 2018-02-03: 16:00:00 50 via INTRAVENOUS

## 2018-02-03 MED FILL — GABAPENTIN 300 MG PO CAPS: 300 mg | ORAL | Qty: 1

## 2018-02-03 MED FILL — AMIODARONE HCL 200 MG PO TABS: 200 mg | ORAL | Qty: 1

## 2018-02-03 MED FILL — SODIUM CHLORIDE 0.9 % IV SOLN: 0.9 % | INTRAVENOUS | Qty: 250

## 2018-02-03 MED FILL — LIDOCAINE HCL (CARDIAC) 100 MG/5ML IV SOSY: 100 MG/5ML | INTRAVENOUS | Qty: 5

## 2018-02-03 MED FILL — MAGNESIUM SULFATE 2 GM/50ML IV SOLN: 2 GM/50ML | INTRAVENOUS | Qty: 50

## 2018-02-03 MED FILL — BUDESONIDE 0.25 MG/2ML IN SUSP: 0.25 MG/2ML | RESPIRATORY_TRACT | Qty: 2

## 2018-02-03 MED FILL — MUCINEX 600 MG PO TB12: 600 mg | ORAL | Qty: 1

## 2018-02-03 MED FILL — METOPROLOL TARTRATE 50 MG PO TABS: 50 mg | ORAL | Qty: 1

## 2018-02-03 MED FILL — OXYCODONE-ACETAMINOPHEN 7.5-325 MG PO TABS: 7.5-325 mg | ORAL | Qty: 1

## 2018-02-03 MED FILL — MEROPENEM 1 G IV SOLR: 1 g | INTRAVENOUS | Qty: 1

## 2018-02-03 MED FILL — VALACYCLOVIR HCL 500 MG PO TABS: 500 mg | ORAL | Qty: 1

## 2018-02-03 MED FILL — PROPOFOL 200 MG/20ML IV EMUL: 200 MG/20ML | INTRAVENOUS | Qty: 40

## 2018-02-03 MED FILL — SENNOSIDES-DOCUSATE SODIUM 8.6-50 MG PO TABS: 8.6-50 mg | ORAL | Qty: 2

## 2018-02-03 MED FILL — ZEMURON 50 MG/5ML IV SOLN: 50 MG/5ML | INTRAVENOUS | Qty: 5

## 2018-02-03 MED FILL — LIPITOR 20 MG PO TABS: 20 mg | ORAL | Qty: 1

## 2018-02-03 MED FILL — LEVALBUTEROL HCL 1.25 MG/0.5ML IN NEBU: 1.25 MG/0.5ML | RESPIRATORY_TRACT | Qty: 1

## 2018-02-03 MED FILL — ERAXIS 100 MG IV SOLR: 100 mg | INTRAVENOUS | Qty: 30

## 2018-02-03 MED FILL — BANOPHEN 25 MG PO TABS: 25 mg | ORAL | Qty: 1

## 2018-02-03 MED FILL — PREDNISONE 5 MG PO TABS: 5 mg | ORAL | Qty: 1

## 2018-02-03 MED FILL — PANTOPRAZOLE SODIUM 40 MG PO TBEC: 40 mg | ORAL | Qty: 1

## 2018-02-03 MED FILL — ACETAMINOPHEN 325 MG PO TABS: 325 mg | ORAL | Qty: 2

## 2018-02-03 MED FILL — OYSTER SHELL CALCIUM 500 MG PO TABS: 500 mg | ORAL | Qty: 1

## 2018-02-03 MED FILL — SODIUM CHLORIDE 0.9 % IV SOLN: 0.9 % | INTRAVENOUS | Qty: 1000

## 2018-02-03 MED FILL — BRIDION 200 MG/2ML IV SOLN: 200 MG/2ML | INTRAVENOUS | Qty: 2

## 2018-02-03 MED FILL — FENTANYL CITRATE (PF) 100 MCG/2ML IJ SOLN: 100 MCG/2ML | INTRAMUSCULAR | Qty: 2

## 2018-02-03 MED FILL — MIDAZOLAM HCL 2 MG/2ML IJ SOLN: 2 mg/mL | INTRAMUSCULAR | Qty: 2

## 2018-02-03 MED FILL — PERFOROMIST 20 MCG/2ML IN NEBU: 20 MCG/2ML | RESPIRATORY_TRACT | Qty: 2

## 2018-02-03 MED FILL — DILTIAZEM HCL ER COATED BEADS 240 MG PO CP24: 240 mg | ORAL | Qty: 1

## 2018-02-03 MED FILL — SERTRALINE HCL 50 MG PO TABS: 50 mg | ORAL | Qty: 1

## 2018-02-03 MED FILL — CELEBREX 100 MG PO CAPS: 100 mg | ORAL | Qty: 1

## 2018-02-03 MED FILL — LIDOCAINE HCL 2 % IJ SOLN: 2 % | INTRAMUSCULAR | Qty: 20

## 2018-02-03 MED FILL — LORAZEPAM 1 MG PO TABS: 1 mg | ORAL | Qty: 1

## 2018-02-03 NOTE — Other (Signed)
02/03/18 1012   Encounter Summary   Services provided to: Patient   Referral/Consult From: Rounding   Continue Visiting   (es 6/6)   Complexity of Encounter Moderate   Length of Encounter 15 minutes   Spiritual/Religious   Type Spiritual support   Assessment Approachable   Intervention Active listening;Prayer   Outcome Receptive;Engaged in conversation

## 2018-02-03 NOTE — Progress Notes (Signed)
Oral airway removed, pt coughed small amount of mucous suctioned, BS clear with exp wheezes noted, resp called for treatment

## 2018-02-03 NOTE — Progress Notes (Signed)
pt arrived from ENDO, s/p BRONCHOSCOPY WITH BAL, CHOICE ANESTHESIA (N/A )   BRONCHOSCOPY THERAPUTIC ASPIRATION SUBSEQUENT, reprot received from CRNA and Endo RN.  Pt very sleepy on arrival, oral airway inplace, unable to answer questions at this time.  BS with exp wheezes

## 2018-02-03 NOTE — Progress Notes (Addendum)
Physical Therapy/Occupational Therapy  No Treatment    Pt currently at bronchoscopy,  Will continue per POC as pt schedule permits.      Heidi Welti PT  [phone]  K.L. Caralee Ates, OTR/L [phone]

## 2018-02-03 NOTE — Progress Notes (Signed)
Pulmonary Followup Note    CC: COPD exacerbation, pneumonia, neutropenic fever and myeloma  Subjective:  Reports continued improvement with his breathing and chest tightness, however, still having cough and wheezing and appears dyspneic.  Slept reasonably well overnight and is emotionally ready for his bronch today.    ROS:  Denies headache, nausea or chest pain.    24HR INTAKE/OUTPUT:      Intake/Output Summary (Last 24 hours) at 02/03/2018 1210  Last data filed at 02/03/2018 0857  Gross per 24 hour   Intake 1688 ml   Output 1500 ml   Net 188 ml       ??? sodium chloride  250 mL Intravenous Once   ??? sodium chloride  250 mL Intravenous Once   ??? metoprolol tartrate  50 mg Oral BID   ??? predniSONE  25 mg Oral BID   ??? amiodarone  200 mg Oral Daily   ??? guaiFENesin  600 mg Oral BID   ??? diltiazem  240 mg Oral Daily   ??? celecoxib  100 mg Oral BID   ??? insulin lispro  0-18 Units Subcutaneous TID WC   ??? insulin lispro  0-9 Units Subcutaneous Nightly   ??? anidulafungin  100 mg Intravenous Q24H   ??? atorvastatin  10 mg Oral Nightly   ??? calcium elemental  500 mg Oral BID   ??? fentaNYL  1 patch Transdermal Q72H   ??? gabapentin  300 mg Oral TID   ??? pantoprazole  40 mg Oral QAM AC   ??? sennosides-docusate sodium  2 tablet Oral Daily   ??? sertraline  50 mg Oral Daily   ??? valACYclovir  500 mg Oral BID   ??? meropenem  1 g Intravenous Q8H   ??? sodium chloride flush  10 mL Intravenous 2 times per day   ??? Saline Mouthwash  15 mL Swish & Spit 4x Daily AC & HS   ??? tiotropium  18 mcg Inhalation Daily   ??? budesonide  0.25 mg Nebulization BID    And   ??? formoterol  20 mcg Nebulization BID   ??? levalbuterol  1.25 mg Nebulization Q4H WA           PHYSICAL EXAMINATION:  BP 136/84    Pulse 64    Temp 97.2 ??F (36.2 ??C) (Oral)    Resp 15    Ht 5' 9"  (1.753 m)    Wt 198 lb 3.1 oz (89.9 kg)    SpO2 94%    BMI 29.27 kg/m??   CURRENT PULSE OXIMETRY:  SpO2: 94 %  24HR PULSE OXIMETRY RANGE:  SpO2  Avg: 95.6 %  Min: 94 %  Max: 97 %  on ra      Gen: mild distress. Speaking in full sentences with trace accessory muscle use  HEENT: PERRL, EOMI, OP nl  Lung: inspiratory ronchi and expiratory wheezing throughout  CV: RRR without M/R/R  Abd: +BS, soft, NT/ND  Ext: No edema.    DATA  CBC:   Recent Labs     02/01/18  0357 02/02/18  0345 02/03/18  0400 02/03/18  0725 02/03/18  0940   WBC 3.2* 3.4* 3.7*  --   --    HGB 8.3* 8.5* 8.1*  --   --    HCT 25.0* 25.2* 24.0*  --   --    MCV 93.3 93.0 92.1  --   --    PLT 14* 13* 14* 39* 75*     BMP:   Recent  Labs     02/01/18  0357 02/02/18  0345 02/03/18  0410   NA 131* 133* 132*   K 4.5 4.7 4.5   CL 96* 98* 96*   CO2 26 27 27    PHOS 2.3* 2.3* 2.2*   BUN 25* 22* 25*   CREATININE <0.5* 0.5* 0.6*     No results for input(s): PHART, PCO2ART, PO2ART in the last 72 hours.  LIVER PROFILE:   Recent Labs     02/02/18  0345   AST 17   ALT 53*   BILIDIR <0.2   BILITOT 0.3   ALKPHOS 100       CXR REVIEWED BY ME AND SHOWED:  CT Chest WO Contrast   Final Result      Stable innumerable pulmonary nodules bilaterally favoring metastatic disease.      Minimal decrease in size of dominant left axillary lymph node Jan 27, 2018      Stable osteolytic lesions of the axial skeleton, likely metastatic.            CT CHEST W CONTRAST   Final Result   1. Interval development of multiple pulmonary nodules seen scattered throughout both lungs. There is also some nodular areas of patchy consolidation within the left lung and some interstitial infiltrate within the right upper lobe periphery. Findings are    nonspecific but favor an inflammatory etiology such as an atypical pneumonia or bronchiolitis. Interval development of pulmonary metastatic disease is considered less likely.   2. Moderate emphysema.   3. Left axillary lymphadenopathy is mildly decreased in comparison the prior study.   4. Previously seen abnormal paraspinal soft tissue identified along the lower thoracic spine has decreased in size and only some small ill-defined  nodular soft tissue remains. Findings are consistent with neoplastic disease which is responding to    therapy.   5. Stable osseous metastatic disease.      XR CHEST PORTABLE   Final Result      No acute pulmonary pathology or change from the prior study                  CTA PULMONARY W CONTRAST   Final Result   1.  No pulmonary embolism.   2.  Patchy groundglass opacities and tree-in-bud are seen throughout the    lungs which are new compared to prior examination.  This most likely    represents atypical infection/inflammation.  Clinical correlation is    recommended.   3.  Paraseptal and centrilobular emphysematous change of the lungs.   4.  Enlarged left axillary lymph nodes are not significantly changed    compared to PET/CT on 01/10/2018.  This is worrisome for nodal metastasis.   5.  Scattered lucent lesions are seen throughout the ribs, spine, and    scapula.  Findings may represent multiple myeloma or osseous metastasis.          XR CHEST STANDARD (2 VW)   Final Result      No acute cardiopulmonary findings.           ASSESSMENT/PLAN:  This is a 62 y.o. male with Fever, pulmonary nodules and multiple myeloma    Bronchoscopy today as planned to evaluate why his lungs still sound so terrible and his secretions and cough remain.  Got two units of platelets this morning so they are over 50.  Plan is to aspirate secretions and perform a BAL to the lingula of his LUL as he has the  largest and most nodules in that anterior area which will hopefully help the lavage yield.    Continue slow prednisone wean  Continue empiric antibiotics and treatments for aspergillus    Neville Route, MD

## 2018-02-03 NOTE — Plan of Care (Signed)
Problem: Falls - Risk of:  Goal: Will remain free from falls  Description  Will remain free from falls  Outcome: Ongoing  Note:   Pt is a High fall risk. See Leamon Arnt Fall Score and ABCDS Injury Risk assessments.   Explained fall risk precautions to pt and family and rationale behind their use to keep the patient safe. Pt bed is in low position, side rails up, call light and belongings are in reach. Fall wristband applied and present on pts wrist.  Bed alarm on.  Pt encouraged to call for assistance. Will continue with hourly rounds for PO intake, pain needs, toileting and repositioning as needed.          Problem: Pain:  Goal: Pain level will decrease  Description  Pain level will decrease  Outcome: Ongoing  Note:   Zen continues to c/o chronic lower back pain- pain medication given with good result- see eMAR.      Problem: Bleeding:  Goal: Will show no signs and symptoms of excessive bleeding  Description  Will show no signs and symptoms of excessive bleeding  Outcome: Ongoing  Note:   Patient's hemoglobin this AM:   Recent Labs     02/04/18  0344   HGB 8.3*     Patient's platelet count this AM:   Recent Labs     02/04/18  0344   PLT 55*    Thrombocytopenia Precautions in place.  Patient showing no signs or symptoms of active bleeding.  Transfusion not indicated at this time.  Patient verbalizes understanding of all instructions. Will continue to assess and implement POC. Call light within reach and hourly rounding in place.         Problem: PROTECTIVE PRECAUTIONS  Goal: Patient will remain free of nosocomial Infections  Outcome: Ongoing  Note:   Pt remains in protective precautions.  Pt educated on wearing mask when in hallways. Pt, staff, and visitors adhering to handwashing guidelines. Pt educated to shower or bathe daily with chlorhexidine and linens changed daily per protocol. Pt verbalizes understanding of low microbial diet. Will continue to monitor.

## 2018-02-03 NOTE — Procedures (Signed)
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 Severe Systemic Disease      Mallampati score:  2    Sedation plan:     Type of sedation used: General   Post Procedure Plan   Return to same level of care     The risks and benefits as well as alternatives to the procedure have been discussed with the patient and or family.  The patient and or next of kin understands and agrees to proceed.     DESCRIPTION OF PROCEDURE: A time out was taken. General anesthesia and an airway were kindly provided by anesthesia    The scope was passed with ease via the ETT.  A complete airway inspection was performed.  No endobronchial lesions were identified. There were thick, tenacious secretions throughout the bilateral upper and lower lobe airways.  Therapeutic aspiration was performed.  Washings were obtained throughout the airways.  A Bronchoalveolar lavage was obtained from the left upper lobe lingula with modest return.        The patient tolerated the procedure well. Recovery will be per endoscopy protocol.    Estimated blood loss:  non    FOLLOW UP:  Cultures, diatherix and cytology in three to five days.      Neville Route

## 2018-02-03 NOTE — Anesthesia Post-Procedure Evaluation (Signed)
Department of Anesthesiology  Postprocedure Note    Patient: David Watkins  MRN: [id number]  Birthdate: 02-Aug-1956  Date of evaluation: 02/03/2018  Time:  3:54 PM     Procedure Summary     Date:  02/03/18 Room / Location:  [facility] / [facility]    Anesthesia Start:  2376 Anesthesia Stop:  2831    Procedures:       BRONCHOSCOPY WITH BAL, CHOICE ANESTHESIA (N/A )      BRONCHOSCOPY THERAPUTIC ASPIRATION SUBSEQUENT Diagnosis:  (PULMONARY NODULES)    Surgeon:  Neville Route, MD Responsible Provider:  Sherrilee Gilles, DO    Anesthesia Type:  general ASA Status:  4          Anesthesia Type: general    Aldrete Phase I: Aldrete Score: 9    Aldrete Phase II:      Last vitals: Reviewed and per EMR flowsheets.       Anesthesia Post Evaluation    Patient location during evaluation: PACU  Patient participation: complete - patient participated  Level of consciousness: awake and alert  Airway patency: patent  Nausea & Vomiting: no nausea and no vomiting  Cardiovascular status: blood pressure returned to baseline  Respiratory status: acceptable  Hydration status: euvolemic

## 2018-02-03 NOTE — Progress Notes (Signed)
Ice chips given

## 2018-02-03 NOTE — Progress Notes (Signed)
PACU Transfer Note    Vitals:    02/03/18 1345   BP: 107/64   Pulse: 53   Resp: 12   Temp: 97 F (36.1 C)   SpO2: 98%       In: 333   Out: -     Pain assessment:    Pain Level: 6    Report given to Receiving unit RN.    02/03/2018 1:56 PM

## 2018-02-03 NOTE — Progress Notes (Signed)
[facility] Progress Note    02/03/2018     EDYN QAZI    MRN: [id number]    DOB: 04-17-1956    SUBJECTIVE: Going for bronch; no new changes    ECOG PS:  (2) Ambulatory and capable of self care, unable to carry out work activity, up and about > 50% or waking hours    Isolation: None    Medications    Scheduled Meds:  ??? sodium chloride  250 mL Intravenous Once   ??? metoprolol tartrate  50 mg Oral BID   ??? predniSONE  25 mg Oral BID   ??? amiodarone  200 mg Oral Daily   ??? guaiFENesin  600 mg Oral BID   ??? diltiazem  240 mg Oral Daily   ??? celecoxib  100 mg Oral BID   ??? insulin lispro  0-18 Units Subcutaneous TID WC   ??? insulin lispro  0-9 Units Subcutaneous Nightly   ??? anidulafungin  100 mg Intravenous Q24H   ??? atorvastatin  10 mg Oral Nightly   ??? calcium elemental  500 mg Oral BID   ??? fentaNYL  1 patch Transdermal Q72H   ??? gabapentin  300 mg Oral TID   ??? pantoprazole  40 mg Oral QAM AC   ??? sennosides-docusate sodium  2 tablet Oral Daily   ??? sertraline  50 mg Oral Daily   ??? valACYclovir  500 mg Oral BID   ??? meropenem  1 g Intravenous Q8H   ??? sodium chloride flush  10 mL Intravenous 2 times per day   ??? Saline Mouthwash  15 mL Swish & Spit 4x Daily AC & HS   ??? tiotropium  18 mcg Inhalation Daily   ??? budesonide  0.25 mg Nebulization BID    And   ??? formoterol  20 mcg Nebulization BID   ??? levalbuterol  1.25 mg Nebulization Q4H WA     Continuous Infusions:  ??? dextrose     ??? sodium chloride 20 mL/hr at 02/01/18 1903     PRN Meds:.acetaminophen, diphenhydrAMINE, potassium phosphate IVPB, glucose, dextrose, glucagon (rDNA), dextrose, potassium chloride, magnesium sulfate, LORazepam, albuterol, bisacodyl, ondansetron, oxyCODONE-acetaminophen, prochlorperazine, sodium chloride flush, potassium chloride, magnesium sulfate, magnesium hydroxide, Saline Mouthwash, alteplase, LORazepam    ROS:  As noted above, otherwise remainder of 10-point ROS negative    Physical Exam:     I&O:      Intake/Output Summary (Last 24 hours) at 02/03/2018  0800  Last data filed at 02/03/2018 0645  Gross per 24 hour   Intake 1595 ml   Output 1900 ml   Net -305 ml       Vital Signs:  BP (!) 123/92    Pulse 62    Temp 97.7 ??F (36.5 ??C) (Oral)    Resp 12    Ht 5' 9"  (1.753 m)    Wt 194 lb 14.2 oz (88.4 kg)    SpO2 96%    BMI 28.78 kg/m??     Weight:    Wt Readings from Last 3 Encounters:   02/02/18 194 lb 14.2 oz (88.4 kg)   01/18/18 194 lb 9.6 oz (88.3 kg)   01/10/18 194 lb 12.8 oz (88.4 kg)         ??  General: Awake, alert and oriented.  HEENT:??normocephalic, alopecia,??PERRL, no scleral erythema or icterus, Oral mucosa moist and intact  LYMPH: ??Left axillary lymphadenopathy??  NECK: supple without palpable adenopathy  BACK: Straight negative CVAT  SKIN:??warm dry and intact without lesions  rashes or masses  CHEST:??rhonchi w/ insp/exp wheeze,??without use of accessory muscles  CV: Normal S1 S2, irregular, no MRG  ABD:??NT ND normoactive BS, no palpable masses or hepatosplenomegaly  EXTREMITIES:??without edema, denies calf tenderness  NEURO: CN II - XII grossly intact  CATHETER:??Right IJ PAC (06/21/17, [facility])??- CDI        Data    CBC:   Recent Labs     02/01/18  0357 02/02/18  0345 02/03/18  0400 02/03/18  0725   WBC 3.2* 3.4* 3.7*  --    HGB 8.3* 8.5* 8.1*  --    HCT 25.0* 25.2* 24.0*  --    MCV 93.3 93.0 92.1  --    PLT 14* 13* 14* 39*     BMP/Mag:  Recent Labs     02/01/18  0357 02/02/18  0345 02/03/18  0410   NA 131* 133* 132*   K 4.5 4.7 4.5   CL 96* 98* 96*   CO2 26 27 27    PHOS 2.3* 2.3* 2.2*   BUN 25* 22* 25*   CREATININE <0.5* 0.5* 0.6*   MG 2.00 2.00 1.80     LIVP:   Recent Labs     02/02/18  0345   AST 17   ALT 53*   BILIDIR <0.2   BILITOT 0.3   ALKPHOS 100     Coags:   Recent Labs     02/03/18  0400   PROTIME 17.2*   INR 1.51*   APTT 46.7*     Uric Acid   Recent Labs     02/02/18  0345   LABURIC 1.8*       Diagnostics:  1. ??PET scan (01/10/18):    ??    2.  CTA Chest (525/19):  1. ??No pulmonary embolism.  2. ??Patchy groundglass opacities and tree-in-bud are seen  throughout the   lungs which are new compared to prior examination. ??This most likely   represents atypical infection/inflammation. ??Clinical correlation is   recommended.  3. ??Paraseptal and centrilobular emphysematous change of the lungs.  4. ??Enlarged left axillary lymph nodes are not significantly changed   compared to PET/CT on 01/10/2018. ??This is worrisome for nodal metastasis.  5. ??Scattered lucent lesions are seen throughout the ribs, spine, and   scapula. ??Findings may represent multiple myeloma or osseous metastasis.    3.  Echocardiogram (01/23/18):  ??Technically difficult examination.  ??Normal left ventricle size, wall thickness, and systolic function with an  ??estimated ejection fraction of 50-55%. No obvious regional wall motion  ??abnormalities are seen (inferior and inferoseptal walls do appear normal, as  ??clinically questioned). Diastolic filling parameters suggests normal  ??diastolic function.  ??Aortic valve appears sclerotic but opens adequately.  ??Possible small/organized pericardial effusion though fat pad cannot be ruled  ??out.    4.  CT Chest (01/31/18):  Stable innumerable pulmonary nodules bilaterally favoring metastatic disease.    Minimal decrease in size of dominant left axillary lymph node Jan 27, 2018    Stable osteolytic lesions of the axial skeleton, likely metastatic.    Myeloma Labs (01/03/18):  SPEP:????reveals that M2, previously characterized as monoclonal IgG kappa in  mid-to-slow gamma is 1.0 gm/dL, greater than the 0.2 gm/dL observed on 08 Nov 2017.??  SIFE:????Serum immunofixation electrophoresis reveals persistent M2 in mid-to-slow gamma, a monoclonal IgG kappa. ??A recent M-spike, M1, was minor monoclonal IgG lambda in mid-gamma; M1 continues to be absent.  SFLC:  Kappa:  30.80, previously (11/08/17) -??10.40  Lambda:  1.46, previously (11/08/17) -??5.97  Free Kappa Lambda Ratio:  21.10, previously (11/08/17) -??1.74  Quantitative Immunoglobulins:  IgG:??  1480, previously (11/08/17) -  552  IgA:??  <26, previously (11/08/17) -??30  IgM:??  <20    Myeloma Labs (01/27/18):  Serum Light Chains: Pending    SPE/IFE:??M-protein persists in the gamma region on serum protein electrophoresis. The amount of this protein is 0.5 gm/dL     Quantitative Immunoglobulins:  IgG:??  791  IgA:??  17  IgM:??  11  ??  PROBLEM LIST: ??????????   ????  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 ??COPD   9.????Influenza A??(10/12/17)  10.  COPD Exacerbation (12/2017)  11.  Pericarditis (12/2017)  12.  A. Fib w/ RVR (12/2017)  ????  TREATMENT: ??????????   ??  1. Rad Tx to T5-7, T10-L3, Right Scapula - 3000 cGy - Dr. Jamas Lav 03/20/16-04/01/16  2. RVD x1 03/20/16 - discontinued d/t rash   3. Velcade/Pomalyst/Dex x3 cycles 04/23/16-06/17/16 - discontinued d/t reaction to pomalyst  4. Velcade/Dex x 1 cycle 06/26/16 (last dose of dexamethasone 07/22/16  5. High-dose melphalan followed by administration of PBSCs 2.36 x10^6 cd34cells/kg on 08/28/16  6. Maintenance Revlimid 83m daily (12/2016-04/23/17)  7. Dexamethasone 461mdaily (04/24/17)  8. DCEP   Cycle #1 - 04/26/17  Cycle #2 - 05/25/17 - excellent response  9.??Dara/Velcade/Dex (C1D1 - 06/22/17) - BMBx ??10/18/17 - CR  Cycles 7-8 (21 day cycle)  - Dex 20 mg po D4,5.8,9,11 and 12  - Dara 16 mg/Kg D1 only with Dex 20 mg IV  - Velcade 1.3 mg/M2 D1,4,8 and 11  Cycle 9 and beyond (maintenance, 11/08/17??- 01/03/18)  Dara 16 mg/Kg Q 28 days  Dex 12 mg IV D1  Velcade Q 2 weeks??  10. DCEP   Cycle #1 - 01/13/18  ??  ASSESSMENT AND PLAN:??????????   ??  1. ??IgG kappa multiple myeloma / Plasma Cell Leukemia:??Relapsed disease??  - S/p??treatment??Dara/Velcade/Dex??x 8 cycles??(06/22/17 - 10/2017). Followed by??maintenance Dara??Q4 wks/Velcade??Q2ks/Dex??(started 11/08/17)  - Now w/ progressive disease based on myeloma labs (01/03/18) &  PET scan (01/10/18), see above??  ??  PLAN:??DCEP for disease and pain control. Hope for allogeneic transplant in CR2, but needs to disease response and improvement in lung function??    - M-spike (01/27/18) - 0.5 g/dL, previously 1.0 g/dL  ??  DCEP Cycle #1, Day + 23  ??  2. ??ID: Afebrile w/ possible bacterial and fungal multifocal PNA  - Fungitell and galactomannan (01/25/18) - Positive   - Cont Valtrex ppx  - Cont Meropenem Day + 14  - Eraxis Day + 11    3. Heme:??Chemotherapy induced pancytopenia   - Transfuse for Hgb < 7 and Platelets <??10K  - S/p Neulasta (01/19/18)  - Platelets prior to bronchoscopy   ??  4.??Metabolic: Stable renal fxn and e-lytes except for steroid-induced hyperglycemia, hypoNa and hypoPhos  - Cont high regimen Lispro SSI, AC&HS  - S/p Lasix 40 mg IV daily (01/25/18 - 01/30/18)   - Cont IVF:  NS @ 20 mL/hr  - Keep Mg > 2 and K+ > 4.0 and Phos > 2.0    5. Pulmonary:??Acute respiratory distress with mild hypercapnia and hypoxemia from COPD exacerbation and underlying PNA has resolved.  He also is being treated for bacterial and fungal PNA which was likely contributing to acute respiratory failure   -  Pulm Nodule: Stable??on??CT chest from??04/23/17??&??10/18/17 &??11/26/17; cont to monitor.   - CTPA??(11/26/17): No PE;??resolution of??mild upper lobe tree-in-bud opacities; Mild emphysema. Two 3 mm LLL pulmonary nodules, stable  - PFT (12/27/17):????compared to??07/2016:??FVC 3.25 L, 66%, and FEV1  1.42 L, 38%. FEV1/FVC is 44%. This is unchanged from prior study. Air trapping seen on lung volumes. Diffusion capacity 3.91, 79% of predicted, down from 5.21??  - CTPA (01/22/18):  No PE, patchy groundglass opacities and tree-in-bud are seen throughout the  lungs which are new compared to prior examination.   - CT chest (01/31/18): Stable innumerable pulmonary nodules bilaterally favoring metastatic disease.  - CT chest (01/31/18) cont to show pulm nodules  - Pulm following, appreciate recs. Cont regular f/u w/ Dr. Dorann Lodge on d/c  - Cont  home inhalers    - Cont Xopenox q4hrs  - Cont Steroid taper: Solumedrol 80 mg TID (started 01/22/18), 40 mg TID (01/25/18), 40 mg bid (01/26/18), decrease/change Pred 25 mg bid (02/03/18)  - Bronc (02/03/18) - Pending @ 1130    ??  6. GI/Nutrition: Appetite and oral intake is fair  - Cont PPI ppx w/ steroids??  - Cont low microbial diet ??  Constipation: ??Improved  - Cont Dulcolax prn & Senokot-s 2 tabs daily    ??  7. 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  - Echo (01/23/18):  LVEF 50-55# w/ normal diastolic fxn & no obvious regional wall motion abnormalities  - Cardiology following, appreciate recs  - Cont Lipitor??  - S/p Dilt gtt (stopped 01/27/18) & Amio gtt (stopped 01/31/18), cont Diltiazem CD 240 mg daily, Amiodarone 200 mg daily & Lopressor 25 mg bid  - Cont steroids for COPD exacerbation & pericarditis  - Cont Celebrex (started 01/26/18)  ??  8. Psych:  Ongoing concerns about disease progression and ability to get transplant   Anxiety/Depression: Ongoing  - Cont Zoloft??50 mg daily??  - Psych to follow   Insomnia:??No complaints??currently??  ??  9. Peripheral Neuropathy:??Ongoing  - Cont Gabapentin 300 mg??TID  ??  10. 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  Contraindications to pharmacologic prophylaxis:  Thrombocytopenia  Contraindications to mechanical prophylaxis: None    - Disposition: Once breathing and chest pain improves       Wayland Salinas, APRN - CNP       Merlyn Albert. Drucilla Schmidt, DO, MS

## 2018-02-03 NOTE — Progress Notes (Signed)
NSR.    PE:  Blood pressure 123/84, pulse 69, temperature 97.7 ??F (36.5 ??C), temperature source Oral, resp. rate 12, height 5' 9"  (1.753 m), weight 194 lb 14.2 oz (88.4 kg), SpO2 96 %.  General (appearance):  Alert and cooperative.   Eyes: Anicteric  Neck: Supple. No JVD  Ears/Nose/Mouth/Thorat: No cyanosis  CV: RRR. No obvious m/r/g  Respiratory:  + mild end-expiratory wheezing  GI: Abd s/nt/nd. No peritoneal signs  Skin: Warm, dry. No rashes  Neuro/Psych: Alert and oriented x 3. Appropriate behavior  Ext:  No c/c. Mild edema  Pulses:  2+ radial    Lab Results   Component Value Date    WBC 3.7 (L) 02/03/2018    HGB 8.1 (L) 02/03/2018    HCT 24.0 (L) 02/03/2018    MCV 92.1 02/03/2018    PLT 39 (L) 02/03/2018     Lab Results   Component Value Date    NA 132 (L) 02/03/2018    K 4.5 02/03/2018    CL 96 (L) 02/03/2018    CO2 27 02/03/2018    BUN 25 (H) 02/03/2018    CREATININE 0.6 (L) 02/03/2018    GLUCOSE 209 (H) 02/03/2018    CALCIUM 8.4 02/03/2018    PROT 5.0 (L) 02/02/2018    LABALBU 2.8 (L) 02/02/2018    BILITOT 0.3 02/02/2018    ALKPHOS 100 02/02/2018    AST 17 02/02/2018    ALT 53 (H) 02/02/2018    LABGLOM >60 02/03/2018    GFRAA >60 02/03/2018    AGRATIO 1.0 (L) 01/22/2018    GLOB 3.1 01/22/2018     Lab Results   Component Value Date    INR 1.51 (H) 02/03/2018    INR 1.12 01/31/2018    INR 1.11 01/27/2018    PROTIME 17.2 (H) 02/03/2018    PROTIME 12.8 01/31/2018    PROTIME 12.7 01/27/2018     Lab Results   Component Value Date    TROPONINI <0.01 01/22/2018     Lab Results   Component Value Date    ALT 53 (H) 02/02/2018    AST 17 02/02/2018    ALKPHOS 100 02/02/2018    BILITOT 0.3 02/02/2018     Lab Results   Component Value Date    ALT 53 (H) 02/02/2018    AST 17 02/02/2018    ALKPHOS 100 02/02/2018    BILITOT 0.3 02/02/2018       01/21/2018 TTE: EF 50-55%. No def RWMA (inferior and inferoseptum was normal). No sig valve regurgitation or stenosis.    01/22/18 ECGs reviewed. Earlier ecg seemed to demonstrate acute  inferior MI but Tns negative and further ecgs showed evolution other ST segments suggesting acute pericarditis.    01/21/2018 CTA Chest: No PE. Tree-in-bud bilateral opacities suggesting atypical infection/inflammation. Enlarged L axillary notes unchanged. Ribs, spine, and scapula lesions suggesting MM/bony mets. No sig pericardial effusion noted.    01/22/18 CXR: Hyperinflated lungs. No definite acute consolidation      Current Facility-Administered Medications:   ???  0.9 % sodium chloride bolus, 250 mL, Intravenous, Once, Harlene Salts, MD  ???  0.9 % sodium chloride bolus, 250 mL, Intravenous, Once, Neville Route, MD  ???  magnesium sulfate 2 g in 50 mL IVPB premix, 2 g, Intravenous, Once, Rodrigo Ran, APRN - CNP  ???  metoprolol tartrate (LOPRESSOR) tablet 50 mg, 50 mg, Oral, BID, Glennon Hamilton, MD, 50 mg at 02/03/18 2841  ???  predniSONE (DELTASONE) tablet  25 mg, 25 mg, Oral, BID, Jody M Moehring, APRN - CNP, 25 mg at 02/03/18 8756  ???  amiodarone (CORDARONE) tablet 200 mg, 200 mg, Oral, Daily, Krystal Clark, MD, 200 mg at 02/03/18 4332  ???  guaiFENesin (MUCINEX) extended release tablet 600 mg, 600 mg, Oral, BID, Neville Route, MD, 600 mg at 02/03/18 0841  ???  acetaminophen (TYLENOL) tablet 650 mg, 650 mg, Oral, PRN, Jody M Moehring, APRN - CNP, 650 mg at 02/03/18 0554  ???  diphenhydrAMINE (BENADRYL) tablet 25 mg, 25 mg, Oral, PRN, Jody M Moehring, APRN - CNP, 25 mg at 02/03/18 0554  ???  diltiazem (CARDIZEM CD) extended release capsule 240 mg, 240 mg, Oral, Daily, Krystal Clark, MD, 240 mg at 02/03/18 0841  ???  potassium phosphate 30 mmol in dextrose 5 % 250 mL IVPB, 30 mmol, Intravenous, Daily PRN, Tomi Likens, APRN - CNP, Stopped at 01/28/18 1018  ???  celecoxib (CELEBREX) capsule 100 mg, 100 mg, Oral, BID, Megan M Earhart, APRN - CNP, 100 mg at 02/03/18 0841  ???  insulin lispro (HUMALOG) injection pen 0-18 Units, 0-18 Units, Subcutaneous, TID WC, Megan M Earhart, APRN - CNP, 3 Units at 02/02/18 1613  ???   insulin lispro (HUMALOG) injection pen 0-9 Units, 0-9 Units, Subcutaneous, Nightly, Megan M Earhart, APRN - CNP, 5 Units at 02/02/18 2126  ???  glucose (GLUTOSE) 40 % oral gel 15 g, 15 g, Oral, PRN, Megan M Earhart, APRN - CNP  ???  dextrose 50 % solution 12.5 g, 12.5 g, Intravenous, PRN, Wayland Salinas, APRN - CNP  ???  glucagon (rDNA) injection 1 mg, 1 mg, Intramuscular, PRN, Megan M Earhart, APRN - CNP  ???  dextrose 5 % solution, 100 mL/hr, Intravenous, PRN, Wayland Salinas, APRN - CNP  ???  potassium chloride 20 mEq/50 mL IVPB (Central Line), 20 mEq, Intravenous, PRN, Megan M Earhart, APRN - CNP  ???  magnesium sulfate 2 g in 50 mL IVPB premix, 2 g, Intravenous, PRN, Wayland Salinas, APRN - CNP, Stopped at 01/29/18 0946  ???  [COMPLETED] anidulafungin (ERAXIS) 200 mg in dextrose 5 % 260 mL IVPB, 200 mg, Intravenous, Once, Stopped at 01/25/18 1510 **AND** anidulafungin (ERAXIS) 100 mg in dextrose 5 % 130 mL IVPB, 100 mg, Intravenous, Q24H, Megan M Earhart, APRN - CNP, Stopped at 02/02/18 1325  ???  0.9 % sodium chloride infusion, , Intravenous, Continuous, Megan M Earhart, APRN - CNP, Last Rate: 20 mL/hr at 02/01/18 1903  ???  LORazepam (ATIVAN) injection 1 mg, 1 mg, Intravenous, Q6H PRN, Charleen Kirks, MD, 1 mg at 01/25/18 0408  ???  albuterol (PROVENTIL) nebulizer solution 2.5 mg, 2.5 mg, Nebulization, Q4H PRN, Harlene Salts, MD, 2.5 mg at 02/01/18 1951  ???  atorvastatin (LIPITOR) tablet 10 mg, 10 mg, Oral, Nightly, Verne Grain., DO, 10 mg at 02/02/18 2112  ???  bisacodyl (DULCOLAX) EC tablet 10 mg, 10 mg, Oral, Daily PRN, Verne Grain., DO  ???  calcium elemental (OSCAL) tablet 500 mg, 500 mg, Oral, BID, Verne Grain., DO, 500 mg at 02/03/18 9518  ???  fentaNYL (DURAGESIC) 50 MCG/HR 1 patch, 1 patch, Transdermal, Q72H, Verne Grain., DO, 1 patch at 01/31/18 2129  ???  gabapentin (NEURONTIN) capsule 300 mg, 300 mg, Oral, TID, Verne Grain., DO, 300 mg at 02/03/18 8416  ???  ondansetron (ZOFRAN) tablet 8 mg, 8  mg, Oral, Q6H PRN, Merlyn Albert  Drucilla Schmidt., DO, 8 mg at 01/29/18 2039  ???  oxyCODONE-acetaminophen (PERCOCET) 7.5-325 MG per tablet 1 tablet, 1 tablet, Oral, Q4H PRN, Verne Grain., DO, 1 tablet at 02/02/18 2119  ???  pantoprazole (PROTONIX) tablet 40 mg, 40 mg, Oral, QAM AC, Verne Grain., DO, 40 mg at 02/03/18 6644  ???  prochlorperazine (COMPAZINE) tablet 10 mg, 10 mg, Oral, Q6H PRN, Verne Grain., DO  ???  sennosides-docusate sodium (SENOKOT-S) 8.6-50 MG tablet 2 tablet, 2 tablet, Oral, Daily, Verne Grain., DO, 2 tablet at 02/03/18 0841  ???  sertraline (ZOLOFT) tablet 50 mg, 50 mg, Oral, Daily, Verne Grain., DO, 50 mg at 02/03/18 0347  ???  valACYclovir (VALTREX) tablet 500 mg, 500 mg, Oral, BID, Verne Grain., DO, 500 mg at 02/03/18 4259  ???  meropenem (MERREM) 1 g in sodium chloride 0.9 % 100 mL IVPB (mini-bag), 1 g, Intravenous, Q8H, Verne Grain., DO, Stopped at 02/03/18 0240  ???  sodium chloride flush 0.9 % injection 10 mL, 10 mL, Intravenous, 2 times per day, Verne Grain., DO, 10 mL at 02/03/18 5638  ???  sodium chloride flush 0.9 % injection 10 mL, 10 mL, Intravenous, PRN, Verne Grain., DO  ???  potassium chloride 20 mEq/50 mL IVPB (Central Line), 20 mEq, Intravenous, PRN, Verne Grain., DO  ???  magnesium sulfate 4 g in 100 mL IVPB premix, 4 g, Intravenous, PRN, Verne Grain., DO  ???  magnesium hydroxide (MILK OF MAGNESIA) 400 MG/5ML suspension 30 mL, 30 mL, Oral, Daily PRN, Verne Grain., DO  ???  Saline Mouthwash 15 mL, 15 mL, Swish & Spit, 4x Daily AC & HS, Verne Grain., DO, 15 mL at 02/02/18 1616  ???  Saline Mouthwash 15 mL, 15 mL, Swish & Spit, Q4H PRN, Verne Grain., DO  ???  alteplase (CATHFLO) injection 2 mg, 2 mg, Intracatheter, PRN, Verne Grain., DO  ???  tiotropium [facility]) inhalation capsule 18 mcg, 18 mcg, Inhalation, Daily, Verne Grain., DO, 18 mcg at 02/02/18 0845  ???  budesonide (PULMICORT) nebulizer suspension 250 mcg, 0.25 mg,  Nebulization, BID, 250 mcg at 02/02/18 2139 **AND** formoterol (PERFOROMIST) nebulizer solution 20 mcg, 20 mcg, Nebulization, BID, Verne Grain., DO, 20 mcg at 02/02/18 2142  ???  LORazepam (ATIVAN) tablet 1 mg, 1 mg, Oral, Q4H PRN, Verne Grain., DO, 1 mg at 02/02/18 2119  ???  levalbuterol (XOPENEX) nebulizer solution 1.25 mg, 1.25 mg, Nebulization, Q4H WA, Verne Grain., DO, 1.25 mg at 02/02/18 2139    A/P:  62 y.o. admitted with severe SOB.  -Acute pericarditis  -Multiple myeloma  -COPD  -Neutropenia  -Pneumonia with "tree in bud" appearance of CT chest  -Hyperlipidemia  -parox A fib with RVR    Recs:  -Amiodarone 200 mg daily  -Cont metoprolol and dilt. We added metop on this morning and tolerating ok so far.  -If bp tolerates, increase dilt tomorrow    Roderic Palau A. Bunnie Philips, MD, [facility], [facility]    Back in a fib.    PE:  Blood pressure 123/84, pulse 69, temperature 97.7 ??F (36.5 ??C), temperature source Oral, resp. rate 12, height 5' 9"  (1.753 m), weight 194 lb 14.2 oz (88.4 kg), SpO2 96 %.  General (appearance):  Alert and cooperative.   Eyes: Anicteric  Neck: Supple. No JVD  Ears/Nose/Mouth/Thorat: No  cyanosis  CV: Irreg irreg. No obvious m/r/g  Respiratory:  + mild end-expiratory wheezing  GI: Abd s/nt/nd. No peritoneal signs  Skin: Warm, dry. No rashes  Neuro/Psych: Alert and oriented x 3. Appropriate behavior  Ext:  No c/c. Mild edema  Pulses:  2+ radial    Lab Results   Component Value Date    WBC 3.7 (L) 02/03/2018    HGB 8.1 (L) 02/03/2018    HCT 24.0 (L) 02/03/2018    MCV 92.1 02/03/2018    PLT 39 (L) 02/03/2018     Lab Results   Component Value Date    NA 132 (L) 02/03/2018    K 4.5 02/03/2018    CL 96 (L) 02/03/2018    CO2 27 02/03/2018    BUN 25 (H) 02/03/2018    CREATININE 0.6 (L) 02/03/2018    GLUCOSE 209 (H) 02/03/2018    CALCIUM 8.4 02/03/2018    PROT 5.0 (L) 02/02/2018    LABALBU 2.8 (L) 02/02/2018    BILITOT 0.3 02/02/2018    ALKPHOS 100 02/02/2018    AST 17 02/02/2018    ALT 53 (H)  02/02/2018    LABGLOM >60 02/03/2018    GFRAA >60 02/03/2018    AGRATIO 1.0 (L) 01/22/2018    GLOB 3.1 01/22/2018     Lab Results   Component Value Date    INR 1.51 (H) 02/03/2018    INR 1.12 01/31/2018    INR 1.11 01/27/2018    PROTIME 17.2 (H) 02/03/2018    PROTIME 12.8 01/31/2018    PROTIME 12.7 01/27/2018     Lab Results   Component Value Date    TROPONINI <0.01 01/22/2018     Lab Results   Component Value Date    ALT 53 (H) 02/02/2018    AST 17 02/02/2018    ALKPHOS 100 02/02/2018    BILITOT 0.3 02/02/2018     Lab Results   Component Value Date    ALT 53 (H) 02/02/2018    AST 17 02/02/2018    ALKPHOS 100 02/02/2018    BILITOT 0.3 02/02/2018       01/21/2018 TTE: EF 50-55%. No def RWMA (inferior and inferoseptum was normal). No sig valve regurgitation or stenosis.    01/22/18 ECGs reviewed. Earlier ecg seemed to demonstrate acute inferior MI but Tns negative and further ecgs showed evolution other ST segments suggesting acute pericarditis.    01/21/2018 CTA Chest: No PE. Tree-in-bud bilateral opacities suggesting atypical infection/inflammation. Enlarged L axillary notes unchanged. Ribs, spine, and scapula lesions suggesting MM/bony mets. No sig pericardial effusion noted.    01/22/18 CXR: Hyperinflated lungs. No definite acute consolidation      Current Facility-Administered Medications:   ???  0.9 % sodium chloride bolus, 250 mL, Intravenous, Once, Harlene Salts, MD  ???  0.9 % sodium chloride bolus, 250 mL, Intravenous, Once, Neville Route, MD  ???  magnesium sulfate 2 g in 50 mL IVPB premix, 2 g, Intravenous, Once, Rodrigo Ran, APRN - CNP  ???  metoprolol tartrate (LOPRESSOR) tablet 50 mg, 50 mg, Oral, BID, Glennon Hamilton, MD, 50 mg at 02/03/18 5427  ???  predniSONE (DELTASONE) tablet 25 mg, 25 mg, Oral, BID, Jody M Moehring, APRN - CNP, 25 mg at 02/03/18 0623  ???  amiodarone (CORDARONE) tablet 200 mg, 200 mg, Oral, Daily, Krystal Clark, MD, 200 mg at 02/03/18 7628  ???  guaiFENesin (MUCINEX) extended release  tablet 600 mg, 600 mg, Oral, BID, Archer Asa  Percell Miller, MD, 600 mg at 02/03/18 0841  ???  acetaminophen (TYLENOL) tablet 650 mg, 650 mg, Oral, PRN, Jody M Moehring, APRN - CNP, 650 mg at 02/03/18 0554  ???  diphenhydrAMINE (BENADRYL) tablet 25 mg, 25 mg, Oral, PRN, Jody M Moehring, APRN - CNP, 25 mg at 02/03/18 0554  ???  diltiazem (CARDIZEM CD) extended release capsule 240 mg, 240 mg, Oral, Daily, Krystal Clark, MD, 240 mg at 02/03/18 0841  ???  potassium phosphate 30 mmol in dextrose 5 % 250 mL IVPB, 30 mmol, Intravenous, Daily PRN, Tomi Likens, APRN - CNP, Stopped at 01/28/18 1018  ???  celecoxib (CELEBREX) capsule 100 mg, 100 mg, Oral, BID, Megan M Earhart, APRN - CNP, 100 mg at 02/03/18 0841  ???  insulin lispro (HUMALOG) injection pen 0-18 Units, 0-18 Units, Subcutaneous, TID WC, Megan M Earhart, APRN - CNP, 3 Units at 02/02/18 1613  ???  insulin lispro (HUMALOG) injection pen 0-9 Units, 0-9 Units, Subcutaneous, Nightly, Megan M Earhart, APRN - CNP, 5 Units at 02/02/18 2126  ???  glucose (GLUTOSE) 40 % oral gel 15 g, 15 g, Oral, PRN, Megan M Earhart, APRN - CNP  ???  dextrose 50 % solution 12.5 g, 12.5 g, Intravenous, PRN, Wayland Salinas, APRN - CNP  ???  glucagon (rDNA) injection 1 mg, 1 mg, Intramuscular, PRN, Megan M Earhart, APRN - CNP  ???  dextrose 5 % solution, 100 mL/hr, Intravenous, PRN, Wayland Salinas, APRN - CNP  ???  potassium chloride 20 mEq/50 mL IVPB (Central Line), 20 mEq, Intravenous, PRN, Megan M Earhart, APRN - CNP  ???  magnesium sulfate 2 g in 50 mL IVPB premix, 2 g, Intravenous, PRN, Wayland Salinas, APRN - CNP, Stopped at 01/29/18 0946  ???  [COMPLETED] anidulafungin (ERAXIS) 200 mg in dextrose 5 % 260 mL IVPB, 200 mg, Intravenous, Once, Stopped at 01/25/18 1510 **AND** anidulafungin (ERAXIS) 100 mg in dextrose 5 % 130 mL IVPB, 100 mg, Intravenous, Q24H, Megan M Earhart, APRN - CNP, Stopped at 02/02/18 1325  ???  0.9 % sodium chloride infusion, , Intravenous, Continuous, Megan M Earhart, APRN - CNP, Last Rate:  20 mL/hr at 02/01/18 1903  ???  LORazepam (ATIVAN) injection 1 mg, 1 mg, Intravenous, Q6H PRN, Charleen Kirks, MD, 1 mg at 01/25/18 0408  ???  albuterol (PROVENTIL) nebulizer solution 2.5 mg, 2.5 mg, Nebulization, Q4H PRN, Harlene Salts, MD, 2.5 mg at 02/01/18 1951  ???  atorvastatin (LIPITOR) tablet 10 mg, 10 mg, Oral, Nightly, Verne Grain., DO, 10 mg at 02/02/18 2112  ???  bisacodyl (DULCOLAX) EC tablet 10 mg, 10 mg, Oral, Daily PRN, Verne Grain., DO  ???  calcium elemental (OSCAL) tablet 500 mg, 500 mg, Oral, BID, Verne Grain., DO, 500 mg at 02/03/18 1478  ???  fentaNYL (DURAGESIC) 50 MCG/HR 1 patch, 1 patch, Transdermal, Q72H, Verne Grain., DO, 1 patch at 01/31/18 2129  ???  gabapentin (NEURONTIN) capsule 300 mg, 300 mg, Oral, TID, Verne Grain., DO, 300 mg at 02/03/18 2956  ???  ondansetron (ZOFRAN) tablet 8 mg, 8 mg, Oral, Q6H PRN, Verne Grain., DO, 8 mg at 01/29/18 2039  ???  oxyCODONE-acetaminophen (PERCOCET) 7.5-325 MG per tablet 1 tablet, 1 tablet, Oral, Q4H PRN, Verne Grain., DO, 1 tablet at 02/02/18 2119  ???  pantoprazole (PROTONIX) tablet 40 mg, 40 mg, Oral, QAM AC, Verne Grain., DO, 40 mg at  02/03/18 3716  ???  prochlorperazine (COMPAZINE) tablet 10 mg, 10 mg, Oral, Q6H PRN, Verne Grain., DO  ???  sennosides-docusate sodium (SENOKOT-S) 8.6-50 MG tablet 2 tablet, 2 tablet, Oral, Daily, Verne Grain., DO, 2 tablet at 02/03/18 0841  ???  sertraline (ZOLOFT) tablet 50 mg, 50 mg, Oral, Daily, Verne Grain., DO, 50 mg at 02/03/18 9678  ???  valACYclovir (VALTREX) tablet 500 mg, 500 mg, Oral, BID, Verne Grain., DO, 500 mg at 02/03/18 9381  ???  meropenem (MERREM) 1 g in sodium chloride 0.9 % 100 mL IVPB (mini-bag), 1 g, Intravenous, Q8H, Verne Grain., DO, Stopped at 02/03/18 0240  ???  sodium chloride flush 0.9 % injection 10 mL, 10 mL, Intravenous, 2 times per day, Verne Grain., DO, 10 mL at 02/03/18 0175  ???  sodium chloride flush 0.9 % injection 10 mL,  10 mL, Intravenous, PRN, Verne Grain., DO  ???  potassium chloride 20 mEq/50 mL IVPB (Central Line), 20 mEq, Intravenous, PRN, Verne Grain., DO  ???  magnesium sulfate 4 g in 100 mL IVPB premix, 4 g, Intravenous, PRN, Verne Grain., DO  ???  magnesium hydroxide (MILK OF MAGNESIA) 400 MG/5ML suspension 30 mL, 30 mL, Oral, Daily PRN, Verne Grain., DO  ???  Saline Mouthwash 15 mL, 15 mL, Swish & Spit, 4x Daily AC & HS, Verne Grain., DO, 15 mL at 02/02/18 1616  ???  Saline Mouthwash 15 mL, 15 mL, Swish & Spit, Q4H PRN, Verne Grain., DO  ???  alteplase (CATHFLO) injection 2 mg, 2 mg, Intracatheter, PRN, Verne Grain., DO  ???  tiotropium [facility]) inhalation capsule 18 mcg, 18 mcg, Inhalation, Daily, Verne Grain., DO, 18 mcg at 02/02/18 0845  ???  budesonide (PULMICORT) nebulizer suspension 250 mcg, 0.25 mg, Nebulization, BID, 250 mcg at 02/02/18 2139 **AND** formoterol (PERFOROMIST) nebulizer solution 20 mcg, 20 mcg, Nebulization, BID, Verne Grain., DO, 20 mcg at 02/02/18 2142  ???  LORazepam (ATIVAN) tablet 1 mg, 1 mg, Oral, Q4H PRN, Verne Grain., DO, 1 mg at 02/02/18 2119  ???  levalbuterol (XOPENEX) nebulizer solution 1.25 mg, 1.25 mg, Nebulization, Q4H WA, Verne Grain., DO, 1.25 mg at 02/02/18 2139    A/P:  62 y.o. admitted with severe SOB.  -Acute pericarditis  -Multiple myeloma  -COPD  -Neutropenia  -Pneumonia with "tree in bud" appearance of CT chest  -Hyperlipidemia  -parox A fib with RVR    Recs:  -Amiodarone 200 mg daily and oral metoprolol  -Dilt  -If bronch well tolerated, may increase the dilt tomorrow. Was going to today but may get hypotensive with procedure today    Roderic Palau A. Bunnie Philips, MD, [facility], [facility]

## 2018-02-03 NOTE — Plan of Care (Signed)
Problem: Falls - Risk of:  Goal: Will remain free from falls  Description  Will remain free from falls  02/03/2018 0218 by Loney Laurence, RN  Outcome: Ongoing  Pt up X1 assist with walker. Bed in lowest position, wheels locked, side rails up 2/4. Possessions and call light within reach; pt uses call light appropriately. Will continue to monitor.      Problem: Pain:  Goal: Pain level will decrease  Description  Pain level will decrease  02/03/2018 0218 by Loney Laurence, RN  Outcome: Ongoing   Patient continues with chronic lower back pain. PRN pain medication given, pt. Satisfied. Will continue to monitor.     Problem: Cardiac:  Goal: Ability to maintain vital signs within normal range will improve  Description  Ability to maintain vital signs within normal range will improve  02/03/2018 0218 by Loney Laurence, RN  Outcome: Ongoing   Patient HR stabilized in 70's and in normal sinus rhythm.     Problem: Bleeding:  Goal: Will show no signs and symptoms of excessive bleeding  Description  Will show no signs and symptoms of excessive bleeding  02/03/2018 0218 by Loney Laurence, RN  Outcome: Ongoing     Patient's hemoglobin this AM:   Recent Labs     02/03/18  0400   HGB 8.1*     Patient's platelet count this AM:   Recent Labs     02/03/18  0400   PLT 14*    Thrombocytopenia Precautions in place.  Patient showing no signs or symptoms of active bleeding.  Patient transfused blood products per orders - see flowsheet.  Patient verbalizes understanding of all instructions. Will continue to assess and implement POC. Call light within reach and hourly rounding in place.     Problem: PROTECTIVE PRECAUTIONS  Goal: Patient will remain free of nosocomial Infections  02/03/2018 0218 by Loney Laurence, RN  Outcome: Ongoing   Pt remains in neutropenic precautions per floor policy. Pt, visitors, and staff noted to be following precautions appropriately. Handwashing in place; pt wearing mask in hallway per protocol. Pt in private room. Low microbial diet in  place. Will continue to monitor.

## 2018-02-04 LAB — CBC WITH AUTO DIFFERENTIAL
Bands Relative: 4 % (ref 0–7)
Basophils %: 0 %
Basophils Absolute: 0 10*3/uL (ref 0.0–0.2)
Eosinophils %: 0 %
Eosinophils Absolute: 0 10*3/uL (ref 0.0–0.6)
Hematocrit: 24.2 % — ABNORMAL LOW (ref 40.5–52.5)
Hemoglobin: 8.3 g/dL — ABNORMAL LOW (ref 13.5–17.5)
Lymphocytes %: 6 %
Lymphocytes Absolute: 0.2 10*3/uL — ABNORMAL LOW (ref 1.0–5.1)
MCH: 31.7 pg (ref 26.0–34.0)
MCHC: 34.2 g/dL (ref 31.0–36.0)
MCV: 92.7 fL (ref 80.0–100.0)
MPV: 7.6 fL (ref 5.0–10.5)
Monocytes %: 1 %
Monocytes Absolute: 0 10*3/uL (ref 0.0–1.3)
Neutrophils %: 89 %
Neutrophils Absolute: 3.7 10*3/uL (ref 1.7–7.7)
Platelets: 55 10*3/uL — ABNORMAL LOW (ref 135–450)
RBC: 2.61 M/uL — ABNORMAL LOW (ref 4.20–5.90)
RDW: 17.7 % — ABNORMAL HIGH (ref 12.4–15.4)
WBC: 4 10*3/uL (ref 4.0–11.0)

## 2018-02-04 LAB — HEPATIC FUNCTION PANEL
ALT: 47 U/L — ABNORMAL HIGH (ref 10–40)
AST: 18 U/L (ref 15–37)
Albumin: 2.7 g/dL — ABNORMAL LOW (ref 3.4–5.0)
Alkaline Phosphatase: 107 U/L (ref 40–129)
Bilirubin, Direct: <0.2 mg/dL (ref 0.0–0.3)
Total Bilirubin: <0.2 mg/dL (ref 0.0–1.0)
Total Protein: 5.1 g/dL — ABNORMAL LOW (ref 6.4–8.2)

## 2018-02-04 LAB — BASIC METABOLIC PANEL
Anion Gap: 9 (ref 3–16)
BUN: 21 mg/dL — ABNORMAL HIGH (ref 7–20)
CO2: 29 mmol/L (ref 21–32)
Calcium: 8.2 mg/dL — ABNORMAL LOW (ref 8.3–10.6)
Chloride: 97 mmol/L — ABNORMAL LOW (ref 99–110)
Creatinine: 0.5 mg/dL — ABNORMAL LOW (ref 0.9–1.3)
GFR African American: >60 (ref >60)
GFR Non-African American: >60 (ref >60)
Glucose: 267 mg/dL — ABNORMAL HIGH (ref 70–99)
Potassium: 4.4 mmol/L (ref 3.5–5.1)
Sodium: 135 mmol/L — ABNORMAL LOW (ref 136–145)

## 2018-02-04 LAB — MAGNESIUM: Magnesium: 2 mg/dL (ref 1.80–2.40)

## 2018-02-04 LAB — POCT GLUCOSE
POC Glucose: 204 mg/dl — ABNORMAL HIGH (ref 70–99)
POC Glucose: 280 mg/dl — ABNORMAL HIGH (ref 70–99)
POC Glucose: 173 mg/dl — ABNORMAL HIGH (ref 70–99)
POC Glucose: 158 mg/dl — ABNORMAL HIGH (ref 70–99)

## 2018-02-04 LAB — LACTATE DEHYDROGENASE: LD: 200 U/L — ABNORMAL HIGH (ref 100–190)

## 2018-02-04 LAB — URIC ACID: Uric Acid, Serum: 1.9 mg/dL — ABNORMAL LOW (ref 3.5–7.2)

## 2018-02-04 LAB — PHOSPHORUS: Phosphorus: 1.3 mg/dL — ABNORMAL LOW (ref 2.5–4.9)

## 2018-02-04 MED ORDER — K PHOS MONO-SOD PHOS DI & MONO 155-852-130 MG PO TABS
155-852-130 MG | Freq: Two times a day (BID) | ORAL | Status: DC
Start: 2018-02-04 — End: 2018-02-08
  Administered 2018-02-04: 14:00:00 2 mg via ORAL
  Administered 2018-02-05: 05:00:00 500 mg via ORAL
  Administered 2018-02-05: 14:00:00 2 mg via ORAL
  Administered 2018-02-06: 500 mg via ORAL
  Administered 2018-02-06 – 2018-02-07 (×2): 2 mg via ORAL
  Administered 2018-02-07: 01:00:00 500 mg via ORAL
  Administered 2018-02-08 (×2): 2 mg via ORAL

## 2018-02-04 MED ORDER — SODIUM CHLORIDE 3 % IN NEBU
3 % | Freq: Two times a day (BID) | RESPIRATORY_TRACT | Status: DC
Start: 2018-02-04 — End: 2018-02-08
  Administered 2018-02-05 – 2018-02-08 (×7): 15 mL via RESPIRATORY_TRACT

## 2018-02-04 MED ORDER — ALBUTEROL SULFATE (2.5 MG/3ML) 0.083% IN NEBU
Freq: Two times a day (BID) | RESPIRATORY_TRACT | Status: DC
Start: 2018-02-04 — End: 2018-02-04

## 2018-02-04 MED FILL — AMIODARONE HCL 200 MG PO TABS: 200 mg | ORAL | Qty: 1

## 2018-02-04 MED FILL — SENNOSIDES-DOCUSATE SODIUM 8.6-50 MG PO TABS: 8.6-50 mg | ORAL | Qty: 2

## 2018-02-04 MED FILL — LIPITOR 20 MG PO TABS: 20 mg | ORAL | Qty: 1

## 2018-02-04 MED FILL — METOPROLOL TARTRATE 50 MG PO TABS: 50 mg | ORAL | Qty: 1

## 2018-02-04 MED FILL — CELEBREX 100 MG PO CAPS: 100 mg | ORAL | Qty: 1

## 2018-02-04 MED FILL — ERAXIS 100 MG IV SOLR: 100 mg | INTRAVENOUS | Qty: 30

## 2018-02-04 MED FILL — PHOSPHA 250 NEUTRAL 155-852-130 MG PO TABS: 155-852-130 mg | ORAL | Qty: 2

## 2018-02-04 MED FILL — POTASSIUM PHOSPHATES 45 MMOLE/15ML IV SOLN: 45 MMOLE/15ML | INTRAVENOUS | Qty: 10

## 2018-02-04 MED FILL — OYSTER SHELL CALCIUM 500 MG PO TABS: 500 mg | ORAL | Qty: 1

## 2018-02-04 MED FILL — OXYCODONE-ACETAMINOPHEN 7.5-325 MG PO TABS: 7.5-325 mg | ORAL | Qty: 1

## 2018-02-04 MED FILL — SODIUM CHLORIDE 0.9 % IV SOLN: 0.9 % | INTRAVENOUS | Qty: 200

## 2018-02-04 MED FILL — FENTANYL 50 MCG/HR TD PT72: 50 ug/h | TRANSDERMAL | Qty: 1

## 2018-02-04 MED FILL — MUCINEX 600 MG PO TB12: 600 mg | ORAL | Qty: 1

## 2018-02-04 MED FILL — GABAPENTIN 300 MG PO CAPS: 300 mg | ORAL | Qty: 1

## 2018-02-04 MED FILL — SODIUM CHLORIDE 0.9 % IV SOLN: 0.9 % | INTRAVENOUS | Qty: 250

## 2018-02-04 MED FILL — DILTIAZEM HCL ER COATED BEADS 240 MG PO CP24: 240 mg | ORAL | Qty: 1

## 2018-02-04 MED FILL — MEROPENEM 1 G IV SOLR: 1 g | INTRAVENOUS | Qty: 1

## 2018-02-04 MED FILL — VALACYCLOVIR HCL 500 MG PO TABS: 500 mg | ORAL | Qty: 1

## 2018-02-04 MED FILL — PREDNISONE 5 MG PO TABS: 5 mg | ORAL | Qty: 1

## 2018-02-04 MED FILL — PANTOPRAZOLE SODIUM 40 MG PO TBEC: 40 mg | ORAL | Qty: 1

## 2018-02-04 MED FILL — SODIUM CHLORIDE 3 % IN NEBU: 3 % | RESPIRATORY_TRACT | Qty: 15

## 2018-02-04 MED FILL — SERTRALINE HCL 50 MG PO TABS: 50 mg | ORAL | Qty: 1

## 2018-02-04 MED FILL — LORAZEPAM 1 MG PO TABS: 1 mg | ORAL | Qty: 1

## 2018-02-04 NOTE — Progress Notes (Signed)
[facility] Progress Note    02/04/2018     FRANCISCA HARBUCK    MRN: [id number]    DOB: April 18, 1956    SUBJECTIVE: Bronch done yesterday and results pending.  No new complaints today.      ECOG PS:  (2) Ambulatory and capable of self care, unable to carry out work activity, up and about > 50% or waking hours    Isolation: None    Medications    Scheduled Meds:  ??? sodium chloride (Inhalant)  15 mL Nebulization BID   ??? albuterol  2.5 mg Nebulization BID   ??? sodium chloride  250 mL Intravenous Once   ??? sodium chloride  250 mL Intravenous Once   ??? metoprolol tartrate  50 mg Oral BID   ??? predniSONE  25 mg Oral BID   ??? amiodarone  200 mg Oral Daily   ??? guaiFENesin  600 mg Oral BID   ??? diltiazem  240 mg Oral Daily   ??? celecoxib  100 mg Oral BID   ??? insulin lispro  0-18 Units Subcutaneous TID WC   ??? insulin lispro  0-9 Units Subcutaneous Nightly   ??? anidulafungin  100 mg Intravenous Q24H   ??? atorvastatin  10 mg Oral Nightly   ??? calcium elemental  500 mg Oral BID   ??? fentaNYL  1 patch Transdermal Q72H   ??? gabapentin  300 mg Oral TID   ??? pantoprazole  40 mg Oral QAM AC   ??? sennosides-docusate sodium  2 tablet Oral Daily   ??? sertraline  50 mg Oral Daily   ??? valACYclovir  500 mg Oral BID   ??? meropenem  1 g Intravenous Q8H   ??? sodium chloride flush  10 mL Intravenous 2 times per day   ??? Saline Mouthwash  15 mL Swish & Spit 4x Daily AC & HS   ??? tiotropium  18 mcg Inhalation Daily   ??? budesonide  0.25 mg Nebulization BID    And   ??? formoterol  20 mcg Nebulization BID   ??? levalbuterol  1.25 mg Nebulization Q4H WA     Continuous Infusions:  ??? dextrose     ??? sodium chloride 20 mL/hr at 02/04/18 0620     PRN Meds:.acetaminophen, diphenhydrAMINE, potassium phosphate IVPB, glucose, dextrose, glucagon (rDNA), dextrose, potassium chloride, magnesium sulfate, LORazepam, albuterol, bisacodyl, ondansetron, oxyCODONE-acetaminophen, prochlorperazine, sodium chloride flush, potassium chloride, magnesium sulfate, magnesium hydroxide, Saline Mouthwash,  alteplase, LORazepam    ROS:  As noted above, otherwise remainder of 10-point ROS negative    Physical Exam:     I&O:      Intake/Output Summary (Last 24 hours) at 02/04/2018 0950  Last data filed at 02/04/2018 0616  Gross per 24 hour   Intake 2307 ml   Output 1550 ml   Net 757 ml       Vital Signs:  BP 106/67    Pulse 75    Temp 97.6 ??F (36.4 ??C) (Oral)    Resp 15    Ht 5' 9" (1.753 m)    Wt 195 lb 15.8 oz (88.9 kg)    SpO2 93%    BMI 28.94 kg/m??     Weight:    Wt Readings from Last 3 Encounters:   02/04/18 195 lb 15.8 oz (88.9 kg)   01/18/18 194 lb 9.6 oz (88.3 kg)   01/10/18 194 lb 12.8 oz (88.4 kg)         ??  General: Awake, alert and oriented.  HEENT:??normocephalic, alopecia,??PERRL, no scleral erythema or icterus, Oral mucosa moist and intact  LYMPH: ??Left axillary lymphadenopathy??  NECK: supple without palpable adenopathy  BACK: Straight negative CVAT  SKIN:??warm dry and intact without lesions rashes or masses  CHEST:??rhonchi w/ insp/exp wheeze,??without use of accessory muscles  CV: Normal S1 S2, irregular, no MRG  ABD:??NT ND normoactive BS, no palpable masses or hepatosplenomegaly  EXTREMITIES:??without edema, denies calf tenderness  NEURO: CN II - XII grossly intact  CATHETER:??Right IJ PAC (06/21/17, [facility])??- CDI        Data    CBC:   Recent Labs     02/02/18  0345 02/03/18  0400 02/03/18  0725 02/03/18  0940 02/04/18  0344   WBC 3.4* 3.7*  --   --  4.0   HGB 8.5* 8.1*  --   --  8.3*   HCT 25.2* 24.0*  --   --  24.2*   MCV 93.0 92.1  --   --  92.7   PLT 13* 14* 39* 75* 55*     BMP/Mag:  Recent Labs     02/02/18  0345 02/03/18  0410 02/04/18  0344   NA 133* 132* 135*   K 4.7 4.5 4.4   CL 98* 96* 97*   CO2 _0 PHOS 2.3* 2.2* 1.3*   BUN 22* 25* 21*   CREATININE 0.5* 0.6* 0.5*   MG 2.00 1.80 2.00     LIVP:   Recent Labs     02/02/18  0345 02/04/18  0344   AST 17 18   ALT 53* 47*   BILIDIR <0.2 <0.2   BILITOT 0.3 <0.2   ALKPHOS 100 107     Coags:   Recent Labs     02/03/18  0400   PROTIME 17.2*   INR 1.51*    APTT 46.7*     Uric Acid   Recent Labs     02/02/18  0345 02/04/18  0344   LABURIC 1.8* 1.9*       Diagnostics:  1. ??PET scan (01/10/18):    ??    2.  CTA Chest (525/19):  1. ??No pulmonary embolism.  2. ??Patchy groundglass opacities and tree-in-bud are seen throughout the   lungs which are new compared to prior examination. ??This most likely   represents atypical infection/inflammation. ??Clinical correlation is   recommended.  3. ??Paraseptal and centrilobular emphysematous change of the lungs.  4. ??Enlarged left axillary lymph nodes are not significantly changed   compared to PET/CT on 01/10/2018. ??This is worrisome for nodal metastasis.  5. ??Scattered lucent lesions are seen throughout the ribs, spine, and   scapula. ??Findings may represent multiple myeloma or osseous metastasis.    3.  Echocardiogram (01/23/18):  ??Technically difficult examination.  ??Normal left ventricle size, wall thickness, and systolic function with an  ??estimated ejection fraction of 50-55%. No obvious regional wall motion  ??abnormalities are seen (inferior and inferoseptal walls do appear normal, as  ??clinically questioned). Diastolic filling parameters suggests normal  ??diastolic function.  ??Aortic valve appears sclerotic but opens adequately.  ??Possible small/organized pericardial effusion though fat pad cannot be ruled  ??out.    4.  CT Chest (01/31/18):  Stable innumerable pulmonary nodules bilaterally favoring metastatic disease.    Minimal decrease in size of dominant left axillary lymph node Jan 27, 2018    Stable osteolytic lesions of the axial skeleton, likely metastatic.    Myeloma Labs (01/03/18):  SPEP:????reveals that M2, previously characterized  as monoclonal IgG kappa in  mid-to-slow gamma is 1.0 gm/dL, greater than the 0.2 gm/dL observed on 08 Nov 2017.??  SIFE:????Serum immunofixation electrophoresis reveals persistent M2 in mid-to-slow gamma, a monoclonal IgG kappa. ??A recent M-spike, M1, was minor monoclonal IgG lambda in mid-gamma;  M1 continues to be absent.  SFLC:  Kappa:  30.80, previously (11/08/17) -??10.40  Lambda:  1.46, previously (11/08/17) -??5.97  Free Kappa Lambda Ratio:  21.10, previously (11/08/17) -??1.74  Quantitative Immunoglobulins:  IgG:??  1480, previously (11/08/17) - 552  IgA:??  <26, previously (11/08/17) -??30  IgM:??  <20    Myeloma Labs (01/27/18):  Serum Light Chains: Pending    SPE/IFE:??M-protein persists in the gamma region on serum protein electrophoresis. The amount of this protein is 0.5 gm/dL     Quantitative Immunoglobulins:  IgG:??  791  IgA:??  17  IgM:??  11  ??  PROBLEM LIST: ??????????   ????  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 ??COPD   9.????Influenza A??(10/12/17)  10.  COPD Exacerbation (12/2017)  11.  Pericarditis (12/2017)  12.  A. Fib w/ RVR (12/2017)  ????  TREATMENT: ??????????   ??  1. Rad Tx to T5-7, T10-L3, Right Scapula - 3000 cGy - Dr. Jamas Lav 03/20/16-04/01/16  2. RVD x1 03/20/16 - discontinued d/t rash   3. Velcade/Pomalyst/Dex x3 cycles 04/23/16-06/17/16 - discontinued d/t reaction to pomalyst  4. Velcade/Dex x 1 cycle 06/26/16 (last dose of dexamethasone 07/22/16  5. High-dose melphalan followed by administration of PBSCs 2.36 x10^6 cd34cells/kg on 08/28/16  6. Maintenance Revlimid 64m daily (12/2016-04/23/17)  7. Dexamethasone 430mdaily (04/24/17)  8. DCEP   Cycle #1 - 04/26/17  Cycle #2 - 05/25/17 - excellent response  9.??Dara/Velcade/Dex (C1D1 - 06/22/17) - BMBx ??10/18/17 - CR  Cycles 7-8 (21 day cycle)  - Dex 20 mg po D4,5.8,9,11 and 12  - Dara 16 mg/Kg D1 only with Dex 20 mg IV  - Velcade 1.3 mg/M2 D1,4,8 and 11  Cycle 9 and beyond (maintenance, 11/08/17??- 01/03/18)  Dara 16 mg/Kg Q 28 days  Dex 12 mg IV D1  Velcade Q 2 weeks??  10. DCEP   Cycle #1 - 01/13/18  ??  ASSESSMENT AND PLAN:??????????   ??  1. ??IgG kappa  multiple myeloma / Plasma Cell Leukemia:??Relapsed disease??  - S/p??treatment??Dara/Velcade/Dex??x 8 cycles??(06/22/17 - 10/2017). Followed by??maintenance Dara??Q4 wks/Velcade??Q2ks/Dex??(started 11/08/17)  - Now w/ progressive disease based on myeloma labs (01/03/18) & PET scan (01/10/18), see above??  ??  PLAN:??DCEP for disease and pain control. Hope for allogeneic transplant in CR2, but needs to disease response and improvement in lung function??    - M-spike (01/27/18) - 0.5 g/dL, previously 1.0 g/dL  ??  DCEP Cycle #1, Day + 24  ??  2. ??ID: Afebrile w/ possible bacterial and fungal multifocal PNA  - Fungitell and galactomannan (01/25/18) - Positive   - Cont Valtrex ppx  - Cont Meropenem Day + 15  - Eraxis Day + 12    3. Heme:??Chemotherapy induced pancytopenia   - Transfuse for Hgb < 7 and Platelets <??10K  - S/p Neulasta (01/19/18)  - No transfusion today   ??  4.??Metabolic: Stable renal fxn and e-lytes except for steroid-induced hyperglycemia, hypoNa and hypoPhos  - Cont high regimen Lispro SSI, AC&HS  - S/p Lasix 40 mg IV daily (01/25/18 - 01/30/18)   -  Start KPhos 500 mg bid (started 02/04/18)  - Cont IVF:  NS @ 20 mL/hr  - Keep Mg > 2 and K+ > 4.0 and Phos > 2.0    5. Pulmonary:??Acute respiratory distress with mild hypercapnia and hypoxemia from COPD exacerbation and underlying PNA has resolved.  He also is being treated for bacterial and fungal PNA which was likely contributing to acute respiratory failure   - Pulm Nodule: Stable??on??CT chest from??04/23/17??&??10/18/17 &??11/26/17; cont to monitor.   - CTPA??(11/26/17): No PE;??resolution of??mild upper lobe tree-in-bud opacities; Mild emphysema. Two 3 mm LLL pulmonary nodules, stable  - PFT (12/27/17):????compared to??07/2016:??FVC 3.25 L, 66%, and FEV1  1.42 L, 38%. FEV1/FVC is 44%. This is unchanged from prior study. Air trapping seen on lung volumes. Diffusion capacity 3.91, 79% of predicted, down from 5.21??  - CTPA (01/22/18):  No PE, patchy groundglass opacities and tree-in-bud are seen  throughout the  lungs which are new compared to prior examination.   - CT chest (01/31/18): Stable innumerable pulmonary nodules bilaterally favoring metastatic disease.  - CT chest (01/31/18) - cont to show pulm nodules  - Pulm following, appreciate recs. Cont regular f/u w/ Dr. Dorann Lodge on d/c  - Cont home inhalers    - Cont Xopenox q4hrs  - Cont Steroid taper: Solumedrol 80 mg TID (started 01/22/18), 40 mg TID (01/25/18), 40 mg bid (01/26/18), decrease/change Pred 25 mg bid (02/03/18), pulm tapering   - Bronch / Diatherix (02/03/18) - Pending     ??  6. GI/Nutrition: Appetite and oral intake is fair  - Cont PPI ppx w/ steroids??  - Cont low microbial diet ??  Constipation: ??Improved  - Cont Dulcolax prn & Senokot-s 2 tabs daily    ??  7. 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  - Echo (01/23/18):  LVEF 50-55# w/ normal diastolic fxn & no obvious regional wall motion abnormalities  - Cardiology following, appreciate recs  - Cont Lipitor??  - S/p Dilt gtt (stopped 01/27/18) & Amio gtt (stopped 01/31/18), cont Diltiazem CD 240 mg daily, Amiodarone 200 mg daily & Lopressor 25 mg bid  - Cont steroids for COPD exacerbation, pericarditis resolved   - Cont Celebrex (started 01/26/18)  ??  8. Psych:  Ongoing concerns about disease progression and ability to get transplant   Anxiety/Depression: Ongoing  - Cont Zoloft??50 mg daily??  - Psych to follow   Insomnia:??No complaints??currently??  ??  9. Peripheral Neuropathy:??Ongoing  - Cont Gabapentin 300 mg??TID  ??  10. Bone Health:??No acute fx   -??H/o spinal cord compression & diffuse lytic lesions t/o skeleton  - CT Chest (04/23/17) - multiple lytic lesions throughout the skeleton   - Cont Ca/Vit D &??Zometa monthly (given??01/03/18) - next dose when discharged  ??  11.??Acute on??Chronic left lower back pain d/t Neoplasm:????Improved w/ chemotherapy  - S/p Radiation (09/01/17 - 09/15/17,??Fried)  - Cont Fentanyl patch??50 mcg/hr (increased 01/13/18, Rx 01/18/18)  - Cont Percocet  7.5/325 mg q4hrs prn    12.  Steroid induced myopathy:  Ongoing   - Fall (02/01/18)  - Cont PT/OT  - Cont steroid taper    - DVT Prophylaxis: Platelets <50,000 cells/dL - prophylactic lovenox on hold and mechanical prophylaxis with bilateral SCDs while in bed in place.  Contraindications to pharmacologic prophylaxis: Thrombocytopenia  Contraindications to mechanical prophylaxis: None    - Disposition: Once breathing and chest pain improves       Wayland Salinas, APRN - CNP  Juliann Mule. Derrill Kay, [place]  [place]

## 2018-02-04 NOTE — Progress Notes (Signed)
S: SOB is better. No chest pain, LH/dizziness or palpitations. States bronchoscopy went well.      Tele: Sinus, PAC    O:  Physical Exam:  BP 106/67    Pulse 75    Temp 97.6 ??F (36.4 ??C) (Oral)    Resp 15    Ht 5' 9"  (1.753 m)    Wt 195 lb 15.8 oz (88.9 kg)    SpO2 93%    BMI 28.94 kg/m??    General (appearance):  No acute distress  Eyes: anicteric   Neck: soft, No JVD  Ears/Nose/Mouth/Thorat: No cyanosis  CV: RRR   Respiratory:  + wheeze and rhonchi   GI: soft, non-tender, non-distended  Skin: Warm, dry. No rashes  Neuro/Psych: Alert and oriented x 3. Appropriate behavior  Ext:  No c/c. 1+ BLE edema  Pulses:  2+ radial     I.O's= -2.2 liters    Weight  Admission: Weight: 194 lb 10.7 oz (88.3 kg)   Today: Weight: 195 lb 15.8 oz (88.9 kg)    CBC:   Recent Labs     02/02/18  0345 02/03/18  0400 02/03/18  0725 02/03/18  0940 02/04/18  0344   WBC 3.4* 3.7*  --   --  4.0   HGB 8.5* 8.1*  --   --  8.3*   HCT 25.2* 24.0*  --   --  24.2*   MCV 93.0 92.1  --   --  92.7   PLT 13* 14* 39* 75* 55*     BMP:   Recent Labs     02/02/18  0345 02/03/18  0410 02/04/18  0344   NA 133* 132* 135*   K 4.7 4.5 4.4   CL 98* 96* 97*   CO2 27 27 29    PHOS 2.3* 2.2* 1.3*   BUN 22* 25* 21*   CREATININE 0.5* 0.6* 0.5*     Mag:   Lab Results   Component Value Date    MG 2.00 02/04/2018     LIVER PROFILE:   Recent Labs     02/02/18  0345 02/04/18  0344   AST 17 18   ALT 53* 47*   BILIDIR <0.2 <0.2   BILITOT 0.3 <0.2   ALKPHOS 100 107     PT/INR:   Recent Labs     02/03/18  0400   PROTIME 17.2*   INR 1.51*     APTT:   Recent Labs     02/03/18  0400   APTT 46.7*     Pro-BNP:   Lab Results   Component Value Date    PROBNP 324 01/21/2018       Imaging:    02/01/2018 CT chest:     Stable innumerable pulmonary nodules bilaterally favoring metastatic disease.   ??   Minimal decrease in size of dominant left axillary lymph node Jan 27, 2018   ??   Stable osteolytic lesions of the axial skeleton, likely metastatic.     01/31/2018 ECG: NSR    01/27/2018 CT  chest:  1. Interval development of multiple pulmonary nodules seen scattered throughout both lungs. There is also some nodular areas of patchy consolidation within the left lung and some interstitial infiltrate within the right upper lobe periphery. Findings are   ??nonspecific but favor an inflammatory etiology such as an atypical pneumonia or bronchiolitis. Interval development of pulmonary metastatic disease is considered less likely.   2. Moderate emphysema.   3. Left axillary lymphadenopathy is mildly  decreased in comparison the prior study.   4. Previously seen abnormal paraspinal soft tissue identified along the lower thoracic spine has decreased in size and only some small ill-defined nodular soft tissue remains. Findings are consistent with neoplastic disease which is responding to    therapy.   5. Stable osseous metastatic disease.       01/21/2018 CTA PE     1. ??No pulmonary embolism.   2. ??Patchy groundglass opacities and tree-in-bud are seen throughout the    lungs which are new compared to prior examination. ??This most likely    represents atypical infection/inflammation. ??Clinical correlation is    recommended.   3. ??Paraseptal and centrilobular emphysematous change of the lungs.   4. ??Enlarged left axillary lymph nodes are not significantly changed    compared to PET/CT on 01/10/2018. ??This is worrisome for nodal metastasis.   5. ??Scattered lucent lesions are seen throughout the ribs, spine, and    scapula. ??Findings may represent multiple myeloma or osseous metastasis.       01/21/2018 TTE: EF 50-55%. No def RWMA (inferior and inferoseptum was normal). No sig valve regurgitation or stenosis.  ??  01/22/18 ECGs reviewed. Earlier ecg seemed to demonstrate acute inferior MI but Tns negative and further ecgs showed evolution other ST segments suggesting acute pericarditis.  ??  01/21/2018 CTA Chest: No PE. Tree-in-bud bilateral opacities suggesting atypical infection/inflammation. Enlarged L axillary notes  unchanged. Ribs, spine, and scapula lesions suggesting MM/bony mets. No sig pericardial effusion noted.  ??  01/22/18 CXR: Hyperinflated lungs. No definite acute consolidation    Assessment:    62 y.o. admitted with severe SOB.  -Acute pericarditis  -Multiple myeloma  -COPD  -Neutropenia  -Pneumonia with "tree in bud" appearance of CT chest  -Hyperlipidemia  -parox A fib with RVR      Plan:  -Keep K>4, Mg>2.  -Amio, diltiazem, and metoprolol   -Lipitor

## 2018-02-04 NOTE — Care Coordination-Inpatient (Signed)
Type of Admission  Multiple Myeloma/ Plasma Cell Leukemia  C1 D24 DCEP  Admitted with SOB        Central venous catheter  Right Single Lumen Port ( 01/12/18)        Plan          Update  01/25/18:n  Admitted through the Emergency Room with acute shortness of breath. Now being treated with acute pericarditis.  States he is feeling better.  Out of bed to chair, audible wheezing.  Currently of a Amiofdarone infusion.  01/26/18: Walked to bathroom & became very dyspneic & congested.  Assisted back to bed with the assist of 2 nurses.  Medicated with IV Morphine.  01/31/18:  Positive culture for galactomanan & aspergillus.  Currently on room air.  For  Possible bronchoscopy mid-week.  02/02/18:  Currently on room air for bronchoscopy tomorrow. Continue prednisone taper.  02/04/18:  Status post bronchoscopy form yesterday.  Currently on room air.  Aware that next course of chemotherapy may need to start soon.        Education  02/01/18:  Discussed Cresemba as fungal coverage, assisted Rosemary with for for [facility] Support      Discharge  To be determined        [place]  01/31/18:  Cresemba Form Completed--> Re-sent to [employer] [facility]

## 2018-02-04 NOTE — Progress Notes (Signed)
RESPIRATORY THERAPY ASSESSMENT    Name:  David Watkins  Medical Record Number:  [id number]  Age: 62 y.o.   Gender: male  DOB: 1956-03-04  Today's Date:  02/04/2018  Room:  3526/3526-01    Assessment     Is the patient being admitted for a COPD or Asthma exacerbation?  Yes   (If yes the patient will be seen every 4 hours for the first 24 hours and then reassessed)    Patient Admission Diagnosis      Allergies  Allergies   Allergen Reactions   . Lenalidomide Rash   . Pomalidomide Hives   . Ceclor [Cefaclor] Hives       Minimum Predicted Vital Capacity:     1100          Actual Vital Capacity:      1500              Pulmonary History:Former smoker/COPD  Home Oxygen Therapy:  room air  Home Respiratory Therapy: Albuterol   Current Respiratory Therapy:  Xopenex Q4WA, Pulmicort BID, Performist BID, Spiriva daily, 3%+Albuterol BID, Albuterol PRN  Treatment Type: HHN  Medications: Formoterol Fumarate    Respiratory Severity Index(RSI)   Patients with orders for inhalation medications, oxygen, or any therapeutic treatment modality will be placed on Respiratory Protocol.  They will be assessed with the first treatment and at least every 72 hours thereafter.  The following severity scale will be used to determine frequency of treatment intervention.    Smoking History: Pulmonary Disease or Smoking History, Greater than 15 pack year = 2    Social History  Social History     Tobacco Use   . Smoking status: Former Smoker     Last attempt to quit: 11/25/2012     Years since quitting: 5.1   . Smokeless tobacco: Never Used   Substance Use Topics   . Alcohol use: Yes     Comment: socially   . Drug use: No       Recent Surgical History: None = 0  Past Surgical History  Past Surgical History:   Procedure Laterality Date   . BONE MARROW BIOPSY     . BONE MARROW TRANSPLANT     . BRONCHOSCOPY N/A 02/03/2018    BRONCHOSCOPY WITH BAL, CHOICE ANESTHESIA performed by [name], MD at [facility] ENDOSCOPY   . BRONCHOSCOPY  02/03/2018    BRONCHOSCOPY  THERAPUTIC ASPIRATION SUBSEQUENT performed by [name], MD at [facility] ENDOSCOPY   . OTHER SURGICAL HISTORY Left 08/17/2016    trifusion cath placement   . PRE-MALIGNANT / BENIGN SKIN LESION EXCISION Left 03/29/2017    EXCISE LESION LEFT EXTERNAL EAR WITH FROZEN SECTION, FULL THICKNESS SKIN GRAFT       Level of Consciousness: Alert, Oriented, and Cooperative = 0    Level of Activity: Walking with assistance = 1    Respiratory Pattern: Regular Pattern; RR 8-20 = 0    Breath Sounds: Absent bilaterally and/or with wheezes = 3    Sputum  Sputum Color: Clear, Tenacity: Thick, Sputum How Obtained: Cough on request  Cough: Strong, productive = 1    Vital Signs   BP 89/72   Pulse 75   Temp 97.6 F (36.4 C) (Oral)   Resp 15   Ht 5\' 9"  (1.753 m)   Wt 195 lb 15.8 oz (88.9 kg)   SpO2 95%   BMI 28.94 kg/m   SPO2 (COPD values may differ): Greater than or equal to  92% on room air = 0    Peak Flow (asthma only): not applicable = 0    RSI: 7-8 = BID and Q4HPRN (every four hours as needed) for dyspnea        Plan       Goals: mobilize retained secretions and improve oxygenation    Patient/caregiver was educated on the proper method of use for Respiratory Care Devices:  Yes      Level of patient/caregiver understanding able to:   ? Verbalize understanding   ? Demonstrate understanding       ? Teach back        ? Needs reinforcement       ?  No available caregiver               ?  Other:     Response to education:  Excellent     Is patient being placed on Home Treatment Regimen?  No     Does the patient have everything they need prior to discharge?  NA     Comments: This therapy changed per Dr. Eulah Pont. Vest to be done with treatments.     Plan of Care: Continue chest vest, Xopenex, Albuterol+3% BID, performist BID, pulmicort BID, Spiriva MDI daily    Electronically signed by Dola Argyle, RCP on 02/04/2018 at 12:14 PM    Respiratory Protocol Guidelines     1. Assessment and treatment by Respiratory Therapy will be initiated  for medication and therapeutic interventions upon initiation of aerosolized medication.  2. Physician will be contacted for respiratory rate (RR) greater than 35 breaths per minute. Therapy will be held for heart rate (HR) greater than 140 beats per minute, pending direction from physician.  3. Bronchodilators will be administered via Metered Dose Inhaler (MDI) with spacer when the following criteria are met:  a. Alert and cooperative     b. HR < 140 bpm  c. RR < 30 bpm                d. Can demonstrate a 2-3 second inspiratory hold  4. Bronchodilators will be administered via Hand Held Nebulizer [facility]) to patients when ANY of the following criteria are met  a. Incognizant or uncooperative          b. Patients treated with HHN at Home        c. Unable to demonstrate proper use of MDI with spacer     d. RR > 30 bpm   5. Bronchodilators will be delivered via Metered Dose Inhaler (MDI), HHN, Aerogen to intubated patients on mechanical ventilation.  6. Inhalation medication orders will be delivered and/or substituted as outlined below.    Aerosolized Medications Ordering and Administration Guidelines:    1. All Medications will be ordered by a physician, and their frequency and/or modality will be adjusted as defined by the patients Respiratory Severity Index (RSI) score.  2. If the patient does not have documented COPD, consider discontinuing anticholinergics when RSI is less than 9.  3. If the bronchospasm worsens (increased RSI), then the bronchodilator frequency can be increased to a maximum of every 4 hours.  If greater than every 4 hours is required, the physician will be contacted.  4. If the bronchospasm improves, the frequency of the bronchodilator can be decreased, based on the patient's RSI, but not less than home treatment regimen frequency.  5. Bronchodilator(s) will be discontinued if patient has a RSI less than 9 and has received no scheduled or as  needed treatment for 72  Hrs.    Patients Ordered on a  Mucolytic Agent:    1. Must always be administered with a bronchodilator.    2. Discontinue if patient experiences worsened bronchospasm, or secretions have lessened to the point that the patient is able to clear them with a cough.    Anti-inflammatory and Combination Medications:    1. If the patient lacks prior history of lung disease, is not using inhaled anti-inflammatory medication at home, and lacks wheezing by examination or by history for at least 24 hours, contact physician for possible discontinuation.

## 2018-02-04 NOTE — Progress Notes (Signed)
Pulmonary Followup Note    CC: COPD exacerbation, pneumonia, neutropenic fever and myeloma  Subjective:  Had bronchoscopy yesterday with therapeutic aspiration and lavage.  Says he slept well, his breathing does feel a bit better.    ROS:  Denies headache, nausea or chest pain.    24HR INTAKE/OUTPUT:      Intake/Output Summary (Last 24 hours) at 02/04/2018 1347  Last data filed at 02/04/2018 1226  Gross per 24 hour   Intake 2287 ml   Output 2000 ml   Net 287 ml       ??? sodium chloride (Inhalant)  15 mL Nebulization BID   ??? albuterol  2.5 mg Nebulization BID   ??? phosphorus  500 mg Oral BID   ??? metoprolol tartrate  50 mg Oral BID   ??? predniSONE  25 mg Oral BID   ??? amiodarone  200 mg Oral Daily   ??? guaiFENesin  600 mg Oral BID   ??? diltiazem  240 mg Oral Daily   ??? celecoxib  100 mg Oral BID   ??? insulin lispro  0-18 Units Subcutaneous TID WC   ??? insulin lispro  0-9 Units Subcutaneous Nightly   ??? anidulafungin  100 mg Intravenous Q24H   ??? atorvastatin  10 mg Oral Nightly   ??? calcium elemental  500 mg Oral BID   ??? fentaNYL  1 patch Transdermal Q72H   ??? gabapentin  300 mg Oral TID   ??? pantoprazole  40 mg Oral QAM AC   ??? sennosides-docusate sodium  2 tablet Oral Daily   ??? sertraline  50 mg Oral Daily   ??? valACYclovir  500 mg Oral BID   ??? meropenem  1 g Intravenous Q8H   ??? sodium chloride flush  10 mL Intravenous 2 times per day   ??? Saline Mouthwash  15 mL Swish & Spit 4x Daily AC & HS   ??? tiotropium  18 mcg Inhalation Daily   ??? budesonide  0.25 mg Nebulization BID    And   ??? formoterol  20 mcg Nebulization BID   ??? levalbuterol  1.25 mg Nebulization Q4H WA           PHYSICAL EXAMINATION:  BP 89/72    Pulse 75    Temp 97.6 ??F (36.4 ??C) (Oral)    Resp 15    Ht 5' 9"  (1.753 m)    Wt 195 lb 15.8 oz (88.9 kg)    SpO2 95%    BMI 28.94 kg/m??   CURRENT PULSE OXIMETRY:  SpO2: 95 %  24HR PULSE OXIMETRY RANGE:  SpO2  Avg: 95.4 %  Min: 93 %  Max: 100 % on ra      Gen: mild distress. Speaking in full  sentences with trace accessory muscle use  HEENT: PERRL, EOMI, OP nl  Lung: faint expiratory wheezing throughout  CV: RRR without M/R/R  Abd: +BS, soft, NT/ND  Ext: No edema.    DATA  CBC:   Recent Labs     02/02/18  0345 02/03/18  0400 02/03/18  0725 02/03/18  0940 02/04/18  0344   WBC 3.4* 3.7*  --   --  4.0   HGB 8.5* 8.1*  --   --  8.3*   HCT 25.2* 24.0*  --   --  24.2*   MCV 93.0 92.1  --   --  92.7   PLT 13* 14* 39* 75* 55*     BMP:   Recent Labs  02/02/18  0345 02/03/18  0410 02/04/18  0344   NA 133* 132* 135*   K 4.7 4.5 4.4   CL 98* 96* 97*   CO2 27 27 29    PHOS 2.3* 2.2* 1.3*   BUN 22* 25* 21*   CREATININE 0.5* 0.6* 0.5*     No results for input(s): PHART, PCO2ART, PO2ART in the last 72 hours.  LIVER PROFILE:   Recent Labs     02/02/18  0345 02/04/18  0344   AST 17 18   ALT 53* 47*   BILIDIR <0.2 <0.2   BILITOT 0.3 <0.2   ALKPHOS 100 107       CXR REVIEWED BY ME AND SHOWED:  CT Chest WO Contrast   Final Result      Stable innumerable pulmonary nodules bilaterally favoring metastatic disease.      Minimal decrease in size of dominant left axillary lymph node Jan 27, 2018      Stable osteolytic lesions of the axial skeleton, likely metastatic.            CT CHEST W CONTRAST   Final Result   1. Interval development of multiple pulmonary nodules seen scattered throughout both lungs. There is also some nodular areas of patchy consolidation within the left lung and some interstitial infiltrate within the right upper lobe periphery. Findings are    nonspecific but favor an inflammatory etiology such as an atypical pneumonia or bronchiolitis. Interval development of pulmonary metastatic disease is considered less likely.   2. Moderate emphysema.   3. Left axillary lymphadenopathy is mildly decreased in comparison the prior study.   4. Previously seen abnormal paraspinal soft tissue identified along the lower thoracic spine has decreased in size and only some small ill-defined nodular soft tissue remains.  Findings are consistent with neoplastic disease which is responding to    therapy.   5. Stable osseous metastatic disease.      XR CHEST PORTABLE   Final Result      No acute pulmonary pathology or change from the prior study                  CTA PULMONARY W CONTRAST   Final Result   1.  No pulmonary embolism.   2.  Patchy groundglass opacities and tree-in-bud are seen throughout the    lungs which are new compared to prior examination.  This most likely    represents atypical infection/inflammation.  Clinical correlation is    recommended.   3.  Paraseptal and centrilobular emphysematous change of the lungs.   4.  Enlarged left axillary lymph nodes are not significantly changed    compared to PET/CT on 01/10/2018.  This is worrisome for nodal metastasis.   5.  Scattered lucent lesions are seen throughout the ribs, spine, and    scapula.  Findings may represent multiple myeloma or osseous metastasis.          XR CHEST STANDARD (2 VW)   Final Result      No acute cardiopulmonary findings.           ASSESSMENT/PLAN:  This is a 62 y.o. male with Fever, pulmonary nodules and multiple myeloma    Thick secretions on bronch.  Started hypertonic saline nebs with albuterol BID  Cytology, bacterial and fungal cultures and diatherix pending  Continue slow prednisone wean  Continue empiric antibiotics and treatments for aspergillus pending results of bronchoscopy    Neville Route, MD

## 2018-02-04 NOTE — Care Coordination-Inpatient (Signed)
PT talked with SW about patient being a good candidate for ARU.  NP completed a consult and SW called this in.  PT had talked with patient about rehab.    Freddi Starr, MSW, [place]

## 2018-02-04 NOTE — Behavioral Health Treatment Team (Signed)
Psychology    Pt very relieved at result of bronch as he is breathing much better.  He's feeling hopeful that he can get chemo and still go to transplant if his disease gets under control.  Provided emotional support and will continue to see pt through hospital stay.   Noralee Dutko, Psy.D., ABPP

## 2018-02-04 NOTE — Progress Notes (Signed)
Physical Therapy  Facility/Department: [facility]T [facility]  Daily Treatment Note  NAME: David Watkins  DOB: [birth year]  MRN: [id number]    Date of Service: 02/04/2018    Discharge Recommendations:David Watkins scored a 17/24 on the AM-PAC short mobility form. Current research shows that an AM-PAC score of 17 or less is typically not associated with a discharge to the patient's home setting. Based on the patient???s AM-PAC score and their current functional mobility deficits, it is recommended that the patient have 5-7 sessions per week of Physical Therapy at d/c to increase the patient???s independence.          PT Equipment Recommendations  Equipment Needed: Yes  Walker: Rolling    Assessment   Body structures, Functions, Activity limitations: Decreased functional mobility ;Decreased endurance;Decreased strength;Decreased balance;Increased Pain  Assessment: Pt demonstrate slightly decreased activity tolerance and increased anxiety at times with mobility constantly seeking afffirmation on how he is doing with his mobility. Pt verbalizing fears of falling again 2/2 multiple episodes of knee buckling throughout this week. Discussed at length with pt the possibilities for further rehab upon d/c. Pt states that he would like to think about this and make a decision when his d/c gets closer. Cont with PT POC.   Treatment Diagnosis: decreased independence with functional mobility  Prognosis: Watkins  Patient Education: Educated on HEP to increase LE strength.  REQUIRES PT FOLLOW UP: Yes  Activity Tolerance  Activity Tolerance: Patient limited by fatigue;Patient limited by endurance;Patient limited by pain  Activity Tolerance: lengthy discussion about pt's current functional mobility and how he may benefit from going to a rehab facility for more therapy prior to d/c home. Pt verbalized understanding and states that he would like to see how he is doing as it gets closer to his d/c.      Patient Diagnosis(es): The primary  encounter diagnosis was Atypical pneumonia. Diagnoses of Neutropenia associated with infection (HCC) and Sepsis due to pneumonia [facility]) were also pertinent to this visit.     has a past medical history of Bone pain, Hyperlipidemia, Leukemia, plasma cell, in relapse (HCC), Low back pain, Multiple myeloma (HCC), and Neuropathy.   has a past surgical history that includes bone marrow biopsy; other surgical history (Left, 08/17/2016); bone marrow transplant; pre-malignant / benign skin lesion excision (Left, 03/29/2017); bronchoscopy (N/A, 02/03/2018); and bronchoscopy (02/03/2018).    Restrictions  Position Activity Restriction  Other position/activity restrictions: Up as tolerated  Subjective   General  Chart Reviewed: Yes  Additional Pertinent Hx: Pt is a 62 yo male who presented to the ER on with complaint of chest pain and shortness of breath. 6/6, Had a bronchoscopy. PMH: bone pain, HLD, leukemia, low back pain, multiple myeloma, neuropathy.   Family / Caregiver Present: No  Referring Practitioner: Dr. Loyal Watkins  Subjective  Subjective: Pt states that he has had multiple episodes of knee buckling recently and is nervous about the ability of his legs to support him.  General Comment  Comments: Pt was supine in bed upon arrival. Pt agreeable to PT intervention.  Pain Screening  Patient Currently in Pain: Yes(6/10, chronic back pain.)  Vital Signs  Patient Currently in Pain: Yes(6/10, chronic back pain.)       Orientation     Cognition      Objective   Bed mobility  Supine to Sit: Stand by assistance  Transfers  Sit to Stand: Minimal Assistance(from chair, bed, and bench in hallway; VC for hand placement and ant  wt shift. Pt needing CGA from bed, but needing MIN A from bench; Pt initially anxious requesting increased assistance 2/2 previous fall and difficulty.)  Stand to sit: Contact guard assistance(VC for hand placement and eccentric control)  Ambulation  Ambulation?: Yes  Ambulation 2  Surface - 2: level tile  Device 2:  Rolling Walker  Assistance 2: Contact guard assistance  Quality of Gait 2: reciprocal pattern with decreased B step length and foot clearance. Fwd flexed posture. Poor walker positioning. No LOB noted  Distance: 2 x 120', 15'  Comments: VC for upright posture with fwd gaze, positioning in RW, increased B heel strike and step length, and pursed lip breathing. Pt requiring prolonged seated rest breaks following longer bouts of ambulation 2/2 fatigue and SOB  Stairs/Curb  Stairs?: No        Exercises  Straight Leg Raise: 2 x 10 reps B  Hip Flexion: 2 x 10 reps B  Knee Long Arc Quad: 2 x 10 reps B with a 5 sec hold in ext                        G-Code     OutComes Score                                                     AM-PAC Score  AM-PAC Inpatient Mobility Raw Score : 18 (01/31/18 1621)  AM-PAC Inpatient T-Scale Score : 43.63 (01/31/18 1621)  Mobility Inpatient CMS 0-100% Score: 46.58 (01/31/18 1621)  Mobility Inpatient CMS G-Code Modifier : CK (01/31/18 1621)          Goals  Short term goals  Time Frame for Short term goals: by d/c  Short term goal 1: Pt will transfer supine <> sit independently ongoing  Short term goal 2: Pt will transfer sit <> stand MOD I ongoing  Short term goal 3: Pt will ambulate 200' with RW and MOD I ongoing  Patient Goals   Patient goals : Be able to independently get up and walk around without feeling SOB.    Plan    Plan  Times per week: 2-5  Current Treatment Recommendations: Strengthening, Stair training, Investment banker, operational, Location manager, Teaching laboratory technician, Building services engineer, Teacher, early years/pre, Tour manager Devices  Type of devices: Left in chair, Nurse notified, Call light within reach, Chair alarm in place, Gait belt     Therapy Time   Individual Concurrent Group Co-treatment   Time In 1055         Time Out 1148         Minutes 53                 David Watkins, PT    If pt d/c'd prior to next treatment, this note serves as a discharge  note.

## 2018-02-04 NOTE — Plan of Care (Signed)
Problem: Falls - Risk of:  Goal: Will remain free from falls  Description  Will remain free from falls  Outcome: Ongoing  Note:   Pt is a High fall risk. See Leamon Arnt Fall Score and ABCDS Injury Risk assessments.   Explained fall risk precautions to pt and family and rationale behind their use to keep the patient safe. Pt bed is in low position, side rails up, call light and belongings are in reach. Fall wristband applied and present on pts wrist.  Bed alarm on.  Pt encouraged to call for assistance. Will continue with hourly rounds for PO intake, pain needs, toileting and repositioning as needed.          Problem: Pain:  Goal: Pain level will decrease  Description  Pain level will decrease  Outcome: Ongoing  Note:   Zen continues to c/o chronic lower back pain- pain medication given with good result- see eMAR.      Problem: Bleeding:  Goal: Will show no signs and symptoms of excessive bleeding  Description  Will show no signs and symptoms of excessive bleeding  Outcome: Ongoing  Note:   Patient's hemoglobin this AM:   Recent Labs     02/04/18  0344   HGB 8.3*     Patient's platelet count this AM:   Recent Labs     02/04/18  0344   PLT 55*    Thrombocytopenia Precautions in place.  Patient showing no signs or symptoms of active bleeding.  Transfusion not indicated at this time.  Patient verbalizes understanding of all instructions. Will continue to assess and implement POC. Call light within reach and hourly rounding in place.         Problem: PROTECTIVE PRECAUTIONS  Goal: Patient will remain free of nosocomial Infections  Outcome: Ongoing  Note:   Pt remains in protective precautions.  Pt educated on wearing mask when in hallways. Pt, staff, and visitors adhering to handwashing guidelines. Pt educated to shower or bathe daily with chlorhexidine and linens changed daily per protocol. Pt verbalizes understanding of low microbial diet. Will continue to monitor.

## 2018-02-04 NOTE — Plan of Care (Signed)
Problem: Falls - Risk of:  Goal: Will remain free from falls  Description  Will remain free from falls  Outcome: Ongoing  High Fall Risk per MORSE/ABCDS: Explained fall risk precautions to pt and family and rationale behind their use to keep the patient safe. Pt bed is in low position, side rails up, call light and belongings are in reach. Fall wristband applied and present on pts wrist.  Bed alarm on.  Pt encouraged to call for assistance. Will continue with hourly rounds for PO intake, pain needs, toileting and repositioning as needed.       Problem: Pain:  Goal: Pain level will decrease  Description  Pain level will decrease  Outcome: Ongoing  Patient continues with chronic lower back pain. PRN pain medication given and scheduled fentanyl patch replaced. Pt resting upon reassessment. Will continue to monitor.       Problem: Cardiac:  Goal: Ability to maintain vital signs within normal range will improve  Description  Ability to maintain vital signs within normal range will improve  Outcome: Ongoing  Pt's HR stable this shift. Pt did go into Afib this shift with rate in 90s but returned to NSR at this time. Rate stable throughout shift. Will continue to monitor.      Problem: Bleeding:  Goal: Will show no signs and symptoms of excessive bleeding  Description  Will show no signs and symptoms of excessive bleeding  Outcome: Ongoing  Patient's hemoglobin this AM:   Recent Labs     02/04/18  0344   HGB 8.3*     Patient's platelet count this AM:   Recent Labs     02/04/18  0344   PLT 55*    Thrombocytopenia Precautions in place.  Patient showing no signs or symptoms of active bleeding.  Transfusion not indicated at this time.  Patient verbalizes understanding of all instructions. Will continue to assess and implement POC. Call light within reach and hourly rounding in place.       Problem: PROTECTIVE PRECAUTIONS  Goal: Patient will remain free of nosocomial Infections  Outcome: Ongoing  Pt remains in neutropenic  precautions per floor policy. Pt, visitors, and staff noted to be following precautions appropriately. Handwashing in place; pt wearing mask in hallway per protocol. Pt in private room. Low microbial diet in place. Will continue to monitor.

## 2018-02-05 LAB — BASIC METABOLIC PANEL
Anion Gap: 9 (ref 3–16)
BUN: 21 mg/dL — ABNORMAL HIGH (ref 7–20)
CO2: 28 mmol/L (ref 21–32)
Calcium: 8.5 mg/dL (ref 8.3–10.6)
Chloride: 98 mmol/L — ABNORMAL LOW (ref 99–110)
Creatinine: 0.5 mg/dL — ABNORMAL LOW (ref 0.9–1.3)
GFR African American: >60 (ref >60)
GFR Non-African American: >60 (ref >60)
Glucose: 225 mg/dL — ABNORMAL HIGH (ref 70–99)
Potassium: 5.1 mmol/L (ref 3.5–5.1)
Sodium: 135 mmol/L — ABNORMAL LOW (ref 136–145)

## 2018-02-05 LAB — POCT GLUCOSE
POC Glucose: 249 mg/dl — ABNORMAL HIGH (ref 70–99)
POC Glucose: 261 mg/dl — ABNORMAL HIGH (ref 70–99)
POC Glucose: 239 mg/dl — ABNORMAL HIGH (ref 70–99)

## 2018-02-05 LAB — CBC WITH AUTO DIFFERENTIAL
Atypical Lymphocytes Relative: 1 % (ref 0–6)
Bands Relative: 2 % (ref 0–7)
Basophils %: 0 %
Basophils Absolute: 0 10*3/uL (ref 0.0–0.2)
Eosinophils %: 0 %
Eosinophils Absolute: 0 10*3/uL (ref 0.0–0.6)
Hematocrit: 24 % — ABNORMAL LOW (ref 40.5–52.5)
Hemoglobin: 8.1 g/dL — ABNORMAL LOW (ref 13.5–17.5)
Lymphocytes %: 2 %
Lymphocytes Absolute: 0.2 10*3/uL — ABNORMAL LOW (ref 1.0–5.1)
MCH: 31.4 pg (ref 26.0–34.0)
MCHC: 33.7 g/dL (ref 31.0–36.0)
MCV: 93.1 fL (ref 80.0–100.0)
MPV: 7.9 fL (ref 5.0–10.5)
Monocytes %: 4 %
Monocytes Absolute: 0.2 10*3/uL (ref 0.0–1.3)
Myelocyte Percent: 1 % — AB
Neutrophils %: 90 %
Neutrophils Absolute: 4.7 10*3/uL (ref 1.7–7.7)
Platelets: 50 10*3/uL — ABNORMAL LOW (ref 135–450)
RBC: 2.57 M/uL — ABNORMAL LOW (ref 4.20–5.90)
RDW: 18 % — ABNORMAL HIGH (ref 12.4–15.4)
WBC: 5.1 10*3/uL (ref 4.0–11.0)

## 2018-02-05 LAB — MAGNESIUM: Magnesium: 1.8 mg/dL (ref 1.80–2.40)

## 2018-02-05 LAB — CULTURE, RESPIRATORY
CULTURE, RESPIRATORY: NO GROWTH
Gram Stain Result: NONE SEEN

## 2018-02-05 LAB — PHOSPHORUS: Phosphorus: 2.4 mg/dL — ABNORMAL LOW (ref 2.5–4.9)

## 2018-02-05 MED FILL — BUDESONIDE 0.25 MG/2ML IN SUSP: 0.25 MG/2ML | RESPIRATORY_TRACT | Qty: 2

## 2018-02-05 MED FILL — LEVALBUTEROL HCL 1.25 MG/0.5ML IN NEBU: 1.25 MG/0.5ML | RESPIRATORY_TRACT | Qty: 1

## 2018-02-05 MED FILL — OXYCODONE-ACETAMINOPHEN 7.5-325 MG PO TABS: 7.5-325 mg | ORAL | Qty: 1

## 2018-02-05 MED FILL — OYSTER SHELL CALCIUM 500 MG PO TABS: 500 mg | ORAL | Qty: 1

## 2018-02-05 MED FILL — PREDNISONE 5 MG PO TABS: 5 mg | ORAL | Qty: 1

## 2018-02-05 MED FILL — METOPROLOL TARTRATE 50 MG PO TABS: 50 mg | ORAL | Qty: 1

## 2018-02-05 MED FILL — CELEBREX 100 MG PO CAPS: 100 mg | ORAL | Qty: 1

## 2018-02-05 MED FILL — PERFOROMIST 20 MCG/2ML IN NEBU: 20 MCG/2ML | RESPIRATORY_TRACT | Qty: 2

## 2018-02-05 MED FILL — MUCINEX 600 MG PO TB12: 600 mg | ORAL | Qty: 1

## 2018-02-05 MED FILL — PANTOPRAZOLE SODIUM 40 MG PO TBEC: 40 mg | ORAL | Qty: 1

## 2018-02-05 MED FILL — GABAPENTIN 300 MG PO CAPS: 300 mg | ORAL | Qty: 1

## 2018-02-05 MED FILL — DILTIAZEM HCL ER COATED BEADS 240 MG PO CP24: 240 mg | ORAL | Qty: 1

## 2018-02-05 MED FILL — PHOSPHA 250 NEUTRAL 155-852-130 MG PO TABS: 155-852-130 mg | ORAL | Qty: 2

## 2018-02-05 MED FILL — VALACYCLOVIR HCL 500 MG PO TABS: 500 mg | ORAL | Qty: 1

## 2018-02-05 MED FILL — MEROPENEM 1 G IV SOLR: 1 g | INTRAVENOUS | Qty: 1

## 2018-02-05 MED FILL — HUMALOG KWIKPEN 100 UNIT/ML SC SOPN: 100 [IU]/mL | SUBCUTANEOUS | Qty: 3

## 2018-02-05 MED FILL — SENNOSIDES-DOCUSATE SODIUM 8.6-50 MG PO TABS: 8.6-50 mg | ORAL | Qty: 2

## 2018-02-05 MED FILL — LIPITOR 20 MG PO TABS: 20 mg | ORAL | Qty: 1

## 2018-02-05 MED FILL — AMIODARONE HCL 200 MG PO TABS: 200 mg | ORAL | Qty: 1

## 2018-02-05 MED FILL — ERAXIS 100 MG IV SOLR: 100 mg | INTRAVENOUS | Qty: 30

## 2018-02-05 MED FILL — SERTRALINE HCL 50 MG PO TABS: 50 mg | ORAL | Qty: 1

## 2018-02-05 MED FILL — LORAZEPAM 1 MG PO TABS: 1 mg | ORAL | Qty: 1

## 2018-02-05 NOTE — Plan of Care (Addendum)
Problem: Falls - Risk of:  Goal: Will remain free from falls  Outcome: Ongoing  Note:   Pt at risk for falls.  Fall risk protocol in place. Bed in low position. bed brakes in place. Fall risk sign posted . Patient calls out appropriately. Patient in bed , side rails up x 2 and call light in reach. Will continue to monitor safety during hourly rounding.      Problem: Pain:  Description  Pain management should include both nonpharmacologic and pharmacologic interventions.  Goal: Pain level will decrease  Outcome: Ongoing  Note:   Patient c/o back pain . Percocet po 1 tab given. VSS. Patient in bed with no other complaints at this time .Will continue to montior.     Problem: Breathing Pattern - Ineffective:  Goal: Ability to achieve and maintain a regular respiratory rate will improve  Outcome: Ongoing  Note:   Expiratory wheezing noted. Productive cough. Patient on room air. O2 sat' 92-94%/. Patient denies SOB at this time. Will continue to monitor.

## 2018-02-05 NOTE — Progress Notes (Signed)
[facility]   Cardiology Progress Note     Admit Date: 01/21/2018     Reason for follow up: Pericarditis , Afib with RVR    HPI and Interval History:   Patient seen and examined. Clinical notes reviewed.   Telemetry reviewed. No new complaint today.   No major events overnight.   Denies having chest pain, shortness of breath, dyspnea on exertion, Orthopnea, PND at the time of this visit.    Review of System:  All other systems reviewed except for that noted above. Pertinent negatives and positives are:     ?? General: negative for fever, chills   ?? Ophthalmic ROS: negative for - eye pain or loss of vision  ?? ENT ROS: negative for - headaches, sore throat   ?? Respiratory: negative for - cough, sputum  ?? Cardiovascular: Reviewed in HPI  ?? Gastrointestinal: negative for - abdominal pain, diarrhea, N/V  ?? Hematology: negative for - bleeding, blood clots, bruising or jaundice  ?? Genito-Urinary:  negative for - Dysuria or incontinence  ?? Musculoskeletal: negative for - Joint swelling, muscle pain  ?? Neurological: negative for - confusion, dizziness, headaches   ?? Psychiatric: No anxiety, no depression.  ?? Dermatological: negative for - rash      Physical Examination:  Vitals:    02/05/18 0945   BP: 110/71   Pulse: 84   Resp: 16   Temp: 98.2 ??F (36.8 ??C)   SpO2: 95%        Intake/Output Summary (Last 24 hours) at 02/05/2018 1223  Last data filed at 02/05/2018 0530  Gross per 24 hour   Intake 1044.67 ml   Output --   Net 1044.67 ml     In: 804.7 [P.O.:240; I.V.:464.7]  Out: -    Wt Readings from Last 3 Encounters:   02/04/18 195 lb 15.8 oz (88.9 kg)   01/18/18 194 lb 9.6 oz (88.3 kg)   01/10/18 194 lb 12.8 oz (88.4 kg)     Temp  Avg: 98 ??F (36.7 ??C)  Min: 97.5 ??F (36.4 ??C)  Max: 98.4 ??F (36.9 ??C)  Pulse  Avg: 71  Min: 61  Max: 84  BP  Min: 99/65  Max: 131/77  SpO2  Avg: 95.1 %  Min: 93 %  Max: 96 %    ?? Telemetry: Sinus rhythm   ?? Constitutional: Alert. Oriented to person, place, and time. No distress.   ?? Head:  Normocephalic and atraumatic.   ?? Mouth/Throat: Lips appear moist. Oropharynx is clear and moist.  ?? Eyes: Conjunctivae normal. EOM are normal.   ?? Neck: Neck supple. No lymphadenopathy. No rigidity. No JVD present.   ?? Cardiovascular: Normal rate, regular rhythm. Normal S1&S2. Carotid pulse 2+ bilaterally.    ?? Pulmonary/Chest: Bilateral respiratory sounds present. No respiratory accessory muscle use.  No wheezes, No rhonchi.    ?? Abdominal: Soft. Normal bowel sounds present. No distension, No tenderness. No splenomegaly. No hernia.  ?? Musculoskeletal: No tenderness. No edema    ?? Lymphadenopathy: Has no cervical adenopathy.   ?? Neurological: Alert and oriented. Cranial nerve II-XII grossly intact, No gross deficit to touch.  ?? Skin: Skin is warm and dry. No rash, lesions, ulcerations noted.  ?? Psychiatric: No anxiety nor agitation.    Labs, diagnostic and imaging results reviewed.   Reviewed.   Recent Labs     02/03/18  0410 02/04/18  0344 02/05/18  0550 02/05/18  0710   NA 132* 135* 135*  --  K 4.5 4.4 5.1  --    CL 96* 97* 98*  --    CO2 27 29 28   --    PHOS 2.2* 1.3*  --  2.4*   BUN 25* 21* 21*  --    CREATININE 0.6* 0.5* 0.5*  --      Recent Labs     02/03/18  0400  02/03/18  0940 02/04/18  0344 02/05/18  0550   WBC 3.7*  --   --  4.0 5.1   HGB 8.1*  --   --  8.3* 8.1*   HCT 24.0*  --   --  24.2* 24.0*   MCV 92.1  --   --  92.7 93.1   PLT 14*   < > 75* 55* 50*    < > = values in this interval not displayed.     Lab Results   Component Value Date    TROPONINI <0.01 01/22/2018     Estimated Creatinine Clearance: 173 mL/min (A) (based on SCr of 0.5 mg/dL (L)).   No results found for: BNP  Lab Results   Component Value Date    PROTIME 17.2 02/03/2018    PROTIME 12.8 01/31/2018    PROTIME 12.7 01/27/2018    INR 1.51 02/03/2018    INR 1.12 01/31/2018    INR 1.11 01/27/2018     No results found for: CHOL, HDL, TRIG    Scheduled Meds:  ??? sodium chloride (Inhalant)  15 mL Nebulization BID   ??? phosphorus  500 mg Oral  BID   ??? metoprolol tartrate  50 mg Oral BID   ??? predniSONE  25 mg Oral BID   ??? amiodarone  200 mg Oral Daily   ??? guaiFENesin  600 mg Oral BID   ??? diltiazem  240 mg Oral Daily   ??? celecoxib  100 mg Oral BID   ??? insulin lispro  0-18 Units Subcutaneous TID WC   ??? insulin lispro  0-9 Units Subcutaneous Nightly   ??? anidulafungin  100 mg Intravenous Q24H   ??? atorvastatin  10 mg Oral Nightly   ??? calcium elemental  500 mg Oral BID   ??? fentaNYL  1 patch Transdermal Q72H   ??? gabapentin  300 mg Oral TID   ??? pantoprazole  40 mg Oral QAM AC   ??? sennosides-docusate sodium  2 tablet Oral Daily   ??? sertraline  50 mg Oral Daily   ??? valACYclovir  500 mg Oral BID   ??? meropenem  1 g Intravenous Q8H   ??? sodium chloride flush  10 mL Intravenous 2 times per day   ??? Saline Mouthwash  15 mL Swish & Spit 4x Daily AC & HS   ??? tiotropium  18 mcg Inhalation Daily   ??? budesonide  0.25 mg Nebulization BID    And   ??? formoterol  20 mcg Nebulization BID   ??? levalbuterol  1.25 mg Nebulization Q4H WA     Continuous Infusions:  ??? dextrose     ??? sodium chloride 20 mL/hr at 02/05/18 1001     PRN Meds:acetaminophen, diphenhydrAMINE, potassium phosphate IVPB, glucose, dextrose, glucagon (rDNA), dextrose, potassium chloride, magnesium sulfate, LORazepam, albuterol, bisacodyl, ondansetron, oxyCODONE-acetaminophen, prochlorperazine, sodium chloride flush, potassium chloride, magnesium sulfate, magnesium hydroxide, Saline Mouthwash, alteplase, LORazepam     Patient Active Problem List    Diagnosis Date Noted   ??? Open wound of left ear 03/04/2017     Priority: High   ??? Ear lesion  Priority: High   ??? Atypical pneumonia    ??? Mixed hyperlipidemia    ??? Acute pericarditis    ??? Paroxysmal atrial fibrillation ([place]) 01/24/2018   ??? Pneumonia 01/22/2018   ??? Acute respiratory failure with hypoxia and hypercapnia ([place]) 01/22/2018   ??? Neutropenia associated with infection ([place])    ??? Pain 01/12/2018   ??? Pulmonary nodule    ??? COPD exacerbation (HCC) 10/22/2017   ???  Multiple myeloma and immunoproliferative neoplasms ([place]) 05/25/2017   ??? 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 Skin lesion 03/15/2017   ??? Moderate malnutrition (HCC) 09/03/2016   ??? Multiple myeloma in remission ([place]) 08/26/2016   ??? 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 Multiple myeloma not having achieved remission ([place]) 03/16/2016      Active Hospital Problems    Diagnosis Date Noted   ??? Atypical pneumonia [J18.9]    ??? Mixed hyperlipidemia [E78.2]    ??? Acute pericarditis [I30.9]    ??? Paroxysmal atrial fibrillation ([place]) [I48.0] 01/24/2018   ??? Acute respiratory failure with hypoxia and hypercapnia (HCC) [J96.01, J96.02] 01/22/2018   ??? Neutropenia associated with infection ([place]) [D70.3]    ??? COPD exacerbation ([place]) [J44.1] 10/22/2017   ??? Multiple myeloma and immunoproliferative neoplasms ([place]) [C90.00, C88.9] 05/25/2017   ??? COPD, severe ([place]) [J44.9] 08/06/2016       Assessment and Plan:     1. Pericarditis  2.  Paroxysmal atrial fibrillation, currently in normal sinus rhythm    Continue amiodarone 200 mg p.o. daily for a period of one month and then could stop.  Continue Cardizem 240 mg daily  Patient does not have any previous history of paroxysmal atrial fibrillation.  Since this could be secondary to acute pericarditis.  Hence, we are not initiating him on [facility] as of now.    Thank you for allowing me to participate in the care of this patient. If you have any questions, please do not hesitate to contact me.    Ludwig Lean MD   Cardiac Electrophysiology  [place]  Office [phone]  [employer]

## 2018-02-05 NOTE — Progress Notes (Signed)
[place] Progress Note    02/05/2018     PONCE SKILLMAN    MRN: [id number]    DOB: Apr 16, 1956    SUBJECTIVE: Doing much better.  Cough improved.     ECOG PS:  (2) Ambulatory and capable of self care, unable to carry out work activity, up and about > 50% or waking hours    Isolation: None    Medications    Scheduled Meds:  ??? sodium chloride (Inhalant)  15 mL Nebulization BID   ??? phosphorus  500 mg Oral BID   ??? metoprolol tartrate  50 mg Oral BID   ??? predniSONE  25 mg Oral BID   ??? amiodarone  200 mg Oral Daily   ??? guaiFENesin  600 mg Oral BID   ??? diltiazem  240 mg Oral Daily   ??? celecoxib  100 mg Oral BID   ??? insulin lispro  0-18 Units Subcutaneous TID WC   ??? insulin lispro  0-9 Units Subcutaneous Nightly   ??? anidulafungin  100 mg Intravenous Q24H   ??? atorvastatin  10 mg Oral Nightly   ??? calcium elemental  500 mg Oral BID   ??? fentaNYL  1 patch Transdermal Q72H   ??? gabapentin  300 mg Oral TID   ??? pantoprazole  40 mg Oral QAM AC   ??? sennosides-docusate sodium  2 tablet Oral Daily   ??? sertraline  50 mg Oral Daily   ??? valACYclovir  500 mg Oral BID   ??? meropenem  1 g Intravenous Q8H   ??? sodium chloride flush  10 mL Intravenous 2 times per day   ??? Saline Mouthwash  15 mL Swish & Spit 4x Daily AC & HS   ??? tiotropium  18 mcg Inhalation Daily   ??? budesonide  0.25 mg Nebulization BID    And   ??? formoterol  20 mcg Nebulization BID   ??? levalbuterol  1.25 mg Nebulization Q4H WA     Continuous Infusions:  ??? dextrose     ??? sodium chloride 20 mL/hr at 02/04/18 0620     PRN Meds:.acetaminophen, diphenhydrAMINE, potassium phosphate IVPB, glucose, dextrose, glucagon (rDNA), dextrose, potassium chloride, magnesium sulfate, LORazepam, albuterol, bisacodyl, ondansetron, oxyCODONE-acetaminophen, prochlorperazine, sodium chloride flush, potassium chloride, magnesium sulfate, magnesium hydroxide, Saline Mouthwash, alteplase, LORazepam    ROS:  As noted above, otherwise remainder of 10-point ROS negative    Physical Exam:     I&O:       Intake/Output Summary (Last 24 hours) at 02/05/2018 0916  Last data filed at 02/05/2018 0530  Gross per 24 hour   Intake 1044.67 ml   Output --   Net 1044.67 ml       Vital Signs:  BP 119/64    Pulse 67    Temp 97.7 ??F (36.5 ??C) (Oral)    Resp 16    Ht 5' 9"  (1.753 m)    Wt 195 lb 15.8 oz (88.9 kg)    SpO2 96%    BMI 28.94 kg/m??     Weight:    Wt Readings from Last 3 Encounters:   02/04/18 195 lb 15.8 oz (88.9 kg)   01/18/18 194 lb 9.6 oz (88.3 kg)   01/10/18 194 lb 12.8 oz (88.4 kg)         ??  General: Awake, alert and oriented.  HEENT:??normocephalic, alopecia,??PERRL, no scleral erythema or icterus, Oral mucosa moist and intact  LYMPH: ??Left axillary lymphadenopathy??  NECK: supple without palpable adenopathy  BACK: Straight  negative CVAT  SKIN:??warm dry and intact without lesions rashes or masses  CHEST:??rhonchi w/ insp/exp wheeze,??without use of accessory muscles  CV: Normal S1 S2, irregular, no MRG  ABD:??NT ND normoactive BS, no palpable masses or hepatosplenomegaly  EXTREMITIES:??without edema, denies calf tenderness  NEURO: CN II - XII grossly intact  CATHETER:??Right IJ PAC (06/21/17, [place])??- CDI        Data    CBC:   Recent Labs     02/03/18  0400  02/03/18  0940 02/04/18  0344 02/05/18  0550   WBC 3.7*  --   --  4.0 5.1   HGB 8.1*  --   --  8.3* 8.1*   HCT 24.0*  --   --  24.2* 24.0*   MCV 92.1  --   --  92.7 93.1   PLT 14*   < > 75* 55* 50*    < > = values in this interval not displayed.     BMP/Mag:  Recent Labs     02/03/18  0410 02/04/18  0344 02/05/18  0550 02/05/18  0710   NA 132* 135* 135*  --    K 4.5 4.4 5.1  --    CL 96* 97* 98*  --    CO2 27 29 28   --    PHOS 2.2* 1.3*  --  2.4*   BUN 25* 21* 21*  --    CREATININE 0.6* 0.5* 0.5*  --    MG 1.80 2.00  --  1.80     LIVP:   Recent Labs     02/04/18  0344   AST 18   ALT 47*   BILIDIR <0.2   BILITOT <0.2   ALKPHOS 107     Coags:   Recent Labs     02/03/18  0400   PROTIME 17.2*   INR 1.51*   APTT 46.7*     Uric Acid   Recent Labs     02/04/18  0344    LABURIC 1.9*       Diagnostics:  1. ??PET scan (01/10/18):    ??    2.  CTA Chest (525/19):  1. ??No pulmonary embolism.  2. ??Patchy groundglass opacities and tree-in-bud are seen throughout the   lungs which are new compared to prior examination. ??This most likely   represents atypical infection/inflammation. ??Clinical correlation is   recommended.  3. ??Paraseptal and centrilobular emphysematous change of the lungs.  4. ??Enlarged left axillary lymph nodes are not significantly changed   compared to PET/CT on 01/10/2018. ??This is worrisome for nodal metastasis.  5. ??Scattered lucent lesions are seen throughout the ribs, spine, and   scapula. ??Findings may represent multiple myeloma or osseous metastasis.    3.  Echocardiogram (01/23/18):  ??Technically difficult examination.  ??Normal left ventricle size, wall thickness, and systolic function with an  ??estimated ejection fraction of 50-55%. No obvious regional wall motion  ??abnormalities are seen (inferior and inferoseptal walls do appear normal, as  ??clinically questioned). Diastolic filling parameters suggests normal  ??diastolic function.  ??Aortic valve appears sclerotic but opens adequately.  ??Possible small/organized pericardial effusion though fat pad cannot be ruled  ??out.    4.  CT Chest (01/31/18):  Stable innumerable pulmonary nodules bilaterally favoring metastatic disease.    Minimal decrease in size of dominant left axillary lymph node Jan 27, 2018    Stable osteolytic lesions of the axial skeleton, likely metastatic.    Myeloma Labs (01/03/18):  SPEP:????reveals that M2,  previously characterized as monoclonal IgG kappa in  mid-to-slow gamma is 1.0 gm/dL, greater than the 0.2 gm/dL observed on 08 Nov 2017.??  SIFE:????Serum immunofixation electrophoresis reveals persistent M2 in mid-to-slow gamma, a monoclonal IgG kappa. ??A recent M-spike, M1, was minor monoclonal IgG lambda in mid-gamma; M1 continues to be absent.  SFLC:  Kappa:  30.80, previously (11/08/17)  -??10.40  Lambda:  1.46, previously (11/08/17) -??5.97  Free Kappa Lambda Ratio:  21.10, previously (11/08/17) -??1.74  Quantitative Immunoglobulins:  IgG:??  1480, previously (11/08/17) - 552  IgA:??  <26, previously (11/08/17) -??30  IgM:??  <20    Myeloma Labs (01/27/18):  Serum Light Chains: Pending    SPE/IFE:??M-protein persists in the gamma region on serum protein electrophoresis. The amount of this protein is 0.5 gm/dL     Quantitative Immunoglobulins:  IgG:??  791  IgA:??  17  IgM:??  11  ??  PROBLEM LIST: ??????????   ????  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 ??COPD   9.????Influenza A??(10/12/17)  10.  COPD Exacerbation (12/2017)  11.  Pericarditis (12/2017)  12.  A. Fib w/ RVR (12/2017)  ????  TREATMENT: ??????????   ??  1. Rad Tx to T5-7, T10-L3, Right Scapula - 3000 cGy - Dr. Jamas Lav 03/20/16-04/01/16  2. RVD x1 03/20/16 - discontinued d/t rash   3. Velcade/Pomalyst/Dex x3 cycles 04/23/16-06/17/16 - discontinued d/t reaction to pomalyst  4. Velcade/Dex x 1 cycle 06/26/16 (last dose of dexamethasone 07/22/16  5. High-dose melphalan followed by administration of PBSCs 2.36 x10^6 cd34cells/kg on 08/28/16  6. Maintenance Revlimid 55m daily (12/2016-04/23/17)  7. Dexamethasone 43mdaily (04/24/17)  8. DCEP   Cycle #1 - 04/26/17  Cycle #2 - 05/25/17 - excellent response  9.??Dara/Velcade/Dex (C1D1 - 06/22/17) - BMBx ??10/18/17 - CR  Cycles 7-8 (21 day cycle)  - Dex 20 mg po D4,5.8,9,11 and 12  - Dara 16 mg/Kg D1 only with Dex 20 mg IV  - Velcade 1.3 mg/M2 D1,4,8 and 11  Cycle 9 and beyond (maintenance, 11/08/17??- 01/03/18)  Dara 16 mg/Kg Q 28 days  Dex 12 mg IV D1  Velcade Q 2 weeks??  10. DCEP   Cycle #1 - 01/13/18  ??  ASSESSMENT AND PLAN:??????????   ??  1. ??IgG kappa multiple myeloma / Plasma Cell Leukemia:??Relapsed disease??  -  S/p??treatment??Dara/Velcade/Dex??x 8 cycles??(06/22/17 - 10/2017). Followed by??maintenance Dara??Q4 wks/Velcade??Q2ks/Dex??(started 11/08/17)  - Now w/ progressive disease based on myeloma labs (01/03/18) & PET scan (01/10/18), see above??  ??  PLAN:??DCEP for disease and pain control. Hope for allogeneic transplant in CR2, but needs to disease response and improvement in lung function??    - M-spike (01/27/18) - 0.5 g/dL, previously 1.0 g/dL  ??  DCEP Cycle #1, Day + 25    Change to outpatient therapy when he gets stronger. Probably later next week.  ??  2. ??ID: Afebrile w/ possible bacterial and fungal multifocal PNA  - Fungitell and galactomannan (01/25/18) - Positive   - Cont Valtrex ppx  - Cont Meropenem Day + 16  - Eraxis Day + 13    3. Heme:??Chemotherapy induced pancytopenia   - Transfuse for Hgb < 7 and Platelets <??10K  - S/p Neulasta (01/19/18)  - No transfusion today   ??  4.??Metabolic: Stable renal fxn and e-lytes except for steroid-induced hyperglycemia, hypoNa and hypoPhos  - Cont  high regimen Lispro SSI, AC&HS  - S/p Lasix 40 mg IV daily (01/25/18 - 01/30/18)   - Start KPhos 500 mg bid (started 02/04/18)  - Cont IVF:  NS @ 20 mL/hr  - Keep Mg > 2 and K+ > 4.0 and Phos > 2.0    5. Pulmonary:??Acute respiratory distress with mild hypercapnia and hypoxemia from COPD exacerbation and underlying PNA has resolved.  He also is being treated for bacterial and fungal PNA which was likely contributing to acute respiratory failure   - Pulm Nodule: Stable??on??CT chest from??04/23/17??&??10/18/17 &??11/26/17; cont to monitor.   - CTPA??(11/26/17): No PE;??resolution of??mild upper lobe tree-in-bud opacities; Mild emphysema. Two 3 mm LLL pulmonary nodules, stable  - PFT (12/27/17):????compared to??07/2016:??FVC 3.25 L, 66%, and FEV1  1.42 L, 38%. FEV1/FVC is 44%. This is unchanged from prior study. Air trapping seen on lung volumes. Diffusion capacity 3.91, 79% of predicted, down from 5.21??  - CTPA (01/22/18):  No PE, patchy groundglass opacities and  tree-in-bud are seen throughout the  lungs which are new compared to prior examination.   - CT chest (01/31/18): Stable innumerable pulmonary nodules bilaterally favoring metastatic disease.  - CT chest (01/31/18) - cont to show pulm nodules  - Pulm following, appreciate recs. Cont regular f/u w/ Dr. Dorann Lodge on d/c  - Cont home inhalers    - Cont Xopenox q4hrs  - Cont Steroid taper: Solumedrol 80 mg TID (started 01/22/18), 40 mg TID (01/25/18), 40 mg bid (01/26/18), decrease/change Pred 25 mg bid (02/03/18), pulm tapering   - Bronch / Diatherix (02/03/18) - parainfluenza virus, Acinetobacter baumanni, Adenovirus    ??  6. GI/Nutrition: Appetite and oral intake is fair  - Cont PPI ppx w/ steroids??  - Cont low microbial diet ??  Constipation: ??Improved  - Cont Dulcolax prn & Senokot-s 2 tabs daily    ??  7. 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  - Echo (01/23/18):  LVEF 50-55# w/ normal diastolic fxn & no obvious regional wall motion abnormalities  - Cardiology following, appreciate recs  - Cont Lipitor??  - S/p Dilt gtt (stopped 01/27/18) & Amio gtt (stopped 01/31/18), cont Diltiazem CD 240 mg daily, Amiodarone 200 mg daily & Lopressor 25 mg bid  - Cont steroids for COPD exacerbation, pericarditis resolved   - Cont Celebrex (started 01/26/18)  ??  8. Psych:  Ongoing concerns about disease progression and ability to get transplant   Anxiety/Depression: Ongoing  - Cont Zoloft??50 mg daily??  - Psych to follow   Insomnia:??No complaints??currently??  ??  9. Peripheral Neuropathy:??Ongoing  - Cont Gabapentin 300 mg??TID  ??  10. Bone Health:??No acute fx   -??H/o spinal cord compression & diffuse lytic lesions t/o skeleton  - CT Chest (04/23/17) - multiple lytic lesions throughout the skeleton   - Cont Ca/Vit D &??Zometa monthly (given??01/03/18) - next dose when discharged  ??  11.??Acute on??Chronic left lower back pain d/t Neoplasm:????Improved w/ chemotherapy  - S/p Radiation (09/01/17 - 09/15/17,??Fried)  - Cont Fentanyl  patch??50 mcg/hr (increased 01/13/18, Rx 01/18/18)  - Cont Percocet 7.5/325 mg q4hrs prn    12.  Steroid induced myopathy:  Ongoing   - Fall (02/01/18)  - Cont PT/OT  - Cont steroid taper    - DVT Prophylaxis: Platelets <50,000 cells/dL - prophylactic lovenox on hold and mechanical prophylaxis with bilateral SCDs while in bed in place.  Contraindications to pharmacologic prophylaxis: Thrombocytopenia  Contraindications to mechanical prophylaxis: None    -  Disposition: Once breathing and chest pain improves       Harlene Salts, MD       [phone]

## 2018-02-05 NOTE — Plan of Care (Signed)
WIFE REMAINS AT BEDSIDE. ALL SAFETY AND PHYSICAL NEEDS ARE MET AT THIS TIME. I WILL CONTINUE TO MONITOR PATIENT THROUGHOUT SHIFT

## 2018-02-06 LAB — CBC WITH AUTO DIFFERENTIAL
Basophils %: 0.4 %
Basophils Absolute: 0 10*3/uL (ref 0.0–0.2)
Eosinophils %: 0 %
Eosinophils Absolute: 0 10*3/uL (ref 0.0–0.6)
Hematocrit: 23.9 % — ABNORMAL LOW (ref 40.5–52.5)
Hemoglobin: 7.9 g/dL — ABNORMAL LOW (ref 13.5–17.5)
Lymphocytes %: 3.4 %
Lymphocytes Absolute: 0.1 10*3/uL — ABNORMAL LOW (ref 1.0–5.1)
MCH: 31.7 pg (ref 26.0–34.0)
MCHC: 33.2 g/dL (ref 31.0–36.0)
MCV: 95.3 fL (ref 80.0–100.0)
MPV: 8.3 fL (ref 5.0–10.5)
Monocytes %: 5.1 %
Monocytes Absolute: 0.2 10*3/uL (ref 0.0–1.3)
Neutrophils %: 91.1 %
Neutrophils Absolute: 4 10*3/uL (ref 1.7–7.7)
Platelets: 41 10*3/uL — CL (ref 135–450)
RBC: 2.5 M/uL — ABNORMAL LOW (ref 4.20–5.90)
RDW: 18.3 % — ABNORMAL HIGH (ref 12.4–15.4)
WBC: 4.4 10*3/uL (ref 4.0–11.0)

## 2018-02-06 LAB — URINALYSIS
Bilirubin Urine: NEGATIVE
Glucose, Ur: 500 mg/dL — AB
Ketones, Urine: NEGATIVE mg/dL
Leukocyte Esterase, Urine: NEGATIVE
Nitrite, Urine: NEGATIVE
Protein, UA: 30 mg/dL — AB
Specific Gravity, UA: 1.02 (ref 1.005–1.030)
Urobilinogen, Urine: 0.2 E.U./dL (ref <2.0)
pH, UA: 6.5 (ref 5.0–8.0)

## 2018-02-06 LAB — BASIC METABOLIC PANEL
Anion Gap: 10 (ref 3–16)
BUN: 18 mg/dL (ref 7–20)
CO2: 29 mmol/L (ref 21–32)
Calcium: 8.7 mg/dL (ref 8.3–10.6)
Chloride: 99 mmol/L (ref 99–110)
Creatinine: 0.5 mg/dL — ABNORMAL LOW (ref 0.8–1.3)
GFR African American: >60 (ref >60)
GFR Non-African American: >60 (ref >60)
Glucose: 183 mg/dL — ABNORMAL HIGH (ref 70–99)
Potassium: 5 mmol/L (ref 3.5–5.1)
Sodium: 138 mmol/L (ref 136–145)

## 2018-02-06 LAB — ASPERGILLUS GALACT AG BY EIA-A
Aspergillus Galacto AG: NEGATIVE
Aspergillus Galacto Index: 0.49

## 2018-02-06 LAB — POCT GLUCOSE
POC Glucose: 199 mg/dl — ABNORMAL HIGH (ref 70–99)
POC Glucose: 218 mg/dl — ABNORMAL HIGH (ref 70–99)
POC Glucose: 256 mg/dl — ABNORMAL HIGH (ref 70–99)
POC Glucose: 268 mg/dl — ABNORMAL HIGH (ref 70–99)

## 2018-02-06 LAB — MICROSCOPIC URINALYSIS

## 2018-02-06 LAB — MAGNESIUM: Magnesium: 1.9 mg/dL (ref 1.80–2.40)

## 2018-02-06 LAB — PHOSPHORUS: Phosphorus: 2.7 mg/dL (ref 2.5–4.9)

## 2018-02-06 MED FILL — PHOSPHA 250 NEUTRAL 155-852-130 MG PO TABS: 155-852-130 mg | ORAL | Qty: 2

## 2018-02-06 MED FILL — VALACYCLOVIR HCL 500 MG PO TABS: 500 mg | ORAL | Qty: 1

## 2018-02-06 MED FILL — BUDESONIDE 0.25 MG/2ML IN SUSP: 0.25 MG/2ML | RESPIRATORY_TRACT | Qty: 2

## 2018-02-06 MED FILL — SODIUM CHLORIDE 3 % IN NEBU: 3 % | RESPIRATORY_TRACT | Qty: 15

## 2018-02-06 MED FILL — SERTRALINE HCL 50 MG PO TABS: 50 mg | ORAL | Qty: 1

## 2018-02-06 MED FILL — LEVALBUTEROL HCL 1.25 MG/0.5ML IN NEBU: 1.25 MG/0.5ML | RESPIRATORY_TRACT | Qty: 1

## 2018-02-06 MED FILL — MEROPENEM 1 G IV SOLR: 1 g | INTRAVENOUS | Qty: 1

## 2018-02-06 MED FILL — MUCINEX 600 MG PO TB12: 600 mg | ORAL | Qty: 1

## 2018-02-06 MED FILL — OXYCODONE-ACETAMINOPHEN 7.5-325 MG PO TABS: 7.5-325 mg | ORAL | Qty: 1

## 2018-02-06 MED FILL — GABAPENTIN 300 MG PO CAPS: 300 mg | ORAL | Qty: 1

## 2018-02-06 MED FILL — CELEBREX 100 MG PO CAPS: 100 mg | ORAL | Qty: 1

## 2018-02-06 MED FILL — PERFOROMIST 20 MCG/2ML IN NEBU: 20 MCG/2ML | RESPIRATORY_TRACT | Qty: 2

## 2018-02-06 MED FILL — PREDNISONE 5 MG PO TABS: 5 mg | ORAL | Qty: 1

## 2018-02-06 MED FILL — OYSTER SHELL CALCIUM 500 MG PO TABS: 500 mg | ORAL | Qty: 1

## 2018-02-06 MED FILL — PANTOPRAZOLE SODIUM 40 MG PO TBEC: 40 mg | ORAL | Qty: 1

## 2018-02-06 MED FILL — ERAXIS 100 MG IV SOLR: 100 mg | INTRAVENOUS | Qty: 30

## 2018-02-06 MED FILL — LIPITOR 20 MG PO TABS: 20 mg | ORAL | Qty: 1

## 2018-02-06 MED FILL — LORAZEPAM 1 MG PO TABS: 1 mg | ORAL | Qty: 1

## 2018-02-06 MED FILL — SENNOSIDES-DOCUSATE SODIUM 8.6-50 MG PO TABS: 8.6-50 mg | ORAL | Qty: 2

## 2018-02-06 MED FILL — DILTIAZEM HCL ER COATED BEADS 240 MG PO CP24: 240 mg | ORAL | Qty: 1

## 2018-02-06 MED FILL — AMIODARONE HCL 200 MG PO TABS: 200 mg | ORAL | Qty: 1

## 2018-02-06 MED FILL — METOPROLOL TARTRATE 50 MG PO TABS: 50 mg | ORAL | Qty: 1

## 2018-02-06 NOTE — Progress Notes (Signed)
[facility] Progress Note    02/06/2018     GAD EMGE    MRN: [id number]    DOB: 1955/10/30    SUBJECTIVE: Feeling better.  Still coughing, but was up and walking.  Leg strength improving.    ECOG PS:  (2) Ambulatory and capable of self care, unable to carry out work activity, up and about > 50% or waking hours    Isolation: None    Medications    Scheduled Meds:  . sodium chloride (Inhalant)  15 mL Nebulization BID   . phosphorus  500 mg Oral BID   . metoprolol tartrate  50 mg Oral BID   . predniSONE  25 mg Oral BID   . amiodarone  200 mg Oral Daily   . guaiFENesin  600 mg Oral BID   . diltiazem  240 mg Oral Daily   . celecoxib  100 mg Oral BID   . insulin lispro  0-18 Units Subcutaneous TID WC   . insulin lispro  0-9 Units Subcutaneous Nightly   . anidulafungin  100 mg Intravenous Q24H   . atorvastatin  10 mg Oral Nightly   . calcium elemental  500 mg Oral BID   . fentaNYL  1 patch Transdermal Q72H   . gabapentin  300 mg Oral TID   . pantoprazole  40 mg Oral QAM AC   . sennosides-docusate sodium  2 tablet Oral Daily   . sertraline  50 mg Oral Daily   . valACYclovir  500 mg Oral BID   . meropenem  1 g Intravenous Q8H   . sodium chloride flush  10 mL Intravenous 2 times per day   . Saline Mouthwash  15 mL Swish & Spit 4x Daily AC & HS   . tiotropium  18 mcg Inhalation Daily   . budesonide  0.25 mg Nebulization BID    And   . formoterol  20 mcg Nebulization BID   . levalbuterol  1.25 mg Nebulization Q4H WA     Continuous Infusions:  . dextrose     . sodium chloride 20 mL/hr at 02/05/18 1001     PRN Meds:.acetaminophen, diphenhydrAMINE, potassium phosphate IVPB, glucose, dextrose, glucagon (rDNA), dextrose, potassium chloride, magnesium sulfate, LORazepam, albuterol, bisacodyl, ondansetron, oxyCODONE-acetaminophen, prochlorperazine, sodium chloride flush, potassium chloride, magnesium sulfate, magnesium hydroxide, Saline Mouthwash, alteplase, LORazepam    ROS:  As noted above, otherwise remainder of 10-point ROS  negative    Physical Exam:     I&O:      Intake/Output Summary (Last 24 hours) at 02/06/2018 0804  Last data filed at 02/05/2018 2200  Gross per 24 hour   Intake 0 ml   Output --   Net 0 ml       Vital Signs:  BP (!) 141/88   Pulse 76   Temp 97.5 F (36.4 C) (Oral)   Resp 16   Ht 5\' 9"  (1.753 m)   Wt 193 lb 14.4 oz (88 kg)   SpO2 96%   BMI 28.63 kg/m     Weight:    Wt Readings from Last 3 Encounters:   02/06/18 193 lb 14.4 oz (88 kg)   01/18/18 194 lb 9.6 oz (88.3 kg)   01/10/18 194 lb 12.8 oz (88.4 kg)           General: Awake, alert and oriented.  HEENT:normocephalic, alopecia,PERRL, no scleral erythema or icterus, Oral mucosa moist and intact  LYMPH: Left axillary lymphadenopathy  NECK: supple without palpable adenopathy  BACK:  Straight negative CVAT  SKIN:warm dry and intact without lesions rashes or masses  CHEST:rhonchi w/ insp/exp wheeze,without use of accessory muscles  CV: Normal S1 S2, irregular, no MRG  ABD:NT ND normoactive BS, no palpable masses or hepatosplenomegaly  EXTREMITIES:without edema, denies calf tenderness  NEURO: CN II - XII grossly intact  CATHETER:Right IJ PAC (06/21/17, [facility])- CDI        Data    CBC:   Recent Labs     02/03/18  0940 02/04/18  0344 02/05/18  0550   WBC  --  4.0 5.1   HGB  --  8.3* 8.1*   HCT  --  24.2* 24.0*   MCV  --  92.7 93.1   PLT 75* 55* 50*     BMP/Mag:  Recent Labs     02/04/18  0344 02/05/18  0550 02/05/18  0710   NA 135* 135*  --    K 4.4 5.1  --    CL 97* 98*  --    CO2 29 28  --    PHOS 1.3*  --  2.4*   BUN 21* 21*  --    CREATININE 0.5* 0.5*  --    MG 2.00  --  1.80     LIVP:   Recent Labs     02/04/18  0344   AST 18   ALT 47*   BILIDIR <0.2   BILITOT <0.2   ALKPHOS 107     Coags:   No results for input(s): PROTIME, INR, APTT in the last 72 hours.  Uric Acid   Recent Labs     02/04/18  0344   LABURIC 1.9*       Diagnostics:  1. PET scan (01/10/18):        2.  CTA Chest (525/19):  1. No pulmonary embolism.  2. Patchy groundglass  opacities and tree-in-bud are seen throughout the   lungs which are new compared to prior examination. This most likely   represents atypical infection/inflammation. Clinical correlation is   recommended.  3. Paraseptal and centrilobular emphysematous change of the lungs.  4. Enlarged left axillary lymph nodes are not significantly changed   compared to PET/CT on 01/10/2018. This is worrisome for nodal metastasis.  5. Scattered lucent lesions are seen throughout the ribs, spine, and   scapula. Findings may represent multiple myeloma or osseous metastasis.    3.  Echocardiogram (01/23/18):  Technically difficult examination.  Normal left ventricle size, wall thickness, and systolic function with an  estimated ejection fraction of 50-55%. No obvious regional wall motion  abnormalities are seen (inferior and inferoseptal walls do appear normal, as  clinically questioned). Diastolic filling parameters suggests normal  diastolic function.  Aortic valve appears sclerotic but opens adequately.  Possible small/organized pericardial effusion though fat pad cannot be ruled  out.    4.  CT Chest (01/31/18):  Stable innumerable pulmonary nodules bilaterally favoring metastatic disease.    Minimal decrease in size of dominant left axillary lymph node Jan 27, 2018    Stable osteolytic lesions of the axial skeleton, likely metastatic.    Myeloma Labs (01/03/18):  SPEP:reveals that M2, previously characterized as monoclonal IgG kappa in  mid-to-slow gamma is 1.0 gm/dL, greater than the 0.2 gm/dL observed on 09 Nov 4399.  SIFE:Serum immunofixation electrophoresis reveals persistent M2 in mid-to-slow gamma, a monoclonal IgG kappa. A recent M-spike, M1, was minor monoclonal IgG lambda in mid-gamma; M1 continues to be absent.  SFLC:  Kappa:  30.80, previously (11/08/17) -10.40  Lambda:  1.46, previously (11/08/17) -5.97  Free Kappa Lambda Ratio:  21.10, previously (11/08/17) -1.74  Quantitative  Immunoglobulins:  IgG:  1480, previously (11/08/17) - 552  IgA:  <26, previously (11/08/17) -30  IgM:  <20    Myeloma Labs (01/27/18):  Serum Light Chains: Pending    SPE/IFE:M-protein persists in the gamma region on serum protein electrophoresis. The amount of this protein is 0.5 gm/dL     Quantitative Immunoglobulins:  IgG:  791  IgA:  17  IgM:  11    PROBLEM LIST:      1.IgG Kappa MultipleMyeloma/Plasma Cell Leukemia (Dx8/2018)  2.Peripheral neuropathy  3. Anxiety/Depression  4.Hyperlipidemia  5.Hypertension  6.Insomnia  7.Chronic lowback paind/t neoplasm (h/o lytic lesions&cord compression @T6  & T11)  8. COPD   9.Influenza A(10/12/17)  10.  COPD Exacerbation (12/2017)  11.  Pericarditis (12/2017)  12.  A. Fib w/ RVR (12/2017)    TREATMENT:      1. Rad Tx to T5-7, T10-L3, Right Scapula - 3000 cGy - Dr. Bonita Quin 03/20/16-04/01/16  2. RVD x1 03/20/16 - discontinued d/t rash   3. Velcade/Pomalyst/Dex x3 cycles 04/23/16-06/17/16 - discontinued d/t reaction to pomalyst  4. Velcade/Dex x 1 cycle 06/26/16 (last dose of dexamethasone 07/22/16  5. High-dose melphalan followed by administration of PBSCs 2.36 x10^6 cd34cells/kg on 08/28/16  6. Maintenance Revlimid 10mg  daily (12/2016-04/23/17)  7. Dexamethasone 40mg  daily (04/24/17)  8. DCEP   Cycle #1 - 04/26/17  Cycle #2 - 05/25/17 - excellent response  9.Dara/Velcade/Dex (C1D1 - 06/22/17) - BMBx 10/18/17 - CR  Cycles 7-8 (21 day cycle)  - Dex 20 mg po D4,5.8,9,11 and 12  - Dara 16 mg/Kg D1 only with Dex 20 mg IV  - Velcade 1.3 mg/M2 D1,4,8 and 11  Cycle 9 and beyond (maintenance, 11/08/17- 01/03/18)  Dara 16 mg/Kg Q 28 days  Dex 12 mg IV D1  Velcade Q 2 weeks  10. DCEP   Cycle #1 - 01/13/18    ASSESSMENT AND PLAN:     1. IgG kappa multiple myeloma / Plasma Cell Leukemia:Relapsed disease  - S/ptreatmentDara/Velcade/Dexx 8 cycles(06/22/17 - 10/2017). Followed bymaintenance DaraQ4 wks/VelcadeQ2ks/Dex(started 11/08/17)  - Now w/  progressive disease based on myeloma labs (01/03/18) & PET scan (01/10/18), see above    PLAN:DCEP for disease and pain control. Hope for allogeneic transplant in CR2, but needs to disease response and improvement in lung function    - M-spike (01/27/18) - 0.5 g/dL, previously 1.0 g/dL    DCEP Cycle #1, Day + 26    Change to outpatient therapy when he gets stronger. Probably later this week.    2. ID: Afebrile w/ possible bacterial and fungal multifocal PNA  - Fungitell and galactomannan (01/25/18) - Positive   - Cont Valtrex ppx  - Cont Meropenem Day + 17  - Eraxis Day + 14    3. Heme:Chemotherapy induced pancytopenia   - Transfuse for Hgb < 7 and Platelets <10K  - S/p Neulasta (01/19/18)  - No transfusion today     4.Metabolic: Stable renal fxn and e-lytes except for steroid-induced hyperglycemia, hypoNa and hypoPhos  - Cont high regimen Lispro SSI, AC&HS  - S/p Lasix 40 mg IV daily (01/25/18 - 01/30/18)   - Start KPhos 500 mg bid (started 02/04/18)  - Cont IVF:  NS @ 20 mL/hr  - Keep Mg > 2 and K+ > 4.0 and Phos > 2.0    5. Pulmonary:Acute respiratory distress with mild hypercapnia  and hypoxemia from COPD exacerbation and underlying PNA has resolved.  He also is being treated for bacterial and fungal PNA which was likely contributing to acute respiratory failure   - Pulm Nodule: StableonCT chest from8/24/18&10/18/17 &11/26/17; cont to monitor.   - CTPA(11/26/17): No PE;resolution ofmild upper lobe tree-in-bud opacities; Mild emphysema. Two 3 mm LLL pulmonary nodules, stable  - PFT (12/27/17):compared to11/2017:FVC 3.25 L, 66%, and FEV1  1.42 L, 38%. FEV1/FVC is 44%. This is unchanged from prior study. Air trapping seen on lung volumes. Diffusion capacity 3.91, 79% of predicted, down from 5.21  - CTPA (01/22/18):  No PE, patchy groundglass opacities and tree-in-bud are seen throughout the  lungs which are new compared to prior examination.   - CT chest (01/31/18): Stable innumerable pulmonary nodules  bilaterally favoring metastatic disease.  - CT chest (01/31/18) - cont to show pulm nodules  - Pulm following, appreciate recs. Cont regular f/u w/ Dr. Karie Kirks on d/c  - Cont home inhalers    - Cont Xopenox q4hrs  - Cont Steroid taper: Solumedrol 80 mg TID (started 01/22/18), 40 mg TID (01/25/18), 40 mg bid (01/26/18), decrease/change Pred 25 mg bid (02/03/18), pulm tapering   - Bronch / Diatherix (02/03/18) - parainfluenza virus, Acinetobacter baumanni, Adenovirus      6. GI/Nutrition: Appetite and oral intake is fair  - Cont PPI ppx w/ steroids  - Cont low microbial diet   Constipation: Improved  - Cont Dulcolax prn & Senokot-s 2 tabs daily      7. 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  - Echo (01/23/18):  LVEF 50-55# w/ normal diastolic fxn & no obvious regional wall motion abnormalities  - Cardiology following, appreciate recs  - Cont Lipitor  - S/p Dilt gtt (stopped 01/27/18) & Amio gtt (stopped 01/31/18), cont Diltiazem CD 240 mg daily, Amiodarone 200 mg daily & Lopressor 25 mg bid  - Cont steroids for COPD exacerbation, pericarditis resolved   - Cont Celebrex (started 01/26/18)    8. Psych:  Ongoing concerns about disease progression and ability to get transplant   Anxiety/Depression: Ongoing  - Cont Zoloft50 mg daily  - Psych to follow   Insomnia:No complaintscurrently    9. Peripheral Neuropathy:Ongoing  - Cont Gabapentin 300 mgTID    10. 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  Contraindications to pharmacologic prophylaxis: Thrombocytopenia  Contraindications to mechanical prophylaxis: None    - Disposition: Once breathing and chest pain improves       Emilee Hero, MD       [phone]

## 2018-02-06 NOTE — Progress Notes (Signed)
lab called to advise of critical platelet level 41. Dr Hunt Oris notified via perfect serve.

## 2018-02-07 LAB — BASIC METABOLIC PANEL
Anion Gap: 8 (ref 3–16)
BUN: 23 mg/dL — ABNORMAL HIGH (ref 7–20)
CO2: 30 mmol/L (ref 21–32)
Calcium: 9 mg/dL (ref 8.3–10.6)
Chloride: 97 mmol/L — ABNORMAL LOW (ref 99–110)
Creatinine: <0.5 mg/dL — ABNORMAL LOW (ref 0.8–1.3)
GFR African American: >60 (ref >60)
GFR Non-African American: >60 (ref >60)
Glucose: 184 mg/dL — ABNORMAL HIGH (ref 70–99)
Potassium: 4.7 mmol/L (ref 3.5–5.1)
Sodium: 135 mmol/L — ABNORMAL LOW (ref 136–145)

## 2018-02-07 LAB — PREPARE RBC (CROSSMATCH)

## 2018-02-07 LAB — CBC WITH AUTO DIFFERENTIAL
Bands Relative: 3 % (ref 0–7)
Basophils %: 0 %
Basophils Absolute: 0 10*3/uL (ref 0.0–0.2)
Eosinophils %: 0 %
Eosinophils Absolute: 0 10*3/uL (ref 0.0–0.6)
Hematocrit: 22.7 % — ABNORMAL LOW (ref 40.5–52.5)
Hemoglobin: 7.5 g/dL — ABNORMAL LOW (ref 13.5–17.5)
Lymphocytes %: 5 %
Lymphocytes Absolute: 0.3 10*3/uL — ABNORMAL LOW (ref 1.0–5.1)
MCH: 31.1 pg (ref 26.0–34.0)
MCHC: 33.1 g/dL (ref 31.0–36.0)
MCV: 93.8 fL (ref 80.0–100.0)
MPV: 8.7 fL (ref 5.0–10.5)
Metamyelocytes Relative: 1 % — AB
Monocytes %: 5 %
Monocytes Absolute: 0.3 10*3/uL (ref 0.0–1.3)
Neutrophils %: 86 %
Neutrophils Absolute: 5 10*3/uL (ref 1.7–7.7)
Platelets: 36 10*3/uL — CL (ref 135–450)
RBC: 2.42 M/uL — ABNORMAL LOW (ref 4.20–5.90)
RDW: 18.4 % — ABNORMAL HIGH (ref 12.4–15.4)
WBC: 5.5 10*3/uL (ref 4.0–11.0)

## 2018-02-07 LAB — POCT GLUCOSE
POC Glucose: 151 mg/dl — ABNORMAL HIGH (ref 70–99)
POC Glucose: 155 mg/dl — ABNORMAL HIGH (ref 70–99)
POC Glucose: 200 mg/dl — ABNORMAL HIGH (ref 70–99)
POC Glucose: 206 mg/dl — ABNORMAL HIGH (ref 70–99)

## 2018-02-07 LAB — HEPATIC FUNCTION PANEL
ALT: 46 U/L — ABNORMAL HIGH (ref 10–40)
AST: 21 U/L (ref 15–37)
Albumin: 2.9 g/dL — ABNORMAL LOW (ref 3.4–5.0)
Alkaline Phosphatase: 102 U/L (ref 40–129)
Bilirubin, Direct: <0.2 mg/dL (ref 0.0–0.3)
Total Bilirubin: 0.3 mg/dL (ref 0.0–1.0)
Total Protein: 5.3 g/dL — ABNORMAL LOW (ref 6.4–8.2)

## 2018-02-07 LAB — PROTIME-INR
INR: 1.02 (ref 0.86–1.14)
Protime: 11.6 s (ref 9.8–13.0)

## 2018-02-07 LAB — RESPIRATORY INFECTION PANEL - DIATHERIX

## 2018-02-07 LAB — URIC ACID: Uric Acid, Serum: 1.8 mg/dL — ABNORMAL LOW (ref 3.5–7.2)

## 2018-02-07 LAB — MAGNESIUM: Magnesium: 1.8 mg/dL (ref 1.80–2.40)

## 2018-02-07 LAB — PHOSPHORUS: Phosphorus: 3.4 mg/dL (ref 2.5–4.9)

## 2018-02-07 LAB — APTT: aPTT: 24.3 s — ABNORMAL LOW (ref 26.0–36.0)

## 2018-02-07 LAB — LACTATE DEHYDROGENASE: LD: 227 U/L — ABNORMAL HIGH (ref 100–190)

## 2018-02-07 MED ORDER — LEVALBUTEROL HCL 1.25 MG/0.5ML IN NEBU
1.25 MG/0.5ML | Freq: Three times a day (TID) | RESPIRATORY_TRACT | Status: DC
Start: 2018-02-07 — End: 2018-02-08
  Administered 2018-02-07 – 2018-02-08 (×3): 1.25 mg via RESPIRATORY_TRACT

## 2018-02-07 MED ORDER — PREDNISONE 20 MG PO TABS
20 MG | Freq: Every day | ORAL | Status: DC
Start: 2018-02-07 — End: 2018-02-08
  Administered 2018-02-08: 13:00:00 40 mg via ORAL

## 2018-02-07 MED FILL — MEROPENEM 1 G IV SOLR: 1 g | INTRAVENOUS | Qty: 2

## 2018-02-07 MED FILL — PREDNISONE 5 MG PO TABS: 5 mg | ORAL | Qty: 1

## 2018-02-07 MED FILL — MEROPENEM 1 G IV SOLR: 1 g | INTRAVENOUS | Qty: 1

## 2018-02-07 MED FILL — BUDESONIDE 0.25 MG/2ML IN SUSP: 0.25 MG/2ML | RESPIRATORY_TRACT | Qty: 2

## 2018-02-07 MED FILL — LEVALBUTEROL HCL 1.25 MG/0.5ML IN NEBU: 1.25 MG/0.5ML | RESPIRATORY_TRACT | Qty: 1

## 2018-02-07 MED FILL — ERAXIS 100 MG IV SOLR: 100 mg | INTRAVENOUS | Qty: 30

## 2018-02-07 MED FILL — PERFOROMIST 20 MCG/2ML IN NEBU: 20 MCG/2ML | RESPIRATORY_TRACT | Qty: 2

## 2018-02-07 MED FILL — OXYCODONE-ACETAMINOPHEN 7.5-325 MG PO TABS: 7.5-325 mg | ORAL | Qty: 1

## 2018-02-07 MED FILL — CELEBREX 100 MG PO CAPS: 100 mg | ORAL | Qty: 1

## 2018-02-07 MED FILL — OYSTER SHELL CALCIUM 500 MG PO TABS: 500 mg | ORAL | Qty: 1

## 2018-02-07 MED FILL — MUCINEX 600 MG PO TB12: 600 mg | ORAL | Qty: 1

## 2018-02-07 MED FILL — AMIODARONE HCL 200 MG PO TABS: 200 mg | ORAL | Qty: 1

## 2018-02-07 MED FILL — METOPROLOL TARTRATE 50 MG PO TABS: 50 mg | ORAL | Qty: 1

## 2018-02-07 MED FILL — DILTIAZEM HCL ER COATED BEADS 240 MG PO CP24: 240 mg | ORAL | Qty: 1

## 2018-02-07 MED FILL — PHOSPHA 250 NEUTRAL 155-852-130 MG PO TABS: 155-852-130 mg | ORAL | Qty: 2

## 2018-02-07 MED FILL — VALACYCLOVIR HCL 500 MG PO TABS: 500 mg | ORAL | Qty: 1

## 2018-02-07 MED FILL — PANTOPRAZOLE SODIUM 40 MG PO TBEC: 40 mg | ORAL | Qty: 1

## 2018-02-07 MED FILL — GABAPENTIN 300 MG PO CAPS: 300 mg | ORAL | Qty: 1

## 2018-02-07 MED FILL — SODIUM CHLORIDE 0.9 % IV SOLN: 0.9 % | INTRAVENOUS | Qty: 200

## 2018-02-07 MED FILL — FENTANYL 50 MCG/HR TD PT72: 50 ug/h | TRANSDERMAL | Qty: 1

## 2018-02-07 MED FILL — LORAZEPAM 1 MG PO TABS: 1 mg | ORAL | Qty: 1

## 2018-02-07 MED FILL — LIPITOR 20 MG PO TABS: 20 mg | ORAL | Qty: 1

## 2018-02-07 MED FILL — SERTRALINE HCL 50 MG PO TABS: 50 mg | ORAL | Qty: 1

## 2018-02-07 MED FILL — SENNOSIDES-DOCUSATE SODIUM 8.6-50 MG PO TABS: 8.6-50 mg | ORAL | Qty: 2

## 2018-02-07 NOTE — Plan of Care (Addendum)
Problem: Falls - Risk of:  Goal: Will remain free from falls  Outcome: Ongoing  Note:   Pt at risk for falls.  Fall risk protocol in place. Bed in low position. Bed brakes in place. Fall risk sign posted . Patient calls out appropriately. Patient in bed , side rails up x 2 and call light in reach. Will continue to monitor safety during hourly rounding.      Problem: Pain:  Description  Pain management should include both nonpharmacologic and pharmacologic interventions.  Goal: Pain level will decrease  Outcome: Ongoing  Note:   Patient c/o back pain . Percocet po 1 tab given. VSS. Patient in bed with no other complaints at this time .Will continue to monitor comfort.     Problem: Breathing Pattern - Ineffective:  Goal: Ability to achieve and maintain a regular respiratory rate will improve  Outcome: Ongoing  Note:   Expiratory wheezing noted. Productive moist cough. Patient on room air. O2 sat 95%. Patient denies SOB at this time. Will continue to monitor.

## 2018-02-07 NOTE — Progress Notes (Signed)
Pt. Up in bed, family visiting. Assessment complete, VSS, afebrile, medications given without difficulty. Remains A & O X 4. Call light an over bed table within reach, no s/sxof distress noted. Will lcontinue to monitor.

## 2018-02-07 NOTE — Progress Notes (Signed)
Patient is on a bed from [facility].The bed alarm is not working properly. Patient was offered a different bed with a functional bed alarm. Patient and family declined. Patient is alert and oriented and has not made any attempt to ambulate without assistance. Patient have been encourage prior to ambulating and follow my instructions during my caring for this pt.Will continue to monitor.

## 2018-02-07 NOTE — Progress Notes (Signed)
RESPIRATORY THERAPY ASSESSMENT    Name:  David Watkins  Medical Record Number:  [id number]  Age: 62 y.o.   Gender: male  DOB: 08/19/1956  Today's Date:  02/07/2018  Room:  6327/6327-01    Assessment     Is the patient being admitted for a COPD or Asthma exacerbation?  No   (If yes the patient will be seen every 4 hours for the first 24 hours and then reassessed)    Patient Admission Diagnosis      Allergies  Allergies   Allergen Reactions   . Lenalidomide Rash   . Pomalidomide Hives   . Ceclor [Cefaclor] Hives       Minimum Predicted Vital Capacity:     1054          Actual Vital Capacity:      2l              Pulmonary History: ex smoker  Home Oxygen Therapy: none  Home Respiratory Therapy: Albuterol Q4H PRN, Trelegy Ellipta daily  Current Respiratory Therapy:  HHN QID 1.25 xopenex, 3% sodium chloride, Spireva daily, Pulmicort BID, Perforomist BID  Treatment Type: MDI  Medications: Tiotropium    Respiratory Severity Index(RSI)   Patients with orders for inhalation medications, oxygen, or any therapeutic treatment modality will be placed on Respiratory Protocol.  They will be assessed with the first treatment and at least every 72 hours thereafter.  The following severity scale will be used to determine frequency of treatment intervention.    Smoking History: Smoking History Less than 1ppd or less than 15 pack year = 2  Social History  Social History     Tobacco Use   . Smoking status: Former Smoker     Last attempt to quit: 11/25/2012     Years since quitting: 5.2   . Smokeless tobacco: Never Used   Substance Use Topics   . Alcohol use: Yes     Comment: socially   . Drug use: No       Recent Surgical History: None = 0  Past Surgical History  Past Surgical History:   Procedure Laterality Date   . BONE MARROW BIOPSY     . BONE MARROW TRANSPLANT     . BRONCHOSCOPY N/A 02/03/2018    BRONCHOSCOPY WITH BAL, CHOICE ANESTHESIA performed by [name], MD at [facility] ENDOSCOPY   . BRONCHOSCOPY  02/03/2018    BRONCHOSCOPY  THERAPUTIC ASPIRATION SUBSEQUENT performed by [name], MD at [facility] ENDOSCOPY   . OTHER SURGICAL HISTORY Left 08/17/2016    trifusion cath placement   . PRE-MALIGNANT / BENIGN SKIN LESION EXCISION Left 03/29/2017    EXCISE LESION LEFT EXTERNAL EAR WITH FROZEN SECTION, FULL THICKNESS SKIN GRAFT       Level of Consciousness: Alert, Oriented, and Cooperative = 0    Level of Activity: Walking with assistance = 1    Respiratory Pattern: Regular Pattern; RR 8-20 = 0    Breath Sounds: Absent bilaterally and/or with wheezes = 3    Sputum  Sputum Color: Yellow, White, Tenacity: Thick, Sputum How Obtained: Spontaneous cough  Cough: Strong, spontaneous, non-productive = 0    Vital Signs   BP 132/71   Pulse 67   Temp 97.5 F (36.4 C)   Resp 20   Ht 5\' 9"  (1.753 m)   Wt 193 lb 14.4 oz (88 kg)   SpO2 96%   BMI 28.63 kg/m   SPO2 (COPD values may differ): = 0  Peak Flow (asthma only): not applicable = 0    RSI: 7-8 = BID and Q4HPRN (every four hours as needed) for dyspnea        Plan       Goals: medication delivery, mobilize retained secretions, volume expansion and improve oxygenation    Patient/caregiver was educated on the proper method of use for Respiratory Care Devices:  Yes      Level of patient/caregiver understanding able to:   ? Verbalize understanding   ? Demonstrate understanding       ? Teach back        ? Needs reinforcement       ?  No available caregiver               ?  Other:     Response to education:  Very Good     Is patient being placed on Home Treatment Regimen?  No     Does the patient have everything they need prior to discharge?  NA     Comments: Patient is requesting txsTID. He appears in no distress. Chart reviewed.    Plan of Care: Continue HHN TID and Q4H PRN 1.25 xopenex/ 3% sodium chloride, Spireva daily, Pulmicort BID, Perforomist BID    Electronically signed by [place] Natausha Jungwirth, RCP on 02/07/2018 at 9:36 AM    Respiratory Protocol Guidelines     1. Assessment and treatment by  Respiratory Therapy will be initiated for medication and therapeutic interventions upon initiation of aerosolized medication.  2. Physician will be contacted for respiratory rate (RR) greater than 35 breaths per minute. Therapy will be held for heart rate (HR) greater than 140 beats per minute, pending direction from physician.  3. Bronchodilators will be administered via Metered Dose Inhaler (MDI) with spacer when the following criteria are met:  a. Alert and cooperative     b. HR < 140 bpm  c. RR < 30 bpm                d. Can demonstrate a 2-3 second inspiratory hold  4. Bronchodilators will be administered via Hand Held Nebulizer [facility]) to patients when ANY of the following criteria are met  a. Incognizant or uncooperative          b. Patients treated with HHN at Home        c. Unable to demonstrate proper use of MDI with spacer     d. RR > 30 bpm   5. Bronchodilators will be delivered via Metered Dose Inhaler (MDI), HHN, Aerogen to intubated patients on mechanical ventilation.  6. Inhalation medication orders will be delivered and/or substituted as outlined below.    Aerosolized Medications Ordering and Administration Guidelines:    1. All Medications will be ordered by a physician, and their frequency and/or modality will be adjusted as defined by the patients Respiratory Severity Index (RSI) score.  2. If the patient does not have documented COPD, consider discontinuing anticholinergics when RSI is less than 9.  3. If the bronchospasm worsens (increased RSI), then the bronchodilator frequency can be increased to a maximum of every 4 hours.  If greater than every 4 hours is required, the physician will be contacted.  4. If the bronchospasm improves, the frequency of the bronchodilator can be decreased, based on the patient's RSI, but not less than home treatment regimen frequency.  5. Bronchodilator(s) will be discontinued if patient has a RSI less than 9 and has received no scheduled or as needed treatment  for  72  Hrs.    Patients Ordered on a Mucolytic Agent:    1. Must always be administered with a bronchodilator.    2. Discontinue if patient experiences worsened bronchospasm, or secretions have lessened to the point that the patient is able to clear them with a cough.    Anti-inflammatory and Combination Medications:    1. If the patient lacks prior history of lung disease, is not using inhaled anti-inflammatory medication at home, and lacks wheezing by examination or by history for at least 24 hours, contact physician for possible discontinuation.

## 2018-02-07 NOTE — Consults (Signed)
Patient: David Watkins  [id number]  Date: 02/07/2018      Chief Complaint: Weakness    History of Present Illness/Hospital Course:  David Watkins is a pleasant 62 year old male admitted 01/21/2018 with complaints of shortness of breath. He was admitted for further evaluation given his underlying MM. Diatherix showed parainfluenza, enterovirus, and acinetobacter. He was started on a slow prednisone wean; ID was consulted for antibiotic management. Discussed discharge planning with patient and spouse; they tell me he plans for discharge in the near future.      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     has a past surgical history that includes bone marrow biopsy; other surgical history (Left, 08/17/2016); bone marrow transplant; pre-malignant / benign skin lesion excision (Left, 03/29/2017); bronchoscopy (N/A, 02/03/2018); and bronchoscopy (02/03/2018).     reports that he quit smoking about 5 years ago. He has never used smokeless tobacco. He reports that he drinks alcohol. He reports that he does not use drugs.    family history includes Heart Attack in his brother; Heart Disease in his mother; Lung Cancer in his father.      REVIEW OF SYSTEMS:   CONSTITUTIONAL: negative for fevers, chills, diaphoresis, activity change, appetite change, fatigue, night sweats and unexpected weight change.   EYES: negative for blurred vision, eye discharge, visual disturbance and icterus  HEENT: negative for hearing loss, tinnitus, ear drainage, sinus pressure, nasal congestion, epistaxis and snoring  RESPIRATORY: Negative for hemoptysis, cough, sputum production  CARDIOVASCULAR: negative for chest pain, palpitations, exertional chest pressure/discomfort, edema, syncope  GASTROINTESTINAL: negative for nausea, vomiting, diarrhea, constipation, blood in stool and abdominal pain  GENITOURINARY: negative for frequency, dysuria, urinary  incontinence, decreased urine volume, and hematuria  HEMATOLOGIC/LYMPHATIC: negative for easy bruising, bleeding and lymphadenopathy  ALLERGIC/IMMUNOLOGIC: negative for recurrent infections, angioedema, anaphylaxis and drug reactions  ENDOCRINE: negative for weight changes and diabetic symptoms including polyuria, polydipsia and polyphagia  MUSCULOSKELETAL: negative for pain, joint swelling, decreased range of motion and muscle weakness  NEUROLOGICAL: negative for headaches, slurred speech, unilateral weakness  PSYCHIATRIC/BEHAVIORAL: negative for hallucinations, behavioral problems, confusion and agitation.     Physical Examination:  Vitals:   Patient Vitals for the past 24 hrs:   BP Temp Temp src Pulse Resp SpO2   02/07/18 1015 111/67 97.6 ??F (36.4 ??C) Oral 72 18 95 %   02/07/18 0906 -- -- -- -- -- 96 %   02/07/18 0330 132/71 97.5 ??F (36.4 ??C) -- 67 20 --   02/07/18 0040 127/77 98.2 ??F (36.8 ??C) Oral 80 20 95 %   02/06/18 2200 -- -- -- -- 16 96 %   02/06/18 2015 123/70 98.1 ??F (36.7 ??C) Oral 79 16 95 %   02/06/18 1815 124/74 97.8 ??F (36.6 ??C) Oral 71 18 96 %   02/06/18 1330 111/73 98 ??F (36.7 ??C) Oral 72 18 97 %     Mood: Stable  Const: No distress  ENT: Oral mucosa moist  Eyes: No discharge or injection  CV: Regular rate and rhythm, no murmur rub or gallop noted  Resp: No resp distress  GI: Soft, nontender, nondistended. Normal bowel sounds.  Neuro: Alert, oriented, appropriate. No cranial nerve deficits appreciated. Skin: No lesions or rashes noted.   MSK: No joint abnormalities noted.       Lab Results   Component Value Date    WBC 5.5 02/07/2018  HGB 7.5 (L) 02/07/2018    HCT 22.7 (L) 02/07/2018    MCV 93.8 02/07/2018    PLT 36 (LL) 02/07/2018     Lab Results   Component Value Date    INR 1.02 02/07/2018    INR 1.51 (H) 02/03/2018    INR 1.12 01/31/2018    PROTIME 11.6 02/07/2018    PROTIME 17.2 (H) 02/03/2018    PROTIME 12.8 01/31/2018     Lab Results   Component Value Date    CREATININE <0.5 (L) 02/07/2018     BUN 23 (H) 02/07/2018    NA 135 (L) 02/07/2018    K 4.7 02/07/2018    CL 97 (L) 02/07/2018    CO2 30 02/07/2018     Lab Results   Component Value Date    ALT 46 (H) 02/07/2018    AST 21 02/07/2018    ALKPHOS 102 02/07/2018    BILITOT 0.3 02/07/2018     CT Chest WO Contrast   Final Result   ??   Stable innumerable pulmonary nodules bilaterally favoring metastatic disease.   ??   Minimal decrease in size of dominant left axillary lymph node Jan 27, 2018   ??   Stable osteolytic lesions of the axial skeleton, likely metastatic.   ??   ??   ??   CT CHEST W CONTRAST   Final Result   1. Interval development of multiple pulmonary nodules seen scattered throughout both lungs. There is also some nodular areas of patchy consolidation within the left lung and some interstitial infiltrate within the right upper lobe periphery. Findings are    nonspecific but favor an inflammatory etiology such as an atypical pneumonia or bronchiolitis. Interval development of pulmonary metastatic disease is considered less likely.   2. Moderate emphysema.   3. Left axillary lymphadenopathy is mildly decreased in comparison the prior study.   4. Previously seen abnormal paraspinal soft tissue identified along the lower thoracic spine has decreased in size and only some small ill-defined nodular soft tissue remains. Findings are consistent with neoplastic disease which is responding to    therapy.   5. Stable osseous metastatic disease.   ??   XR CHEST PORTABLE   Final Result   ??   No acute pulmonary pathology or change from the prior study   ??   ??   ??   ??   ??   CTA PULMONARY W CONTRAST   Final Result   1.  No pulmonary embolism.   2.  Patchy groundglass opacities and tree-in-bud are seen throughout the    lungs which are new compared to prior examination.  This most likely    represents atypical infection/inflammation.  Clinical correlation is    recommended.   3.  Paraseptal and centrilobular emphysematous change of the lungs.   4.  Enlarged left  axillary lymph nodes are not significantly changed    compared to PET/CT on 01/10/2018.  This is worrisome for nodal metastasis.   5.  Scattered lucent lesions are seen throughout the ribs, spine, and    scapula.  Findings may represent multiple myeloma or osseous metastasis.       ??   XR CHEST STANDARD (2 VW)   Final Result   ??   No acute cardiopulmonary findings.   ??      Medical Cytology  FINAL DIAGNOSIS:    Lung, Lingula Bronchial Lavage:  NEGATIVE    - ??No malignant cells identified; population of squamous cells  with  atypia which appears to reflect reactive metaplastic changes is noted.  - ??Bronchial cells, macrophages and mild acute inflammation.  ??  Assessment:  1. MM   2. Possible bacterial and fungal multifocal pneumonia   3. Debility      Recommendations:  Patient telling me he plans to discharge home today or tomorrow. Will clarify with primary team, but in the interim will not pursue inpatient rehab.     Jennalee Greaves A. Kathleene Hazel, MD 02/07/2018, 1:08 PM

## 2018-02-07 NOTE — Progress Notes (Signed)
S: SOB and cough better. No chest pain. He wants to go home.     Tele: Sinus, PAC    O:  Physical Exam:  BP 132/71    Pulse 67    Temp 97.5 ??F (36.4 ??C)    Resp 20    Ht 5' 9"  (1.753 m)    Wt 193 lb 14.4 oz (88 kg)    SpO2 96%    BMI 28.63 kg/m??    General (appearance):  No acute distress  Eyes: anicteric   Neck: soft, No JVD  Ears/Nose/Mouth/Thorat: No cyanosis  CV: RRR   Respiratory:  + wheeze and rhonchi   GI: soft, non-tender, non-distended  Skin: Warm, dry. No rashes  Neuro/Psych: Alert and oriented x 3. Appropriate behavior  Ext:  No c/c. Tr BLE edema  Pulses:  2+ radial     I.O's= -1.4 liters    Weight  Admission: Weight: 194 lb 10.7 oz (88.3 kg)   Today: Weight: 193 lb 14.4 oz (88 kg)    CBC:   Recent Labs     02/05/18  0550 02/06/18  0625   WBC 5.1 4.4   HGB 8.1* 7.9*   HCT 24.0* 23.9*   MCV 93.1 95.3   PLT 50* 41*     BMP:   Recent Labs     02/05/18  0550 02/05/18  0710 02/06/18  0625 02/07/18  0630   NA 135*  --  138 135*   K 5.1  --  5.0 4.7   CL 98*  --  99 97*   CO2 28  --  29 30   PHOS  --  2.4* 2.7 3.4   BUN 21*  --  18 23*   CREATININE 0.5*  --  0.5* <0.5*     Mag:   Lab Results   Component Value Date    MG 1.80 02/07/2018     LIVER PROFILE:   Recent Labs     02/07/18  0630   AST 21   ALT 46*   BILIDIR <0.2   BILITOT 0.3   ALKPHOS 102     PT/INR:   Recent Labs     02/07/18  0630   PROTIME 11.6   INR 1.02     APTT:   Recent Labs     02/07/18  0630   APTT 24.3*     Pro-BNP:   Lab Results   Component Value Date    PROBNP 324 01/21/2018       Imaging:    02/01/2018 CT chest:     Stable innumerable pulmonary nodules bilaterally favoring metastatic disease.   ??   Minimal decrease in size of dominant left axillary lymph node Jan 27, 2018   ??   Stable osteolytic lesions of the axial skeleton, likely metastatic.     01/31/2018 ECG: NSR    01/27/2018 CT chest:  1. Interval development of multiple pulmonary nodules seen scattered throughout both lungs. There is also some nodular areas of patchy consolidation within  the left lung and some interstitial infiltrate within the right upper lobe periphery. Findings are   ??nonspecific but favor an inflammatory etiology such as an atypical pneumonia or bronchiolitis. Interval development of pulmonary metastatic disease is considered less likely.   2. Moderate emphysema.   3. Left axillary lymphadenopathy is mildly decreased in comparison the prior study.   4. Previously seen abnormal paraspinal soft tissue identified along the lower thoracic spine has decreased in size and only  some small ill-defined nodular soft tissue remains. Findings are consistent with neoplastic disease which is responding to    therapy.   5. Stable osseous metastatic disease.       01/21/2018 CTA PE     1. ??No pulmonary embolism.   2. ??Patchy groundglass opacities and tree-in-bud are seen throughout the    lungs which are new compared to prior examination. ??This most likely    represents atypical infection/inflammation. ??Clinical correlation is    recommended.   3. ??Paraseptal and centrilobular emphysematous change of the lungs.   4. ??Enlarged left axillary lymph nodes are not significantly changed    compared to PET/CT on 01/10/2018. ??This is worrisome for nodal metastasis.   5. ??Scattered lucent lesions are seen throughout the ribs, spine, and    scapula. ??Findings may represent multiple myeloma or osseous metastasis.       01/21/2018 TTE: EF 50-55%. No def RWMA (inferior and inferoseptum was normal). No sig valve regurgitation or stenosis.  ??  01/22/18 ECGs reviewed. Earlier ecg seemed to demonstrate acute inferior MI but Tns negative and further ecgs showed evolution other ST segments suggesting acute pericarditis.  ??  01/21/2018 CTA Chest: No PE. Tree-in-bud bilateral opacities suggesting atypical infection/inflammation. Enlarged L axillary notes unchanged. Ribs, spine, and scapula lesions suggesting MM/bony mets. No sig pericardial effusion noted.  ??  01/22/18 CXR: Hyperinflated lungs. No definite acute  consolidation    Assessment:    62 y.o. admitted with severe SOB.  -Acute pericarditis  -Multiple myeloma  -COPD  -Neutropenia  -Pneumonia with "tree in bud" appearance of CT chest  -Hyperlipidemia  -parox A fib with RVR      Plan:  -Keep K>4, Mg>2.   -Amio, diltiazem, and metoprolol   -Lipitor

## 2018-02-07 NOTE — Progress Notes (Signed)
Met with pt. Pt states that he has had high blood sugars since he "started taking prednisone". Reviewed with pt,target blood glucose values,signs & symptoms of hyperglycemia,signs,symptoms & treatment of hypoglycemia and the importance of eating every 4-5 hours. Pt verbalized understanding but needs reinforcement. Pt states that his girlfriend has type 1 diabetes..Staff to start working with pt with insulin administration this evening. Pt given a diabetes education booklet . Will continue to follow.

## 2018-02-07 NOTE — Care Coordination-Inpatient (Signed)
Case Management Daily Note:    Current Plan of Care: Plan for D/C home tomorrow.       PT AM-PAC: 18/24    Date: 6/10    OT AM-PAC:  20/24   Date: 6/5    DME needs: [facility] upon discharge       Discharge Plan: Home with [facility] (Patient would like [facility] [phone]). Patient will be at  [address], [place], [place] [address] upon discharge.     Tentative Discharge Date: 6/11    Current Barriers to Discharge: Medical Clearance, DME Order     Resources/Information given: N/A      Case Management Notes: SW met with patient and significant other at bedside. Plan per patient and SO is to discharge home with home health care. They would like [facility]. (SW notified LPN, Jerline Pain from [facility] and they are following).     Patient in need of [facility]. SW notified [facility] who is following to assist.         Marden Noble Doretta Remmert, MSW, LSW  Social Worker   [facility]   [phone]

## 2018-02-07 NOTE — Other (Signed)
02/07/18 1422   Encounter Summary   Services provided to: Patient and family together   Referral/Consult From: Patient   Continue Visiting   (es 6/10)   Complexity of Encounter Moderate   Length of Encounter 30 minutes   Spiritual/Religious   Type Spiritual support   Assessment Approachable   Intervention Active listening;Prayer;Placed on communion list   Who? fr dale   Why? pt wants communion b4 dc tomorrow   At Request Of patient   Outcome Receptive;Engaged in conversation

## 2018-02-07 NOTE — Consults (Addendum)
Infectious Diseases Inpatient Consult Note    Medical Student note - reviewed and modified, see Attending addendum at bottom    Reason for Consult:   Pneumonia, Parainfluenza virus  Requesting Physician:   Dr Hunt Oris   Primary Care Physician:  Harlene Salts, MD  History Obtained From:   Pt, EPIC    Admit Date: 01/21/2018  Hospital Day: 82    CHIEF COMPLAINT:       Chief Complaint   Patient presents with   ??? Shortness of Breath       HISTORY OF PRESENT ILLNESS:    David Watkins is a 62 y.o. M with PMH of Multiple Myeloma s/p DCEP chemo with recent PET 5/13 showing progression of disease, COPD, HLD who presented to [facility] on 5/24 with chest pain and SOB of a few days duration.     5/15 - Presented to [facility] ED with back pain and SOB. Admitted for pain management, chemotherapy and COPD exacerbation. He received Decadron a steroid taper for COPD. Afebrile, on valtrex ppx, s/p Diflucan 200 mg daily for thrush (01/13/18 - 01/18/18)    5/24 - Pt presented with worsening SOB, chest pain and increased fatigue. He had a influenza infection about 2 months ago and states he never fully recovered. Completed decadron taper one day prior. He has continued to have persistent SOB. Now also has pain in his chest that radiates to maxillary sinuses. Afebrile, tachycardia, WBC 0.2. CXR significant for no acute process. CTPA shows no PE but significant for new patchy groundglass opacity throughout the lungs concerning for atypical infection versus inflammation.   Pt admitted and started on Valtrex, Fluconazole, Meropenem.    5/25 - Code STEMI for chest pain and EKG changes. Initial troponin negative. EKGs were felt to represent likely pericarditis and decision was made to treat pt for active pericarditis.    5/28 - Fungitell and galactomannan positive. Fluconazole switched to Eraxis     6/06 - Bronchoscopy with therapeutic aspiration and lavage    6/08 - [facility]atherix showed Parainfluenza, Enterovirus, and Acinetobacter.     ID was consulted for  antibiotic management    Today (6/10) pt is feeling a lot better. He is on room air.   Denies fever, chills, worsening SOB, chest pain, abd pain, n/v, diarrhea.     Afebrile, WBC 5.5    Past Medical History:    Past Medical History:   Diagnosis Date   ??? Bone pain    ??? Hyperlipidemia    ??? Leukemia, plasma cell, in relapse ([place])    ??? Low back pain    ??? Multiple myeloma ([place])    ??? Neuropathy     chemo induced, feet     Past Surgical History:    Past Surgical History:   Procedure Laterality Date   ??? BONE MARROW BIOPSY     ??? BONE MARROW TRANSPLANT     ??? BRONCHOSCOPY N/A 02/03/2018    BRONCHOSCOPY WITH BAL, CHOICE ANESTHESIA performed by David Route, MD at [place]   ??? BRONCHOSCOPY  02/03/2018    BRONCHOSCOPY THERAPUTIC ASPIRATION SUBSEQUENT performed by David Route, MD at [place]   ??? OTHER SURGICAL HISTORY Left 08/17/2016    trifusion cath placement   ??? PRE-MALIGNANT / BENIGN SKIN LESION EXCISION Left 03/29/2017    EXCISE LESION LEFT EXTERNAL EAR WITH FROZEN SECTION, FULL THICKNESS SKIN GRAFT     Current Medications:    ??? levalbuterol  1.25 mg Nebulization TID   ??? [  START ON 02/08/2018] predniSONE  40 mg Oral Daily   ??? sodium chloride (Inhalant)  15 mL Nebulization BID   ??? phosphorus  500 mg Oral BID   ??? metoprolol tartrate  50 mg Oral BID   ??? amiodarone  200 mg Oral Daily   ??? guaiFENesin  600 mg Oral BID   ??? diltiazem  240 mg Oral Daily   ??? celecoxib  100 mg Oral BID   ??? insulin lispro  0-18 Units Subcutaneous TID WC   ??? insulin lispro  0-9 Units Subcutaneous Nightly   ??? anidulafungin  100 mg Intravenous Q24H   ??? atorvastatin  10 mg Oral Nightly   ??? calcium elemental  500 mg Oral BID   ??? fentaNYL  1 patch Transdermal Q72H   ??? gabapentin  300 mg Oral TID   ??? pantoprazole  40 mg Oral QAM AC   ??? sennosides-docusate sodium  2 tablet Oral Daily   ??? sertraline  50 mg Oral Daily   ??? valACYclovir  500 mg Oral BID   ??? meropenem  1 g Intravenous Q8H   ??? sodium chloride flush  10 mL Intravenous 2 times per day   ???  Saline Mouthwash  15 mL Swish & Spit 4x Daily AC & HS   ??? tiotropium  18 mcg Inhalation Daily   ??? budesonide  0.25 mg Nebulization BID    And   ??? formoterol  20 mcg Nebulization BID       Allergies:  Lenalidomide; Pomalidomide; and Ceclor [cefaclor]    Social History:    TOBACCO:    Quit 5 yrs ago  ETOH:    Occasional  DRUGS:   None     Patient lives at home    Family History:   No immunodeficiency    REVIEW OF SYSTEMS:   No fever / chills / sweats.  No weight loss.  No visual change, eye pain, eye discharge.    No oral lesion, sore throat, dysphagia.  + cough Denies sputum.   Denies chest pain, palpitations.  Denies n / v / abd pain.  No diarrhea.  Denies dysuria or change in urinary function.  Denies joint swelling or pain.  No myalgia, arthralgia.  Denies skin changes, itching  Denies focal weakness, sensory change or other neurologic symptom    Denies new / worse depression, psychiatric symptoms    PHYSICAL EXAM:      Vitals:    BP 117/71    Pulse 70    Temp 97.7 ??F (36.5 ??C) (Oral)    Resp 18    Ht 5' 9" (1.753 m)    Wt 193 lb 14.4 oz (88 kg)    SpO2 96%    BMI 28.63 kg/m??     GENERAL: No apparent distress, on room air     HEENT: Membranes moist, no oral lesion  NECK:  Supple, no JVD   LUNGS: Bilateral faint wheezes and rhonchi, no rales, no dullness  CARDIAC: RRR, no murmur appreciated  ABD:  + BS, soft, non-distend, non-tender   EXT:  No rash, trace 1+ edema, no lesions  NEURO: No focal neurologic findings  PSYCH: Orientation, sensorium, mood normal  LINES:  Peripheral iv    DATA:    Lab Results   Component Value Date    WBC 5.5 02/07/2018    HGB 7.5 (L) 02/07/2018    HCT 22.7 (L) 02/07/2018    MCV 93.8 02/07/2018    PLT 36 (LL) 02/07/2018  Lab Results   Component Value Date    CREATININE <0.5 (L) 02/07/2018    BUN 23 (H) 02/07/2018    NA 135 (L) 02/07/2018    K 4.7 02/07/2018    CL 97 (L) 02/07/2018    CO2 30 02/07/2018     Hepatic Function Panel:   Lab Results   Component Value Date    ALKPHOS 102  02/07/2018    ALT 46 02/07/2018    AST 21 02/07/2018    PROT 5.3 02/07/2018    PROT 6.6 04/24/2016    BILITOT 0.3 02/07/2018    BILIDIR <0.2 02/07/2018    IBILI see below 02/07/2018    LABALBU 2.9 02/07/2018     6/06 Cell count with diff, BAL   Color, Lavage Colorless  Colorless   Appearance BAL (Lavage) Clear     Clot Evaluation BAL see below     WBC/EPI Cells Bal 425  /cumm   RBC, BAL 25  /cumm   Segmented Neutrophils, BAL 55High   5 - 10 %   Lymphocytes, BAL 8  5 - 10 %   Macrophages, BAL 37Low   90 - 95 %   Number of Cells Counted BAL (Lavage) 200       Pathology:   6/06 Cytology BAL lingula   Lung, Lingula Bronchial Lavage: NEGATIVE  - ??No malignant cells identified; population of squamous cells with atypia which appears to reflect reactive metaplastic changes is noted.  - ??Bronchial cells, macrophages and mild acute inflammation.    Micro:  6/06 Resp Infection panel - [facility]      6/06 BAL Resp culture   CULTURE, RESPIRATORY No growth 36-48 hours     Gram Stain Result No WBCs or organisms seen     6/06 BAL Fungal culture: No Fungal elements seen  6/06 BAL AFB smear: No AFB observed by Fluorescent stain  6/06 Aspergillus Galact Ag: Negative   6/06 Legionella culture pending   6/06 Viral resp culture pending     5/28 Fungitell (1,3)-Beta-D-Glucan: Positive   5/28 Aspergillus Galacto Ag: Positive     5/25 BC 1&2: No growth after 5 days of incubation  5/25 Fungus culture, blood: No growth after 2 weeks of incubation  5/25 Throat fungal culture, yeast: No growth after 2 week/s of incubation, No Fungal elements seen  5/25 Throat culture, HSV: Negative     Imaging:   6/04 CT Chest   Impression   ??   Stable innumerable pulmonary nodules bilaterally favoring metastatic disease.   ??   Minimal decrease in size of dominant left axillary lymph node Jan 27, 2018   ??   Stable osteolytic lesions of the axial skeleton, likely metastatic     5/30 CT Chest  Impression   1. Interval development of multiple pulmonary nodules  seen scattered throughout both lungs.   There is also some nodular areas of patchy consolidation within the left lung and some interstitial infiltrate within the right upper lobe periphery.    Findings are nonspecific but favor an inflammatory etiology such as an atypical pneumonia or bronchiolitis.   Interval development of pulmonary metastatic disease is considered less likely.   2. Moderate emphysema.   3. Left axillary lymphadenopathy is mildly decreased in comparison the prior study.   4. Previously seen abnormal paraspinal soft tissue identified along the lower thoracic spine has decreased in size and only some small ill-defined nodular soft tissue remains.   Findings are consistent with neoplastic disease which is  responding to therapy.   5. Stable osseous metastatic disease.     5/25 CXR   Impression   ??   No acute pulmonary pathology or change from the prior study     5/25 CTPA  Impression   1. ??No pulmonary embolism.   2. ??Patchy groundglass opacities and tree-in-bud are seen throughout the lungs which are new compared to prior examination.   This most likely represents atypical infection/inflammation.    Clinical correlation is recommended.   3. ??Paraseptal and centrilobular emphysematous change of the lungs.   4. ??Enlarged left axillary lymph nodes are not significantly changed compared to PET/CT on 01/10/2018. ??   This is worrisome for nodal metastasis.   5. ??Scattered lucent lesions are seen throughout the ribs, spine, and scapula.    Findings may represent multiple myeloma or osseous metastasis.     5/24 CXR   Impression   ??   No acute cardiopulmonary findings.     IMPRESSION:      Patient Active Problem List   Diagnosis   ??? Multiple myeloma not having achieved remission ([place])   ??? COPD, severe ([place])   ??? Multiple myeloma in remission ([place])   ??? Moderate malnutrition ([place])   ??? Open wound of left ear   ??? Ear lesion   ??? Skin lesion   ??? 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 COPD exacerbation ([place])   ??? Pulmonary nodule   ??? Pain   ??? Pneumonia   ??? Acute respiratory failure with hypoxia and hypercapnia ([place])   ??? Neutropenia associated with infection ([place])   ??? Paroxysmal atrial fibrillation (HCC)   ??? Mixed hyperlipidemia   ??? Acute pericarditis   ??? Atypical pneumonia     David Watkins is a 62 y.o. M with PMH of Multiple Myeloma s/p DCEP chemo with recent PET 5/13 showing progression of disease, COPD, HLD who presented to [facility] on 5/24 with chest pain and SOB of a few days duration.    - Multiple myeloma with osseous lesions and progression   - leukopenia (resolved)   - resp panel + for Parainfluenza virus types 1,2,3,4, Enterovirus group, and Acinetobacter baumannii  - New radiographic patchy groundglass opacities and tree-in-bud nodules in the lungs   - negative cytology of BAL     RECOMMENDATIONS:    - cont meropenem, anidulafungin  - if resistant would start polymixins   - f/u cultures, sensitivities  - supportive tx for Parainfluenza, Enterovirus infection     Discussed with pt   Minna Antis, MS IV     Addendum to Medical Student Consult??note:  Pt seen,examined and evaluated. I have independently performed history, physical exam, lab and data review. ??I have determined assessment and plan as documented by student??(H Virk).    62 yo man with hx multiple myeloma, COPD.  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  + SOB, cough - non-productive or productive "mucous"  Dx influenza in March / April.  Admit 5/15 with COPD exacerbation.    Admit 5/24 with SOB.  CP and fatigue.  Afebrile,  WBC 0.2.    Started on Meropenem.      Serum + 1,3 BG, Galactomannan.  Bronch 6/6 - 'thick, tenacious secretions throughout he bilateral upper and lower lobe airways"  BAL cult neg.  BAL galactomannan negative.    [facility]rix /  multiple PCR + Parainfluenza, Enterovirus, Acetinetobacter    Today, afebrile.  WBC 5.5.  On RA, "much better"  respiratory status    IMP/  Multiple myeloma  COPD  Pul nodules / groundglass -- maybe from viral pneumonia  + fungal serum markers - 1,3BG, galactomannan  Resp viruses  on BAL PCR.  Likely pathogen   Acinetobacter on BAL PCR, not isolated on culture, likely colonizing and not pathogen    REC/  Cont anidulafungin - treat with this, micafungin or new azole after discharge  - voriconazole and amiodarone can lead to prolonged QTc, P450 3a4 metabolism may increase amio level  - isavuconazole has less drug interactions than vori    - echinocandins (anidula, micafungin) only iv     Would d/c off meropenem at diacharge, can use levofloxacin for prophylactic antibiotic    Will review CT with radiologist  Will f/u on culture - fungal cult held x 4 weeks    Discussed with pt, GF / SO  Ander Slade, MD

## 2018-02-07 NOTE — Progress Notes (Signed)
Physical Therapy  Facility/Department: [facility] TELEMETRY  Daily Treatment Note  NAME: David Watkins  DOB: [birth year]  MRN: [id number]    Date of Service: 02/07/2018    Discharge Recommendations: David Watkins scored a 18/24 on the AM-PAC short mobility form. Current research shows that an AM-PAC score of 18 or greater is typically associated with a discharge to the patient's home setting. Based on the patient???s AM-PAC score and their current functional mobility deficits, it is recommended that the patient have 2-3 sessions per week of Physical Therapy at d/c to increase the patient???s independence.      HOME HEALTH CARE: LEVEL 1 STANDARD    - Initial home health evaluation to occur within 24-48 hours, in patient home   - Therapy to evaluate with goal of regaining prior level of functioning   - Therapy to evaluate if patient has Home Health Aide needs for personal care      PT Equipment Recommendations  Equipment Needed: Yes  Walker: Rolling    Assessment   Body structures, Functions, Activity limitations: Decreased functional mobility ;Decreased endurance;Decreased strength;Decreased balance;Increased Pain  Assessment: Pt tolerated ambulation well.  Pt states he is not very steady with pain meds in his system but was able to ambulate with use of walker and CGA, no LOB or near LOB.  Pt states plan is to return home at d/c with assist.  Rec rolling walker and home PT.  Treatment Diagnosis: decreased independence with functional mobility  Prognosis: Good  Patient Education: role of PT, use of walker at d/c; pt demonstrated good understanding  REQUIRES PT FOLLOW UP: Yes     Patient Diagnosis(es): The primary encounter diagnosis was Atypical pneumonia. Diagnoses of Neutropenia associated with infection (HCC) and Sepsis due to pneumonia [facility]) were also pertinent to this visit.     has a past medical history of Bone pain, Hyperlipidemia, Leukemia, plasma cell, in relapse (HCC), Low back pain, Multiple myeloma (HCC), and  Neuropathy.   has a past surgical history that includes bone marrow biopsy; other surgical history (Left, 08/17/2016); bone marrow transplant; pre-malignant / benign skin lesion excision (Left, 03/29/2017); bronchoscopy (N/A, 02/03/2018); and bronchoscopy (02/03/2018).    Restrictions  Position Activity Restriction  Other position/activity restrictions: Up as tolerated  Subjective   General  Chart Reviewed: Yes  Additional Pertinent Hx: Pt is a 62 yo male who presented to the ER on with complaint of chest pain and shortness of breath. 6/6, Had a bronchoscopy. PMH: bone pain, HLD, leukemia, low back pain, multiple myeloma, neuropathy.   Referring Practitioner: Dr. Loyal Buba  Subjective  Subjective: Pt found supine in bed.  States he was up in chair for most of the morning.  Agreeable to PT.  Significant other present.  Pain Screening  Patient Currently in Pain: Yes(unrated back pain, chronic per pt, pt states he received pain meds ~1.5hrs ago)       Orientation  Orientation  Overall Orientation Status: Within Functional Limits  Cognition      Objective   Bed mobility  Supine to Sit: Stand by assistance  Sit to Supine: Stand by assistance  Transfers  Sit to Stand: Contact guard assistance  Stand to sit: Contact guard assistance  Ambulation 1  Device: Rolling Walker  Assistance: Contact guard assistance  Quality of Gait: slow, relies heavily on walker for support, decreased stride length, several brief standing rest breaks  Distance: 80 ft in room     Balance  Sitting - Static: Good  Sitting - Dynamic: Good  Standing - Static: Good  Standing - Dynamic: Fair(CGA with RW)                           G-Code     OutComes Score                                                     AM-PAC Score  AM-PAC Inpatient Mobility Raw Score : 18 (02/07/18 1418)  AM-PAC Inpatient T-Scale Score : 43.63 (02/07/18 1418)  Mobility Inpatient CMS 0-100% Score: 46.58 (02/07/18 1418)  Mobility Inpatient CMS G-Code Modifier : CK (02/07/18 1418)           Goals  Short term goals  Time Frame for Short term goals: by d/c  Short term goal 1: Pt will transfer supine <> sit independently ongoing  Short term goal 2: Pt will transfer sit <> stand MOD I ongoing  Short term goal 3: Pt will ambulate 200' with RW and MOD I ongoing  Patient Goals   Patient goals : Be able to independently get up and walk around without feeling SOB.    Plan    Plan  Times per week: 2-5  Current Treatment Recommendations: Strengthening, Stair training, Investment banker, operational, Location manager, Teaching laboratory technician, Building services engineer, Teacher, early years/pre, Tour manager Devices  Type of devices: Nurse notified, Call light within reach, Gait belt, Left in bed(pt left with alarm off, as he was found; RN aware and in agreement)     Therapy Time   Individual Concurrent Group Co-treatment   Time In 1350         Time Out 1413         Minutes 23               Timed Code Treatment Minutes:  23    Total Treatment Minutes:  23    If patient is discharged prior to next treatment, this note will serve as the discharge summary.  Catha Gosselin, PT, DPT  [phone]

## 2018-02-07 NOTE — Progress Notes (Signed)
[facility] Progress Note    02/07/2018     David Watkins    MRN: [id number]    DOB: 05-10-1956    SUBJECTIVE: feeling better and wanting to go home    ECOG PS:  (2) Ambulatory and capable of self care, unable to carry out work activity, up and about > 50% or waking hours    Isolation: None    Medications    Scheduled Meds:  ??? sodium chloride (Inhalant)  15 mL Nebulization BID   ??? phosphorus  500 mg Oral BID   ??? metoprolol tartrate  50 mg Oral BID   ??? predniSONE  25 mg Oral BID   ??? amiodarone  200 mg Oral Daily   ??? guaiFENesin  600 mg Oral BID   ??? diltiazem  240 mg Oral Daily   ??? celecoxib  100 mg Oral BID   ??? insulin lispro  0-18 Units Subcutaneous TID WC   ??? insulin lispro  0-9 Units Subcutaneous Nightly   ??? anidulafungin  100 mg Intravenous Q24H   ??? atorvastatin  10 mg Oral Nightly   ??? calcium elemental  500 mg Oral BID   ??? fentaNYL  1 patch Transdermal Q72H   ??? gabapentin  300 mg Oral TID   ??? pantoprazole  40 mg Oral QAM AC   ??? sennosides-docusate sodium  2 tablet Oral Daily   ??? sertraline  50 mg Oral Daily   ??? valACYclovir  500 mg Oral BID   ??? meropenem  1 g Intravenous Q8H   ??? sodium chloride flush  10 mL Intravenous 2 times per day   ??? Saline Mouthwash  15 mL Swish & Spit 4x Daily AC & HS   ??? tiotropium  18 mcg Inhalation Daily   ??? budesonide  0.25 mg Nebulization BID    And   ??? formoterol  20 mcg Nebulization BID   ??? levalbuterol  1.25 mg Nebulization Q4H WA     Continuous Infusions:  ??? dextrose     ??? sodium chloride 20 mL/hr at 02/05/18 1001     PRN Meds:.acetaminophen, diphenhydrAMINE, potassium phosphate IVPB, glucose, dextrose, glucagon (rDNA), dextrose, potassium chloride, magnesium sulfate, LORazepam, albuterol, bisacodyl, ondansetron, oxyCODONE-acetaminophen, prochlorperazine, sodium chloride flush, potassium chloride, magnesium sulfate, magnesium hydroxide, Saline Mouthwash, alteplase, LORazepam    ROS:  As noted above, otherwise remainder of 10-point ROS negative    Physical Exam:     I&O:       Intake/Output Summary (Last 24 hours) at 02/07/2018 0849  Last data filed at 02/07/2018 0643  Gross per 24 hour   Intake 720 ml   Output 2 ml   Net 718 ml       Vital Signs:  BP 132/71    Pulse 67    Temp 97.5 ??F (36.4 ??C)    Resp 20    Ht 5' 9"  (1.753 m)    Wt 193 lb 14.4 oz (88 kg)    SpO2 95%    BMI 28.63 kg/m??     Weight:    Wt Readings from Last 3 Encounters:   02/06/18 193 lb 14.4 oz (88 kg)   01/18/18 194 lb 9.6 oz (88.3 kg)   01/10/18 194 lb 12.8 oz (88.4 kg)         ??  General: Awake, alert and oriented.  HEENT:??normocephalic, alopecia,??PERRL, no scleral erythema or icterus, Oral mucosa moist and intact  LYMPH: ??Left axillary lymphadenopathy??  NECK: supple without palpable adenopathy  BACK: Straight  negative CVAT  SKIN:??warm dry and intact without lesions rashes or masses  CHEST:??rhonchi w/ insp/exp wheeze,??without use of accessory muscles  CV: Normal S1 S2, irregular, no MRG  ABD:??NT ND normoactive BS, no palpable masses or hepatosplenomegaly  EXTREMITIES:??without edema, denies calf tenderness  NEURO: CN II - XII grossly intact  CATHETER:??Right IJ PAC (06/21/17, [facility])??- CDI        Data    CBC:   Recent Labs     02/05/18  0550 02/06/18  0625   WBC 5.1 4.4   HGB 8.1* 7.9*   HCT 24.0* 23.9*   MCV 93.1 95.3   PLT 50* 41*     BMP/Mag:  Recent Labs     02/05/18  0550 02/05/18  0710 02/06/18  0625 02/07/18  0630   NA 135*  --  138 135*   K 5.1  --  5.0 4.7   CL 98*  --  99 97*   CO2 28  --  29 30   PHOS  --  2.4* 2.7 3.4   BUN 21*  --  18 23*   CREATININE 0.5*  --  0.5* <0.5*   MG  --  1.80 1.90 1.80     LIVP:   Recent Labs     02/07/18  0630   AST 21   ALT 46*   BILIDIR <0.2   BILITOT 0.3   ALKPHOS 102     Coags:   Recent Labs     02/07/18  0630   PROTIME 11.6   INR 1.02   APTT 24.3*     Uric Acid   Recent Labs     02/07/18  0630   LABURIC 1.8*       Diagnostics:  1. ??PET scan (01/10/18):    ??    2.  CTA Chest (525/19):  1. ??No pulmonary embolism.  2. ??Patchy groundglass opacities and tree-in-bud are seen  throughout the   lungs which are new compared to prior examination. ??This most likely   represents atypical infection/inflammation. ??Clinical correlation is   recommended.  3. ??Paraseptal and centrilobular emphysematous change of the lungs.  4. ??Enlarged left axillary lymph nodes are not significantly changed   compared to PET/CT on 01/10/2018. ??This is worrisome for nodal metastasis.  5. ??Scattered lucent lesions are seen throughout the ribs, spine, and   scapula. ??Findings may represent multiple myeloma or osseous metastasis.    3.  Echocardiogram (01/23/18):  ??Technically difficult examination.  ??Normal left ventricle size, wall thickness, and systolic function with an  ??estimated ejection fraction of 50-55%. No obvious regional wall motion  ??abnormalities are seen (inferior and inferoseptal walls do appear normal, as  ??clinically questioned). Diastolic filling parameters suggests normal  ??diastolic function.  ??Aortic valve appears sclerotic but opens adequately.  ??Possible small/organized pericardial effusion though fat pad cannot be ruled  ??out.    4.  CT Chest (01/31/18):  Stable innumerable pulmonary nodules bilaterally favoring metastatic disease.    Minimal decrease in size of dominant left axillary lymph node Jan 27, 2018    Stable osteolytic lesions of the axial skeleton, likely metastatic.    5.  Echocardiogram (02/06/18):  ??Normal left ventricle size, wall thickness, and systolic function with an  ??estimated ejection fraction of 50-55%. No obvious regional wall motion  ??abnormalities are seen (inferior and inferoseptal walls do appear normal, as  ??clinically questioned). Diastolic filling parameters suggests normal  ??diastolic function.  ??Aortic valve appears sclerotic but opens adequately.  ??Possible  small/organized pericardial effusion though fat pad cannot be ruled  ??out.    Myeloma Labs (01/03/18):  SPEP:????reveals that M2, previously characterized as monoclonal IgG kappa in  mid-to-slow gamma is 1.0 gm/dL,  greater than the 0.2 gm/dL observed on 08 Nov 2017.??  SIFE:????Serum immunofixation electrophoresis reveals persistent M2 in mid-to-slow gamma, a monoclonal IgG kappa. ??A recent M-spike, M1, was minor monoclonal IgG lambda in mid-gamma; M1 continues to be absent.  SFLC:  Kappa:  30.80, previously (11/08/17) -??10.40  Lambda:  1.46, previously (11/08/17) -??5.97  Free Kappa Lambda Ratio:  21.10, previously (11/08/17) -??1.74  Quantitative Immunoglobulins:  IgG:??  1480, previously (11/08/17) - 552  IgA:??  <26, previously (11/08/17) -??30  IgM:??  <20    Myeloma Labs (01/27/18):  Serum Light Chains: Pending    SPE/IFE:??M-protein persists in the gamma region on serum protein electrophoresis. The amount of this protein is 0.5 gm/dL     Quantitative Immunoglobulins:  IgG:??  791  IgA:??  17  IgM:??  11  ??  PROBLEM LIST: ??????????   ????  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 ??COPD   9.????Influenza A??(10/12/17)  10.  COPD Exacerbation (12/2017)  11.  Pericarditis (12/2017)  12.  A. Fib w/ RVR (12/2017)  ????  TREATMENT: ??????????   ??  1. Rad Tx to T5-7, T10-L3, Right Scapula - 3000 cGy - Dr. Jamas Lav 03/20/16-04/01/16  2. RVD x1 03/20/16 - discontinued d/t rash   3. Velcade/Pomalyst/Dex x3 cycles 04/23/16-06/17/16 - discontinued d/t reaction to pomalyst  4. Velcade/Dex x 1 cycle 06/26/16 (last dose of dexamethasone 07/22/16  5. High-dose melphalan followed by administration of PBSCs 2.36 x10^6 cd34cells/kg on 08/28/16  6. Maintenance Revlimid 30m daily (12/2016-04/23/17)  7. Dexamethasone 456mdaily (04/24/17)  8. DCEP   Cycle #1 - 04/26/17  Cycle #2 - 05/25/17 - excellent response  9.??Dara/Velcade/Dex (C1D1 - 06/22/17) - BMBx ??10/18/17 - CR  Cycles 7-8 (21 day cycle)  - Dex 20 mg po D4,5.8,9,11 and 12  - Dara 16 mg/Kg D1 only with Dex 20 mg  IV  - Velcade 1.3 mg/M2 D1,4,8 and 11  Cycle 9 and beyond (maintenance, 11/08/17??- 01/03/18)  Dara 16 mg/Kg Q 28 days  Dex 12 mg IV D1  Velcade Q 2 weeks??  10. DCEP   Cycle #1 - 01/13/18  ??  ASSESSMENT AND PLAN:??????????   ??  1. ??IgG kappa multiple myeloma / Plasma Cell Leukemia:??Relapsed disease??  - S/p??treatment??Dara/Velcade/Dex??x 8 cycles??(06/22/17 - 10/2017). Followed by??maintenance Dara??Q4 wks/Velcade??Q2ks/Dex??(started 11/08/17)  - Now w/ progressive disease based on myeloma labs (01/03/18) & PET scan (01/10/18), see above??  ??  PLAN:??DCEP for disease and pain control. Hope for allogeneic transplant in CR2, but needs to disease response and improvement in lung function??    - M-spike (01/27/18) - 0.5 g/dL, previously 1.0 g/dL  ??  DCEP Cycle #1, Day + 27 - Change to outpatient therapy when he gets stronger. Probably later this week.  ??  2. ??ID: Afebrile w/ possible bacterial and fungal multifocal PNA  - Fungitell and galactomannan (01/25/18) - Positive   - Cont Valtrex ppx  - Cont Meropenem Day + 18, consider stopping   - Eraxis Day + 15  - Bronch / Diatherix (02/03/18) - parainfluenza virus, Acinetobacter baumanni, Adenovirus  - Consult ID to address treatment for Acinetobacter     3.  Heme:??Chemotherapy induced pancytopenia   - Transfuse for Hgb < 7 and Platelets <??10K  - S/p Neulasta (01/19/18)  - No transfusion today   ??  4.??Metabolic: Stable renal fxn and e-lytes except for steroid-induced hyperglycemia, hypoNa   - Cont high regimen Lispro SSI, AC&HS, consult diabetes educator  - S/p Lasix 40 mg IV daily (01/25/18 - 01/30/18)   - Cont KPhos 500 mg bid (started 02/04/18)  - Cont IVF:  NS @ 20 mL/hr  - Keep Mg > 2 and K+ > 4.0 and Phos > 2.0    5. Pulmonary:??Acute respiratory distress with mild hypercapnia and hypoxemia from COPD exacerbation and underlying PNA has resolved.  He also is being treated for bacterial and fungal PNA which was likely contributing to acute respiratory failure   - Pulm Nodule: Stable??on??CT chest  from??04/23/17??&??10/18/17 &??11/26/17; cont to monitor.   - CTPA??(11/26/17): No PE;??resolution of??mild upper lobe tree-in-bud opacities; Mild emphysema. Two 3 mm LLL pulmonary nodules, stable  - PFT (12/27/17):????compared to??07/2016:??FVC 3.25 L, 66%, and FEV1  1.42 L, 38%. FEV1/FVC is 44%. This is unchanged from prior study. Air trapping seen on lung volumes. Diffusion capacity 3.91, 79% of predicted, down from 5.21??  - CTPA (01/22/18):  No PE, patchy groundglass opacities and tree-in-bud are seen throughout the  lungs which are new compared to prior examination.   - CT chest (01/31/18): Stable innumerable pulmonary nodules bilaterally favoring metastatic disease.  - Pulm following, appreciate recs. Cont regular f/u w/ Dr. Dorann Lodge on d/c in 2 - 3 weeks   - Cont home inhalers    - Cont Xopenox q4hrs  - Cont Steroid taper: Solumedrol 80 mg TID (started 01/22/18), 40 mg TID (01/25/18), 40 mg bid (01/26/18), decrease/change Pred 25 mg bid (02/03/18), change to 40 mg daily x 5 days, 30 mg daily x 5 days, 20 mg daily  X 5 days, 10 mg daily x 5 days, 5 mg daily x 5 days, then stop  - Bronch / Diatherix (02/03/18) - parainfluenza virus, Acinetobacter baumanni, Adenovirus  - Consult ID to address     ??  6. GI/Nutrition: Appetite and oral intake is fair  - Cont PPI ppx w/ steroids??  - Cont low microbial diet ??  Constipation: ??Improved  - Cont Dulcolax prn & Senokot-s 2 tabs daily    ??  7. 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  - Echo (01/23/18):  LVEF 50-55# w/ normal diastolic fxn & no obvious regional wall motion abnormalities  - Cardiology following, appreciate recs  - Cont Lipitor??  - S/p Dilt gtt (stopped 01/27/18) & Amio gtt (stopped 01/31/18), cont Diltiazem CD 240 mg daily, Amiodarone 200 mg daily & Lopressor 25 mg bid  - Cont steroids for COPD exacerbation, pericarditis resolved   - Cont Celebrex (started 01/26/18)  ??  8. Psych:  Ongoing concerns about disease progression and ability to get transplant    Anxiety/Depression: Ongoing  - Cont Zoloft??50 mg daily??  - Psych to follow   Insomnia:??No complaints??currently??  ??  9. Peripheral Neuropathy:??Ongoing  - Cont Gabapentin 300 mg??TID  ??  10. Bone Health:??No acute fx   -??H/o spinal cord compression & diffuse lytic lesions t/o skeleton  - CT Chest (04/23/17) - multiple lytic lesions throughout the skeleton   - Cont Ca/Vit D &??Zometa monthly (given??01/03/18) - next dose when discharged  ??  11.??Acute on??Chronic left lower back pain d/t Neoplasm:????Improved w/ chemotherapy  - S/p Radiation (09/01/17 - 09/15/17,??Maceo Pro)  -  Cont Fentanyl patch??50 mcg/hr (increased 01/13/18, Rx 01/18/18)  - Cont Percocet 7.5/325 mg q4hrs prn    12.  Steroid induced myopathy:  Ongoing   - Fall (02/01/18)  - Cont PT/OT  - Cont steroid taper    - DVT Prophylaxis: Platelets <50,000 cells/dL - prophylactic lovenox on hold and mechanical prophylaxis with bilateral SCDs while in bed in place.  Contraindications to pharmacologic prophylaxis: Thrombocytopenia  Contraindications to mechanical prophylaxis: None    - Disposition: Hopefully tomorrow, making preparations    Elenor Quinones, APRN    Juliann Mule. Derrill Kay, [place]  [place]

## 2018-02-07 NOTE — Progress Notes (Signed)
Pulmonary Followup Note    CC: COPD exacerbation, pneumonia, neutropenic fever and myeloma  Subjective:  Had bronchoscopy Thursday with therapeutic aspiration and lavage.  He is feeling a lot better. He is afebrile and on RA. He is hoping to go home today.    ROS:  Denies headache, nausea or chest pain.    24HR INTAKE/OUTPUT:      Intake/Output Summary (Last 24 hours) at 02/07/2018 1237  Last data filed at 02/07/2018 1021  Gross per 24 hour   Intake 720 ml   Output 2 ml   Net 718 ml       ??? levalbuterol  1.25 mg Nebulization TID   ??? sodium chloride (Inhalant)  15 mL Nebulization BID   ??? phosphorus  500 mg Oral BID   ??? metoprolol tartrate  50 mg Oral BID   ??? predniSONE  25 mg Oral BID   ??? amiodarone  200 mg Oral Daily   ??? guaiFENesin  600 mg Oral BID   ??? diltiazem  240 mg Oral Daily   ??? celecoxib  100 mg Oral BID   ??? insulin lispro  0-18 Units Subcutaneous TID WC   ??? insulin lispro  0-9 Units Subcutaneous Nightly   ??? anidulafungin  100 mg Intravenous Q24H   ??? atorvastatin  10 mg Oral Nightly   ??? calcium elemental  500 mg Oral BID   ??? fentaNYL  1 patch Transdermal Q72H   ??? gabapentin  300 mg Oral TID   ??? pantoprazole  40 mg Oral QAM AC   ??? sennosides-docusate sodium  2 tablet Oral Daily   ??? sertraline  50 mg Oral Daily   ??? valACYclovir  500 mg Oral BID   ??? meropenem  1 g Intravenous Q8H   ??? sodium chloride flush  10 mL Intravenous 2 times per day   ??? Saline Mouthwash  15 mL Swish & Spit 4x Daily AC & HS   ??? tiotropium  18 mcg Inhalation Daily   ??? budesonide  0.25 mg Nebulization BID    And   ??? formoterol  20 mcg Nebulization BID           PHYSICAL EXAMINATION:  BP 111/67    Pulse 72    Temp 97.6 ??F (36.4 ??C) (Oral)    Resp 18    Ht 5' 9"  (1.753 m)    Wt 193 lb 14.4 oz (88 kg)    SpO2 95%    BMI 28.63 kg/m??   CURRENT PULSE OXIMETRY:  SpO2: 95 %  24HR PULSE OXIMETRY RANGE:  SpO2  Avg: 95.7 %  Min: 95 %  Max: 97 % on ra    Tm 98.2   On RA  Gen: mild distress. Speaking in full  sentences with trace accessory muscle use  HEENT: PERRL, EOMI, OP nl  Lung: faint expiratory wheezing throughout  CV: RRR without M/R/R  Abd: +BS, soft, NT/ND  Ext: No edema.    DATA  CBC:   Recent Labs     02/05/18  0550 02/06/18  0625 02/07/18  0630   WBC 5.1 4.4 5.5   HGB 8.1* 7.9* 7.5*   HCT 24.0* 23.9* 22.7*   MCV 93.1 95.3 93.8   PLT 50* 41* 36*     BMP:   Recent Labs     02/05/18  0550 02/05/18  0710 02/06/18  0625 02/07/18  0630   NA 135*  --  138 135*   K 5.1  --  5.0 4.7   CL 98*  --  99 97*   CO2 28  --  29 30   PHOS  --  2.4* 2.7 3.4   BUN 21*  --  18 23*   CREATININE 0.5*  --  0.5* <0.5*     No results for input(s): PHART, PCO2ART, PO2ART in the last 72 hours.  LIVER PROFILE:   Recent Labs     02/07/18  0630   AST 21   ALT 46*   BILIDIR <0.2   BILITOT 0.3   ALKPHOS 102       CXR REVIEWED BY ME AND SHOWED:  CT Chest WO Contrast   Final Result      Stable innumerable pulmonary nodules bilaterally favoring metastatic disease.      Minimal decrease in size of dominant left axillary lymph node Jan 27, 2018      Stable osteolytic lesions of the axial skeleton, likely metastatic.            CT CHEST W CONTRAST   Final Result   1. Interval development of multiple pulmonary nodules seen scattered throughout both lungs. There is also some nodular areas of patchy consolidation within the left lung and some interstitial infiltrate within the right upper lobe periphery. Findings are    nonspecific but favor an inflammatory etiology such as an atypical pneumonia or bronchiolitis. Interval development of pulmonary metastatic disease is considered less likely.   2. Moderate emphysema.   3. Left axillary lymphadenopathy is mildly decreased in comparison the prior study.   4. Previously seen abnormal paraspinal soft tissue identified along the lower thoracic spine has decreased in size and only some small ill-defined nodular soft tissue remains. Findings are consistent with neoplastic disease which is responding to     therapy.   5. Stable osseous metastatic disease.      XR CHEST PORTABLE   Final Result      No acute pulmonary pathology or change from the prior study                  CTA PULMONARY W CONTRAST   Final Result   1.  No pulmonary embolism.   2.  Patchy groundglass opacities and tree-in-bud are seen throughout the    lungs which are new compared to prior examination.  This most likely    represents atypical infection/inflammation.  Clinical correlation is    recommended.   3.  Paraseptal and centrilobular emphysematous change of the lungs.   4.  Enlarged left axillary lymph nodes are not significantly changed    compared to PET/CT on 01/10/2018.  This is worrisome for nodal metastasis.   5.  Scattered lucent lesions are seen throughout the ribs, spine, and    scapula.  Findings may represent multiple myeloma or osseous metastasis.          XR CHEST STANDARD (2 VW)   Final Result      No acute cardiopulmonary findings.         Medical Cytology  FINAL DIAGNOSIS:    Lung, Lingula Bronchial Lavage:  NEGATIVE    - ??No malignant cells identified; population of squamous cells with  atypia which appears to reflect reactive metaplastic changes is noted.  - ??Bronchial cells, macrophages and mild acute inflammation.    ASSESSMENT/PLAN:  This is a 62 y.o. male with Fever, pulmonary nodules and multiple myeloma    Diatherix shows Parainfluenza, Enterovirus and Acinetobacter Baumannii.   Cytology negative  Bacterial  and fungal cultures NGTD  Continue slow prednisone wean - change to pred 40 mg daily x 5 days then 20 mg daily x 5 days then 10 ng daily x 5 days then 5 mg daily x 5 days then stop  Resume Home Trelegy and prn albuterol MDI/HHN  ID consulted to help with treatment of Aspergillus and Acineobacter  Ok to D/C from [employer] standpoint.  F/U with Dr. Dorann Lodge in 2 - 3 weeks or sooner if neded    Sherril Cong, MD

## 2018-02-07 NOTE — Plan of Care (Signed)
Problem: Falls - Risk of:  Goal: Will remain free from falls  Description  Will remain free from falls  Outcome: Ongoing  Note:   Patient uses call light appropriately to express needs. Bed to lowest position with door open and call light in reach. All fall precautions implemented at this time. Refusing bad alarm, calls appropriately. Siderails up x2. Non skid footwear in place. Patient has had no falls this shift. Will continue to monitor.    Goal: Absence of physical injury  Description  Absence of physical injury  Outcome: Ongoing     Problem: Pain:  Goal: Pain level will decrease  Description  Pain level will decrease  Outcome: Ongoing  Note:   Pain/discomfort being managed with PRN analgesics per MD orders. Pt able to express presence and absence of pain and rate pain appropriately using numerical scale.    Goal: Control of acute pain  Description  Control of acute pain  Outcome: Ongoing  Goal: Control of chronic pain  Description  Control of chronic pain  Outcome: Ongoing     Problem: Nutrition  Goal: Optimal nutrition therapy  Outcome: Ongoing     Problem: Cardiac:  Goal: Ability to maintain vital signs within normal range will improve  Description  Ability to maintain vital signs within normal range will improve  Outcome: Ongoing     Problem: Bleeding:  Goal: Will show no signs and symptoms of excessive bleeding  Description  Will show no signs and symptoms of excessive bleeding  Outcome: Ongoing  Note:   Scattered bruising to BUE.      Problem: Breathing Pattern - Ineffective:  Goal: Ability to achieve and maintain a regular respiratory rate will improve  Description  Ability to achieve and maintain a regular respiratory rate will improve  Outcome: Ongoing     Problem: PROTECTIVE PRECAUTIONS  Goal: Patient will remain free of nosocomial Infections  Outcome: Ongoing

## 2018-02-08 LAB — CBC WITH AUTO DIFFERENTIAL
Bands Relative: 2 % (ref 0–7)
Basophils %: 0 %
Basophils Absolute: 0 10*3/uL (ref 0.0–0.2)
Eosinophils %: 0 %
Eosinophils Absolute: 0 10*3/uL (ref 0.0–0.6)
Hematocrit: 22.8 % — ABNORMAL LOW (ref 40.5–52.5)
Hemoglobin: 7.7 g/dL — ABNORMAL LOW (ref 13.5–17.5)
Lymphocytes %: 7 %
Lymphocytes Absolute: 0.3 10*3/uL — ABNORMAL LOW (ref 1.0–5.1)
MCH: 31.9 pg (ref 26.0–34.0)
MCHC: 33.6 g/dL (ref 31.0–36.0)
MCV: 95 fL (ref 80.0–100.0)
MPV: 7.9 fL (ref 5.0–10.5)
Monocytes %: 8 %
Monocytes Absolute: 0.4 10*3/uL (ref 0.0–1.3)
Neutrophils %: 83 %
Neutrophils Absolute: 4.2 10*3/uL (ref 1.7–7.7)
Platelets: 29 10*3/uL — CL (ref 135–450)
RBC: 2.4 M/uL — ABNORMAL LOW (ref 4.20–5.90)
RDW: 18.1 % — ABNORMAL HIGH (ref 12.4–15.4)
WBC: 4.9 10*3/uL (ref 4.0–11.0)

## 2018-02-08 LAB — BASIC METABOLIC PANEL
Anion Gap: 8 (ref 3–16)
BUN: 27 mg/dL — ABNORMAL HIGH (ref 7–20)
CO2: 30 mmol/L (ref 21–32)
Calcium: 8.7 mg/dL (ref 8.3–10.6)
Chloride: 98 mmol/L — ABNORMAL LOW (ref 99–110)
Creatinine: 0.6 mg/dL — ABNORMAL LOW (ref 0.8–1.3)
GFR African American: >60 (ref >60)
GFR Non-African American: >60 (ref >60)
Glucose: 151 mg/dL — ABNORMAL HIGH (ref 70–99)
Potassium: 4.6 mmol/L (ref 3.5–5.1)
Sodium: 136 mmol/L (ref 136–145)

## 2018-02-08 LAB — POCT GLUCOSE
POC Glucose: 166 mg/dl — ABNORMAL HIGH (ref 70–99)
POC Glucose: 150 mg/dl — ABNORMAL HIGH (ref 70–99)
POC Glucose: 113 mg/dl — ABNORMAL HIGH (ref 70–99)

## 2018-02-08 LAB — MAGNESIUM: Magnesium: 1.8 mg/dL (ref 1.80–2.40)

## 2018-02-08 LAB — PHOSPHORUS: Phosphorus: 3.3 mg/dL (ref 2.5–4.9)

## 2018-02-08 MED ORDER — K PHOS MONO-SOD PHOS DI & MONO 155-852-130 MG PO TABS
155-852-130 MG | ORAL_TABLET | Freq: Two times a day (BID) | ORAL | 3 refills | Status: DC
Start: 2018-02-08 — End: 2018-04-19

## 2018-02-08 MED ORDER — ISAVUCONAZONIUM SULFATE 186 MG PO CAPS
186 | ORAL_CAPSULE | Freq: Every day | ORAL | 3 refills | 23.50000 days | Status: DC
Start: 2018-02-08 — End: 2018-04-19

## 2018-02-08 MED ORDER — DILTIAZEM HCL ER COATED BEADS 240 MG PO CP24
240 MG | ORAL_CAPSULE | Freq: Every day | ORAL | 3 refills | Status: DC
Start: 2018-02-08 — End: 2018-04-19

## 2018-02-08 MED ORDER — HEPARIN SOD (PORK) LOCK FLUSH 100 UNIT/ML IV SOLN
100 UNIT/ML | Freq: Once | INTRAVENOUS | Status: AC
Start: 2018-02-08 — End: 2018-02-08
  Administered 2018-02-08: 18:00:00 500 [IU]

## 2018-02-08 MED ORDER — DIPHENHYDRAMINE HCL 25 MG PO TABS
25 MG | ORAL | Status: AC | PRN
Start: 2018-02-08 — End: 2018-03-10

## 2018-02-08 MED ORDER — METOPROLOL TARTRATE 50 MG PO TABS
50 MG | ORAL_TABLET | Freq: Two times a day (BID) | ORAL | 3 refills | Status: DC
Start: 2018-02-08 — End: 2018-02-23

## 2018-02-08 MED ORDER — INSULIN LISPRO 100 UNIT/ML SC SOPN
100 UNIT/ML | PEN_INJECTOR | Freq: Every evening | SUBCUTANEOUS | 3 refills | Status: DC
Start: 2018-02-08 — End: 2018-02-28

## 2018-02-08 MED ORDER — GUAIFENESIN ER 600 MG PO TB12
600 MG | Freq: Two times a day (BID) | ORAL | Status: DC
Start: 2018-02-08 — End: 2018-02-28

## 2018-02-08 MED ORDER — INSULIN LISPRO 100 UNIT/ML SC SOPN
100 UNIT/ML | PEN_INJECTOR | Freq: Three times a day (TID) | SUBCUTANEOUS | 3 refills | Status: DC
Start: 2018-02-08 — End: 2018-02-28

## 2018-02-08 MED ORDER — AMIODARONE HCL 200 MG PO TABS
200 MG | ORAL_TABLET | Freq: Every day | ORAL | 3 refills | Status: AC
Start: 2018-02-08 — End: ?

## 2018-02-08 MED ORDER — PREDNISONE 10 MG PO TABS
10 MG | ORAL_TABLET | ORAL | 1 refills | Status: DC
Start: 2018-02-08 — End: 2018-02-28

## 2018-02-08 MED FILL — VALACYCLOVIR HCL 500 MG PO TABS: 500 mg | ORAL | Qty: 1

## 2018-02-08 MED FILL — PANTOPRAZOLE SODIUM 40 MG PO TBEC: 40 mg | ORAL | Qty: 1

## 2018-02-08 MED FILL — SENNOSIDES-DOCUSATE SODIUM 8.6-50 MG PO TABS: 8.6-50 mg | ORAL | Qty: 2

## 2018-02-08 MED FILL — GABAPENTIN 300 MG PO CAPS: 300 mg | ORAL | Qty: 1

## 2018-02-08 MED FILL — DILTIAZEM HCL ER COATED BEADS 240 MG PO CP24: 240 mg | ORAL | Qty: 1

## 2018-02-08 MED FILL — PERFOROMIST 20 MCG/2ML IN NEBU: 20 MCG/2ML | RESPIRATORY_TRACT | Qty: 2

## 2018-02-08 MED FILL — PREDNISONE 20 MG PO TABS: 20 mg | ORAL | Qty: 2

## 2018-02-08 MED FILL — OYSTER SHELL CALCIUM 500 MG PO TABS: 500 mg | ORAL | Qty: 1

## 2018-02-08 MED FILL — CELEBREX 100 MG PO CAPS: 100 mg | ORAL | Qty: 1

## 2018-02-08 MED FILL — SERTRALINE HCL 50 MG PO TABS: 50 mg | ORAL | Qty: 1

## 2018-02-08 MED FILL — LORAZEPAM 2 MG/ML IJ SOLN: 2 mg/mL | INTRAMUSCULAR | Qty: 1

## 2018-02-08 MED FILL — AMIODARONE HCL 200 MG PO TABS: 200 mg | ORAL | Qty: 1

## 2018-02-08 MED FILL — MUCINEX 600 MG PO TB12: 600 mg | ORAL | Qty: 1

## 2018-02-08 MED FILL — BUDESONIDE 0.25 MG/2ML IN SUSP: 0.25 MG/2ML | RESPIRATORY_TRACT | Qty: 2

## 2018-02-08 MED FILL — LEVALBUTEROL HCL 1.25 MG/0.5ML IN NEBU: 1.25 MG/0.5ML | RESPIRATORY_TRACT | Qty: 1

## 2018-02-08 MED FILL — ERAXIS 100 MG IV SOLR: 100 mg | INTRAVENOUS | Qty: 30

## 2018-02-08 MED FILL — PHOSPHA 250 NEUTRAL 155-852-130 MG PO TABS: 155-852-130 mg | ORAL | Qty: 2

## 2018-02-08 MED FILL — METOPROLOL TARTRATE 50 MG PO TABS: 50 mg | ORAL | Qty: 1

## 2018-02-08 MED FILL — LIPITOR 20 MG PO TABS: 20 mg | ORAL | Qty: 1

## 2018-02-08 MED FILL — OXYCODONE-ACETAMINOPHEN 7.5-325 MG PO TABS: 7.5-325 mg | ORAL | Qty: 1

## 2018-02-08 MED FILL — MEROPENEM 1 G IV SOLR: 1 g | INTRAVENOUS | Qty: 1

## 2018-02-08 MED FILL — HEPARIN LOCK FLUSH 100 UNIT/ML IV SOLN: 100 [IU]/mL | INTRAVENOUS | Qty: 5

## 2018-02-08 MED FILL — LORAZEPAM 1 MG PO TABS: 1 mg | ORAL | Qty: 1

## 2018-02-08 NOTE — Discharge Summary (Signed)
[facility] Discharge Summary             Attending Physician: Harlene Salts, MD    Name: David Watkins DOB:  August 03, 1956  MRN:  [id number]    Admission: 01/21/2018   Discharge:   02/08/18    Date: 02/08/2018    Diagnosis on admit: Chest Pain and shortness of breath     Procedures: Routine chest x-ray, CTA chest, echocardiogram x 2, CT chest,   laboratories, EKG, IV fluid hydration, Panculture for fevers, IV antimicrobial therapy, Respiratory therapy, Oxygen therapy, Blood Product Infusions    Consultations:   1.  Pulmonary:  Dr. Shirlean Kelly  2.  Cardiology:  Dr. Reather Laurence  3.  Physical Medicine and Rehab: Dr. Nance Pew  4.  Infectious Disease:  Dr. Ander Slade      Medications:    [name], Rise   Home Medication Instructions ZYS:[id number]    Printed on:02/08/18 1230   Medication Information                      albuterol sulfate HFA (PROAIR HFA) 108 (90 Base) MCG/ACT inhaler  Inhale 2 puffs into the lungs every 6 hours as needed for Wheezing             amiodarone (CORDARONE) 200 MG tablet  Take 1 tablet by mouth daily             atorvastatin (LIPITOR) 10 MG tablet  Take 10 mg by mouth nightly              bisacodyl (DULCOLAX) 5 MG EC tablet  Take 2 tablets by mouth daily as needed for Constipation             calcium carbonate (OSCAL) 500 MG TABS tablet  Take 500 mg by mouth 2 times daily              diltiazem (CARDIZEM CD) 240 MG extended release capsule  Take 1 capsule by mouth daily             diphenhydrAMINE (BENADRYL) 25 MG tablet  Take 1 tablet by mouth as needed (30 minutes prior to platelets)             fentaNYL (DURAGESIC) 50 MCG/HR  Place 1 patch onto the skin every 72 hours for 30 days.             Fluticasone-Umeclidin-Vilant (TRELEGY ELLIPTA) 100-62.5-25 MCG/INH AEPB  Inhale 1 puff into the lungs daily             gabapentin (NEURONTIN) 300 MG capsule  Take 1 capsule by mouth 3 times daily for 30 days.Marland Kitchen             guaiFENesin (MUCINEX) 600 MG extended release tablet  Take 1 tablet by  mouth 2 times daily             insulin lispro (HUMALOG) 100 UNIT/ML pen  Inject 0-18 Units into the skin 3 times daily (with meals)  **High Dose Corrective Algorithm**   Glucose: Dose:   70-139 ?? ?? ?? ?? ?? ??No Insulin   140-199 ?? ?? ?? ?? ?? 3 Units   200-249 6 Units   250-299 9 Units   300-349 12 Units   350-400 15 Units   Over 400 18 Units              insulin lispro (HUMALOG) 100 UNIT/ML pen  Inject 0-9 Units into the skin nightly  **  High Dose Corrective Algorithm**   Glucose: Dose:   70-139 ?? ?? ?? ?? ?? ??No Insulin   140-199 ?? ?? ?? ?? ?? 2 Units   200-249 3 Units   250-299 5 Units   300-349 6 Units   350-400 7 Units   Over 400 9 Units              Isavuconazonium Sulfate 186 MG CAPS  Take 372 mg by mouth daily Take 2 caps 3 times daily x 6 doses, then take daily             lidocaine-prilocaine (EMLA) 2.5-2.5 % cream  Apply topically as needed for Pain Apply topically as needed.             metoprolol tartrate (LOPRESSOR) 50 MG tablet  Take 1 tablet by mouth 2 times daily             ondansetron (ZOFRAN) 4 MG tablet  Take 1 tablet by mouth every 6 hours as needed for Nausea or Vomiting             oxyCODONE-acetaminophen (PERCOCET) 7.5-325 MG per tablet  Take 1 tablet by mouth every 4 hours as needed for Pain..             pantoprazole (PROTONIX) 40 MG tablet  Take 1 tablet by mouth every morning (before breakfast)             phosphorus (K PHOS NEUTRAL) 155-852-130 MG tablet  Take 2 tablets by mouth 2 times daily             predniSONE (DELTASONE) 10 MG tablet  Take 4 tablets by mouth daily for 3 days, THEN 2 tablets daily for 5 days, THEN 1 tablet daily for 5 days, THEN 0.5 tablets daily for 5 days.             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 ??His PMH is also significant for ??HTN, HLD, anxiety/depression, COPD & peripheral neuropathy. He is most recently been treated with DCEP chemotherapy MM (last tx 01/13/18).??He is followed closely by Dr. Dorann Lodge for COPD and recently been on a steroid taper for COPD exacerbation.   ??  Hospital Course:  ??  He presented (01/22/18) to the ED w/ chest pain and shortness of breath despite starting Prednisone early that day and using his home inhalers.  There were concerns for MI d/t EKG changes and elevated troponin and the cath lab was activated.  He underwent echocardiogram (01/23/18) that showed a LVEF 50-55% w/ normal diastolic fxn & no obvious regional wall motion abnormalities and a possible small/organized pericardial effusion.  He also underwent CTA chest (01/22/18) that showed no  PE, patchy groundglass opacities and tree-in-bud changes throughout the lungs which were new compared to prior examination.  He was evaluated by Dr. Bunnie Philips who believed his chest pain was likely from acute pericarditis and not an MI, so he did not go to the cath lab for angiography.  Pulmonary was consulted and believed he had COPD exacerbation as well contributing to shortness of breath.  He was started on Solumedrol 80 mg TID for management of COPD and pericarditis.  He also started Celebrex since other NSAIDs were contraindicated w/ thrombocytopenia.  He also developed atrial fibrillation on admit and was started on Diltiazem drip and then Amiodarone drip.  When he was admitted he was neutropenic, so he was pan-cultured and empirically started on Merrem for possible bacterial pneumonia and Eraxis for possible fungal pneumonia.  His Fungitell and galactomannan (01/25/18) were both positive, but blood cultures  were negative.       His chest pain and shortness of breath slowly improved.  His atrial fibrillation is controlled on oral Diltiazem, Amiodarone and Metoprolol.  He is now in NSR.  His dyspnea and abnormal lung exam w/ rhonchi and wheezing persisted despite steroids and antibiotics, so he underwent bronchoscopy w/ BAL (02/03/18).  This identified parainfluenza virus, Acinetobacter baumanni & Adenovirus.  He completed 16 days of Merrem (02/08/18) for possible bacterial pneumonia from [place]. ID was consulted for recommendations and they believe he has completed a full course of Merrem and does not need to continue on discharge.  They do recommend continuing antifungal treatment for possible fungal pneumonia, so he will be discharged on Cresemba after completing 15 days of Eraxis as inpatient.        He is no longer febrile or hypoxemic and his breathing has improved.   He will complete a steroid taper for COPD exacerbation.  He continues to get short of breath w/ activity and ARU was consulted for possible stay in acute rehab, but his strength has improved.  PT has been seeing the patient since he experienced a fall on 02/01/18 and he has continued to get stronger.  He is now safe for discharged home and will not require acute rehab.  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      He will be discharged home today. He met w/ the diabetes educator prior to discharge to teach him how to use accucheck and administer insulin.  He will follow up on Thursday (02/10/18) for provider visit and labs        Physical Exam:     Vital Signs:  BP (!) 96/58    Pulse 69    Temp 98.2 ??F (36.8 ??C) (Oral)    Resp 14    Ht '5\' 9"'$  (1.753 m)    Wt 193 lb 12.6 oz (87.9 kg)    SpO2 93%    BMI 28.62 kg/m??     Weight:    Wt Readings from Last 3 Encounters:   02/08/18 193 lb 12.6 oz (87.9 kg)   01/18/18 194 lb 9.6 oz (88.3 kg)   01/10/18 194 lb 12.8 oz (88.4 kg)       General: Awake, alert  and oriented.  HEENT:??normocephalic, alopecia,??PERRL, no scleral erythema or icterus, Oral mucosa moist and intact  LYMPH: ??Left axillary lymphadenopathy??  NECK: supple without palpable adenopathy  BACK: Straight negative CVAT  SKIN:??warm dry and intact without lesions rashes or masses  CHEST:??rhonchi w/ insp/exp??wheeze,??without use of accessory muscles  CV:  Normal S1 S2, irregular, no MRG  ABD:??NT ND normoactive BS, no palpable masses or hepatosplenomegaly  EXTREMITIES:??without edema, denies calf tenderness  NEURO: CN II - XII grossly intact  CATHETER:??Right IJ??PAC??(06/21/17, [place])??- CDI        Discharge Laboratory Data:  CBC:   Recent Labs     02/06/18  0625 02/07/18  0630 02/08/18  0410   WBC 4.4 5.5 4.9   HGB 7.9* 7.5* 7.7*   HCT 23.9* 22.7* 22.8*   MCV 95.3 93.8 95.0   PLT 41* 36* 29*     BMP/Mag:  Recent Labs     02/06/18  0625 02/07/18  0630 02/08/18  0410   NA 138 135* 136   K 5.0 4.7 4.6   CL 99 97* 98*   CO2 '29 30 30   '$ PHOS 2.7 3.4 3.3   BUN 18 23* 27*   CREATININE 0.5* <0.5* 0.6*   MG 1.90 1.80 1.80     LIVP:   Recent Labs     02/07/18  0630   AST 21   ALT 46*   BILIDIR <0.2   BILITOT 0.3   ALKPHOS 102     Coags:   Recent Labs     02/07/18  0630   PROTIME 11.6   INR 1.02   APTT 24.3*     Uric Acid   Recent Labs     02/07/18  0630   LABURIC 1.8*     Diagnostics:  1. ??PET scan (01/10/18):    ??  ??  2.  CTA Chest (525/19):  1. ??No pulmonary embolism.  2. ??Patchy groundglass opacities and tree-in-bud are seen throughout the   lungs which are new compared to prior examination. ??This most likely   represents atypical infection/inflammation. ??Clinical correlation is   recommended.  3. ??Paraseptal and centrilobular emphysematous change of the lungs.  4. ??Enlarged left axillary lymph nodes are not significantly changed   compared to PET/CT on 01/10/2018. ??This is worrisome for nodal metastasis.  5. ??Scattered lucent lesions are seen throughout the ribs, spine, and   scapula. ??Findings may represent multiple  myeloma or osseous metastasis.  ??  3.  Echocardiogram (01/23/18):  ??Technically difficult examination.  ??Normal left ventricle size, wall thickness, and systolic function with an  ??estimated ejection fraction of 50-55%. No obvious regional wall motion  ??abnormalities are seen (inferior and inferoseptal walls do appear normal, as  ??clinically questioned). Diastolic filling parameters suggests normal  ??diastolic function.  ??Aortic valve appears sclerotic but opens adequately.  ??Possible small/organized pericardial effusion though fat pad cannot be ruled  ??out.  ??  4.  CT Chest (01/31/18):  Stable innumerable pulmonary nodules bilaterally favoring metastatic disease.    Minimal decrease in size of dominant left axillary lymph node Jan 27, 2018    Stable osteolytic lesions of the axial skeleton, likely metastatic.  ??  5.  Echocardiogram (02/06/18):  ??Normal left ventricle size, wall thickness, and systolic function with an  ??estimated ejection fraction of 50-55%. No obvious regional wall motion  ??abnormalities are seen (inferior and inferoseptal walls do appear normal, as  ??clinically questioned). Diastolic filling parameters suggests normal  ??diastolic function.  ??Aortic valve appears sclerotic but opens adequately.  ??Possible small/organized pericardial effusion though fat pad cannot be ruled  ??out.  ??  Myeloma Labs (01/03/18):  SPEP:????reveals that M2, previously characterized as monoclonal IgG kappa in  mid-to-slow gamma is 1.0 gm/dL, greater than the 0.2 gm/dL observed on 08 Nov 2017.??  SIFE:????Serum immunofixation electrophoresis reveals persistent M2 in mid-to-slow gamma, a monoclonal IgG kappa. ??A recent M-spike, M1, was minor monoclonal IgG lambda in mid-gamma; M1 continues to be absent.  SFLC:  Kappa:  30.80, previously (11/08/17) -??10.40  Lambda:  1.46, previously (11/08/17) -??5.97  Free Kappa Lambda Ratio:  21.10, previously (11/08/17) -??1.74  Quantitative Immunoglobulins:  IgG:??  1480, previously (11/08/17) -  552  IgA:??  <26, previously (11/08/17) -??30  IgM:??  <20  ??  Myeloma Labs (01/27/18):  Serum Light Chains: Pending  ??  SPE/IFE:??M-protein persists in the gamma region on serum protein electrophoresis. The amount of this protein is 0.5 gm/dL   ??  Quantitative Immunoglobulins:  IgG:??  791  IgA:??  17  IgM:??  11  ??  PROBLEM LIST: ??????????   ????  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 ??COPD   9.????Influenza A??(10/12/17)  10.  COPD Exacerbation (12/2017)  11.  Pericarditis (12/2017)  12.  A. Fib w/ RVR (12/2017)  ????  TREATMENT: ??????????   ??  1. Rad Tx to T5-7, T10-L3, Right Scapula - 3000 cGy - Dr. Jamas Lav 03/20/16-04/01/16  2. RVD x1 03/20/16 - discontinued d/t rash   3. Velcade/Pomalyst/Dex x3 cycles 04/23/16-06/17/16 - discontinued d/t reaction to pomalyst  4. Velcade/Dex x 1 cycle 06/26/16 (last dose of dexamethasone 07/22/16  5. High-dose melphalan followed by administration of PBSCs 2.36 x10^6 cd34cells/kg on 08/28/16  6. Maintenance Revlimid '10mg'$  daily (12/2016-04/23/17)  7. Dexamethasone '40mg'$  daily (04/24/17)  8. DCEP   Cycle #1 - 04/26/17  Cycle #2 - 05/25/17 - excellent response  9.??Dara/Velcade/Dex (C1D1 - 06/22/17) - BMBx ??10/18/17 - CR  Cycles 7-8 (21 day cycle)  - Dex 20 mg po D4,5.8,9,11 and 12  - Dara 16 mg/Kg D1 only with Dex 20 mg IV  - Velcade 1.3 mg/M2 D1,4,8 and 11  Cycle 9 and beyond (maintenance, 11/08/17??- 01/03/18)  Dara 16 mg/Kg Q 28 days  Dex 12 mg IV D1  Velcade Q 2 weeks??  10. DCEP   Cycle #1 - 01/13/18  ??  ASSESSMENT AND PLAN:??????????   ??  1. ??IgG kappa multiple myeloma / Plasma Cell Leukemia:??Relapsed disease??  - S/p??treatment??Dara/Velcade/Dex??x 8 cycles??(06/22/17 - 10/2017). Followed by??maintenance Dara??Q4 wks/Velcade??Q2ks/Dex??(started 11/08/17)  - Now w/ progressive disease based on myeloma labs (01/03/18)  &??PET scan (01/10/18), see above??  ??  PLAN:??DCEP for disease and pain control. Hope for allogeneic transplant in CR2, but needs to disease response and improvement in lung function??  ??  - M-spike (01/27/18) - 0.5 g/dL, previously 1.0 g/dL  ??  DCEP Cycle #1, Day + 28 - Change to outpatient therapy when he gets stronger. Probably later this week.  ??  2. ??ID: Afebrile, cont treatment for possible fungal PNA  - Fungitell and galactomannan (01/25/18) - Positive   - Bronch / Diatherix (02/03/18) - parainfluenza virus, Acinetobacter baumanni, Adenovirus  - Consulted ID: No need to cont abx for Acinetobacter PNA, but cont antifungal for tree in bud on CT chest  - Cont Valtrex ppx; use Levaquin ppx if needed   - S/p Meropenem Day x 19 days for possible Acinetobacter PNA  - Eraxis Day + 16, change to Cresemba on d/c for fungal PNA     3. Heme:??Chemotherapy induced pancytopenia   - Transfuse for Hgb < 7 and Platelets <??20K  - No transfusion today   ??  4.??Metabolic: Stable renal fxn and e-lytes except for steroid-induced hyperglycemia, hypoNa   - Cont KPhos 500 mg bid (started 02/04/18)  - Keep Mg > 2 and K+ > 4.0   ??  5. Pulmonary:??Acute respiratory distress with mild hypercapnia and hypoxemia from COPD exacerbation and underlying PNA has resolved.  Cont treatment for possible fungal PNA    - Pulm Nodule: Stable??on??CT chest since 04/23/17; cont to monitor.   - CTPA (01/22/18):  No PE, patchy groundglass opacities and tree-in-bud are seen throughout the  lungs which are new compared to prior examination.   - CT chest (01/31/18): Stable innumerable pulmonary nodules bilaterally favoring metastatic disease.  - PFT (12/27/17):????compared to??07/2016:??FVC 3.25 L, 66%, and FEV1  1.42 L, 38%. FEV1/FVC is 44%. This is unchanged from prior study. Air trapping seen on lung volumes. Diffusion capacity 3.91, 79% of predicted, down from 5.21??  - Cont regular f/u w/ Dr. Dorann Lodge on d/c in 2 - 3 weeks   - Cont home inhalers    - Cont Steroid taper:   Solumedrol 80 mg TID (started 01/22/18), 40 mg TID (01/25/18), 40 mg bid (01/26/18), decrease/change Pred 25 mg bid (02/03/18), 40 mg daily x 5 days, 20 mg daily x 5 days, 10 mg daily x 5 days, 5 mg daily x 5 days, then stop  ??  6. GI/Nutrition: Appetite and oral intake is fair  - Cont PPI ppx w/ steroids??  - Cont low microbial diet ??  Constipation: ??Improved  - Cont Dulcolax prn &??Senokot-s 2 tabs daily    ??  7. 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  - Echo (01/23/18):  LVEF 50-55# w/ normal diastolic fxn & no obvious regional wall motion abnormalities  - Cont Lipitor??  - Cont Diltiazem CD 240 mg daily, Amiodarone 200 mg daily & Lopressor 25 mg bid  - S/p Celebrex (01/26/18 - 02/08/18)    8.  Steroid Induced Hyperglycemia:  Ongoing  - Cont high regimen Lispro SSI, AC&HS  -  Diabetes educator provided teaching prior to d/c   ??  9. Psych: ??Ongoing concerns about disease progression and ability to get transplant   Anxiety/Depression: Ongoing  - Cont Zoloft??50 mg daily??  - Psych to follow as needed once outpt  Insomnia:??No complaints??currently??  ??  10. Peripheral Neuropathy:??Ongoing  - Cont Gabapentin 300 mg??TID  ??  11. Bone Health:??No acute fx   -??H/o spinal cord compression & diffuse lytic lesions t/o skeleton  - CT Chest (04/23/17) - multiple lytic lesions throughout the skeleton   - Cont Ca/Vit D &??Zometa monthly (given??01/03/18) - next dose when discharged  ??  12.??Acute on??Chronic left lower back pain d/t Neoplasm:????Improved w/ chemotherapy  - S/p Radiation (09/01/17 - 09/15/17,??Fried)  - Cont Fentanyl patch??50 mcg/hr (increased 01/13/18, Rx 01/18/18)  - Cont Percocet 7.5/325 mg q4hrs prn  ??  13.  Steroid induced myopathy:  Ongoing   - Fall (02/01/18)  - Cont PT/OT @ home   - Cont steroid taper  ??    Condition on discharge:  Stable    Discharge Instructions:  [facility] to [facility] on Thursday for labs (CBC w/ diff, CMP, Mag & Phos) and provider visit.    The patient was advised on activity and dietary  restrictions.    The patient was advised to follow up in the emergency department or contact the physician with any unresolved nausea/vomiting/diarrhea/pain or temperature greater than 100.5 F or any other unusual symptoms.  This discharge summary and plan was discussed and agreed upon with Dr. Corky Sox.    Wayland Salinas, CNP

## 2018-02-08 NOTE — Plan of Care (Signed)
Problem: Falls - Risk of:  Goal: Will remain free from falls  Description  Will remain free from falls    Pt. Remains at risk for falls due to impaired gait and weakness. Fall prevention measures on-going: Fall sign posted on door, bed wheels locked and in lowest position, safe transfer using gait belt and front wheel walker, prompt attention to the use of the call light. Hourly rounding and frequent visual checks on-going. Will continue to monitor.   Problem: Pain:  Goal: Pain level will decrease  Description  Pain level will decrease  02/08/2018 0307 by Erma Pinto, RN  Outcome: Ongoing  Pt. Complaint of pain, pain medication administered, see MAR. Pain is being managed with pharmacological and non-pharmacological intervention. Pain level will be decreased or be at a satisfactory level by discharge.

## 2018-02-08 NOTE — Care Coordination-Inpatient (Addendum)
02/08/2018  [facility]  Clinical Case Management Department    Discharge Summary    Patient: David Watkins  MRN: [id number] / DOB: 18-Feb-1956    Emergency Contacts  Healthcare Agent Appointed: Adult siblings  Healthcare Agent's Name: Suanne Marker    Admission Documentation  Attending Provider: Harlene Salts, MD  Admit date/time: 01/21/2018  9:47 PM  Status: Inpatient [101]  Diagnosis: Pneumonia     Readmission within last 30 days:  no     Living Situation:  Discharge Planning  Living Arrangements: Spouse/Significant Other  Support Systems: Spouse/Significant Other  Potential Assistance Needed: N/A  Type of Home Care Services: None  Patient expects to be discharged to:: home  Expected Discharge Date: 01/25/18    Written Discharge Summary:  SW met with patient at bedside. Patient to discharge home today with significant other. Patient received rolling walker from [facility] [phone]) Patient also to receive home health care from [facility] [phone]). Significant Other providing transport at discharge. IMM completed. Melissa Feltner with [employer] aware of discharge plan and will pull order from [employer].     Service Assessment :       Values / Beliefs  Do you have any ethnic, cultural, sacramental, or spiritual religious needs you would like [place] to be aware of while you are in the hospital?: Yes  Spiritual Requests During Hospitalization: [place] (For Healthcare)  Pre-existing DNR Comfort Care/DNR Arrest/DNI Order: No  Healthcare Directive: Yes, patient has an advance directive for healthcare treatment  Type of Healthcare Directive: Durable power of attorney for health care, Living will  Copy in Chart: Yes, copy in chart  Chart Copy Status :: Active, Current  Healthcare Agent Appointed: Adult siblings  Healthcare Agent's Name: Hugh Kamara  If you are unable to speak for yourself, does your Healthcare Agent or Legal Spokesperson know your healthcare wishes?: Yes      Pharmacy: No  concerns reported    Potential Assistance Purchasing Medications:  No  Does patient want to participate in local refill/meds to beds program?: No    Notification for [place] placement completed in HENS/PAS?: No     Discharge disposition: Home with Home Health       Ancillary: N/A     Joden and his family were provided with choice of provider; he and his family are in agreement with the discharge plan.      Care Transitions patient: No    Katherina Right [place] Beck, MSW  [facility]  Case Management Department  Ph: [phone]

## 2018-02-08 NOTE — Progress Notes (Signed)
Critical platelet lab of Results for ROLAN, WRIGHTSMAN (MRN [id number]) as of 02/08/2018 07:28   Ref. Range 02/08/2018 04:10   Platelet Count Latest Ref Range: 135 - 450 K/uL 29 (LL)   No action required. Orders in place.

## 2018-02-08 NOTE — Progress Notes (Signed)
Port de-accessed with sterile technique. Discharge instructions reviewed with patient per onco.

## 2018-02-08 NOTE — Discharge Instructions (Signed)
COPD ACTION PLAN:     Call Pulmonologist office with any warning signs and symptoms such as:  -More breathless the usual  -Thicker mucus or change in mucus color  -Swelling of ankles more than usual OR weight gain of 3 lb in one day or 5 lb in a week  -More coughing than usual  -Feel like you have a "chest cold"  -Poor sleeping or poor appetite  -Medications are not helping with signs and symptoms     Please continue to take your medications as prescribed, avoid inhaled irritants such as smoking tobacco products and keep all appointments with your doctor.                  [employer] Discharge Instructions    Call for Questions/Concerns:  [phone] [facility] office  The phone number listed above is available 24 hrs/7 days per week  [facility] is open M-F 8am-4:30pm; Sat-Sun/Holidays 8am-1pm    Symptoms to Report Immediately:     Fever of 100.5 or greater   Vomiting without relief after use of anti-nausea medication   Severe abdominal cramping   Diarrhea: More than 3 loose, watery bowel movements in a 24 hour period   Unusual or excessive bleeding from your mouth, nose, rectum, bladder    Sudden onset of shortness of breath or chest pain   Signs/symptoms of infection: redness, warmth, swelling-particularly to central line site    Report to Physician's office within 24 hours:     Pain not relieved by pain medication   Change in urination-odor, cloudiness, frequency, or pain with urination   Flu-like symptoms   Skin changes-rash, hives, redness or peeling of skin    Additional Instructions:     Avoid people with colds, flu-like symptoms, or any sign of infection   Drink plenty of fluids-attempt to consume 2-3 liters (60-100 ounces) of fluids/24 hour period   Continue low microbial diet until instructed by physician to resume normal diet   Bring all of your medications with you to your doctor's appointments   Bring your current medicine list to each hospital and office visit    [place]: ***    You are being discharged with IV access due to need for ongoing therapy.  Below is pertinent information regarding your IV that your next provider may need to know:  Type:  Right Single Lumen port                     Date of placement:  ***  Surgeon:     01/25/2018 10:51 AM  [name]            My Discharge Checklist    Here at the [facility], we want to make sure you have the help you will need once you leave the hospital.  We are going to go over your discharge instructions with you. We give these to you in writing so you will have a reference if you have questions about symptoms or problems to look for after you leave the hospital.     We know you want to feel better and get home soon. Please answer these questions so we can be sure you have what you need, your questions are answered, and you feel prepared for discharge.    Yes No Do you understand your diagnosis?  Yes No Do you know when and who you need to follow up with?  Yes No Do you feel ready to go home & take care of your daily needs?  Yes No Do you have the help you need at home?  Yes No Do you understand what medications your are taking?  Yes No Do you understand what your medications are for?  Yes No Do you understand what medication side effects to watch for?  Yes No Do you know what symptoms or health problems that require an immediate call to your physician?  Yes No Do you feel ready for discharge?  Yes No Are there any questions that you have re: how to care for yourself at home?  Yes No Do you know about MyChart?    If you have any questions after you get home, feel free to call the unit and ask to speak with your nurse.  In about 7-10 days you will receive a survey. We value your opinion and hope that you have received care that will enable you to choose the best scores when completing the survey.    It was our pleasure to take care of you,  [facility]           [phone]                                                     Outpatient Infusion [phone]    [facility] Physician Office Mariano Colon or Procedural Scheduling [phone] ([phone])

## 2018-02-08 NOTE — Progress Notes (Signed)
Met with pt. Verbally reviewed with pt when to take his Humalog sliding scale/correction insulin( when needed). Pt verbalized understanding. The patient given written information regarding sliding scale insulin.and a sliding scale insulin chart.The patient able to verbalize how to read a sliding scale insulin chart. Verbally reviewed with pt. how to assemble an insulin pen and how to inject insulin. Pt & significant other verbalized understanding. Pt's significant other has Type 1 diabetes and is familiar with using an insulin pen.Pt given an  Insulin starting booklet. Pt encouraged to call with questions or concerns.

## 2018-02-08 NOTE — Care Coordination-Inpatient (Addendum)
Type of Admission  Multiple Myeloma/ Plasma Cell Leukemia  C1 D28 DCEP  Admitted with SOB        Central venous catheter  Right Single Lumen Port ( 01/12/18)        Plan          Update  01/25/18:n  Admitted through the Emergency Room with acute shortness of breath. Now being treated with acute pericarditis.  States he is feeling better.  Out of bed to chair, audible wheezing.  Currently of a Amiofdarone infusion.  01/26/18: Walked to bathroom & became very dyspneic & congested.  Assisted back to bed with the assist of 2 nurses.  Medicated with IV Morphine.  01/31/18:  Positive culture for galactomanan & aspergillus.  Currently on room air.  For  Possible bronchoscopy mid-week.  02/02/18:  Currently on room air for bronchoscopy tomorrow. Continue prednisone taper.  02/04/18:  Status post bronchoscopy form yesterday.  Currently on room air.  Aware that next course of chemotherapy may need to start soon.  01/29/18:  Home today with significant other , David Watkins in care giver role.  Diabetic Educator  has been twice to  regarding insulin administration & glucometer testing.        Education  02/01/18:  Discussed Cresemba as fungal coverage, assisted David Watkins with for for Cresemba Support  02/08/18:Reviewed discharge instructions with patient and significant other , David Watkins..  Reviewed discharge medications including dosing, schedule, indication, and adverse reactions.  Reviewed which medications were already taken today and next dosage due for each medication.  David Watkins is aware that David Watkins will be ready for pick tomorrow at [place] in [place].    Insulin have ordered, glucometer, test strips, insulin needles for Humalog pen. I have instructed David Watkins to brin  A blood sugar log to [facility] appointments.    Reviewed signs and symptoms that prompt a call to the physician and appropriate phone numbers. [facility] card given to the patient with explanations of its use, has from previous admits.  Reviewed follow up appointments that have been made  in [facility] and Outpatient Oncology.                       Patient verbalized understanding of all instructions and questions were answered to his. satisfaction.  Signed discharge instructions were given to the patient and a copy placed in the paper-lite chart.  Patient discharged to home per wheelchair with [facility].      David Watkins      Discharge  To be determined        [place]  01/31/18:  Cresemba Form Completed--> Re-sent to [place] 6/5--02/08/18:  Cresemba approved will be available at local [facility] by tomorrow

## 2018-02-08 NOTE — Progress Notes (Signed)
S: No change to SOB or cough. No chest pain or edema.     Tele: Sinus, PAC    O:  Physical Exam:  BP (!) 96/58    Pulse 69    Temp 98.2 ??F (36.8 ??C) (Oral)    Resp 14    Ht 5' 9"  (1.753 m)    Wt 193 lb 12.6 oz (87.9 kg)    SpO2 93%    BMI 28.62 kg/m??    General (appearance):  No acute distress  Eyes: anicteric   Neck: soft, No JVD  Ears/Nose/Mouth/Thorat: No cyanosis  CV: RRR   Respiratory:  + wheeze and rhonchi   GI: soft, non-tender, non-distended  Skin: Warm, dry. No rashes  Neuro/Psych: Alert and oriented x 3. Appropriate behavior  Ext:  No c/c. Tr BLE edema  Pulses:  2+ radial     I.O's= +2.9 liters    Weight  Admission: Weight: 194 lb 10.7 oz (88.3 kg)   Today: Weight: 193 lb 12.6 oz (87.9 kg)    CBC:   Recent Labs     02/06/18  0625 02/07/18  0630 02/08/18  0410   WBC 4.4 5.5 4.9   HGB 7.9* 7.5* 7.7*   HCT 23.9* 22.7* 22.8*   MCV 95.3 93.8 95.0   PLT 41* 36* 29*     BMP:   Recent Labs     02/06/18  0625 02/07/18  0630 02/08/18  0410   NA 138 135* 136   K 5.0 4.7 4.6   CL 99 97* 98*   CO2 29 30 30    PHOS 2.7 3.4 3.3   BUN 18 23* 27*   CREATININE 0.5* <0.5* 0.6*     Mag:   Lab Results   Component Value Date    MG 1.80 02/08/2018     LIVER PROFILE:   Recent Labs     02/07/18  0630   AST 21   ALT 46*   BILIDIR <0.2   BILITOT 0.3   ALKPHOS 102     PT/INR:   Recent Labs     02/07/18  0630   PROTIME 11.6   INR 1.02     APTT:   Recent Labs     02/07/18  0630   APTT 24.3*     Pro-BNP:   Lab Results   Component Value Date    PROBNP 324 01/21/2018       Imaging:    02/01/2018 CT chest:     Stable innumerable pulmonary nodules bilaterally favoring metastatic disease.   ??   Minimal decrease in size of dominant left axillary lymph node Jan 27, 2018   ??   Stable osteolytic lesions of the axial skeleton, likely metastatic.     01/31/2018 ECG: NSR    01/27/2018 CT chest:  1. Interval development of multiple pulmonary nodules seen scattered throughout both lungs. There is also some nodular areas of patchy consolidation within the  left lung and some interstitial infiltrate within the right upper lobe periphery. Findings are   ??nonspecific but favor an inflammatory etiology such as an atypical pneumonia or bronchiolitis. Interval development of pulmonary metastatic disease is considered less likely.   2. Moderate emphysema.   3. Left axillary lymphadenopathy is mildly decreased in comparison the prior study.   4. Previously seen abnormal paraspinal soft tissue identified along the lower thoracic spine has decreased in size and only some small ill-defined nodular soft tissue remains. Findings are consistent with neoplastic disease which is  responding to    therapy.   5. Stable osseous metastatic disease.       01/21/2018 CTA PE     1. ??No pulmonary embolism.   2. ??Patchy groundglass opacities and tree-in-bud are seen throughout the    lungs which are new compared to prior examination. ??This most likely    represents atypical infection/inflammation. ??Clinical correlation is    recommended.   3. ??Paraseptal and centrilobular emphysematous change of the lungs.   4. ??Enlarged left axillary lymph nodes are not significantly changed    compared to PET/CT on 01/10/2018. ??This is worrisome for nodal metastasis.   5. ??Scattered lucent lesions are seen throughout the ribs, spine, and    scapula. ??Findings may represent multiple myeloma or osseous metastasis.       01/21/2018 TTE: EF 50-55%. No def RWMA (inferior and inferoseptum was normal). No sig valve regurgitation or stenosis.  ??  01/22/18 ECGs reviewed. Earlier ecg seemed to demonstrate acute inferior MI but Tns negative and further ecgs showed evolution other ST segments suggesting acute pericarditis.  ??  01/21/2018 CTA Chest: No PE. Tree-in-bud bilateral opacities suggesting atypical infection/inflammation. Enlarged L axillary notes unchanged. Ribs, spine, and scapula lesions suggesting MM/bony mets. No sig pericardial effusion noted.  ??  01/22/18 CXR: Hyperinflated lungs. No definite acute  consolidation    Assessment:    62 y.o. admitted with severe SOB.  -Acute pericarditis  -Multiple myeloma  -COPD  -Neutropenia  -Pneumonia with "tree in bud" appearance of CT chest  -Hyperlipidemia  -parox A fib with RVR      Plan:  -Keep K>4, Mg>2.   -Amio  -Lipitor  -Diltiazem, metoprolol

## 2018-02-08 NOTE — Progress Notes (Addendum)
ID Follow-up NOTE  Medical Student note - reviewed and modified, see Attending addendum at bottom    CC:   Pneumonia, Parainfluenza virus   Antibiotics: Andidulafungin, meropenem    Admit Date: 01/21/2018  Hospital Day: 19    Subjective:     Patient feels well, the same as yesterday. He received a breathing treatment this morning. Cough is the same as yesterday.   Denies fever, chills, worsening SOB, chest pain, abd pain, n/v, diarrhea.   ??  Afebrile, WBC 4.9    Objective:     Patient Vitals for the past 8 hrs:   BP Temp Temp src Pulse Resp SpO2   02/08/18 0411 (!) 104/57 97.8 ??F (36.6 ??C) Oral 67 16 94 %   02/08/18 0100 (!) 102/57 98 ??F (36.7 ??C) Oral 72 -- --   02/08/18 0045 (!) 89/53 98 ??F (36.7 ??C) Oral 70 16 95 %     I/O last 3 completed shifts:  In: 1200 [P.O.:1200]  Out: -   No intake/output data recorded.    EXAM:  GENERAL:      No apparent distress, on room air     HEENT:           Membranes moist, no oral lesion  NECK:             Supple, no JVD   LUNGS:           Bilateral faint wheezes and rhonchi bilaterally, no rales, no dullness  CARDIAC:       RRR, no murmur appreciated  ABD:                + BS, soft, non-distend, non-tender   EXT:                No rash, trace 1+ edema, no lesions  NEURO:          No focal neurologic findings  PSYCH:           Orientation, sensorium, mood normal  LINES:             Peripheral iv    Data Review:  Lab Results   Component Value Date    WBC 4.9 02/08/2018    HGB 7.7 (L) 02/08/2018    HCT 22.8 (L) 02/08/2018    MCV 95.0 02/08/2018    PLT 29 (LL) 02/08/2018     Lab Results   Component Value Date    CREATININE 0.6 (L) 02/08/2018    BUN 27 (H) 02/08/2018    NA 136 02/08/2018    K 4.6 02/08/2018    CL 98 (L) 02/08/2018    CO2 30 02/08/2018     Hepatic Function Panel:   Lab Results   Component Value Date    ALKPHOS 102 02/07/2018    ALT 46 02/07/2018    AST 21 02/07/2018    PROT 5.3 02/07/2018    PROT 6.6 04/24/2016    BILITOT 0.3 02/07/2018    BILIDIR <0.2 02/07/2018     IBILI see below 02/07/2018    LABALBU 2.9 02/07/2018     PATH:  6/06 Cytology BAL lingula   Lung, Lingula Bronchial Lavage: NEGATIVE  - ??No malignant cells identified; population of squamous cells with atypia which appears to reflect reactive metaplastic changes is noted.  - ??Bronchial cells, macrophages and mild acute inflammation.    MICRO:  6/06 Resp Infection panel - [employer]    ??  6/06 BAL Resp culture  CULTURE, RESPIRATORY No growth 36-48 hours ??   ?? Gram Stain Result No WBCs or organisms seen   ??  6/06 BAL Fungal culture: No Fungal elements seen  6/06 BAL AFB smear: No AFB observed by Fluorescent stain  6/06 Aspergillus Galact Ag: Negative   6/06 Legionella culture pending   6/06 Viral resp culture pending   ??  5/28 Fungitell (1,3)-Beta-D-Glucan: Positive   5/28 Aspergillus Galacto Ag: Positive   ??  5/25 BC 1&2: No growth after 5 days of incubation  5/25 Fungus culture, blood: No growth after 2 weeks of incubation  5/25 Throat fungal culture, yeast: No growth after 2 week/s of incubation, No Fungal elements seen  5/25 Throat culture, HSV: Negative     IMAGING:  6/04 CT Chest   Impression   ??   Stable innumerable pulmonary nodules bilaterally favoring metastatic disease.   ??   Minimal decrease in size of dominant left axillary lymph node Jan 27, 2018   ??   Stable osteolytic lesions of the axial skeleton, likely metastatic   ??  5/30 CT Chest  Impression   1. Interval development of multiple pulmonary nodules seen scattered throughout both lungs.   There is also some nodular areas of patchy consolidation within the left lung and some interstitial infiltrate within the right upper lobe periphery.    Findings are nonspecific but favor an inflammatory etiology such as an atypical pneumonia or bronchiolitis.   Interval development of pulmonary metastatic disease is considered less likely.   2. Moderate emphysema.   3. Left axillary lymphadenopathy is mildly decreased in comparison the prior study.   4.  Previously seen abnormal paraspinal soft tissue identified along the lower thoracic spine has decreased in size and only some small ill-defined nodular soft tissue remains.   Findings are consistent with neoplastic disease which is responding to therapy.   5. Stable osseous metastatic disease.   ??  5/25 CXR   Impression   ??   No acute pulmonary pathology or change from the prior study   ??  5/25 CTPA  Impression   1. ??No pulmonary embolism.   2. ??Patchy groundglass opacities and tree-in-bud are seen throughout the lungs which are new compared to prior examination.   This most likely represents atypical infection/inflammation.    Clinical correlation is recommended.   3. ??Paraseptal and centrilobular emphysematous change of the lungs.   4. ??Enlarged left axillary lymph nodes are not significantly changed compared to PET/CT on 01/10/2018. ??   This is worrisome for nodal metastasis.   5. ??Scattered lucent lesions are seen throughout the ribs, spine, and scapula.    Findings may represent multiple myeloma or osseous metastasis.   ??  5/24 CXR   Impression   ??   No acute cardiopulmonary findings.   ??  Scheduled Meds:  ??? levalbuterol  1.25 mg Nebulization TID   ??? predniSONE  40 mg Oral Daily   ??? sodium chloride (Inhalant)  15 mL Nebulization BID   ??? phosphorus  500 mg Oral BID   ??? metoprolol tartrate  50 mg Oral BID   ??? amiodarone  200 mg Oral Daily   ??? guaiFENesin  600 mg Oral BID   ??? diltiazem  240 mg Oral Daily   ??? celecoxib  100 mg Oral BID   ??? insulin lispro  0-18 Units Subcutaneous TID WC   ??? insulin lispro  0-9 Units Subcutaneous Nightly   ??? anidulafungin  100 mg Intravenous Q24H   ???  atorvastatin  10 mg Oral Nightly   ??? calcium elemental  500 mg Oral BID   ??? fentaNYL  1 patch Transdermal Q72H   ??? gabapentin  300 mg Oral TID   ??? pantoprazole  40 mg Oral QAM AC   ??? sennosides-docusate sodium  2 tablet Oral Daily   ??? sertraline  50 mg Oral Daily   ??? valACYclovir  500 mg Oral BID   ??? meropenem  1 g Intravenous Q8H   ???  sodium chloride flush  10 mL Intravenous 2 times per day   ??? Saline Mouthwash  15 mL Swish & Spit 4x Daily AC & HS   ??? tiotropium  18 mcg Inhalation Daily   ??? budesonide  0.25 mg Nebulization BID    And   ??? formoterol  20 mcg Nebulization BID     Continuous Infusions:  ??? dextrose     ??? sodium chloride 20 mL/hr at 02/08/18 0425     PRN Meds:  acetaminophen, diphenhydrAMINE, potassium phosphate IVPB, glucose, dextrose, glucagon (rDNA), dextrose, potassium chloride, magnesium sulfate, LORazepam, albuterol, bisacodyl, ondansetron, oxyCODONE-acetaminophen, prochlorperazine, sodium chloride flush, potassium chloride, magnesium sulfate, magnesium hydroxide, Saline Mouthwash, alteplase, LORazepam    Assessment:     David Watkins is a 62 y.o. M with PMH of Multiple Myeloma s/p DCEP chemo with recent PET 5/13 showing progression of disease, COPD, HLD who presented to [facility] on 5/24 with chest pain and SOB of a few days duration.  ??  - Multiple myeloma with osseous lesions and progression   - leukopenia (resolved)   - resp panel + for Parainfluenza virus types 1,2,3,4, Enterovirus group, and Acinetobacter baumannii  - New radiographic patchy groundglass opacities and tree-in-bud nodules in the lungs   - negative cytology of BAL     Plan:     - cont anidulafungin  - f/u cultures, sensitivities  - supportive tx for Parainfluenza, Enterovirus infection    Discussed with pt, SO   Hina Virk, MS IV    Addendum to Medical Student Progress??note:  Pt seen,examined and evaluated. I have independently performed history, physical exam, lab and data review. ??I have determined assessment and plan as documented by student??(H Virk).      62 yo man with hx multiple myeloma, COPD.  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  Pt reports resp sx since Feb.  + SOB, cough - non-productive or productive "mucous"  Dx influenza in March / April.  Admit 5/15 with COPD exacerbation.    Admit 5/24 with SOB.  CP and fatigue.  Afebrile,  WBC 0.2.     Started on Meropenem.    ??  Serum + 1,3 BG, Galactomannan.  Bronch 6/6 - 'thick, tenacious secretions throughout he bilateral upper and lower lobe airways"  BAL cult neg.  BAL galactomannan negative.    Diatherix / multiple PCR + Parainfluenza, Enterovirus, Acetinetobacter  ??  Today, afebrile.  WBC 4.9.  On RA, "much better" respiratory status  ??  IMP/  Multiple myeloma  COPD  Pul nodules / groundglass -- maybe from viral pneumonia  + fungal serum markers - 1,3BG, galactomannan  Resp viruses  on BAL PCR.  Likely pathogen   Acinetobacter on BAL PCR, not isolated on culture, likely colonizing and not pathogen  ??  REC/  Cont anidulafungin - treat with this, micafungin or new azole after discharge  - voriconazole and amiodarone can lead to prolonged QTc, P450 3a4 metabolism may increase amio level  -  isavuconazole has less drug interactions than voriconazole    - echinocandins (anidula, micafungin) only iv   ??  Would d/c off meropenem at discharge, can use levofloxacin for prophylactic antibiotic  ??  Will review CT with radiologist  Will f/u on culture - fungal cult held x 4 weeks  ??  Discussed with pt, GF / SO  Ander Slade, MD

## 2018-02-08 NOTE — Consults (Addendum)
Nutrition Assessment    Type and Reason for Visit: Reassess    Nutrition Recommendations:    Continue Low Microbial Diet, monitor and record all po intake    Discontinue Ensure Enlive, per pt request    Monitor need for additional DM diet edu     Nutrition Assessment: Pt nutritionally improving w/ intakes of 76-100% of all meals and report of good appetite/diet tolerance. Pt started on prednisone w/in last month and w/ elevated POCG 151-280mg /dL. Reviewed Carb Controlled diet w/ pt and emphasized frequent meals and incorporating fiber and protein at each meal. Provided handout Planning Healthy Meals ([email or web address]). Will monitor nutritional adequacy and need for additional diet edu.     Malnutrition Assessment:   Malnutrition Status: No malnutrition   Context: Chronic illness   Findings of the 6 clinical characteristics of malnutrition (Minimum of 2 out of 6 clinical characteristics is required to make the diagnosis of moderate or severe Protein Calorie Malnutrition based on AND/ASPEN Guidelines):  1. Energy Intake-(Greater than 50% of estimated energy requirement), Greater than or equal to 7 days    2. Weight Loss-No significant weight loss,    3. Fat Loss-No significant subcutaneous fat loss,    4. Muscle Loss-No significant muscle mass loss,    5. Fluid Accumulation-No significant fluid accumulation,    6. Grip Strength-Not measured    Nutrition Risk Level: Low    Nutrient Needs:   Estimated Daily Total Kcal: [phone])   Estimated Daily Protein (g): 88-115(1-1.2)   Estimated Daily Total Fluid (ml/day): 7846-9629    Nutrition Diagnosis:    Problem: No nutrition diagnosis at this time    Objective Information:   Nutrition-Focused Physical Findings: trace edema BUE/BLE; x2BM ~24 hrs    Wound Type: None   Current Nutrition Therapies:   Oral Diet Orders: Low Microbial    Oral Diet intake: 76-100%   Oral Nutrition Supplement (ONS) Orders: Standard High Calorie Oral Supplement(pt requested dc  )   ONS intake: 0%(per I/O)   Anthropometric Measures:   Ht: 5\' 9"  (175.3 cm)    Current Body Wt: 193 lb 12.6 oz (87.9 kg)   Admission Body Wt: 194 lb (88 kg)   Usual Body Wt: (194# "about usual" per patient)   % Weight Change:    Weight appears stable per epic   Ideal Body Wt: 160 lb 4.4 oz (72.7 kg)    BMI Classification: BMI 25.0 - 29.9 Overweight    Nutrition Interventions:   Continue current diet, Discontinue ONS  Continued Inpatient Monitoring, Education Initiated    Nutrition Evaluation:    Evaluation: Progressing toward goals    Goals: Pt will consume and tolerate at least 50% of meals/supplements offered     Monitoring: Meal Intake, Diet Tolerance, Patient/Family Education      Electronically signed by Karna Dupes, RD, LD on 02/08/18 at 10:01 AM    RASNICK-WISEMAN, RD, LD  Pager:  [phone]  Office:  [phone]

## 2018-02-08 NOTE — Other (Signed)
02/08/18 1049   Encounter Summary   Services provided to: Patient   Referral/Consult From: Rounding   Continue Visiting   (Seen 02/08/18, ALE )   Complexity of Encounter Moderate   Length of Encounter 15 minutes   Routine   Type Initial   Assessment Approachable   Intervention Nurtured hope   Outcome Expressed gratitude

## 2018-02-08 NOTE — Progress Notes (Signed)
Patient escorted to private vehicle via wheelchair without incident.

## 2018-02-08 NOTE — Plan of Care (Signed)
Nutrition Problem: No nutrition diagnosis at this time  Intervention: Food and/or Nutrient Delivery: Continue current diet, Discontinue ONS  Nutritional Goals: Pt will consume and tolerate at least 50% of meals/supplements offered

## 2018-02-08 NOTE — Care Coordination-Inpatient (Signed)
[facility]    Spoke with patient regarding [facility] services. Patient aware and agreeable to services. Faxed orders to [facility].    Annelisa Ryback, LPN  Care Transition Nurse  [facility]  [phone]

## 2018-02-08 NOTE — Discharge Instructions (Signed)
Continuity of Care Form    Patient Name: David Watkins   DOB:  April 27, 1956  MRN:  [id number]    Admit date:  01/21/2018  Discharge date:  ***    Code Status Order: Full Code   Advance Directives:   Advance Care Flowsheet Documentation     Date/Time Healthcare Directive Type of Healthcare Directive Copy in Chart Healthcare Agent Appointed Healthcare Agent's Name Healthcare Agent's Phone Number    02/03/18 1146  Yes, patient has an advance directive for healthcare treatment  --  Yes, copy in chart  --  --  --    01/22/18 0221  Yes, patient has an advance directive for healthcare treatment  Durable power of attorney for health care;Living will  Yes, copy in chart  Adult siblings  Truddie Crumble  --          Admitting Physician:  Emilee Hero, MD  PCP: Emilee Hero, MD    Discharging Nurse: [facility] Unit/Room#: 6327/6327-01  Discharging Unit Phone Number: ***    Emergency Contact:   Extended Emergency Contact Information  Primary Emergency Contact: Lady Gary [place]  Home Phone: [phone]  Relation: Domestic Partner  Secondary Emergency Contact: Mccleery,jiles  Home Phone: [phone]  Mobile Phone: [phone]  Relation: Brother/Sister    Past Surgical History:  Past Surgical History:   Procedure Laterality Date   . BONE MARROW BIOPSY     . BONE MARROW TRANSPLANT     . BRONCHOSCOPY N/A 02/03/2018    BRONCHOSCOPY WITH BAL, CHOICE ANESTHESIA performed by [name], MD at [facility] ENDOSCOPY   . BRONCHOSCOPY  02/03/2018    BRONCHOSCOPY THERAPUTIC ASPIRATION SUBSEQUENT performed by [name], MD at [facility] ENDOSCOPY   . OTHER SURGICAL HISTORY Left 08/17/2016    trifusion cath placement   . PRE-MALIGNANT / BENIGN SKIN LESION EXCISION Left 03/29/2017    EXCISE LESION LEFT EXTERNAL EAR WITH FROZEN SECTION, FULL THICKNESS SKIN GRAFT       Immunization History:   Immunization History   Administered Date(s) Administered   . Hepatitis B, unspecified formulation 02/26/2017   . IPV (Ipol)  02/26/2017   . Influenza Virus Vaccine 06/01/2016   . Meningococcal MCV4P (Menactra) 02/26/2017   . Pneumococcal 13-valent Conjugate (Prevnar13) 02/26/2017   . Tdap (Boostrix, Adacel) 02/26/2017       Active Problems:  Patient Active Problem List   Diagnosis Code   . Multiple myeloma not having achieved remission (HCC) C90.00   . 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 Multiple myeloma in remission (HCC) C90.01   . Moderate malnutrition (HCC) E44.0   . Open wound of left ear S01.302A   . Ear lesion H93.90   . Skin lesion L98.9   . 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 COPD exacerbation (HCC) J44.1   . Pulmonary nodule R91.1   . Pain R52   . Pneumonia J18.9   . Acute respiratory failure with hypoxia and hypercapnia (HCC) J96.01, J96.02   . Neutropenia associated with infection (HCC) D70.3   . Paroxysmal atrial fibrillation (HCC) I48.0   . Mixed hyperlipidemia E78.2   . Acute pericarditis I30.9   . Atypical pneumonia J18.9       Isolation/Infection:   Isolation          No Isolation  Nurse Assessment:  Last Vital Signs: BP 108/70   Pulse 81   Temp 97.2 F (36.2 C) (Oral)   Resp 14   Ht 5\' 9"  (1.753 m)   Wt 193 lb 12.6 oz (87.9 kg)   SpO2 95%   BMI 28.62 kg/m     Last documented pain score (0-10 scale): Pain Level: 3  Last Weight:   Wt Readings from Last 1 Encounters:   02/08/18 193 lb 12.6 oz (87.9 kg)     Mental Status:  {IP PT MENTAL STATUS:20030}    IV Access:  {MH COC IV ACCESS:304088262}    Nursing Mobility/ADLs:  Walking   {CHP DME QIHK:742595638}  Transfer  {CHP DME VFIE:332951884}  Bathing  {CHP DME ZYSA:630160109}  Dressing  {CHP DME NATF:573220254}  Toileting  {CHP DME YHCW:237628315}  Feeding  {CHP DME VVOH:607371062}  Med Admin  {CHP DME IRSW:546270350}  Med Delivery   {MH COC MED Delivery:304088264}    Wound Care Documentation and Therapy:        Elimination:  Continence:    Bowel: {YES /  KX:38182}   Bladder: {YES / XH:37169}  Urinary Catheter: {Urinary Catheter:304088013}   Colostomy/Ileostomy/Ileal Conduit: {YES / CV:89381}       Date of Last BM: ***    Intake/Output Summary (Last 24 hours) at 02/08/2018 1440  Last data filed at 02/08/2018 0841  Gross per 24 hour   Intake 960 ml   Output --   Net 960 ml     I/O last 3 completed shifts:  In: 1200 [P.O.:1200]  Out: -     Safety Concerns:     {MH COC Safety Concerns:304088272}    Impairments/Disabilities:      {MH COC Impairments/Disabilities:304088273}    Nutrition Therapy:  Current Nutrition Therapy:   {MH COC Diet List:304088271}    Routes of Feeding: {CHP DME Other Feedings:304088042}  Liquids: {Slp liquid thickness:30034}  Daily Fluid Restriction: {CHP DME Yes amt example:304088041}  Last Modified Barium Swallow with Video (Video Swallowing Test): {Done Not Done OFBP:102585277}    Treatments at the Time of Hospital Discharge:   Respiratory Treatments: ***  Oxygen Therapy:  {Therapy; copd oxygen:17808}  Ventilator:    {MH CC Vent OEUM:353614431}    Rehab Therapies: {THERAPEUTIC INTERVENTION:[phone]}  Weight Bearing Status/Restrictions: {MH CC Weight Bearing:304508812}  Other Medical Equipment (for information only, NOT a DME order):  {EQUIPMENT:304520077}  Other Treatments: ***    Patient's personal belongings (please select all that are sent with patient):  {CHP DME Belongings:304088044}    RN SIGNATURE:  {Esignature:304088025}    CASE MANAGEMENT/SOCIAL WORK SECTION    Inpatient Status Date: ***    Readmission Risk Assessment Score:  Readmission Risk              Risk of Unplanned Readmission:        39           Discharging to Facility/ Agency    Name:    Address:   Phone:   Fax:    Dialysis Facility (if applicable)    Name:   Address:   Dialysis Schedule:   Phone:   Fax:    Case Manager/Social Worker signature: {Esignature:304088025}    PHYSICIAN SECTION    Prognosis: {[phone]}    Condition at Discharge: {MH Patient  Condition:304088024}    Rehab Potential (if transferring to Rehab): {[phone]}    Recommended Labs or Other Treatments After Discharge: ***    Physician Certification: I certify the above information and transfer of Lonnel  L Henao  is necessary for the continuing treatment of the diagnosis listed and that he requires {Admit to Appropriate Level of Care:20763} for {GREATER/LESS:304500278} 30 days.     Update Admission H&P: {CHP DME Changes in RJJOA:416606301}    PHYSICIAN SIGNATURE:  {Esignature:304088025}

## 2018-02-12 LAB — CULTURE, LEGIONELLA

## 2018-02-14 ENCOUNTER — Encounter

## 2018-02-14 LAB — TYPE AND SCREEN
ABO/Rh: O POS
Antibody Screen: POSITIVE

## 2018-02-14 LAB — ANTIBODY IDENTIFICATION: Antibody ID: POSITIVE

## 2018-02-14 LAB — DAT IGG: DAT IgG Capture: NEGATIVE

## 2018-02-14 MED ORDER — HEPARIN SOD (PORK) LOCK FLUSH 100 UNIT/ML IV SOLN
100 UNIT/ML | INTRAVENOUS | Status: DC | PRN
Start: 2018-02-14 — End: 2018-02-15
  Administered 2018-02-14: 18:00:00 500 [IU]

## 2018-02-14 MED ORDER — FLUTICASONE-UMECLIDIN-VILANT 100-62.5-25 MCG/INH IN AEPB
Freq: Every day | RESPIRATORY_TRACT | 11 refills | Status: DC
Start: 2018-02-14 — End: 2018-02-23

## 2018-02-14 MED ORDER — SODIUM CHLORIDE 0.9 % IV BOLUS
0.9 % | Freq: Once | INTRAVENOUS | Status: AC
Start: 2018-02-14 — End: 2018-02-14
  Administered 2018-02-14: 17:00:00 500 mL via INTRAVENOUS

## 2018-02-14 MED FILL — HEPARIN LOCK FLUSH 100 UNIT/ML IV SOLN: 100 [IU]/mL | INTRAVENOUS | Qty: 5

## 2018-02-14 NOTE — Telephone Encounter (Addendum)
1. Trelegy sample has worked well for pt. Please fill pending Rx    2. Patient has hospital follow up on Tuesday 02/22/2018. Spouse has missed a lot of work with pt being in hospital. She is off on Mondays and would like to know if it is possible to come in for follow up on Monday 02/21/2018? Please advise. Thank you.    Spouse called (hippa) [name]. She asked for call back at [phone]    Unfortunately, insurance will not cover Trelegy. Would you like to have pt start Breo again?   Also, please advise if pt can be seen 02/21/2018 instead of 02/22/2018 due to spouse has missed too much work.  Thank you

## 2018-02-14 NOTE — Plan of Care (Addendum)
Pt seen and assessed at [facility] today for hydration infusion per orders from Dr. Lynnette Caffey.  Infused per [facility] policy.  Monitoring completed for infusion reactions - see flowsheet.  Pt tolerated infusion well and without incident.  Pt verbalizes understanding of discharge instructions.  Discharged via wheelchair to home with wife.  Problem: Falls - Risk of:  Goal: Will remain free from falls  Description  Will remain free from falls     Note:     Explained fall risk precautions to pt & wife and rationale behind their use to keep the patient safe. Belongings are in reach. Pt encouraged to notify staff for any and all assistance. Staff present in tx suite throughout entirety of pts treatment to monitor and protect from falls.  Assistance provided when ambulating to restroom utilizing Stay With Me.      Note below is written in error.  Problem: KNOWLEDGE DEFICIT  Goal: Patient/S.O. demonstrates understanding of disease process, treatment plan, medications, and discharge instructions.  Note:   Patient's hemoglobin this AM: No results for input(s): HGB in the last 72 hours.   Patient's platelet count this AM: 19  Pt seen and assessed at [facility].  Seen at [facility] today for one bag of platelets  per standing orders for above lab values.  Blood products transfused per [facility] policy.  Pt tolerated transfusion well and without incident.  Pt verbalizes understanding of discharge instructions.  Discharged via wheelchair to home with husband and family.

## 2018-02-16 ENCOUNTER — Inpatient Hospital Stay: Admit: 2018-02-16 | Discharge: 2018-02-16 | Payer: BLUE CROSS/BLUE SHIELD | Primary: Hematology & Oncology

## 2018-02-16 DIAGNOSIS — C9000 Multiple myeloma not having achieved remission: Secondary | ICD-10-CM

## 2018-02-16 LAB — CULTURE, RESPIRATORY: CULTURE, RESPIRATORY: NORMAL

## 2018-02-16 LAB — CULTURE, VIRUS, RESPIRATORY

## 2018-02-16 MED ORDER — HEPARIN SOD (PORK) LOCK FLUSH 100 UNIT/ML IV SOLN
100 UNIT/ML | INTRAVENOUS | Status: DC | PRN
Start: 2018-02-16 — End: 2018-02-17
  Administered 2018-02-16: 16:00:00 500 [IU]

## 2018-02-16 MED FILL — HEPARIN LOCK FLUSH 100 UNIT/ML IV SOLN: 100 [IU]/mL | INTRAVENOUS | Qty: 5

## 2018-02-16 NOTE — Telephone Encounter (Signed)
I saw him in the infusion center on 02/14/2018.  Started on Acapella therapy.  He should return for follow-up on 02/21/2018

## 2018-02-16 NOTE — Plan of Care (Signed)
Problem: Falls - Risk of:  Goal: Will remain free from falls  Description  Will remain free from falls     Outcome: Ongoing  Note:   Pt is a high fall risk.   Explained fall risk precautions to pt & girlfriend and rationale behind their use to keep the patient safe. Belongings are in reach. Pt encouraged to notify staff for any and all assistance. Staff present in tx suite throughout entirety of pts treatment to monitor and protect from falls.  Assistance provided when ambulating to restroom utilizing Stay With Me.        Problem: Bleeding:  Goal: Will show no signs and symptoms of excessive bleeding  Description  Will show no signs and symptoms of excessive bleeding   Outcome: Ongoing  Note:   Patient's hemoglobin this AM: 7.6  Patient's platelet count this AM: 32  Pt seen and assessed at [facility].  Seen at [facility] today for PRBC transfusion per standing orders for above lab values.  Blood products transfused per [facility] policy.  Pt tolerated transfusion well and without incident. Patient develop temperature of 99.8 after transfusion. Dr Lynnette Caffey made aware. Patient to continue to monitor temp through the night. Pt verbalizes understanding of discharge instructions.  Discharged via wheelchair} to home with girlfriend.

## 2018-02-16 NOTE — Discharge Instructions (Signed)
Preventing Falls: Care Instructions  Your Care Instructions    Getting around your home safely can be a challenge if you have injuries or health problems that make it easy for you to fall. Loose rugs and furniture in walkways are among the dangers for many older people who have problems walking or who have poor eyesight. People who have conditions such as arthritis, osteoporosis, or dementia also have to be careful not to fall.  You can make your home safer with a few simple measures.  Follow-up care is a key part of your treatment and safety. Be sure to make and go to all appointments, and call your doctor if you are having problems. It's also a good idea to know your test results and keep a list of the medicines you take.  How can you care for yourself at home?  Taking care of yourself   You may get dizzy if you do not drink enough water. To prevent dehydration, drink plenty of fluids, enough so that your urine is light yellow or clear like water. Choose water and other caffeine-free clear liquids. If you have kidney, heart, or liver disease and have to limit fluids, talk with your doctor before you increase the amount of fluids you drink.   Exercise regularly to improve your strength, muscle tone, and balance. Walk if you can. Swimming may be a good choice if you cannot walk easily.   Have your vision and hearing checked each year or any time you notice a change. If you have trouble seeing and hearing, you might not be able to avoid objects and could lose your balance.   Know the side effects of the medicines you take. Ask your doctor or pharmacist whether the medicines you take can affect your balance. Sleeping pills or sedatives can affect your balance.   Limit the amount of alcohol you drink. Alcohol can impair your balance and other senses.   Ask your doctor whether calluses or corns on your feet need to be removed. If you wear loose-fitting shoes because of calluses or corns, you can lose your  balance and fall.   Talk to your doctor if you have numbness in your feet.  Preventing falls at home   Remove raised doorway thresholds, throw rugs, and clutter. Repair loose carpet or raised areas in the floor.   Move furniture and electrical cords to keep them out of walking paths.   Use nonskid floor wax, and wipe up spills right away, especially on ceramic tile floors.   If you use a walker or cane, put rubber tips on it. If you use crutches, clean the bottoms of them regularly with an abrasive pad, such as steel wool.   Keep your house well lit, especially stairways, porches, and outside walkways. Use night-lights in areas such as hallways and bathrooms. Add extra light switches or use remote switches (such as switches that go on or off when you clap your hands) to make it easier to turn lights on if you have to get up during the night.   Install sturdy handrails on stairways.   Move items in your cabinets so that the things you use a lot are on the lower shelves (about waist level).   Keep a cordless phone and a flashlight with new batteries by your bed. If possible, put a phone in each of the main rooms of your house, or carry a cell phone in case you fall and cannot reach a phone. Or, you can   wear a device around your neck or wrist. You push a button that sends a signal for help.   Wear low-heeled shoes that fit well and give your feet good support. Use footwear with nonskid soles. Check the heels and soles of your shoes for wear. Repair or replace worn heels or soles.   Do not wear socks without shoes on wood floors.   Walk on the grass when the sidewalks are slippery. If you live in an area that gets snow and ice in the winter, sprinkle salt on slippery steps and sidewalks.  Preventing falls in the bath   Install grab bars and nonskid mats inside and outside your shower or tub and near the toilet and sinks.   Use shower chairs and bath benches.   Use a hand-held shower head that will allow  you to sit while showering.   Get into a tub or shower by putting the weaker leg in first. Get out of a tub or shower with your strong side first.   Repair loose toilet seats and consider installing a raised toilet seat to make getting on and off the toilet easier.   Keep your bathroom door unlocked while you are in the shower.  Where can you learn more?  Go to [email or web address] and sign in to your MyChart account. Enter G117 in the Search Health Information box to learn more about "Preventing Falls: Care Instructions."     If you do not have an account, please click on the "Sign Up Now" link.  Current as of: July 07, 2017  Content Version: 12.0   2006-2019 [employer]. Care instructions adapted under license by [facility]. If you have questions about a medical condition or this instruction, always ask your healthcare professional. Health[employer], Incorporated disclaims any warranty or liability for your use of this information.         Learning About Blood Transfusions  What is a blood transfusion?    Blood transfusion is a medical treatment to replace the blood or parts of blood that your body has lost. The blood goes through a tube from a bag to an intravenous (IV) catheter and into your vein.  You may need a blood transfusion after losing blood from an injury, a major surgery, an illness that causes bleeding, or an illness that destroys blood cells.  Transfusions are also used to give you the parts of blood--such as platelets, plasma, or substances that cause clotting--that your body needs to fight an illness or stop bleeding.  How is a blood transfusion done?  Before you receive a blood transfusion, your blood is tested to find out what your blood type is. Blood or blood parts that are a match with your blood type are ordered by your doctor. Blood is typed as A, B, AB, or O. It is also typed as Rh-positive or Rh-negative.  Your blood is also screened to look for  antibodies that might react with the blood that is given to you. The blood you are getting is checked and rechecked to make sure that it's the right type for you.  A sample of your blood is mixed with a sample of the blood you will receive to check for problems. Before actually giving you the transfusion, a doctor and nurses will look at the label on the package of blood and compare it to your hospital ID bracelet and medical records. The transfusion begins only when all agree that this is the correct blood and that you   are the correct person to receive it.  To receive the transfusion, you will have an intravenous (IV) catheter inserted into a vein. A tube connects the catheter to the bag containing the blood, which is placed higher than your body. The blood then flows slowly into your vein. A doctor or nurse will check you several times during the transfusion to watch for a reaction or other problems.  What are the possible risks?  Blood transfusions have many benefits and are often life-saving. But they also have a few risks. Possible risks include:   Your body's reaction to receiving new blood. This may include:  ? Fever.  ? Allergic reactions.  ? Breathing problems.   An infection from the blood. This risk is small because of the strict rules placed on handling and storing blood. Getting a viral infection, such as HIV or hepatitis B or C, through blood transfusions has become very rare. The U.S. Food and Drug Administration (FDA) enforces strict guidelines on the collection, testing, storage, and use of blood.   Getting the wrong blood type by accident. Severe reactions, which can be life-threatening, are very rare.  What can you expect after a blood transfusion?  Here are some things you can do at home to help prevent infection at the transfusion site:   Wash the area daily with warm, soapy water, and pat it dry. Don't use hydrogen peroxide or alcohol, which can slow healing. You may cover the area with a  gauze bandage if it weeps or rubs against clothing. Change the bandage every day.   Keep the area clean and dry.  When should you call for help?  Call 911 anytime you think you may need emergency care. For example, call if:   You have severe trouble breathing.  Call your doctor now or seek immediate medical care if:   You have a fever.   You feel weaker or more tired than usual.   You have a yellow tint to your skin or the whites of your eyes.  Watch closely for changes in your health, and be sure to contact your doctor if you have any problems.  Follow-up care is a key part of your treatment and safety. Be sure to make and go to all appointments, and call your doctor if you are having problems. It's also a good idea to know your test results and keep a list of the medicines you take.  Where can you learn more?  Go to [email or web address] and sign in to your MyChart account. Enter V588 in the Search Health Information box to learn more about "Learning About Blood Transfusions."     If you do not have an account, please click on the "Sign Up Now" link.  Current as of: Jan 03, 2017  Content Version: 12.0   2006-2019 [employer]. Care instructions adapted under license by [facility]. If you have questions about a medical condition or this instruction, always ask your healthcare professional. Health[employer], [employer] disclaims any warranty or liability for your use of this information.

## 2018-02-17 NOTE — Telephone Encounter (Signed)
appt changed and I lm on vm for [name] to cb to confirm appt 02/21/2018 @ 2pm

## 2018-02-18 LAB — PREPARE RBC (CROSSMATCH): Dispense Status Blood Bank: TRANSFUSED

## 2018-02-21 ENCOUNTER — Inpatient Hospital Stay: Admit: 2018-02-21 | Discharge: 2018-02-21 | Payer: BLUE CROSS/BLUE SHIELD | Primary: Hematology & Oncology

## 2018-02-21 ENCOUNTER — Ambulatory Visit
Admit: 2018-02-21 | Discharge: 2018-02-21 | Payer: BLUE CROSS/BLUE SHIELD | Attending: Pulmonary Disease | Primary: Hematology & Oncology

## 2018-02-21 DIAGNOSIS — C9000 Multiple myeloma not having achieved remission: Secondary | ICD-10-CM

## 2018-02-21 DIAGNOSIS — J449 Chronic obstructive pulmonary disease, unspecified: Secondary | ICD-10-CM

## 2018-02-21 LAB — CULTURE, FUNGUS, BLOOD
Culture, Fungus Blood: NO GROWTH
Culture, Fungus Blood: NO GROWTH

## 2018-02-21 LAB — CULTURE, FUNGUS
Fungus (Mycology) Culture: NO GROWTH
Fungus Stain: NONE SEEN

## 2018-02-21 LAB — ANTIBODY IDENTIFICATION: Antibody ID: POSITIVE

## 2018-02-21 LAB — DAT IGG: DAT IgG Capture: NEGATIVE

## 2018-02-21 LAB — TYPE AND SCREEN
ABO/Rh: O POS
Antibody Screen: POSITIVE

## 2018-02-21 MED ORDER — TIOTROPIUM BROMIDE MONOHYDRATE 18 MCG IN CAPS
18 MCG | ORAL_CAPSULE | Freq: Every day | RESPIRATORY_TRACT | 11 refills | Status: AC
Start: 2018-02-21 — End: 2019-02-21

## 2018-02-21 NOTE — Progress Notes (Signed)
Pt in OPO, here for type and screen, pt to receive prbc transfusion 06/26 per MD Essell. Type and screen drawn via right upper port per RN, specimen sent to lab. Pt and pt's wife v/u of discharge instructions, pt d/c'd via wheelchair to home with wife.

## 2018-02-21 NOTE — Progress Notes (Signed)
Subjective:      Patient ID: David Watkins is a 62 y.o. male.    HPI David Watkins returns for one-week follow-up.  He was seen at the infusion center at [facility], with increased shortness of breath and complaints of inability to clear his secretions well.  We obtained a hand-held percussive device for him, which she has been using daily.  This has improved secretion clearance, and breathing has been much better.  He used samples of Trelegy inhaler, but when this ran out, he found it was not covered by insurance and cost $600.  He went back to using Breo for the past 2 days.  Today, he feels significantly worse.  Also note that the barometer has dropped with a weather front coming in.  He started a new chemotherapy regimen 3 days ago.  This includes a significant dose of Decadron.  He will be receiving this either every 2 weeks or every 4 weeks    Review of Systems    Objective:   Physical Exam   Constitutional: He is oriented to person, place, and time. He appears well-developed and well-nourished.   HENT:   Head: Normocephalic and atraumatic.   Mouth/Throat: Oropharynx is clear and moist. No oropharyngeal exudate.   Eyes: Conjunctivae are normal. Right eye exhibits no discharge. No scleral icterus.   Neck: No tracheal deviation present. No thyromegaly present.   Cardiovascular: Normal rate, regular rhythm, normal heart sounds and intact distal pulses.   No murmur heard.  Pulmonary/Chest: Effort normal. No stridor. No respiratory distress. He has no wheezes.   Breast sounds mildly reduced throughout, somewhat more so in lung bases.  Bibasilar rhonchi and coarse crackles.   Musculoskeletal: He exhibits no edema.   Lymphadenopathy:     He has no cervical adenopathy.   Neurological: He is alert and oriented to person, place, and time.   Skin: Skin is warm and dry.   Psychiatric: He has a normal mood and affect. His behavior is normal. Judgment and thought content normal.       Assessment:      Severe COPD with a  significant component of chronic bronchitis.  Secretion clearance has improved with handheld percussive device.  Being off muscarinic antagonist inhaler for the past 2 days may have had a significant impact on airway function.  This could also be a problem related to drop in barometer.  Prednisone induced myopathy also limits ambulation.      Plan:      Continue Breo Ellipta inhaler, add Spiriva inhaler.  His wife is familiar with how this inhaler works and understood the instructions given.  Continue to reduce prednisone per previous schedule.  Plan to wean off of this.  Note that he will be receiving Decadron with chemotherapy, frequency not determined yet.        Charleen Kirks, MD

## 2018-02-23 ENCOUNTER — Inpatient Hospital Stay: Admit: 2018-02-23 | Discharge: 2018-02-23 | Payer: BLUE CROSS/BLUE SHIELD | Primary: Hematology & Oncology

## 2018-02-23 ENCOUNTER — Inpatient Hospital Stay: Admit: 2018-02-23 | Payer: BLUE CROSS/BLUE SHIELD | Primary: Hematology & Oncology

## 2018-02-23 ENCOUNTER — Inpatient Hospital Stay
Admit: 2018-02-23 | Discharge: 2018-02-28 | Disposition: A | Discharge status: Home Health | Payer: BLUE CROSS/BLUE SHIELD | Source: Ambulatory Visit | Attending: Hematology & Oncology | Admitting: Hematology & Oncology

## 2018-02-23 DIAGNOSIS — C9000 Multiple myeloma not having achieved remission: Secondary | ICD-10-CM

## 2018-02-23 DIAGNOSIS — J440 Chronic obstructive pulmonary disease with acute lower respiratory infection: Principal | ICD-10-CM

## 2018-02-23 LAB — PROTIME-INR
INR: 1.3 — ABNORMAL HIGH (ref 0.86–1.14)
Protime: 14.8 s — ABNORMAL HIGH (ref 9.8–13.0)

## 2018-02-23 LAB — URINALYSIS
Bilirubin Urine: NEGATIVE
Glucose, Ur: NEGATIVE mg/dL
Ketones, Urine: NEGATIVE mg/dL
Leukocyte Esterase, Urine: NEGATIVE
Nitrite, Urine: NEGATIVE
Protein, UA: 100 mg/dL — AB
Specific Gravity, UA: 1.02 (ref 1.005–1.030)
Urobilinogen, Urine: 0.2 E.U./dL (ref <2.0)
pH, UA: 6 (ref 5.0–8.0)

## 2018-02-23 LAB — BASIC METABOLIC PANEL
Anion Gap: 12 (ref 3–16)
BUN: 12 mg/dL (ref 7–20)
CO2: 24 mmol/L (ref 21–32)
Calcium: 8.6 mg/dL (ref 8.3–10.6)
Chloride: 94 mmol/L — ABNORMAL LOW (ref 99–110)
Creatinine: 0.7 mg/dL — ABNORMAL LOW (ref 0.8–1.3)
GFR African American: >60 (ref >60)
GFR Non-African American: >60 (ref >60)
Glucose: 154 mg/dL — ABNORMAL HIGH (ref 70–99)
Potassium: 4 mmol/L (ref 3.5–5.1)
Sodium: 130 mmol/L — ABNORMAL LOW (ref 136–145)

## 2018-02-23 LAB — HEPATIC FUNCTION PANEL
ALT: 27 U/L (ref 10–40)
AST: 12 U/L — ABNORMAL LOW (ref 15–37)
Albumin: 2.8 g/dL — ABNORMAL LOW (ref 3.4–5.0)
Alkaline Phosphatase: 127 U/L (ref 40–129)
Bilirubin, Direct: <0.2 mg/dL (ref 0.0–0.3)
Total Bilirubin: 0.4 mg/dL (ref 0.0–1.0)
Total Protein: 6.3 g/dL — ABNORMAL LOW (ref 6.4–8.2)

## 2018-02-23 LAB — MICROSCOPIC URINALYSIS: RBC, UA: NONE SEEN /HPF (ref 0–2)

## 2018-02-23 LAB — CBC WITH AUTO DIFFERENTIAL
Basophils %: 0.3 %
Basophils Absolute: 0 10*3/uL (ref 0.0–0.2)
Eosinophils %: 0 %
Eosinophils Absolute: 0 10*3/uL (ref 0.0–0.6)
Hematocrit: 21 % — ABNORMAL LOW (ref 40.5–52.5)
Hemoglobin: 7.1 g/dL — ABNORMAL LOW (ref 13.5–17.5)
Lymphocytes %: 3.1 %
Lymphocytes Absolute: 0.2 10*3/uL — ABNORMAL LOW (ref 1.0–5.1)
MCH: 30.4 pg (ref 26.0–34.0)
MCHC: 34.1 g/dL (ref 31.0–36.0)
MCV: 89.1 fL (ref 80.0–100.0)
MPV: 8.6 fL (ref 5.0–10.5)
Monocytes %: 4.4 %
Monocytes Absolute: 0.3 10*3/uL (ref 0.0–1.3)
Neutrophils %: 92.2 %
Neutrophils Absolute: 7.2 10*3/uL (ref 1.7–7.7)
Platelets: 24 10*3/uL — ABNORMAL LOW (ref 135–450)
RBC: 2.32 M/uL — ABNORMAL LOW (ref 4.20–5.90)
RDW: 19.7 % — ABNORMAL HIGH (ref 12.4–15.4)
WBC: 7.8 10*3/uL (ref 4.0–11.0)

## 2018-02-23 LAB — LACTIC ACID: Lactic Acid: 2.3 mmol/L — ABNORMAL HIGH (ref 0.4–2.0)

## 2018-02-23 LAB — EKG 12-LEAD
Atrial Rate: 76 {beats}/min
P Axis: 56 degrees
P-R Interval: 156 ms
Q-T Interval: 380 ms
QRS Duration: 94 ms
QTc Calculation (Bazett): 427 ms
R Axis: 67 degrees
T Axis: 32 degrees
Ventricular Rate: 76 {beats}/min

## 2018-02-23 LAB — APTT: aPTT: 27.7 s (ref 26.0–36.0)

## 2018-02-23 LAB — MAGNESIUM: Magnesium: 1.6 mg/dL — ABNORMAL LOW (ref 1.80–2.40)

## 2018-02-23 LAB — LACTATE DEHYDROGENASE: LD: 261 U/L — ABNORMAL HIGH (ref 100–190)

## 2018-02-23 LAB — URIC ACID: Uric Acid: 2.1 mg/dL — ABNORMAL LOW (ref 3.5–7.2)

## 2018-02-23 LAB — PHOSPHORUS: Phosphorus: 3.8 mg/dL (ref 2.5–4.9)

## 2018-02-23 MED ORDER — K PHOS MONO-SOD PHOS DI & MONO 155-852-130 MG PO TABS
155-852-130 MG | Freq: Two times a day (BID) | ORAL | Status: DC
Start: 2018-02-23 — End: 2018-02-28
  Administered 2018-02-24 – 2018-02-28 (×10): 1 via ORAL

## 2018-02-23 MED ORDER — AMIODARONE HCL 200 MG PO TABS
200 MG | Freq: Every day | ORAL | Status: DC
Start: 2018-02-23 — End: 2018-02-28
  Administered 2018-02-24 – 2018-02-28 (×5): 200 mg via ORAL

## 2018-02-23 MED ORDER — ATORVASTATIN CALCIUM 20 MG PO TABS
20 MG | Freq: Every evening | ORAL | Status: DC
Start: 2018-02-23 — End: 2018-02-28
  Administered 2018-02-24 – 2018-02-28 (×5): 10 mg via ORAL

## 2018-02-23 MED ORDER — NORMAL SALINE FLUSH 0.9 % IV SOLN
0.9 % | INTRAVENOUS | Status: DC | PRN
Start: 2018-02-23 — End: 2018-02-28

## 2018-02-23 MED ORDER — SODIUM CHLORIDE 0.9 % IV SOLN
0.9 % | INTRAVENOUS | Status: DC
Start: 2018-02-23 — End: 2018-02-28
  Administered 2018-02-23 – 2018-02-26 (×3): via INTRAVENOUS

## 2018-02-23 MED ORDER — SALINE MOUTHWASH
Status: DC | PRN
Start: 2018-02-23 — End: 2018-02-28

## 2018-02-23 MED ORDER — MAGNESIUM HYDROXIDE 400 MG/5ML PO SUSP
400 MG/5ML | Freq: Every day | ORAL | Status: DC | PRN
Start: 2018-02-23 — End: 2018-02-28

## 2018-02-23 MED ORDER — BISACODYL 5 MG PO TBEC
5 MG | Freq: Every day | ORAL | Status: DC | PRN
Start: 2018-02-23 — End: 2018-02-28
  Administered 2018-02-26: 13:00:00 10 mg via ORAL

## 2018-02-23 MED ORDER — ONDANSETRON HCL 8 MG PO TABS
8 MG | Freq: Three times a day (TID) | ORAL | Status: DC | PRN
Start: 2018-02-23 — End: 2018-02-28

## 2018-02-23 MED ORDER — HEPARIN SOD (PORK) LOCK FLUSH 100 UNIT/ML IV SOLN
100 UNIT/ML | INTRAVENOUS | Status: DC | PRN
Start: 2018-02-23 — End: 2018-02-24

## 2018-02-23 MED ORDER — ISAVUCONAZONIUM SULFATE 186 MG PO CAPS
186 MG | Freq: Every day | ORAL | Status: DC
Start: 2018-02-23 — End: 2018-02-28
  Administered 2018-02-24 – 2018-02-28 (×5): 372 mg via ORAL

## 2018-02-23 MED ORDER — DILTIAZEM HCL ER COATED BEADS 240 MG PO CP24
240 MG | Freq: Every day | ORAL | Status: DC
Start: 2018-02-23 — End: 2018-02-28
  Administered 2018-02-24 – 2018-02-28 (×5): 240 mg via ORAL

## 2018-02-23 MED ORDER — SERTRALINE HCL 50 MG PO TABS
50 MG | Freq: Every day | ORAL | Status: DC
Start: 2018-02-23 — End: 2018-02-28
  Administered 2018-02-24 – 2018-02-28 (×5): 50 mg via ORAL

## 2018-02-23 MED ORDER — ACETAMINOPHEN 325 MG PO TABS
325 MG | Freq: Once | ORAL | Status: AC | PRN
Start: 2018-02-23 — End: 2018-02-23
  Administered 2018-02-23: 16:00:00 650 mg via ORAL

## 2018-02-23 MED ORDER — DIPHENHYDRAMINE HCL 25 MG PO TABS
25 MG | Freq: Once | ORAL | Status: DC | PRN
Start: 2018-02-23 — End: 2018-02-23

## 2018-02-23 MED ORDER — POMALIDOMIDE 2 MG PO CAPS
2 MG | Freq: Every day | ORAL | Status: DC
Start: 2018-02-23 — End: 2018-02-28
  Administered 2018-02-24 – 2018-02-28 (×5): 2 mg via ORAL

## 2018-02-23 MED ORDER — POTASSIUM CHLORIDE 20 MEQ/50ML IV SOLN
20 MEQ/50ML | INTRAVENOUS | Status: DC | PRN
Start: 2018-02-23 — End: 2018-02-28
  Administered 2018-02-26 – 2018-02-27 (×8): 20 meq via INTRAVENOUS

## 2018-02-23 MED ORDER — SODIUM CHLORIDE 0.9 % IV SOLN
0.9 % | INTRAVENOUS | Status: AC
Start: 2018-02-23 — End: 2018-02-24

## 2018-02-23 MED ORDER — OXYCODONE-ACETAMINOPHEN 7.5-325 MG PO TABS
ORAL | Status: DC | PRN
Start: 2018-02-23 — End: 2018-02-28
  Administered 2018-02-24 – 2018-02-28 (×10): 1 via ORAL

## 2018-02-23 MED ORDER — LIDOCAINE-PRILOCAINE 2.5-2.5 % EX CREA
CUTANEOUS | Status: DC | PRN
Start: 2018-02-23 — End: 2018-02-28

## 2018-02-23 MED ORDER — FORMOTEROL FUMARATE 20 MCG/2ML IN NEBU
20 MCG/2ML | Freq: Two times a day (BID) | RESPIRATORY_TRACT | Status: DC
Start: 2018-02-23 — End: 2018-02-28
  Administered 2018-02-24 – 2018-02-28 (×10): 20 ug via RESPIRATORY_TRACT

## 2018-02-23 MED ORDER — MAGNESIUM SULFATE 4000 MG/100 ML IVPB PREMIX
4 GM/100ML | INTRAVENOUS | Status: DC | PRN
Start: 2018-02-23 — End: 2018-02-28

## 2018-02-23 MED ORDER — PANTOPRAZOLE SODIUM 40 MG PO TBEC
40 MG | Freq: Every day | ORAL | Status: DC
Start: 2018-02-23 — End: 2018-02-28
  Administered 2018-02-24 – 2018-02-28 (×5): 40 mg via ORAL

## 2018-02-23 MED ORDER — VALACYCLOVIR HCL 500 MG PO TABS
500 MG | Freq: Two times a day (BID) | ORAL | Status: DC
Start: 2018-02-23 — End: 2018-02-28
  Administered 2018-02-24 – 2018-02-28 (×10): 500 mg via ORAL

## 2018-02-23 MED ORDER — DEXTROSE 5 % IV SOLN (MINI-BAG)
5 % | Freq: Four times a day (QID) | INTRAVENOUS | Status: DC
Start: 2018-02-23 — End: 2018-02-28
  Administered 2018-02-23 – 2018-02-28 (×20): 4.5 g via INTRAVENOUS

## 2018-02-23 MED ORDER — TIOTROPIUM BROMIDE MONOHYDRATE 18 MCG IN CAPS
18 MCG | Freq: Every day | RESPIRATORY_TRACT | Status: DC
Start: 2018-02-23 — End: 2018-02-28
  Administered 2018-02-24 – 2018-02-28 (×5): 18 ug via RESPIRATORY_TRACT

## 2018-02-23 MED ORDER — GUAIFENESIN ER 600 MG PO TB12
600 MG | Freq: Two times a day (BID) | ORAL | Status: DC
Start: 2018-02-23 — End: 2018-02-25
  Administered 2018-02-24 – 2018-02-25 (×4): 600 mg via ORAL

## 2018-02-23 MED ORDER — GABAPENTIN 300 MG PO CAPS
300 MG | Freq: Three times a day (TID) | ORAL | Status: DC
Start: 2018-02-23 — End: 2018-02-28
  Administered 2018-02-23 – 2018-02-28 (×15): 300 mg via ORAL

## 2018-02-23 MED ORDER — NORMAL SALINE FLUSH 0.9 % IV SOLN
0.9 % | Freq: Two times a day (BID) | INTRAVENOUS | Status: DC
Start: 2018-02-23 — End: 2018-02-28
  Administered 2018-02-24 – 2018-02-28 (×7): 10 mL via INTRAVENOUS

## 2018-02-23 MED ORDER — PROCHLORPERAZINE MALEATE 10 MG PO TABS
10 MG | Freq: Four times a day (QID) | ORAL | Status: DC | PRN
Start: 2018-02-23 — End: 2018-02-28

## 2018-02-23 MED ORDER — SODIUM CHLORIDE 0.9 % IV SOLN
0.9 % | INTRAVENOUS | Status: DC | PRN
Start: 2018-02-23 — End: 2018-02-28

## 2018-02-23 MED ORDER — ALTEPLASE 2 MG IJ SOLR
2 MG | INTRAMUSCULAR | Status: DC | PRN
Start: 2018-02-23 — End: 2018-02-28

## 2018-02-23 MED ORDER — PREDNISONE 5 MG PO TABS
5 MG | Freq: Every day | ORAL | Status: AC
Start: 2018-02-23 — End: 2018-02-27
  Administered 2018-02-24 – 2018-02-27 (×4): 5 mg via ORAL

## 2018-02-23 MED ORDER — FLUTICASONE FUROATE-VILANTEROL 200-25 MCG/INH IN AEPB
200-25 MCG/INH | Freq: Every day | RESPIRATORY_TRACT | Status: DC
Start: 2018-02-23 — End: 2018-02-23

## 2018-02-23 MED ORDER — OYSTER SHELL CALCIUM 500 MG PO TABS
500 MG | Freq: Two times a day (BID) | ORAL | Status: DC
Start: 2018-02-23 — End: 2018-02-28
  Administered 2018-02-24 – 2018-02-28 (×10): 500 mg via ORAL

## 2018-02-23 MED ORDER — SALINE MOUTHWASH
Freq: Four times a day (QID) | Status: DC
Start: 2018-02-23 — End: 2018-02-28
  Administered 2018-02-23 – 2018-02-28 (×8): 15 mL via ORAL

## 2018-02-23 MED ORDER — BUDESONIDE 0.5 MG/2ML IN SUSP
0.5 MG/2ML | Freq: Two times a day (BID) | RESPIRATORY_TRACT | Status: DC
Start: 2018-02-23 — End: 2018-02-28
  Administered 2018-02-24 – 2018-02-28 (×10): 500 mg via RESPIRATORY_TRACT

## 2018-02-23 MED ORDER — DIPHENHYDRAMINE HCL 25 MG PO TABS
25 MG | ORAL | Status: DC | PRN
Start: 2018-02-23 — End: 2018-02-28

## 2018-02-23 MED ORDER — ALBUTEROL SULFATE (2.5 MG/3ML) 0.083% IN NEBU
RESPIRATORY_TRACT | Status: DC | PRN
Start: 2018-02-23 — End: 2018-02-28
  Administered 2018-02-25: 14:00:00 2.5 mg via RESPIRATORY_TRACT

## 2018-02-23 MED ORDER — SENNA-DOCUSATE SODIUM 8.6-50 MG PO TABS
Freq: Every day | ORAL | Status: DC
Start: 2018-02-23 — End: 2018-02-28
  Administered 2018-02-23 – 2018-02-28 (×6): 2 via ORAL

## 2018-02-23 MED FILL — CRESEMBA 186 MG PO CAPS: 186 mg | ORAL | Qty: 2

## 2018-02-23 MED FILL — STIMULANT LAXATIVE 5 MG PO TBEC: 5 mg | ORAL | Qty: 2

## 2018-02-23 MED FILL — GABAPENTIN 300 MG PO CAPS: 300 mg | ORAL | Qty: 1

## 2018-02-23 MED FILL — SENNOSIDES-DOCUSATE SODIUM 8.6-50 MG PO TABS: 8.6-50 mg | ORAL | Qty: 2

## 2018-02-23 MED FILL — ACETAMINOPHEN 325 MG PO TABS: 325 mg | ORAL | Qty: 2

## 2018-02-23 MED FILL — PIPERACILLIN SOD-TAZOBACTAM SO 4.5 (4-0.5) G IV SOLR: 4.5 (4-0.5) g | INTRAVENOUS | Qty: 4.5

## 2018-02-23 MED FILL — SODIUM CHLORIDE 0.9 % IV SOLN: 0.9 % | INTRAVENOUS | Qty: 1000

## 2018-02-23 NOTE — Plan of Care (Signed)
Problem: Bleeding:  Goal: Will show no signs and symptoms of excessive bleeding  Description  Will show no signs and symptoms of excessive bleeding  Outcome: Ongoing    Patient's hemoglobin this AM:   Recent Labs     02/23/18  1430   HGB 7.1*     Patient's platelet count this AM:   Recent Labs     02/23/18  1430   PLT 24*    Thrombocytopenia Precautions in place.  Patient showing no signs or symptoms of active bleeding.  Transfusion not indicated at this time.  Patient verbalizes understanding of all instructions. Will continue to assess and implement POC. Call light within reach and hourly rounding in place.       Problem: Infection - Central Venous Catheter-Associated Bloodstream Infection:  Goal: Will show no infection signs and symptoms  Description  Will show no infection signs and symptoms  Outcome: Ongoing    CVC site remains free of signs/symptoms of infection. No drainage, edema, erythema, pain, or warmth noted at site. Dressing changes continue per protocol and on an as needed basis - see flowsheet.       Problem: Falls - Risk of:  Goal: Will remain free from falls  Description  Will remain free from falls  Outcome: Ongoing    Pt is a high fall risk. Up with assistance and walker.  BEA active, call bell w/in reach.   RN to monitor.       Problem: Nutrition Deficit:  Goal: Ability to achieve adequate nutritional intake will improve  Description  Ability to achieve adequate nutritional intake will improve  Outcome: Ongoing

## 2018-02-23 NOTE — Progress Notes (Signed)
4 Eyes Admission Assessment     I agree as the admission nurse that 2 RN's have performed a thorough Head to Toe Skin Assessment on the patient. ALL assessment sites listed below have been assessed on admission.       Areas assessed by both nurses: Worthy Rancher, RN  [x]    Head, Face, and Ears   [x]    Shoulders, Back, and Chest  [x]    Arms, Elbows, and Hands   [x]    Coccyx, Sacrum, and Ischum  [x]    Legs, Feet, and Heels        Does the Patient have Skin Breakdown?  No -- Pt does have a large bruise that has been their for awhile per pt/family on his right mid back.      Braden Prevention initiated:  No   Wound Care Orders initiated:  No      WOC nurse consulted for Pressure Injury (Stage 3,4, Unstageable, DTI, NWPT, and Complex wounds):  No      Nurse 1 eSignature: Electronically signed by Anastasia Fiedler, RN on 02/23/18 at 7:21 PM    **SHARE this note so that the co-signing nurse is able to place an eSignature**    Nurse 2 eSignature: Electronically signed by Inez Pilgrim, RN on 02/23/18 at 8:25 PM

## 2018-02-23 NOTE — Progress Notes (Signed)
CC: Pneumonia    David Watkins presented to oncology office today with fever in excess of 102 ??F.  He states he had been feeling well, and has no complaints of shortness of breath.  He has modest cough and sputum.  No purulent sputum.  Denies chest pain.  Current temperature 99.9  WBC 7.8.  Hemoglobin 7.1 g, platelets 24  Empiric antibiotic therapy started with Zosyn.  Prednisone dose 5 mg daily  BP 111/75.  NSR.  S1, S2 normal.  O2 saturation 93% on room air  Slightly bronchial breath sounds right upper lung zone posteriorly.  Otherwise, breath sounds are mildly reduced, without adventitious breath sounds.  Abdomen soft, no tenderness.  No peripheral edema.  Creatinine 0.7.  Sodium 130, potassium 4.4, chloride 94, CO2 24.  Glucose 154  Chest radiograph today shows alveolar infiltrate in the right upper lobe, new finding compared to previous study, CT chest on 02/01/2018    A&P: Pneumonia, likely community-acquired, in patient with multiple myeloma, on active treatment.  He is not neutropenic.  Clinically, he is doing quite well with this.  He has had no exacerbation of respiratory symptoms.  Chronic airways disease stable at this point.  Continue usual inhaled steroid and bronchodilator therapy.  Continuing to wean off dose of systemic steroid  Discussed with Dr. Hunt Oris

## 2018-02-23 NOTE — H&P (Signed)
BCC History and Physical   ??  ??  Attending Physician: Harlene Salts, MD              Primary Care: Harlene Salts, MD                            ??  Name: David Watkins DOB:  1956/03/27  MRN:  [id number]  ??  Admission: 01/12/2018    ??  Date: 01/13/2018  ??  Reason for Admission: Fever      HPI:  ??  David Watkins is a 62 yo male??w/ 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 ??His PMH is also significant for ??HTN, HLD, anxiety/depression, COPD &??peripheral neuropathy. He is most recently been treated with DCEP chemotherapy MM (last tx 01/13/18).??He is followed closely by Dr. Dorann Lodge for COPD and recently been on a steroid taper for COPD exacerbation.   ??  He presented (01/22/18) to the ED w/ chest pain and shortness of breath despite starting Prednisone early that day and using his home inhalers.  There were concerns for MI d/t EKG changes and elevated troponin and the cath lab was activated.  He underwent echocardiogram (01/23/18) that showed a LVEF 50-55% w/ normal diastolic fxn &??no obvious regional wall motion abnormalities and a possible small/organized pericardial effusion.  He also underwent CTA chest (01/22/18) that showed no PE, patchy groundglass opacities and tree-in-bud changes throughout the??lungs which were new compared to prior examination.  He was evaluated by Dr. Bunnie Philips who believed his chest pain was likely from acute pericarditis and not an MI, so he did not go to the cath lab for angiography.  Pulmonary was consulted and believed he had COPD exacerbation as well contributing to shortness of breath.  He was started on Solumedrol 80 mg TID for management of COPD and pericarditis.  He also started Celebrex since other NSAIDs were contraindicated w/ thrombocytopenia.  He also developed atrial fibrillation on admit and was started on Diltiazem drip and then Amiodarone drip.  When he was admitted he was neutropenic, so he was  pan-cultured and empirically started on Merrem for possible bacterial pneumonia and Eraxis for possible fungal pneumonia.  His Fungitell and galactomannan (01/25/18) were both positive, but blood cultures were negative.     ??  His chest pain and shortness of breath slowly improved.  His atrial fibrillation was controlled on oral Diltiazem, Amiodarone and Metoprolol.  He is now in NSR.  His dyspnea and abnormal lung exam w/ rhonchi and wheezing persisted despite steroids and antibiotics, so he underwent bronchoscopy w/ BAL (02/03/18).  This identified parainfluenza virus, Acinetobacter baumanni & Adenovirus.  He completed 16 days of Merrem (02/08/18) for possible bacterial pneumonia from [place]. ID was consulted for recommendations and they believe he has completed a full course of Merrem and does not need to continue on discharge.  They do recommend continuing antifungal treatment for possible fungal pneumonia, so he was discharged on Cresemba after completing 15 days of Eraxis as inpatient.      ??  He was then discharged and started treatment with Elo/Pom/Dex on 02/18/18. He was tolerating this well but continued with shortness of breath, which was followed closely by Dr. Dorann Lodge. He then presented to outpatient oncology for a blood transfusions on 626/19 and developed a fever >101. Given his immunocompromised state, the decision was made to admit him for IV antibiotics and further work up of his fever. He  does report that his breathing has been better over the past few days. He denies any fevers at home. He's been eating and drinking fairly well.       Past Medical History:   Diagnosis Date   ??? Bone pain    ??? Hyperlipidemia    ??? Leukemia, plasma cell, in relapse ([place])    ??? Low back pain    ??? Multiple myeloma ([place])    ??? Neuropathy     chemo induced, feet          Past Surgical History:   Procedure Laterality Date   ??? BONE MARROW BIOPSY     ??? BONE MARROW TRANSPLANT     ??? BRONCHOSCOPY N/A 02/03/2018     BRONCHOSCOPY WITH BAL, CHOICE ANESTHESIA performed by Neville Route, MD at [place]   ??? BRONCHOSCOPY  02/03/2018    BRONCHOSCOPY THERAPUTIC ASPIRATION SUBSEQUENT performed by Neville Route, MD at [place]   ??? OTHER SURGICAL HISTORY Left 08/17/2016    trifusion cath placement   ??? PRE-MALIGNANT / BENIGN SKIN LESION EXCISION Left 03/29/2017    EXCISE LESION LEFT EXTERNAL EAR WITH FROZEN SECTION, FULL THICKNESS SKIN GRAFT          Family History   Problem Relation Age of Onset   ??? Heart Disease Mother    ??? Lung Cancer Father    ??? Heart Attack Brother           Social History     Tobacco Use   ??? Smoking status: Former Smoker     Last attempt to quit: 11/25/2012     Years since quitting: 5.2   ??? Smokeless tobacco: Never Used   Substance Use Topics   ??? Alcohol use: Yes     Comment: socially   ??? Drug use: No          Allergies   Allergen Reactions   ??? Lenalidomide Rash   ??? Pomalidomide Hives   ??? Ceclor [Cefaclor] Hives            Current Facility-Administered Medications:   ???  albuterol (PROVENTIL) nebulizer solution 2.5 mg, 2.5 mg, Nebulization, Q4H PRN, Josem Kaufmann, PA  ???  amiodarone (CORDARONE) tablet 200 mg, 200 mg, Oral, Daily, Kristen M Brady, [place]  ???  atorvastatin (LIPITOR) tablet 10 mg, 10 mg, Oral, Nightly, Josem Kaufmann, [place]  ???  bisacodyl (DULCOLAX) EC tablet 10 mg, 10 mg, Oral, Daily PRN, Josem Kaufmann, PA  ???  calcium carbonate (OSCAL) tablet 500 mg, 500 mg, Oral, BID, Josem Kaufmann, PA  ???  diltiazem (CARDIZEM CD) extended release capsule 240 mg, 240 mg, Oral, Daily, Josem Kaufmann, [place]  ???  diphenhydrAMINE (BENADRYL) tablet 25 mg, 25 mg, Oral, PRN, Josem Kaufmann, PA  ???  Fluticasone Furoate-Vilanterol 200-25 MCG/INH AEPB 1 Inhaler, 1 Inhaler, Inhalation, Daily, Josem Kaufmann, [place]  ???  gabapentin (NEURONTIN) capsule 300 mg, 300 mg, Oral, TID, Josem Kaufmann, PA  ???  guaiFENesin [facility]) extended release tablet 600 mg, 600 mg, Oral, BID, Kristen M Brady, PA  ???  Isavuconazonium Sulfate CAPS 372  mg, 372 mg, Oral, Daily, Josem Kaufmann, [place]  ???  lidocaine-prilocaine (EMLA) cream, , Topical, PRN, Josem Kaufmann, PA  ???  ondansetron [facility]) tablet 8 mg, 8 mg, Oral, Q8H PRN, Josem Kaufmann, PA  ???  oxyCODONE-acetaminophen (PERCOCET) 7.5-325 MG per tablet 1 tablet, 1 tablet, Oral, Q4H PRN, Josem Kaufmann, PA  ???  [  START ON 02/24/2018] pantoprazole (PROTONIX) tablet 40 mg, 40 mg, Oral, QAM AC, Josem Kaufmann, [place]  ???  phosphorus (K PHOS NEUTRAL) tablet 1 tablet, 1 tablet, Oral, BID, Josem Kaufmann, PA  ???  pomalidomide (POMALYST) chemo capsule 2 mg, 2 mg, Oral, Daily, Josem Kaufmann, PA  ???  predniSONE (DELTASONE) tablet 5 mg, 5 mg, Oral, Daily, Josem Kaufmann, [place]  ???  prochlorperazine (COMPAZINE) tablet 10 mg, 10 mg, Oral, Q6H PRN, Josem Kaufmann, PA  ???  sennosides-docusate sodium (SENOKOT-S) 8.6-50 MG tablet 2 tablet, 2 tablet, Oral, Daily, Josem Kaufmann, [place]  ???  sertraline (ZOLOFT) tablet 50 mg, 50 mg, Oral, Daily, Josem Kaufmann, PA  ???  tiotropium [facility]) inhalation capsule 18 mcg, 18 mcg, Inhalation, Daily, Josem Kaufmann, PA  ???  valACYclovir (VALTREX) tablet 500 mg, 500 mg, Oral, BID, Kristen M Brady, PA  ???  piperacillin-tazobactam (ZOSYN) 4.5 g in dextrose 5 % 100 mL IVPB (mini-bag), 4.5 g, Intravenous, Q6H, Josem Kaufmann, PA  ???  0.9 % sodium chloride infusion, , Intravenous, Continuous, Josem Kaufmann, [place]  ???  0.9 % sodium chloride infusion, 500 mL, Intravenous, Continuous PRN, Josem Kaufmann, PA  ???  sodium chloride flush 0.9 % injection 10 mL, 10 mL, Intravenous, 2 times per day, Josem Kaufmann, PA  ???  sodium chloride flush 0.9 % injection 10 mL, 10 mL, Intravenous, PRN, Josem Kaufmann, PA  ???  potassium chloride 20 mEq/50 mL IVPB (Central Line), 80 mEq, Intravenous, PRN, Josem Kaufmann, PA  ???  magnesium sulfate 4 g in 100 mL IVPB premix, 4 g, Intravenous, PRN, Josem Kaufmann, PA  ???  magnesium hydroxide (MILK OF MAGNESIA) 400 MG/5ML suspension 30 mL, 30 mL, Oral, Daily PRN, Josem Kaufmann,  PA  ???  Saline Mouthwash 15 mL, 15 mL, Swish & Spit, 4x Daily AC & HS, Josem Kaufmann, [place]  ???  Saline Mouthwash 15 mL, 15 mL, Swish & Spit, Q4H PRN, Josem Kaufmann, PA  ???  alteplase (CATHFLO) injection 2 mg, 2 mg, Intracatheter, PRN, Josem Kaufmann, PA    Facility-Administered Medications Ordered in Other Encounters:   ???  heparin flush 100 UNIT/ML injection 500 Units, 500 Units, Intercatheter, PRN, Harlene Salts, MD  ???  diphenhydrAMINE (BENADRYL) tablet 25 mg, 25 mg, Oral, Once PRN, Harlene Salts, MD        Physical Exam:     Vital Signs:  BP 111/75    Pulse 89    Temp 99.9 ??F (37.7 ??C) (Oral)    Resp 22    Wt 187 lb (84.8 kg)    SpO2 93%    BMI 27.62 kg/m??     Weight:    Wt Readings from Last 3 Encounters:   02/23/18 187 lb (84.8 kg)   02/21/18 187 lb (84.8 kg)   02/08/18 193 lb 12.6 oz (87.9 kg)     General: Awake, alert and oriented x 3 but in distress; holding onto bed  HEENT:??normocephalic, alopecia,??PERRL, no scleral erythema or icterus, Oral mucosa moist and intact  NECK: supple without palpable adenopathy  BACK: Straight negative CVAT  SKIN:??warm dry and intact without lesions rashes or masses  CHEST:??Wheezes, crackles thru out; secondary accessory muscle use  CV: Normal S1 S2, RRR, no MRG  ABD:??NT ND normoactive BS, no palpable masses or hepatosplenomegaly  EXTREMITIES:??without edema, denies calf tenderness  NEURO: CN II - XII grossly intact  CATHETER:??Right IJ PAC (  06/21/17, [place])??- CDI      Discharge Laboratory Data:  CBC:   No results for input(s): WBC, HGB, HCT, MCV, PLT in the last 72 hours.  BMP/Mag:  No results for input(s): NA, K, CL, CO2, PHOS, BUN, CREATININE, MG in the last 72 hours.    Invalid input(s): CA  LIVP:   No results for input(s): AST, ALT, LIPASE, BILIDIR, BILITOT, ALKPHOS in the last 72 hours.    Invalid input(s):  AMYLASE,  ALB  Coags:   No results for input(s): PROTIME, INR, APTT in the last 72 hours.  Uric Acid   No results for input(s): LABURIC in the last 72  hours.  Diagnostics:  1. ??PET scan (01/10/18):    ??  Myeloma Labs (01/03/18):  SPEP:????reveals that M2, previously characterized as monoclonal IgG kappa in  mid-to-slow gamma is 1.0 gm/dL, greater than the 0.2 gm/dL observed on 08 Nov 2017.??  SIFE:????Serum immunofixation electrophoresis reveals persistent M2 in mid-to-slow gamma, a monoclonal IgG kappa. ??A recent M-spike, M1, was minor monoclonal IgG lambda in mid-gamma; M1 continues to be absent.  SFLC:  Kappa:  30.80, previously (11/08/17) -??10.40  Lambda:  1.46, previously (11/08/17) -??5.97  Free Kappa Lambda Ratio:  21.10, previously (11/08/17) -??1.74  Quantitative Immunoglobulins:  IgG:??  1480, previously (11/08/17) - 552  IgA:??  <26, previously (11/08/17) -??30  IgM:??  <20  ??  PROBLEM LIST: ??????????   ????  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 ??COPD  9.????Influenza A??(10/12/17)  ????  TREATMENT: ??????????   ??  1. Rad Tx to T5-7, T10-L3, Right Scapula - 3000 cGy - Dr. Jamas Lav 03/20/16-04/01/16  2. RVD x1 03/20/16 - discontinued d/t rash   3. Velcade/Pomalyst/Dex x3 cycles 04/23/16-06/17/16 - discontinued d/t reaction to pomalyst  4. Velcade/Dex x 1 cycle 06/26/16 (last dose of dexamethasone 07/22/16  5. High-dose melphalan followed by administration of PBSCs 2.36 x10^6 cd34cells/kg on 08/28/16  6. Maintenance Revlimid 35m daily (12/2016-04/23/17)  7. Dexamethasone 468mdaily (04/24/17)  8. DCEP   Cycle #1 - 04/26/17  Cycle #2 - 05/25/17 - excellent response  9.??Dara/Velcade/Dex (C1D1 - 06/22/17) - BMBx ??10/18/17 - CR  Cycles 7-8 (21 day cycle)  - Dex 20 mg po D4,5.8,9,11 and 12  - Dara 16 mg/Kg D1 only with Dex 20 mg IV  - Velcade 1.3 mg/M2 D1,4,8 and 11  Cycle 9 and beyond (maintenance, 11/08/17??- 01/03/18)  Dara 16 mg/Kg Q 28 days  Dex 12 mg IV D1  Velcade Q 2 weeks??  10.  DCEP   Cycle #1 - 01/13/18  ??  ASSESSMENT AND PLAN:??????????   ??  1. 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 Followed by maintenance Dara Q4 wks/Velcade Q2ks/Dex (started 11/08/17)  - Now w/ progressive disease based on myeloma labs (01/03/18) & PET scan (01/10/18), see above   - S/p 1 cycle DCEP     PLAN: Elo/Pom 2 mg/Dex (hold pred on dex days) (C1D1 02/18/18). Hope for allogeneic transplant in CR2, but needs to disease response and improvement in lung function   - M-spike (01/27/18) - 0.5 g/dL, previously 1.0 g/dL  - MMP 02/18/18: M-spike pending. SFLC ratio 4.95    Cycle 1 Day 6      2. ID: Admitted with fever  -  Fungitell and galactomannan (01/25/18) - Positive; Bronch / Diatherix (02/03/18) - parainfluenza virus, Acinetobacter baumanni, Adenovirus -- S/p Meropenem Day x 19 days  - Consulted ID: No need to cont abx for Acinetobacter PNA, but cont antifungal for tree in bud on CT chest  - Continue Valtrex ppx  - Cont Cresemba for fungal PNA   - Pan Cx 02/23/18: RUL pneumonia  - CXR 02/23/18:   - Zoysn Day + 1      3. Heme: Chemotherapy induced pancytopenia   - Transfuse for Hgb < 8 (severe COPD) and Platelets < 20K  - pRBC transfusion today    4. Metabolic:   - Cont KPhos 433 mg bid (started 02/04/18)  - Keep Mg > 2 and K+ > 4.0   - IVFs: 35m/hr    5. Pulmonary: Continue treatment for possible fungal PNA.     Imaging:  - Pulm Nodule: Stable on CT chest since 04/23/17; cont to monitor.   - CTPA (01/22/18): No PE, patchy groundglass opacities and tree-in-bud are seen throughout the lungs which are new compared to prior examination.   - CT chest (01/31/18): Stable innumerable pulmonary nodules bilaterally favoring metastatic disease.  - PFT (12/27/17): compared to 07/2016: FVC 3.25 L, 66%, and FEV1  1.42 L, 38%. FEV1/FVC is 44%. This is unchanged from prior study. Air trapping seen on lung volumes. Diffusion capacity 3.91, 79% of predicted,  down from 5.21     - Continue home inhalers   - Continue Steroid taper: Solumedrol 80 mg TID (started 01/22/18), 40 mg TID (01/25/18), 40 mg bid (01/26/18), decrease/change Pred 25 mg bid (02/03/18), 40 mg daily x 5 days, 20 mg daily x 5 days (begin 02/16/18), 10 mg daily x 5 days, 5 mg daily x 5 days, then stop (Patient has schedule for taper).   - Will be followed off prednisone receiving only the Decadron pulses as part of his chemotherapy once complete.   - Cont Spiriva and Breo  - Consult Dr. BDorann Lodge   6. GI/Nutrition: Appetite and oral intake is fair  - Continue PPI ppx w/ steroids   - Continue low microbial diet     Constipation: Improved  - Continue Dulcolax prn & Senokot-s 2 tabs daily     7. 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  - Echo (01/23/18): LVEF 50-55# w/ normal diastolic fxn & no obvious regional wall motion abnormalities  - Continue Lipitor   - Continue Diltiazem CD 240 mg daily, Amiodarone 200 mg daily & Lopressor 25 mg bid, hold 6/17 until BP increases  - S/p Celebrex (01/26/18 - 02/08/18)    8. Steroid Induced Hyperglycemia: Improving  - Continue high regimen Lispro SSI, AC&HS    9. Psych: Ongoing concerns about disease progression and ability to get transplant     Anxiety/Depression: Ongoing  - Continue Zoloft 50 mg daily   - Psych to follow as needed once outpt    10. Peripheral Neuropathy: Ongoing  - Continue Gabapentin 300 mg TID    11. Bone Health: No acute fx   - H/o spinal cord compression & diffuse lytic lesions t/o skeleton  - CT Chest (04/23/17) - multiple lytic lesions throughout the skeleton   - Continue Ca/Vit D & Zometa monthly (given 02/10/18)     12. Acute on Chronic left lower back pain d/t Neoplasm: Improved w/ chemotherapy  - S/p Radiation (09/01/17 - 09/15/17, FMaceo Pro  - Continue Fentanyl patch 50 mcg/hr (increased 01/13/18,  Rx 01/18/18)  - Continue Percocet 7.5/325 mg q4hrs prn    13. Steroid induced myopathy: Ongoing   - Fall (02/01/18)  - Continue PT/OT  @ home   - Continue steroid taper      DVT Prophylaxis:  SCD's; plts < 50K    Dispostion: Once afebrile.       The patient has been seen and examined by Dr. Michelle Piper, PA-C  Harlene Salts, MD  [facility]  Please contact me through [place]

## 2018-02-23 NOTE — Progress Notes (Signed)
Labels tubed to lab for second set of blood cultures. (Lab called and advised). David Watkins

## 2018-02-23 NOTE — Plan of Care (Addendum)
Problem: KNOWLEDGE DEFICIT  Goal: Patient/S.O. demonstrates understanding of disease process, treatment plan, medications, and discharge instructions.  Outcome: Ongoing  Note:   Patient's hemoglobin this AM: 6.8  Patient's platelet count this AM: 29  Pt seen and assessed at [facility].  Seen at [facility] today for 1 unit PRBC transfusion per standing orders for above lab values.  Blood products transfused per [facility] policy.  Pt tolerated transfusion well and without incident.  Pt verbalizes understanding of discharge instructions.  Pt transferred via wheelchair to [facility] per [facility] RN Heidi Mofo.

## 2018-02-23 NOTE — Progress Notes (Signed)
RESPIRATORY THERAPY ASSESSMENT    Name:  David Watkins  Medical Record Number:  [id number]  Age: 62 y.o.   Gender: male  DOB: 09-Jun-1956  Today's Date:  02/23/2018  Room:  3518/3518-01    Assessment     Is the patient being admitted for a COPD or Asthma exacerbation?  No   (If yes the patient will be seen every 4 hours for the first 24 hours and then reassessed)    Patient Admission Diagnosis:Relapse of multiple myeloma      Allergies  Allergies   Allergen Reactions   ??? Lenalidomide Rash   ??? Pomalidomide Hives   ??? Ceclor [Cefaclor] Hives       Pulmonary History:Former smoker  Home Oxygen Therapy:  room air  Home Respiratory Therapy:Albuterol, Tiotropium Bromide and Vilanterol/Fluticasone Furoate   Current Respiratory Therapy:  Albuterol, pulmicort, perforomist and spiriva          Respiratory Severity Index(RSI)   Patients with orders for inhalation medications, oxygen, or any therapeutic treatment modality will be placed on Respiratory Protocol.  They will be assessed with the first treatment and at least every 72 hours thereafter.  The following severity scale will be used to determine frequency of treatment intervention.    Smoking History: Pulmonary Disease or Smoking History, Greater than 15 pack year = 2    Social History  Social History     Tobacco Use   ??? Smoking status: Former Smoker     Last attempt to quit: 11/25/2012     Years since quitting: 5.2   ??? Smokeless tobacco: Never Used   Substance Use Topics   ??? Alcohol use: Yes     Comment: socially   ??? Drug use: No       Recent Surgical History: None = 0  Past Surgical History  Past Surgical History:   Procedure Laterality Date   ??? BONE MARROW BIOPSY     ??? BONE MARROW TRANSPLANT     ??? BRONCHOSCOPY N/A 02/03/2018    BRONCHOSCOPY WITH BAL, CHOICE ANESTHESIA performed by [name], MD at [facility] ENDOSCOPY   ??? BRONCHOSCOPY  02/03/2018    BRONCHOSCOPY THERAPUTIC ASPIRATION SUBSEQUENT performed by [name], MD at [facility] ENDOSCOPY   ??? OTHER SURGICAL HISTORY Left  08/17/2016    trifusion cath placement   ??? PRE-MALIGNANT / BENIGN SKIN LESION EXCISION Left 03/29/2017    EXCISE LESION LEFT EXTERNAL EAR WITH FROZEN SECTION, FULL THICKNESS SKIN GRAFT       Level of Consciousness: Alert, Oriented, and Cooperative = 0    Level of Activity: Walking unassisted = 0    Respiratory Pattern: Regular Pattern; RR 8-20 = 0    Breath Sounds: Diminshed bilaterally and/or crackles = 2    Sputum   ,  ,    Cough: Strong, spontaneous, non-productive = 0    Vital Signs   BP 111/75    Pulse 89    Temp 99.9 ??F (37.7 ??C) (Oral)    Resp 22    Wt 187 lb (84.8 kg)    SpO2 93%    BMI 27.62 kg/m??   SPO2 (COPD values may differ): Greater than or equal to 92% on room air = 0    Peak Flow (asthma only): not applicable = 0    RSI: 0-4 = See once and convert to home regimen or discontinue        Plan       Goals: Medication delivery  Patient/caregiver was educated on the proper method of use for Respiratory Care Devices:  Yes      Level of patient/caregiver understanding able to:   x Verbalize understanding   ? Demonstrate understanding       ? Teach back        ? Needs reinforcement       ?  No available caregiver               ?  Other:     Response to education:  Very Good     Is patient being placed on Home Treatment Regimen?  Yes     Does the patient have everything they need prior to discharge?  Yes     Comments: Chart reviewed    Plan of Care: Albuterol, pulmicort, perforomist and spiriva    Electronically signed by Mackey Birchwood, RCP on 02/23/2018 at 3:29 PM    Respiratory Protocol Guidelines     1. Assessment and treatment by Respiratory Therapy will be initiated for medication and therapeutic interventions upon initiation of aerosolized medication.  2. Physician will be contacted for respiratory rate (RR) greater than 35 breaths per minute. Therapy will be held for heart rate (HR) greater than 140 beats per minute, pending direction from physician.  3. Bronchodilators will be administered via  Metered Dose Inhaler (MDI) with spacer when the following criteria are met:  a. Alert and cooperative     b. HR < 140 bpm  c. RR < 30 bpm                d. Can demonstrate a 2-3 second inspiratory hold  4. Bronchodilators will be administered via Hand Held Nebulizer [facility]) to patients when ANY of the following criteria are met  a. Incognizant or uncooperative          b. Patients treated with HHN at Home        c. Unable to demonstrate proper use of MDI with spacer     d. RR > 30 bpm   5. Bronchodilators will be delivered via Metered Dose Inhaler (MDI), HHN, Aerogen to intubated patients on mechanical ventilation.  6. Inhalation medication orders will be delivered and/or substituted as outlined below.    Aerosolized Medications Ordering and Administration Guidelines:    1. All Medications will be ordered by a physician, and their frequency and/or modality will be adjusted as defined by the patients Respiratory Severity Index (RSI) score.  2. If the patient does not have documented COPD, consider discontinuing anticholinergics when RSI is less than 9.  3. If the bronchospasm worsens (increased RSI), then the bronchodilator frequency can be increased to a maximum of every 4 hours.  If greater than every 4 hours is required, the physician will be contacted.  4. If the bronchospasm improves, the frequency of the bronchodilator can be decreased, based on the patient's RSI, but not less than home treatment regimen frequency.  5. Bronchodilator(s) will be discontinued if patient has a RSI less than 9 and has received no scheduled or as needed treatment for 72  Hrs.    Patients Ordered on a Mucolytic Agent:    1. Must always be administered with a bronchodilator.    2. Discontinue if patient experiences worsened bronchospasm, or secretions have lessened to the point that the patient is able to clear them with a cough.    Anti-inflammatory and Combination Medications:    1. If the patient lacks prior history of lung disease,  is not using inhaled anti-inflammatory medication at home, and lacks wheezing by examination or by history for at least 24 hours, contact physician for possible discontinuation.

## 2018-02-24 LAB — CBC WITH AUTO DIFFERENTIAL
Basophils %: 0.2 %
Basophils Absolute: 0 10*3/uL (ref 0.0–0.2)
Eosinophils %: 0.2 %
Eosinophils Absolute: 0 10*3/uL (ref 0.0–0.6)
Hematocrit: 19.8 % — CL (ref 40.5–52.5)
Hemoglobin: 6.7 g/dL — CL (ref 13.5–17.5)
Lymphocytes %: 3 %
Lymphocytes Absolute: 0.2 10*3/uL — ABNORMAL LOW (ref 1.0–5.1)
MCH: 30.2 pg (ref 26.0–34.0)
MCHC: 33.8 g/dL (ref 31.0–36.0)
MCV: 89.4 fL (ref 80.0–100.0)
MPV: 8.9 fL (ref 5.0–10.5)
Monocytes %: 4.3 %
Monocytes Absolute: 0.2 10*3/uL (ref 0.0–1.3)
Neutrophils %: 92.3 %
Neutrophils Absolute: 5.1 10*3/uL (ref 1.7–7.7)
Platelets: 25 10*3/uL — ABNORMAL LOW (ref 135–450)
RBC: 2.21 M/uL — ABNORMAL LOW (ref 4.20–5.90)
RDW: 20.1 % — ABNORMAL HIGH (ref 12.4–15.4)
WBC: 5.5 10*3/uL (ref 4.0–11.0)

## 2018-02-24 LAB — APTT: aPTT: 28.2 s (ref 26.0–36.0)

## 2018-02-24 LAB — PROTIME-INR
INR: 1.2 — ABNORMAL HIGH (ref 0.86–1.14)
Protime: 13.7 s — ABNORMAL HIGH (ref 9.8–13.0)

## 2018-02-24 LAB — BASIC METABOLIC PANEL
Anion Gap: 13 (ref 3–16)
BUN: 11 mg/dL (ref 7–20)
CO2: 23 mmol/L (ref 21–32)
Calcium: 8.7 mg/dL (ref 8.3–10.6)
Chloride: 97 mmol/L — ABNORMAL LOW (ref 99–110)
Creatinine: 0.8 mg/dL (ref 0.8–1.3)
GFR African American: >60 (ref >60)
GFR Non-African American: >60 (ref >60)
Glucose: 131 mg/dL — ABNORMAL HIGH (ref 70–99)
Potassium: 3.8 mmol/L (ref 3.5–5.1)
Sodium: 133 mmol/L — ABNORMAL LOW (ref 136–145)

## 2018-02-24 LAB — MAGNESIUM: Magnesium: 1.6 mg/dL — ABNORMAL LOW (ref 1.80–2.40)

## 2018-02-24 LAB — CULTURE, URINE: Urine Culture, Routine: NO GROWTH

## 2018-02-24 MED ORDER — ACETAMINOPHEN 325 MG PO TABS
325 MG | ORAL | Status: DC | PRN
Start: 2018-02-24 — End: 2018-02-28

## 2018-02-24 MED ORDER — SODIUM CHLORIDE 0.9 % IV BOLUS
0.9 % | Freq: Once | INTRAVENOUS | Status: DC
Start: 2018-02-24 — End: 2018-02-25

## 2018-02-24 MED FILL — POMALYST 2 MG PO CAPS: 2 mg | ORAL | Qty: 1

## 2018-02-24 MED FILL — PERFOROMIST 20 MCG/2ML IN NEBU: 20 MCG/2ML | RESPIRATORY_TRACT | Qty: 2

## 2018-02-24 MED FILL — GABAPENTIN 300 MG PO CAPS: 300 mg | ORAL | Qty: 1

## 2018-02-24 MED FILL — BUDESONIDE 0.5 MG/2ML IN SUSP: 0.5 MG/2ML | RESPIRATORY_TRACT | Qty: 2

## 2018-02-24 MED FILL — SPIRIVA HANDIHALER 18 MCG IN CAPS: 18 ug | RESPIRATORY_TRACT | Qty: 5

## 2018-02-24 MED FILL — OXYCODONE-ACETAMINOPHEN 7.5-325 MG PO TABS: 7.5-325 mg | ORAL | Qty: 1

## 2018-02-24 MED FILL — PIPERACILLIN SOD-TAZOBACTAM SO 4.5 (4-0.5) G IV SOLR: 4.5 (4-0.5) g | INTRAVENOUS | Qty: 4.5

## 2018-02-24 MED FILL — DILTIAZEM HCL ER COATED BEADS 240 MG PO CP24: 240 mg | ORAL | Qty: 1

## 2018-02-24 MED FILL — PHOSPHA 250 NEUTRAL 155-852-130 MG PO TABS: 155-852-130 mg | ORAL | Qty: 1

## 2018-02-24 MED FILL — VALACYCLOVIR HCL 500 MG PO TABS: 500 mg | ORAL | Qty: 1

## 2018-02-24 MED FILL — SODIUM CHLORIDE 0.9 % IV SOLN: 0.9 % | INTRAVENOUS | Qty: 250

## 2018-02-24 MED FILL — SENNOSIDES-DOCUSATE SODIUM 8.6-50 MG PO TABS: 8.6-50 mg | ORAL | Qty: 2

## 2018-02-24 MED FILL — PREDNISONE 5 MG PO TABS: 5 mg | ORAL | Qty: 1

## 2018-02-24 MED FILL — CRESEMBA 186 MG PO CAPS: 186 mg | ORAL | Qty: 2

## 2018-02-24 MED FILL — LIPITOR 20 MG PO TABS: 20 mg | ORAL | Qty: 1

## 2018-02-24 MED FILL — OYSTER SHELL CALCIUM 500 MG PO TABS: 500 mg | ORAL | Qty: 1

## 2018-02-24 MED FILL — MUCINEX 600 MG PO TB12: 600 mg | ORAL | Qty: 1

## 2018-02-24 MED FILL — SODIUM CHLORIDE 0.9 % IV SOLN: 0.9 % | INTRAVENOUS | Qty: 1000

## 2018-02-24 MED FILL — SERTRALINE HCL 50 MG PO TABS: 50 mg | ORAL | Qty: 1

## 2018-02-24 MED FILL — PANTOPRAZOLE SODIUM 40 MG PO TBEC: 40 mg | ORAL | Qty: 1

## 2018-02-24 MED FILL — AMIODARONE HCL 200 MG PO TABS: 200 mg | ORAL | Qty: 1

## 2018-02-24 NOTE — Progress Notes (Signed)
Patient's home medication Pomalyst obtained, verified with pharmacy, properly labeled, and placed in pt's cart meds.     Electronically signed by Kennis Carina, RN on 02/24/2018 at 4:33 AM

## 2018-02-24 NOTE — Plan of Care (Signed)
Nutrition Problem: Increased nutrient needs  Intervention: Food and/or Nutrient Delivery: Continue current diet, Start ONS(smoothie)  Nutritional Goals: Pt will consume and tolerate at least 50% of meals/supplements offered

## 2018-02-24 NOTE — Progress Notes (Signed)
CC: Pneumonia    Tired this morning, and a little short of breath.  Has not yet had morning bronchodilator therapy.  Cough largely nonproductive.  Low-grade fever at midnight, afebrile since that  WBC 5.5, hemoglobin 6.7 g, platelets 25, stable  Empiric antibiotic therapy with ampicillin-tazobactam.  Prednisone dose 5 mg daily.  BP 103/55.  NSR.  Heart sounds distant  O2 saturation 92% on room air.  Breath sounds mildly reduced, otherwise normal.  No adventitious sounds.  Abdomen soft, no tenderness.  No peripheral edema.  Fluid balance neutral  Creatinine 0.8.  Sodium 133, potassium 3.8, chloride 97, CO2 23.  Glucose 131    A&P: Right upper lobe pneumonia, occurring in immunocompromised host.  He has minimal symptoms associated with this.  Current antibiotic coverage appears appropriate.  Severe COPD has remained stable.  No evidence of decompensation with current pneumonia.  Maintain ICS/LABA/LAMA  No hypoxemia associated with current illness.

## 2018-02-24 NOTE — Progress Notes (Addendum)
Nutrition Assessment    Type and Reason for Visit: Initial, Positive Nutrition Screen    Nutrition Recommendations:   1. PO Diet: Continue current diet   2. Pt ok to request smoothies any time as desired  3. Monitor weight, labs and clinical progress  4. Monitor, record and encourage adequate PO intake at all meals through admission especially protein  5. Consume small/frequent meals and snacks to build appetite  6. Encourage good oral care prior to meals to manage taste changes      Nutrition Assessment: Positive screen for poor appetite. Pt is moderately malnourished on admit AEB mild loss of subcutaneous fat and moderate loss of muscle mass. Pt is at risk for further compromise d/t increased nutrient needs for chemo. RD encouraged adequate protein intake. Pt reports lack of appetite for ~1 month and c/o ongoing taste changes from chemo. Pt states he uses plastic utensils if metallic taste present. Recommended good oral care prior to meals to manage taste changes. Pt reports he could only tolerate a few foods such as hot dogs, beans, mac & cheese and was drinking daily smoothies with fruit, yogurt, and protein powder at home. Pt interested in only fruit smoothies with [place] yogurt and milk at this time and dislikes all ONS. Will start smoothies per pt preference, monitor ability to improve po intake and nutrition status.     Malnutrition Assessment:   Malnutrition Status: Meets the criteria for moderate malnutrition   Context: Chronic illness   Findings of the 6 clinical characteristics of malnutrition (Minimum of 2 out of 6 clinical characteristics is required to make the diagnosis of moderate or severe Protein Calorie Malnutrition based on AND/ASPEN Guidelines):  1. Energy Intake-Less than or equal to 50% of estimated energy requirement, (since admit x 1 day)    2. Weight Loss-No significant weight loss(per EMR),    3. Fat Loss-Mild subcutaneous fat loss, Triceps  4. Muscle Loss-Moderate muscle mass loss,  Thigh (quadriceps), Calf (gastrocnemius)  5. Fluid Accumulation-No significant fluid accumulation,    6. Grip Strength-Not measured    Nutrition Risk Level: Moderate    Nutrient Needs:   Estimated Daily Total Kcal: 2363-2701 (28-32)   Estimated Daily Protein (g): 101-118 (1.2-1.4)   Estimated Daily Total Fluid (ml/day): 6578-4696 (1 ml/kcal)    Nutrition Diagnosis:    Problem: Increased nutrient needs   Etiology: related to Increased demand for energy/nutrients    . Signs and symptoms:  as evidenced by Other (Comment)(chemo)    Objective Information:   Nutrition-Focused Physical Findings: fatigue; no edema; last BM x 1 on 6/26   Wound Type: None   Current Nutrition Therapies:   Oral Diet Orders: General, Low Microbial    Oral Diet intake: Unable to assess(no po intake recorded since admit and breakfast tray untouched this morning)   Oral Nutrition Supplement (ONS) Orders: None   Anthropometric Measures:   Ht: 5\' 8"  (172.7 cm)    Current Body Wt: 186 lb (84.4 kg)   Admission Body Wt: 187 lb (84.8 kg)(stated)   Usual Body Wt: (180-195 lb per EMR and 185 lb per pt)   Ideal Body Wt: 154 lb (69.9 kg),    BMI Classification: BMI 25.0 - 29.9 Overweight    Nutrition Interventions:   Continue current diet, Start ONS(smoothie)  Continued Inpatient Monitoring    Nutrition Evaluation:    Evaluation: Goals set    Goals: Pt will consume and tolerate at least 50% of meals/supplements offered     Monitoring:  Meal Intake, Supplement Intake, Diet Tolerance, Weight, Pertinent Labs      Electronically signed by Dorcas Mcmurray, RD, LD on 02/24/18 at 12:34 PM    Contact Number: [phone]

## 2018-02-24 NOTE — Progress Notes (Addendum)
Progress Note    02/24/2018     David Watkins    MRN: [id number]    DOB: 1955-12-05      Patient Active Problem List   Diagnosis   . Multiple myeloma not having achieved remission (HCC)   . COPD, severe (HCC)   . Multiple myeloma in remission (HCC)   . Moderate malnutrition (HCC)   . Open wound of left ear   . Ear lesion   . Skin lesion   . 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 Multiple myeloma and immunoproliferative neoplasms (HCC)   . COPD exacerbation (HCC)   . Pulmonary nodule   . Pneumonia   . Acute respiratory failure with hypoxia and hypercapnia (HCC)   . Neutropenia associated with infection (HCC)   . Paroxysmal atrial fibrillation (HCC)   . Mixed hyperlipidemia   . Acute pericarditis   . Atypical pneumonia   . Multiple myeloma in relapse [facility])         SUBJECTIVE:  Patient is doing welI.  Admitted with fever but afeb overnight.  Productive cough    ECOG PS:(2) Ambulatory and capable of self care, unable to carry out work activity, up and about > 50% or waking hours    Intravenous Access: Port  IV therapy site and patency: subclavian right, condition patent and no redness    Isolation: None    Medications    Scheduled Meds:  . sodium chloride  250 mL Intravenous Once   . amiodarone  200 mg Oral Daily   . atorvastatin  10 mg Oral Nightly   . calcium elemental  500 mg Oral BID   . diltiazem  240 mg Oral Daily   . gabapentin  300 mg Oral TID   . guaiFENesin  600 mg Oral BID   . Isavuconazonium Sulfate  372 mg Oral Daily   . pantoprazole  40 mg Oral QAM AC   . phosphorus  1 tablet Oral BID   . pomalidomide  2 mg Oral Daily   . predniSONE  5 mg Oral Daily   . sennosides-docusate sodium  2 tablet Oral Daily   . sertraline  50 mg Oral Daily   . tiotropium  18 mcg Inhalation Daily   . valACYclovir  500 mg Oral BID   . piperacillin-tazobactam  4.5 g Intravenous Q6H   . sodium chloride flush  10 mL Intravenous 2 times per day   . Saline Mouthwash  15 mL Swish & Spit 4x Daily AC & HS   . budesonide   0.5 mg Nebulization BID    And   . formoterol  20 mcg Nebulization BID     Continuous Infusions:  . sodium chloride 50 mL/hr at 02/23/18 1501   . sodium chloride       PRN Meds:.albuterol, bisacodyl, diphenhydrAMINE, lidocaine-prilocaine, ondansetron, oxyCODONE-acetaminophen, prochlorperazine, sodium chloride, sodium chloride flush, potassium chloride, magnesium sulfate, magnesium hydroxide, Saline Mouthwash, alteplase, acetaminophen      ROS:  As in Subjective otherwise negative ten point review of systems.    Physical    Patient Vitals for the past 24 hrs:   BP Temp Temp src Pulse Resp SpO2 Height Weight   02/24/18 0904 -- -- -- -- -- 93 % -- --   02/24/18 0832 -- -- -- -- -- -- -- 186 lb (84.4 kg)   02/24/18 0811 106/68 98.8 F (37.1 C) Oral 89 18 92 % -- --   02/24/18 0316 94/68 99.1  F (37.3 C) Oral 85 18 91 % -- --   02/24/18 0017 121/68 100 F (37.8 C) Oral 90 20 93 % -- --   02/23/18 2053 105/65 -- -- -- -- -- -- --   02/23/18 2048 95/67 98.7 F (37.1 C) Oral 84 20 95 % -- --   02/23/18 1242 111/75 99.9 F (37.7 C) Oral 89 22 93 % 5\' 8"  (1.727 m) 187 lb (84.8 kg)         Intake/Output Summary (Last 24 hours) at 02/24/2018 0958  Last data filed at 02/24/2018 0636  Gross per 24 hour   Intake 1223 ml   Output 1500 ml   Net -277 ml       Well developed, well nourished in no acute distress    Skin: Warm, dry no active rash    HEENT:  PERRL, Conjunctiva without erythema or icterus, oral mucosa moist and intact.    Neck:  Supple without adenopathy    Catheter:  See Above    Cor:  RRR S1,S2, OK.  No murmur rub or gallop.    Lungs:  Unlabored breathing.  Clear to A & P  Diminished  BS throughout    Abdomen:  Soft, nontender, nondistended, normoactive BS, no hepatosplenomegaly or palpable masses.  No rebound or guarding.    Pelvic/GU:  No nodes, foley, flexiseal not present    Extremities:  No edema, cyanosis or clubbing    Neuro:  Awake, alert, well oriented.  CN II-XII intact, no gross motor or sensory  deficits.    Data    CBC:   Recent Labs     02/23/18  1430 02/24/18  0311   WBC 7.8 5.5   HGB 7.1* 6.7*   HCT 21.0* 19.8*   MCV 89.1 89.4   PLT 24* 25*     BMP/Mag:  Recent Labs     02/23/18  1430 02/24/18  0311   NA 130* 133*   K 4.0 3.8   CL 94* 97*   CO2 24 23   PHOS 3.8  --    BUN 12 11   CREATININE 0.7* 0.8   MG 1.60* 1.60*     LIVP:   Recent Labs     02/23/18  1430   AST 12*   ALT 27   BILIDIR <0.2   BILITOT 0.4   ALKPHOS 127     Coags:   Recent Labs     02/23/18  1430 02/24/18  0311   PROTIME 14.8* 13.7*   INR 1.30* 1.20*   APTT 27.7 28.2                ASSESSMENT AND PLAN     Patient Active Problem List   Diagnosis   . Multiple myeloma not having achieved remission (HCC)   . COPD, severe (HCC)   . Multiple myeloma in remission (HCC)   . Moderate malnutrition (HCC)   . Open wound of left ear   . Ear lesion   . Skin lesion   . 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 Multiple myeloma and immunoproliferative neoplasms (HCC)   . COPD exacerbation (HCC)   . Pulmonary nodule   . Pneumonia   . Acute respiratory failure with hypoxia and hypercapnia (HCC)   . Neutropenia associated with infection (HCC)   . Paroxysmal atrial fibrillation (HCC)   . Mixed hyperlipidemia   . Acute pericarditis   . Atypical pneumonia   . Multiple  myeloma in relapse (HCC)     1. 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 Followed by maintenance Dara Q4 wks/Velcade Q2ks/Dex (started 11/08/17)  - Now w/ progressive disease based on myeloma labs (01/03/18) & PET scan (01/10/18), see above   - S/p 1 cycle DCEP     PLAN: Elo/Pom 2 mg/Dex (hold pred on dex days) (C1D1 02/18/18). Hope for allogeneic transplant in CR2, but needs to disease response and improvement in lung function   - M-spike (01/27/18) - 0.5 g/dL, previously 1.0 g/dL  - MMP 1/47/82: M-spike pending. SFLC ratio 4.95    Cycle 1 Day 7      2. ID: Admitted with fever  - Fungitell and  galactomannan (01/25/18) - Positive; Bronch / Diatherix (02/03/18) - parainfluenza virus, Acinetobacter baumanni, Adenovirus -- S/p Meropenem Day x 19 days  - Consulted ID: No need to cont abx for Acinetobacter PNA, but cont antifungal for tree in bud on CT chest  - Continue Valtrex ppx  - Cont Cresemba for fungal PNA   - Pan Cx 02/23/18: RUL pneumonia  - CXR 02/23/18:   - Zoysn Day + 2      3. Heme: Chemotherapy induced anemia and thrombocytopenia   - Transfuse for Hgb < 8 (severe COPD) and Platelets < 20K  - PRBC transfusion today    4. Metabolic:   - Cont KPhos 500 mg bid (started 02/04/18)  - Keep Mg > 2 and K+ > 4.0   - IVFs: 57ml/hr    5. Pulmonary: Continue treatment for possible fungal PNA.     Imaging:  - Pulm Nodule: Stable on CT chest since 04/23/17; cont to monitor.   - CTPA (01/22/18): No PE, patchy groundglass opacities and tree-in-bud are seen throughout the lungs which are new compared to prior examination.   - CT chest (01/31/18): Stable innumerable pulmonary nodules bilaterally favoring metastatic disease.  - PFT (12/27/17): compared to 07/2016: FVC 3.25 L, 66%, and FEV1  1.42 L, 38%. FEV1/FVC is 44%. This is unchanged from prior study. Air trapping seen on lung volumes. Diffusion capacity 3.91, 79% of predicted, down from 5.21     - Continue home inhalers   - Continue Steroid taper: Solumedrol 80 mg TID (started 01/22/18), 40 mg TID (01/25/18), 40 mg bid (01/26/18), decrease/change Pred 25 mg bid (02/03/18), 40 mg daily x 5 days, 20 mg daily x 5 days (begin 02/16/18), 10 mg daily x 5 days, 5 mg daily x 5 days, then stop (Patient has schedule for taper).   - Will be followed off prednisone receiving only the Decadron pulses as part of his chemotherapy once complete.   - Cont Spiriva and Breo  - Consult Dr. Karie Kirks    6. GI/Nutrition: Appetite and oral intake is fair  - Continue PPI ppx w/ steroids   - Continue low microbial diet     Constipation: Improved  - Continue Dulcolax prn & Senokot-s 2 tabs daily     7.  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  - Echo (01/23/18): LVEF 50-55# w/ normal diastolic fxn & no obvious regional wall motion abnormalities  - Continue Lipitor   - Continue Diltiazem CD 240 mg daily, Amiodarone 200 mg daily & Lopressor 25 mg bid, hold 6/17 until BP increases  - S/p Celebrex (01/26/18 - 02/08/18)    8. Steroid Induced Hyperglycemia: Improving  -  Continue high regimen Lispro SSI, AC&HS    9. Psych: Ongoing concerns about disease progression and ability to get transplant     Anxiety/Depression: Ongoing  - Continue Zoloft 50 mg daily   - Psych to follow as needed once outpt    10. Peripheral Neuropathy: Ongoing  - Continue Gabapentin 300 mg TID    11. Bone Health: No acute fx   - H/o spinal cord compression & diffuse lytic lesions t/o skeleton  - CT Chest (04/23/17) - multiple lytic lesions throughout the skeleton   - Continue Ca/Vit D & Zometa monthly (given 02/10/18)     12. 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 Steroid induced myopathy: Ongoing   - Fall (02/01/18)  - Continue PT/OT @ home   - Continue steroid taper will complete on 6/30      DVT Prophylaxis:  SCD's; plts < 50K    Dispostion: Once afebrile.       Lisette Grinder, MD

## 2018-02-24 NOTE — Plan of Care (Signed)
Problem: Bleeding:  Goal: Will show no signs and symptoms of excessive bleeding  Description  Will show no signs and symptoms of excessive bleeding  02/24/2018 0557 by Braxton Feathers, RN  Outcome: Ongoing  Note:   Patient's hemoglobin this AM:   Recent Labs     02/24/18  0311   HGB 6.7*     Patient's platelet count this AM:   Recent Labs     02/24/18  0311   PLT 25*    Thrombocytopenia Precautions in place.  Patient showing no signs or symptoms of active bleeding.  Patient transfused blood products per orders - see flowsheet.  Patient verbalizes understanding of all instructions. Will continue to assess and implement POC. Call light within reach and hourly rounding in place.          Problem: Infection - Central Venous Catheter-Associated Bloodstream Infection:  Goal: Will show no infection signs and symptoms  Description  Will show no infection signs and symptoms  02/24/2018 0557 by Braxton Feathers, RN  Outcome: Ongoing  Note:   CVC site remains free of signs/symptoms of infection. No drainage, edema, erythema, pain, or warmth noted at site. Dressing changes continue per protocol and on an as needed basis - see flowsheet.      Problem: Falls - Risk of:  Goal: Will remain free from falls  Description  Will remain free from falls  02/24/2018 0557 by Braxton Feathers, RN  Outcome: Ongoing  Note:   + High Fall Risk per MORSE/ABCDS: Explained fall risk precautions to pt and family and rationale behind their use to keep the patient safe. Pt bed is in low position, side rails up, call light and belongings are in reach. Fall wristband applied and present on pts wrist.  Bed alarm on.  Pt encouraged to call for assistance. Will continue with hourly rounds for PO intake, pain needs, toileting and repositioning as needed.          Problem: Pain:  Goal: Pain level will decrease  Description  Pain level will decrease  Outcome: Ongoing  Note:   Patient complains of pain at level 7/10. Patient describes pain as chronic back  pain. Patient requests pain medication. Patient medicated with PRN percocet, see MAR, and repositioned in the bed independently. Pt resting upon reassessment. Will continue to monitor.        Problem: Pain:  Goal: Control of chronic pain  Description  Control of chronic pain  Outcome: Ongoing

## 2018-02-24 NOTE — Care Coordination-Inpatient (Signed)
Initial Social Work Note    Patient was admitted to the unit yesterday for fever.  He does have pneumonia.  He was diagnosed with MM in July of last year.  He has relapsed disease and now plasma cell leukemia.  Patient is known to this Probation officer from other admissions to the unit.  Patient was up walking with staff today.  He was slow but able to move with RW and staff.  Dr. Dorann Lodge is following for SOB.    Patient is active with [facility] for home therapy.  He lives in [place], [place] and has assistance from family and SO.     Plan of care: ??SW will monitor for discharge and psychosocial needs  ??  Estimated discharge date: unsure at this time  Disposition: d/c to SO's home/his home  Advanced Directives in chart? Yes 08/26/2017  Patient/family aware of discharge plans? Yes  ??  Freddi Starr, MSW, [place]

## 2018-02-24 NOTE — Plan of Care (Signed)
Problem: Bleeding:  Goal: Will show no signs and symptoms of excessive bleeding  Description  Will show no signs and symptoms of excessive bleeding  02/24/2018 1423 by Garlon Hatchet, RN  Outcome: Ongoing  Note:   Patient's hemoglobin this AM:   Recent Labs     02/24/18  0311   HGB 6.7*     Patient's platelet count this AM:   Recent Labs     02/24/18  0311   PLT 25*    Thrombocytopenia not present at this time.  Patient showing no signs or symptoms of active bleeding.  Patient transfused blood products per orders - see flowsheet.  Patient verbalizes understanding of all instructions. Will continue to assess and implement POC. Call light within reach and hourly rounding in place.         Problem: Infection - Central Venous Catheter-Associated Bloodstream Infection:  Goal: Will show no infection signs and symptoms  Description  Will show no infection signs and symptoms  02/24/2018 0557 by Kennis Carina, RN  Outcome: Ongoing  Note:   CVC site remains free of signs/symptoms of infection. No drainage, edema, erythema, pain, or warmth noted at site. Dressing changes continue per protocol and on an as needed basis - see flowsheet.      Problem: Falls - Risk of:  Goal: Will remain free from falls  Description  Will remain free from falls  02/24/2018 1423 by Garlon Hatchet, RN  Outcome: Ongoing  Note:   Pt is a High fall risk. See Leamon Arnt Fall Score and ABCDS Injury Risk assessments.   + High Fall Risk per MORSE/ABCDS: Explained fall risk precautions to pt and family and rationale behind their use to keep the patient safe. Pt bed is in low position, side rails up, call light and belongings are in reach. Fall wristband applied and present on pts wrist.  Bed alarm on.  Pt encouraged to call for assistance. Will continue with hourly rounds for PO intake, pain needs, toileting and repositioning as needed.        Problem: Pain:  Goal: Pain level will decrease  Description  Pain level will decrease  02/24/2018 1423 by Garlon Hatchet,  RN  Outcome: Ongoing  Note:   Patient complains of chronic back pain, pt requests pain medication. Patient medicated with PRN percocet, see MAR, and repositioned in the bed independently. Pt resting upon reassessment. Will continue to monitor.

## 2018-02-25 LAB — HEPATIC FUNCTION PANEL
ALT: 21 U/L (ref 10–40)
AST: 11 U/L — ABNORMAL LOW (ref 15–37)
Albumin: 2.7 g/dL — ABNORMAL LOW (ref 3.4–5.0)
Alkaline Phosphatase: 109 U/L (ref 40–129)
Bilirubin, Direct: <0.2 mg/dL (ref 0.0–0.3)
Total Bilirubin: 0.3 mg/dL (ref 0.0–1.0)
Total Protein: 6.4 g/dL (ref 6.4–8.2)

## 2018-02-25 LAB — BASIC METABOLIC PANEL
Anion Gap: 11 (ref 3–16)
BUN: 6 mg/dL — ABNORMAL LOW (ref 7–20)
CO2: 23 mmol/L (ref 21–32)
Calcium: 8.3 mg/dL (ref 8.3–10.6)
Chloride: 100 mmol/L (ref 99–110)
Creatinine: 0.7 mg/dL — ABNORMAL LOW (ref 0.8–1.3)
GFR African American: >60 (ref >60)
GFR Non-African American: >60 (ref >60)
Glucose: 115 mg/dL — ABNORMAL HIGH (ref 70–99)
Potassium: 3.5 mmol/L (ref 3.5–5.1)
Sodium: 134 mmol/L — ABNORMAL LOW (ref 136–145)

## 2018-02-25 LAB — CBC WITH AUTO DIFFERENTIAL
Basophils %: 0.1 %
Basophils Absolute: 0 10*3/uL (ref 0.0–0.2)
Eosinophils %: 0.3 %
Eosinophils Absolute: 0 10*3/uL (ref 0.0–0.6)
Hematocrit: 21.7 % — ABNORMAL LOW (ref 40.5–52.5)
Hemoglobin: 7.4 g/dL — ABNORMAL LOW (ref 13.5–17.5)
Lymphocytes %: 3.2 %
Lymphocytes Absolute: 0.2 10*3/uL — ABNORMAL LOW (ref 1.0–5.1)
MCH: 30.3 pg (ref 26.0–34.0)
MCHC: 34 g/dL (ref 31.0–36.0)
MCV: 88.9 fL (ref 80.0–100.0)
MPV: 8.5 fL (ref 5.0–10.5)
Monocytes %: 6.4 %
Monocytes Absolute: 0.3 10*3/uL (ref 0.0–1.3)
Neutrophils %: 90 %
Neutrophils Absolute: 4.4 10*3/uL (ref 1.7–7.7)
Platelets: 21 10*3/uL — ABNORMAL LOW (ref 135–450)
RBC: 2.44 M/uL — ABNORMAL LOW (ref 4.20–5.90)
RDW: 18.7 % — ABNORMAL HIGH (ref 12.4–15.4)
WBC: 4.9 10*3/uL (ref 4.0–11.0)

## 2018-02-25 LAB — PREPARE RBC (CROSSMATCH)
Dispense Status Blood Bank: TRANSFUSED
Dispense Status Blood Bank: TRANSFUSED

## 2018-02-25 LAB — PHOSPHORUS: Phosphorus: 3.5 mg/dL (ref 2.5–4.9)

## 2018-02-25 LAB — URIC ACID: Uric Acid: 1.2 mg/dL — ABNORMAL LOW (ref 3.5–7.2)

## 2018-02-25 LAB — LACTATE DEHYDROGENASE: LD: 217 U/L — ABNORMAL HIGH (ref 100–190)

## 2018-02-25 LAB — CULTURE, THROAT: Throat Culture: NORMAL

## 2018-02-25 MED ORDER — POTASSIUM CHLORIDE 20 MEQ/50ML IV SOLN
20 MEQ/50ML | INTRAVENOUS | Status: DC | PRN
Start: 2018-02-25 — End: 2018-02-28
  Administered 2018-02-25 – 2018-02-28 (×4): 20 meq via INTRAVENOUS

## 2018-02-25 MED ORDER — DM-GUAIFENESIN ER 30-600 MG PO TB12
30-600 MG | Freq: Two times a day (BID) | ORAL | Status: DC
Start: 2018-02-25 — End: 2018-02-28
  Administered 2018-02-26 – 2018-02-28 (×6): 2 via ORAL

## 2018-02-25 MED ORDER — BENZONATATE 100 MG PO CAPS
100 MG | Freq: Three times a day (TID) | ORAL | Status: DC | PRN
Start: 2018-02-25 — End: 2018-02-28
  Administered 2018-02-25 – 2018-02-28 (×5): 100 mg via ORAL

## 2018-02-25 MED ORDER — MAGNESIUM SULFATE 2000 MG/50 ML IVPB PREMIX
250 GM/50ML | INTRAVENOUS | Status: DC | PRN
Start: 2018-02-25 — End: 2018-02-28
  Administered 2018-02-28: 10:00:00 2 g via INTRAVENOUS

## 2018-02-25 MED ORDER — FENTANYL 50 MCG/HR TD PT72
50 MCG/HR | TRANSDERMAL | Status: DC
Start: 2018-02-25 — End: 2018-02-28
  Administered 2018-02-25 – 2018-02-28 (×2): 1 via TRANSDERMAL

## 2018-02-25 MED ORDER — HYDROCODONE-HOMATROPINE 5-1.5 MG/5ML PO SYRP
ORAL | Status: DC | PRN
Start: 2018-02-25 — End: 2018-02-28
  Administered 2018-02-25 – 2018-02-28 (×8): 5 mL via ORAL

## 2018-02-25 MED FILL — HYDROCODONE-HOMATROPINE 5-1.5 MG/5ML PO SYRP: 5-1.5 MG/5ML | ORAL | Qty: 5

## 2018-02-25 MED FILL — PIPERACILLIN SOD-TAZOBACTAM SO 4.5 (4-0.5) G IV SOLR: 4.5 (4-0.5) g | INTRAVENOUS | Qty: 4.5

## 2018-02-25 MED FILL — SENNOSIDES-DOCUSATE SODIUM 8.6-50 MG PO TABS: 8.6-50 mg | ORAL | Qty: 2

## 2018-02-25 MED FILL — PERFOROMIST 20 MCG/2ML IN NEBU: 20 MCG/2ML | RESPIRATORY_TRACT | Qty: 2

## 2018-02-25 MED FILL — SERTRALINE HCL 50 MG PO TABS: 50 mg | ORAL | Qty: 1

## 2018-02-25 MED FILL — OYSTER SHELL CALCIUM 500 MG PO TABS: 500 mg | ORAL | Qty: 1

## 2018-02-25 MED FILL — GABAPENTIN 300 MG PO CAPS: 300 mg | ORAL | Qty: 1

## 2018-02-25 MED FILL — DILTIAZEM HCL ER COATED BEADS 240 MG PO CP24: 240 mg | ORAL | Qty: 1

## 2018-02-25 MED FILL — BUDESONIDE 0.5 MG/2ML IN SUSP: 0.5 MG/2ML | RESPIRATORY_TRACT | Qty: 2

## 2018-02-25 MED FILL — FENTANYL 50 MCG/HR TD PT72: 50 ug/h | TRANSDERMAL | Qty: 1

## 2018-02-25 MED FILL — PANTOPRAZOLE SODIUM 40 MG PO TBEC: 40 mg | ORAL | Qty: 1

## 2018-02-25 MED FILL — POTASSIUM CHLORIDE 20 MEQ/50ML IV SOLN: 20 MEQ/50ML | INTRAVENOUS | Qty: 50

## 2018-02-25 MED FILL — LIPITOR 20 MG PO TABS: 20 mg | ORAL | Qty: 1

## 2018-02-25 MED FILL — OXYCODONE-ACETAMINOPHEN 7.5-325 MG PO TABS: 7.5-325 mg | ORAL | Qty: 1

## 2018-02-25 MED FILL — VALACYCLOVIR HCL 500 MG PO TABS: 500 mg | ORAL | Qty: 1

## 2018-02-25 MED FILL — MUCINEX 600 MG PO TB12: 600 mg | ORAL | Qty: 1

## 2018-02-25 MED FILL — BENZONATATE 100 MG PO CAPS: 100 mg | ORAL | Qty: 1

## 2018-02-25 MED FILL — CRESEMBA 186 MG PO CAPS: 186 mg | ORAL | Qty: 2

## 2018-02-25 MED FILL — PREDNISONE 5 MG PO TABS: 5 mg | ORAL | Qty: 1

## 2018-02-25 MED FILL — AMIODARONE HCL 200 MG PO TABS: 200 mg | ORAL | Qty: 1

## 2018-02-25 MED FILL — ALBUTEROL SULFATE (2.5 MG/3ML) 0.083% IN NEBU: RESPIRATORY_TRACT | Qty: 3

## 2018-02-25 MED FILL — PHOSPHA 250 NEUTRAL 155-852-130 MG PO TABS: 155-852-130 mg | ORAL | Qty: 1

## 2018-02-25 NOTE — Progress Notes (Signed)
CC: Pneumonia    No complaint of shortness of breath, but has been having a lot of cough, much with nonproductive.  It kept him up most of the night.  Afebrile  WBC 4.9  Continued empiric antibiotic therapy for CAP with pipercillin-tazobactam  Prednisone 5 mg daily  BP 104/63.  NSR.  S1, S2 diminished.  O2 saturation 95% on room air.  Breath sounds normal bilaterally.  Faint rub heard at left base.  No wheezing.  Abdomen soft, no tenderness.  No peripheral edema.  Creatinine stable 0.7.  Sodium 134, potassium 3.5, chloride 100, CO2 23.  Glucose 115.    NP: Right upper lobe pneumonia, occurring in immunocompromised host.  He is doing well with antibiotic therapy.  No concerns for gas exchange  Chronic airways disease stable, although he has a lot of cough.  He does not appear to have any problems of mucous plugging, however.  Continuing inhaled steroid and bronchodilator therapy.  Increased cough suppressant

## 2018-02-25 NOTE — Progress Notes (Signed)
Occupational Therapy   Occupational Therapy Initial Assessment/Treatment  Date: 02/25/2018   Patient Name: David Watkins  MRN: [id number]     DOB: [birth year]    Date of Service: 02/25/2018    Discharge Recommendations:    JERAL ZICK scored a 21/24 on the AM-PAC ADL Inpatient form. Current research shows that an AM-PAC score of 18 or greater is typically associated with a discharge to the patient's home setting. Based on the patient???s AM-PAC score and their current ADL deficits, it is recommended that the patient have 2-3 sessions per week of Occupational Therapy at d/c to increase the patient???s independence.    OT Equipment Recommendations  Equipment Needed: Yes    Assessment   Performance deficits / Impairments: Decreased functional mobility ;Decreased ADL status;Decreased endurance  Assessment: Pt is known to OT from previous admission.  Pt presents with a mild decline in functional independence.  Pt is from home with girlfriend and was faily independent.  Pt appears motivated to build up his strength and mobility.  Anticipate discharge to home.   Treatment Diagnosis: Impaired ADL, functional activity tolerance, and functional mobility  Prognosis: Good  Decision Making: Medium Complexity  Patient Education: Role of OT:  Pt verbalized understanding  REQUIRES OT FOLLOW UP: Yes  Activity Tolerance  Activity Tolerance: Patient limited by fatigue  Safety Devices  Safety Devices in place: Yes  Type of devices: Left in chair;Call light within reach;Chair alarm in place;Nurse notified       Treatment Diagnosis: Impaired ADL, functional activity tolerance, and functional mobility      Restrictions  Position Activity Restriction  Other position/activity restrictions: up as tol prn    Subjective   General  Chart Reviewed: Yes  Additional Pertinent Hx: Pt admitted 02/23/18 with fever.        PMH includes: Multiple myeloma, Leukemia, low back pain  Family / Caregiver Present: No  Referring Practitioner: J. Moehring,  APRN-CNP  Diagnosis: Multiple Myeloma in relapse  Subjective  Subjective: Pt in bed upon entry.   Can I wake up first?  Patient Currently in Pain: Yes(moderate chronic back pain, RN aware)  Social/Functional History  Social/Functional History  Lives With: Significant other(and 2 kids)  Type of Home: House  Home Layout: One level, Laundry in basement  Home Access: Stairs to enter with rails  Entrance Stairs - Number of Steps: 2  Bathroom Shower/Tub: Tourist information centre manager: Soil scientist: Grab bars in shower, Marketing executive, Civil engineer, contracting, Veterinary surgeon: Conservation officer, nature, Secondary school teacher  ADL Assistance: Independent  Ambulation Assistance: Independent(with walker)  Transfer Assistance: Independent  Active Driver: Yes  Occupation: Full time employment(but stopped working at last admission)  Type of occupation: Civil engineer, contracting  Leisure & Hobbies: Golf, fish, go for drives  Additional Comments: No falls.  Was getting home PT/OT.       Objective   Vision: Impaired  Vision Exceptions: Wears glasses for reading  Hearing: Within functional limits    Orientation  Overall Orientation Status: Within Functional Limits     Balance  Sitting Balance: Independent  Standing Balance: Stand by assistance  Standing Balance  Time: ~4 min  Activity: bathroom mobility/activity, walk in hall  Functional Mobility  Functional - Mobility Device: Rolling Walker  Activity: To/from bathroom;Other  Assist Level: Stand by assistance  Functional Mobility Comments: Pt required a seated rest when walking a full loop in hall  Toilet Transfers  Toilet - Technique: Ambulating  Equipment Used: Standard  toilet(grab bar)  Toilet Transfer: Stand by assistance  ADL  Grooming: Stand by assistance  LE Dressing: Stand by assistance  Toileting: Supervision(standing at toilet to urinate)  Tone RUE  RUE Tone: Normotonic  Tone LUE  LUE Tone: Normotonic  Coordination  Movements Are Fluid And Coordinated: Yes     Bed mobility  Supine  to Sit: Independent  Transfers  Stand Step Transfers: Stand by assistance  Sit to stand: Stand by assistance  Stand to sit: Stand by assistance     Cognition  Overall Cognitive Status: WNL                 LUE AROM (degrees)  LUE AROM : WFL  RUE AROM (degrees)  RUE AROM : WFL        Hand Dominance  Hand Dominance: Right         Treatment included ADL and transfer training.     Plan   Plan  Times per week: 2-5  Times per day: Daily  Current Treatment Recommendations: Therapist, nutritional, Endurance Training, Self-Care / ADL      If patient is discharged prior to next treatment, this note will serve as the discharge.    AM-PAC Score        AM-PAC Inpatient Daily Activity Raw Score: 21 (02/25/18 0924)  AM-PAC Inpatient ADL T-Scale Score : 44.27 (02/25/18 0924)  ADL Inpatient CMS 0-100% Score: 32.79 (02/25/18 1017)  ADL Inpatient CMS G-Code Modifier : CJ (02/25/18 5102)    Goals                        No goals met  Short term goals  Time Frame for Short term goals: Discharge  Short term goal 1: Transfer to/from toilet with Supervision  Short term goal 2: Stance with supervision x6 min while engaging in ADL  Patient Goals   Patient goals : Go home.  Be independent       Therapy Time   Individual Concurrent Group Co-treatment   Time In 0809         Time Out 0851         Minutes 42         Timed Code Treatment Minutes: 57 Minutes    Total Treatment Ruma, OTR/L [phone]

## 2018-02-25 NOTE — Plan of Care (Signed)
Increase independence w/ADL and functional transfers.

## 2018-02-25 NOTE — Progress Notes (Signed)
[facility] Progress Note    02/25/2018     David Watkins    MRN: [id number]    DOB: 1956-08-23    Referring MD: Harlene Salts, [place] [address]      SUBJECTIVE:  Persistent cough.  No SOB.  Denies pain.      ECOG PS:  (2) Ambulatory and capable of self care, unable to carry out work activity, up and about > 50% or waking hours    Isolation: None    Medications    Scheduled Meds:  ??? sodium chloride  250 mL Intravenous Once   ??? fentaNYL  1 patch Transdermal Q72H   ??? amiodarone  200 mg Oral Daily   ??? atorvastatin  10 mg Oral Nightly   ??? calcium elemental  500 mg Oral BID   ??? diltiazem  240 mg Oral Daily   ??? gabapentin  300 mg Oral TID   ??? guaiFENesin  600 mg Oral BID   ??? Isavuconazonium Sulfate  372 mg Oral Daily   ??? pantoprazole  40 mg Oral QAM AC   ??? phosphorus  1 tablet Oral BID   ??? pomalidomide  2 mg Oral Daily   ??? predniSONE  5 mg Oral Daily   ??? sennosides-docusate sodium  2 tablet Oral Daily   ??? sertraline  50 mg Oral Daily   ??? tiotropium  18 mcg Inhalation Daily   ??? valACYclovir  500 mg Oral BID   ??? piperacillin-tazobactam  4.5 g Intravenous Q6H   ??? sodium chloride flush  10 mL Intravenous 2 times per day   ??? Saline Mouthwash  15 mL Swish & Spit 4x Daily AC & HS   ??? budesonide  0.5 mg Nebulization BID    And   ??? formoterol  20 mcg Nebulization BID     Continuous Infusions:  ??? sodium chloride 50 mL/hr at 02/24/18 1520   ??? sodium chloride       PRN Meds:.albuterol, bisacodyl, diphenhydrAMINE, lidocaine-prilocaine, ondansetron, oxyCODONE-acetaminophen, prochlorperazine, sodium chloride, sodium chloride flush, potassium chloride, magnesium sulfate, magnesium hydroxide, Saline Mouthwash, alteplase, acetaminophen    ROS:  As noted above, otherwise remainder of 10-point ROS negative    Physical Exam:     I&O:      Intake/Output Summary (Last 24 hours) at 02/25/2018 0810  Last data filed at 02/25/2018 0615  Gross per 24 hour   Intake 2770 ml   Output 3125 ml   Net -355 ml        Vital Signs:  BP 104/69    Pulse 80    Temp 98.4 ??F (36.9 ??C) (Oral)    Resp 18    Ht 5' 8"  (1.727 m)    Wt 186 lb (84.4 kg)    SpO2 94%    BMI 28.28 kg/m??     Weight:    Wt Readings from Last 3 Encounters:   02/24/18 186 lb (84.4 kg)   02/21/18 187 lb (84.8 kg)   02/08/18 193 lb 12.6 oz (87.9 kg)         General: Awake, alert and oriented.  HEENT: normocephalic, PERRL, no scleral erythema or icterus, Oral mucosa moist and intact, throat clear  NECK: supple without palpable adenopathy  BACK: Straight negative CVAT  SKIN: warm dry and intact without lesions rashes or masses  CHEST: CTA bilaterally without use of accessory muscles  CV: Normal S1 S2, RRR, no MRG  ABD: NT ND  normoactive BS, no palpable masses or hepatosplenomegaly  EXTREMITIES: without edema, denies calf tenderness  NEURO: CN II - XII grossly intact  CATHETER: Port in R chest wall.    Data    CBC:   Recent Labs     02/23/18  1430 02/24/18  0311 02/25/18  0315   WBC 7.8 5.5 4.9   HGB 7.1* 6.7* 7.4*   HCT 21.0* 19.8* 21.7*   MCV 89.1 89.4 88.9   PLT 24* 25* 21*     BMP/Mag:  Recent Labs     02/23/18  1430 02/24/18  0311 02/25/18  0315   NA 130* 133* 134*   K 4.0 3.8 3.5   CL 94* 97* 100   CO2 24 23 23    PHOS 3.8  --  3.5   BUN 12 11 6*   CREATININE 0.7* 0.8 0.7*   MG 1.60* 1.60*  --      LIVP:   Recent Labs     02/23/18  1430 02/25/18  0315   AST 12* 11*   ALT 27 21   BILIDIR <0.2 <0.2   BILITOT 0.4 0.3   ALKPHOS 127 109     Coags:   Recent Labs     02/23/18  1430 02/24/18  0311   PROTIME 14.8* 13.7*   INR 1.30* 1.20*   APTT 27.7 28.2     Uric Acid   Recent Labs     02/23/18  1430 02/25/18  0315   LABURIC 2.1* 1.2*     Diagnostics:  1. ??PET scan (01/10/18):    ??  Myeloma Labs (01/03/18):  SPEP:????reveals that M2, previously characterized as monoclonal IgG kappa in  mid-to-slow gamma is 1.0 gm/dL, greater than the 0.2 gm/dL observed on 08 Nov 2017.??  SIFE:????Serum immunofixation electrophoresis reveals persistent M2 in mid-to-slow gamma, a monoclonal IgG  kappa. ??A recent M-spike, M1, was minor monoclonal IgG lambda in mid-gamma; M1 continues to be absent.  SFLC:  Kappa:  30.80, previously (11/08/17) -??10.40  Lambda:  1.46, previously (11/08/17) -??5.97  Free Kappa Lambda Ratio:  21.10, previously (11/08/17) -??1.74  Quantitative Immunoglobulins:  IgG:??  1480, previously (11/08/17) - 552  IgA:??  <26, previously (11/08/17) -??30  IgM:??  <20  ??  PROBLEM LIST: ??????????   ????  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 ??COPD  9.????Influenza A??(10/12/17)  10. H/o Acute pericarditis  11. PAF w/RVR  ????  TREATMENT: ??????????   ??  1. Rad Tx to T5-7, T10-L3, Right Scapula - 3000 cGy - Dr. Jamas Lav 03/20/16-04/01/16  2. RVD x1 03/20/16 - discontinued d/t rash   3. Velcade/Pomalyst/Dex x3 cycles 04/23/16-06/17/16 - discontinued d/t reaction to pomalyst  4. Velcade/Dex x 1 cycle 06/26/16 (last dose of dexamethasone 07/22/16  5. High-dose melphalan followed by administration of PBSCs 2.36 x10^6 cd34cells/kg on 08/28/16  6. Maintenance Revlimid 62m daily (12/2016-04/23/17)  7. Dexamethasone 476mdaily (04/24/17)  8. DCEP   Cycle #1 - 04/26/17  Cycle #2 - 05/25/17 - excellent response  9.??Dara/Velcade/Dex (C1D1 - 06/22/17) - BMBx ??10/18/17 - CR  Cycles 7-8 (21 day cycle)  - Dex 20 mg po D4,5.8,9,11 and 12  - Dara 16 mg/Kg D1 only with Dex 20 mg IV  - Velcade 1.3 mg/M2 D1,4,8 and 11  Cycle 9 and beyond (maintenance, 11/08/17??- 01/03/18)  Dara 16 mg/Kg Q  28 days  Dex 12 mg IV D1  Velcade Q 2 weeks??  10. DCEP 01/13/18  11. Elo/Pom/Dex - C1D1 02/18/18  ??  ASSESSMENT AND PLAN:??????????   ??  1. IgG kappa multiple myeloma / Plasma Cell Leukemia:??Relapsed disease   - S/p treatment Dara/Velcade/Dex x 8 cycles (06/22/17 - 10/2017). Followed by maintenance Dara Q4 wks/Velcade Q2ks/Dex (started 11/08/17)  - Progressive  disease based on myeloma labs (01/03/18) &??PET scan (01/10/18), see above   - S/p 1 cycle DCEP     PLAN: Elo/Pom 2 mg/Dex (hold pred on dex days) (C1D1 02/18/18). Hope for allogeneic transplant in CR2, but needs to disease response and improvement in lung function   - M-spike (01/27/18) - 0.5 g/dL, previously 1.0 g/dL  - MMP 02/18/18: M-spike 0.5gm/dL (decreased from 1 12/2017). SFLC ratio 4.95  ??  Elo/Pom/Dex  Cycle 1 Day +8    2. ID:??Admitted with fever up to 102.4 as outpt 2/2 RUL bacterial PNA. Afebrile since admission  - Fungitell and galactomannan (01/25/18) -??Positive; Bronch / Diatherix (02/03/18) - parainfluenza virus, Acinetobacter baumanni, Adenovirus -- S/p Meropenem Day x 19 days  - Consulted ID: No need to cont abx for Acinetobacter PNA, but cont antifungal for tree in bud on CT chest  - Continue Valtrex ppx  - Cont??Cresemba for fungal PNA. Cont to monitor fungitel weekly:   - 5/28 - + 403   - 6/28 - pending  - Blood Cxs 6/26 - NGTD  - CXR 02/23/18: RUL pneumonia  - Cont Zoysn Day +3 (02/23/18)??    3. Heme:??Chemotherapy induced anemia and thrombocytopenia   - Transfuse for Hgb <??8 (severe COPD) and Platelets <??20K  -No transfusion today    4. Metabolic: HypoNa, otherwise electrolytes & renal fxn stable  - Cont KPhos 1 tablet BID (started 02/04/18)  - Keep Mg > 2 and K+ > 4.0??  - Cont IVFs: NS at 55m/hr    5. Pulmonary: H/o severe COPD, Continue treatment for possible fungal PNA.   Imaging:  - Pulm Nodule: Stable on CT chest since 04/23/17; cont to monitor.   - CTPA (01/22/18): No PE, patchy groundglass opacities and tree-in-bud are seen throughout the lungs which are new compared to prior examination.   - CT chest (01/31/18): Stable innumerable pulmonary nodules bilaterally favoring metastatic disease.  - PFT (12/27/17): compared to 07/2016: FVC 3.25 L, 66%, and FEV1  1.42 L, 38%. FEV1/FVC is 44%. This is unchanged from prior study. Air trapping seen on lung volumes. Diffusion capacity 3.91, 79% of predicted, down from  5.21     Tx:  - Cont Steroid taper:??Solumedrol??80 mg TID (started 01/22/18), 40 mg TID (01/25/18), 40 mg bid (01/26/18), decrease/change Pred 25 mg bid (02/03/18),??40 mg daily x 5 days, 20 mg daily x 5 days (begin 02/16/18), 10 mg daily x 5 days, 5 mg daily x 5 days, then stop (last dose 02/27/18)  -??Will be followed off prednisone receiving only the Decadron pulses as part of his chemotherapy   - COPD: Cont??Spiriva and Breo  - Cont ID tx as above  - Consult Dr. BDorann Lodge- appreciate recs  - ? Amiodarone pulmonary toxicity contributing to pulmonary symptoms    6. GI / Nutrition: Appetite and oral intake is fair  - Continue PPI ppx w/ steroids   - Continue low microbial diet   Constipation: Improved  - Continue Dulcolax prn &??Senokot-s 2 tabs daily     7. Cardiac: H/o PAF w/RVR, Acute pericarditis, HLD, & evidence of septal wall  defect on echocardiogram  - Echo (01/23/18): LVEF 50-55% w/ normal diastolic fxn &??no obvious regional wall motion abnormalities  - Continue Lipitor   - Continue Diltiazem CD 240 mg daily & Amiodarone 200 mg daily.??Lopressor 25 mg bid on hold 6/17 until BP increases    8. Psych, Anxiety & Depression: Ongoing concerns about disease progression and ability to get transplant   - Continue Zoloft 50 mg daily   - Psych to follow as needed once outpt    9. Peripheral Neuropathy:??Ongoing  - Continue Gabapentin 300 mg TID    10. Bone Health: No acute fx   - H/o spinal cord compression &??diffuse lytic lesions t/o skeleton  - CT Chest (04/23/17) - multiple lytic lesions throughout the skeleton   - Continue Ca/Vit D &??Zometa monthly (given 02/10/18)     11. 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  ??  DVT Prophylaxis: ??SCD's; plts <  50K  ??  Dispostion:??Once afebrile.??  ??    Loma Newton, APRN - CNP     Juliann Mule. Derrill Kay, [place]  [place]

## 2018-02-25 NOTE — Plan of Care (Signed)
Problem: Bleeding:  Goal: Will show no signs and symptoms of excessive bleeding  Description  Will show no signs and symptoms of excessive bleeding  02/25/2018 1326 by Viann Shove, RN  Outcome: Ongoing  Note:   Patient's hemoglobin this AM:   Recent Labs     02/25/18  0315   HGB 7.4*     Patient's platelet count this AM:   Recent Labs     02/25/18  0315   PLT 21*    Thrombocytopenia not present at this time.  Patient showing no signs or symptoms of active bleeding.  Transfusion not indicated at this time.  Patient verbalizes understanding of all instructions. Will continue to assess and implement POC. Call light within reach and hourly rounding in place.         Problem: Falls - Risk of:  Goal: Will remain free from falls  Description  Will remain free from falls  02/25/2018 1326 by Viann Shove, RN  Outcome: Ongoing  Note:   Pt with weak but steady gait, when ambulating pt uses walker, no other issues at this time, instructed pt to call out for assistance as needed, pt verbalized understanding, bed in low position, bed alarm on, side rails up, call light in reach, will continue to monitor.           Problem: Pain:  Goal: Pain level will decrease  Description  Pain level will decrease  02/25/2018 1326 by Viann Shove, RN  Outcome: Ongoing  Note:   Patient complains of chronic back pain, pt requests pain medication. Patient medicated with PRN percocet, see MAR, and repositioned in the bed independently. Pt resting upon reassessment. Will continue to monitor.            Problem: PROTECTIVE PRECAUTIONS  Goal: Patient will remain free of nosocomial Infections  02/25/2018 1326 by Viann Shove, RN  Outcome: Ongoing  Note:   Pt remains in protective precautions. Pt, staff, and visitors adhering to handwashing guidelines. Pt educated to shower or bathe daily with chlorhexidine and linens changed daily per protocol. Pt verbalizes understanding of low microbial diet. Will continue to monitor.       Problem: Skin  Integrity:  Goal: Absence of new skin breakdown  Description  Absence of new skin breakdown  02/25/2018 0441 by Elder Love, RN  Outcome: Ongoing  Note:   Pt's skin remains intact, no evidence of breakdown noted, will continue to monitor.

## 2018-02-25 NOTE — Plan of Care (Signed)
Increase functional independence

## 2018-02-25 NOTE — Plan of Care (Signed)
Problem: Falls - Risk of:  Goal: Will remain free from falls  Description  Will remain free from falls  Outcome: Ongoing  Note:   Pt with weak but steady gait, up with standby assistance, no other issues at this time, instructed pt to call out for assistance as needed, pt verbalized understanding, bed in low position, bed alarm on, side rails up, call light in reach, will continue to monitor.      Problem: Infection - Central Venous Catheter-Associated Bloodstream Infection:  Goal: Will show no infection signs and symptoms  Description  Will show no infection signs and symptoms  Outcome: Ongoing  Note:   Pt afebrile, no S/S of infection, CVC site free from S/S of infection, no drainage, edema, redness, swelling, or tenderness, flushes easily with brisk blood return, dressing changes continue per protocol and on an as needed basis - see flowsheet, will continue to monitor.     Problem: Bleeding:  Goal: Will show no signs and symptoms of excessive bleeding  Description  Will show no signs and symptoms of excessive bleeding  Outcome: Ongoing  Note:   Pt at risk for bleeding, no S/S of bleeding noted, PLTS were 21 with AM labs, orders to transfuse if equal to or less then 10, will continue to monitor.     Problem: PROTECTIVE PRECAUTIONS  Goal: Patient will remain free of nosocomial Infections  Outcome: Ongoing  Note:   Pt afebrile, no S/S of infection, will continue to monitor.    Compliant with [facility] Bath Protocol:  Performed CHG bath today per [facility] protocol utilizing CHG wipes. CVC site cleansed with CHG wipe over dressing, skin surrounding dressing, and first 6" of IV tubing.  Pt tolerated well.  Continued to encourage daily CHG bathing per [facility] protocol.     Problem: Activity:  Goal: Ability to tolerate increased activity will improve  Description  Ability to tolerate increased activity will improve  Outcome: Ongoing  Note:   Stable/No Isolation Precautions:  Pt with activity orders for up with assistance.  Encouraged  pt to be up OOB as much as possible throughout the day and for all meals.  Encouraged frequent short naps as necessary to preserve energy but instructed that while awake, pt should be OOB.  Encouraged pt to ambulate in halls.  Pt not seen up in halls this shift. Pt is visualized to be OOB 0-25% of the time this shift.  Will continue to encourage frequent activity.     Problem: Skin Integrity:  Goal: Absence of new skin breakdown  Description  Absence of new skin breakdown  Outcome: Ongoing  Note:   Pt's skin remains intact, no evidence of breakdown noted, will continue to monitor.     Problem: Venous Thromboembolism:  Goal: Will show no signs or symptoms of venous thromboembolism  Description  Will show no signs or symptoms of venous thromboembolism  Outcome: Ongoing  Note:   Adherent with DVT Prevention: Pt is at risk for DVT d/t decreased mobility and cancer treatment.  Pt educated on importance of activity.  Pt has orders for SCDs while in bed.  Pt verbalizes understanding of need for prophylaxis while inpatient.      Problem: Pain:  Goal: Pain level will decrease  Description  Pain level will decrease  Outcome: Ongoing  Note:   Pt reporting chronic back pain, administered oxycodone x2, pt asleep upon reassessments but later reports adequate relief, will continue to monitor.

## 2018-02-25 NOTE — Progress Notes (Signed)
Physical Therapy    Facility/Department: [facility]  Initial Assessment/Treatment    NAME: David Watkins  DOB: 1955-11-14  MRN: [id number]    Date of Service: 02/25/2018    Discharge Recommendations: David Watkins scored a 20  /24 on the AM-PAC short mobility form. Current research shows that an AM-PAC score of 18 or greater is typically associated with a discharge to the patient's home setting. Based on the patient's AM-PAC score and their current functional mobility deficits, it is recommended that the patient have 2-3 sessions per week of Physical Therapy at d/c to increase the patient's independence.    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 Admitted to hospital with pneumonia.  Pt was getting home PT prior to admit & wants to keep up his strength while here & not decline.  Able to walk lap in hall with walker today with slow but steady gait.  1 seated rest break due to fatigue & SOB.  Anticipate return home with A at dc & continued home PT.  Will continue to follow while here.   Treatment Diagnosis: impaired mobility  Prognosis: Good  Decision Making: Medium Complexity  Patient Education: role of PT- verbalized understanding  REQUIRES PT FOLLOW UP: Yes  Activity Tolerance  Activity Tolerance: Patient Tolerated treatment well  Activity Tolerance: O2 sats 92-93% after walking on RA.  Pt required 1 seated rest break during walk.       Patient Diagnosis(es): There were no encounter diagnoses.     has a past medical  history of Bone pain, COPD (chronic obstructive pulmonary disease) (HCC), Hyperlipidemia, Leukemia, plasma cell, in relapse (HCC), Low back pain, Multiple myeloma (HCC), and Neuropathy.   has a past surgical history that includes bone marrow biopsy; other surgical history (Left, 08/17/2016); bone marrow transplant; pre-malignant / benign skin lesion excision (Left, 03/29/2017); bronchoscopy (N/A, 02/03/2018); bronchoscopy (02/03/2018); Endoscopy, colon, diagnostic; and skin biopsy.    Restrictions  Position Activity Restriction  Other position/activity restrictions: up as tol prn        Subjective  General  Chart Reviewed: Yes  Additional Pertinent Hx: Adm 6/26- 62 yo male w/ 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  CXR 6/26: New airspace disease of the right upper lobe, and to a lesser extent the right lower lobe.  Family / Caregiver Present: No  Referring Practitioner: Glenna Fellows, APRN - CNP  Diagnosis: neutropenic fever  Follows Commands: Within Functional Limits  Subjective  Subjective: Found in bed, agreeable to PT.  "Its a little early for this but lets go."  Reports multiple lesions throughout body.  "So, yes I'm going to have pain."  Felt he was making progress with home therapy & doesn't want to go backwards while in hospital.   Pain Screening  Patient Currently in Pain: Yes(moderate chronic back pain, RN aware)  Vital Signs  Patient Currently in Pain: Yes(moderate chronic back pain, RN aware)  Orientation  Orientation  Overall Orientation Status: Within Normal Limits  Social/Functional History  Social/Functional History  Lives With: Significant other(and 2 kids)  Type of Home: House  Home Layout: One level, Laundry in basement  Home Access: Stairs to enter with rails  Entrance Stairs - Number of Steps: 2  Bathroom Shower/Tub: Pension scheme manager: Midwife: Grab bars in shower, Psychologist, prison and probation services, Paediatric nurse, Psychologist, sport and exercise: Clinical biochemist, Sports administrator  ADL Assistance: Independent  Ambulation Assistance: Independent(with walker)  Transfer Assistance: Independent  Active Driver: Yes  Occupation: Full time employment(but stopped working at last admission)  Type of occupation: Engineer, manufacturing  Leisure & Hobbies: Golf, fish, go for drives  Additional Comments: No falls.  Was getting home PT/OT.       Objective          AROM RLE (degrees)  RLE AROM: WNL  AROM LLE (degrees)  LLE AROM : WNL  Strength RLE  Comment: independent with LAQ; MMT not performed due to low platelets but appears WFL  Strength LLE  Comment: independent with LAQ; MMT not performed due to low platelets but appears WFL        Bed mobility  Supine to Sit: Independent  Transfers  Sit to Stand: Stand by assistance(cues for hand placement)  Stand to sit: Stand by assistance(cues for hand placement)  Ambulation  Ambulation?: Yes  Ambulation 1  Surface: level tile  Device: Rolling Walker  Assistance: Stand by assistance  Quality of Gait: slightly flexed posture, no loss of balance noted today  Gait Deviations: Slow Cadence;Decreased step length;Decreased step height  Distance: 120', 80'  Comments: 1 seated rest break due to fatigue & SOB.  Stairs/Curb  Stairs?: No     Balance  Sitting - Static: Good(independent at EOB x 5 min)  Exercises  Comments: Brought written copy of HEP to hospital with him.  Has seated & supine exercises to perform- pt verbalized understanding of all.   Treatment included gait/transfer training, pt education.    Plan   Plan  Times per week: 2-5  Current Treatment Recommendations: Transfer Training, Strengthening, Location manager, Investment banker, operational, Building services engineer, Patent attorney, Equities trader, Curator Devices  Type of devices: Call light within reach, Chair alarm in place, Left in chair, Nurse notified                                                               Goals  Short term goals  Time Frame  for Short term goals: dc  Short term goal 1: Ambulate 300' with RW S.  Short term goal 2: Transfers S.  Short term goal 3: Perform 10 reps standing LE ex with UEs supported SBA.  Short term goal 4: up/down 2 steps with rail SBA  Patient Goals   Patient goals : "improve mobility & increase strength"; plans for home at dc       Therapy Time   Individual Concurrent Group Co-treatment   Time In 0809         Time Out 0851         Minutes 42           Timed Code Treatment Minutes:   25  Total Treatment Minutes:  [address], [place] [address]  If patient is discharged prior to next treatment, this note will serve as the discharge summary.

## 2018-02-26 LAB — BASIC METABOLIC PANEL
Anion Gap: 12 (ref 3–16)
BUN: 6 mg/dL — ABNORMAL LOW (ref 7–20)
CO2: 23 mmol/L (ref 21–32)
Calcium: 8.1 mg/dL — ABNORMAL LOW (ref 8.3–10.6)
Chloride: 100 mmol/L (ref 99–110)
Creatinine: 0.7 mg/dL — ABNORMAL LOW (ref 0.8–1.3)
GFR African American: >60 (ref >60)
GFR Non-African American: >60 (ref >60)
Glucose: 103 mg/dL — ABNORMAL HIGH (ref 70–99)
Potassium: 3.1 mmol/L — ABNORMAL LOW (ref 3.5–5.1)
Sodium: 135 mmol/L — ABNORMAL LOW (ref 136–145)

## 2018-02-26 LAB — 1,3 BETA-D-GLUCAN
(1,3)-Beta-D-Glucan (Fungitell) Interpretation: POSITIVE — AB
(1,3)-Beta-D-Glucan (fungitell): 201 pg/mL

## 2018-02-26 LAB — CBC WITH AUTO DIFFERENTIAL
Basophils %: 0.3 %
Basophils Absolute: 0 10*3/uL (ref 0.0–0.2)
Eosinophils %: 0.1 %
Eosinophils Absolute: 0 10*3/uL (ref 0.0–0.6)
Hematocrit: 21.7 % — ABNORMAL LOW (ref 40.5–52.5)
Hemoglobin: 7.4 g/dL — ABNORMAL LOW (ref 13.5–17.5)
Lymphocytes %: 4.8 %
Lymphocytes Absolute: 0.2 10*3/uL — ABNORMAL LOW (ref 1.0–5.1)
MCH: 30.7 pg (ref 26.0–34.0)
MCHC: 34 g/dL (ref 31.0–36.0)
MCV: 90.4 fL (ref 80.0–100.0)
MPV: 8.2 fL (ref 5.0–10.5)
Monocytes %: 7.9 %
Monocytes Absolute: 0.4 10*3/uL (ref 0.0–1.3)
Neutrophils %: 86.9 %
Neutrophils Absolute: 4.1 10*3/uL (ref 1.7–7.7)
Platelets: 21 10*3/uL — ABNORMAL LOW (ref 135–450)
RBC: 2.4 M/uL — ABNORMAL LOW (ref 4.20–5.90)
RDW: 19.1 % — ABNORMAL HIGH (ref 12.4–15.4)
WBC: 4.8 10*3/uL (ref 4.0–11.0)

## 2018-02-26 LAB — CULTURE, HSV

## 2018-02-26 LAB — CULTURE, VRE: VRE Culture: NEGATIVE

## 2018-02-26 MED FILL — HYDROCODONE-HOMATROPINE 5-1.5 MG/5ML PO SYRP: 5-1.5 MG/5ML | ORAL | Qty: 5

## 2018-02-26 MED FILL — MUCINEX DM 30-600 MG PO TB12: 30-600 mg | ORAL | Qty: 2

## 2018-02-26 MED FILL — BENZONATATE 100 MG PO CAPS: 100 mg | ORAL | Qty: 1

## 2018-02-26 MED FILL — GABAPENTIN 300 MG PO CAPS: 300 mg | ORAL | Qty: 1

## 2018-02-26 MED FILL — POTASSIUM CHLORIDE 20 MEQ/50ML IV SOLN: 20 MEQ/50ML | INTRAVENOUS | Qty: 200

## 2018-02-26 MED FILL — VALACYCLOVIR HCL 500 MG PO TABS: 500 mg | ORAL | Qty: 1

## 2018-02-26 MED FILL — OYSTER SHELL CALCIUM 500 MG PO TABS: 500 mg | ORAL | Qty: 1

## 2018-02-26 MED FILL — SODIUM CHLORIDE 0.9 % IV SOLN: 0.9 % | INTRAVENOUS | Qty: 1000

## 2018-02-26 MED FILL — PIPERACILLIN SOD-TAZOBACTAM SO 4.5 (4-0.5) G IV SOLR: 4.5 (4-0.5) g | INTRAVENOUS | Qty: 4.5

## 2018-02-26 MED FILL — BUDESONIDE 0.5 MG/2ML IN SUSP: 0.5 MG/2ML | RESPIRATORY_TRACT | Qty: 2

## 2018-02-26 MED FILL — PHOSPHA 250 NEUTRAL 155-852-130 MG PO TABS: 155-852-130 mg | ORAL | Qty: 1

## 2018-02-26 MED FILL — SENNOSIDES-DOCUSATE SODIUM 8.6-50 MG PO TABS: 8.6-50 mg | ORAL | Qty: 2

## 2018-02-26 MED FILL — CRESEMBA 186 MG PO CAPS: 186 mg | ORAL | Qty: 2

## 2018-02-26 MED FILL — PERFOROMIST 20 MCG/2ML IN NEBU: 20 MCG/2ML | RESPIRATORY_TRACT | Qty: 2

## 2018-02-26 MED FILL — PREDNISONE 5 MG PO TABS: 5 mg | ORAL | Qty: 1

## 2018-02-26 MED FILL — PANTOPRAZOLE SODIUM 40 MG PO TBEC: 40 mg | ORAL | Qty: 1

## 2018-02-26 MED FILL — OXYCODONE-ACETAMINOPHEN 7.5-325 MG PO TABS: 7.5-325 mg | ORAL | Qty: 1

## 2018-02-26 MED FILL — SERTRALINE HCL 50 MG PO TABS: 50 mg | ORAL | Qty: 1

## 2018-02-26 MED FILL — AMIODARONE HCL 200 MG PO TABS: 200 mg | ORAL | Qty: 1

## 2018-02-26 MED FILL — SODIUM CHLORIDE 0.9 % IV SOLN: 0.9 % | INTRAVENOUS | Qty: 500

## 2018-02-26 MED FILL — DILTIAZEM HCL ER COATED BEADS 240 MG PO CP24: 240 mg | ORAL | Qty: 1

## 2018-02-26 MED FILL — LIPITOR 20 MG PO TABS: 20 mg | ORAL | Qty: 1

## 2018-02-26 NOTE — Plan of Care (Signed)
Problem: Falls - Risk of:  Goal: Will remain free from falls  Description  Will remain free from falls  Outcome: Met This Shift  Note:   Patient is a high risk for falls per fall assessment.  Patient is in bed, bed in low position with brake and bed alarm on, call light in reach which patient uses appropriately.  Patient remains free from falls this shift.      Problem: Pain:  Goal: Control of chronic pain  Description  Control of chronic pain  Outcome: Met This Shift  Note:   Patient medicated once this shift for pain.  See pain assessment, MAR, and response to pain intervention on flow sheet.      Problem: PROTECTIVE PRECAUTIONS  Goal: Patient will remain free of nosocomial Infections  Outcome: Met This Shift  Note:   Patient in protective precautions, in private room, on low microbial diet. And all visitors and staff compliant with hand hygiene to prevent spread of infection.      Problem: Skin Integrity:  Goal: Absence of new skin breakdown  Description  Absence of new skin breakdown  Outcome: Met This Shift  Note:   Scattered bruising noted.  No other skin breakdown seen.  Patient turns and repositions self to maintain skin integrity.      Problem: Venous Thromboembolism:  Goal: Will show no signs or symptoms of venous thromboembolism  Description  Will show no signs or symptoms of venous thromboembolism  Outcome: Met This Shift  Note:   NO signs or symptoms of DVT.      Problem: Infection - Central Venous Catheter-Associated Bloodstream Infection:  Goal: Will show no infection signs and symptoms  Description  Will show no infection signs and symptoms  Outcome: Ongoing  Note:   Patient afebrile this shift.  Antibiotics administered per MD order.  See MAR for dosages and administration times.

## 2018-02-26 NOTE — Progress Notes (Signed)
Patient up walking in hall, made two laps, patient has been in bed all day, states he is in a funk and is not motivated.  Spent a long time with patient discussion treatment versus palliative care options.  Return to room, sitting up in chair now, eating dinner.

## 2018-02-26 NOTE — Plan of Care (Signed)
Problem: Pain:  Goal: Control of chronic pain  Description  Control of chronic pain    Outcome: Met This Shift  Note:   Patient medicated once this shift for pain.  See pain assessment, MAR, and response to pain intervention on flow sheet.      Problem: PROTECTIVE PRECAUTIONS  Goal: Patient will remain free of nosocomial Infections    Outcome: Met This Shift  Note:   Patient in protective precautions, in private room, on low microbial diet. And all visitors and staff compliant with hand hygiene to prevent spread of infection.      Problem: Skin Integrity:  Goal: Absence of new skin breakdown  Description  Absence of new skin breakdown    Outcome: Met This Shift  Note:   Scattered bruising noted.  No other skin breakdown seen.  Patient turns and repositions self to maintain skin integrity.      Problem: Venous Thromboembolism:  Goal: Will show no signs or symptoms of venous thromboembolism  Description  Will show no signs or symptoms of venous thromboembolism    Outcome: Met This Shift  Note:   NO signs or symptoms of DVT.

## 2018-02-26 NOTE — Progress Notes (Signed)
[facility] Progress Note    02/26/2018     David Watkins    MRN: [id number]    DOB: 29-Jun-1956    Referring MD: Harlene [place] [address]      SUBJECTIVE:  Cough improved.  Denies SOB or pain.  Able to walk, encouraged to do so.    ECOG PS:  (2) Ambulatory and capable of self care, unable to carry out work activity, up and about > 50% or waking hours    Isolation: None    Medications    Scheduled Meds:  ??? dextromethorphan-guaiFENesin  2 tablet Oral BID   ??? fentaNYL  1 patch Transdermal Q72H   ??? amiodarone  200 mg Oral Daily   ??? atorvastatin  10 mg Oral Nightly   ??? calcium elemental  500 mg Oral BID   ??? diltiazem  240 mg Oral Daily   ??? gabapentin  300 mg Oral TID   ??? Isavuconazonium Sulfate  372 mg Oral Daily   ??? pantoprazole  40 mg Oral QAM AC   ??? phosphorus  1 tablet Oral BID   ??? pomalidomide  2 mg Oral Daily   ??? predniSONE  5 mg Oral Daily   ??? sennosides-docusate sodium  2 tablet Oral Daily   ??? sertraline  50 mg Oral Daily   ??? tiotropium  18 mcg Inhalation Daily   ??? valACYclovir  500 mg Oral BID   ??? piperacillin-tazobactam  4.5 g Intravenous Q6H   ??? sodium chloride flush  10 mL Intravenous 2 times per day   ??? Saline Mouthwash  15 mL Swish & Spit 4x Daily AC & HS   ??? budesonide  0.5 mg Nebulization BID    And   ??? formoterol  20 mcg Nebulization BID     Continuous Infusions:  ??? sodium chloride 50 mL/hr at 02/24/18 1520   ??? sodium chloride       PRN Meds:.magnesium sulfate, potassium chloride, benzonatate, HYDROcodone-homatropine, albuterol, bisacodyl, diphenhydrAMINE, lidocaine-prilocaine, ondansetron, oxyCODONE-acetaminophen, prochlorperazine, sodium chloride, sodium chloride flush, potassium chloride, magnesium sulfate, magnesium hydroxide, Saline Mouthwash, alteplase, acetaminophen    ROS:  As noted above, otherwise remainder of 10-point ROS negative    Physical Exam:     I&O:      Intake/Output Summary (Last 24 hours) at 02/26/2018 0918  Last data filed at 02/26/2018  0615  Gross per 24 hour   Intake 2395 ml   Output 2300 ml   Net 95 ml       Vital Signs:  BP 105/74    Pulse 82    Temp 98 ??F (36.7 ??C) (Oral)    Resp 23    Ht 5' 8"  (1.727 m)    Wt 187 lb 3.2 oz (84.9 kg)    SpO2 (!) 85%    BMI 28.46 kg/m??     Weight:    Wt Readings from Last 3 Encounters:   02/25/18 187 lb 3.2 oz (84.9 kg)   02/21/18 187 lb (84.8 kg)   02/08/18 193 lb 12.6 oz (87.9 kg)         General: Awake, alert and oriented.  HEENT: normocephalic, PERRL, no scleral erythema or icterus, Oral mucosa moist and intact, throat clear  NECK: supple without palpable adenopathy  BACK: Straight negative CVAT  SKIN: warm dry and intact without lesions rashes or masses  CHEST: CTA bilaterally without use of accessory muscles  CV: Normal S1 S2, RRR,  no MRG  ABD: NT ND normoactive BS, no palpable masses or hepatosplenomegaly  EXTREMITIES: without edema, denies calf tenderness  NEURO: CN II - XII grossly intact  CATHETER: Port in R chest wall.    Data    CBC:   Recent Labs     02/24/18  0311 02/25/18  0315 02/26/18  0330   WBC 5.5 4.9 4.8   HGB 6.7* 7.4* 7.4*   HCT 19.8* 21.7* 21.7*   MCV 89.4 88.9 90.4   PLT 25* 21* 21*     BMP/Mag:  Recent Labs     02/23/18  1430 02/24/18  0311 02/25/18  0315 02/26/18  0330   NA 130* 133* 134* 135*   K 4.0 3.8 3.5 3.1*   CL 94* 97* 100 100   CO2 24 23 23 23    PHOS 3.8  --  3.5  --    BUN 12 11 6* 6*   CREATININE 0.7* 0.8 0.7* 0.7*   MG 1.60* 1.60*  --   --      LIVP:   Recent Labs     02/23/18  1430 02/25/18  0315   AST 12* 11*   ALT 27 21   BILIDIR <0.2 <0.2   BILITOT 0.4 0.3   ALKPHOS 127 109     Coags:   Recent Labs     02/23/18  1430 02/24/18  0311   PROTIME 14.8* 13.7*   INR 1.30* 1.20*   APTT 27.7 28.2     Uric Acid   Recent Labs     02/23/18  1430 02/25/18  0315   LABURIC 2.1* 1.2*     Diagnostics:  1. ??PET scan (01/10/18):    ??  Myeloma Labs (01/03/18):  SPEP:????reveals that M2, previously characterized as monoclonal IgG kappa in  mid-to-slow gamma is 1.0 gm/dL, greater than the 0.2  gm/dL observed on 08 Nov 2017.??  SIFE:????Serum immunofixation electrophoresis reveals persistent M2 in mid-to-slow gamma, a monoclonal IgG kappa. ??A recent M-spike, M1, was minor monoclonal IgG lambda in mid-gamma; M1 continues to be absent.  SFLC:  Kappa:  30.80, previously (11/08/17) -??10.40  Lambda:  1.46, previously (11/08/17) -??5.97  Free Kappa Lambda Ratio:  21.10, previously (11/08/17) -??1.74  Quantitative Immunoglobulins:  IgG:??  1480, previously (11/08/17) - 552  IgA:??  <26, previously (11/08/17) -??30  IgM:??  <20  ??  PROBLEM LIST: ??????????   ????  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 ??COPD  9.????Influenza A??(10/12/17)  10. H/o Acute pericarditis  11. PAF w/RVR  ????  TREATMENT: ??????????   ??  1. Rad Tx to T5-7, T10-L3, Right Scapula - 3000 cGy - Dr. Jamas Lav 03/20/16-04/01/16  2. RVD x1 03/20/16 - discontinued d/t rash   3. Velcade/Pomalyst/Dex x3 cycles 04/23/16-06/17/16 - discontinued d/t reaction to pomalyst  4. Velcade/Dex x 1 cycle 06/26/16 (last dose of dexamethasone 07/22/16  5. High-dose melphalan followed by administration of PBSCs 2.36 x10^6 cd34cells/kg on 08/28/16  6. Maintenance Revlimid 83m daily (12/2016-04/23/17)  7. Dexamethasone 449mdaily (04/24/17)  8. DCEP   Cycle #1 - 04/26/17  Cycle #2 - 05/25/17 - excellent response  9.??Dara/Velcade/Dex (C1D1 - 06/22/17) - BMBx ??10/18/17 - CR  Cycles 7-8 (21 day cycle)  - Dex 20 mg po D4,5.8,9,11 and 12  - Dara 16 mg/Kg D1 only with Dex 20 mg IV  -  Velcade 1.3 mg/M2 D1,4,8 and 11  Cycle 9 and beyond (maintenance, 11/08/17??- 01/03/18)  Dara 16 mg/Kg Q 28 days  Dex 12 mg IV D1  Velcade Q 2 weeks??  10. DCEP 01/13/18  11. Elo/Pom/Dex - C1D1 02/18/18  ??  ASSESSMENT AND PLAN:??????????   ??  1. IgG kappa multiple myeloma / Plasma Cell Leukemia:??Relapsed disease   - S/p  treatment Dara/Velcade/Dex x 8 cycles (06/22/17 - 10/2017). Followed by maintenance Dara Q4 wks/Velcade Q2ks/Dex (started 11/08/17)  - Progressive disease based on myeloma labs (01/03/18) &??PET scan (01/10/18), see above   - S/p 1 cycle DCEP     PLAN: Elo/Pom 2 mg/Dex (hold pred on dex days) (C1D1 02/18/18). Hope for allogeneic transplant in CR2, but needs to disease response and improvement in lung function   - M-spike (01/27/18) - 0.5 g/dL, previously 1.0 g/dL  - MMP 02/18/18: M-spike 0.5gm/dL (decreased from 1 12/2017). SFLC ratio 4.95  ??  Elo/Pom/Dex  Cycle 1 Day +9    2. ID:??Admitted with fever up to 102.4 as outpt 2/2 RUL bacterial PNA. Afebrile since admission  - Fungitell and galactomannan (01/25/18) -??Positive; Bronch / Diatherix (02/03/18) - parainfluenza virus, Acinetobacter baumanni, Adenovirus -- S/p Meropenem Day x 19 days  - Consulted ID: No need to cont abx for Acinetobacter PNA, but cont antifungal for tree in bud on CT chest  - Continue Valtrex ppx  - Cont??Cresemba for fungal PNA. Cont to monitor fungitel weekly:   - 5/28 - + 403   - 6/28 - pending  - Blood Cxs 6/26 - NGTD  - CXR 02/23/18: RUL pneumonia  - Cont Zoysn Day +4 (02/23/18)??    3. Heme:??Chemotherapy induced anemia and thrombocytopenia   - Transfuse for Hgb <??8 (severe COPD) and Platelets <??20K  -No transfusion today    4. Metabolic: HypoNa, otherwise electrolytes & renal fxn stable  - Cont KPhos 1 tablet BID (started 02/04/18)  - Keep Mg > 2 and K+ > 4.0??  - Cont IVFs: NS at 96m/hr    5. Pulmonary: H/o severe COPD, Continue treatment for possible fungal PNA.   Imaging:  - Pulm Nodule: Stable on CT chest since 04/23/17; cont to monitor.   - CTPA (01/22/18): No PE, patchy groundglass opacities and tree-in-bud are seen throughout the lungs which are new compared to prior examination.   - CT chest (01/31/18): Stable innumerable pulmonary nodules bilaterally favoring metastatic disease.  - PFT (12/27/17): compared to 07/2016: FVC 3.25 L, 66%, and FEV1  1.42 L,  38%. FEV1/FVC is 44%. This is unchanged from prior study. Air trapping seen on lung volumes. Diffusion capacity 3.91, 79% of predicted, down from 5.21     Tx:  - Cont Steroid taper:??Solumedrol??80 mg TID (started 01/22/18), 40 mg TID (01/25/18), 40 mg bid (01/26/18), decrease/change Pred 25 mg bid (02/03/18),??40 mg daily x 5 days, 20 mg daily x 5 days (begin 02/16/18), 10 mg daily x 5 days, 5 mg daily x 5 days, then stop (last dose 02/27/18)  -??Will be followed off prednisone receiving only the Decadron pulses as part of his chemotherapy   - COPD: Cont??Spiriva and Breo  - Cont ID tx as above  - Consult Dr. BDorann Lodge- appreciate recs  - ? Amiodarone pulmonary toxicity contributing to pulmonary symptoms    6. GI / Nutrition: Appetite and oral intake is fair  - Continue PPI ppx w/ steroids   - Continue low microbial diet   Constipation: Improved  - Continue Dulcolax prn &??Senokot-s 2  tabs daily     7. Cardiac: H/o PAF w/RVR, Acute pericarditis, HLD, & evidence of septal wall defect on echocardiogram  - Echo (01/23/18): LVEF 50-55% w/ normal diastolic fxn &??no obvious regional wall motion abnormalities  - Continue Lipitor   - Continue Diltiazem CD 240 mg daily & Amiodarone 200 mg daily.??Lopressor 25 mg bid on hold 6/17 until BP increases    8. Psych, Anxiety & Depression: Ongoing concerns about disease progression and ability to get transplant   - Continue Zoloft 50 mg daily   - Psych to follow as needed once outpt    9. Peripheral Neuropathy:??Ongoing  - Continue Gabapentin 300 mg TID    10. Bone Health: No acute fx   - H/o spinal cord compression &??diffuse lytic lesions t/o skeleton  - CT Chest (04/23/17) - multiple lytic lesions throughout the skeleton   - Continue Ca/Vit D &??Zometa monthly (given 02/10/18)     11. Acute on Chronic left lower back pain d/t Neoplasm: Improved w/ chemotherapy  - S/p Radiation (09/01/17 - 09/15/17, Maceo Pro)  - Continue Fentanyl patch 50 mcg/hr (increased 01/13/18, Rx 01/18/18)  - Continue Percocet 7.5/325  mg q4hrs prn    12. Steroid induced myopathy: Ongoing   - Fall (02/01/18)  - Continue PT/OT  ??  ??  DVT Prophylaxis: ??SCD's; plts < 50K  ??  Dispostion:??Once afebrile.??  ??    Jannette Spanner, MD

## 2018-02-26 NOTE — Progress Notes (Signed)
Pulmonary Followup Note    CC: pneumonia, multiple myeloma  Subjective:  Feeling better than when he came in. Frustrated by frequent pulmonary infections.    ROS:  Denies headache, nausea or chest pain.    24HR INTAKE/OUTPUT:      Intake/Output Summary (Last 24 hours) at 02/26/2018 1743  Last data filed at 02/26/2018 1452  Gross per 24 hour   Intake 1288 ml   Output 2125 ml   Net -837 ml       ??? dextromethorphan-guaiFENesin  2 tablet Oral BID   ??? fentaNYL  1 patch Transdermal Q72H   ??? amiodarone  200 mg Oral Daily   ??? atorvastatin  10 mg Oral Nightly   ??? calcium elemental  500 mg Oral BID   ??? diltiazem  240 mg Oral Daily   ??? gabapentin  300 mg Oral TID   ??? Isavuconazonium Sulfate  372 mg Oral Daily   ??? pantoprazole  40 mg Oral QAM AC   ??? phosphorus  1 tablet Oral BID   ??? pomalidomide  2 mg Oral Daily   ??? predniSONE  5 mg Oral Daily   ??? sennosides-docusate sodium  2 tablet Oral Daily   ??? sertraline  50 mg Oral Daily   ??? tiotropium  18 mcg Inhalation Daily   ??? valACYclovir  500 mg Oral BID   ??? piperacillin-tazobactam  4.5 g Intravenous Q6H   ??? sodium chloride flush  10 mL Intravenous 2 times per day   ??? Saline Mouthwash  15 mL Swish & Spit 4x Daily AC & HS   ??? budesonide  0.5 mg Nebulization BID    And   ??? formoterol  20 mcg Nebulization BID           PHYSICAL EXAMINATION:  BP 94/65    Pulse 74    Temp 97.8 ??F (36.6 ??C) (Oral)    Resp 15    Ht 5' 8"  (1.727 m)    Wt 188 lb 12.8 oz (85.6 kg)    SpO2 95%    BMI 28.71 kg/m??   CURRENT PULSE OXIMETRY:  SpO2: 95 %  24HR PULSE OXIMETRY RANGE:  SpO2  Avg: 91.3 %  Min: 84 %  Max: 96 % on ra      Gen: no distress. Speaking in full sentences without accessory muscle use  HEENT: PERRL, EOMI, OP nl  Lung:No wheezes, rales or ronchi.  No egophony or fremitus.  No accessory respiratory muscle use.   CV: RRR without M/R/R  Abd: +BS, soft, NT/ND  Ext: No edema.    DATA  CBC:   Recent Labs     02/24/18  0311 02/25/18  0315 02/26/18  0330   WBC 5.5  4.9 4.8   HGB 6.7* 7.4* 7.4*   HCT 19.8* 21.7* 21.7*   MCV 89.4 88.9 90.4   PLT 25* 21* 21*     BMP:   Recent Labs     02/24/18  0311 02/25/18  0315 02/26/18  0330   NA 133* 134* 135*   K 3.8 3.5 3.1*   CL 97* 100 100   CO2 23 23 23    PHOS  --  3.5  --    BUN 11 6* 6*   CREATININE 0.8 0.7* 0.7*     No results for input(s): PHART, PCO2ART, PO2ART in the last 72 hours.  LIVER PROFILE:   Recent Labs     02/25/18  0315   AST 11*   ALT  21   BILIDIR <0.2   BILITOT 0.3   ALKPHOS 109       CXR REVIEWED BY ME AND SHOWED:  XR CHEST STANDARD (2 VW)   Final Result   Impression: New airspace disease of the right upper lobe, and to a lesser extent the right lower lobe.           ASSESSMENT/PLAN:  This is a 62 y.o. male with RUL pneumonia and multiple myeloma    Appears to be responding to current therapy with zosyn  Previous admission had parainfluenza with significant bronchospasm, but that appears to have resolved    Continue current therapies.   Will repeat chest xray on Monday to ensure that his infiltrate isn't worsening as patient's symptoms were subclinical prior to the fever he got with transfusion.    Neville Route, MD

## 2018-02-27 LAB — BASIC METABOLIC PANEL
Anion Gap: 10 (ref 3–16)
BUN: 6 mg/dL — ABNORMAL LOW (ref 7–20)
CO2: 23 mmol/L (ref 21–32)
Calcium: 8.2 mg/dL — ABNORMAL LOW (ref 8.3–10.6)
Chloride: 103 mmol/L (ref 99–110)
Creatinine: 0.7 mg/dL — ABNORMAL LOW (ref 0.8–1.3)
GFR African American: >60 (ref >60)
GFR Non-African American: >60 (ref >60)
Glucose: 118 mg/dL — ABNORMAL HIGH (ref 70–99)
Potassium: 3.6 mmol/L (ref 3.5–5.1)
Sodium: 136 mmol/L (ref 136–145)

## 2018-02-27 LAB — CBC WITH AUTO DIFFERENTIAL
Basophils %: 0.1 %
Basophils Absolute: 0 10*3/uL (ref 0.0–0.2)
Eosinophils %: 0.3 %
Eosinophils Absolute: 0 10*3/uL (ref 0.0–0.6)
Hematocrit: 21.4 % — ABNORMAL LOW (ref 40.5–52.5)
Hemoglobin: 7.1 g/dL — ABNORMAL LOW (ref 13.5–17.5)
Lymphocytes %: 6 %
Lymphocytes Absolute: 0.3 10*3/uL — ABNORMAL LOW (ref 1.0–5.1)
MCH: 30.2 pg (ref 26.0–34.0)
MCHC: 33.4 g/dL (ref 31.0–36.0)
MCV: 90.7 fL (ref 80.0–100.0)
MPV: 8.4 fL (ref 5.0–10.5)
Monocytes %: 10.7 %
Monocytes Absolute: 0.5 10*3/uL (ref 0.0–1.3)
Neutrophils %: 82.9 %
Neutrophils Absolute: 4.1 10*3/uL (ref 1.7–7.7)
Platelets: 21 10*3/uL — ABNORMAL LOW (ref 135–450)
RBC: 2.36 M/uL — ABNORMAL LOW (ref 4.20–5.90)
RDW: 19.1 % — ABNORMAL HIGH (ref 12.4–15.4)
WBC: 4.9 10*3/uL (ref 4.0–11.0)

## 2018-02-27 MED FILL — DILTIAZEM HCL ER COATED BEADS 240 MG PO CP24: 240 mg | ORAL | Qty: 1

## 2018-02-27 MED FILL — MUCINEX DM 30-600 MG PO TB12: 30-600 mg | ORAL | Qty: 2

## 2018-02-27 MED FILL — PIPERACILLIN SOD-TAZOBACTAM SO 4.5 (4-0.5) G IV SOLR: 4.5 (4-0.5) g | INTRAVENOUS | Qty: 4.5

## 2018-02-27 MED FILL — PANTOPRAZOLE SODIUM 40 MG PO TBEC: 40 mg | ORAL | Qty: 1

## 2018-02-27 MED FILL — GABAPENTIN 300 MG PO CAPS: 300 mg | ORAL | Qty: 1

## 2018-02-27 MED FILL — SODIUM CHLORIDE 0.9 % IV SOLN: 0.9 % | INTRAVENOUS | Qty: 500

## 2018-02-27 MED FILL — SERTRALINE HCL 50 MG PO TABS: 50 mg | ORAL | Qty: 1

## 2018-02-27 MED FILL — SODIUM CHLORIDE 0.9 % IV SOLN: 0.9 % | INTRAVENOUS | Qty: 1000

## 2018-02-27 MED FILL — OXYCODONE-ACETAMINOPHEN 7.5-325 MG PO TABS: 7.5-325 mg | ORAL | Qty: 1

## 2018-02-27 MED FILL — BENZONATATE 100 MG PO CAPS: 100 mg | ORAL | Qty: 1

## 2018-02-27 MED FILL — OYSTER SHELL CALCIUM 500 MG PO TABS: 500 mg | ORAL | Qty: 1

## 2018-02-27 MED FILL — HYDROCODONE-HOMATROPINE 5-1.5 MG/5ML PO SYRP: 5-1.5 MG/5ML | ORAL | Qty: 5

## 2018-02-27 MED FILL — AMIODARONE HCL 200 MG PO TABS: 200 mg | ORAL | Qty: 1

## 2018-02-27 MED FILL — VALACYCLOVIR HCL 500 MG PO TABS: 500 mg | ORAL | Qty: 1

## 2018-02-27 MED FILL — BUDESONIDE 0.5 MG/2ML IN SUSP: 0.5 MG/2ML | RESPIRATORY_TRACT | Qty: 2

## 2018-02-27 MED FILL — FENTANYL 50 MCG/HR TD PT72: 50 ug/h | TRANSDERMAL | Qty: 1

## 2018-02-27 MED FILL — PHOSPHA 250 NEUTRAL 155-852-130 MG PO TABS: 155-852-130 mg | ORAL | Qty: 1

## 2018-02-27 MED FILL — LIPITOR 20 MG PO TABS: 20 mg | ORAL | Qty: 1

## 2018-02-27 MED FILL — PERFOROMIST 20 MCG/2ML IN NEBU: 20 MCG/2ML | RESPIRATORY_TRACT | Qty: 2

## 2018-02-27 MED FILL — PREDNISONE 5 MG PO TABS: 5 mg | ORAL | Qty: 1

## 2018-02-27 MED FILL — POTASSIUM CHLORIDE 20 MEQ/50ML IV SOLN: 20 MEQ/50ML | INTRAVENOUS | Qty: 200

## 2018-02-27 MED FILL — CRESEMBA 186 MG PO CAPS: 186 mg | ORAL | Qty: 2

## 2018-02-27 MED FILL — SENNOSIDES-DOCUSATE SODIUM 8.6-50 MG PO TABS: 8.6-50 mg | ORAL | Qty: 2

## 2018-02-27 NOTE — Plan of Care (Signed)
Problem: Falls - Risk of:  Goal: Will remain free from falls  Description  Will remain free from falls  Outcome: Met This Shift  Note:   Patient is a high risk for falls per fall assessment.  Patient is in bed, bed in low position with brake and bed alarm on, call light in reach which patient uses appropriately.  Patient remains free from falls this shift.      Problem: Pain:  Goal: Control of chronic pain  Description  Control of chronic pain  Outcome: Met This Shift  Note:   Patient medicated once this shift for pain.  See pain assessment, MAR, and response to pain intervention on flow sheet.      Problem: PROTECTIVE PRECAUTIONS  Goal: Patient will remain free of nosocomial Infections  Outcome: Met This Shift  Note:   Patient in protective precautions, in private room, on low microbial diet. And all visitors and staff compliant with hand hygiene to prevent spread of infection.      Problem: Skin Integrity:  Goal: Absence of new skin breakdown  Description  Absence of new skin breakdown  Outcome: Met This Shift  Note:   Scattered bruising noted.  No other skin breakdown seen.  Patient turns and repositions self to maintain skin integrity.      Problem: Venous Thromboembolism:  Goal: Will show no signs or symptoms of venous thromboembolism  Description  Will show no signs or symptoms of venous thromboembolism  Outcome: Met This Shift  Note:   NO signs or symptoms of DVT.      Problem: Infection - Central Venous Catheter-Associated Bloodstream Infection:  Goal: Will show no infection signs and symptoms  Description  Will show no infection signs and symptoms  Outcome: Ongoing  Note:   Patient afebrile this shift.  Antibiotics administered per MD order.  See MAR for dosages and administration times.

## 2018-02-27 NOTE — Progress Notes (Signed)
Patients gait is steady.  He is doing very well walking in his room and in the hall.  Denies weakness or dizziness.  Does use walker at times and holds onto pole in room, however, feel very confident he is safe to be off the bed alarm.  Discussed with patient and [name], both agree with plan.

## 2018-02-27 NOTE — Progress Notes (Signed)
Pulmonary Followup Note    CC: pneumonia, multiple myeloma  Subjective:  Feeling better than when he came in. More cough for my exam.  Frustrated by frequent pulmonary infections.    ROS:  Denies headache, nausea or chest pain.    24HR INTAKE/OUTPUT:      Intake/Output Summary (Last 24 hours) at 02/27/2018 1609  Last data filed at 02/27/2018 1449  Gross per 24 hour   Intake 2705 ml   Output 2400 ml   Net 305 ml       ??? dextromethorphan-guaiFENesin  2 tablet Oral BID   ??? fentaNYL  1 patch Transdermal Q72H   ??? amiodarone  200 mg Oral Daily   ??? atorvastatin  10 mg Oral Nightly   ??? calcium elemental  500 mg Oral BID   ??? diltiazem  240 mg Oral Daily   ??? gabapentin  300 mg Oral TID   ??? Isavuconazonium Sulfate  372 mg Oral Daily   ??? pantoprazole  40 mg Oral QAM AC   ??? phosphorus  1 tablet Oral BID   ??? pomalidomide  2 mg Oral Daily   ??? sennosides-docusate sodium  2 tablet Oral Daily   ??? sertraline  50 mg Oral Daily   ??? tiotropium  18 mcg Inhalation Daily   ??? valACYclovir  500 mg Oral BID   ??? piperacillin-tazobactam  4.5 g Intravenous Q6H   ??? sodium chloride flush  10 mL Intravenous 2 times per day   ??? Saline Mouthwash  15 mL Swish & Spit 4x Daily AC & HS   ??? budesonide  0.5 mg Nebulization BID    And   ??? formoterol  20 mcg Nebulization BID           PHYSICAL EXAMINATION:  BP 105/67    Pulse 91    Temp 99.2 ??F (37.3 ??C) (Oral)    Resp 21    Ht 5' 8"  (1.727 m)    Wt 184 lb 6.4 oz (83.6 kg)    SpO2 96%    BMI 28.04 kg/m??   CURRENT PULSE OXIMETRY:  SpO2: 96 %  24HR PULSE OXIMETRY RANGE:  SpO2  Avg: 96.3 %  Min: 94 %  Max: 98 % on ra      Gen: no distress. Speaking in full sentences without accessory muscle use  HEENT: PERRL, EOMI, OP nl  Lung:No wheezes, rales or ronchi.  No egophony or fremitus.  No accessory respiratory muscle use.   CV: RRR without M/R/R  Abd: +BS, soft, NT/ND  Ext: No edema.    DATA  CBC:   Recent Labs     02/25/18  0315 02/26/18  0330 02/27/18  0330   WBC 4.9 4.8 4.9    HGB 7.4* 7.4* 7.1*   HCT 21.7* 21.7* 21.4*   MCV 88.9 90.4 90.7   PLT 21* 21* 21*     BMP:   Recent Labs     02/25/18  0315 02/26/18  0330 02/27/18  0330   NA 134* 135* 136   K 3.5 3.1* 3.6   CL 100 100 103   CO2 23 23 23    PHOS 3.5  --   --    BUN 6* 6* 6*   CREATININE 0.7* 0.7* 0.7*     No results for input(s): PHART, PCO2ART, PO2ART in the last 72 hours.  LIVER PROFILE:   Recent Labs     02/25/18  0315   AST 11*   ALT 21  BILIDIR <0.2   BILITOT 0.3   ALKPHOS 109       CXR REVIEWED BY ME AND SHOWED:  XR CHEST STANDARD (2 VW)   Final Result   Impression: New airspace disease of the right upper lobe, and to a lesser extent the right lower lobe.      XR CHEST PORTABLE    (Results Pending)        ASSESSMENT/PLAN:  This is a 62 y.o. male with RUL pneumonia and multiple myeloma    Appears to be responding to current therapy with zosyn  Continue current therapies.   Repeat chest xray tomorrow    Neville Route, MD

## 2018-02-27 NOTE — Progress Notes (Signed)
Patient placed back on bed/chair alarm per charge nurse request.  Patient educated on change.

## 2018-02-27 NOTE — Progress Notes (Signed)
[facility] Progress Note    02/27/2018     David Watkins    MRN: [id number]    DOB: [birth year]    Referring MD: [place] [address]      SUBJECTIVE:  Cough improved.  Denies SOB or pain.  Able to walk, encouraged to do so.  Overall improving    ECOG PS:  (2) Ambulatory and capable of self care, unable to carry out work activity, up and about > 50% or waking hours    Isolation: None    Medications    Scheduled Meds:  ??? dextromethorphan-guaiFENesin  2 tablet Oral BID   ??? fentaNYL  1 patch Transdermal Q72H   ??? amiodarone  200 mg Oral Daily   ??? atorvastatin  10 mg Oral Nightly   ??? calcium elemental  500 mg Oral BID   ??? diltiazem  240 mg Oral Daily   ??? gabapentin  300 mg Oral TID   ??? Isavuconazonium Sulfate  372 mg Oral Daily   ??? pantoprazole  40 mg Oral QAM AC   ??? phosphorus  1 tablet Oral BID   ??? pomalidomide  2 mg Oral Daily   ??? sennosides-docusate sodium  2 tablet Oral Daily   ??? sertraline  50 mg Oral Daily   ??? tiotropium  18 mcg Inhalation Daily   ??? valACYclovir  500 mg Oral BID   ??? piperacillin-tazobactam  4.5 g Intravenous Q6H   ??? sodium chloride flush  10 mL Intravenous 2 times per day   ??? Saline Mouthwash  15 mL Swish & Spit 4x Daily AC & HS   ??? budesonide  0.5 mg Nebulization BID    And   ??? formoterol  20 mcg Nebulization BID     Continuous Infusions:  ??? sodium chloride 50 mL/hr at 02/26/18 1419   ??? sodium chloride       PRN Meds:.magnesium sulfate, potassium chloride, benzonatate, HYDROcodone-homatropine, albuterol, bisacodyl, diphenhydrAMINE, lidocaine-prilocaine, ondansetron, oxyCODONE-acetaminophen, prochlorperazine, sodium chloride, sodium chloride flush, potassium chloride, magnesium sulfate, magnesium hydroxide, Saline Mouthwash, alteplase, acetaminophen    ROS:  As noted above, otherwise remainder of 10-point ROS negative    Physical Exam:     I&O:      Intake/Output Summary (Last 24 hours) at 02/27/2018 1040  Last data filed at 02/27/2018 0630  Gross  per 24 hour   Intake 2345 ml   Output 2775 ml   Net -430 ml       Vital Signs:  BP 94/79    Pulse 83    Temp 98.2 ??F (36.8 ??C) (Oral)    Resp 12    Ht 5' 8"  (1.727 m)    Wt 184 lb 6.4 oz (83.6 kg)    SpO2 98%    BMI 28.04 kg/m??     Weight:    Wt Readings from Last 3 Encounters:   02/27/18 184 lb 6.4 oz (83.6 kg)   02/21/18 187 lb (84.8 kg)   02/08/18 193 lb 12.6 oz (87.9 kg)         General: Awake, alert and oriented.  HEENT: normocephalic, PERRL, no scleral erythema or icterus, Oral mucosa moist and intact, throat clear  NECK: supple without palpable adenopathy  BACK: Straight negative CVAT  SKIN: warm dry and intact without lesions rashes or masses  CHEST: CTA bilaterally without use of accessory muscles  CV: Normal S1 S2, RRR, no MRG  ABD: NT ND normoactive  BS, no palpable masses or hepatosplenomegaly  EXTREMITIES: without edema, denies calf tenderness  NEURO: CN II - XII grossly intact  CATHETER: Port in R chest wall.    Data    CBC:   Recent Labs     02/25/18  0315 02/26/18  0330 02/27/18  0330   WBC 4.9 4.8 4.9   HGB 7.4* 7.4* 7.1*   HCT 21.7* 21.7* 21.4*   MCV 88.9 90.4 90.7   PLT 21* 21* 21*     BMP/Mag:  Recent Labs     02/25/18  0315 02/26/18  0330 02/27/18  0330   NA 134* 135* 136   K 3.5 3.1* 3.6   CL 100 100 103   CO2 23 23 23    PHOS 3.5  --   --    BUN 6* 6* 6*   CREATININE 0.7* 0.7* 0.7*     LIVP:   Recent Labs     02/25/18  0315   AST 11*   ALT 21   BILIDIR <0.2   BILITOT 0.3   ALKPHOS 109     Coags:   No results for input(s): PROTIME, INR, APTT in the last 72 hours.  Uric Acid   Recent Labs     02/25/18  0315   LABURIC 1.2*     Diagnostics:  1. ??PET scan (01/10/18):    ??  Myeloma Labs (01/03/18):  SPEP:????reveals that M2, previously characterized as monoclonal IgG kappa in  mid-to-slow gamma is 1.0 gm/dL, greater than the 0.2 gm/dL observed on 08 Nov 2017.??  SIFE:????Serum immunofixation electrophoresis reveals persistent M2 in mid-to-slow gamma, a monoclonal IgG kappa. ??A recent M-spike, M1, was minor  monoclonal IgG lambda in mid-gamma; M1 continues to be absent.  SFLC:  Kappa:  30.80, previously (11/08/17) -??10.40  Lambda:  1.46, previously (11/08/17) -??5.97  Free Kappa Lambda Ratio:  21.10, previously (11/08/17) -??1.74  Quantitative Immunoglobulins:  IgG:??  1480, previously (11/08/17) - 552  IgA:??  <26, previously (11/08/17) -??30  IgM:??  <20  ??  PROBLEM LIST: ??????????   ????  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 ??COPD  9.????Influenza A??(10/12/17)  10. H/o Acute pericarditis  11. PAF w/RVR  ????  TREATMENT: ??????????   ??  1. Rad Tx to T5-7, T10-L3, Right Scapula - 3000 cGy - Dr. Jamas Lav 03/20/16-04/01/16  2. RVD x1 03/20/16 - discontinued d/t rash   3. Velcade/Pomalyst/Dex x3 cycles 04/23/16-06/17/16 - discontinued d/t reaction to pomalyst  4. Velcade/Dex x 1 cycle 06/26/16 (last dose of dexamethasone 07/22/16  5. High-dose melphalan followed by administration of PBSCs 2.36 x10^6 cd34cells/kg on 08/28/16  6. Maintenance Revlimid 71m daily (12/2016-04/23/17)  7. Dexamethasone 42mdaily (04/24/17)  8. DCEP   Cycle #1 - 04/26/17  Cycle #2 - 05/25/17 - excellent response  9.??Dara/Velcade/Dex (C1D1 - 06/22/17) - BMBx ??10/18/17 - CR  Cycles 7-8 (21 day cycle)  - Dex 20 mg po D4,5.8,9,11 and 12  - Dara 16 mg/Kg D1 only with Dex 20 mg IV  - Velcade 1.3 mg/M2 D1,4,8 and 11  Cycle 9 and beyond (maintenance, 11/08/17??- 01/03/18)  Dara 16 mg/Kg Q 28 days  Dex 12 mg IV D1  Velcade Q 2 weeks??  10. DCEP 01/13/18  11. Elo/Pom/Dex - C1D1 02/18/18  ??  ASSESSMENT AND PLAN:??????????   ??  1. IgG kappa multiple  myeloma / Plasma Cell Leukemia:??Relapsed disease   - S/p treatment Dara/Velcade/Dex x 8 cycles (06/22/17 - 10/2017). Followed by maintenance Dara Q4 wks/Velcade Q2ks/Dex (started 11/08/17)  - Progressive disease based on myeloma labs (01/03/18)  &??PET scan (01/10/18), see above   - S/p 1 cycle DCEP     PLAN: Elo/Pom 2 mg/Dex (hold pred on dex days) (C1D1 02/18/18). Hope for allogeneic transplant in CR2, but needs to disease response and improvement in lung function   - M-spike (01/27/18) - 0.5 g/dL, previously 1.0 g/dL  - MMP 02/18/18: M-spike 0.5gm/dL (decreased from 1 12/2017). SFLC ratio 4.95  ??  Elo/Pom/Dex  Cycle 1 Day +9    2. ID:??Admitted with fever up to 102.4 as outpt 2/2 RUL bacterial PNA. Afebrile since admission  - Fungitell and galactomannan (01/25/18) -??Positive; Bronch / Diatherix (02/03/18) - parainfluenza virus, Acinetobacter baumanni, Adenovirus -- S/p Meropenem Day x 19 days  - Consulted ID: No need to cont abx for Acinetobacter PNA, but cont antifungal for tree in bud on CT chest  - Continue Valtrex ppx  - Cont??Cresemba for fungal PNA. Cont to monitor fungitel weekly:   - 5/28 - + 403   - 6/28 -  201  - Blood Cxs 6/26 - NGTD  - CXR 02/23/18: RUL pneumonia  - Cont Zoysn Day +5 (02/23/18)??    3. Heme:??Chemotherapy induced anemia and thrombocytopenia   - Transfuse for Hgb <??8 (severe COPD) and Platelets <??20K  -No transfusion today    4. Metabolic: HypoNa, otherwise electrolytes & renal fxn stable  - Cont KPhos 1 tablet BID (started 02/04/18)  - Keep Mg > 2 and K+ > 4.0??  - Cont IVFs: NS at 56m/hr    5. Pulmonary: H/o severe COPD, Continue treatment for possible fungal PNA.   Imaging:  - Pulm Nodule: Stable on CT chest since 04/23/17; cont to monitor.   - CTPA (01/22/18): No PE, patchy groundglass opacities and tree-in-bud are seen throughout the lungs which are new compared to prior examination.   - CT chest (01/31/18): Stable innumerable pulmonary nodules bilaterally favoring metastatic disease.  - PFT (12/27/17): compared to 07/2016: FVC 3.25 L, 66%, and FEV1  1.42 L, 38%. FEV1/FVC is 44%. This is unchanged from prior study. Air trapping seen on lung volumes. Diffusion capacity 3.91, 79% of predicted, down from 5.21     Tx:  - Cont Steroid  taper:??Solumedrol??80 mg TID (started 01/22/18), 40 mg TID (01/25/18), 40 mg bid (01/26/18), decrease/change Pred 25 mg bid (02/03/18),??40 mg daily x 5 days, 20 mg daily x 5 days (begin 02/16/18), 10 mg daily x 5 days, 5 mg daily x 5 days, then stop (last dose 02/27/18)  -??Will be followed off prednisone receiving only the Decadron pulses as part of his chemotherapy   - COPD: Cont??Spiriva and Breo  - Cont ID tx as above  - Consult Dr. BDorann Lodge- appreciate recs  - ? Amiodarone pulmonary toxicity contributing to pulmonary symptoms    6. GI / Nutrition: Appetite and oral intake is fair  - Continue PPI ppx w/ steroids   - Continue low microbial diet   Constipation: Improved  - Continue Dulcolax prn &??Senokot-s 2 tabs daily     7. 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 Psych, Anxiety & Depression: Ongoing concerns about disease progression and ability to get transplant   - Continue Zoloft 50 mg daily   - Psych to follow as needed once outpt    9. Peripheral Neuropathy:??Ongoing  - Continue Gabapentin 300 mg TID    10. Bone Health: No acute fx   - H/o spinal cord compression &??diffuse lytic lesions t/o skeleton  - CT Chest (04/23/17) - multiple lytic lesions throughout the skeleton   - Continue Ca/Vit D &??Zometa monthly (given 02/10/18)     11. 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  ??  DVT Prophylaxis: ??SCD's; plts < 50K  ??  Dispostion:??Once  afebrile.??  ??    Jannette Spanner, MD

## 2018-02-27 NOTE — Plan of Care (Signed)
Problem: Bleeding:  Goal: Will show no signs and symptoms of excessive bleeding  Description  Will show no signs and symptoms of excessive bleeding  Outcome: Ongoing     Problem: Infection - Central Venous Catheter-Associated Bloodstream Infection:  Goal: Will show no infection signs and symptoms  Description  Will show no infection signs and symptoms  Outcome: Ongoing     Problem: Falls - Risk of:  Goal: Will remain free from falls  Description  Will remain free from falls  Outcome: Ongoing  Goal: Absence of physical injury  Description  Absence of physical injury  Outcome: Ongoing     Problem: Nutrition Deficit:  Goal: Ability to achieve adequate nutritional intake will improve  Description  Ability to achieve adequate nutritional intake will improve  Outcome: Ongoing

## 2018-02-28 ENCOUNTER — Inpatient Hospital Stay: Admit: 2018-02-28 | Payer: BLUE CROSS/BLUE SHIELD | Primary: Hematology & Oncology

## 2018-02-28 LAB — BASIC METABOLIC PANEL
Anion Gap: 12 (ref 3–16)
BUN: 6 mg/dL — ABNORMAL LOW (ref 7–20)
CO2: 20 mmol/L — ABNORMAL LOW (ref 21–32)
Calcium: 8.6 mg/dL (ref 8.3–10.6)
Chloride: 105 mmol/L (ref 99–110)
Creatinine: 0.6 mg/dL — ABNORMAL LOW (ref 0.8–1.3)
GFR African American: >60 (ref >60)
GFR Non-African American: >60 (ref >60)
Glucose: 165 mg/dL — ABNORMAL HIGH (ref 70–99)
Potassium: 3.8 mmol/L (ref 3.5–5.1)
Sodium: 137 mmol/L (ref 136–145)

## 2018-02-28 LAB — CBC WITH AUTO DIFFERENTIAL
Basophils %: 0.2 %
Basophils Absolute: 0 10*3/uL (ref 0.0–0.2)
Eosinophils %: 0.1 %
Eosinophils Absolute: 0 10*3/uL (ref 0.0–0.6)
Hematocrit: 21.1 % — ABNORMAL LOW (ref 40.5–52.5)
Hemoglobin: 7.1 g/dL — ABNORMAL LOW (ref 13.5–17.5)
Lymphocytes %: 7.9 %
Lymphocytes Absolute: 0.4 10*3/uL — ABNORMAL LOW (ref 1.0–5.1)
MCH: 30.6 pg (ref 26.0–34.0)
MCHC: 33.7 g/dL (ref 31.0–36.0)
MCV: 90.7 fL (ref 80.0–100.0)
MPV: 8.9 fL (ref 5.0–10.5)
Monocytes %: 11.5 %
Monocytes Absolute: 0.6 10*3/uL (ref 0.0–1.3)
Neutrophils %: 80.3 %
Neutrophils Absolute: 4.4 10*3/uL (ref 1.7–7.7)
Platelets: 22 10*3/uL — ABNORMAL LOW (ref 135–450)
RBC: 2.33 M/uL — ABNORMAL LOW (ref 4.20–5.90)
RDW: 19.7 % — ABNORMAL HIGH (ref 12.4–15.4)
WBC: 5.5 10*3/uL (ref 4.0–11.0)

## 2018-02-28 LAB — PROTIME-INR
INR: 1.25 — ABNORMAL HIGH (ref 0.86–1.14)
Protime: 14.2 s — ABNORMAL HIGH (ref 9.8–13.0)

## 2018-02-28 LAB — URINALYSIS
Bilirubin Urine: NEGATIVE
Blood, Urine: NEGATIVE
Glucose, Ur: NEGATIVE mg/dL
Ketones, Urine: NEGATIVE mg/dL
Leukocyte Esterase, Urine: NEGATIVE
Nitrite, Urine: NEGATIVE
Specific Gravity, UA: 1.01 (ref 1.005–1.030)
Urobilinogen, Urine: 0.2 E.U./dL (ref <2.0)
pH, UA: 6.5 (ref 5.0–8.0)

## 2018-02-28 LAB — PHOSPHORUS: Phosphorus: 2.8 mg/dL (ref 2.5–4.9)

## 2018-02-28 LAB — DAT IGG: DAT IgG: NEGATIVE

## 2018-02-28 LAB — LACTATE DEHYDROGENASE: LD: 210 U/L — ABNORMAL HIGH (ref 100–190)

## 2018-02-28 LAB — CULTURE, BLOOD 2: Culture, Blood 2: NO GROWTH

## 2018-02-28 LAB — HEPATIC FUNCTION PANEL
ALT: 15 U/L (ref 10–40)
AST: 10 U/L — ABNORMAL LOW (ref 15–37)
Albumin: 2.6 g/dL — ABNORMAL LOW (ref 3.4–5.0)
Alkaline Phosphatase: 90 U/L (ref 40–129)
Bilirubin, Direct: <0.2 mg/dL (ref 0.0–0.3)
Total Bilirubin: <0.2 mg/dL (ref 0.0–1.0)
Total Protein: 6.4 g/dL (ref 6.4–8.2)

## 2018-02-28 LAB — CULTURE, VIRUS, RESPIRATORY

## 2018-02-28 LAB — ANTIBODY IDENTIFICATION: Antibody ID: NEGATIVE

## 2018-02-28 LAB — CULTURE BLOOD #1: Blood Culture, Routine: NO GROWTH

## 2018-02-28 LAB — MICROSCOPIC URINALYSIS: WBC, UA: NONE SEEN /HPF (ref 0–5)

## 2018-02-28 LAB — TYPE AND SCREEN: ABO/Rh: O POS

## 2018-02-28 LAB — URIC ACID: Uric Acid: 1.2 mg/dL — ABNORMAL LOW (ref 3.5–7.2)

## 2018-02-28 LAB — MAGNESIUM: Magnesium: 1.8 mg/dL (ref 1.80–2.40)

## 2018-02-28 LAB — ANTIBODY SCREEN: Antibody Screen: POSITIVE

## 2018-02-28 LAB — APTT: aPTT: 28.6 s (ref 26.0–36.0)

## 2018-02-28 MED ORDER — HYDROCODONE-HOMATROPINE 5-1.5 MG/5ML PO SYRP
ORAL | 0 refills | Status: AC | PRN
Start: 2018-02-28 — End: 2018-03-03

## 2018-02-28 MED ORDER — DM-GUAIFENESIN ER 30-600 MG PO TB12
30-600 MG | ORAL_TABLET | Freq: Two times a day (BID) | ORAL | 0 refills | Status: AC
Start: 2018-02-28 — End: 2018-03-10

## 2018-02-28 MED ORDER — BENZOCAINE-MENTHOL 15-3.6 MG MT LOZG
LOZENGE | OROMUCOSAL | Status: AC | PRN
Start: 2018-02-28 — End: ?

## 2018-02-28 MED ORDER — BENZOCAINE-MENTHOL 15-3.6 MG MT LOZG
OROMUCOSAL | Status: DC | PRN
Start: 2018-02-28 — End: 2018-02-28

## 2018-02-28 MED ORDER — HEPARIN SOD (PORK) LOCK FLUSH 100 UNIT/ML IV SOLN
100 UNIT/ML | Freq: Once | INTRAVENOUS | Status: AC
Start: 2018-02-28 — End: 2018-02-28
  Administered 2018-02-28: 17:00:00 500 [IU]

## 2018-02-28 MED ORDER — BENZONATATE 100 MG PO CAPS
100 MG | ORAL_CAPSULE | Freq: Three times a day (TID) | ORAL | 0 refills | Status: AC | PRN
Start: 2018-02-28 — End: 2018-03-07

## 2018-02-28 MED ORDER — CIPROFLOXACIN HCL 500 MG PO TABS
500 MG | ORAL_TABLET | Freq: Two times a day (BID) | ORAL | 0 refills | Status: AC
Start: 2018-02-28 — End: 2018-03-08

## 2018-02-28 MED FILL — CRESEMBA 186 MG PO CAPS: 186 mg | ORAL | Qty: 2

## 2018-02-28 MED FILL — VALACYCLOVIR HCL 500 MG PO TABS: 500 mg | ORAL | Qty: 1

## 2018-02-28 MED FILL — OYSTER SHELL CALCIUM 500 MG PO TABS: 500 mg | ORAL | Qty: 1

## 2018-02-28 MED FILL — PIPERACILLIN SOD-TAZOBACTAM SO 4.5 (4-0.5) G IV SOLR: 4.5 (4-0.5) g | INTRAVENOUS | Qty: 4.5

## 2018-02-28 MED FILL — PHOSPHA 250 NEUTRAL 155-852-130 MG PO TABS: 155-852-130 mg | ORAL | Qty: 1

## 2018-02-28 MED FILL — AMIODARONE HCL 200 MG PO TABS: 200 mg | ORAL | Qty: 1

## 2018-02-28 MED FILL — MUCINEX DM 30-600 MG PO TB12: 30-600 mg | ORAL | Qty: 2

## 2018-02-28 MED FILL — PERFOROMIST 20 MCG/2ML IN NEBU: 20 MCG/2ML | RESPIRATORY_TRACT | Qty: 2

## 2018-02-28 MED FILL — HYDROCODONE-HOMATROPINE 5-1.5 MG/5ML PO SYRP: 5-1.5 MG/5ML | ORAL | Qty: 5

## 2018-02-28 MED FILL — PANTOPRAZOLE SODIUM 40 MG PO TBEC: 40 mg | ORAL | Qty: 1

## 2018-02-28 MED FILL — BUDESONIDE 0.5 MG/2ML IN SUSP: 0.5 MG/2ML | RESPIRATORY_TRACT | Qty: 2

## 2018-02-28 MED FILL — FENTANYL 50 MCG/HR TD PT72: 50 ug/h | TRANSDERMAL | Qty: 1

## 2018-02-28 MED FILL — POTASSIUM CHLORIDE 20 MEQ/50ML IV SOLN: 20 MEQ/50ML | INTRAVENOUS | Qty: 50

## 2018-02-28 MED FILL — CEPACOL SORE THROAT EX ST 15-3.6 MG MT LOZG: 15-3.6 mg | OROMUCOSAL | Qty: 1

## 2018-02-28 MED FILL — SENNOSIDES-DOCUSATE SODIUM 8.6-50 MG PO TABS: 8.6-50 mg | ORAL | Qty: 2

## 2018-02-28 MED FILL — SERTRALINE HCL 50 MG PO TABS: 50 mg | ORAL | Qty: 1

## 2018-02-28 MED FILL — DILTIAZEM HCL ER COATED BEADS 240 MG PO CP24: 240 mg | ORAL | Qty: 1

## 2018-02-28 MED FILL — BENZONATATE 100 MG PO CAPS: 100 mg | ORAL | Qty: 1

## 2018-02-28 MED FILL — LIPITOR 20 MG PO TABS: 20 mg | ORAL | Qty: 1

## 2018-02-28 MED FILL — GABAPENTIN 300 MG PO CAPS: 300 mg | ORAL | Qty: 1

## 2018-02-28 MED FILL — HEPARIN LOCK FLUSH 100 UNIT/ML IV SOLN: 100 [IU]/mL | INTRAVENOUS | Qty: 5

## 2018-02-28 MED FILL — OXYCODONE-ACETAMINOPHEN 7.5-325 MG PO TABS: 7.5-325 mg | ORAL | Qty: 1

## 2018-02-28 MED FILL — MAGNESIUM SULFATE 2 GM/50ML IV SOLN: 2 GM/50ML | INTRAVENOUS | Qty: 50

## 2018-02-28 NOTE — Discharge Instructions (Addendum)
Continuity of Care Form    Patient Name: David Watkins   DOB:  06-28-1956  MRN:  [id number]    Admit date:  02/23/2018  Discharge date:  ***    Code Status Order: Full Code   Advance Directives:   Advance Care Flowsheet Documentation     Date/Time Healthcare Directive Type of Healthcare Directive Copy in [place] Agent's Name Healthcare Agent's Phone Number    02/23/18 1811  Yes, patient has an advance directive for healthcare treatment  Durable power of attorney for health care;Living will  No, copy requested from family  Adult siblings  Darvell Monteforte  [phone]          Admitting Physician:  Harlene Salts, MD  PCP: Harlene Salts, MD    Discharging Nurse: [facility] Unit/Room#: 3518/3518-01  Discharging Unit Phone Number: [phone]    Emergency Contact:   Extended Emergency Contact Information  Primary Emergency Contact: [place] Phone: [phone]  Relation: Domestic Partner  Secondary Emergency Contact: [place],jiles  Home Phone: [phone]  Mobile Phone: [phone]  Relation: Brother/Sister    Past Surgical History:  Past Surgical History:   Procedure Laterality Date   ??? BONE MARROW BIOPSY     ??? BONE MARROW TRANSPLANT     ??? BRONCHOSCOPY N/A 02/03/2018    BRONCHOSCOPY WITH BAL, CHOICE ANESTHESIA performed by Neville Route, MD at [facility]   ??? BRONCHOSCOPY  02/03/2018    BRONCHOSCOPY THERAPUTIC ASPIRATION SUBSEQUENT performed by Neville Route, MD at [facility]   ??? ENDOSCOPY, COLON, DIAGNOSTIC     ??? OTHER SURGICAL HISTORY Left 08/17/2016    trifusion cath placement   ??? PRE-MALIGNANT / BENIGN SKIN LESION EXCISION Left 03/29/2017    EXCISE LESION LEFT EXTERNAL EAR WITH FROZEN SECTION, FULL THICKNESS SKIN GRAFT   ??? SKIN BIOPSY      left ear       Immunization History:   Immunization History   Administered Date(s) Administered   ??? Hepatitis B 02/26/2017   ??? Influenza Virus Vaccine 06/01/2016   ??? Meningococcal  MCV4P (Menactra) 02/26/2017   ??? Pneumococcal Conjugate 13-valent (Prevnar13) 02/26/2017   ??? Polio IPV (IPOL) 02/26/2017   ??? Tdap (Boostrix, Adacel) 02/26/2017       Active Problems:  Patient Active Problem List   Diagnosis Code   ??? Multiple myeloma not having achieved remission ([place]) C90.00   ??? COPD, severe ([place]) J44.9   ??? Multiple myeloma in remission ([place]) C90.01   ??? Moderate malnutrition (HCC) E44.0   ??? Open wound of left ear S01.302A   ??? Ear lesion H93.90   ??? Skin lesion L98.9   ??? 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 Multiple myeloma and immunoproliferative neoplasms (HCC) C90.00, C88.9   ??? COPD exacerbation (HCC) J44.1   ??? Pulmonary nodule R91.1   ??? Pneumonia J18.9   ??? Acute respiratory failure with hypoxia and hypercapnia (HCC) J96.01, J96.02   ??? Neutropenia associated with infection (HCC) D70.3   ??? Paroxysmal atrial fibrillation (HCC) I48.0   ??? Mixed hyperlipidemia E78.2   ??? Acute pericarditis I30.9   ??? Atypical pneumonia J18.9   ??? Multiple myeloma in relapse (HCC) C90.02       Isolation/Infection:   Isolation          No Isolation            Nurse Assessment:  Last Vital Signs: BP 103/65    Pulse 83    Temp 98.5 ??F (36.9 ??C) (Oral)    Resp 14    Ht 5' 8"  (1.727 m)    Wt 182 lb 6.4 oz (82.7 kg)    SpO2 93%    BMI 27.73 kg/m??     Last documented pain score (0-10 scale): Pain Level: 0  Last Weight:   Wt Readings from Last 1 Encounters:   02/28/18 182 lb 6.4 oz (82.7 kg)     Mental Status:  {IP PT MENTAL STATUS:20030}    IV Access:  [place] COC IV ACCESS:[id number]}    Nursing Mobility/ADLs:  Walking   Independent  Transfer  Assisted  Bathing  Assisted  Dressing  Independent  [place]  Independent  Med Delivery   whole    Wound Care Documentation and Therapy:        Elimination:  Continence:   ?? Bowel: Yes  ?? Bladder: Yes  Urinary Catheter: None   Colostomy/Ileostomy/Ileal Conduit: No       Date of Last BM: 02/27/18    Intake/Output Summary (Last 24  hours) at 02/28/2018 1146  Last data filed at 02/28/2018 0902  Gross per 24 hour   Intake 1830 ml   Output 2950 ml   Net -1120 ml     I/O last 3 completed shifts:  In: 1950 [P.O.:1200; I.V.:550; IV Piggyback:200]  Out: 2800 [Urine:2800]    Safety Concerns:     Risk for Falls     Impairments/Disabilities:      None    Nutrition Therapy:  Current Nutrition Therapy:   - Oral Diet:  General    Routes of Feeding: Oral  Liquids: No Restrictions  Daily Fluid Restriction: no  Last Modified Barium Swallow with Video (Video Swallowing Test): not done    Treatments at the Time of Hospital Discharge:   Respiratory Treatments: ***  Oxygen Therapy:  is not on home oxygen therapy.  Ventilator:    - No ventilator support    Rehab Therapies: Physical Therapy and Occupational Therapy  Weight Bearing Status/Restrictions: No weight bearing restirctions  Other Medical Equipment (for information only, NOT a DME order):  {EQUIPMENT:304520077}  Other Treatments: NA    Patient's personal belongings (please select all that are sent with patient):  Glasses, Jewelry    RN SIGNATURE:  Electronically signed by Earl Lagos, RN on 02/28/18 at 1:00 PM    CASE MANAGEMENT/SOCIAL WORK SECTION    Inpatient Status Date: ***    Readmission Risk Assessment Score:  Readmission Risk              Risk of Unplanned Readmission:        44           Discharging to Facility/ Agency   ?? Name:   ?? Address:  ?? Phone:  ?? Fax:    Dialysis Facility (if applicable)   ?? Name:  ?? Address:  ?? Dialysis Schedule:  ?? Phone:  ?? Fax:    Case Manager/Social Worker signature: {Esignature:[id number]}    PHYSICIAN SECTION    Prognosis: {[phone]}    Condition at Discharge: [place] Patient Condition:304088024}    Rehab Potential (if transferring to Rehab): {[phone]}    Recommended Labs or Other Treatments After Discharge: ***    Physician Certification: I certify the above information and transfer of BALEN WOOLUM  is necessary for the continuing treatment of  the  diagnosis listed and that he requires {Admit to Appropriate Level of Care:20763} for {GREATER/LESS:304500278} 30 days.     Update Admission H&P: {CHP DME Changes in IZTIW:580998338}    PHYSICIAN SIGNATURE:  {Esignature:304088025}

## 2018-02-28 NOTE — Care Coordination-Inpatient (Signed)
[facility]    Patient aware and agreeable to services. Faxed orders to [facility] for [facility]    Jerrod Damiano, LPN  Care Transition Nurse  [facility]  [phone]

## 2018-02-28 NOTE — Care Coordination-Inpatient (Signed)
Social Work Discharge Note    Patient is being discharged today.  SO is providing transportation.  [facility] to see for PT/OT, faxed orders.    No further SW intervention needed.    Freddi Starr, MSW, [place]

## 2018-02-28 NOTE — Progress Notes (Signed)
Reviewed discharge instructions with patient and  caregiver.  Reviewed discharge medications including dosing, schedule, indication, and adverse reactions.  Reviewed which medications were already taken today and next dosage due for each medication.      Reviewed signs and symptoms that prompt a call to the physician and appropriate phone numbers. Purple ER card given to the patient with explanations of its use.  Reviewed follow up appointments that have been made in [facility] and Outpatient Oncology.  Low microbial diet, activity restrictions, and increased risk of infection were reviewed.     Patient is being discharged with IV access d/t need for ongoing therapy:      Type:  PAC    Plan:continue   CVC care and maintenance was reviewed with patient and caregiver.  Pt verbalizes understanding of line care and maintenance.      Patient verbalized understanding of all instructions and questions were answered to his. satisfaction.  Signed discharge instructions were given to the patient and a copy placed in the paper-lite chart.  Patient discharged to home per self with caregiver.      Earl Lagos

## 2018-02-28 NOTE — Discharge Summary (Signed)
[facility] Discharge Summary             Attending Physician: David Salts, MD    Referring MD: David Salts, MD  [place] [address]    Name: David Watkins DOB:  10-10-1955  MRN:  [id number]    Admission: 02/23/2018   Discharge:  02/28/18     Date: 02/28/2018    Diagnosis on admit: Right Upper Lobe Pneumonia      Consultations:   1. Pulmonology: David Watkins    Medications:    David Watkins, David Watkins   Home Medication Instructions SNK:[id number]    Printed on:02/28/18 1130   Medication Information                      albuterol sulfate HFA (PROAIR HFA) 108 (90 Base) MCG/ACT inhaler  Inhale 2 puffs into the lungs every 6 hours as needed for Wheezing             amiodarone (CORDARONE) 200 MG tablet  Take 1 tablet by mouth daily             atorvastatin (LIPITOR) 10 MG tablet  Take 10 mg by mouth nightly              benzocaine-menthol (CEPACOL SORE THROAT) 15-3.6 MG lozenge  Take 1 lozenge by mouth every 2 hours as needed for Sore Throat             benzonatate (TESSALON) 100 MG capsule  Take 1 capsule by mouth 3 times daily as needed for Cough             bisacodyl (DULCOLAX) 5 MG EC tablet  Take 2 tablets by mouth daily as needed for Constipation             calcium carbonate (OSCAL) 500 MG TABS tablet  Take 500 mg by mouth 2 times daily              ciprofloxacin (CIPRO) 500 MG tablet  Take 1 tablet by mouth 2 times daily for 8 days             dextromethorphan-guaiFENesin (MUCINEX DM) 30-600 MG per extended release tablet  Take 2 tablets by mouth 2 times daily for 10 days             diltiazem (CARDIZEM CD) 240 MG extended release capsule  Take 1 capsule by mouth daily             diphenhydrAMINE (BENADRYL) 25 MG tablet  Take 1 tablet by mouth as needed (30 minutes prior to platelets)             fentaNYL (DURAGESIC) 50 MCG/HR  Place 1 patch onto the skin every 72 hours.             Fluticasone Furoate-Vilanterol (BREO ELLIPTA) 200-25 MCG/INH AEPB  Inhale into the lungs daily              gabapentin (NEURONTIN) 300 MG capsule  Take 300 mg by mouth 3 times daily. Indications: 1 TAB AM 1 TAB AFTERNOON AND 2 AT PM             HYDROcodone-homatropine (HYCODAN) 5-1.5 MG/5ML syrup  Take 5 mLs by mouth every 4 hours as needed (Cough) for up to 3 days.             Isavuconazonium Sulfate 186 MG CAPS  Take 372 mg by mouth daily  Take 2 caps 3 times daily x 6 doses, then take daily             lidocaine-prilocaine (EMLA) 2.5-2.5 % cream  Apply topically as needed for Pain Apply topically as needed.             ondansetron (ZOFRAN) 4 MG tablet  Take 1 tablet by mouth every 6 hours as needed for Nausea or Vomiting             oxyCODONE-acetaminophen (PERCOCET) 7.5-325 MG per tablet  Take 1 tablet by mouth every 4 hours as needed for Pain..             pantoprazole (PROTONIX) 40 MG tablet  Take 1 tablet by mouth every morning (before breakfast)             phosphorus (K PHOS NEUTRAL) 155-852-130 MG tablet  Take 2 tablets by mouth 2 times daily             pomalidomide (POMALYST) 2 MG chemo capsule  Take 2 mg by mouth daily             prochlorperazine (COMPAZINE) 10 MG tablet  Take 1 tablet by mouth every 6 hours as needed (Nausea)             sennosides-docusate sodium (SENOKOT-S) 8.6-50 MG tablet  Take 2 tablets by mouth daily             sertraline (ZOLOFT) 50 MG tablet  Take 50 mg by mouth daily             tiotropium (SPIRIVA HANDIHALER) 18 MCG inhalation capsule  Inhale 1 capsule into the lungs daily             valACYclovir (VALTREX) 500 MG tablet  Take 500 mg by mouth 2 times daily                 Reason for Admission: Fever in immunocompromised patient w/RUL Pneumonia    Past Medical History:  David Watkins is a??62??yo male??w/ 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 ??His PMH is also significant for ??HTN, HLD, anxiety/depression, COPD &??peripheral neuropathy. He is most recently been treated Elo/Pom/Dex, last tx 02/18/18.??He??is followed closely by??Dr. Dorann Lodge for COPD and recently been  on a steroid taper for COPD exacerbation.   ??  He presented (01/22/18)??to the ED w/ chest pain and shortness of breath despite starting Prednisone early that day and using his home inhalers. ??There were concerns for MI d/t EKG changes and elevated troponin and the cath lab was activated. ??He underwent echocardiogram (01/23/18) that showed a LVEF 50-55%??w/ normal diastolic fxn &??no obvious regional wall motion abnormalities??and a possible small/organized pericardial effusion. ??He also underwent CTA chest (01/22/18) that showed no PE, patchy groundglass opacities and tree-in-bud??changes??throughout the??lungs which??were??new compared to prior examination.????He was evaluated by Dr. Bunnie Philips who believed his chest pain was likely from acute pericarditis and not an MI, so he did not go to the cath lab for angiography. ??Pulmonary was consulted and believed he had COPD exacerbation as well contributing to shortness of breath. ??He was started on Solumedrol 80 mg TID for management of COPD and pericarditis. ??He also started Celebrex since other NSAIDs were contraindicated w/ thrombocytopenia. ??He also developed atrial fibrillation on admit and was started on Diltiazem drip and then Amiodarone drip. ??When he was admitted he was neutropenic, so he was pan-cultured and empirically started on Merrem for possible bacterial pneumonia and Eraxis for possible fungal  pneumonia. ??His Fungitell and galactomannan (01/25/18)??were both positive, but blood cultures were negative. ????  ??  His chest pain and shortness of breath slowly improved. ??His atrial fibrillation was controlled on oral Diltiazem, Amiodarone and Metoprolol. ??He is now in NSR. ??His dyspnea and abnormal lung exam w/ rhonchi and wheezing persisted despite steroids and antibiotics, so he underwent bronchoscopy w/ BAL (02/03/18). ??This identified parainfluenza virus, Acinetobacter baumanni??&??Adenovirus. ??He completed 16 days of Merrem (02/08/18) for possible bacterial pneumonia from  [place]. ID was consulted for recommendations and they believe he has completed a full course of Merrem and does not need to continue on discharge. ??They do recommend continuing antifungal treatment for possible fungal pneumonia, so he was discharged on Cresemba after completing 15 days of Eraxis as inpatient. ??????  ??  He was then discharged and started treatment with Elo/Pom/Dex on 02/18/18. He was tolerating this well but continued with shortness of breath, which was followed closely by Dr. Dorann Lodge. He then presented to outpatient oncology for a blood transfusions on 626/19 and developed a fever >101. Given his immunocompromised state, the decision was made to admit him for IV antibiotics and further work up of his fever.    Hospital Course:   On admission blood cultures were obtained and he was started on empiric IV abx coverage with zosyn (6/26-7/1). He has remained afebrile through admit and has negative blood cultures. He is not requiring oxygen, though he does cont to c/o cough and SOB, both improved. He has now completed 6 days of empiric IV abx coverage and is feeling better. He will cont PO Cipro for 8 more days for a total of 14 days of empiric abx coverage. He will also cont cresemba for previously diagnosed aspergillosis.    He will return to [facility] clinic Wed, 03/02/18 for provider f/u and labs.      Physical Exam:     Vital Signs:  BP 103/65    Pulse 83    Temp 98.5 ??F (36.9 ??C) (Oral)    Resp 14    Ht 5' 8"  (1.727 m)    Wt 182 lb 6.4 oz (82.7 kg)    SpO2 93%    BMI 27.73 kg/m??     Weight:    Wt Readings from Last 3 Encounters:   02/28/18 182 lb 6.4 oz (82.7 kg)   02/21/18 187 lb (84.8 kg)   02/08/18 193 lb 12.6 oz (87.9 kg)       General: Awake, alert and oriented.  HEENT: normocephalic, PERRL, no scleral erythema or icterus, Oral mucosa moist and intact, throat clear  NECK: supple without palpable adenopathy  BACK: Straight negative CVAT  SKIN: warm dry and intact without lesions rashes or  masses  CHEST: CTA bilaterally without use of accessory muscles  CV: Normal S1 S2, RRR, no MRG  ABD: NT ND normoactive BS, no palpable masses or hepatosplenomegaly  EXTREMITIES: without edema, denies calf tenderness  NEURO: CN II - XII grossly intact  CATHETER: Port in R chest wall.        Discharge Laboratory Data:  CBC:   Recent Labs     02/26/18  0330 02/27/18  0330 02/28/18  0330   WBC 4.8 4.9 5.5   HGB 7.4* 7.1* 7.1*   HCT 21.7* 21.4* 21.1*   MCV 90.4 90.7 90.7   PLT 21* 21* 22*     BMP/Mag:  Recent Labs     02/26/18  0330 02/27/18  0330 02/28/18  0330  NA 135* 136 137   K 3.1* 3.6 3.8   CL 100 103 105   CO2 23 23 20*   PHOS  --   --  2.8   BUN 6* 6* 6*   CREATININE 0.7* 0.7* 0.6*   MG  --   --  1.80     LIVP:   Recent Labs     02/28/18  0330   AST 10*   ALT 15   BILIDIR <0.2   BILITOT <0.2   ALKPHOS 90     Coags:   Recent Labs     02/28/18  0330   PROTIME 14.2*   INR 1.25*   APTT 28.6     Uric Acid   Recent Labs     02/28/18  0330   LABURIC 1.2*     Tacro:  No results for input(s): TACROLEV in the last 72 hours.  CMV Quant DNA by PCR:   Lab Results   Component Value Date/Time    CMVDNAQNT <2.4 04/24/2017 04:19 AM    CMVDNAQNT Not Detected 04/24/2017 04:19 AM       Diagnostics:  1. ??PET scan (01/10/18):    ??  Myeloma Labs (01/03/18):  SPEP:????reveals that M2, previously characterized as monoclonal IgG kappa in  mid-to-slow gamma is 1.0 gm/dL, greater than the 0.2 gm/dL observed on 08 Nov 2017.??  SIFE:????Serum immunofixation electrophoresis reveals persistent M2 in mid-to-slow gamma, a monoclonal IgG kappa. ??A recent M-spike, M1, was minor monoclonal IgG lambda in mid-gamma; M1 continues to be absent.  SFLC:  Kappa:  30.80, previously (11/08/17) -??10.40  Lambda:  1.46, previously (11/08/17) -??5.97  Free Kappa Lambda Ratio:  21.10, previously (11/08/17) -??1.74  Quantitative Immunoglobulins:  IgG:??  1480, previously (11/08/17) - 552  IgA:??  <26, previously (11/08/17) -??30  IgM:??  <20  ??  PROBLEM LIST: ??????????   ????  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 ??COPD  9.????Influenza A??(10/12/17)  10. H/o Acute pericarditis  11. PAF w/RVR  ????  TREATMENT: ??????????   ??  1. Rad Tx to T5-7, T10-L3, Right Scapula - 3000 cGy - Dr. Jamas Lav 03/20/16-04/01/16  2. RVD x1 03/20/16 - discontinued d/t rash   3. Velcade/Pomalyst/Dex x3 cycles 04/23/16-06/17/16 - discontinued d/t reaction to pomalyst  4. Velcade/Dex x 1 cycle 06/26/16 (last dose of dexamethasone 07/22/16  5. High-dose melphalan followed by administration of PBSCs 2.36 x10^6 cd34cells/kg on 08/28/16  6. Maintenance Revlimid 77m daily (12/2016-04/23/17)  7. Dexamethasone 430mdaily (04/24/17)  8. DCEP   Cycle #1 - 04/26/17  Cycle #2 - 05/25/17 - excellent response  9.??Dara/Velcade/Dex (C1D1 - 06/22/17) - BMBx ??10/18/17 - CR  Cycles 7-8 (21 day cycle)  - Dex 20 mg po D4,5.8,9,11 and 12  - Dara 16 mg/Kg D1 only with Dex 20 mg IV  - Velcade 1.3 mg/M2 D1,4,8 and 11  Cycle 9 and beyond (maintenance, 11/08/17??- 01/03/18)  Dara 16 mg/Kg Q 28 days  Dex 12 mg IV D1  Velcade Q 2 weeks??  10. DCEP 01/13/18  11. Elo/Pom/Dex - C1D1 02/18/18  ??  ASSESSMENT AND PLAN:??????????   ??  1. IgG kappa multiple myeloma / Plasma Cell Leukemia:??Relapsed disease   - S/p treatment Dara/Velcade/Dex x 8 cycles (06/22/17 - 10/2017). Followed by maintenance Dara Q4 wks/Velcade Q2ks/Dex (started 11/08/17)  - Progressive disease based on myeloma  labs (01/03/18) &??PET scan (01/10/18), see above   - S/p 1 cycle DCEP     PLAN: Elo/Pom 2 mg/Dex (hold pred on dex days) (C1D1 02/18/18). Hope for allogeneic transplant in CR2, but needs to disease response and improvement in lung function   - M-spike (01/27/18) - 0.5 g/dL, previously 1.0 g/dL  - MMP 02/18/18: M-spike 0.5gm/dL (decreased from 1 12/2017). SFLC ratio 4.95  ??  Elo/Pom/Dex  Cycle 1 Day  +11    2. ID:??Admitted with fever up to 102.4 as outpt 2/2 RUL bacterial PNA. Afebrile since admission  - Fungitell and galactomannan (01/25/18) -??Positive; Bronch / Diatherix (02/03/18) - parainfluenza virus, Acinetobacter baumanni, Adenovirus -- S/p Meropenem Day x 19 days  - Consulted ID: No need to cont abx for Acinetobacter PNA, but cont antifungal for tree in bud on CT chest  - Continue Valtrex ppx  - Cont??Cresemba for fungal PNA. Cont to monitor fungitel weekly:   - 5/28 - + 403   - 6/28 -  201   - 7/5 - next due  - Blood Cxs 6/26 - NG  - CXR 02/23/18: RUL pneumonia  - Cont Cipro - total days of tx: Day +6/14 (6/26-7/9)    Abx Hx:  Zosyn 6/26-7/1    3. Heme:??Chemotherapy induced anemia and thrombocytopenia   - Transfuse for Hgb <??8 (severe COPD) and Platelets <??20K  - No transfusion today    4. Metabolic: HypoNa, otherwise electrolytes & renal fxn stable  - Cont KPhos 1 tablet BID (started 02/04/18)  - Keep Mg > 2 and K+ > 4.0??  - Encourage PO fluid intake  - Check CMP, Mg, Phos at next office visit    5. Pulmonary: H/o severe COPD, Continue treatment for possible fungal PNA.   Imaging:  - Pulm Nodule: Stable on CT chest since 04/23/17; cont to monitor.   - CTPA (01/22/18): No PE, patchy groundglass opacities and tree-in-bud are seen throughout the lungs which are new compared to prior examination.   - CT chest (01/31/18): Stable innumerable pulmonary nodules bilaterally favoring metastatic disease.  - PFT (12/27/17): compared to 07/2016: FVC 3.25 L, 66%, and FEV1  1.42 L, 38%. FEV1/FVC is 44%. This is unchanged from prior study. Air trapping seen on lung volumes. Diffusion capacity 3.91, 79% of predicted, down from 5.21     Tx:  - S/p Steroid taper:??Solumedrol??80 mg TID (started 01/22/18), 40 mg TID (01/25/18), 40 mg bid (01/26/18), decrease/change Pred 25 mg bid (02/03/18),??40 mg daily x 5 days, 20 mg daily x 5 days (begin 02/16/18), 10 mg daily x 5 days, 5 mg daily x 5 days, then stop (last dose 02/27/18)  -??Will be  followed off prednisone receiving only the Decadron pulses as part of his chemotherapy   - COPD: Cont??Spiriva and Breo  - Cont ID tx as above  - Consult Dr. Dorann Lodge - appreciate recs    6. GI / Nutrition: Appetite and oral intake is fair  - Continue PPI ppx w/ steroids   - Continue low microbial diet   Constipation: Improved  - Continue Dulcolax prn &??Senokot-s 2 tabs daily     7. Cardiac: H/o PAF w/RVR, Acute pericarditis, HLD, & evidence of septal wall defect on echocardiogram  - Echo (01/23/18): LVEF 50-55% w/ normal diastolic fxn &??no obvious regional wall motion abnormalities  - Continue Lipitor   - Continue Diltiazem CD 240 mg daily & Amiodarone 200 mg daily.??Lopressor 25 mg bid on hold 6/17 until BP increases    8.  Psych, Anxiety & Depression: Ongoing concerns about disease progression and ability to get transplant   - Continue Zoloft 50 mg daily   - Psych to follow as needed once outpt    9. Peripheral Neuropathy:??Ongoing  - Continue Gabapentin 300 mg BID, 636m qhs    10. Bone Health: No acute fx   - H/o spinal cord compression &??diffuse lytic lesions t/o skeleton  - CT Chest (04/23/17) - multiple lytic lesions throughout the skeleton   - Continue Ca/Vit D &??Zometa monthly (given 02/10/18)     11. 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  ??  Condition on discharge: Stable    Discharge Instructions:  [facility] provider assessment & labs (CBC w/diff, CMP, Mg, Phos).     The patient was advised on activity and dietary restrictions.    The patient was advised to follow up in the emergency department or contact the physician with any unresolved nausea/vomiting/diarrhea/pain or temperature greater than 100.5 F or any  other unusual symptoms.       This discharge summary and plan was discussed and agreed upon with Dr. BDerrill Kay      BLoma Newton APRN - CNP

## 2018-02-28 NOTE — Plan of Care (Signed)
Problem: Bleeding:  Goal: Will show no signs and symptoms of excessive bleeding  Description  Will show no signs and symptoms of excessive bleeding  Note:   Patient's hemoglobin this AM:   Recent Labs     02/28/18  0330   HGB 7.1*     Patient's platelet count this AM:   Recent Labs     02/28/18  0330   PLT 22*     Thrombocytopenia precautions in place. Patient showing no signs or symptoms of active bleeding. Transfusion is not indicated at this time. Patient verbalizes understanding of all instructions. Will continue to assess and implement POC. Call light within reach and hourly rounding in place.       Problem: Infection - Central Venous Catheter-Associated Bloodstream Infection:  Goal: Will show no infection signs and symptoms  Description  Will show no infection signs and symptoms  Note:   CVC site remains free of signs/symptoms of infection. No drainage, edema, erythema, pain, or warmth noted at site. Dressing changes continue per protocol and on an as needed basis - see flowsheet.      Problem: Falls - Risk of:  Goal: Will remain free from falls  Description  Will remain free from falls  Note:   High Fall Risk per MORSE/ABCDS: Explained fall risk precautions to pt and family and rationale behind their use to keep the patient safe. Pt bed is in low position, side rails up, call light and belongings are in reach. Fall wristband applied and present on pts wrist. Bed alarm on.  Pt encouraged to call for assistance. Will continue with hourly rounds for PO intake, pain needs, toileting and repositioning as needed.      Problem: Nutrition Deficit:  Goal: Ability to achieve adequate nutritional intake will improve  Description  Ability to achieve adequate nutritional intake will improve  Note:   Pt reports having an adequate appetite at this time. Will continue to encourage PO intake.     Problem: Pain:  Goal: Pain level will decrease  Description  Pain level will decrease  Note:   Pt continues with chronic back  pain. A new Fentanyl patch was applied to the pt's right upper back. Percocet also given x1 this shift per PRN order and per pt request. Pt resting quietly with RR >10 upon pain reassessment. Will continue to assess for pain and monitor.     Problem: PROTECTIVE PRECAUTIONS  Goal: Patient will remain free of nosocomial Infections  Note:   Protective precautions in place. Pt afebrile this shift, VSS. Will continue to monitor.    Compliant with BCC Bath Protocol:  Performed CHG bath on previous shift per [facility] protocol utilizing CHG solution in the shower. Continued to encourage daily CHG bathing per [facility] protocol.     Problem: Activity:  Goal: Ability to tolerate increased activity will improve  Description  Ability to tolerate increased activity will improve  Note:   Pt with activity orders for up ad lib.  Encouraged pt to be up OOB as much as possible throughout the day and for all meals.  Encouraged frequent short naps as necessary to preserve energy but instructed that while awake, pt should be OOB.  Encouraged pt to ambulate in halls.  Will continue to encourage frequent activity.     Problem: Venous Thromboembolism:  Goal: Will show no signs or symptoms of venous thromboembolism  Description  Will show no signs or symptoms of venous thromboembolism  Note:   Pt is at risk  for DVT d/t decreased mobility and cancer treatment.  Pt educated on importance of activity.  Pt has orders for SCD's while in bed.  Pt verbalizes understanding of need for prophylaxis while inpatient.

## 2018-02-28 NOTE — Progress Notes (Signed)
Pulmonary Followup Note    CC: pneumonia, multiple myeloma  Subjective:  Feeling better than when he came in. More cough for my exam, but it is productive.  Frustrated by frequent pulmonary infections, wants to go home    ROS:  Denies headache, nausea or chest pain.    24HR INTAKE/OUTPUT:      Intake/Output Summary (Last 24 hours) at 02/28/2018 0933  Last data filed at 02/28/2018 1610  Gross per 24 hour   Intake 1950 ml   Output 2800 ml   Net -850 ml       ??? dextromethorphan-guaiFENesin  2 tablet Oral BID   ??? fentaNYL  1 patch Transdermal Q72H   ??? amiodarone  200 mg Oral Daily   ??? atorvastatin  10 mg Oral Nightly   ??? calcium elemental  500 mg Oral BID   ??? diltiazem  240 mg Oral Daily   ??? gabapentin  300 mg Oral TID   ??? Isavuconazonium Sulfate  372 mg Oral Daily   ??? pantoprazole  40 mg Oral QAM AC   ??? phosphorus  1 tablet Oral BID   ??? pomalidomide  2 mg Oral Daily   ??? sennosides-docusate sodium  2 tablet Oral Daily   ??? sertraline  50 mg Oral Daily   ??? tiotropium  18 mcg Inhalation Daily   ??? valACYclovir  500 mg Oral BID   ??? piperacillin-tazobactam  4.5 g Intravenous Q6H   ??? sodium chloride flush  10 mL Intravenous 2 times per day   ??? Saline Mouthwash  15 mL Swish & Spit 4x Daily AC & HS   ??? budesonide  0.5 mg Nebulization BID    And   ??? formoterol  20 mcg Nebulization BID           PHYSICAL EXAMINATION:  BP 103/65    Pulse 83    Temp 98.5 ??F (36.9 ??C) (Oral)    Resp 14    Ht 5' 8" (1.727 m)    Wt 182 lb 6.4 oz (82.7 kg)    SpO2 93%    BMI 27.73 kg/m??   CURRENT PULSE OXIMETRY:  SpO2: 93 %  24HR PULSE OXIMETRY RANGE:  SpO2  Avg: 95.1 %  Min: 93 %  Max: 97 % on ra      Gen: no distress. Speaking in full sentences without accessory muscle use  HEENT: PERRL, EOMI, OP nl  Lung:No wheezes, rales or ronchi.  No egophony or fremitus.  No accessory respiratory muscle use.   CV: RRR without M/R/R  Abd: +BS, soft, NT/ND  Ext: No edema.    DATA  CBC:   Recent Labs     02/26/18  0330  02/27/18  0330 02/28/18  0330   WBC 4.8 4.9 5.5   HGB 7.4* 7.1* 7.1*   HCT 21.7* 21.4* 21.1*   MCV 90.4 90.7 90.7   PLT 21* 21* 22*     BMP:   Recent Labs     02/26/18  0330 02/27/18  0330 02/28/18  0330   NA 135* 136 137   K 3.1* 3.6 3.8   CL 100 103 105   CO2 23 23 20*   PHOS  --   --  2.8   BUN 6* 6* 6*   CREATININE 0.7* 0.7* 0.6*     No results for input(s): PHART, PCO2ART, PO2ART in the last 72 hours.  LIVER PROFILE:   Recent Labs     02/28/18  0330  AST 10*   ALT 15   BILIDIR <0.2   BILITOT <0.2   ALKPHOS 90       CXR REVIEWED BY ME AND SHOWED:  XR CHEST STANDARD (2 VW)   Final Result   Impression: New airspace disease of the right upper lobe, and to a lesser extent the right lower lobe.      XR CHEST PORTABLE    (Results Pending)        ASSESSMENT/PLAN:  This is a 62 y.o. male with RUL pneumonia and multiple myeloma    Chest xray showed stable RUL opacity but the rest of the xray looked improved.  Currently on zosyn empirically.  Transitioning to an oral anti-pseudomonal would make sense  Patient is discharge appropriate given that he has reliable follow up.      Neville Route, MD

## 2018-03-02 ENCOUNTER — Inpatient Hospital Stay: Admit: 2018-03-02 | Discharge: 2018-03-02 | Payer: BLUE CROSS/BLUE SHIELD | Primary: Hematology & Oncology

## 2018-03-02 DIAGNOSIS — C9000 Multiple myeloma not having achieved remission: Secondary | ICD-10-CM

## 2018-03-02 LAB — PREPARE PLATELETS: Dispense Status Blood Bank: TRANSFUSED

## 2018-03-02 MED ORDER — DIPHENHYDRAMINE HCL 25 MG PO TABS
25 MG | Freq: Once | ORAL | Status: AC | PRN
Start: 2018-03-02 — End: 2018-03-02
  Administered 2018-03-02: 15:00:00 25 mg via ORAL

## 2018-03-02 MED ORDER — ACETAMINOPHEN 325 MG PO TABS
325 MG | Freq: Once | ORAL | Status: AC | PRN
Start: 2018-03-02 — End: 2018-03-02
  Administered 2018-03-02: 15:00:00 650 mg via ORAL

## 2018-03-02 MED ORDER — HEPARIN SOD (PORK) LOCK FLUSH 100 UNIT/ML IV SOLN
100 UNIT/ML | INTRAVENOUS | Status: DC | PRN
Start: 2018-03-02 — End: 2018-03-03
  Administered 2018-03-02: 16:00:00 500 [IU]

## 2018-03-02 MED FILL — HEPARIN LOCK FLUSH 100 UNIT/ML IV SOLN: 100 [IU]/mL | INTRAVENOUS | Qty: 5

## 2018-03-02 MED FILL — BENADRYL ALLERGY 25 MG PO TABS: 25 mg | ORAL | Qty: 1

## 2018-03-02 MED FILL — ACETAMINOPHEN 325 MG PO TABS: 325 mg | ORAL | Qty: 2

## 2018-03-02 NOTE — Plan of Care (Signed)
Problem: Bleeding:  Goal: Will show no signs and symptoms of excessive bleeding  Description  Will show no signs and symptoms of excessive bleeding  Outcome: Ongoing  Note:   Patient's hemoglobin this AM: 7.5  Patient's platelet count this AM: 18  Pt seen and assessed at [facility].  Seen at [facility] today for 1 unit Platelet transfusion per standing orders for above lab values per [facility].  Blood products transfused per [facility] policy.  Pt tolerated transfusion well and without incident.  Pt verbalizes understanding of discharge instructions.  Discharged via wheelchair to home with pt's wife.

## 2018-03-02 NOTE — Discharge Instructions (Signed)
Thank you for visiting the [facility]patient [facility]cology and [place] at [facility] on July 3rd, 2019. Your nurse today was Tanna Furry.      At the [facility] we are dedicated to making sure you have a positive experience. If there's any way we can be of further assistance or service to you, please don't hesitate to ask.    Please review attached discharge instructions prior to leaving the infusion area and let your nurse know if you have any questions.    If you have questions about these instructions after leaving the infusion area, please don't hesitate to call us with any questions or concerns.    Thank you,    Outpatient Oncology and [place] Staff    Treatment Suite:     [phone]  Manager: Erick Alley    [phone]  Clinical Coordinator: Deatra James  [phone]     Preventing Falls: Care Instructions  Your Care Instructions    Getting around your home safely can be a challenge if you have injuries or health problems that make it easy for you to fall. Loose rugs and furniture in walkways are among the dangers for many older people who have problems walking or who have poor eyesight. People who have conditions such as arthritis, osteoporosis, or dementia also have to be careful not to fall.  You can make your home safer with a few simple measures.  Follow-up care is a key part of your treatment and safety. Be sure to make and go to all appointments, and call your doctor if you are having problems. It's also a good idea to know your test results and keep a list of the medicines you take.  How can you care for yourself at home?  Taking care of yourself   You may get dizzy if you do not drink enough water. To prevent dehydration, drink plenty of fluids, enough so that your urine is light yellow or clear like water. Choose water and other caffeine-free clear liquids. If you have kidney, heart, or liver disease and have to  limit fluids, talk with your doctor before you increase the amount of fluids you drink.   Exercise regularly to improve your strength, muscle tone, and balance. Walk if you can. Swimming may be a good choice if you cannot walk easily.   Have your vision and hearing checked each year or any time you notice a change. If you have trouble seeing and hearing, you might not be able to avoid objects and could lose your balance.   Know the side effects of the medicines you take. Ask your doctor or pharmacist whether the medicines you take can affect your balance. Sleeping pills or sedatives can affect your balance.   Limit the amount of alcohol you drink. Alcohol can impair your balance and other senses.   Ask your doctor whether calluses or corns on your feet need to be removed. If you wear loose-fitting shoes because of calluses or corns, you can lose your balance and fall.   Talk to your doctor if you have numbness in your feet.  Preventing falls at home   Remove raised doorway thresholds, throw rugs, and clutter. Repair loose carpet or raised areas in the floor.   Move furniture and electrical cords to keep them out of walking paths.   Use nonskid floor wax, and wipe up spills right away, especially on ceramic tile floors.   If  you use a walker or cane, put rubber tips on it. If you use crutches, clean the bottoms of them regularly with an abrasive pad, such as steel wool.   Keep your house well lit, especially stairways, porches, and outside walkways. Use night-lights in areas such as hallways and bathrooms. Add extra light switches or use remote switches (such as switches that go on or off when you clap your hands) to make it easier to turn lights on if you have to get up during the night.   Install sturdy handrails on stairways.   Move items in your cabinets so that the things you use a lot are on the lower shelves (about waist level).   Keep a cordless phone and a flashlight with new batteries by your  bed. If possible, put a phone in each of the main rooms of your house, or carry a cell phone in case you fall and cannot reach a phone. Or, you can wear a device around your neck or wrist. You push a button that sends a signal for help.   Wear low-heeled shoes that fit well and give your feet good support. Use footwear with nonskid soles. Check the heels and soles of your shoes for wear. Repair or replace worn heels or soles.   Do not wear socks without shoes on wood floors.   Walk on the grass when the sidewalks are slippery. If you live in an area that gets snow and ice in the winter, sprinkle salt on slippery steps and sidewalks.  Preventing falls in the bath   Install grab bars and nonskid mats inside and outside your shower or tub and near the toilet and sinks.   Use shower chairs and bath benches.   Use a hand-held shower head that will allow you to sit while showering.   Get into a tub or shower by putting the weaker leg in first. Get out of a tub or shower with your strong side first.   Repair loose toilet seats and consider installing a raised toilet seat to make getting on and off the toilet easier.   Keep your bathroom door unlocked while you are in the shower.  Where can you learn more?  Go to [email or web address] and sign in to your MyChart account. Enter G117 in the [place] box to learn more about "Preventing Falls: Care Instructions."     If you do not have an account, please click on the "Sign Up Now" link.  Current as of: July 07, 2017  Content Version: 12.0   2006-2019 Healthwise, Incorporated. Care instructions adapted under license by [facility]. If you have questions about a medical condition or this instruction, always ask your healthcare professional. [place] any warranty or liability for your use of this information.         Learning About a Central Venous Catheter  What is a central venous catheter?    A central  venous catheter (CVC), also called a central line, is a long, thin, flexible tube used to give medicines, fluids, nutrients, or blood products over a long period of time, usually several weeks or more. It can also be used to do blood tests. The catheter makes doing these things more comfortable for you because they are put directly into the catheter. You are not stuck with a needle every time.  The CVC is inserted in your arm, chest, or neck. It is put through the skin and into a large vein. The  catheter is threaded through this vein until it reaches a large vein near the heart. In most cases, the other end of the catheter--the end used to give medicines--sticks out of the skin.  Some of the common CVCs that are used outside the hospital or for longer periods of time include:   A peripherally inserted central catheter, or PICC line (say "pick"), which usually goes into a vein in your arm.   A tunneled catheter, which is surgically inserted into a vein in the neck or chest.   An implanted port, which is similar to a tunneled catheter but is left entirely under the skin. Medicines are injected through a "port" placed under the skin.  Once your CVC is in place, you may stay in the hospital to receive your medicines, or you may get your medicines at home.  What problems can occur?  Possible problems with a central venous catheter include:   Bleeding. This may happen when the doctor inserts the catheter. It is usually mild and will stop by itself.   Infection. If infection occurs, you will need antibiotics or the catheter will be removed.   Blockage or kinking of the catheter. Regular flushing of the catheter helps reduce blockage. A kinked catheter must be repositioned or replaced.   Pain. You may experience pain at the place where the catheter is inserted or where it lies under your skin.   Collapsed lung (pneumothorax). This is rare. It is most likely to happen during placement of a catheter in the  chest.   Shifting of the catheter. A catheter that has moved out of place can sometimes be repositioned. If this does not work, it must be replaced.  What happens when you get a central venous catheter?  Your catheter will be inserted by your doctor. Your doctor may use ultrasound to locate the blood vessel and help with the placement of the catheter.  When you are in the hospital, a nursing team will take care of you and your CVC.  Insertion and care of the CVC  Your nursing team will:   Check the catheter site and dressing regularly. How often this is done depends on the situation.   Wash their hands before and after handling the catheter.   Replace the catheter when needed.   Clean or replace the catheter components when needed.  Changing the dressing  Your team will:   Use clean and proper materials for the dressing, which covers the catheter site.   Clean the insertion site and area whenever they change the dressing.   Replace the dressing when it is damp, loose, or dirty. It will be changed at least once a week.  At home  If you go home with a CVC, your team will give you detailed instructions on how to care for it. In general:   Always wash your hands before you touch your CVC. Make sure anyone who touches the catheter also washes his or her hands.   Try to keep the exit site dry. This can help prevent infection. When you shower, cover the site with waterproof material, such as plastic wrap. Be sure you cover both the exit site and the central line cap(s).   Fasten or tape the central line to your body to prevent it from pulling or dangling. Avoid bending or crimping your central line. And wear clothing that doesn't rub or pull on your central line.  Follow-up care is a key part of your treatment and safety.  Be sure to make and go to all appointments, and call your doctor if you are having problems. It's also a good idea to know your test results and keep a list of the medicines you take.  Where  can you learn more?  Go to [email or web address] and sign in to your MyChart account. Enter [phone] in the [place] box to learn more about "Learning About a Central Venous Catheter."     If you do not have an account, please click on the "Sign Up Now" link.  Current as of: May 23, 2017  Content Version: 12.0   2006-2019 Healthwise, Incorporated. Care instructions adapted under license by [facility]. If you have questions about a medical condition or this instruction, always ask your healthcare professional. [place] any warranty or liability for your use of this information.         Learning About Blood Transfusions  What is a blood transfusion?    Blood transfusion is a medical treatment to replace the blood or parts of blood that your body has lost. The blood goes through a tube from a bag to an intravenous (IV) catheter and into your vein.  You may need a blood transfusion after losing blood from an injury, a major surgery, an illness that causes bleeding, or an illness that destroys blood cells.  Transfusions are also used to give you the parts of blood--such as platelets, plasma, or substances that cause clotting--that your body needs to fight an illness or stop bleeding.  How is a blood transfusion done?  Before you receive a blood transfusion, your blood is tested to find out what your blood type is. Blood or blood parts that are a match with your blood type are ordered by your doctor. Blood is typed as A, B, AB, or O. It is also typed as Rh-positive or Rh-negative.  Your blood is also screened to look for antibodies that might react with the blood that is given to you. The blood you are getting is checked and rechecked to make sure that it's the right type for you.  A sample of your blood is mixed with a sample of the blood you will receive to check for problems. Before actually giving you the transfusion, a doctor and nurses will look at  the label on the package of blood and compare it to your hospital ID bracelet and medical records. The transfusion begins only when all agree that this is the correct blood and that you are the correct person to receive it.  To receive the transfusion, you will have an intravenous (IV) catheter inserted into a vein. A tube connects the catheter to the bag containing the blood, which is placed higher than your body. The blood then flows slowly into your vein. A doctor or nurse will check you several times during the transfusion to watch for a reaction or other problems.  What are the possible risks?  Blood transfusions have many benefits and are often life-saving. But they also have a few risks. Possible risks include:   Your body's reaction to receiving new blood. This may include:  ? Fever.  ? Allergic reactions.  ? Breathing problems.   An infection from the blood. This risk is small because of the strict rules placed on handling and storing blood. Getting a viral infection, such as HIV or hepatitis B or C, through blood transfusions has become very rare. The U.S. Food and Drug Administration (FDA) enforces strict guidelines on  the collection, testing, storage, and use of blood.   Getting the wrong blood type by accident. Severe reactions, which can be life-threatening, are very rare.  What can you expect after a blood transfusion?  Here are some things you can do at home to help prevent infection at the transfusion site:   Wash the area daily with warm, soapy water, and pat it dry. Don't use hydrogen peroxide or alcohol, which can slow healing. You may cover the area with a gauze bandage if it weeps or rubs against clothing. Change the bandage every day.   Keep the area clean and dry.  When should you call for help?  Call 911 anytime you think you may need emergency care. For example, call if:   You have severe trouble breathing.  Call your doctor now or seek immediate medical care if:   You have a  fever.   You feel weaker or more tired than usual.   You have a yellow tint to your skin or the whites of your eyes.  Watch closely for changes in your health, and be sure to contact your doctor if you have any problems.  Follow-up care is a key part of your treatment and safety. Be sure to make and go to all appointments, and call your doctor if you are having problems. It's also a good idea to know your test results and keep a list of the medicines you take.  Where can you learn more?  Go to [email or web address] and sign in to your MyChart account. Enter [phone] in the [place] box to learn more about "Learning About Blood Transfusions."     If you do not have an account, please click on the "Sign Up Now" link.  Current as of: Jan 03, 2017  Content Version: 12.0   2006-2019 Healthwise, Incorporated. Care instructions adapted under license by [facility]. If you have questions about a medical condition or this instruction, always ask your healthcare professional. [place] any warranty or liability for your use of this information.         Thrombocytopenia: Care Instructions  Your Care Instructions    Thrombocytopenia is a low number of platelets in the blood. Platelets are the cells that help blood clot. If you don't have enough of them, your blood cannot clot well. So it is harder to stop bleeding.  You may have low platelets because your bone marrow does not make them. Or your body's defenses (immune system) may destroy them.  Having an enlarged spleen can also reduce the number of platelets in your blood. This is because they can get trapped in the enlarged spleen.  Some diseases or medicines may also cause low platelets. But platelets may go back to normal levels if the disease is treated or the medicine is stopped.  You may not need treatment if your problem is mild. If you do need treatment, you may have platelets added to your blood. Or you may  get medicine to stop the loss of platelets or help your body make them.  Follow-up care is a key part of your treatment and safety. Be sure to make and go to all appointments, and call your doctor if you are having problems. It's also a good idea to know your test results and keep a list of the medicines you take.  How can you care for yourself at home?   Be safe with medicines. Take your medicines exactly as prescribed. Call your doctor  if you think you are having a problem with your medicine.   Do not take aspirin or anti-inflammatory medicines unless your doctor says it is okay. Examples are ibuprofen (Advil, Motrin) and naproxen (Aleve). They may increase the risk of bleeding.   Avoid contact sports or activities that could cause you to fall.  When should you call for help?  Call 911 anytime you think you may need emergency care. For example, call if:    You passed out (lost consciousness).     You have signs of severe bleeding, which includes:  ? You have a severe headache that is different from past headaches.  ? You vomit blood or what looks like coffee grounds.  ? Your stools are maroon or very bloody.   Call your doctor now or seek immediate medical care if:    You are dizzy or lightheaded, or you feel like you may faint.     You have abnormal bleeding, such as:  ? A nosebleed that you can't easily stop.  ? Your stools are black and look like tar, or they have streaks of blood.  ? You have blood in your urine.  ? You have joint pain.  ? You have bruises or blood spots under your skin.   Watch closely for changes in your health, and be sure to contact your doctor if:    You do not get better as expected.   Where can you learn more?  Go to [email or web address] and sign in to your MyChart account. Enter [phone] in the [place] box to learn more about "Thrombocytopenia: Care Instructions."     If you do not have an account, please click on the "Sign Up Now"  link.  Current as of: Jan 03, 2017  Content Version: 12.0   2006-2019 Healthwise, Incorporated. Care instructions adapted under license by [name]. If you have questions about a medical condition or this instruction, always ask your healthcare professional. [place] any warranty or liability for your use of this information.

## 2018-03-04 LAB — PREPARE RBC (CROSSMATCH)

## 2018-03-07 ENCOUNTER — Ambulatory Visit
Admit: 2018-03-07 | Discharge: 2018-03-07 | Payer: BLUE CROSS/BLUE SHIELD | Attending: Pulmonary Disease | Primary: Hematology & Oncology

## 2018-03-07 DIAGNOSIS — J449 Chronic obstructive pulmonary disease, unspecified: Secondary | ICD-10-CM

## 2018-03-07 LAB — CULTURE, FUNGUS
Fungus (Mycology) Culture: NO GROWTH
Fungus Stain: NONE SEEN

## 2018-03-07 NOTE — Progress Notes (Signed)
Subjective:      Patient ID: David Watkins is a 62 y.o. male.    HPI David Watkins returns for follow-up after admission for right upper lobe pneumonia.  He is improving, not at his baseline from several months ago.  He has good days and bad days, clearly affected by outdoor temperatures humidity and barometer.  Cough is nonproductive, episodic.  Cough does not awaken him from sleep.  No significant chest pain.  No febrile illness.  He complains of feeling tired and weak.  Muscle strength is improving with continued exercise.  He continues to use the railing to help him walk up the stairs and he pushes down on the arms of the chair as he stands up.  He feels lightheaded frequently when standing up.      Review of Systems    Objective:   Physical Exam   Constitutional: He is oriented to person, place, and time. He appears well-developed and well-nourished.   HENT:   Head: Normocephalic and atraumatic.   Mouth/Throat: Oropharynx is clear and moist. No oropharyngeal exudate.   Eyes: Conjunctivae are normal. Right eye exhibits no discharge. No scleral icterus.   Neck: No tracheal deviation present. No thyromegaly present.   Cardiovascular: Normal rate, regular rhythm and intact distal pulses. Exam reveals distant heart sounds.   No murmur heard.  Pulmonary/Chest: Effort normal. No respiratory distress.   Breath sounds mildly reduced throughout all lung fields, without adventitious breath sounds   Musculoskeletal: He exhibits no edema.   Lymphadenopathy:     He has no cervical adenopathy.   Neurological: He is alert and oriented to person, place, and time.   Skin: Skin is warm and dry.   Psychiatric: He has a normal mood and affect. His behavior is normal. Judgment and thought content normal.       Assessment:      Severe COPD is clinically stable.  He recently had right upper lobe pneumonia, adequately treated with antibiotic therapy.  He has some residual tiredness that may be related to this.  The endpoint of recovery  after pneumonia and particularly after the prolonged exacerbation after viral bronchitis this past winter and spring, remains to be seen.  He clearly does not tolerate high heat and humidity environment.  His exercise tolerance is also limited by other factors.  Muscle strength is improving with exercise and continued time off systemic steroids.  Cardizem prescribed for atrial fibrillation is probably keeping his blood pressure low, and this further adds to his exercise limitation.      Plan:      Continue treatment of COPD with Breo and Spiriva inhalers.  He should stop Mucinex, of no value at this point.  He is to see Dr. Bunnie Philips regarding atrial fibrillation.  If this does not happen within the next few days, I will discontinue Cardizem, continue amiodarone  He is on active treatment for multiple myeloma    During this face-to-face encounter of 25 minutes, spent greater than 50% of the time counseling the patient on issues regarding shortness of breath, recovery and prognosis from COPD exacerbation, dealing with multiple myeloma, and dealing with multiple other medical issues affecting his exercise tolerance        Charleen Kirks, MD

## 2018-03-08 NOTE — Telephone Encounter (Signed)
Called pt needs to schedule hosp f/up with [facility]. Pt is going to check his calendar and call us back.

## 2018-03-22 LAB — CULTURE WITH SMEAR, ACID FAST BACILLIUS: AFB Culture (Mycobacteria): NO GROWTH

## 2018-03-25 ENCOUNTER — Inpatient Hospital Stay: Admit: 2018-03-25 | Discharge: 2018-03-25 | Payer: BLUE CROSS/BLUE SHIELD | Primary: Hematology & Oncology

## 2018-03-25 DIAGNOSIS — C9000 Multiple myeloma not having achieved remission: Secondary | ICD-10-CM

## 2018-03-25 LAB — PREPARE RBC (CROSSMATCH): Dispense Status Blood Bank: TRANSFUSED

## 2018-03-25 LAB — TYPE AND SCREEN
ABO/Rh: O POS
Antibody Screen: NEGATIVE

## 2018-03-25 MED ORDER — DIPHENHYDRAMINE HCL 25 MG PO TABS
25 MG | Freq: Once | ORAL | Status: AC | PRN
Start: 2018-03-25 — End: 2018-03-25

## 2018-03-25 MED ORDER — ACETAMINOPHEN 325 MG PO TABS
325 MG | Freq: Once | ORAL | Status: AC | PRN
Start: 2018-03-25 — End: 2018-03-25

## 2018-03-25 MED ORDER — HEPARIN SOD (PORK) LOCK FLUSH 100 UNIT/ML IV SOLN
100 UNIT/ML | INTRAVENOUS | Status: DC | PRN
Start: 2018-03-25 — End: 2018-03-26
  Administered 2018-03-25: 20:00:00 500 [IU]

## 2018-03-25 MED FILL — HEPARIN LOCK FLUSH 100 UNIT/ML IV SOLN: 100 [IU]/mL | INTRAVENOUS | Qty: 5

## 2018-03-25 NOTE — Plan of Care (Signed)
Problem: Falls - Risk of:  Goal: Will remain free from falls  Description  Will remain free from falls  Note:     Explained fall risk precautions to pt & wife and rationale behind their use to keep the patient safe. Belongings are in reach. Pt encouraged to notify staff for any and all assistance. Staff present in tx suite throughout entirety of pts treatment to monitor and protect from falls.  Assistance provided when ambulating to restroom utilizing Stay With Me.        Problem: KNOWLEDGE DEFICIT  Goal: Patient/S.O. demonstrates understanding of disease process, treatment plan, medications, and discharge instructions.  Note:   Patient's hemoglobin this AM: 7.1  Patient's platelet count this AM: No results for input(s): PLT in the last 72 hours.  Pt seen and assessed at [facility].  Seen at [facility] today for one unit of blood  per standing orders for above lab values.  Blood products transfused per [facility] policy.  Pt tolerated transfusion well and without incident.  Pt verbalizes understanding of discharge instructions.  Discharged via wheelchair to home with wife.

## 2018-03-28 ENCOUNTER — Ambulatory Visit
Admit: 2018-03-28 | Discharge: 2018-03-28 | Payer: BLUE CROSS/BLUE SHIELD | Attending: Pulmonary Disease | Primary: Hematology & Oncology

## 2018-03-28 DIAGNOSIS — J449 Chronic obstructive pulmonary disease, unspecified: Secondary | ICD-10-CM

## 2018-03-28 LAB — CULTURE, FUNGUS, BLOOD
Culture, Fungus Blood: NO GROWTH
Culture, Fungus Blood: NO GROWTH

## 2018-03-28 LAB — CULTURE, FUNGUS
Fungus (Mycology) Culture: NO GROWTH
Fungus Stain: NONE SEEN

## 2018-03-28 NOTE — Progress Notes (Signed)
Subjective:      Patient ID: David Watkins is a 62 y.o. male.    HPI David Watkins returns for one-month follow-up for COPD, as he recovers from a recent exacerbation with pneumonia.  He continues to improve, feels that he is gaining strength, able to walk further.  He is using a walker now and is not using a wheelchair at all.  He has good days and bad days, which she can feel in the morning upon arising.  Some of this may be weather related.  He has minimal cough, no sputum.  No chest pain or tightness.  No nocturnal chest symptoms.  He continues on Brio Ellipta inhaler and Spiriva inhaler, does not require albuterol.  Today he was able to walk 100 feet with a walker.  He is not climbing steps.  He can lift a 5 pound object but cannot carry anything because he requires use of the walker.    Review of Systems    Objective:   Physical Exam   Constitutional: He is oriented to person, place, and time. He appears well-developed and well-nourished.   HENT:   Head: Normocephalic and atraumatic.   Mouth/Throat: Oropharynx is clear and moist. No oropharyngeal exudate.   Eyes: Conjunctivae are normal. Right eye exhibits no discharge. No scleral icterus.   Neck: No tracheal deviation present. No thyromegaly present.   Cardiovascular: Normal rate, regular rhythm, normal heart sounds and intact distal pulses.   No murmur heard.  Pulmonary/Chest: Effort normal and breath sounds normal. No respiratory distress. He has no wheezes. He has no rhonchi. He has no rales.   Breath sounds have a slightly harsh quality.   Musculoskeletal: He exhibits no edema.   Lymphadenopathy:     He has no cervical adenopathy.   Neurological: He is alert and oriented to person, place, and time.   Skin: Skin is warm and dry.   Psychiatric: He has a normal mood and affect. His behavior is normal. Judgment and thought content normal.       Assessment:      Severe COPD, stabilized after recent respiratory infections and exacerbation.  His functional status  depends substantially on having profound muscle weakness that came with his period of illness.  This is slowly improving as he is exercising, receiving OT/PT  Multiple myeloma, on weekly therapy to keep this under control.  He continues to look forward to being able to get a stem cell transplant.      Plan:      Continue Spiriva and Brio Ellipta inhalers.  Continue daily exercise as tolerated for endurance and muscle strengthening  Continue treatment for multiple myeloma.  He is not yet in adequate physical conditioning to consider stem cell transplant.    During this face-to-face encounter of 25 minutes, I spent greater than 50% of the time counseling the patient regarding issues of COPD, increasing muscle strength and endurance, readiness for stem cell transplant, risks for respiratory complications with SCT        Charleen Kirks, MD

## 2018-03-28 NOTE — Telephone Encounter (Signed)
Received fax from [facility] for restrictions and office visit notes.  Forms on  Dr [employer] desk to fill out.

## 2018-04-01 ENCOUNTER — Inpatient Hospital Stay: Payer: BLUE CROSS/BLUE SHIELD | Primary: Hematology & Oncology

## 2018-04-01 ENCOUNTER — Inpatient Hospital Stay: Admit: 2018-04-01 | Payer: BLUE CROSS/BLUE SHIELD | Primary: Hematology & Oncology

## 2018-04-01 ENCOUNTER — Encounter

## 2018-04-01 DIAGNOSIS — C9000 Multiple myeloma not having achieved remission: Secondary | ICD-10-CM

## 2018-04-01 DIAGNOSIS — E859 Amyloidosis, unspecified: Secondary | ICD-10-CM

## 2018-04-08 ENCOUNTER — Inpatient Hospital Stay: Admit: 2018-04-08 | Discharge: 2018-04-08 | Payer: BLUE CROSS/BLUE SHIELD | Primary: Hematology & Oncology

## 2018-04-08 DIAGNOSIS — C9000 Multiple myeloma not having achieved remission: Secondary | ICD-10-CM

## 2018-04-08 LAB — PREPARE RBC (CROSSMATCH): Dispense Status Blood Bank: TRANSFUSED

## 2018-04-08 LAB — TYPE AND SCREEN
ABO/Rh: O POS
Antibody Screen: NEGATIVE

## 2018-04-08 MED ORDER — HEPARIN SOD (PORK) LOCK FLUSH 100 UNIT/ML IV SOLN
100 UNIT/ML | INTRAVENOUS | Status: DC | PRN
Start: 2018-04-08 — End: 2018-04-09
  Administered 2018-04-08: 21:00:00 500 [IU]

## 2018-04-08 MED FILL — HEPARIN LOCK FLUSH 100 UNIT/ML IV SOLN: 100 [IU]/mL | INTRAVENOUS | Qty: 5

## 2018-04-08 NOTE — Plan of Care (Signed)
Problem: Bleeding:  Goal: Will show no signs and symptoms of excessive bleeding  Description  Will show no signs and symptoms of excessive bleeding  Outcome: Met This Shift  Note:   Patient's hemoglobin this AM: 6.2  Patient's platelet count this AM: 62.0  Pt seen and assessed at [facility].  Seen at [facility] today for PRBC per standing orders for above lab values.  Blood products transfused per [facility] policy.  Pt tolerated transfusion well and without incident.  Pt verbalizes understanding of discharge instructions.  Discharged via wheelchair to home with girlfriend.      Problem: Falls - Risk of:  Goal: Will remain free from falls  Description  Will remain free from falls  Outcome: Met This Shift  Note:   Pt is a medium fall risk.   Explained fall risk precautions to pt and rationale behind their use to keep the patient safe. Belongings are in reach. Pt encouraged to notify staff for any and all assistance. Staff present in tx suite throughout entirety of pts treatment to monitor and protect from falls.  Assistance provided when ambulating to restroom utilizing Stay With Me.

## 2018-04-08 NOTE — Discharge Instructions (Signed)
Preventing Falls: Care Instructions  Your Care Instructions    Getting around your home safely can be a challenge if you have injuries or health problems that make it easy for you to fall. Loose rugs and furniture in walkways are among the dangers for many older people who have problems walking or who have poor eyesight. People who have conditions such as arthritis, osteoporosis, or dementia also have to be careful not to fall.  You can make your home safer with a few simple measures.  Follow-up care is a key part of your treatment and safety. Be sure to make and go to all appointments, and call your doctor if you are having problems. It's also a good idea to know your test results and keep a list of the medicines you take.  How can you care for yourself at home?  Taking care of yourself   You may get dizzy if you do not drink enough water. To prevent dehydration, drink plenty of fluids, enough so that your urine is light yellow or clear like water. Choose water and other caffeine-free clear liquids. If you have kidney, heart, or liver disease and have to limit fluids, talk with your doctor before you increase the amount of fluids you drink.   Exercise regularly to improve your strength, muscle tone, and balance. Walk if you can. Swimming may be a good choice if you cannot walk easily.   Have your vision and hearing checked each year or any time you notice a change. If you have trouble seeing and hearing, you might not be able to avoid objects and could lose your balance.   Know the side effects of the medicines you take. Ask your doctor or pharmacist whether the medicines you take can affect your balance. Sleeping pills or sedatives can affect your balance.   Limit the amount of alcohol you drink. Alcohol can impair your balance and other senses.   Ask your doctor whether calluses or corns on your feet need to be removed. If you wear loose-fitting shoes because of calluses or corns, you can lose your  balance and fall.   Talk to your doctor if you have numbness in your feet.  Preventing falls at home   Remove raised doorway thresholds, throw rugs, and clutter. Repair loose carpet or raised areas in the floor.   Move furniture and electrical cords to keep them out of walking paths.   Use nonskid floor wax, and wipe up spills right away, especially on ceramic tile floors.   If you use a walker or cane, put rubber tips on it. If you use crutches, clean the bottoms of them regularly with an abrasive pad, such as steel wool.   Keep your house well lit, especially stairways, porches, and outside walkways. Use night-lights in areas such as hallways and bathrooms. Add extra light switches or use remote switches (such as switches that go on or off when you clap your hands) to make it easier to turn lights on if you have to get up during the night.   Install sturdy handrails on stairways.   Move items in your cabinets so that the things you use a lot are on the lower shelves (about waist level).   Keep a cordless phone and a flashlight with new batteries by your bed. If possible, put a phone in each of the main rooms of your house, or carry a cell phone in case you fall and cannot reach a phone. Or, you can   wear a device around your neck or wrist. You push a button that sends a signal for help.   Wear low-heeled shoes that fit well and give your feet good support. Use footwear with nonskid soles. Check the heels and soles of your shoes for wear. Repair or replace worn heels or soles.   Do not wear socks without shoes on wood floors.   Walk on the grass when the sidewalks are slippery. If you live in an area that gets snow and ice in the winter, sprinkle salt on slippery steps and sidewalks.  Preventing falls in the bath   Install grab bars and nonskid mats inside and outside your shower or tub and near the toilet and sinks.   Use shower chairs and bath benches.   Use a hand-held shower head that will allow  you to sit while showering.   Get into a tub or shower by putting the weaker leg in first. Get out of a tub or shower with your strong side first.   Repair loose toilet seats and consider installing a raised toilet seat to make getting on and off the toilet easier.   Keep your bathroom door unlocked while you are in the shower.  Where can you learn more?  Go to [email or web address] and sign in to your MyChart account. Enter G117 in the Search Health Information box to learn more about "Preventing Falls: Care Instructions."     If you do not have an account, please click on the "Sign Up Now" link.  Current as of: July 07, 2017  Content Version: 12.1   2006-2019 [employer]. Care instructions adapted under license by [facility]. If you have questions about a medical condition or this instruction, always ask your healthcare professional. Healthwise, Incorporated disclaims any warranty or liability for your use of this information.         Learning About Blood Transfusions  What is a blood transfusion?    Blood transfusion is a medical treatment to replace the blood or parts of blood that your body has lost. The blood goes through a tube from a bag to an intravenous (IV) catheter and into your vein.  You may need a blood transfusion after losing blood from an injury, a major surgery, an illness that causes bleeding, or an illness that destroys blood cells.  Transfusions are also used to give you the parts of blood--such as platelets, plasma, or substances that cause clotting--that your body needs to fight an illness or stop bleeding.  How is a blood transfusion done?  Before you receive a blood transfusion, your blood is tested to find out what your blood type is. Blood or blood parts that are a match with your blood type are ordered by your doctor. Blood is typed as A, B, AB, or O. It is also typed as Rh-positive or Rh-negative.  Your blood is also screened to look for  antibodies that might react with the blood that is given to you. The blood you are getting is checked and rechecked to make sure that it's the right type for you.  A sample of your blood is mixed with a sample of the blood you will receive to check for problems. Before actually giving you the transfusion, a doctor and nurses will look at the label on the package of blood and compare it to your hospital ID bracelet and medical records. The transfusion begins only when all agree that this is the correct blood and that you   are the correct person to receive it.  To receive the transfusion, you will have an intravenous (IV) catheter inserted into a vein. A tube connects the catheter to the bag containing the blood, which is placed higher than your body. The blood then flows slowly into your vein. A doctor or nurse will check you several times during the transfusion to watch for a reaction or other problems.  What are the possible risks?  Blood transfusions have many benefits and are often life-saving. But they also have a few risks. Possible risks include:   Your body's reaction to receiving new blood. This may include:  ? Fever.  ? Allergic reactions.  ? Breathing problems.   An infection from the blood. This risk is small because of the strict rules placed on handling and storing blood. Getting a viral infection, such as HIV or hepatitis B or C, through blood transfusions has become very rare. The U.S. Food and Drug Administration (FDA) enforces strict guidelines on the collection, testing, storage, and use of blood.   Getting the wrong blood type by accident. Severe reactions, which can be life-threatening, are very rare.  What can you expect after a blood transfusion?  Here are some things you can do at home to help prevent infection at the transfusion site:   Wash the area daily with warm, soapy water, and pat it dry. Don't use hydrogen peroxide or alcohol, which can slow healing. You may cover the area with a  gauze bandage if it weeps or rubs against clothing. Change the bandage every day.   Keep the area clean and dry.  When should you call for help?  Call 911 anytime you think you may need emergency care. For example, call if:   You have severe trouble breathing.  Call your doctor now or seek immediate medical care if:   You have a fever.   You feel weaker or more tired than usual.   You have a yellow tint to your skin or the whites of your eyes.  Watch closely for changes in your health, and be sure to contact your doctor if you have any problems.  Follow-up care is a key part of your treatment and safety. Be sure to make and go to all appointments, and call your doctor if you are having problems. It's also a good idea to know your test results and keep a list of the medicines you take.  Where can you learn more?  Go to [email or web address] and sign in to your MyChart account. Enter V588 in the Search Health Information box to learn more about "Learning About Blood Transfusions."     If you do not have an account, please click on the "Sign Up Now" link.  Current as of: November 25, 2017  Content Version: 12.1   2006-2019 [employer], [employer]. Care instructions adapted under license by [facility]. If you have questions about a medical condition or this instruction, always ask your healthcare professional. Health[employer], Incorporated disclaims any warranty or liability for your use of this information.

## 2018-04-17 DIAGNOSIS — A419 Sepsis, unspecified organism: Principal | ICD-10-CM

## 2018-04-17 NOTE — ED Provider Notes (Signed)
[place]          EM RESIDENT NOTE       Date of evaluation: 04/17/2018    Chief Complaint     Shortness of Breath      History of Present Illness     David Watkins is a 62 y.o. male with a past medical history of multiple myeloma currently receiving treatment (last treatment 2 days ago), COPD who presents with dyspnea that started about 1900 today preceded by several days of cough productive of yellow sputum.  Patient is reporting left-sided chest pain he attributes to increased coughing.  No fevers, no nausea, no vomiting, no abdominal pain, no lower extremity swelling.  She is requiring supplemental oxygen in the emergency department and does not use home oxygen therapy.      Patient was admitted for a right upper lobe pneumonia on 02/26/2018 with fever.  Symptoms resolved with antibiotic therapy.        Review of Systems     Review of Systems   Constitutional: Negative for chills and fever.   Respiratory: Positive for cough, chest tightness and shortness of breath.    Cardiovascular: Positive for chest pain. Negative for leg swelling.   Gastrointestinal: Positive for nausea. Negative for abdominal pain and vomiting.   Skin: Negative for rash and wound.       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  He has a past surgical history that includes bone marrow biopsy; other surgical history (Left, 08/17/2016); bone marrow transplant; pre-malignant / benign skin lesion excision (Left, 03/29/2017); bronchoscopy (N/A, 02/03/2018); bronchoscopy (02/03/2018); Endoscopy, colon, diagnostic; and skin biopsy.  His family history includes Heart Attack in his brother; Heart Disease in his mother; Lung Cancer in his father.  He reports that he quit smoking about 5 years  ago. His smoking use included cigarettes. He has never used smokeless tobacco. He reports that he drinks alcohol. He reports that he does not use drugs.    Medications     Previous Medications    ALBUTEROL SULFATE HFA (PROAIR HFA) 108 (90 BASE) MCG/ACT INHALER    Inhale 2 puffs into the lungs every 6 hours as needed for Wheezing    AMIODARONE (CORDARONE) 200 MG TABLET    Take 1 tablet by mouth daily    ATORVASTATIN (LIPITOR) 10 MG TABLET    Take 10 mg by mouth nightly     BENZOCAINE-MENTHOL (CEPACOL SORE THROAT) 15-3.6 MG LOZENGE    Take 1 lozenge by mouth every 2 hours as needed for Sore Throat    BISACODYL (DULCOLAX) 5 MG EC TABLET    Take 2 tablets by mouth daily as needed for Constipation    CALCIUM CARBONATE (OSCAL) 500 MG TABS TABLET    Take 500 mg by mouth 2 times daily     DILTIAZEM (CARDIZEM CD) 240 MG EXTENDED RELEASE CAPSULE    Take 1 capsule by mouth daily    FENTANYL (DURAGESIC) 50 MCG/HR    Place 1 patch onto the skin every 72 hours.    FLUTICASONE FUROATE-VILANTEROL (BREO ELLIPTA) 200-25 MCG/INH AEPB    Inhale into the lungs daily    GABAPENTIN (NEURONTIN) 300 MG CAPSULE    Take 300 mg by mouth 3 times daily. Indications: 1 TAB  AM 1 TAB AFTERNOON AND 2 AT PM    ISAVUCONAZONIUM SULFATE 186 MG CAPS    Take 372 mg by mouth daily Take 2 caps 3 times daily x 6 doses, then take daily    LIDOCAINE-PRILOCAINE (EMLA) 2.5-2.5 % CREAM    Apply topically as needed for Pain Apply topically as needed.    ONDANSETRON (ZOFRAN) 4 MG TABLET    Take 1 tablet by mouth every 6 hours as needed for Nausea or Vomiting    OXYCODONE-ACETAMINOPHEN (PERCOCET) 7.5-325 MG PER TABLET    Take 1 tablet by mouth every 4 hours as needed for Pain.Marland Kitchen    PANTOPRAZOLE (PROTONIX) 40 MG TABLET    Take 1 tablet by mouth every morning (before breakfast)    PHOSPHORUS (K PHOS NEUTRAL) 155-852-130 MG TABLET    Take 2 tablets by mouth 2 times daily    POMALIDOMIDE (POMALYST) 2 MG CHEMO CAPSULE    Take 2 mg by mouth daily    PROCHLORPERAZINE  (COMPAZINE) 10 MG TABLET    Take 1 tablet by mouth every 6 hours as needed (Nausea)    SENNOSIDES-DOCUSATE SODIUM (SENOKOT-S) 8.6-50 MG TABLET    Take 2 tablets by mouth daily    SERTRALINE (ZOLOFT) 50 MG TABLET    Take 100 mg by mouth daily     TIOTROPIUM (SPIRIVA HANDIHALER) 18 MCG INHALATION CAPSULE    Inhale 1 capsule into the lungs daily    VALACYCLOVIR (VALTREX) 500 MG TABLET    Take 500 mg by mouth 2 times daily       Allergies     He is allergic to lenalidomide; pomalidomide; and ceclor [cefaclor].    Physical Exam     INITIAL VITALS: BP: 117/66, Temp: 98.5 ??F (36.9 ??C), Pulse: 97, Resp: 22, SpO2: (!) 88 %   Physical Exam   Constitutional: He is cooperative. He has a sickly appearance. Nasal cannula in place.   Chronically ill-appearing male sitting upright in bed, in mild respiratory distress   Eyes: No scleral icterus.   Cardiovascular: Normal rate and regular rhythm. Exam reveals no gallop and no friction rub.   No murmur heard.  Pulmonary/Chest: Accessory muscle usage present. Tachypnea noted. No apnea. No respiratory distress. He has decreased breath sounds (throughout lung fields). He has no wheezes. He has no rhonchi. He has rales in the right lower field and the left lower field.   Abdominal: Soft. There is no tenderness.   Neurological: He is alert.   Vitals reviewed.      DiagnosticResults     EKG   Interpreted in conjunction with emergencydepartment physician Roney Jaffe, *  Rhythm: normal sinus   Rate: normal  Axis: normal  Ectopy: none  Conduction: normal  ST Segments: flattening in  v5, v6, I, III, aVf and aVl  T Waves:flattening in  v5, v6, I and aVl  Q Waves: none  Clinical Impression: non-specific EKG  Comparison:      RADIOLOGY:  XR CHEST STANDARD (2 VW)   Final Result   1.  Worsening airspace opacity seen within the mid to lower lungs which    may be inflammatory or infectious.   2.  Stable opacities seen along the lateral aspect the right upper thorax.       3.  Bilateral  pleural effusions.              LABS:   Results for orders placed or performed during the hospital encounter of 04/17/18   Urinalysis  Result Value Ref Range    Urine Type Voided        RECENT VITALS:  BP: 117/66, Temp: 98.5 ??F (36.9 ??C), Pulse: 90,Resp: 16, SpO2: 97 %       ED Course     Nursing Notes, Past Medical Hx, Past Surgical Hx, Social Hx, Allergies, and Family Hx were reviewed.    The patient was given the followingmedications:  Orders Placed This Encounter   Medications   ??? dextrose 50 % solution     STEINER, MISTY: cabinet override   ??? DISCONTD: lactated ringers bolus   ??? piperacillin-tazobactam (ZOSYN) 3.375 g in dextrose 5 % 100 mL IVPB (mini-bag)   ??? vancomycin (VANCOCIN) 1,500 mg in dextrose 5 % 250 mL IVPB   ??? HYDROmorphone (DILAUDID) injection 0.5 mg   ??? 0.9 % sodium chloride IV bolus 2,421 mL       CONSULTS:  [place] / ASSESSMENT / PLAN     David Watkins is a 62 y.o. male with a past medical history of multiple myeloma currently in treatment, COPD, hypertension, hyperlipidemia presenting with shortness of breath for the last few hours preceded by a productive cough.  Patient was in mild respiratory distress with increased work of breathing and a new oxygen requirements resolved with supplemental oxygen by nasal cannula.  Physical exam is consistent with a pneumonia, patient is borderline tachycardic.  Given that patient is actively being treated for a blood cancer, concern for sepsis is quite high.  We will obtain basic lab work-up as well as lactate, blood cultures and initiate IV fluid with antibiotics.  I have lower suspicion for PE at this time given that his presentation is more consistent with a pneumonia.  Lower suspicion for ACS at this time, EKG and troponin will be obtained however to rule this out ultimately.  Patient's physical exam is more consistent with a pneumonia rather than a COPD exacerbation with absence of wheezing.    Patient's  blood pressures are currently within normal limits and I do not feel he requires pressors at this time.  Patient's lab work-up revealed an anemia that appears to be his baseline (most recent hemoglobins range between 6.8 and 7.4 over the last month).  Chest x-ray revealed bilateral pleural effusion with worsening airspace disease, likely a right middle or lower lobe pneumonia.  Given that he is actively receiving treatment for mild multiple myeloma, broad-spectrum antibiotics initiated as well as a fluid bolus of 30 cc/kg.  Patient will be admitted to oncology for IV antibiotics.    This patient was also evaluated by the attending physician. All care plans werediscussed and agreed upon.    Clinical Impression     1. Pleural effusion    2. Shortness of breath    3. Pneumonia due to organism        Disposition     PATIENT REFERRED TO:  No follow-up provider specified.    DISCHARGE MEDICATIONS:  New Prescriptions    No medications on file       DISPOSITION admit to oncology       Kathlen Brunswick, MD  Resident  04/18/18 [phone]

## 2018-04-18 ENCOUNTER — Inpatient Hospital Stay
Admission: EM | Admit: 2018-04-18 | Discharge: 2018-04-19 | Disposition: A | Discharge status: Hospice | Payer: BLUE CROSS/BLUE SHIELD | Source: Other Acute Inpatient Hospital | Admitting: Hematology & Oncology

## 2018-04-18 ENCOUNTER — Inpatient Hospital Stay: Admit: 2018-04-18 | Payer: BLUE CROSS/BLUE SHIELD | Primary: Hematology & Oncology

## 2018-04-18 ENCOUNTER — Emergency Department: Admit: 2018-04-18 | Payer: BLUE CROSS/BLUE SHIELD | Primary: Hematology & Oncology

## 2018-04-18 LAB — BLOOD GAS, VENOUS
Base Excess, Ven: 2.6 mmol/L (ref <=3.0)
Carboxyhemoglobin: 1.8 % — ABNORMAL HIGH (ref 0.0–1.5)
HCO3, Venous: 26.5 mmol/L (ref 24.0–28.0)
Hemoglobin, Ven, Reduced: 42.1 %
MetHgb, Ven: 0.9 % (ref 0.0–1.5)
O2 Sat, Ven: 57 %
TC02 (Calc), Ven: 28 mmol/L
pCO2, Ven: 40.2 mmHg — ABNORMAL LOW (ref 41.0–51.0)
pH, Ven: 7.435 (ref 7.350–7.450)
pO2, Ven: 31.9 mmHg (ref 25.0–40.0)

## 2018-04-18 LAB — CBC WITH AUTO DIFFERENTIAL
Bands Relative: 6 % (ref 0–7)
Basophils %: 0 %
Basophils Absolute: 0 10*3/uL (ref 0.0–0.2)
Blasts Relative: 2 % — AB
Eosinophils %: 0 %
Eosinophils Absolute: 0 10*3/uL (ref 0.0–0.6)
Hematocrit: 21 % — ABNORMAL LOW (ref 40.5–52.5)
Hemoglobin: 7 g/dL — ABNORMAL LOW (ref 13.5–17.5)
Lymphocytes %: 15 %
Lymphocytes Absolute: 1.1 10*3/uL (ref 1.0–5.1)
MCH: 33.7 pg (ref 26.0–34.0)
MCHC: 33.5 g/dL (ref 31.0–36.0)
MCV: 100.6 fL — ABNORMAL HIGH (ref 80.0–100.0)
MPV: 8 fL (ref 5.0–10.5)
Metamyelocytes Relative: 5 % — AB
Monocytes %: 11 %
Monocytes Absolute: 0.8 10*3/uL (ref 0.0–1.3)
Myelocyte Percent: 1 % — AB
Neutrophils %: 60 %
Neutrophils Absolute: 5.2 10*3/uL (ref 1.7–7.7)
PLATELET SLIDE REVIEW: DECREASED
Platelets: 66 10*3/uL — ABNORMAL LOW (ref 135–450)
RBC: 2.08 M/uL — ABNORMAL LOW (ref 4.20–5.90)
RDW: 25.1 % — ABNORMAL HIGH (ref 12.4–15.4)
WBC: 7.2 10*3/uL (ref 4.0–11.0)

## 2018-04-18 LAB — COMPREHENSIVE METABOLIC PANEL W/ REFLEX TO MG FOR LOW K
ALT: 14 U/L (ref 10–40)
AST: 43 U/L — ABNORMAL HIGH (ref 15–37)
Albumin/Globulin Ratio: 0.3 — ABNORMAL LOW (ref 1.1–2.2)
Albumin: 2.7 g/dL — ABNORMAL LOW (ref 3.4–5.0)
Alkaline Phosphatase: 86 U/L (ref 40–129)
Anion Gap: 11 (ref 3–16)
BUN: 18 mg/dL (ref 7–20)
CO2: 25 mmol/L (ref 21–32)
Calcium: 9 mg/dL (ref 8.3–10.6)
Chloride: 99 mmol/L (ref 99–110)
Creatinine: 0.9 mg/dL (ref 0.8–1.3)
GFR African American: >60 (ref >60)
GFR Non-African American: >60 (ref >60)
Globulin: 8.2 g/dL
Glucose: 109 mg/dL — ABNORMAL HIGH (ref 70–99)
Potassium reflex Magnesium: 4.1 mmol/L (ref 3.5–5.1)
Sodium: 135 mmol/L — ABNORMAL LOW (ref 136–145)
Total Bilirubin: 0.4 mg/dL (ref 0.0–1.0)
Total Protein: 10.9 g/dL — ABNORMAL HIGH (ref 6.4–8.2)

## 2018-04-18 LAB — LACTIC ACID: Lactic Acid: 3.6 mmol/L — ABNORMAL HIGH (ref 0.4–2.0)

## 2018-04-18 LAB — URINALYSIS
Bilirubin Urine: NEGATIVE
Blood, Urine: NEGATIVE
Glucose, Ur: NEGATIVE mg/dL
Ketones, Urine: NEGATIVE mg/dL
Leukocyte Esterase, Urine: NEGATIVE
Nitrite, Urine: NEGATIVE
Specific Gravity, UA: 1.01 (ref 1.005–1.030)
Urobilinogen, Urine: 0.2 E.U./dL (ref <2.0)
pH, UA: 7.5 (ref 5.0–8.0)

## 2018-04-18 LAB — RESPIRATORY PANEL FILM ARRAY REPORT

## 2018-04-18 LAB — POCT VENOUS: Lactate: 2.62 mmol/L — ABNORMAL HIGH (ref 0.40–2.00)

## 2018-04-18 LAB — PROCALCITONIN: Procalcitonin: 0.65 ng/mL — ABNORMAL HIGH (ref 0.00–0.15)

## 2018-04-18 LAB — EKG 12-LEAD
Atrial Rate: 93 {beats}/min
P Axis: 67 degrees
P-R Interval: 128 ms
Q-T Interval: 372 ms
QRS Duration: 92 ms
QTc Calculation (Bazett): 462 ms
R Axis: 58 degrees
T Axis: 41 degrees
Ventricular Rate: 93 {beats}/min

## 2018-04-18 LAB — MICROSCOPIC URINALYSIS

## 2018-04-18 LAB — AMMONIA: Ammonia: 88 umol/L — ABNORMAL HIGH (ref 16–60)

## 2018-04-18 LAB — RESPIRATORY PANEL, MOLECULAR: Respiratory Panel PCR: NOT DETECTED

## 2018-04-18 LAB — TYPE AND SCREEN
ABO/Rh: O POS
Antibody Screen: NEGATIVE

## 2018-04-18 LAB — PREPARE RBC (CROSSMATCH): Dispense Status Blood Bank: TRANSFUSED

## 2018-04-18 LAB — TROPONIN: Troponin: <0.01 ng/mL (ref <0.01)

## 2018-04-18 MED ORDER — BUDESONIDE 0.5 MG/2ML IN SUSP
0.5 MG/2ML | Freq: Two times a day (BID) | RESPIRATORY_TRACT | Status: DC
Start: 2018-04-18 — End: 2018-04-19
  Administered 2018-04-18 (×2): 500 mg via RESPIRATORY_TRACT

## 2018-04-18 MED ORDER — MAGNESIUM SULFATE 4000 MG/100 ML IVPB PREMIX
4 GM/100ML | INTRAVENOUS | Status: DC | PRN
Start: 2018-04-18 — End: 2018-04-19

## 2018-04-18 MED ORDER — LACTATED RINGERS IV BOLUS
Freq: Once | INTRAVENOUS | Status: DC
Start: 2018-04-18 — End: 2018-04-18

## 2018-04-18 MED ORDER — FENTANYL 50 MCG/HR TD PT72
50 MCG/HR | TRANSDERMAL | Status: DC
Start: 2018-04-18 — End: 2018-04-19
  Administered 2018-04-19: 01:00:00 1 via TRANSDERMAL

## 2018-04-18 MED ORDER — DEXTROSE 5 % IV SOLN
5 % | Freq: Two times a day (BID) | INTRAVENOUS | Status: DC
Start: 2018-04-18 — End: 2018-04-19
  Administered 2018-04-18 – 2018-04-19 (×2): 1250 mg via INTRAVENOUS

## 2018-04-18 MED ORDER — PANTOPRAZOLE SODIUM 40 MG PO TBEC
40 MG | Freq: Every day | ORAL | Status: DC
Start: 2018-04-18 — End: 2018-04-19
  Administered 2018-04-18 – 2018-04-19 (×2): 40 mg via ORAL

## 2018-04-18 MED ORDER — FLUTICASONE FUROATE-VILANTEROL 200-25 MCG/INH IN AEPB
200-25 MCG/INH | Freq: Two times a day (BID) | RESPIRATORY_TRACT | Status: DC
Start: 2018-04-18 — End: 2018-04-18

## 2018-04-18 MED ORDER — OXYCODONE-ACETAMINOPHEN 7.5-325 MG PO TABS
ORAL | Status: DC | PRN
Start: 2018-04-18 — End: 2018-04-19
  Administered 2018-04-18 (×2): 1 via ORAL

## 2018-04-18 MED ORDER — SODIUM CHLORIDE 0.9 % IV SOLN
0.9 % | INTRAVENOUS | Status: DC
Start: 2018-04-18 — End: 2018-04-19
  Administered 2018-04-18: 12:00:00 via INTRAVENOUS

## 2018-04-18 MED ORDER — MAGNESIUM HYDROXIDE 400 MG/5ML PO SUSP
400 MG/5ML | Freq: Every day | ORAL | Status: DC | PRN
Start: 2018-04-18 — End: 2018-04-19

## 2018-04-18 MED ORDER — POTASSIUM CHLORIDE 20 MEQ/50ML IV SOLN
20 MEQ/50ML | INTRAVENOUS | Status: DC | PRN
Start: 2018-04-18 — End: 2018-04-19

## 2018-04-18 MED ORDER — DILTIAZEM HCL ER COATED BEADS 240 MG PO CP24
240 MG | Freq: Every day | ORAL | Status: DC
Start: 2018-04-18 — End: 2018-04-19
  Administered 2018-04-18 – 2018-04-19 (×2): 240 mg via ORAL

## 2018-04-18 MED ORDER — PIPERACILLIN SOD-TAZOBACTAM SO 3.375 (3-0.375) G IV SOLR
3.3753-0.375 (3-0.375) g | Freq: Three times a day (TID) | INTRAVENOUS | Status: DC
Start: 2018-04-18 — End: 2018-04-18

## 2018-04-18 MED ORDER — ISAVUCONAZONIUM SULFATE 186 MG PO CAPS
186 MG | Freq: Every day | ORAL | Status: DC
Start: 2018-04-18 — End: 2018-04-19
  Administered 2018-04-18 – 2018-04-19 (×2): 372 mg via ORAL

## 2018-04-18 MED ORDER — VALACYCLOVIR HCL 500 MG PO TABS
500 MG | Freq: Two times a day (BID) | ORAL | Status: DC
Start: 2018-04-18 — End: 2018-04-19
  Administered 2018-04-18 – 2018-04-19 (×3): 500 mg via ORAL

## 2018-04-18 MED ORDER — BISACODYL 5 MG PO TBEC
5 MG | Freq: Every day | ORAL | Status: DC | PRN
Start: 2018-04-18 — End: 2018-04-19

## 2018-04-18 MED ORDER — ATORVASTATIN CALCIUM 20 MG PO TABS
20 MG | Freq: Every evening | ORAL | Status: DC
Start: 2018-04-18 — End: 2018-04-19
  Administered 2018-04-19: 01:00:00 10 mg via ORAL

## 2018-04-18 MED ORDER — NORMAL SALINE FLUSH 0.9 % IV SOLN
0.9 % | INTRAVENOUS | Status: DC | PRN
Start: 2018-04-18 — End: 2018-04-19

## 2018-04-18 MED ORDER — ALTEPLASE 2 MG IJ SOLR
2 MG | INTRAMUSCULAR | Status: DC | PRN
Start: 2018-04-18 — End: 2018-04-19

## 2018-04-18 MED ORDER — ENOXAPARIN SODIUM 40 MG/0.4ML SC SOLN
40 MG/0.4ML | Freq: Every day | SUBCUTANEOUS | Status: DC
Start: 2018-04-18 — End: 2018-04-19
  Administered 2018-04-18: 22:00:00 40 mg via SUBCUTANEOUS

## 2018-04-18 MED ORDER — OYSTER SHELL CALCIUM 500 MG PO TABS
500 MG | Freq: Two times a day (BID) | ORAL | Status: DC
Start: 2018-04-18 — End: 2018-04-19
  Administered 2018-04-18 – 2018-04-19 (×3): 500 mg via ORAL

## 2018-04-18 MED ORDER — DEXTROSE 5 % IV SOLN (MINI-BAG)
5 % | Freq: Once | INTRAVENOUS | Status: AC
Start: 2018-04-18 — End: 2018-04-18
  Administered 2018-04-18: 06:00:00 3.375 g via INTRAVENOUS

## 2018-04-18 MED ORDER — SALINE MOUTHWASH
Status: DC | PRN
Start: 2018-04-18 — End: 2018-04-19

## 2018-04-18 MED ORDER — HYDROMORPHONE HCL 1 MG/ML IJ SOLN
1 MG/ML | Freq: Once | INTRAMUSCULAR | Status: AC
Start: 2018-04-18 — End: 2018-04-18
  Administered 2018-04-18: 06:00:00 0.5 mg via INTRAVENOUS

## 2018-04-18 MED ORDER — SERTRALINE HCL 100 MG PO TABS
100 MG | Freq: Every day | ORAL | Status: DC
Start: 2018-04-18 — End: 2018-04-19
  Administered 2018-04-18 – 2018-04-19 (×2): 100 mg via ORAL

## 2018-04-18 MED ORDER — LORAZEPAM 2 MG/ML IJ SOLN
2 MG/ML | Freq: Once | INTRAMUSCULAR | Status: AC
Start: 2018-04-18 — End: 2018-04-18
  Administered 2018-04-18: 15:00:00 1 mg via INTRAVENOUS

## 2018-04-18 MED ORDER — ALBUTEROL SULFATE (2.5 MG/3ML) 0.083% IN NEBU
RESPIRATORY_TRACT | Status: AC
Start: 2018-04-18 — End: 2018-04-18

## 2018-04-18 MED ORDER — ONDANSETRON HCL 4 MG PO TABS
4 MG | Freq: Four times a day (QID) | ORAL | Status: DC | PRN
Start: 2018-04-18 — End: 2018-04-19

## 2018-04-18 MED ORDER — DEXTROSE 5 % IV SOLN (MINI-BAG)
5 % | Freq: Four times a day (QID) | INTRAVENOUS | Status: DC
Start: 2018-04-18 — End: 2018-04-19
  Administered 2018-04-18 – 2018-04-19 (×5): 3.375 g via INTRAVENOUS

## 2018-04-18 MED ORDER — TIOTROPIUM BROMIDE MONOHYDRATE 18 MCG IN CAPS
18 MCG | Freq: Every day | RESPIRATORY_TRACT | Status: DC
Start: 2018-04-18 — End: 2018-04-19
  Administered 2018-04-18 – 2018-04-19 (×2): 18 ug via RESPIRATORY_TRACT

## 2018-04-18 MED ORDER — SENNA-DOCUSATE SODIUM 8.6-50 MG PO TABS
Freq: Every day | ORAL | Status: DC
Start: 2018-04-18 — End: 2018-04-19
  Administered 2018-04-18 – 2018-04-19 (×2): 2 via ORAL

## 2018-04-18 MED ORDER — MORPHINE SULFATE 4 MG/ML IJ SOLN
4 MG/ML | INTRAMUSCULAR | Status: DC | PRN
Start: 2018-04-18 — End: 2018-04-19
  Administered 2018-04-19 (×4): 4 mg via INTRAVENOUS

## 2018-04-18 MED ORDER — ALBUTEROL SULFATE (2.5 MG/3ML) 0.083% IN NEBU
Freq: Four times a day (QID) | RESPIRATORY_TRACT | Status: DC | PRN
Start: 2018-04-18 — End: 2018-04-18

## 2018-04-18 MED ORDER — ALBUTEROL SULFATE (2.5 MG/3ML) 0.083% IN NEBU
RESPIRATORY_TRACT | Status: AC
Start: 2018-04-18 — End: 2018-04-18
  Administered 2018-04-18: 08:00:00 2.5

## 2018-04-18 MED ORDER — DEXTROSE 50 % IV SOLN
50 % | INTRAVENOUS | Status: AC
Start: 2018-04-18 — End: 2018-04-18

## 2018-04-18 MED ORDER — SODIUM CHLORIDE 0.9% BOLUS (FLUID RESUSCITATION)
0.9 % | Freq: Once | INTRAVENOUS | Status: AC
Start: 2018-04-18 — End: 2018-04-18
  Administered 2018-04-18: 06:00:00 2421 mL/kg via INTRAVENOUS

## 2018-04-18 MED ORDER — ALBUTEROL SULFATE (2.5 MG/3ML) 0.083% IN NEBU
Freq: Four times a day (QID) | RESPIRATORY_TRACT | Status: DC
Start: 2018-04-18 — End: 2018-04-19
  Administered 2018-04-18 – 2018-04-19 (×5): 2.5 mg via RESPIRATORY_TRACT

## 2018-04-18 MED ORDER — ALBUTEROL SULFATE (2.5 MG/3ML) 0.083% IN NEBU
Freq: Four times a day (QID) | RESPIRATORY_TRACT | Status: DC | PRN
Start: 2018-04-18 — End: 2018-04-18
  Administered 2018-04-18 (×2): 2.5 mg via RESPIRATORY_TRACT

## 2018-04-18 MED ORDER — GABAPENTIN 300 MG PO CAPS
300 MG | Freq: Three times a day (TID) | ORAL | Status: DC
Start: 2018-04-18 — End: 2018-04-19
  Administered 2018-04-18 – 2018-04-19 (×3): 300 mg via ORAL

## 2018-04-18 MED ORDER — ALBUTEROL SULFATE HFA 108 (90 BASE) MCG/ACT IN AERS
108 (90 Base) MCG/ACT | Freq: Four times a day (QID) | RESPIRATORY_TRACT | Status: DC | PRN
Start: 2018-04-18 — End: 2018-04-18

## 2018-04-18 MED ORDER — SODIUM CHLORIDE 0.9 % IV SOLN
0.9 % | INTRAVENOUS | Status: DC
Start: 2018-04-18 — End: 2018-04-18
  Administered 2018-04-18: 09:00:00 1000 mL via INTRAVENOUS

## 2018-04-18 MED ORDER — FORMOTEROL FUMARATE 20 MCG/2ML IN NEBU
20 MCG/2ML | Freq: Two times a day (BID) | RESPIRATORY_TRACT | Status: DC
Start: 2018-04-18 — End: 2018-04-19
  Administered 2018-04-18 (×2): 20 ug via RESPIRATORY_TRACT

## 2018-04-18 MED ORDER — VANCOMYCIN HCL 1 G IV SOLR
1 | Freq: Once | INTRAVENOUS | Status: AC
Start: 2018-04-18 — End: 2018-04-18
  Administered 2018-04-18: 06:00:00 1500 mg via INTRAVENOUS

## 2018-04-18 MED ORDER — SALINE MOUTHWASH
Freq: Four times a day (QID) | Status: DC
Start: 2018-04-18 — End: 2018-04-19

## 2018-04-18 MED ORDER — NORMAL SALINE FLUSH 0.9 % IV SOLN
0.9 % | Freq: Two times a day (BID) | INTRAVENOUS | Status: DC
Start: 2018-04-18 — End: 2018-04-19
  Administered 2018-04-18 – 2018-04-19 (×3): 10 mL via INTRAVENOUS

## 2018-04-18 MED ORDER — PROCHLORPERAZINE MALEATE 10 MG PO TABS
10 MG | Freq: Four times a day (QID) | ORAL | Status: DC | PRN
Start: 2018-04-18 — End: 2018-04-19

## 2018-04-18 MED ORDER — LIDOCAINE-PRILOCAINE 2.5-2.5 % EX CREA
CUTANEOUS | Status: DC | PRN
Start: 2018-04-18 — End: 2018-04-19

## 2018-04-18 MED ORDER — MORPHINE SULFATE (PF) 2 MG/ML IV SOLN
2 MG/ML | INTRAVENOUS | Status: DC | PRN
Start: 2018-04-18 — End: 2018-04-19

## 2018-04-18 MED ORDER — SODIUM CHLORIDE 0.9 % IV BOLUS
0.9 % | Freq: Once | INTRAVENOUS | Status: AC
Start: 2018-04-18 — End: 2018-04-18
  Administered 2018-04-18: 10:00:00 250 mL via INTRAVENOUS

## 2018-04-18 MED ORDER — AMIODARONE HCL 200 MG PO TABS
200 MG | Freq: Every day | ORAL | Status: DC
Start: 2018-04-18 — End: 2018-04-19
  Administered 2018-04-18 – 2018-04-19 (×2): 200 mg via ORAL

## 2018-04-18 MED ORDER — SODIUM CHLORIDE 0.9 % IV SOLN
0.9 % | INTRAVENOUS | Status: AC
Start: 2018-04-18 — End: 2018-04-18

## 2018-04-18 MED FILL — BUDESONIDE 0.5 MG/2ML IN SUSP: 0.5 MG/2ML | RESPIRATORY_TRACT | Qty: 2

## 2018-04-18 MED FILL — PERFOROMIST 20 MCG/2ML IN NEBU: 20 MCG/2ML | RESPIRATORY_TRACT | Qty: 2

## 2018-04-18 MED FILL — LORAZEPAM 2 MG/ML IJ SOLN: 2 mg/mL | INTRAMUSCULAR | Qty: 1

## 2018-04-18 MED FILL — AMIODARONE HCL 200 MG PO TABS: 200 mg | ORAL | Qty: 1

## 2018-04-18 MED FILL — OXYCODONE-ACETAMINOPHEN 7.5-325 MG PO TABS: 7.5-325 mg | ORAL | Qty: 1

## 2018-04-18 MED FILL — HYDROMORPHONE HCL 1 MG/ML IJ SOLN: 1 mg/mL | INTRAMUSCULAR | Qty: 1

## 2018-04-18 MED FILL — CRESEMBA 186 MG PO CAPS: 186 mg | ORAL | Qty: 2

## 2018-04-18 MED FILL — ALBUTEROL SULFATE (2.5 MG/3ML) 0.083% IN NEBU: RESPIRATORY_TRACT | Qty: 3

## 2018-04-18 MED FILL — VANCOMYCIN HCL 10 G IV SOLR: 10 g | INTRAVENOUS | Qty: 1250

## 2018-04-18 MED FILL — SERTRALINE HCL 100 MG PO TABS: 100 mg | ORAL | Qty: 1

## 2018-04-18 MED FILL — SPIRIVA HANDIHALER 18 MCG IN CAPS: 18 ug | RESPIRATORY_TRACT | Qty: 5

## 2018-04-18 MED FILL — ZOSYN 3.375 (3-0.375) G IV SOLR: 3.375 (3-0.375) g | INTRAVENOUS | Qty: 3.38

## 2018-04-18 MED FILL — DILTIAZEM HCL ER COATED BEADS 240 MG PO CP24: 240 mg | ORAL | Qty: 1

## 2018-04-18 MED FILL — SODIUM CHLORIDE 0.9 % IV SOLN: 0.9 % | INTRAVENOUS | Qty: 3000

## 2018-04-18 MED FILL — LOVENOX 40 MG/0.4ML SC SOLN: 40 MG/0.4ML | SUBCUTANEOUS | Qty: 0.4

## 2018-04-18 MED FILL — SODIUM CHLORIDE 0.9 % IV SOLN: 0.9 % | INTRAVENOUS | Qty: 250

## 2018-04-18 MED FILL — SENNOSIDES-DOCUSATE SODIUM 8.6-50 MG PO TABS: 8.6-50 mg | ORAL | Qty: 2

## 2018-04-18 MED FILL — LACTATED RINGERS IV SOLN: INTRAVENOUS | Qty: 3000

## 2018-04-18 MED FILL — PANTOPRAZOLE SODIUM 40 MG PO TBEC: 40 mg | ORAL | Qty: 1

## 2018-04-18 MED FILL — OYSTER SHELL CALCIUM 500 MG PO TABS: 500 mg | ORAL | Qty: 1

## 2018-04-18 MED FILL — GABAPENTIN 300 MG PO CAPS: 300 mg | ORAL | Qty: 1

## 2018-04-18 MED FILL — STIMULANT LAXATIVE 5 MG PO TBEC: 5 mg | ORAL | Qty: 2

## 2018-04-18 MED FILL — DEXTROSE 50 % IV SOLN: 50 % | INTRAVENOUS | Qty: 50

## 2018-04-18 MED FILL — VANCOMYCIN HCL 10 G IV SOLR: 10 g | INTRAVENOUS | Qty: 1500

## 2018-04-18 MED FILL — VALACYCLOVIR HCL 500 MG PO TABS: 500 mg | ORAL | Qty: 1

## 2018-04-18 NOTE — Progress Notes (Signed)
VSS. Reassessment completed. Blood drawn from line. Flushed with normal saline. Sample sent to lab. Pt resting comfortably in bed. Call light within reach. Bed alarm activated. Denies further needs at this time. Will continue to monitor. Electronically signed by Regis Bill, RN on 04/18/2018 at 5:13 PM

## 2018-04-18 NOTE — Care Coordination-Inpatient (Signed)
PC NP and NP let SW know of patient and SO interest in hospice.  Patient was too sedated this afternoon for SW to talk with him.  SO asked if they could meet tomorrow so patient would be more alert.    Choices of [facility] and [facility] were given to SO.  [facility] was selected.  Referral called and faxed to [facility].  Meeting will be Tuesday @ 10:00 am in patient room.    Freddi Starr, MSW, [place]

## 2018-04-18 NOTE — Progress Notes (Signed)
Physical Therapy  HOLD    PT referral received, chart reviewed.  Pt on hold presently per nursing due to pt and family discussing hospice.  Will check back 04/19/18.    [place] [phone]

## 2018-04-18 NOTE — Consults (Signed)
Clinical Pharmacy Consult Note    Admit date: 04/17/2018    Subjective/Objective:  62 yo M with PMH of IgG Kappa MM/Plasma Cell leukemia, HTN, HLD, COPD (recent [July 2019] exacerbation with PNA, treated with zosyn inpatient, discharged on Ciprofloxacin for total of 14 day coverage) admitted today for shortness of breath and pneumonia.   Pharmacy is consulted to dose Vancomycin per Dr. Corky Sox    Pertinent Medications:  Zosyn 3.375g IV q6 hours -- day #1  Vancomycin -- pharmacy to dose -- day # 1   Vanc 1.5g IV x1 in ED    PERTINENT LABS:  Recent Labs     04/18/18  0149   NA 135*   K 4.1   CL 99   CO2 25   BUN 18   CREATININE 0.9       Estimated Creatinine Clearance: 84 mL/min (based on SCr of 0.9 mg/dL).     Recent Labs     04/18/18  0149   WBC 7.2   HGB 7.0*   HCT 21.0*   MCV 100.6*   PLT 66*       Height:  5' 7.99" (172.7 cm)  Weight: 179 lb 14.3 oz (81.6 kg)    Blood cultures pending      Assessment/Plan:  1. Pneumonia: zosyn + vancomycin  Vancomycin  ?? Will start vancomycin 1250 mg IV q12 hours.  Provides ~ 15 mg/kg and is based on a half-life elimination of 9-10 hours.  ?? Clinical pharmacist will follow-up in AM.  ?? Renal function will be monitored closely and dosing will be adjusted as appropriate.    Please call with any questions.    Thank you for consulting pharmacy!  Hayes Ludwig, RPh 04/18/2018, 5:29 AM

## 2018-04-18 NOTE — ED Notes (Signed)
Report to [place], RN  04/18/18 [phone]

## 2018-04-18 NOTE — Progress Notes (Signed)
Lactic acid resulted as 2.62. Pt received 2 1L bolus in ED. BP stable and no s/s SIRS criteria noted. Dr. Pearlean Brownie notified- no new orders at this time. Will continue to monitor.

## 2018-04-18 NOTE — Progress Notes (Signed)
RESPIRATORY THERAPY ASSESSMENT    Name:  Ehrenberg Record Number:  [id number]  Age: 62 y.o.   Gender: male  DOB: 11/28/1955  Today's Date:  04/18/2018  Room:  3506/3506-01    Assessment     Is the patient being admitted for a COPD or Asthma exacerbation?  No   (If yes the patient will be seen every 4 hours for the first 24 hours and then reassessed)    Patient Admission Diagnosis      Allergies  Allergies   Allergen Reactions   ??? Lenalidomide Rash   ??? Pomalidomide Hives   ??? Ceclor [Cefaclor] Hives       Minimum Predicted Vital Capacity:     1050          Actual Vital Capacity:      Pt in distress at this time              Pulmonary History:COPD  Home Oxygen Therapy:  room air  Home Respiratory Therapy:Albuterol and Tiotropium Bromide   Current Respiratory Therapy:  hhn albuterol qid, mdi spiriva daily  Treatment Type: HHN  Medications: Albuterol    Respiratory Severity Index(RSI)   Patients with orders for inhalation medications, oxygen, or any therapeutic treatment modality will be placed on Respiratory Protocol.  They will be assessed with the first treatment and at least every 72 hours thereafter.  The following severity scale will be used to determine frequency of treatment intervention.    Smoking History: Mild Exacerbation = 3    Social History  Social History     Tobacco Use   ??? Smoking status: Former Smoker     Types: Cigarettes     Last attempt to quit: 11/25/2012     Years since quitting: 5.3   ??? Smokeless tobacco: Never Used   Substance Use Topics   ??? Alcohol use: Yes     Comment: socially   ??? Drug use: No       Recent Surgical History: None = 0  Past Surgical History  Past Surgical History:   Procedure Laterality Date   ??? BONE MARROW BIOPSY     ??? BONE MARROW TRANSPLANT     ??? BRONCHOSCOPY N/A 02/03/2018    BRONCHOSCOPY WITH BAL, CHOICE ANESTHESIA performed by Neville Route, MD at [facility]   ??? BRONCHOSCOPY  02/03/2018    BRONCHOSCOPY THERAPUTIC ASPIRATION SUBSEQUENT performed by Neville Route, MD at [facility]   ??? ENDOSCOPY, COLON, DIAGNOSTIC     ??? OTHER SURGICAL HISTORY Left 08/17/2016    trifusion cath placement   ??? PRE-MALIGNANT / BENIGN SKIN LESION EXCISION Left 03/29/2017    EXCISE LESION LEFT EXTERNAL EAR WITH FROZEN SECTION, FULL THICKNESS SKIN GRAFT   ??? SKIN BIOPSY      left ear       Level of Consciousness: Alert, Oriented, and Cooperative = 0    Level of Activity: Mostly sedentary, minimal walking = 2    Respiratory Pattern: Increased; RR 21-30 = 1    Breath Sounds: Diminshed bilaterally and/or crackles = 2    Sputum   ,  ,    Cough: Strong, spontaneous, non-productive = 0    Vital Signs   BP 129/85    Pulse 105    Temp 97.5 ??F (36.4 ??C) (Oral)    Resp 24    Ht 5' 7.99" (1.727 m)    Wt 179 lb 14.3 oz (81.6 kg)    SpO2  97%    BMI 27.36 kg/m??   SPO2 (COPD values may differ): 86-87% on room air or greater than 92% on FiO2 35- 50% = 3    Peak Flow (asthma only): not applicable = 0    RSI: 62-22 = Q6H or QID and Q4HPRN for dyspnea        Plan       Goals: medication delivery and improve oxygenation    Patient/caregiver was educated on the proper method of use for Respiratory Care Devices:  Yes      Level of patient/caregiver understanding able to:   ? Verbalize understanding   ? Demonstrate understanding       ? Teach back        ? Needs reinforcement       ?  No available caregiver               ?  Other:     Response to education:  Excellent     Is patient being placed on Home Treatment Regimen?  No     Does the patient have everything they need prior to discharge?  Yes     Comments: admits with pneumonia    Plan of Care: hhn albuterol qid, mdi spiriva daily    Electronically signed by Tyson Babinski, RCP on 04/18/2018 at 4:26 AM    Respiratory Protocol Guidelines     1. Assessment and treatment by Respiratory Therapy will be initiated for medication and therapeutic interventions upon initiation of aerosolized medication.  2. Physician will be contacted for respiratory rate (RR)  greater than 35 breaths per minute. Therapy will be held for heart rate (HR) greater than 140 beats per minute, pending direction from physician.  3. Bronchodilators will be administered via Metered Dose Inhaler (MDI) with spacer when the following criteria are met:  a. Alert and cooperative     b. HR < 140 bpm  c. RR < 30 bpm                d. Can demonstrate a 2-3 second inspiratory hold  4. Bronchodilators will be administered via Hand Held Nebulizer [facility]) to patients when ANY of the following criteria are met  a. Incognizant or uncooperative          b. Patients treated with HHN at Home        c. Unable to demonstrate proper use of MDI with spacer     d. RR > 30 bpm   5. Bronchodilators will be delivered via Metered Dose Inhaler (MDI), HHN, Aerogen to intubated patients on mechanical ventilation.  6. Inhalation medication orders will be delivered and/or substituted as outlined below.    Aerosolized Medications Ordering and Administration Guidelines:    1. All Medications will be ordered by a physician, and their frequency and/or modality will be adjusted as defined by the patients Respiratory Severity Index (RSI) score.  2. If the patient does not have documented COPD, consider discontinuing anticholinergics when RSI is less than 9.  3. If the bronchospasm worsens (increased RSI), then the bronchodilator frequency can be increased to a maximum of every 4 hours.  If greater than every 4 hours is required, the physician will be contacted.  4. If the bronchospasm improves, the frequency of the bronchodilator can be decreased, based on the patient's RSI, but not less than home treatment regimen frequency.  5. Bronchodilator(s) will be discontinued if patient has a RSI less than 9 and has received no scheduled or  as needed treatment for 72  Hrs.    Patients Ordered on a Mucolytic Agent:    1. Must always be administered with a bronchodilator.    2. Discontinue if patient experiences worsened bronchospasm, or  secretions have lessened to the point that the patient is able to clear them with a cough.    Anti-inflammatory and Combination Medications:    1. If the patient lacks prior history of lung disease, is not using inhaled anti-inflammatory medication at home, and lacks wheezing by examination or by history for at least 24 hours, contact physician for possible discontinuation.

## 2018-04-18 NOTE — Progress Notes (Signed)
VSS. Assessment completed. Scheduled meds given at this time. Assisted pt to bedside to urinate. Pt short of breath with exertion. Pt resting comfortably in bed. Call light within reach. Bed alarm activated. Denies further needs at this time. Will continue to monitor. Electronically signed by Regis Bill, RN on 04/18/2018 at 3:33 PM

## 2018-04-18 NOTE — ED Notes (Signed)
Lab to draw lactic acid and second set of cultures as original lactic acid drawn was not added on      Garen Lah, RN  04/18/18 [phone]

## 2018-04-18 NOTE — Consults (Signed)
[facility]  Palliative Medicine Consultation Note      Date Of Admission:04/17/2018  Date of consult: 04/18/18  Seen by [facility] in the past:  No    Recommendations:        Pt is lethargic and unable to answer questions at this time. Spoke with significant other David Watkins at the bedside and introduced palliative care. David Watkins is understanding of pt's poor prognosis and says pt wants to be comfortable and at home. She says he would not want resuscitation or intubation. She is agreeable to hospice. Explained the hospice philosophy and services, and offered choice of hospice. She does not have a preference of hospice agency. David Watkins is pt's alternate POA, and she did not want to contact pt's first POA, brother David Watkins who lives in [place], until she talks with Dr. Corky Watkins. At this time, code status will not be able to be changed until pt is able to make his own decisions or until David Watkins either agrees to code status change or defers decision making to [name]. D/w SW David Watkins and RN David Watkins. Provided emotional support to [facility].    1. Goals of Care/Advanced Care planning/Code status: Full Code, discussed today. David Watkins is agreeable to meeting with hospice. Will need to discuss with either pt or brother/poa David Watkins.  2. Pain: pt appears comfortable at this time. David Watkins reports chronic pain in torso/abdomen for which he has a fentanyl patch and takes percocet prn. He has morphine and percocet available prn, has received 2 doses of percocet today.  3. SOB: pt c/o sob per David Watkins's report, with associated anxiety. He has mildly labored breathing on 4 L oxygen this afternoon. Being treated for pneumonia, with pulm following. Recently treated for pneumonia.  4. Anxiety: David Watkins reports pt becomes anxious especially when sob, and received ativan earlier today. He is resting comfortably now but is difficult to arouse.  5. Disposition: d/c home with hospice when medically ready    Reason for Consult:         __x___ Goals of  Care  __x___ Code Status Discussion/Advanced Care Planning   __x___ Psychosocial/Family Support  __x___ Symptom Management  _____ Other David Watkins)    Requesting Physician: Dr. Lambert Mody APRN    CHIEF COMPLAINT: SOB    History Obtained From:  domestic partner, electronic medical record    History of Present Illness:         David Watkins is a 62 year old male with PMH of COPD, HLD, relapsed IgG Kappa Multiple Myeloma/Plasma Cell Leukemia who presented with shortness of breath. He has most recently been treated with Elo/Pom/Dex, last treatment 02/18/18. He follows with David Watkins for his COPD and recently was on a steroid taper for COPD exacerbation. He was treated for right upper lobe pneumonia 6/29. When he presented 8/18 with shortness of breath and was requiring supplemental oxygen, he was admitted with concern for pneumonia and sepsis.    Subjective:                     Past Medical History:        Diagnosis Date   ??? Bone pain    ??? COPD (chronic obstructive pulmonary disease) ([place])    ??? Hyperlipidemia    ??? Leukemia, plasma cell, in relapse ([place])    ??? Low back pain    ??? Multiple myeloma ([place])    ??? Neuropathy     chemo induced, feet       Past Surgical History:  Procedure Laterality Date   ??? BONE MARROW BIOPSY     ??? BONE MARROW TRANSPLANT     ??? BRONCHOSCOPY N/A 02/03/2018    BRONCHOSCOPY WITH BAL, CHOICE ANESTHESIA performed by David Route, MD at [facility]   ??? BRONCHOSCOPY  02/03/2018    BRONCHOSCOPY THERAPUTIC ASPIRATION SUBSEQUENT performed by David Route, MD at [facility]   ??? ENDOSCOPY, COLON, DIAGNOSTIC     ??? OTHER SURGICAL HISTORY Left 08/17/2016    trifusion cath placement   ??? PRE-MALIGNANT / BENIGN SKIN LESION EXCISION Left 03/29/2017    EXCISE LESION LEFT EXTERNAL EAR WITH FROZEN SECTION, FULL THICKNESS SKIN GRAFT   ??? SKIN BIOPSY      left ear       Current Medications:    Current Facility-Administered Medications: dextrose 50 % solution, , ,   amiodarone (CORDARONE) tablet 200  mg, 200 mg, Oral, Daily  atorvastatin (LIPITOR) tablet 10 mg, 10 mg, Oral, Nightly  bisacodyl (DULCOLAX) EC tablet 10 mg, 10 mg, Oral, Daily PRN  calcium elemental (OSCAL) tablet 500 mg, 500 mg, Oral, BID  diltiazem (CARDIZEM CD) extended release capsule 240 mg, 240 mg, Oral, Daily  fentaNYL (DURAGESIC) 50 MCG/HR 1 patch, 1 patch, Transdermal, Q72H  gabapentin (NEURONTIN) capsule 300 mg, 300 mg, Oral, TID  Isavuconazonium Sulfate CAPS 372 mg, 372 mg, Oral, Daily  lidocaine-prilocaine (EMLA) cream, , Topical, PRN  ondansetron (ZOFRAN) tablet 4 mg, 4 mg, Oral, Q6H PRN  oxyCODONE-acetaminophen (PERCOCET) 7.5-325 MG per tablet 1 tablet, 1 tablet, Oral, Q4H PRN  pantoprazole (PROTONIX) tablet 40 mg, 40 mg, Oral, QAM AC  prochlorperazine (COMPAZINE) tablet 10 mg, 10 mg, Oral, Q6H PRN  sennosides-docusate sodium (SENOKOT-S) 8.6-50 MG tablet 2 tablet, 2 tablet, Oral, Daily  sertraline (ZOLOFT) tablet 100 mg, 100 mg, Oral, Daily  tiotropium (SPIRIVA) inhalation capsule 18 mcg, 18 mcg, Inhalation, Daily  valACYclovir (VALTREX) tablet 500 mg, 500 mg, Oral, BID  sodium chloride flush 0.9 % injection 10 mL, 10 mL, Intravenous, 2 times per day  sodium chloride flush 0.9 % injection 10 mL, 10 mL, Intravenous, PRN  potassium chloride 20 mEq/50 mL IVPB (Central Line), 20 mEq, Intravenous, PRN  magnesium sulfate 4 g in 100 mL IVPB premix, 4 g, Intravenous, PRN  magnesium hydroxide (MILK OF MAGNESIA) 400 MG/5ML suspension 30 mL, 30 mL, Oral, Daily PRN  Saline Mouthwash 15 mL, 15 mL, Swish & Spit, 4x Daily AC & HS  Saline Mouthwash 15 mL, 15 mL, Swish & Spit, Q4H PRN  alteplase (CATHFLO) injection 2 mg, 2 mg, Intracatheter, PRN  enoxaparin (LOVENOX) injection 40 mg, 40 mg, Subcutaneous, Daily  albuterol (PROVENTIL) (2.5 MG/3ML) 0.083% nebulizer solution, , ,   albuterol (PROVENTIL) nebulizer solution 2.5 mg, 2.5 mg, Nebulization, 4x daily  budesonide (PULMICORT) nebulizer suspension 500 mcg, 0.5 mg, Nebulization, BID **AND** formoterol  (PERFOROMIST) nebulizer solution 20 mcg, 20 mcg, Nebulization, BID  piperacillin-tazobactam (ZOSYN) 3.375 g in dextrose 5 % 100 mL IVPB (mini-bag), 3.375 g, Intravenous, Q6H  vancomycin (VANCOCIN) 1,250 mg in dextrose 5 % 250 mL IVPB, 1,250 mg, Intravenous, Q12H  0.9 % sodium chloride bolus, 250 mL, Intravenous, Once  sodium chloride 0.9 % infusion, , ,   0.9 % sodium chloride infusion, , Intravenous, Continuous    Allergies:  Lenalidomide; Pomalidomide; and Ceclor [cefaclor]    Social History:    MARITAL STATUS:  domestic partnership   Patient currently lives with family - significant other    Review of Systems -   Unable  to obtain due to mental status    Objective:        PHYSICAL EXAM:    General appearance: appears stated age and no distress  Lungs: diminished breath sounds bilaterally  Heart: regular rate and rhythm  Abdomen: soft, non-tender; bowel sounds normal; no masses,  no organomegaly  Extremities: extremities normal, atraumatic, no cyanosis or edema  Skin: Skin color, texture, turgor normal. No rashes or lesions  Neurologic: lethargic, difficult to arouse    Palliative Performance Scale:  60% ? Ambulation reduced; Significant disease; Can't do hobbies/housework; intake normal   or reduced; occasional assist; LOC full/confusion  50% ? Mainly sit/lie; Extensive disease; Can't do any work; Considerable assist; intake normal  Or reduced; LOC full/confusion  40% ? Mainly in bed; Extensive disease; Mainly assist; intake normal or reduced; occasional assist; LOC full/confusion  30% ? Bed Bound; Extensive disease; Total care; intake reduced; LOC full/confusion  20% ? Bed Bound; Extensive disease; Total care; intake minimal; Drowsy/coma  10% ? Bed Bound; Extensive disease; Total care; Mouth care only; Drowsy/coma  0 ? Death    PPS: 40%    Vitals:    BP 114/67    Pulse 92    Temp 97.4 ??F (36.3 ??C) (Oral)    Resp 18    Ht 5' 7.99" (1.727 m)    Wt 179 lb 14.3 oz (81.6 kg)    SpO2 99%    BMI 27.36 kg/m??     DATA:     CBC with Differential:    Lab Results   Component Value Date    WBC 7.2 04/18/2018    RBC 2.08 04/18/2018    RBC 3.30 04/24/2016    HGB 7.0 04/18/2018    HCT 21.0 04/18/2018    PLT 66 04/18/2018    MCV 100.6 04/18/2018    MCH 33.7 04/18/2018    MCHC 33.5 04/18/2018    RDW 25.1 04/18/2018    NRBC 4 04/24/2017    BANDSPCT 6 04/18/2018    BLASTSPCT 2 04/18/2018    METASPCT 5 04/18/2018    LYMPHOPCT 15.0 04/18/2018    LYMPHOPCT 18.7 04/24/2016    PROMYELOPCT 1 04/27/2017    MONOPCT 11.0 04/18/2018    MYELOPCT 1 04/18/2018    BASOPCT 0.0 04/18/2018    MONOSABS 0.8 04/18/2018    LYMPHSABS 1.1 04/18/2018    EOSABS 0.0 04/18/2018    BASOSABS 0.0 04/18/2018     BMP:    Lab Results   Component Value Date    NA 135 04/18/2018    K 4.1 04/18/2018    CL 99 04/18/2018    CO2 25 04/18/2018    BUN 18 04/18/2018    LABALBU 2.7 04/18/2018    CREATININE 0.9 04/18/2018    CALCIUM 9.0 04/18/2018    GFRAA >60 04/18/2018    LABGLOM >60 04/18/2018    GLUCOSE 109 04/18/2018    GLUCOSE 110 04/24/2016     Albumin:    Lab Results   Component Value Date    LABALBU 2.7 04/18/2018         Conclusion/Time spent:         Recommendations see above    [id number]  Time spent with patient and/or family: 25  Time reviewing records: 10  Time communicating with staff: 10    A total of 45 minutes spent with the patient and family on unit greater than 50% in counseling regarding palliative care and in goals of care for the patient.  Thank you to Dr. Hunt Oris for this consultation. We will continue to follow Mr. Steinhart care as needed.      Janetta Hora, NP  [place]

## 2018-04-18 NOTE — Progress Notes (Signed)
VSS. Reassessment completed. Scheduled meds given at this time. Pt resting comfortably in bed without any signs of distress at this time. Respirations are easy and unlabored. Call light within reach. Bed alarm activated. Will continue to monitor. Electronically signed by Regis Bill, RN on 04/18/2018 at 12:24 PM

## 2018-04-18 NOTE — H&P (Signed)
BCC History and Physical       Attending Physician: Harlene Salts, MD    Primary Care: Harlene Salts, MD       Referring MD: No referring provider defined for this encounter.    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 His PMH is also significant for HTN, HLD, anxiety/depression, COPD, peripheral neuropathy, PAF w/RVR, & pericarditis.     Pt??is followed closely by??Dr. Dorann Lodge for COPD and recently completed a steroid taper for COPD exacerbation. He was doing well and regaining strength and function until the past couple of days. He now presents (04/18/18)??w/chest pain and shortness of breath. CXR & clinical exam was found to be c/w pneumonia therefore blood cultures were obtained and he was started on empiric IV abx in the ED (zosyn & IV vancomycin initiated 8/19). Cardiac w/u for possible contributors to SOB were negative (troponin & EKG). He was then admitted for further monitoring and treatment. Of note, he does cont to have +fungitel per outpt records continuing on tx w/cresemba.    In addition to acute hypoxic respiratory failure 2/2 RML & RLL PNA, review of his outpt records reveals likely relapsing myeloma s/p multiple lines of therapy. Most recent M-spike from 8/2 is now up to 3.0gm/dL, up from 0.5gm/dL on 6/21 while receiving active tx. Dr. Hunt Oris has recently repeated these labs (8/16) with plans for alternative regimen vs hospice if they cont to rise. I suspect his myeloma is most definitely actively relapsing given his current condition. Review of admission labs also reveals elevated serum protein c/w relapsing myeloma.     On assessment today he  is profoundly weak and visibly SOB & tachypneic. He states he thinks he's dying and just wants to go home if that's the case. He is requiring 4lpm of supplemental O2. Pulmonology has been consulted and are following closely. For today, we will proceed with IV abx, CT scans, and await results of serum myeloma studies to confirm our suspicions. If he is in fact relapsing, we will discuss with pt and if he wishes plan for home hospice as early as tomorrow.      Past Surgical History:   Procedure Laterality Date   ??? BONE MARROW BIOPSY     ??? BONE MARROW TRANSPLANT     ??? BRONCHOSCOPY N/A 02/03/2018    BRONCHOSCOPY WITH BAL, CHOICE ANESTHESIA performed by Neville Route, MD at [place]   ??? BRONCHOSCOPY  02/03/2018    BRONCHOSCOPY THERAPUTIC ASPIRATION SUBSEQUENT performed by Neville Route, MD at [place]   ??? ENDOSCOPY, COLON, DIAGNOSTIC     ??? OTHER SURGICAL HISTORY Left 08/17/2016    trifusion cath placement   ??? PRE-MALIGNANT / BENIGN SKIN LESION EXCISION Left 03/29/2017    EXCISE LESION LEFT EXTERNAL EAR WITH FROZEN SECTION, FULL THICKNESS SKIN GRAFT   ??? SKIN BIOPSY      left ear       Past Medical History:   Diagnosis Date   ??? Bone pain    ??? COPD (chronic obstructive pulmonary disease) ([place])    ??? Hyperlipidemia    ???  Leukemia, plasma cell, in relapse ([place])    ??? Low back pain    ??? Multiple myeloma ([place])    ??? Neuropathy     chemo induced, feet       Prior to Admission medications    Medication Sig Start Date End Date Taking? Authorizing Provider   fentaNYL (DURAGESIC) 50 MCG/HR Place 1 patch onto the skin every 72 hours.   Yes Historical Provider, MD   Fluticasone Furoate-Vilanterol (BREO ELLIPTA) 200-25 MCG/INH AEPB Inhale into the lungs daily   Yes Historical Provider, MD   pomalidomide (POMALYST) 2 MG chemo capsule Take 2 mg by mouth daily   Yes Historical Provider, MD   gabapentin (NEURONTIN) 300 MG capsule Take 300 mg by mouth 3 times daily. Indications: 1 TAB AM 1 TAB AFTERNOON AND 2 AT PM   Yes Historical  Provider, MD   tiotropium (SPIRIVA HANDIHALER) 18 MCG inhalation capsule Inhale 1 capsule into the lungs daily 02/21/18 02/21/19 Yes Charleen Kirks, MD   amiodarone (CORDARONE) 200 MG tablet Take 1 tablet by mouth daily 02/08/18  Yes Verne Grain., DO   phosphorus (K PHOS NEUTRAL) [phone] MG tablet Take 2 tablets by mouth 2 times daily  Patient taking differently: Take 1 tablet by mouth 2 times daily  02/08/18  Yes Verne Grain., DO   Isavuconazonium Sulfate 186 MG CAPS Take 372 mg by mouth daily Take 2 caps 3 times daily x 6 doses, then take daily 02/08/18  Yes Verne Grain., DO   calcium carbonate (OSCAL) 500 MG TABS tablet Take 500 mg by mouth 2 times daily    Yes Historical Provider, MD   oxyCODONE-acetaminophen (PERCOCET) 7.5-325 MG per tablet Take 1 tablet by mouth every 4 hours as needed for Pain..   Yes Historical Provider, MD   sertraline (ZOLOFT) 50 MG tablet Take 100 mg by mouth daily    Yes Historical Provider, MD   pantoprazole (PROTONIX) 40 MG tablet Take 1 tablet by mouth every morning (before breakfast) 05/01/17  Yes Jannette Spanner, MD   atorvastatin (LIPITOR) 10 MG tablet Take 10 mg by mouth nightly    Yes Historical Provider, MD   valACYclovir (VALTREX) 500 MG tablet Take 500 mg by mouth 2 times daily   Yes Historical Provider, MD   benzocaine-menthol (CEPACOL SORE THROAT) 15-3.6 MG lozenge Take 1 lozenge by mouth every 2 hours as needed for Sore Throat 02/28/18   Jannette Spanner, MD   diltiazem (CARDIZEM CD) 240 MG extended release capsule Take 1 capsule by mouth daily  Patient not taking: Reported on 03/28/2018 02/08/18   Verne Grain., DO   bisacodyl (DULCOLAX) 5 MG EC tablet Take 2 tablets by mouth daily as needed for Constipation 01/18/18   Harlene Salts, MD   sennosides-docusate sodium (SENOKOT-S) 8.6-50 MG tablet Take 2 tablets by mouth daily  Patient not taking: Reported on 03/28/2018 01/18/18   Harlene Salts, MD   lidocaine-prilocaine (EMLA) 2.5-2.5 % cream  Apply topically as needed for Pain Apply topically as needed.    Historical Provider, MD   albuterol sulfate HFA (PROAIR HFA) 108 (90 Base) MCG/ACT inhaler Inhale 2 puffs into the lungs every 6 hours as needed for Wheezing    Historical Provider, MD   ondansetron (ZOFRAN) 4 MG tablet Take 1 tablet by mouth every 6 hours as needed for Nausea or Vomiting 05/29/17   Marianne Sofia, MD   prochlorperazine (COMPAZINE) 10 MG tablet Take  1 tablet by mouth every 6 hours as needed (Nausea) 05/01/17   Jannette Spanner, MD       Allergies   Allergen Reactions   ??? Lenalidomide Rash   ??? Pomalidomide Hives   ??? Ceclor [Cefaclor] Hives       Family History   Problem Relation Age of Onset   ??? Heart Disease Mother    ??? Lung Cancer Father    ??? Heart Attack Brother         Social History     Socioeconomic History   ??? Marital status: Life Partner     Spouse name: [name]   ??? Number of children: Not on file   ??? Years of education: Not on file   ??? Highest education level: Not on file   Occupational History   ??? Not on file   Social Needs   ??? Financial resource strain: Not on file   ??? Food insecurity:     Worry: Not on file     Inability: Not on file   ??? Transportation needs:     Medical: Not on file     Non-medical: Not on file   Tobacco Use   ??? Smoking status: Former Smoker     Types: Cigarettes     Last attempt to quit: 11/25/2012     Years since quitting: 5.3   ??? Smokeless tobacco: Never Used   Substance and Sexual Activity   ??? Alcohol use: Yes     Comment: socially   ??? Drug use: No   ??? Sexual activity: Yes   Lifestyle   ??? Physical activity:     Days per week: Not on file     Minutes per session: Not on file   ??? Stress: Not on file   Relationships   ??? Social connections:     Talks on phone: Not on file     Gets together: Not on file     Attends religious service: Not on file     Active member of club or organization: Not on file     Attends meetings of clubs or organizations: Not on file     Relationship status: Not on file   ???  Intimate partner violence:     Fear of current or ex partner: Not on file     Emotionally abused: Not on file     Physically abused: Not on file     Forced sexual activity: Not on file   Other Topics Concern   ??? Not on file   Social History Narrative   ??? Not on file        ROS:  As noted above, otherwise remainder of 10-point ROS negative    Physical Exam:     Vital Signs:  BP (!) 90/52 Comment: MAP 66   Pulse 84    Temp 98.2 ??F (36.8 ??C) (Oral)    Resp 18    Ht 5' 7.99" (1.727 m)    Wt 179 lb 14.3 oz (81.6 kg)    SpO2 98%    BMI 27.36 kg/m??     Weight:    Wt Readings from Last 3 Encounters:   04/18/18 179 lb 14.3 oz (81.6 kg)   03/28/18 178 lb (80.7 kg)   03/07/18 188 lb (85.3 kg)         General: Awake, alert and oriented.  HEENT: normocephalic, PERRL, no scleral erythema or icterus, Oral mucosa moist and intact, throat clear  NECK:  supple without palpable adenopathy  BACK: Straight negative CVAT  SKIN: warm dry and intact without lesions rashes or masses  CHEST: CTA bilaterally without use of accessory muscles  CV: Normal S1 S2, RRR, no MRG  ABD: NT ND normoactive BS, no palpable masses or hepatosplenomegaly  EXTREMITIES: without edema, denies calf tenderness  NEURO: CN II - XII grossly intact  CATHETER: [facility]    Laboratory Data:   CBC:   Recent Labs     04/18/18  0149   WBC 7.2   HGB 7.0*   HCT 21.0*   MCV 100.6*   PLT 66*     BMP/Mag:  Recent Labs     04/18/18  0149   NA 135*   K 4.1   CL 99   CO2 25   BUN 18   CREATININE 0.9     LIVP:   Recent Labs     04/18/18  0149   AST 43*   ALT 14   BILITOT 0.4   ALKPHOS 86     Coags: No results for input(s): PROTIME, INR, APTT in the last 72 hours.  Uric Acid No results for input(s): LABURIC in the last 72 hours.     PROBLEM LIST: ??????????   ????  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 ??Anxiety/Depression  4.????Hyperlipidemia  5.????Hypertension  6.????Insomnia  7.????Chronic low??back pain??d/t neoplasm (h/o lytic  lesions??&??cord compression @??T6 &??T11)  8. ??COPD  9.????Influenza A??(10/12/17)  10. H/o Acute pericarditis  11. PAF w/RVR  ????  TREATMENT: ??????????   ??  1. Rad Tx to T5-7, T10-L3, Right Scapula - 3000 cGy - Dr. Jamas Lav 03/20/16-04/01/16  2. RVD x1 03/20/16 - discontinued d/t rash   3. Velcade/Pomalyst/Dex x3 cycles 04/23/16-06/17/16 - discontinued d/t reaction to pomalyst  4. Velcade/Dex x 1 cycle 06/26/16 (last dose of dexamethasone 07/22/16  5. High-dose melphalan followed by administration of PBSCs 2.36 x10^6 cd34cells/kg on 08/28/16  6. Maintenance Revlimid 68m daily (12/2016-04/23/17)  7. Dexamethasone 466mdaily (04/24/17)  8. DCEP   Cycle #1 - 04/26/17  Cycle #2 - 05/25/17 - excellent response  9.??Dara/Velcade/Dex (C1D1 - 06/22/17) - BMBx ??10/18/17 - CR  Cycles 7-8 (21 day cycle)  - Dex 20 mg po D4,5.8,9,11 and 12  - Dara 16 mg/Kg D1 only with Dex 20 mg IV  - Velcade 1.3 mg/M2 D1,4,8 and 11  Cycle 9 and beyond (maintenance, 11/08/17??- 01/03/18)  Dara 16 mg/Kg Q 28 days  Dex 12 mg IV D1  Velcade Q 2 weeks??  10. DCEP 01/13/18  11. Elo/Pom/Dex - C1D1 02/18/18  ??  ASSESSMENT AND PLAN:??????????   ??  1. IgG kappa multiple myeloma / Plasma Cell Leukemia:??Relapsed disease   - S/p treatment Dara/Velcade/Dex x 8 cycles (06/22/17 - 10/2017). Followed by maintenance Dara Q4 wks/Velcade Q2ks/Dex (started 11/08/17)  - Progressive disease based on myeloma labs (01/03/18) &??PET scan (01/10/18), see above   - S/p 1 cycle DCEP     PLAN: Elo/Pom 2 mg/Dex (C1D1 02/18/18). Hope for allogeneic transplant in CR2, but needs to disease response and improvement in lung function   - M-spike (01/27/18) - 0.5 g/dL, previously 1.0 g/dL  - MMP Serum:   02/18/18: M-spike 0.5gm/dL; SFLC ratio 4.95   04/01/18: M-spike 3.0 gm/dL; SFLC ratio 27.81   04/15/2018: pending. If continue to worsen will consider Hospice     Elo/Pom/Dex - on hold for now. Increasing protein on serum panel and increasing M-spike on most recent  labs indicative of actively relapsing disease. Will wait for  labs recently sent 04/15/18 and review options with pt including hospice.  Cycle 2 Day 25      2. ID:??Afebrile, presented with sepsis 2/2 RML & RLL PNA. H/o fungal PNA  - Blood Cxs 8/19 - pending  - MRSA Nasal Swab 8/19 - pending  - Procalcitonin 0.65 8/19  - Histo UrAg, Strep Pneumo UrAg, Legionella UrAg, respirtory film panel pending 8/19    - Continue Valtrex ppx  - Zosyn Day +1 (04/18/18)  - Vancomycin Day +1 (04/18/18)  - Sepsis: lactate 3.6; received 87m/kg fluid bolus. BP soft now, not requiring pressors at this time    - H/o Fungal PNA: Fungitell and galactomannan (01/25/18) -??Positive; Bronch / Diatherix (02/03/18)  - Cont Cresemba for fungal PNA. Cont to monitor fungitel weekly:              - 5/28 - 403              - 6/28 -  201              - 7/5 - 81              - 8/2 - 381              - 8/9 - 335              - 8/19 - pending    3. Heme:??Chemotherapy induced??anemia and thrombocytopenia??  - Transfuse for Hgb <??8 (severe COPD) and Platelets <??10K  - PRBC transfusion today    4. Metabolic: HypoNa, otherwise electrolytes & renal fxn stable  - Cont KPhos 1 tablet BID (started 02/04/18)  - Keep Mg > 2 and K+ > 4.0??  - Cont NS at 772mhr    5. Pulmonary: H/o severe COPD, Acute hypoxic respiratory failure d/t RML & RLL PNA  - S/p Steroid taper:??5/25-6/30/19  - COPD: Cont??Spiriva and Breo. Cont albuterol PRN  - Cont ID tx as above  - Consult Pulmonology - pending  - CT Chest 8/19 - pending    6. GI / Nutrition: Appetite and oral intake is fair  - Continue low microbial diet     7. Cardiac: H/o PAF w/RVR, Acute pericarditis, HLD, & evidence of septal wall defect on echocardiogram  - Echo (01/23/18): LVEF 50-55% w/ normal diastolic fxn &??no obvious regional wall motion abnormalities  - Continue Lipitor   - Continue Diltiazem CD 240 mg daily & Amiodarone 200 mg daily.  ??  8. Psych, Anxiety & Depression: Acutely exacerbated with hypoxia & SOB  - Continue Zoloft 100 mg daily   - Psych to follow as needed    9. Peripheral  Neuropathy:??Ongoing  - Continue Gabapentin 300 mg TID    10. Bone Health: No acute fx   - H/o spinal cord compression &??diffuse lytic lesions t/o skeleton  - CT Chest (04/23/17) - multiple lytic lesions throughout the skeleton   - Continue Ca/Vit D &??Zometa monthly (given 02/10/18)     11. Acute on Chronic pain d/t Neoplasm:  - Continue Fentanyl patch 50 mcg/hr  - Continue Percocet 7.5/325 mg q4hrs prn    12. Code Status: Full code at this time  - Palliative care consulted on admission  - After discussing current situation with pts significant other, we will go ahead and consult hospice who will meet with them this afternoon. He will discuss his options with Dr. EsHunt Orisomorrow morning.      -  DVT Prophylaxis: Platelets >50,000 cells/dL, - daily lovenox prophylaxis ordered  Contraindications to pharmacologic prophylaxis: None  Contraindications to mechanical prophylaxis: None      - Disposition: Uncertain at this time. Possibly home with hospice tomorrow    The patient was seen and examined by Dr. Corky Sox. This admission history and physical has been discussed and agreed upon by Dr. Corky Sox.    Loma Newton, APRN - CNP       Discussed with patient and significant other  They have both indicated, in past hospitalizations, he has had advance directives of DNR/DNI    They understand that he has refractory myeloma - and entering the early stages of dying.  Code status updated, to reflect these desires/decisions      Hospice to see patient tomorrow at [place]. Drucilla Schmidt, DO, MS  Oncology/Hematology Care    Please contact via:  1.  [employer]  2.  Cell Phone:  [phone]    04/18/2018   5:38 PM

## 2018-04-18 NOTE — ED Notes (Addendum)
Patient presents to ED with complaints of shortness of breath that began yesterday and worsened today. He states he received his last treatment Friday. Patient denies coughing, but complains of rib pain with deep inspiration.           Elias Else, RN  04/18/18 [phone]

## 2018-04-18 NOTE — Progress Notes (Signed)
4 Eyes Admission Assessment     I agree as the admission nurse that 2 RN's have performed a thorough Head to Toe Skin Assessment on the patient. ALL assessment sites listed below have been assessed on admission.       Areas assessed by both nurses: Meagan Enneking/Maximino Cozzolino  [x]    Head, Face, and Ears   [x]    Shoulders, Back, and Chest  [x]    Arms, Elbows, and Hands   [x]    Coccyx, Sacrum, and Ischum  [x]    Legs, Feet, and Heels        Does the Patient have Skin Breakdown?  No         Braden Prevention initiated:  YES  Wound Care Orders initiated:  NO      WOC nurse consulted for Pressure Injury (Stage 3,4, Unstageable, DTI, NWPT, and Complex wounds): NO    Nurse 1 eSignature: Electronically signed by Lewie Chamber, RN on 04/18/18 at 5:18 AM    **SHARE this note so that the co-signing nurse is able to place an eSignature**    Nurse 2 eSignature: Electronically signed by Cheryll Dessert, RN on 04/18/18 at 5:23 AM

## 2018-04-18 NOTE — Progress Notes (Signed)
Patient admitted to [facility] via stretcher from ED for diagnosis of MM/SOB/PNA.    Patient and significant other oriented to patient room including call light and bed controls.  Admission assessment completed - see admission flowsheet documentation.  Patient is a high fall risk.  Safety measures instituted per policy    Patient and significant other oriented to unit policies and procedures including: pain management practices, unit safety precautions, family rapid response, q4h vital signs and assessments, daily 4am lab draws, weekly chest x-rays, weekly VRE rectal swabs for surveillance, daily chlorhexidine bathing, standing transfusion orders, and routine central line care.  Also discussed use of call light and how to get in touch with nursing staff.  Stressed the importance of calling out immediately for any changes in condition including but not limited to: pain, chills, fever, nausea, vomiting, diarrhea, chest pain, sob/doe, assistance with toileting, bleeding, or any other symptoms that are out of the ordinary for the patient.  Patient verbalizes understanding of all instructions and will call for assistance as needed.

## 2018-04-18 NOTE — Consults (Signed)
Clinical Pharmacy Progress Note  ??  Admit date: 04/18/18  ??  Subjective/Objective:  Pt presented to ED with shortness of breath. CXR suggestive of pneumonia. Labs pending. Creatinine from UC a few months ago was fine and has historically been good.  Pharmacy is consulted to dose Vancomycin x1 dose in ED per Dr. Hedda Slade.  ??  Wt = 80.7 kg  ??  ??  Assessment/Plan:  ?? Will order Vancomycin 1500 mg IV x1 for administration in ED per ER pharmacy consult.   ?? If Vancomycin is to continue on admission and pharmacy is to manage dosing, please re-consult with admission orders.  ??  ??  Thanks--  Hayes Ludwig, RPh 04/18/2018, 1:35 AM

## 2018-04-18 NOTE — Plan of Care (Signed)
Problem: Falls - Risk of:  Goal: Will remain free from falls  Description  Will remain free from falls  Outcome: Ongoing  Note:   Pt meets criteria for orthostasis.  Pt is a High fall risk. See Leamon Arnt Fall Score and ABCDS Injury Risk assessments. Explained fall risk precautions to pt and family and rationale behind their use to keep the patient safe. Pt bed is in low position, side rails up, call light and belongings are in reach. Fall wristband applied and present on pts wrist.  Bed alarm on.  Pt encouraged to call for assistance. Will continue with hourly rounds for PO intake, pain needs, toileting and repositioning as needed.        Problem: Pain:  Goal: Pain level will decrease  Description  Pain level will decrease  Outcome: Ongoing  Note:   Pt educated on importance of calling for pain meds when in pain. Pt verbalized understanding. Pain assessment completed at least once per shift. Will continue to monitor.        Problem: Risk for Impaired Skin Integrity  Goal: Tissue integrity - skin and mucous membranes  Description  Structural intactness and normal physiological function of skin and  mucous membranes.  Outcome: Ongoing  Note:   Pt to remain free of skin breakdown during stay, pt encouraged to turn and reposition frequently. No evidence of skin breakdown noted.

## 2018-04-18 NOTE — Progress Notes (Signed)
CC:  Dyspnea    Readmitted to the hospital yesterday because of increasing complaints of shortness of breath over the prior few days.  More discomfort in his left chest, and increased cough.  No observed fevers or chills.  He states he feels he is not going to recover from this and he is frightened.  No fever recorded here.  WBC 7.2  Procalcitonin 0.65  On 04/01/2018, free kappa light chains measured at 260 mg/L, compared to 25.5 on 02/18/2018, and free kappa lambda ratio is 27.8, compared to 4.95  Empiric antibiotic therapy started with pipercillin-tazobactam and vancomycin.  BP 111/62.  NSR.  Heart sounds distant  O2 saturation 95 % on O2 at 4 l/min  Breath sounds diminished overall, no adventitious sounds.  Chest x-ray today shows persistent right upper lobe infiltrate, somewhat improved from June 24.  Increased small alveolar opacities bilaterally, especially right lower lung field  CT chest shows significant consolidation in the right upper lobe.  There is a loculated right pleural effusion, which is small.  Scattered nodular opacities are again seen, in comparison to prior chest CT in June.  Some of these opacities resolved but others have become more plump.  No cavitation.  Very small left pleural effusion is present.    A&P: Acute worsening of pulmonary disease is likely related to worsening lower respiratory infection.  The right upper lobe pneumonia never resolved, and this may be worse than he had been experiencing.  Likewise, multiple smaller infiltrates have become increased in size.  Accompanying this is evidence that multiple myeloma is progressive, despite a last ditch effort for treatment.  Empiric antibiotic therapy is reasonable, but may not have much impact.  He is ill, and his likelihood of surviving this in the short run is limited.  Clearly he will not survive his myeloma.  I would consider comfort care a reasonable option.

## 2018-04-18 NOTE — ED Notes (Signed)
Patient to xray.     Elias Else, RN  04/18/18 [phone]

## 2018-04-18 NOTE — Progress Notes (Signed)
Respiratory ARRAY Panel collected and sent to lab.

## 2018-04-18 NOTE — ED Notes (Signed)
Lab called to draw second set of cultures.      Garen Lah, RN  04/18/18 [phone]

## 2018-04-19 LAB — COMPREHENSIVE METABOLIC PANEL
ALT: 14 U/L (ref 10–40)
AST: 35 U/L (ref 15–37)
Albumin/Globulin Ratio: 0.3 — ABNORMAL LOW (ref 1.1–2.2)
Albumin: 2.3 g/dL — ABNORMAL LOW (ref 3.4–5.0)
Alkaline Phosphatase: 73 U/L (ref 40–129)
Anion Gap: 7 (ref 3–16)
BUN: 8 mg/dL (ref 7–20)
CO2: 25 mmol/L (ref 21–32)
Calcium: 8.1 mg/dL — ABNORMAL LOW (ref 8.3–10.6)
Chloride: 112 mmol/L — ABNORMAL HIGH (ref 99–110)
Creatinine: 0.8 mg/dL (ref 0.8–1.3)
GFR African American: >60 (ref >60)
GFR Non-African American: >60 (ref >60)
Globulin: 7.3 g/dL
Glucose: 108 mg/dL — ABNORMAL HIGH (ref 70–99)
Potassium: 3.8 mmol/L (ref 3.5–5.1)
Sodium: 144 mmol/L (ref 136–145)
Total Bilirubin: 0.3 mg/dL (ref 0.0–1.0)
Total Protein: 9.6 g/dL — ABNORMAL HIGH (ref 6.4–8.2)

## 2018-04-19 LAB — CBC WITH AUTO DIFFERENTIAL
Atypical Lymphocytes Relative: 7 % — ABNORMAL HIGH (ref 0–6)
Bands Relative: 20 % — ABNORMAL HIGH (ref 0–7)
Basophils %: 0 %
Basophils Absolute: 0 10*3/uL (ref 0.0–0.2)
Blasts Relative: 3 % — AB
Eosinophils %: 0 %
Eosinophils Absolute: 0 10*3/uL (ref 0.0–0.6)
Hematocrit: 21.1 % — ABNORMAL LOW (ref 40.5–52.5)
Hemoglobin: 7.1 g/dL — ABNORMAL LOW (ref 13.5–17.5)
Lymphocytes %: 15 %
Lymphocytes Absolute: 1.3 10*3/uL (ref 1.0–5.1)
MCH: 33.3 pg (ref 26.0–34.0)
MCHC: 33.6 g/dL (ref 31.0–36.0)
MCV: 99.2 fL (ref 80.0–100.0)
MPV: 7.9 fL (ref 5.0–10.5)
Monocytes %: 5 %
Monocytes Absolute: 0.3 10*3/uL (ref 0.0–1.3)
Myelocyte Percent: 2 % — AB
Neutrophils %: 48 %
Neutrophils Absolute: 4.1 10*3/uL (ref 1.7–7.7)
Platelets: 54 10*3/uL — ABNORMAL LOW (ref 135–450)
RBC: 2.13 M/uL — ABNORMAL LOW (ref 4.20–5.90)
RDW: 23.4 % — ABNORMAL HIGH (ref 12.4–15.4)
WBC: 5.9 10*3/uL (ref 4.0–11.0)
nRBC: 2 /100 WBC — AB
nRBC: 2 /100 WBC — AB

## 2018-04-19 LAB — CULTURE, URINE: Urine Culture, Routine: NO GROWTH

## 2018-04-19 LAB — HISTOPLASMA ANTIGEN, URINE
Histoplasma Ag Interp: NOT DETECTED
Histoplasma Antigen Urine: NOT DETECTED ng/mL

## 2018-04-19 LAB — PREPARE RBC (CROSSMATCH): Dispense Status Blood Bank: TRANSFUSED

## 2018-04-19 LAB — PROCALCITONIN: Procalcitonin: 0.51 ng/mL — ABNORMAL HIGH (ref 0.00–0.15)

## 2018-04-19 LAB — LEGIONELLA ANTIGEN, URINE: L. pneumophila Serogp 1 Ur Ag: NEGATIVE

## 2018-04-19 LAB — MRSA DNA PROBE, NASAL: MRSA SCREEN RT-PCR: NEGATIVE

## 2018-04-19 LAB — STREP PNEUMONIAE ANTIGEN: STREP PNEUMONIAE ANTIGEN, URINE: NEGATIVE

## 2018-04-19 MED ORDER — MORPHINE SULFATE (CONCENTRATE) 20 MG/ML PO SOLN
20 MG/ML | ORAL | 0 refills | Status: AC | PRN
Start: 2018-04-19 — End: 2018-05-03

## 2018-04-19 MED ORDER — CIPROFLOXACIN HCL 500 MG PO TABS
500 MG | ORAL_TABLET | Freq: Two times a day (BID) | ORAL | 0 refills | Status: AC
Start: 2018-04-19 — End: 2018-04-26

## 2018-04-19 MED ORDER — MORPHINE SULFATE 4 MG/ML IJ SOLN
4 MG/ML | Freq: Once | INTRAMUSCULAR | Status: AC
Start: 2018-04-19 — End: 2018-04-19
  Administered 2018-04-19: 19:00:00 4 mg via INTRAVENOUS

## 2018-04-19 MED ORDER — ONDANSETRON 4 MG PO TBDP
4 MG | ORAL_TABLET | Freq: Three times a day (TID) | ORAL | 0 refills | Status: AC | PRN
Start: 2018-04-19 — End: ?

## 2018-04-19 MED ORDER — SODIUM CHLORIDE 0.9 % IV BOLUS
0.9 % | Freq: Once | INTRAVENOUS | Status: DC
Start: 2018-04-19 — End: 2018-04-19
  Administered 2018-04-19: 10:00:00 250 mL via INTRAVENOUS

## 2018-04-19 MED FILL — MORPHINE SULFATE 4 MG/ML IJ SOLN: 4 mg/mL | INTRAMUSCULAR | Qty: 1

## 2018-04-19 MED FILL — VANCOMYCIN HCL 1 G IV SOLR: 1 g | INTRAVENOUS | Qty: 1250

## 2018-04-19 MED FILL — MORPHINE SULFATE 2 MG/ML IJ SOLN: 2 mg/mL | INTRAMUSCULAR | Qty: 2

## 2018-04-19 MED FILL — GABAPENTIN 300 MG PO CAPS: 300 mg | ORAL | Qty: 1

## 2018-04-19 MED FILL — ZOSYN 3.375 (3-0.375) G IV SOLR: 3.375 (3-0.375) g | INTRAVENOUS | Qty: 3.38

## 2018-04-19 MED FILL — SERTRALINE HCL 100 MG PO TABS: 100 mg | ORAL | Qty: 1

## 2018-04-19 MED FILL — SENNOSIDES-DOCUSATE SODIUM 8.6-50 MG PO TABS: 8.6-50 mg | ORAL | Qty: 2

## 2018-04-19 MED FILL — ALBUTEROL SULFATE (2.5 MG/3ML) 0.083% IN NEBU: RESPIRATORY_TRACT | Qty: 3

## 2018-04-19 MED FILL — PANTOPRAZOLE SODIUM 40 MG PO TBEC: 40 mg | ORAL | Qty: 1

## 2018-04-19 MED FILL — PERFOROMIST 20 MCG/2ML IN NEBU: 20 MCG/2ML | RESPIRATORY_TRACT | Qty: 2

## 2018-04-19 MED FILL — VALACYCLOVIR HCL 500 MG PO TABS: 500 mg | ORAL | Qty: 1

## 2018-04-19 MED FILL — SODIUM CHLORIDE 0.9 % IV SOLN: 0.9 % | INTRAVENOUS | Qty: 250

## 2018-04-19 MED FILL — CRESEMBA 186 MG PO CAPS: 186 mg | ORAL | Qty: 2

## 2018-04-19 MED FILL — OYSTER SHELL CALCIUM 500 MG PO TABS: 500 mg | ORAL | Qty: 1

## 2018-04-19 MED FILL — FENTANYL 50 MCG/HR TD PT72: 50 ug/h | TRANSDERMAL | Qty: 1

## 2018-04-19 MED FILL — BUDESONIDE 0.5 MG/2ML IN SUSP: 0.5 MG/2ML | RESPIRATORY_TRACT | Qty: 2

## 2018-04-19 MED FILL — AMIODARONE HCL 200 MG PO TABS: 200 mg | ORAL | Qty: 1

## 2018-04-19 MED FILL — DILTIAZEM HCL ER COATED BEADS 240 MG PO CP24: 240 mg | ORAL | Qty: 1

## 2018-04-19 MED FILL — LIPITOR 20 MG PO TABS: 20 mg | ORAL | Qty: 1

## 2018-04-19 MED FILL — SODIUM CHLORIDE 0.9 % IV SOLN: 0.9 % | INTRAVENOUS | Qty: 1000

## 2018-04-19 NOTE — Progress Notes (Signed)
Occupational/Physical Therapy    PT/OT ordered. Chart reviewed. Pt to discharge home under hospice care today at 1400. Acute PT/OT not indicated - will discontinue orders.     Lauren Schummer, OTR/L, [phone]

## 2018-04-19 NOTE — Progress Notes (Signed)
CC:  Pneumonia    Feeling "OK" re: chest pain and dyspnea.  Coughing productively.  Morphine helping reduce symptoms, but not requiring it very often.  Dyspnea scares him.  Afebrile  BP 94/60  NSR  S1 S2 diminished  O2 sat 94-96% on O2 at 4 lpm  Breath sounds mildly decreased.  Few scattered crackles.  No edema.    A&P:  Pneumonia, worsening as multiple myeloma progresses.  His disease has been unresponsive to all chemotherapies.  He is dying from his acute and chronic illness.  He understands this, asking how long he might live, does not have expectations of surviving very long.  He and his girlfriend will be meeting with hospice this AM.  Recommend hospice care, O2, morphine, d/c antibiotics and any other medication that does not improve comfort.    Discussed with Mliss Sax  Discussed with Dr Hunt Oris

## 2018-04-19 NOTE — Progress Notes (Signed)
[facility] Progress Note    04/19/2018     David Watkins    MRN: [id number]    DOB: [birth year]    Referring MD: No referring provider defined for this encounter.      SUBJECTIVE:  Pain controlled, extremely weak.    ECOG PS:  (3) Capable of limited self-care, confined to bed or chair > 50% of waking hours    Isolation: None    Medications    Scheduled Meds:  ??? sodium chloride  250 mL Intravenous Once   ??? amiodarone  200 mg Oral Daily   ??? atorvastatin  10 mg Oral Nightly   ??? calcium elemental  500 mg Oral BID   ??? diltiazem  240 mg Oral Daily   ??? fentaNYL  1 patch Transdermal Q72H   ??? gabapentin  300 mg Oral TID   ??? Isavuconazonium Sulfate  372 mg Oral Daily   ??? pantoprazole  40 mg Oral QAM AC   ??? sennosides-docusate sodium  2 tablet Oral Daily   ??? sertraline  100 mg Oral Daily   ??? tiotropium  18 mcg Inhalation Daily   ??? valACYclovir  500 mg Oral BID   ??? sodium chloride flush  10 mL Intravenous 2 times per day   ??? Saline Mouthwash  15 mL Swish & Spit 4x Daily AC & HS   ??? enoxaparin  40 mg Subcutaneous Daily   ??? albuterol  2.5 mg Nebulization 4x daily   ??? budesonide  0.5 mg Nebulization BID    And   ??? formoterol  20 mcg Nebulization BID   ??? piperacillin-tazobactam  3.375 g Intravenous Q6H   ??? vancomycin  1,250 mg Intravenous Q12H     Continuous Infusions:  ??? sodium chloride 75 mL/hr at 04/18/18 0827     PRN Meds:.bisacodyl, lidocaine-prilocaine, ondansetron, oxyCODONE-acetaminophen, prochlorperazine, sodium chloride flush, potassium chloride, magnesium sulfate, magnesium hydroxide, Saline Mouthwash, alteplase, morphine **OR** morphine    ROS:  As noted above, otherwise remainder of 10-point ROS negative    Physical Exam:     I&O:      Intake/Output Summary (Last 24 hours) at 04/19/2018 0647  Last data filed at 04/19/2018 0530  Gross per 24 hour   Intake 3991 ml   Output 4900 ml   Net -909 ml       Vital Signs:  BP 94/60    Pulse 81    Temp 97.7 ??F (36.5 ??C) (Axillary)    Resp 20    Ht 5' 7.99" (1.727 m)    Wt 179 lb 14.3  oz (81.6 kg)    SpO2 96%    BMI 27.36 kg/m??     Weight:    Wt Readings from Last 3 Encounters:   04/18/18 179 lb 14.3 oz (81.6 kg)   03/28/18 178 lb (80.7 kg)   03/07/18 188 lb (85.3 kg)         General: Awake, alert and oriented.  HEENT: normocephalic, PERRL, no scleral erythema or icterus, Oral mucosa moist and intact, throat clear  NECK: supple without palpable adenopathy  BACK: Straight negative CVAT  SKIN: warm dry and intact without lesions rashes or masses  CHEST: CTA bilaterally without use of accessory muscles  CV: Normal S1 S2, RRR, no MRG  ABD: NT ND normoactive BS, no palpable masses or hepatosplenomegaly  EXTREMITIES: without edema, denies calf tenderness  NEURO: CN II - XII grossly intact  CATHETER: [facility] - CDI    Data  CBC:   Recent Labs     04/18/18  0149 04/19/18  0352   WBC 7.2 5.9   HGB 7.0* 7.1*   HCT 21.0* 21.1*   MCV 100.6* 99.2   PLT 66* 54*     BMP/Mag:  Recent Labs     04/18/18  0149 04/19/18  0352   NA 135* 144   K 4.1 3.8   CL 99 112*   CO2 25 25   BUN 18 8   CREATININE 0.9 0.8     LIVP:   Recent Labs     04/18/18  0149 04/19/18  0352   AST 43* 35   ALT 14 14   BILITOT 0.4 0.3   ALKPHOS 86 73     Coags: No results for input(s): PROTIME, INR, APTT in the last 72 hours.  Uric Acid No results for input(s): LABURIC in the last 72 hours.  ??  PROBLEM LIST: ??????????   ????  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 ??COPD  9.????Influenza A??(10/12/17)  10. H/o Acute pericarditis  11. PAF w/RVR  ????  TREATMENT: ??????????   ??  1. Rad Tx to T5-7, T10-L3, Right Scapula - 3000 cGy - Dr. Jamas Lav 03/20/16-04/01/16  2. RVD x1 03/20/16 - discontinued d/t rash   3. Velcade/Pomalyst/Dex x3 cycles 04/23/16-06/17/16 - discontinued d/t reaction to pomalyst  4. Velcade/Dex x 1 cycle 06/26/16 (last dose of  dexamethasone 07/22/16  5. High-dose melphalan followed by administration of PBSCs 2.36 x10^6 cd34cells/kg on 08/28/16  6. Maintenance Revlimid 73m daily (12/2016-04/23/17)  7. Dexamethasone 450mdaily (04/24/17)  8. DCEP   Cycle #1 - 04/26/17  Cycle #2 - 05/25/17 - excellent response  9.??Dara/Velcade/Dex (C1D1 - 06/22/17) - BMBx ??10/18/17 - CR  Cycles 7-8 (21 day cycle)  - Dex 20 mg po D4,5.8,9,11 and 12  - Dara 16 mg/Kg D1 only with Dex 20 mg IV  - Velcade 1.3 mg/M2 D1,4,8 and 11  Cycle 9 and beyond (maintenance, 11/08/17??- 01/03/18)  Dara 16 mg/Kg Q 28 days  Dex 12 mg IV D1  Velcade Q 2 weeks??  10. DCEP 01/13/18  11. Elo/Pom/Dex - C1D1 02/18/18  ??  ASSESSMENT AND PLAN:??????????   ??  1. IgG kappa multiple myeloma / Plasma Cell Leukemia:??Relapsed disease   - S/p treatment Dara/Velcade/Dex x 8 cycles (06/22/17 - 10/2017). Followed by maintenance Dara Q4 wks/Velcade Q2ks/Dex (started 11/08/17)  - Progressive disease based on myeloma labs (01/03/18) &??PET scan (01/10/18), see above   - S/p 1 cycle DCEP     PLAN: Elo/Pom 2 mg/Dex (C1D1 02/18/18). Hope for allogeneic transplant in CR2, but needs to disease response and improvement in lung function   - M-spike (01/27/18) - 0.5 g/dL, previously 1.0 g/dL  - MMP Serum:              02/18/18: M-spike 0.5gm/dL; SFLC ratio 4.95              04/01/18: M-spike 3.0 gm/dL; SFLC ratio 27.81              04/15/2018: pending. If continue to worsen will consider Hospice     Elo/Pom/Dex - on hold for now. Increasing protein on serum panel and increasing M-spike on most recent labs indicative of actively relapsing disease. Will wait for labs recently sent 04/15/18 and review options with pt including hospice.  Cycle 2 Day 26    2. ID:??Afebrile, presented with sepsis 2/2 RML & RLL PNA, unknown type. H/o fungal PNA  - Blood Cxs 8/19 - NGTD  - MRSA Nasal Swab 8/19 - Negative  - Procalcitonin 0.65 8/19 - consider viral PNA  - Histo UrAg, Strep Pneumo UrAg, Legionella UrAg,   - Respirtory film panel neg 8/19  ??  -  Continue Valtrex ppx  - Zosyn Day +2 (04/18/18)  - Vancomycin Day +2 (04/18/18)  - Sepsis: lactate 3.6; received 41m/kg fluid bolus. BP soft now, not requiring pressors at this time    - H/o Fungal PNA: Fungitell and galactomannan (01/25/18) -??Positive; Bronch / Diatherix (02/03/18)  - Cont Cresemba for fungal PNA. Cont to monitor fungitel weekly:              - 5/28 - 403              - 6/28 -  201              - 7/5 - 81              - 8/2 - 381              - 8/9 - 335              - 8/19 - pending    3. Heme:??Chemotherapy induced??anemia and thrombocytopenia??  - Transfuse for Hgb <??8 (severe COPD) and Platelets <??10K  - No transfusion today    4. 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 Pulmonary:??H/o severe COPD, Acute hypoxic respiratory failure d/t RML & RLL PNA  -??S/p??Steroid taper:??5/25-6/30/19  - COPD: Cont??Spiriva and Breo. Cont albuterol PRN  - Cont ID tx as above  - Consult Pulmonology - appreciate assistance  - CT Chest 8/19 - bilateral pl effusions R>L, RUL consolidation, increased pulm metastasis & parenchymal nodules, metastatic LAD, mild splenomegaly    6. GI / Nutrition:   - Continue low microbial diet     7. Cardiac: H/o PAF w/RVR, Acute pericarditis, HLD, & evidence of septal wall defect on echocardiogram  - Echo (01/23/18): LVEF 50-55% w/ normal diastolic fxn &??no obvious regional wall motion abnormalities  - Continue Lipitor   - Continue Diltiazem CD 240 mg daily & Amiodarone 200 mg daily.  ??  8. Psych, Anxiety &??Depression: Acutely exacerbated with hypoxia & SOB  - Continue Zoloft 100 mg daily   - Psych to follow as needed    9. Peripheral Neuropathy:??Ongoing  - Continue Gabapentin 300 mg TID    10. Bone Health: No acute fx   - H/o spinal cord compression &??diffuse lytic lesions t/o skeleton  - CT Chest  (04/23/17) - multiple lytic lesions throughout the skeleton   - Continue Ca/Vit D &??Zometa monthly (given 02/10/18)     11. Acute on Chronic pain d/t Neoplasm:  - Continue Fentanyl patch 50 mcg/hr  - Continue Percocet 7.5/325 mg q4hrs prn  ??  12. Code Status: Changed to limited code, no to everything 04/18/18 afternoon  - Palliative care consulted on admission  - After discussing current situation with pts significant other, we will go ahead and consult hospice who will meet with them this afternoon. He will discuss his options with Dr. EsHunt Oris  tomorrow morning.  - Hospice meeting with pt today  ??    - DVT Prophylaxis: Prophylaxis on hold d/t contraindication  Contraindications to pharmacologic prophylaxis: Comfort care  Contraindications to mechanical prophylaxis: None    - Disposition: Home with hospice in the next 24 hrs      Loma Newton, APRN - CNP     Harlene Salts, MD  [facility]  Please contact me through [place]

## 2018-04-19 NOTE — Discharge Summary (Signed)
[facility] Discharge Summary             Attending Physician: Harlene Salts, MD    Referring MD: No referring provider defined for this encounter.    Name: David Watkins DOB:  1955/10/20  MRN:  [id number]    Admission: 04/17/2018   Discharge: 04/19/18     Date: 04/19/2018    Diagnosis on admit: Acute hypoxic respiratory failure 2/2 pneumonia & relapsed myeloma w/pulmonary involvement    Consultations:   1. Pulmonology: Dr. Dorann Lodge    Medications:    Donald Siva   Home Medication Instructions [phone]    Printed on:04/19/18 1337   Medication Information                      albuterol sulfate HFA (PROAIR HFA) 108 (90 Base) MCG/ACT inhaler  Inhale 2 puffs into the lungs every 6 hours as needed for Wheezing             amiodarone (CORDARONE) 200 MG tablet  Take 1 tablet by mouth daily             benzocaine-menthol (CEPACOL SORE THROAT) 15-3.6 MG lozenge  Take 1 lozenge by mouth every 2 hours as needed for Sore Throat             bisacodyl (DULCOLAX) 5 MG EC tablet  Take 2 tablets by mouth daily as needed for Constipation             ciprofloxacin (CIPRO) 500 MG tablet  Take 1 tablet by mouth 2 times daily for 7 days             fentaNYL (DURAGESIC) 50 MCG/HR  Place 1 patch onto the skin every 72 hours.             Fluticasone Furoate-Vilanterol (BREO ELLIPTA) 200-25 MCG/INH AEPB  Inhale into the lungs daily             gabapentin (NEURONTIN) 300 MG capsule  Take 300 mg by mouth 3 times daily. Indications: 1 TAB AM 1 TAB AFTERNOON AND 2 AT PM             lidocaine-prilocaine (EMLA) 2.5-2.5 % cream  Apply topically as needed for Pain Apply topically as needed.             morphine sulfate 20 MG/ML concentrated oral solution  Take 0.5 mLs by mouth every 2 hours as needed for Pain (Respiratory distress) for up to 14 days.             ondansetron (ZOFRAN ODT) 4 MG disintegrating tablet  Take 1 tablet by mouth every 8 hours as needed for Nausea or Vomiting             oxyCODONE-acetaminophen (PERCOCET) 7.5-325 MG per  tablet  Take 1 tablet by mouth every 4 hours as needed for Pain..             sennosides-docusate sodium (SENOKOT-S) 8.6-50 MG tablet  Take 2 tablets by mouth daily             sertraline (ZOLOFT) 50 MG tablet  Take 100 mg by mouth daily              tiotropium (SPIRIVA HANDIHALER) 18 MCG inhalation capsule  Inhale 1 capsule into the lungs daily                 Reason for Admission: Acute hypoxic respiratory failure 2/2 pneumonia & relapsed  myeloma w/pulmonary involvement    Past Medical History:  Lorren Splawn is a??62??yo pt well known to our service with relapsed/refractory IgG Kappa Multiple Myeloma / Plasma Cell Leukemia admitted to [facility] today with sepsis & pneumonia. His PMH is also significant for HTN, HLD, anxiety/depression, COPD, peripheral neuropathy, PAF w/RVR, & pericarditis.   ??  Pt??is followed closely by??Dr. Dorann Lodge for COPD and recently completed a steroid taper for COPD exacerbation. He was doing well and regaining strength and function until the past couple of days. He then presented (04/18/18)??w/chest pain and shortness of breath to [facility] ED. CXR & clinical exam was found to be c/w pneumonia therefore blood cultures were obtained and he was started on empiric IV abx in the ED (zosyn & IV vancomycin initiated 8/19). Cardiac w/u for possible contributors to SOB were negative (troponin & EKG). He was then admitted for further monitoring and treatment. Of note, he does cont to have +fungitel per outpt records continuing on tx w/cresemba.  ??  Hospital Course:   On admission pulmonology was consulted for mgmt of PNA & empiric IV abx were continued. He cont'd to be tachypneic & SOB requiring 4lpm of supplemental O2 to maintain oxygen saturation. CT scans were then completed 04/18/18 which revealed RUL consolidation & progression of pulmonary metastasis & LAD. In addition to acute hypoxic respiratory failure 2/2 RML & RLL PNA, review of his outpt records combined with CT findings & serum protein level reveals  progressive, relapsing myeloma s/p multiple lines of therapy. Most recent M-spike from 8/9 is now up to 3.0gm/dL, up from 0.5gm/dL on 6/21 while receiving active tx. Repeat light chain analysis (8/16) also reveals progressive disease.      On assessment today pt cont to be profoundly weak and visibly SOB & tachypneic. In light of CT findings and increasing light chains on labs, pt understands that he is succumbing to myeloma with little to no further options which could offer him increased quality of life. He is requesting to go home with hospice at this time, which we will certainly accommodate. [facility] will provide services for pt and he will be discharged home with his significant other later this afternoon. Comfort meds - roxanol, Zofran ODT, & lorazepam solution have been provided at d/c. Dr. Hunt Oris will remain attending physician on this case per his request.       Physical Exam:     Vital Signs:  BP 111/60    Pulse 92    Temp 98.1 ??F (36.7 ??C) (Axillary)    Resp 22    Ht 5' 7.99" (1.727 m)    Wt 179 lb 14.3 oz (81.6 kg)    SpO2 97%    BMI 27.36 kg/m??     Weight:    Wt Readings from Last 3 Encounters:   04/18/18 179 lb 14.3 oz (81.6 kg)   03/28/18 178 lb (80.7 kg)   03/07/18 188 lb (85.3 kg)       General: Awake, alert and oriented.  HEENT:??normocephalic, PERRL, no scleral erythema or icterus, Oral mucosa moist and intact, throat clear  NECK: supple without palpable adenopathy  BACK: Straight negative CVAT  SKIN:??warm dry and intact without lesions rashes or masses  CHEST:??CTA bilaterally without use of accessory muscles  CV: Normal S1 S2, RRR, no MRG  ABD:??NT ND normoactive BS, no palpable masses or hepatosplenomegaly  EXTREMITIES:??without edema, denies calf tenderness  NEURO: CN II - XII grossly intact  CATHETER:??[facility] - CDI    Discharge  Laboratory Data:  CBC:   Recent Labs     04/18/18  0149 04/19/18  0352   WBC 7.2 5.9   HGB 7.0* 7.1*   HCT 21.0* 21.1*   MCV 100.6* 99.2   PLT 66* 54*      BMP/Mag:  Recent Labs     04/18/18  0149 04/19/18  0352   NA 135* 144   K 4.1 3.8   CL 99 112*   CO2 25 25   BUN 18 8   CREATININE 0.9 0.8     LIVP:   Recent Labs     04/18/18  0149 04/19/18  0352   AST 43* 35   ALT 14 14   BILITOT 0.4 0.3   ALKPHOS 86 73       PROBLEM LIST: ??????????   ????  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 ??COPD  9.????Influenza A??(10/12/17)  10. H/o Acute pericarditis  11. PAF w/RVR  ????  TREATMENT: ??????????   ??  1. Rad Tx to T5-7, T10-L3, Right Scapula - 3000 cGy - Dr. Jamas Lav 03/20/16-04/01/16  2. RVD x1 03/20/16 - discontinued d/t rash   3. Velcade/Pomalyst/Dex x3 cycles 04/23/16-06/17/16 - discontinued d/t reaction to pomalyst  4. Velcade/Dex x 1 cycle 06/26/16 (last dose of dexamethasone 07/22/16  5. High-dose melphalan followed by administration of PBSCs 2.36 x10^6 cd34cells/kg on 08/28/16  6. Maintenance Revlimid 44m daily (12/2016-04/23/17)  7. Dexamethasone 483mdaily (04/24/17)  8. DCEP   Cycle #1 - 04/26/17  Cycle #2 - 05/25/17 - excellent response  9.??Dara/Velcade/Dex (C1D1 - 06/22/17) - BMBx ??10/18/17 - CR  Cycles 7-8 (21 day cycle)  - Dex 20 mg po D4,5.8,9,11 and 12  - Dara 16 mg/Kg D1 only with Dex 20 mg IV  - Velcade 1.3 mg/M2 D1,4,8 and 11  Cycle 9 and beyond (maintenance, 11/08/17??- 01/03/18)  Dara 16 mg/Kg Q 28 days  Dex 12 mg IV D1  Velcade Q 2 weeks??  10. DCEP 01/13/18  11. Elo/Pom/Dex - C1D1 02/18/18  ??  ASSESSMENT AND PLAN:??????????   ??  1. IgG kappa multiple myeloma / Plasma Cell Leukemia: Progressive disease   - S/p treatment Dara/Velcade/Dex x 8 cycles (06/22/17 - 10/2017). Followed by maintenance Dara Q4 wks/Velcade Q2ks/Dex (started 11/08/17)  - Progressive disease based on myeloma labs (01/03/18) &??PET scan (01/10/18), see above   - S/p 1 cycle DCEP   - S/p 2 cycles of  Elo/Pom/Dex    PLAN: Elo/Pom 2 mg/Dex (C1D1 02/18/18). Hope for allogeneic transplant in CR2, but needs to disease response and improvement in lung function   - M-spike (01/27/18) - 0.5 g/dL, previously 1.0 g/dL  - MMP??Serum:  ????????????????????????02/18/18: M-spike 0.5gm/dL;??SFLC ratio 4.95  ????????????????????????04/01/18: M-spike 3.0 gm/dL;??SFLC ratio 27.81  ????????????????????????04/15/2018: SFLC ratio 64    2. ID:??Afebrile, presented with sepsis 2/2 RML & RLL PNA, unknown type. H/o fungal PNA  - Blood Cxs 8/19 - NGTD  - MRSA Nasal Swab 8/19 - Negative  - Procalcitonin 0.65 8/19  - Histo UrAg, Strep Pneumo UrAg, Legionella UrAg, pending  - Respirtory film panel neg 8/19  - Cont empiric Cipro 50047mO BID (04/19/18)    -??H/o Fungal PNA:??Fungitell and galactomannan (01/25/18) -??Positive; Bronch / Diatherix (02/03/18)  - Cont Cresemba for fungal PNA. Cont to monitor fungitel weekly:  ????????????????????????- 8/19 - pending    3. Heme:??Chemotherapy  induced??anemia and thrombocytopenia??  - Transfuse for Hgb <??8 (severe COPD) and Platelets <??10K  - PRBC transfusion today    4. 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 Pulmonary:??H/o severe COPD,??Acute hypoxic respiratory failure d/t RML & RLL PNA  - COPD: Cont??Spiriva and Breo. Cont albuterol PRN  - Cont ID tx as above  - CT Chest 8/19 - bilateral pl effusions R>L, RUL consolidation, increased pulm metastasis & parenchymal nodules, metastatic LAD, mild splenomegaly    6. GI / Nutrition: Per pt wishes    7. Cardiac: H/o PAF w/RVR, Acute pericarditis, HLD, &??evidence of septal wall defect on echocardiogram  - Continue Amiodarone 200 mg daily.  ??  8. Anxiety /??Depression:??  - Continue Zoloft??100 mg daily   - Ativan PRN    9. Peripheral Neuropathy:??Ongoing  - Continue Gabapentin 300 mg TID    10. Acute on Chronic pain d/t Neoplasm:  - Continue Fentanyl patch 50 mcg/hr  - Continue Percocet 7.5/325 mg q4hrs prn  - Roxanol PRN  ??  11. Code Status:  DNR-CC  - Palliative care consulted on admission  - After discussing current situation with pts significant other, we will go ahead and consult hospice who will meet with them this afternoon. He will discuss his options with Dr. Hunt Oris tomorrow morning.  - Dc home with hospice today  ??  Condition on discharge: Poor. Home with hospice    Discharge Instructions:  No scheduled f/u. Dr. Hunt Oris will remain attending physician on pts case.      This discharge summary and plan was discussed and agreed upon with Dr. Hunt Oris.    Loma Newton, APRN - CNP

## 2018-04-19 NOTE — Plan of Care (Signed)
Problem: Falls - Risk of:  Goal: Will remain free from falls  Description  Will remain free from falls  04/19/2018 0454 by Donella Stade, RN  Outcome: Ongoing  Note:   Pt is a High fall risk. See Leamon Arnt Fall Score and ABCDS Injury Risk assessments.   + High Fall Risk per MORSE/ABCDS: Explained fall risk precautions to pt and family and rationale behind their use to keep the patient safe. Pt bed is in low position, side rails up, call light and belongings are in reach. Fall wristband applied and present on pts wrist.  Bed alarm on.  Pt encouraged to call for assistance. Will continue with hourly rounds for PO intake, pain needs, toileting and repositioning as needed.        Problem: Pain:  Goal: Pain level will decrease  Description  Pain level will decrease  04/19/2018 0454 by Donella Stade, RN  Outcome: Ongoing  Note:   Patient c/o back pain. PRN morphine 4mg  administered twice so far this shift. Upon reassessment, pt was asleep with RR greater than 10. Will continue to monitor.      Problem: Risk for Impaired Skin Integrity  Goal: Tissue integrity - skin and mucous membranes  Description  Structural intactness and normal physiological function of skin and  mucous membranes.  04/19/2018 0454 by Donella Stade, RN  Outcome: Ongoing  Note:   No areas of skin breakdown noted, will continue to monitor.      Problem: Bleeding:  Goal: Will show no signs and symptoms of excessive bleeding  Description  Will show no signs and symptoms of excessive bleeding  Outcome: Ongoing  Note:   Patient's hemoglobin this AM:   Recent Labs     04/19/18  0352   HGB 7.1*     Patient's platelet count this AM:   Recent Labs     04/19/18  0352   PLT 54*    Thrombocytopenia Precautions in place.  Patient showing no signs or symptoms of active bleeding.  Patient will receive PRBC transfusion later this AM.   Patient verbalizes understanding of all instructions. Will continue to assess and implement POC. Call light within reach and hourly  rounding in place.

## 2018-04-19 NOTE — Progress Notes (Signed)
One time dose of morphine given at this time. deacessed pts port. Tolerated well.   Discharge teaching and instructions for diagnosis of multiple myeloma completed with patient using teachback method. AVS reviewed. Medications, dosages, schedule, and potential side effects reviewed with patient. Patient voiced understanding regarding prescriptions, follow up appointments, and care of self at home. Discharged via cart/stretcher to  home with support per family    Verified appropriate follow up appointments scheduled & pt instructed on how to schedule appts.  Diet & activity discussed.    Patient verbalized understanding and questions were answered to his satisfaction.  Signed discharge instructions were given to the patient and a copy placed in the paper-lite chart.      Regis Bill

## 2018-04-19 NOTE — Progress Notes (Signed)
VSS. Assessment completed. Scheduled meds given whole with water. Pt resting comfortably in bed. Call light within reach. Girlfriend at bedside. Denies further needs at this time. Will continue to monitor. Electronically signed by Regis Bill, RN on 04/19/2018 at 11:40 AM

## 2018-04-19 NOTE — Care Coordination-Inpatient (Signed)
Social Work Discharge Note    Patient and SO met with [facility] today.  Consents signed.  Patient wished to complete Advanced Directives with SO listed as first.  They had completed paperwork, SW and RN witnessed signatures.  Paperwork from chart given to Hospice RN.  [facility] signed by physician.  Patient will be picked up at 2:00 pm set up by hospice RN for patient to return home.    No further SW intervention needed.    Freddi Starr, MSW, [place]

## 2018-04-19 NOTE — Plan of Care (Signed)
Problem: Falls - Risk of:  Goal: Will remain free from falls  Description  Will remain free from falls  04/19/2018 1449 by Regis Bill, RN  Outcome: Completed     Problem: Falls - Risk of:  Goal: Absence of physical injury  Description  Absence of physical injury  Outcome: Completed     Problem: Pain:  Goal: Pain level will decrease  Description  Pain level will decrease  04/19/2018 1449 by Regis Bill, RN  Outcome: Completed     Problem: Pain:  Goal: Control of acute pain  Description  Control of acute pain  Outcome: Completed     Problem: Pain:  Goal: Control of chronic pain  Description  Control of chronic pain  Outcome: Completed     Problem: Risk for Impaired Skin Integrity  Goal: Tissue integrity - skin and mucous membranes  Description  Structural intactness and normal physiological function of skin and  mucous membranes.  04/19/2018 1449 by Regis Bill, RN  Outcome: Completed     Problem: Bleeding:  Goal: Will show no signs and symptoms of excessive bleeding  Description  Will show no signs and symptoms of excessive bleeding  04/19/2018 1449 by Regis Bill, RN  Outcome: Completed

## 2018-04-20 LAB — 1,3 BETA-D-GLUCAN: (1,3)-Beta-D-Glucan (Fungitell) Interpretation: UNDETERMINED — AB

## 2018-04-20 LAB — CULTURE, VRE: VRE Culture: NEGATIVE

## 2018-04-23 LAB — CULTURE, BLOOD 2: Culture, Blood 2: NO GROWTH

## 2018-04-23 LAB — CULTURE BLOOD #1: Blood Culture, Routine: NO GROWTH

## 2018-05-01 DEATH — deceased

## 2018-06-02 ENCOUNTER — Encounter: Payer: BLUE CROSS/BLUE SHIELD | Attending: Pulmonary Disease | Primary: Hematology & Oncology

## 2018-06-02 NOTE — Telephone Encounter (Signed)
Called to confirm appt with [facility]. Spoke to Robbins who told me that patient passed away May 20, 2018.

## 2019-03-18 ENCOUNTER — Emergency Department (HOSPITAL_COMMUNITY)
Admission: EM | Admit: 2019-03-18 | Discharge: 2019-03-18 | Disposition: A | Discharge status: Home / Self Care | Attending: Emergency Medicine | Admitting: Emergency Medicine

## 2019-03-18 ENCOUNTER — Encounter (HOSPITAL_COMMUNITY): Payer: Self-pay

## 2019-03-18 ENCOUNTER — Emergency Department (HOSPITAL_COMMUNITY)

## 2019-03-18 ENCOUNTER — Other Ambulatory Visit: Payer: Self-pay

## 2019-03-18 DIAGNOSIS — W1839XA Other fall on same level, initial encounter: Secondary | ICD-10-CM | POA: Diagnosis not present

## 2019-03-18 DIAGNOSIS — Y939 Activity, unspecified: Secondary | ICD-10-CM | POA: Diagnosis not present

## 2019-03-18 DIAGNOSIS — S199XXA Unspecified injury of neck, initial encounter: Secondary | ICD-10-CM | POA: Diagnosis present

## 2019-03-18 DIAGNOSIS — S12120A Other displaced dens fracture, initial encounter for closed fracture: Secondary | ICD-10-CM | POA: Diagnosis not present

## 2019-03-18 DIAGNOSIS — Y999 Unspecified external cause status: Secondary | ICD-10-CM | POA: Diagnosis not present

## 2019-03-18 DIAGNOSIS — Z87891 Personal history of nicotine dependence: Secondary | ICD-10-CM | POA: Diagnosis not present

## 2019-03-18 DIAGNOSIS — S12121A Other nondisplaced dens fracture, initial encounter for closed fracture: Secondary | ICD-10-CM

## 2019-03-18 DIAGNOSIS — R202 Paresthesia of skin: Secondary | ICD-10-CM | POA: Insufficient documentation

## 2019-03-18 DIAGNOSIS — Y92149 Unspecified place in prison as the place of occurrence of the external cause: Secondary | ICD-10-CM | POA: Diagnosis not present

## 2019-03-18 HISTORY — DX: Unspecified asthma, uncomplicated: J45.909

## 2019-03-18 HISTORY — DX: Malignant (primary) neoplasm, unspecified: C80.1

## 2019-03-18 HISTORY — DX: Atherosclerotic heart disease of native coronary artery without angina pectoris: I25.10

## 2019-03-18 HISTORY — DX: Essential (primary) hypertension: I10

## 2019-03-18 HISTORY — DX: Unspecified osteoarthritis, unspecified site: M19.90

## 2019-03-18 LAB — BASIC METABOLIC PANEL
Anion gap: 5 (ref 5–15)
BUN: 7 mg/dL — ABNORMAL LOW (ref 8–23)
CO2: 26 mmol/L (ref 22–32)
Calcium: 9.1 mg/dL (ref 8.9–10.3)
Chloride: 106 mmol/L (ref 98–111)
Creatinine, Ser: 0.85 mg/dL (ref 0.61–1.24)
GFR calc Af Amer: >60 mL/min (ref >60)
GFR calc non Af Amer: >60 mL/min (ref >60)
Glucose, Bld: 93 mg/dL (ref 70–99)
Potassium: 4.5 mmol/L (ref 3.5–5.1)
Sodium: 137 mmol/L (ref 135–145)

## 2019-03-18 LAB — CBC WITH DIFFERENTIAL/PLATELET
Abs Immature Granulocytes: 0.01 10*3/uL (ref 0.00–0.07)
Basophils Absolute: 0 10*3/uL (ref 0.0–0.1)
Basophils Relative: 1 %
Eosinophils Absolute: 0.1 10*3/uL (ref 0.0–0.5)
Eosinophils Relative: 1 %
HCT: 39.1 % (ref 39.0–52.0)
Hemoglobin: 12.4 g/dL — ABNORMAL LOW (ref 13.0–17.0)
Immature Granulocytes: 0 %
Lymphocytes Relative: 29 %
Lymphs Abs: 1.7 10*3/uL (ref 0.7–4.0)
MCH: 23.7 pg — ABNORMAL LOW (ref 26.0–34.0)
MCHC: 31.7 g/dL (ref 30.0–36.0)
MCV: 74.6 fL — ABNORMAL LOW (ref 80.0–100.0)
Monocytes Absolute: 0.4 10*3/uL (ref 0.1–1.0)
Monocytes Relative: 6 %
Neutro Abs: 3.6 10*3/uL (ref 1.7–7.7)
Neutrophils Relative %: 63 %
Platelets: 262 10*3/uL (ref 150–400)
RBC: 5.24 MIL/uL (ref 4.22–5.81)
RDW: 16.3 % — ABNORMAL HIGH (ref 11.5–15.5)
WBC: 5.8 10*3/uL (ref 4.0–10.5)
nRBC: 0 % (ref 0.0–0.2)

## 2019-03-18 LAB — CBG MONITORING, ED: Glucose-Capillary: 85 mg/dL (ref 70–99)

## 2019-03-18 NOTE — ED Notes (Signed)
Patient transported to CT 

## 2019-03-18 NOTE — ED Notes (Signed)
Designer, industrial/product at bedside.

## 2019-03-18 NOTE — ED Notes (Signed)
Patient verbalizes understanding of discharge instructions. Opportunity for questioning and answers were provided. Armband removed by staff, pt discharged from ED. Pt released with CO.

## 2019-03-18 NOTE — ED Provider Notes (Signed)
[facility] EMERGENCY DEPARTMENT Provider Note   CSN: [id number] Arrival date & time: 03/18/19  0807     History   Chief Complaint Chief Complaint  Patient presents with   Syncable Episode   C2 Fracture    HPI Aundrea Higginbotham is a 63 y.o. male.  He has a history of hypertension high cholesterol depression.  He is currently in prison and he said he had a syncopal event on July 4 in which he fell forward and struck his forehead.  He reportedly went to [facility] where they evaluated him and discharged him back to prison.  He is complained of head and neck pain since then and for some reason today it was identified that he may have a C2 fracture and they requested transfer here for evaluation.  Patient complains of moderate head and neck pain increased with any kind of movement and also some tingling in his right hand fingers 2345 that radiates up to his elbow.  Its not associate with any weakness.  He denies any other numbness or weakness and no chest pain abdominal pain.  No fevers.     The history is provided by the patient.  Fall This is a new problem. The current episode started more than 1 week ago. The problem occurs constantly. The problem has not changed since onset.Associated symptoms include headaches. Pertinent negatives include no chest pain, no abdominal pain and no shortness of breath. The symptoms are aggravated by bending and twisting. Nothing relieves the symptoms. He has tried nothing for the symptoms. The treatment provided no relief.    No past medical history on file.  There are no active problems to display for this patient.   History reviewed. No pertinent surgical history.      Home Medications    Prior to Admission medications   Not on File    Family History No family history on file.  Social History Social History   Tobacco Use   Smoking status: Former Smoker   Smokeless tobacco: Never Used  Substance Use Topics    Alcohol use: Not on file   Drug use: Not on file     Allergies   Patient has no allergy information on record.   Review of Systems Review of Systems  Constitutional: Negative for fever.  HENT: Negative for sore throat.   Eyes: Negative for visual disturbance.  Respiratory: Negative for shortness of breath.   Cardiovascular: Negative for chest pain.  Gastrointestinal: Negative for abdominal pain.  Genitourinary: Negative for dysuria.  Musculoskeletal: Positive for neck pain. Negative for back pain.  Skin: Negative for rash.  Neurological: Positive for numbness and headaches. Negative for weakness.     Physical Exam Updated Vital Signs BP 137/78 (BP Location: Right Arm)    Pulse (!) 59    Temp 98.1 F (36.7 C) (Oral)    Resp 16    Ht 5' 10.5" (1.791 m)    Wt 81.2 kg    SpO2 99%    BMI 25.32 kg/m   Physical Exam Vitals signs and nursing note reviewed.  Constitutional:      Appearance: He is well-developed.  HENT:     Head: Normocephalic and atraumatic.  Eyes:     Conjunctiva/sclera: Conjunctivae normal.  Neck:     Comments: Patient in cervical collar trach midline normal voice. Cardiovascular:     Rate and Rhythm: Normal rate and regular rhythm.     Heart sounds: No murmur.  Pulmonary:  Effort: Pulmonary effort is normal. No respiratory distress.     Breath sounds: Normal breath sounds.  Abdominal:     Palpations: Abdomen is soft.     Tenderness: There is no abdominal tenderness.  Musculoskeletal:     Right lower leg: No edema.     Left lower leg: No edema.  Skin:    General: Skin is warm and dry.     Capillary Refill: Capillary refill takes less than 2 seconds.  Neurological:     Mental Status: He is alert.     Cranial Nerves: No cranial nerve deficit.     Sensory: Sensory deficit (decreased sensation right 2345 digits) present.     Motor: No weakness.      ED Treatments / Results  Labs (all labs ordered are listed, but only abnormal results are  displayed) Labs Reviewed  BASIC METABOLIC PANEL - Abnormal; Notable for the following components:      Result Value   BUN 7 (*)    All other components within normal limits  CBC WITH DIFFERENTIAL/PLATELET - Abnormal; Notable for the following components:   Hemoglobin 12.4 (*)    MCV 74.6 (*)    MCH 23.7 (*)    RDW 16.3 (*)    All other components within normal limits  CBG MONITORING, ED    EKG EKG Interpretation  Date/Time:  Saturday March 18 2019 08:43:21 EDT Ventricular Rate:  55 PR Interval:    QRS Duration: 98 QT Interval:  435 QTC Calculation: 416 R Axis:   -32 Text Interpretation:  Sinus rhythm Left axis deviation Consider anterior infarct no prior to compare with Confirmed by Aletta Edouard [phone]) on 03/18/2019 9:00:52 AM   Radiology Ct Head Wo Contrast  Result Date: 03/18/2019 CLINICAL DATA:  Syncopal episode on July 4th, reported C2 fracture at ran off hospital. Continued neck pain and tingling in RIGHT hand and fingers. EXAM: CT HEAD WITHOUT CONTRAST CT CERVICAL SPINE WITHOUT CONTRAST TECHNIQUE: Multidetector CT imaging of the head and cervical spine was performed following the standard protocol without intravenous contrast. Multiplanar CT image reconstructions of the cervical spine were also generated. COMPARISON:  Head CT and cervical spine CT dated 03/10/2019. FINDINGS: CT HEAD FINDINGS Brain: Ventricles are normal in size and configuration. There is no mass, hemorrhage, edema or other evidence of acute parenchymal abnormality. No extra-axial hemorrhage. Vascular: No hyperdense vessel or unexpected calcification. Skull: Normal. Negative for fracture or focal lesion. Sinuses/Orbits: No acute finding. Other: None. CT CERVICAL SPINE FINDINGS Alignment: Stable alignment. Skull base and vertebrae: Minimally displaced type 2 odontoid process fracture, without significant change in alignment. No new fracture. Facet joints are normally aligned throughout. Soft tissues and spinal  canal: No acute findings. Disc levels: Advanced degenerative spondylosis of the cervical spine, with associated osseous fusion at the C4-5 level, associated disc space narrowing and large osteophyte formation. Associated central canal stenoses at multiple levels, moderate in degree, stable. Degenerative uncovertebral and facet joint hypertrophy causing mild to moderate neural foramen narrowings bilaterally, most prominent on the RIGHT at the C6-7 level, with possible associated nerve root impingement. Upper chest: No acute findings. Emphysematous changes at the lung apices. Other: Bilateral carotid atherosclerosis. IMPRESSION: 1. No acute intracranial abnormality. No intracranial mass, hemorrhage or edema. No skull fracture. 2. Minimally displaced type 2 odontoid process fracture, without significant change in alignment compared to the recent cervical spine CT of 03/10/2019. Overall alignment of the cervical spine is stable. 3. Advanced degenerative spondylosis of the cervical spine, as  detailed above. Possible associated nerve root impingement at multiple levels, most prominent neural foramen narrowing on the RIGHT at the C6-7 level. 4. Carotid atherosclerosis. Electronically Signed   By: Franki Cabot M.D.   On: 03/18/2019 10:05   Ct Cervical Spine Wo Contrast  Result Date: 03/18/2019 CLINICAL DATA:  Syncopal episode on July 4th, reported C2 fracture at ran off hospital. Continued neck pain and tingling in RIGHT hand and fingers. EXAM: CT HEAD WITHOUT CONTRAST CT CERVICAL SPINE WITHOUT CONTRAST TECHNIQUE: Multidetector CT imaging of the head and cervical spine was performed following the standard protocol without intravenous contrast. Multiplanar CT image reconstructions of the cervical spine were also generated. COMPARISON:  Head CT and cervical spine CT dated 03/10/2019. FINDINGS: CT HEAD FINDINGS Brain: Ventricles are normal in size and configuration. There is no mass, hemorrhage, edema or other evidence of  acute parenchymal abnormality. No extra-axial hemorrhage. Vascular: No hyperdense vessel or unexpected calcification. Skull: Normal. Negative for fracture or focal lesion. Sinuses/Orbits: No acute finding. Other: None. CT CERVICAL SPINE FINDINGS Alignment: Stable alignment. Skull base and vertebrae: Minimally displaced type 2 odontoid process fracture, without significant change in alignment. No new fracture. Facet joints are normally aligned throughout. Soft tissues and spinal canal: No acute findings. Disc levels: Advanced degenerative spondylosis of the cervical spine, with associated osseous fusion at the C4-5 level, associated disc space narrowing and large osteophyte formation. Associated central canal stenoses at multiple levels, moderate in degree, stable. Degenerative uncovertebral and facet joint hypertrophy causing mild to moderate neural foramen narrowings bilaterally, most prominent on the RIGHT at the C6-7 level, with possible associated nerve root impingement. Upper chest: No acute findings. Emphysematous changes at the lung apices. Other: Bilateral carotid atherosclerosis. IMPRESSION: 1. No acute intracranial abnormality. No intracranial mass, hemorrhage or edema. No skull fracture. 2. Minimally displaced type 2 odontoid process fracture, without significant change in alignment compared to the recent cervical spine CT of 03/10/2019. Overall alignment of the cervical spine is stable. 3. Advanced degenerative spondylosis of the cervical spine, as detailed above. Possible associated nerve root impingement at multiple levels, most prominent neural foramen narrowing on the RIGHT at the C6-7 level. 4. Carotid atherosclerosis. Electronically Signed   By: Franki Cabot M.D.   On: 03/18/2019 10:05    Procedures Procedures (including critical care time)  Medications Ordered in ED Medications - No data to display   Initial Impression / Assessment and Plan / ED Course  I have reviewed the triage vital  signs and the nursing notes.  Pertinent labs & imaging results that were available during my care of the patient were reviewed by me and considered in my medical decision making (see chart for details).  Clinical Course as of Mar 19 543  Sat Mar 17, 3149  5469 63 year old male here with concern for cervical fracture after fall 2 weeks ago.  He is neuro intact although he does have some subjective decrease sensation in his right hand.  Differential includes cervical fracture, cord injury, cord hematoma, radiculopathy   [MB]  0930 Reviewed patient's CT of his head and cervical spine.  There is anything obvious on the CT brain but he does appear to have a nondisplaced C2 fracture on his C-spine.  Awaiting radiology readings.   [MB]  0350 CTs are being read as a nondisplaced C2 fracture.  They also comment upon some foraminal narrowing in C6-C7 area on the right.   [MB]  1024 Discussed with neurosurgery PA Meyran.  She said he can  be in an [place] collar at all times and follow-up with Dr. Saintclair Halsted in the office this week.   [MB]    Clinical Course User Index [MB] Hayden Rasmussen, MD         Final Clinical Impressions(s) / ED Diagnoses   Final diagnoses:  Other closed nondisplaced odontoid fracture, initial encounter [facility])  Right hand paresthesia    ED Discharge Orders    None       Hayden Rasmussen, MD 03/19/19 [phone]

## 2019-03-18 NOTE — ED Triage Notes (Signed)
Pt had a syncable episode while in the [place] on July 4th & was sent to [facility] for evaluation. The CT that was performed was reviewed at the prison by the physician today & was noted that he has a C2 fracture, he has been sent here for re-evaluation of the fracture as well as when he had the syncable episode. Pt reports that he has been having some pain in his neck & some tingling in his Rt fingers, also reports that he has been doing light duty in the yard in the flower beds, arrived to ED with C-collar in place.

## 2019-03-18 NOTE — ED Notes (Signed)
Pt with corrections officer waiting for corrections transportation. CO escorted pt to restroom.

## 2019-03-18 NOTE — Discharge Instructions (Addendum)
You were seen in the emergency department for continued neck and head pain along with some decrease sensation in your right hand.  You had a CAT scan of your head that did not show any obvious bleeding or signs of stroke.  The CAT scan of your cervical spine showed a nondisplaced C2 fracture.  Neurosurgery is recommending a hard collar at all times until you follow-up with them this week.  Please call Dr. Windy Carina office for follow-up.  Tylenol for pain.

## 2019-03-19 ENCOUNTER — Encounter (HOSPITAL_COMMUNITY): Payer: Self-pay | Admitting: Emergency Medicine

## 2019-03-31 DIAGNOSIS — I361 Nonrheumatic tricuspid (valve) insufficiency: Secondary | ICD-10-CM

## 2020-09-04 IMAGING — CT CT HEAD WITHOUT CONTRAST
4 of 8 series · 16 of 47 positions shown, 18 images · non-contrast
Comparison: Head CT and cervical spine CT dated [DATE].

CLINICAL DATA: Syncopal episode on [REDACTED], reported C2 fracture
at ran off hospital. Continued neck pain and tingling in RIGHT hand
and fingers.

EXAM:
CT HEAD WITHOUT CONTRAST
CT CERVICAL SPINE WITHOUT CONTRAST
TECHNIQUE: Multidetector CT imaging of the head and cervical spine was
performed following the standard protocol without intravenous
contrast. Multiplanar CT image reconstructions of the cervical spine
were also generated.

[Series 5: orthogonal axials · axial · 0.21mm/px · z∈[-294,-149]mm · 7 of 107 slices shown, 9 images]
[im 14/107  brain]
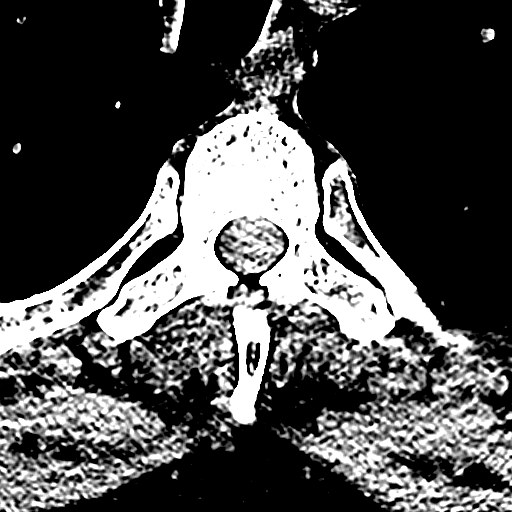
[im 14/107  bone]
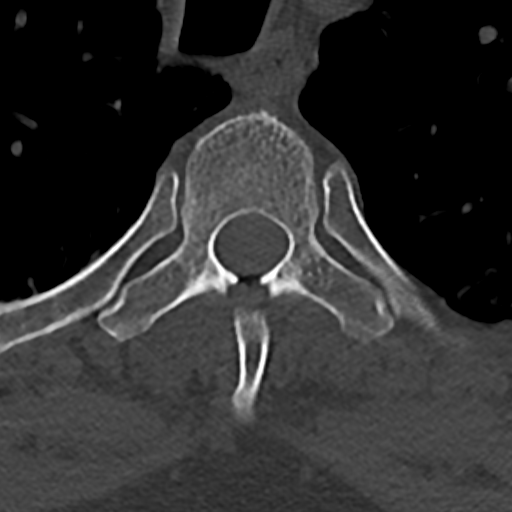
[im 27/107  brain]
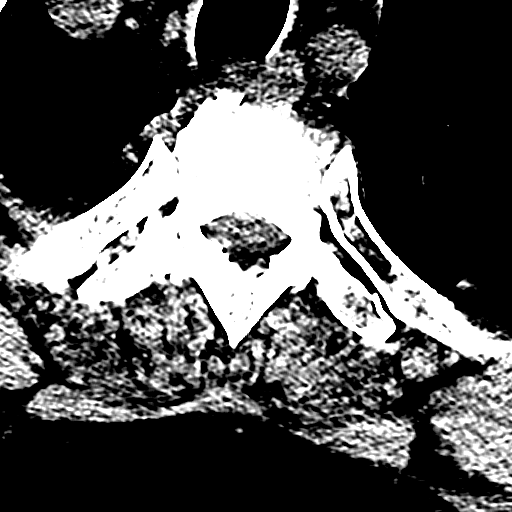
[im 40/107  brain]
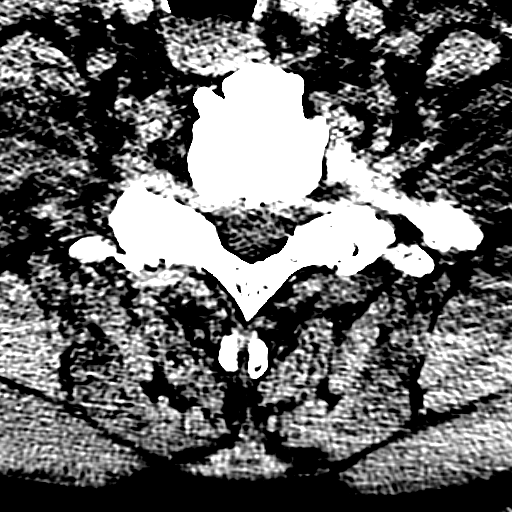
[im 54/107  brain]
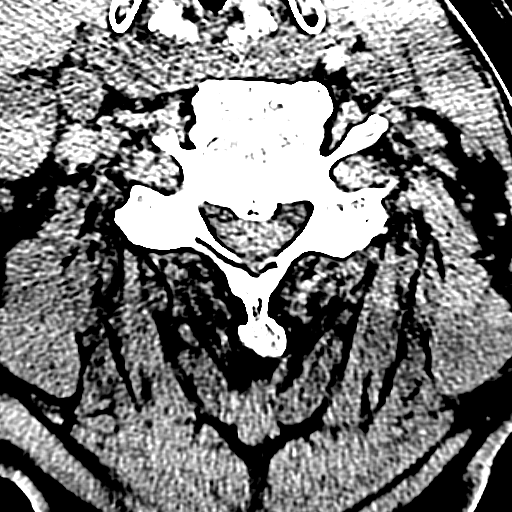
[im 67/107  brain]
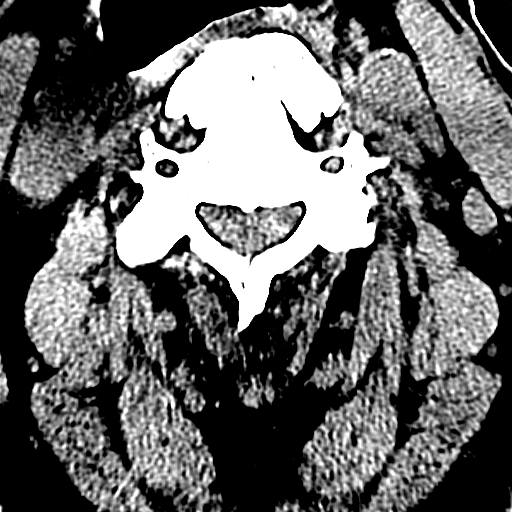
[im 67/107  bone]
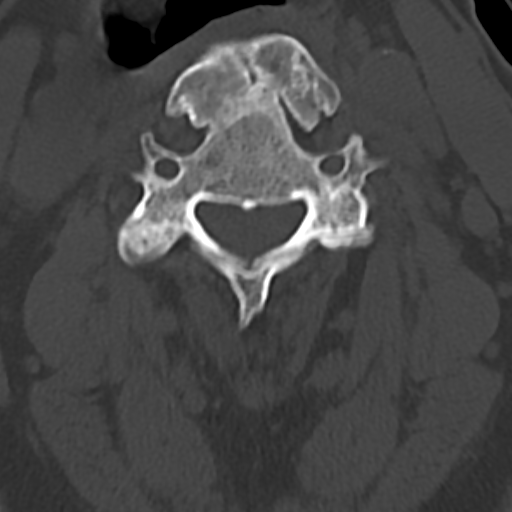
[im 80/107  brain]
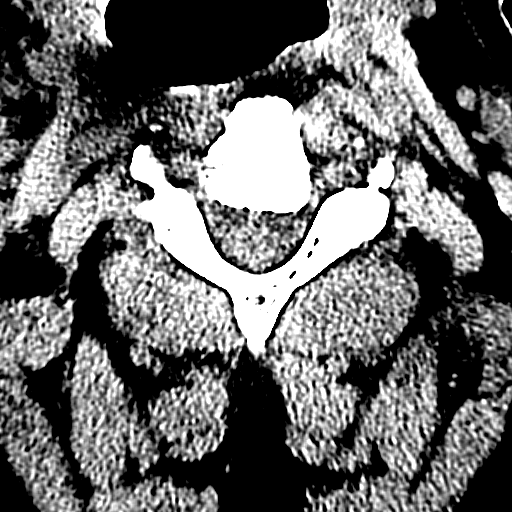
[im 93/107  brain]
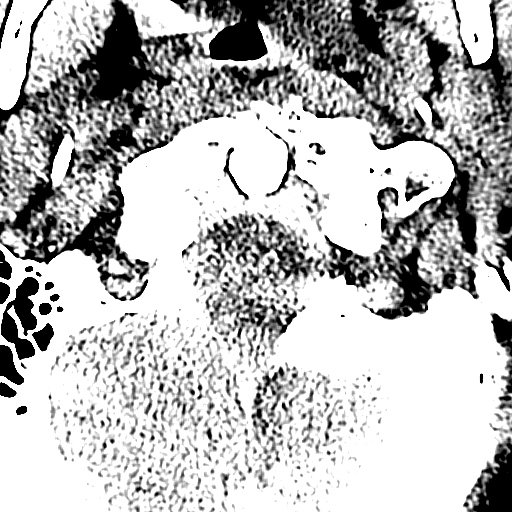

[Series 7: head bone · axial · 0.50mm/px · z∈[-130,-36]mm · 5 of 83 slices shown]
[im 12/83  bone]
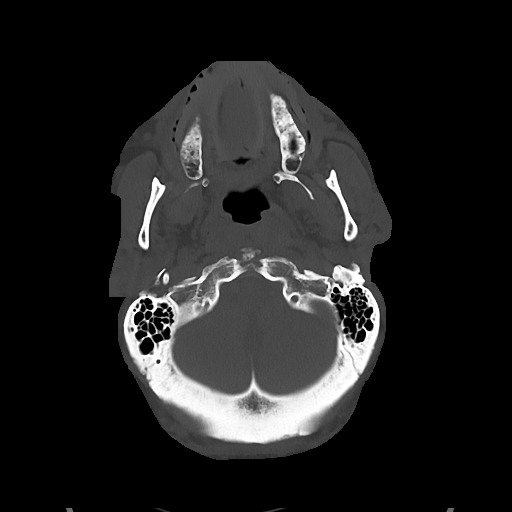
[im 24/83  bone]
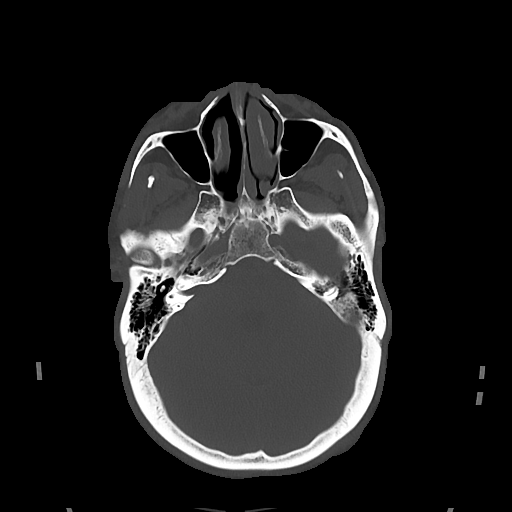
[im 36/83  bone]
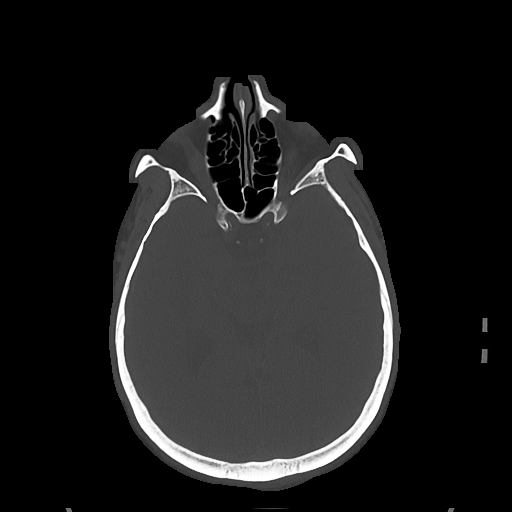
[im 47/83  bone]
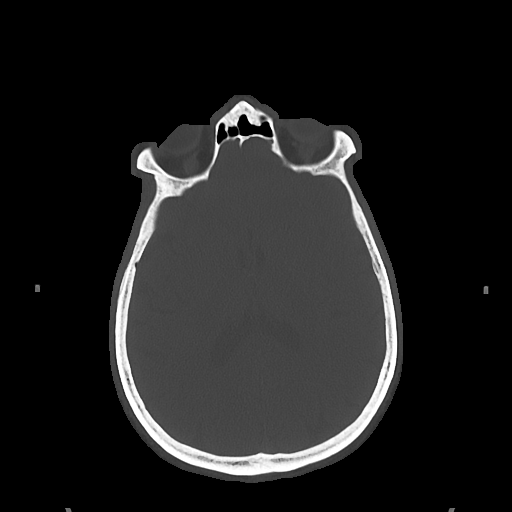
[im 59/83  bone]
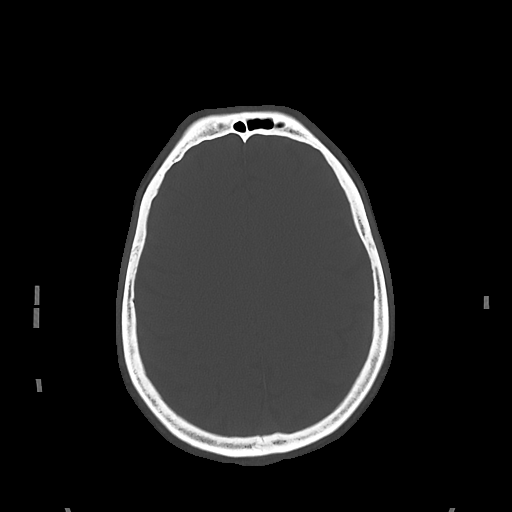

[Series 8: cor soft · coronal · 0.32mm/px · 3 of 75 slices shown]
[im 25/75  brain]
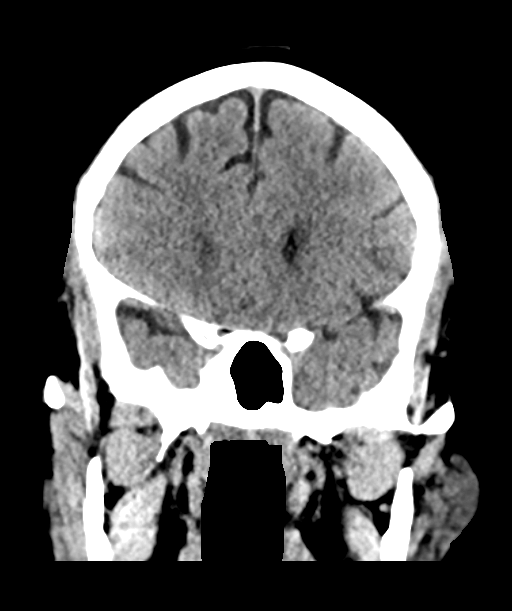
[im 38/75  brain]
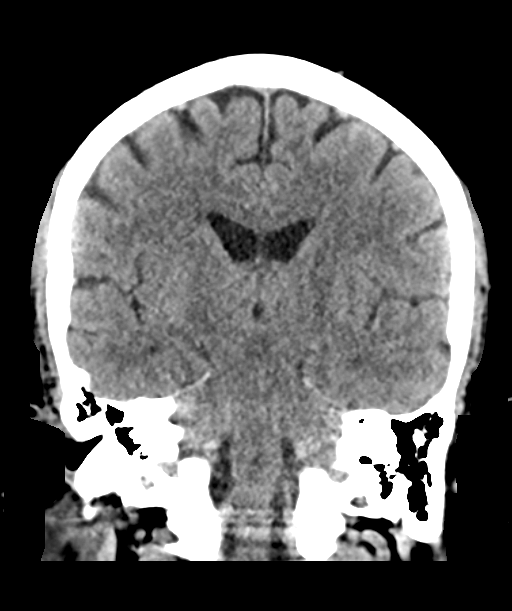
[im 50/75  brain]
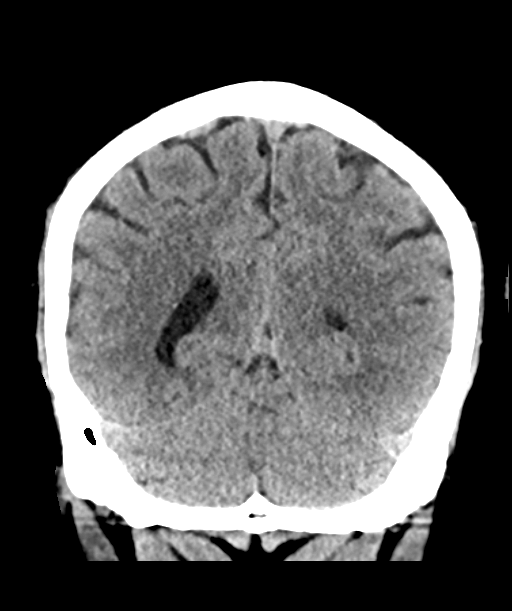

[Series 9: sag soft · sagittal · 0.40mm/px · 1 of 58 slices shown]
[im 29/58  brain]
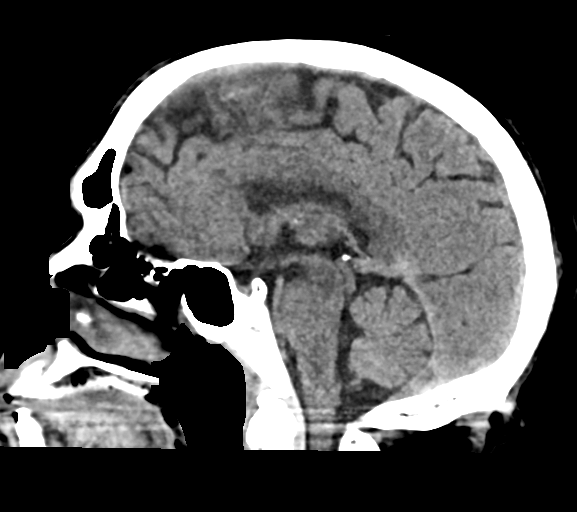

[16 of 47 positions shown; findings below may reference images not displayed]

FINDINGS: CT HEAD FINDINGS

Brain: Ventricles are normal in size and configuration. There is no
mass, hemorrhage, edema or other evidence of acute parenchymal
abnormality. No extra-axial hemorrhage.

Vascular: No hyperdense vessel or unexpected calcification.

Skull: Normal. Negative for fracture or focal lesion.

Sinuses/Orbits: No acute finding.

Other: None.

CT CERVICAL SPINE FINDINGS

Alignment: Stable alignment.

Skull base and vertebrae: Minimally displaced type 2 odontoid
process fracture, without significant change in alignment.

No new fracture. Facet joints are normally aligned throughout.

Soft tissues and spinal canal: No acute findings.

Disc levels: Advanced degenerative spondylosis of the cervical
spine, with associated osseous fusion at the C4-5 level, associated
disc space narrowing and large osteophyte formation. Associated
central canal stenoses at multiple levels, moderate in degree,
stable.

Degenerative uncovertebral and facet joint hypertrophy causing mild
to moderate neural foramen narrowings bilaterally, most prominent on
the RIGHT at the C6-7 level, with possible associated nerve root
impingement.

Upper chest: No acute findings. Emphysematous changes at the lung
apices.

Other: Bilateral carotid atherosclerosis.
IMPRESSION: 1. No acute intracranial abnormality. No intracranial mass,
hemorrhage or edema. No skull fracture.
2. Minimally displaced type 2 odontoid process fracture, without
significant change in alignment compared to the recent cervical
spine CT of 03/10/2019. Overall alignment of the cervical spine is
stable.
3. Advanced degenerative spondylosis of the cervical spine, as
detailed above. Possible associated nerve root impingement at
multiple levels, most prominent neural foramen narrowing on the
RIGHT at the C6-7 level.
4. Carotid atherosclerosis.

## 2024-03-31 DEATH — deceased
